# Patient Record
Sex: Male | Born: 1955 | Race: White | Hispanic: No | Smoking: Former smoker
Health system: Southern US, Community
[De-identification: ages and names within clinical notes are randomized; demographics above are authoritative.]

## PROBLEM LIST (undated history)

## (undated) DIAGNOSIS — Z7982 Long term (current) use of aspirin: Secondary | ICD-10-CM

## (undated) DIAGNOSIS — T7840XA Allergy, unspecified, initial encounter: Secondary | ICD-10-CM

## (undated) DIAGNOSIS — I471 Supraventricular tachycardia, unspecified: Secondary | ICD-10-CM

## (undated) DIAGNOSIS — I10 Essential (primary) hypertension: Secondary | ICD-10-CM

## (undated) DIAGNOSIS — K219 Gastro-esophageal reflux disease without esophagitis: Secondary | ICD-10-CM

## (undated) DIAGNOSIS — M7671 Peroneal tendinitis, right leg: Secondary | ICD-10-CM

## (undated) DIAGNOSIS — I2510 Atherosclerotic heart disease of native coronary artery without angina pectoris: Secondary | ICD-10-CM

## (undated) DIAGNOSIS — I700 Atherosclerosis of aorta: Secondary | ICD-10-CM

## (undated) DIAGNOSIS — M5136 Other intervertebral disc degeneration, lumbar region: Secondary | ICD-10-CM

## (undated) DIAGNOSIS — G4733 Obstructive sleep apnea (adult) (pediatric): Secondary | ICD-10-CM

## (undated) DIAGNOSIS — I5030 Unspecified diastolic (congestive) heart failure: Secondary | ICD-10-CM

## (undated) DIAGNOSIS — M1990 Unspecified osteoarthritis, unspecified site: Secondary | ICD-10-CM

## (undated) DIAGNOSIS — G4730 Sleep apnea, unspecified: Secondary | ICD-10-CM

## (undated) DIAGNOSIS — N400 Enlarged prostate without lower urinary tract symptoms: Secondary | ICD-10-CM

## (undated) DIAGNOSIS — M961 Postlaminectomy syndrome, not elsewhere classified: Secondary | ICD-10-CM

## (undated) DIAGNOSIS — E119 Type 2 diabetes mellitus without complications: Secondary | ICD-10-CM

## (undated) DIAGNOSIS — M21372 Foot drop, left foot: Secondary | ICD-10-CM

## (undated) DIAGNOSIS — J449 Chronic obstructive pulmonary disease, unspecified: Secondary | ICD-10-CM

## (undated) DIAGNOSIS — R0600 Dyspnea, unspecified: Secondary | ICD-10-CM

## (undated) DIAGNOSIS — M51369 Other intervertebral disc degeneration, lumbar region without mention of lumbar back pain or lower extremity pain: Secondary | ICD-10-CM

## (undated) DIAGNOSIS — I209 Angina pectoris, unspecified: Secondary | ICD-10-CM

## (undated) DIAGNOSIS — F419 Anxiety disorder, unspecified: Secondary | ICD-10-CM

## (undated) DIAGNOSIS — E7800 Pure hypercholesterolemia, unspecified: Secondary | ICD-10-CM

## (undated) DIAGNOSIS — I509 Heart failure, unspecified: Secondary | ICD-10-CM

## (undated) DIAGNOSIS — G709 Myoneural disorder, unspecified: Secondary | ICD-10-CM

## (undated) DIAGNOSIS — Z951 Presence of aortocoronary bypass graft: Secondary | ICD-10-CM

## (undated) HISTORY — DX: Atherosclerotic heart disease of native coronary artery without angina pectoris: I25.10

## (undated) HISTORY — PX: CARDIAC CATHETERIZATION: SHX172

## (undated) HISTORY — PX: FRACTURE SURGERY: SHX138

## (undated) HISTORY — PX: CHOLECYSTECTOMY: SHX55

## (undated) HISTORY — PX: APPENDECTOMY: SHX54

## (undated) HISTORY — DX: Chronic obstructive pulmonary disease, unspecified: J44.9

## (undated) HISTORY — DX: Unspecified osteoarthritis, unspecified site: M19.90

## (undated) HISTORY — DX: Heart failure, unspecified: I50.9

## (undated) HISTORY — PX: LAMINECTOMY: SHX219

## (undated) HISTORY — PX: KNEE SURGERY: SHX244

## (undated) HISTORY — PX: CARPAL TUNNEL RELEASE: SHX101

## (undated) HISTORY — PX: VASECTOMY: SHX75

## (undated) HISTORY — PX: OTHER SURGICAL HISTORY: SHX169

## (undated) HISTORY — PX: SPINE SURGERY: SHX786

## (undated) HISTORY — DX: Myoneural disorder, unspecified: G70.9

## (undated) HISTORY — DX: Allergy, unspecified, initial encounter: T78.40XA

## (undated) HISTORY — DX: Gastro-esophageal reflux disease without esophagitis: K21.9

## (undated) HISTORY — DX: Sleep apnea, unspecified: G47.30

## (undated) HISTORY — DX: Type 2 diabetes mellitus without complications: E11.9

---

## 2008-06-15 DIAGNOSIS — I2510 Atherosclerotic heart disease of native coronary artery without angina pectoris: Secondary | ICD-10-CM

## 2008-06-15 HISTORY — DX: Atherosclerotic heart disease of native coronary artery without angina pectoris: I25.10

## 2008-06-15 HISTORY — PX: CORONARY STENT PLACEMENT: SHX1402

## 2014-06-15 DIAGNOSIS — I214 Non-ST elevation (NSTEMI) myocardial infarction: Secondary | ICD-10-CM

## 2014-06-15 HISTORY — DX: Non-ST elevation (NSTEMI) myocardial infarction: I21.4

## 2015-04-21 DIAGNOSIS — Z951 Presence of aortocoronary bypass graft: Secondary | ICD-10-CM

## 2015-04-21 HISTORY — PX: CORONARY ARTERY BYPASS GRAFT: SHX141

## 2015-04-21 HISTORY — DX: Presence of aortocoronary bypass graft: Z95.1

## 2015-05-30 ENCOUNTER — Encounter: Payer: Self-pay | Admitting: Emergency Medicine

## 2015-05-30 ENCOUNTER — Emergency Department
Admission: EM | Admit: 2015-05-30 | Discharge: 2015-05-31 | Disposition: A | Discharge status: Home / Self Care | Attending: Emergency Medicine | Admitting: Emergency Medicine

## 2015-05-30 DIAGNOSIS — R202 Paresthesia of skin: Secondary | ICD-10-CM | POA: Insufficient documentation

## 2015-05-30 DIAGNOSIS — Z87891 Personal history of nicotine dependence: Secondary | ICD-10-CM | POA: Insufficient documentation

## 2015-05-30 DIAGNOSIS — I10 Essential (primary) hypertension: Secondary | ICD-10-CM | POA: Insufficient documentation

## 2015-05-30 HISTORY — DX: Pure hypercholesterolemia, unspecified: E78.00

## 2015-05-30 HISTORY — DX: Essential (primary) hypertension: I10

## 2015-05-30 HISTORY — DX: Presence of aortocoronary bypass graft: Z95.1

## 2015-05-30 NOTE — ED Notes (Signed)
Pt had bypass 6 weeks ago and had vein grafted from right leg.  For 3 weeks has had numbness to right leg from right groin to right knee.

## 2015-05-30 NOTE — ED Notes (Signed)
MD Brown at bedside.

## 2015-05-30 NOTE — ED Provider Notes (Signed)
[facility] Emergency Department Provider Note  ____________________________________________  Time seen: 11:20 PM  I have reviewed the triage vital signs and the nursing notes.   HISTORY  Chief Complaint Numbness      HPI Jonathon Newman is a 59 y.o. male presents with history of coronary artery bypass graft with right saphenous vein harvesting proximally 6 weeks ago. Patient states for the past 3 weeks she's had numbness and tingling sensation to his right lateral/distal thigh extending to groin. Patient denies any swelling.     Past Medical History  Diagnosis Date  . Hypertension   . Hypercholesteremia   . S/P coronary artery bypass graft x 2     There are no active problems to display for this patient.   Past Surgical History  Procedure Laterality Date  . Cardiac catheterization    . Appendectomy    . Cholecystectomy    . Coronary stent placement    . Knee surgery      No current outpatient prescriptions on file.  Allergies No known drug allergies No family history on file.  Social History Social History  Substance Use Topics  . Smoking status: Former Games developermoker  . Smokeless tobacco: None  . Alcohol Use: No    Review of Systems  Constitutional: Negative for fever. Eyes: Negative for visual changes. ENT: Negative for sore throat. Cardiovascular: Negative for chest pain. Respiratory: Negative for shortness of breath. Gastrointestinal: Negative for abdominal pain, vomiting and diarrhea. Genitourinary: Negative for dysuria. Musculoskeletal: Negative for back pain. Skin: Negative for rash. Neurological: Negative for headaches, focal weakness. Positive for right-sided thigh numbness and tingling   10-point ROS otherwise negative.  ____________________________________________   PHYSICAL EXAM:  VITAL SIGNS: ED Triage Vitals  Enc Vitals Group     BP 05/30/15 2218 162/93 mmHg     Pulse Rate 05/30/15 2218 70     Resp  05/30/15 2218 18     Temp 05/30/15 2218 98.3 F (36.8 C)     Temp Source 05/30/15 2218 Oral     SpO2 05/30/15 2218 97 %     Weight 05/30/15 2218 185 lb (83.915 kg)     Height --      Head Cir --      Peak Flow --      Pain Score 05/30/15 2218 7     Pain Loc --      Pain Edu? --      Excl. in GC? --      Constitutional: Alert and oriented. Well appearing and in no distress. Eyes: Conjunctivae are normal. PERRL. Normal extraocular movements. ENT   Head: Normocephalic and atraumatic.   Nose: No congestion/rhinnorhea.   Mouth/Throat: Mucous membranes are moist.   Neck: No stridor. Hematological/Lymphatic/Immunilogical: No cervical lymphadenopathy. Cardiovascular: Normal rate, regular rhythm. Normal and symmetric distal pulses are present in all extremities. No murmurs, rubs, or gallops. Respiratory: Normal respiratory effort without tachypnea nor retractions. Breath sounds are clear and equal bilaterally. No wheezes/rales/rhonchi. Gastrointestinal: Soft and nontender. No distention. There is no CVA tenderness. Genitourinary: deferred Musculoskeletal: Nontender with normal range of motion in all extremities. No joint effusions.  No lower extremity tenderness nor edema. Neurologic:  Normal speech and language. Numbness anterior distal and lateral right thigh  Skin:  Skin is warm, dry and intact. No rash noted.       RADIOLOGY  US Venous Img Lower Unilateral Right (Final result) Result time: 05/31/15 01:01:18   Final result by Rad Results In Interface (05/31/15  01:01:18)   Narrative:   CLINICAL DATA: Persistent right leg pain and paresthesias, status post saphenous vein harvesting 6 weeks ago. Initial encounter.  EXAM: RIGHT LOWER EXTREMITY VENOUS DOPPLER ULTRASOUND  TECHNIQUE: Gray-scale sonography with graded compression, as well as color Doppler and duplex ultrasound were performed to evaluate the lower extremity deep venous systems from the level of the  common femoral vein and including the common femoral, femoral, profunda femoral, popliteal and calf veins including the posterior tibial, peroneal and gastrocnemius veins when visible. The superficial great saphenous vein was also interrogated. Spectral Doppler was utilized to evaluate flow at rest and with distal augmentation maneuvers in the common femoral, femoral and popliteal veins.  COMPARISON: None.  FINDINGS: Contralateral Common Femoral Vein: Respiratory phasicity is normal and symmetric with the symptomatic side. No evidence of thrombus. Normal compressibility.  Common Femoral Vein: No evidence of thrombus. Normal compressibility, respiratory phasicity and response to augmentation.  Saphenofemoral Junction: No evidence of thrombus. Normal compressibility and flow on color Doppler imaging.  Profunda Femoral Vein: No evidence of thrombus. Normal compressibility and flow on color Doppler imaging.  Femoral Vein: No evidence of thrombus. Normal compressibility, respiratory phasicity and response to augmentation.  Popliteal Vein: No evidence of thrombus. Normal compressibility, respiratory phasicity and response to augmentation.  Calf Veins: No evidence of thrombus. Normal compressibility and flow on color Doppler imaging.  Venous Reflux: None.  Other Findings: None.  IMPRESSION: No evidence of deep venous thrombosis.   Electronically Signed By: Roanna Raider M.D. On: 05/31/2015 01:01         INITIAL IMPRESSION / ASSESSMENT AND PLAN / ED COURSE  Pertinent labs & imaging results that were available during my care of the patient were reviewed by me and considered in my medical decision making (see chart for details).  History of physical exam concerning for possible neurological etiology versus DVT. Ultrasound venous study of the lower extremity revealed no DVT hence paresthesias most likely secondary to neurological  injury  ____________________________________________   FINAL CLINICAL IMPRESSION(S) / ED DIAGNOSES  Final diagnoses:  Right leg paresthesias      Darci Current, MD 05/31/15 0140

## 2015-05-31 ENCOUNTER — Emergency Department

## 2015-05-31 MED ORDER — ZOLPIDEM TARTRATE 5 MG PO TABS
10.0000 mg | ORAL_TABLET | Freq: Every evening | ORAL | Status: DC | PRN
  Administered 2015-05-31: 10 mg via ORAL
  Filled 2015-05-31: qty 2

## 2015-05-31 NOTE — ED Notes (Signed)
Patient back from Ultrasound.

## 2015-05-31 NOTE — ED Notes (Signed)
Patient transported to Ultrasound 

## 2015-05-31 NOTE — ED Notes (Signed)
Patient with no complaints at this time. Respirations even and unlabored. Skin warm/dry. Discharge instructions reviewed with patient at this time. Patient given opportunity to voice concerns/ask questions. Patient discharged at this time and left Emergency Department with steady gait.   

## 2015-05-31 NOTE — Discharge Instructions (Signed)
Paresthesia Paresthesia is an abnormal burning or prickling sensation. This sensation is generally felt in the hands, arms, legs, or feet. However, it may occur in any part of the body. Usually, it is not painful. The feeling may be described as:  Tingling or numbness.  Pins and needles.  Skin crawling.  Buzzing.  Limbs falling asleep.  Itching. Most people experience temporary (transient) paresthesia at some time in their lives. Paresthesia may occur when you breathe too quickly (hyperventilation). It can also occur without any apparent cause. Commonly, paresthesia occurs when pressure is placed on a nerve. The sensation quickly goes away after the pressure is removed. For some people, however, paresthesia is a long-lasting (chronic) condition that is caused by an underlying disorder. If you continue to have paresthesia, you may need further medical evaluation. HOME CARE INSTRUCTIONS Watch your condition for any changes. Taking the following actions may help to lessen any discomfort that you are feeling:  Avoid drinking alcohol.  Try acupuncture or massage to help relieve your symptoms.  Keep all follow-up visits as directed by your health care provider. This is important. SEEK MEDICAL CARE IF:  You continue to have episodes of paresthesia.  Your burning or prickling feeling gets worse when you walk.  You have pain, cramps, or dizziness.  You develop a rash. SEEK IMMEDIATE MEDICAL CARE IF:  You feel weak.  You have trouble walking or moving.  You have problems with speech, understanding, or vision.  You feel confused.  You cannot control your bladder or bowel movements.  You have numbness after an injury.  You faint.   This information is not intended to replace advice given to you by your health care provider. Make sure you discuss any questions you have with your health care provider.   Document Released: 05/22/2002 Document Revised: 10/16/2014 Document Reviewed:  05/28/2014 Elsevier Interactive Patient Education 2016 [employer]  

## 2015-07-31 ENCOUNTER — Encounter: Payer: No Typology Code available for payment source | Attending: Internal Medicine | Admitting: *Deleted

## 2015-07-31 VITALS — Ht 67.0 in | Wt 205.0 lb

## 2015-07-31 DIAGNOSIS — E1159 Type 2 diabetes mellitus with other circulatory complications: Secondary | ICD-10-CM | POA: Insufficient documentation

## 2015-07-31 DIAGNOSIS — E785 Hyperlipidemia, unspecified: Secondary | ICD-10-CM | POA: Diagnosis not present

## 2015-07-31 DIAGNOSIS — I25119 Atherosclerotic heart disease of native coronary artery with unspecified angina pectoris: Secondary | ICD-10-CM | POA: Insufficient documentation

## 2015-07-31 DIAGNOSIS — Z951 Presence of aortocoronary bypass graft: Secondary | ICD-10-CM | POA: Insufficient documentation

## 2015-07-31 DIAGNOSIS — Z87891 Personal history of nicotine dependence: Secondary | ICD-10-CM | POA: Diagnosis not present

## 2015-07-31 DIAGNOSIS — G4733 Obstructive sleep apnea (adult) (pediatric): Secondary | ICD-10-CM | POA: Insufficient documentation

## 2015-07-31 DIAGNOSIS — I10 Essential (primary) hypertension: Secondary | ICD-10-CM | POA: Diagnosis not present

## 2015-07-31 NOTE — Patient Instructions (Signed)
Patient Instructions  Patient Details  Name: Jonathon Newman MRN: [id number] Date of Birth: August 01, 1955 Referring Provider:  [facility]  Below are the personal goals you chose as well as exercise and nutrition goals. Our goal is to help you keep on track towards obtaining and maintaining your goals. We will be discussing your progress on these goals with you throughout the program.  Initial Exercise Prescription:     Initial Exercise Prescription - 07/31/15 1500    Date of Initial Exercise Prescription   Date 07/31/15   Treadmill   MPH 2.8   Grade 0   Minutes 10   Bike   Level 0.4   Watts 15   Minutes 15   Recumbant Bike   Level 4   RPM 40   Watts 35   Minutes 15   NuStep   Level 3   Watts 40   Minutes 15   Arm Ergometer   Level 1   Watts 8   Minutes 10   Arm/Foot Ergometer   Level 4   Watts 12   Minutes 10   Cybex   Level 3   RPM 50   Minutes 15   Recumbant Elliptical   Level 2   RPM 40   Watts 15   Minutes 15   Elliptical   Level 1   Speed 3   Minutes 1   REL-XR   Level 3   Watts 45   Minutes 15   T5 Nustep   Level 2   Watts 20   Minutes 15   Biostep-RELP   Level 3   Watts 20   Minutes 15   Prescription Details   Frequency (times per week) 3   Duration Progress to 30 minutes of continuous aerobic without signs/symptoms of physical distress   Intensity   THRR REST +  30   Ratings of Perceived Exertion 11-15   Progression Continue progressive overload as per policy without signs/symptoms or physical distress.   Resistance Training   Training Prescription Yes   Weight 3   Reps 10-15      Exercise Goals: Frequency: Be able to perform aerobic exercise three times per week working toward 3-5 days per week.  Intensity: Work with a perceived exertion of 11 (fairly light) - 15 (hard) as tolerated. Follow your new exercise prescription and watch for changes in prescription as you progress with the program. Changes will be reviewed  with you when they are made.  Duration: You should be able to do 30 minutes of continuous aerobic exercise in addition to a 5 minute warm-up and a 5 minute cool-down routine.  Nutrition Goals: Your personal nutrition goals will be established when you do your nutrition analysis with the dietician.  The following are nutrition guidelines to follow: Cholesterol < /day Sodium < /day Fiber: Men over 50 yrs - 30 grams per day  Personal Goals:     Personal Goals and Risk Factors at Admission - 07/31/15 1815    Personal Goals and Risk Factors on Admission    Weight Management Yes   Intervention Learn and follow the exercise and diet guidelines while in the program. Utilize the nutrition and education classes to help gain knowledge of the diet and exercise expectations in the program   Admit Weight 205 lb (92.987 kg)   Goal Weight 165 lb (74.844 kg)   Increase Aerobic Exercise and Physical Activity Yes   Intervention While in program, learn and follow the exercise  prescription taught. Start at a low level workload and increase workload after able to maintain previous level for 30 minutes. Increase time before increasing intensity.   Take Less Medication Yes   Intervention Learn your risk factors and begin the lifestyle modifications for risk factor control during your time in the program.   Diabetes No   Hypertension Yes   Goal Participant will see blood pressure controlled within the values of 140/32mm/Hg or within value directed by their physician.   Intervention Provide nutrition & aerobic exercise along with prescribed medications to achieve BP 140/90 or less.   Lipids Yes   Goal Cholesterol controlled with medications as prescribed, with individualized exercise RX and with personalized nutrition plan. Value goals: LDL < , HDL > . Participant states understanding of desired cholesterol values and following prescriptions.   Intervention Provide nutrition & aerobic  exercise along with prescribed medications to achieve LDL 70mg , HDL >40mg .   Stress Yes   Goal To meet with psychosocial counselor for stress and relaxation information and guidance. To state understanding of performing relaxation techniques and or identifying personal stressors.   Intervention Provide education on types of stress, identifiying stressors, and ways to cope with stress. Provide demonstration and active practice of relaxation techniques.      Tobacco Use Initial Evaluation: History  Smoking status  . Former Smoker  Smokeless tobacco  . Not on file    Copy of goals given to participant.

## 2015-07-31 NOTE — Progress Notes (Signed)
Cardiac Individual Treatment Plan  Patient Details  Name: Jonathon Newman MRN: [id number] Date of Birth: 14-May-1956 Referring Provider:  [facility]  Initial Encounter Date: Date: 07/31/15  Visit Diagnosis: S/P CABG x 2  Benign essential HTN  Hyperlipidemia  OSA (obstructive sleep apnea)  Patient's Home Medications on Admission:  Current outpatient prescriptions:  .  aspirin 81 MG tablet, Take 81 mg by mouth daily., Disp: , Rfl:  .  atorvastatin (LIPITOR) 80 MG tablet, Take 80 mg by mouth daily., Disp: , Rfl:  .  carvedilol (COREG) 6.25 MG tablet, Take 6.25 mg by mouth 2 (two) times daily with a meal., Disp: , Rfl:  .  cyclobenzaprine (FLEXERIL) 10 MG tablet, Take 10 mg by mouth 3 (three) times daily as needed for muscle spasms., Disp: , Rfl:  .  docusate sodium (COLACE) 100 MG capsule, Take 100 mg by mouth 2 (two) times daily., Disp: , Rfl:  .  furosemide (LASIX) 20 MG tablet, Take 20 mg by mouth 3 (three) times daily., Disp: , Rfl:  .  gabapentin (NEURONTIN) 300 MG capsule, Take 300 mg by mouth 3 (three) times daily., Disp: , Rfl:  .  HYDROcodone-acetaminophen (NORCO) 10-325 MG tablet, Take 1 tablet by mouth every 6 (six) hours as needed., Disp: , Rfl:  .  losartan (COZAAR) 50 MG tablet, Take 50 mg by mouth daily., Disp: , Rfl:  .  pantoprazole (PROTONIX) 20 MG tablet, Take 20 mg by mouth daily., Disp: , Rfl:   Past Medical History: Past Medical History  Diagnosis Date  . Hypertension   . Hypercholesteremia   . S/P coronary artery bypass graft x 2     Tobacco Use: History  Smoking status  . Former Smoker  Smokeless tobacco  . Not on file    Labs: Recent Review Flowsheet Data    There is no flowsheet data to display.       Exercise Target Goals: Date: 07/31/15  Exercise Program Goal: Individual exercise prescription set with THRR, safety & activity barriers. Participant demonstrates ability to understand and report RPE using BORG scale, to  self-measure pulse accurately, and to acknowledge the importance of the exercise prescription.  Exercise Prescription Goal: Starting with aerobic activity 30 plus minutes a day, 3 days per week for initial exercise prescription. Provide home exercise prescription and guidelines that participant acknowledges understanding prior to discharge.  Activity Barriers & Risk Stratification:     Activity Barriers & Cardiac Risk Stratification - 07/31/15 1756    Activity Barriers & Cardiac Risk Stratification   Activity Barriers Arthritis;Back Problems;Joint Problems;Neck/Spine Problems  Jonathon Newman has herniated discs at L3-5; s/p neck surgery for herniated disc in neck; right anterior lateral thigh numbness since CABG; Scheduled for MRI of back in next couple weeks to evaluate if right thigh pain/numbness coming from disc problems    Cardiac Risk Stratification High      6 Minute Walk:     6 Minute Walk      07/31/15 1440       6 Minute Walk   Phase Initial     Distance 1650 feet     Walk Time 6 minutes     # of Rest Breaks 0     MPH 3     RPE 12     Symptoms No     Resting HR 84 bpm     Resting BP 136/86 mmHg     Max Ex. HR 120 bpm     Max Ex.  BP 164/90 mmHg        Initial Exercise Prescription:     Initial Exercise Prescription - 07/31/15 1500    Date of Initial Exercise Prescription   Date 07/31/15   Treadmill   MPH 2.8   Grade 0   Minutes 10   Bike   Level 0.4   Watts 15   Minutes 15   Recumbant Bike   Level 4   RPM 40   Watts 35   Minutes 15   NuStep   Level 3   Watts 40   Minutes 15   Arm Ergometer   Level 1   Watts 8   Minutes 10   Arm/Foot Ergometer   Level 4   Watts 12   Minutes 10   Cybex   Level 3   RPM 50   Minutes 15   Recumbant Elliptical   Level 2   RPM 40   Watts 15   Minutes 15   Elliptical   Level 1   Speed 3   Minutes 1   REL-XR   Level 3   Watts 45   Minutes 15   T5 Nustep   Level 2   Watts 20   Minutes 15   Biostep-RELP    Level 3   Watts 20   Minutes 15   Prescription Details   Frequency (times per week) 3   Duration Progress to 30 minutes of continuous aerobic without signs/symptoms of physical distress   Intensity   THRR REST +  30   Ratings of Perceived Exertion 11-15   Progression Continue progressive overload as per policy without signs/symptoms or physical distress.   Resistance Training   Training Prescription Yes   Weight 3   Reps 10-15      Exercise Prescription Changes:   Discharge Exercise Prescription (Final Exercise Prescription Changes):   Nutrition:  Target Goals: Understanding of nutrition guidelines, daily intake of sodium 1500mg , cholesterol 200mg , calories 30% from fat and 7% or less from saturated fats, daily to have 5 or more servings of fruits and vegetables.  Biometrics:     Pre Biometrics - 07/31/15 1439    Pre Biometrics   Height  (1.702 m)   Weight 205 lb (92.987 kg)   Waist Circumference 40.5 inches   Hip Circumference 40.5 inches   Waist to Hip Ratio 1 %   BMI (Calculated) 32.2       Nutrition Therapy Plan and Nutrition Goals:   Nutrition Discharge: Rate Your Plate Scores:   Nutrition Goals Re-Evaluation:   Psychosocial: Target Goals: Acknowledge presence or absence of depression, maximize coping skills, provide positive support system. Participant is able to verbalize types and ability to use techniques and skills needed for reducing stress and depression.  Initial Review & Psychosocial Screening:     Initial Psych Review & Screening - 07/31/15 1817    Initial Review   Current issues with Current Depression;Current Sleep Concerns;Current Stress Concerns   Comments Jonathon Newman informed nurse that he has been having anxiety issues (had bypass surgery in November 2016). Jonathon Newman described a couple of episodes where he became really anxious and on edge and he needed to be alone during those times; Jonathon Newman's mother passed away 3 weeks ago;    Family  Dynamics   Good Support System? No   Comments reports having a close friend.  Jonathon Newman's mother passed away 3 weeks ago; has 2 grown children and 8 grandkids and will soon have a great  grandchild.  Jonathon Newman states he has never been on any medication for anxiety or depression.  In addition, prior to his heart bypass, Jonathon Newman was exercising 3 days a week for 2 hours prior to having heart  bypass.  Jonathon Newman states he has gained weight since his bypass surgery.     Barriers   Psychosocial barriers to participate in program The patient should benefit from training in stress management and relaxation.   Screening Interventions   Interventions Program counselor consult;Encouraged to exercise      Quality of Life Scores:     Quality of Life - 07/31/15 1826    Quality of Life Scores   Health/Function Pre 21.57 %   Socioeconomic Pre 18.57 %   Psych/Spiritual Pre 23.36 %   Family Pre 19.2 %   GLOBAL Pre 20.97 %      PHQ-9:     Recent Review Flowsheet Data    Depression screen [facility] 2/9 07/31/2015   Decreased Interest 2   Down, Depressed, Hopeless 2   PHQ - 2 Score 4   Altered sleeping 2   Tired, decreased energy 1   Change in appetite 0   Feeling bad or failure about yourself  1   Trouble concentrating 0   Moving slowly or fidgety/restless 0   Suicidal thoughts 0   PHQ-9 Score 8   Difficult doing work/chores Not difficult at all      Psychosocial Evaluation and Intervention:   Psychosocial Re-Evaluation:   Vocational Rehabilitation: Provide vocational rehab assistance to qualifying candidates.   Vocational Rehab Evaluation & Intervention:     Vocational Rehab - 07/31/15 1803    Initial Vocational Rehab Evaluation & Intervention   Assessment shows need for Vocational Rehabilitation No      Education: Education Goals: Education classes will be provided on a weekly basis, covering required topics. Participant will state understanding/return demonstration of topics  presented.  Learning Barriers/Preferences:     Learning Barriers/Preferences - 07/31/15 1800    Learning Barriers/Preferences   Learning Barriers None   Learning Preferences Written Material;Video;Group Instruction      Education Topics: General Nutrition Guidelines/Fats and Fiber: -Group instruction provided by verbal, written material, models and posters to present the general guidelines for heart healthy nutrition. Gives an explanation and review of dietary fats and fiber.   Controlling Sodium/Reading Food Labels: -Group verbal and written material supporting the discussion of sodium use in heart healthy nutrition. Review and explanation with models, verbal and written materials for utilization of the food label.   Exercise Physiology & Risk Factors: - Group verbal and written instruction with models to review the exercise physiology of the cardiovascular system and associated critical values. Details cardiovascular disease risk factors and the goals associated with each risk factor.   Aerobic Exercise & Resistance Training: - Gives group verbal and written discussion on the health impact of inactivity. On the components of aerobic and resistive training programs and the benefits of this training and how to safely progress through these programs.   Flexibility, Balance, General Exercise Guidelines: - Provides group verbal and written instruction on the benefits of flexibility and balance training programs. Provides general exercise guidelines with specific guidelines to those with heart or lung disease. Demonstration and skill practice provided.   Stress Management: - Provides group verbal and written instruction about the health risks of elevated stress, cause of high stress, and healthy ways to reduce stress.   Depression: - Provides group verbal and written instruction on the correlation  between heart/lung disease and depressed mood, treatment options, and the stigmas  associated with seeking treatment.   Anatomy & Physiology of the Heart: - Group verbal and written instruction and models provide basic cardiac anatomy and physiology, with the coronary electrical and arterial systems. Review of: AMI, Angina, Valve disease, Heart Failure, Cardiac Arrhythmia, Pacemakers, and the ICD.   Cardiac Procedures: - Group verbal and written instruction and models to describe the testing methods done to diagnose heart disease. Reviews the outcomes of the test results. Describes the treatment choices: Medical Management, Angioplasty, or Coronary Bypass Surgery.   Cardiac Medications: - Group verbal and written instruction to review commonly prescribed medications for heart disease. Reviews the medication, class of the drug, and side effects. Includes the steps to properly store meds and maintain the prescription regimen.   Go Sex-Intimacy & Heart Disease, Get SMART - Goal Setting: - Group verbal and written instruction through game format to discuss heart disease and the return to sexual intimacy. Provides group verbal and written material to discuss and apply goal setting through the application of the S.M.A.R.T. Method.   Other Matters of the Heart: - Provides group verbal, written materials and models to describe Heart Failure, Angina, Valve Disease, and Diabetes in the realm of heart disease. Includes description of the disease process and treatment options available to the cardiac patient.   Exercise & Equipment Safety: - Individual verbal instruction and demonstration of equipment use and safety with use of the equipment.          Cardiac Rehab from 07/31/2015 in [facility] Cardiac [facility] Pul[facility] Rehab   Date  07/31/15   Educator  D. Delford Field, RN   Instruction Review Code  1- partially meets, needs review/practice      Infection Prevention: - Provides verbal and written material to individual with discussion of infection control including proper hand washing and  proper equipment cleaning during exercise session.      Cardiac Rehab from 07/31/2015 in [facility] Cardiac and Pulmonary Rehab   Date  07/31/15   Educator  D. Delford Field, RN   Instruction Review Code  2- meets goals/outcomes      Falls Prevention: - Provides verbal and written material to individual with discussion of falls prevention and safety.      Cardiac Rehab from 07/31/2015 in [facility] Cardiac and Pulmonary Rehab   Date  07/31/15   Educator  D. Delford Field, RN   Instruction Review Code  2- meets goals/outcomes      Diabetes: - Individual verbal and written instruction to review signs/symptoms of diabetes, desired ranges of glucose level fasting, after meals and with exercise. Advice that pre and post exercise glucose checks will be done for 3 sessions at entry of program.    Knowledge Questionnaire Score:     Knowledge Questionnaire Score - 07/31/15 1800    Knowledge Questionnaire Score   Pre Score 24/28      Personal Goals and Risk Factors at Admission:     Personal Goals and Risk Factors at Admission - 07/31/15 1815    Personal Goals and Risk Factors on Admission    Weight Management Yes   Intervention Learn and follow the exercise and diet guidelines while in the program. Utilize the nutrition and education classes to help gain knowledge of the diet and exercise expectations in the program   Admit Weight 205 lb (92.987 kg)   Goal Weight 165 lb (74.844 kg)   Increase Aerobic Exercise and Physical Activity Yes   Intervention  While in program, learn and follow the exercise prescription taught. Start at a low level workload and increase workload after able to maintain previous level for 30 minutes. Increase time before increasing intensity.   Take Less Medication Yes   Intervention Learn your risk factors and begin the lifestyle modifications for risk factor control during your time in the program.   Diabetes No   Hypertension Yes   Goal Participant will see blood pressure controlled  within the values of 140/63mm/Hg or within value directed by their physician.   Intervention Provide nutrition & aerobic exercise along with prescribed medications to achieve BP 140/90 or less.   Lipids Yes   Goal Cholesterol controlled with medications as prescribed, with individualized exercise RX and with personalized nutrition plan. Value goals: LDL < , HDL > . Participant states understanding of desired cholesterol values and following prescriptions.   Intervention Provide nutrition & aerobic exercise along with prescribed medications to achieve LDL 70mg , HDL >40mg .   Stress Yes   Goal To meet with psychosocial counselor for stress and relaxation information and guidance. To state understanding of performing relaxation techniques and or identifying personal stressors.   Intervention Provide education on types of stress, identifiying stressors, and ways to cope with stress. Provide demonstration and active practice of relaxation techniques.      Personal Goals and Risk Factors Review:    Personal Goals Discharge (Final Personal Goals and Risk Factors Review):    ITP Comments:   Comments:  [facility] will be starting Cardiac Rehab on Monday, August 05, 2015 at 3:45 p.m.

## 2015-08-05 DIAGNOSIS — Z951 Presence of aortocoronary bypass graft: Secondary | ICD-10-CM

## 2015-08-05 NOTE — Progress Notes (Signed)
Daily Session Note  Patient Details  Name: Hieu Herms MRN: [id number] Date of Birth: 03-22-1956 Referring Provider:  No ref. provider found  Encounter Date: 08/05/2015  Check In:     Session Check In - 08/05/15 1612    Check-In   Location [facility]-Cardiac & Pulmonary Rehab   Staff Present Heath Lark, RN, BSN, CCRP;Paton Crum, BS, ACSM EP-C, Exercise Physiologist;Rebecca Sickles, PT   Supervising physician immediately available to respond to emergencies See telemetry face sheet for immediately available ER MD   Medication changes reported     No   Fall or balance concerns reported    No   Warm-up and Cool-down Performed on first and last piece of equipment   Resistance Training Performed No   Pain Assessment   Currently in Pain? No/denies         Goals Met:  Proper associated with RPD/PD & O2 Sat Exercise tolerated well No report of cardiac concerns or symptoms Strength training completed today  Goals Unmet:  Not Applicable  Goals Comments:    Dr. Emily Filbert is Medical Director for [place] and [facility].

## 2015-08-06 NOTE — Progress Notes (Signed)
Cardiac Individual Treatment Plan  Patient Details  Name: Jonathon Newman MRN: [id number] Date of Birth: [birth year] Referring Provider:  No ref. provider found  Initial Encounter Date:    Visit Diagnosis: S/P CABG x 2  Patient's Home Medications on Admission:  Current outpatient prescriptions:  .  aspirin 81 MG tablet, Take 81 mg by mouth daily., Disp: , Rfl:  .  atorvastatin (LIPITOR) 80 MG tablet, Take 80 mg by mouth daily., Disp: , Rfl:  .  carvedilol (COREG) 6.25 MG tablet, Take 6.25 mg by mouth 2 (two) times daily with a meal., Disp: , Rfl:  .  cyclobenzaprine (FLEXERIL) 10 MG tablet, Take 10 mg by mouth 3 (three) times daily as needed for muscle spasms., Disp: , Rfl:  .  docusate sodium (COLACE) 100 MG capsule, Take 100 mg by mouth 2 (two) times daily., Disp: , Rfl:  .  furosemide (LASIX) 20 MG tablet, Take 20 mg by mouth 3 (three) times daily., Disp: , Rfl:  .  gabapentin (NEURONTIN) 300 MG capsule, Take 300 mg by mouth 3 (three) times daily., Disp: , Rfl:  .  HYDROcodone-acetaminophen (NORCO) 10-325 MG tablet, Take 1 tablet by mouth every 6 (six) hours as needed., Disp: , Rfl:  .  losartan (COZAAR) 50 MG tablet, Take 50 mg by mouth daily., Disp: , Rfl:  .  pantoprazole (PROTONIX) 20 MG tablet, Take 20 mg by mouth daily., Disp: , Rfl:   Past Medical History: Past Medical History  Diagnosis Date  . Hypertension   . Hypercholesteremia   . S/P coronary artery bypass graft x 2     Tobacco Use: History  Smoking status  . Former Smoker  Smokeless tobacco  . Not on file    Labs: Recent Review Flowsheet Data    There is no flowsheet data to display.       Exercise Target Goals:    Exercise Program Goal: Individual exercise prescription set with THRR, safety & activity barriers. Participant demonstrates ability to understand and report RPE using BORG scale, to self-measure pulse accurately, and to acknowledge the importance of the exercise prescription.  Exercise  Prescription Goal: Starting with aerobic activity 30 plus minutes a day, 3 days per week for initial exercise prescription. Provide home exercise prescription and guidelines that participant acknowledges understanding prior to discharge.  Activity Barriers & Risk Stratification:     Activity Barriers & Cardiac Risk Stratification - 07/31/15 1756    Activity Barriers & Cardiac Risk Stratification   Activity Barriers Arthritis;Back Problems;Joint Problems;Neck/Spine Problems  Ansh has herniated discs at L3-5; s/p neck surgery for herniated disc in neck; right anterior lateral thigh numbness since CABG; Scheduled for MRI of back in next couple weeks to evaluate if right thigh pain/numbness coming from disc problems    Cardiac Risk Stratification High      6 Minute Walk:     6 Minute Walk      07/31/15 1440       6 Minute Walk   Phase Initial     Distance 1650 feet     Walk Time 6 minutes     # of Rest Breaks 0     MPH 3     RPE 12     Symptoms No     Resting HR 84 bpm     Resting BP 136/86 mmHg     Max Ex. HR 120 bpm     Max Ex. BP 164/90 mmHg        Initial  Exercise Prescription:     Initial Exercise Prescription - 07/31/15 1500    Date of Initial Exercise Prescription   Date 07/31/15   Treadmill   MPH 2.8   Grade 0   Minutes 10   Bike   Level 0.4   Watts 15   Minutes 15   Recumbant Bike   Level 4   RPM 40   Watts 35   Minutes 15   NuStep   Level 3   Watts 40   Minutes 15   Arm Ergometer   Level 1   Watts 8   Minutes 10   Arm/Foot Ergometer   Level 4   Watts 12   Minutes 10   Cybex   Level 3   RPM 50   Minutes 15   Recumbant Elliptical   Level 2   RPM 40   Watts 15   Minutes 15   Elliptical   Level 1   Speed 3   Minutes 1   REL-XR   Level 3   Watts 45   Minutes 15   T5 Nustep   Level 2   Watts 20   Minutes 15   Biostep-RELP   Level 3   Watts 20   Minutes 15   Prescription Details   Frequency (times per week) 3   Duration  Progress to 30 minutes of continuous aerobic without signs/symptoms of physical distress   Intensity   THRR REST +  30   Ratings of Perceived Exertion 11-15   Progression Continue progressive overload as per policy without signs/symptoms or physical distress.   Resistance Training   Training Prescription Yes   Weight 3   Reps 10-15      Exercise Prescription Changes:     Exercise Prescription Changes      08/05/15 1700           Response to Exercise   Comments Patient attended first day of cardiac rehabilitation. Following exercise on treadmill, patient stated felt dizzy.  BP taken in sitting with reading of 128/78.  Given water with dizziness abating.  Resumed exercise on another piece of equipment.  No other adverse effects noted.          Discharge Exercise Prescription (Final Exercise Prescription Changes):     Exercise Prescription Changes - 08/05/15 1700    Response to Exercise   Comments Patient attended first day of cardiac rehabilitation. Following exercise on treadmill, patient stated felt dizzy.  BP taken in sitting with reading of 128/78.  Given water with dizziness abating.  Resumed exercise on another piece of equipment.  No other adverse effects noted.      Nutrition:  Target Goals: Understanding of nutrition guidelines, daily intake of sodium <159m, cholesterol <2016m calories 30% from fat and 7% or less from saturated fats, daily to have 5 or more servings of fruits and vegetables.  Biometrics:     Pre Biometrics - 07/31/15 1439    Pre Biometrics   Height 5' 7" (1.702 m)   Weight 205 lb (92.987 kg)   Waist Circumference 40.5 inches   Hip Circumference 40.5 inches   Waist to Hip Ratio 1 %   BMI (Calculated) 32.2       Nutrition Therapy Plan and Nutrition Goals:   Nutrition Discharge: Rate Your Plate Scores:   Nutrition Goals Re-Evaluation:   Psychosocial: Target Goals: Acknowledge presence or absence of depression, maximize coping  skills, provide positive support system. Participant is able to verbalize types  and ability to use techniques and skills needed for reducing stress and depression.  Initial Review & Psychosocial Screening:     Initial Psych Review & Screening - 07/31/15 1817    Initial Review   Current issues with Current Depression;Current Sleep Concerns;Current Stress Concerns   Comments Shanon Brow informed nurse that he has been having anxiety issues (had bypass surgery in November 2016). Marico described a couple of episodes where he became really anxious and on edge and he needed to be alone during those times; Lovell's mother passed away 3 weeks ago;    Family Dynamics   Good Support System? No   Comments reports having a close friend.  Treven's mother passed away 3 weeks ago; has 2 grown children and 8 grandkids and will soon have a great grandchild.  Avory states he has never been on any medication for anxiety or depression.  In addition, prior to his heart bypass, Cheron was exercising 3 days a week for 2 hours prior to having heart  bypass.  Maisen states he has gained weight since his bypass surgery.     Barriers   Psychosocial barriers to participate in program The patient should benefit from training in stress management and relaxation.   Screening Interventions   Interventions Program counselor consult;Encouraged to exercise      Quality of Life Scores:     Quality of Life - 07/31/15 1826    Quality of Life Scores   Health/Function Pre 21.57 %   Socioeconomic Pre 18.57 %   Psych/Spiritual Pre 23.36 %   Family Pre 19.2 %   GLOBAL Pre 20.97 %      PHQ-9:     Recent Review Flowsheet Data    Depression screen [facility] 2/9 07/31/2015   Decreased Interest 2   Down, Depressed, Hopeless 2   PHQ - 2 Score 4   Altered sleeping 2   Tired, decreased energy 1   Change in appetite 0   Feeling bad or failure about yourself  1   Trouble concentrating 0   Moving slowly or fidgety/restless 0   Suicidal  thoughts 0   PHQ-9 Score 8   Difficult doing work/chores Not difficult at all      Psychosocial Evaluation and Intervention:     Psychosocial Evaluation - 08/05/15 1613    Psychosocial Evaluation & Interventions   Interventions Encouraged to exercise with the program and follow exercise prescription;Stress management education;Relaxation education   Comments Counselor met with Mr. Enck today for initial psychological evaluation.  He is a 60 year old who has a family history of heart disease and had open heart surgery on 04/18/15. 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  Mr. Nickolson has sleep apnea as well as cardiac issues and reports he has intermittent sleep often - although he uses his CPAP nightly.  He denies a history of depression, but has some current symptoms.  Counselor assessed for depression, with Mr. Landgrebe reporting his mother just passed away 3 weeks ago and accompanied by his current health and job stress, it is impacting him at this time.   He reported his current mood as an "8" on a scale of 1-10 with "10" being the best mood at this time.   Mr. Laverdiere has goals to lose weight and get back in shape so he can return to the gym where he was working out typically 3x/week last  summer and early fall.  He is also encouraged to attend the stress management and depression psychoeducational components of this program and meet with the dietician to address his weight loss goals.     Continued Psychosocial Services Needed Yes  Mr. Donoso will benefit from the psychoeducational components of this program.  He also needs to meet with the dietician to address his weight loss goals.       Psychosocial Re-Evaluation:   Vocational Rehabilitation: Provide vocational rehab assistance to qualifying candidates.   Vocational Rehab Evaluation & Intervention:     Vocational Rehab - 07/31/15  1803    Initial Vocational Rehab Evaluation & Intervention   Assessment shows need for Vocational Rehabilitation No      Education: Education Goals: Education classes will be provided on a weekly basis, covering required topics. Participant will state understanding/return demonstration of topics presented.  Learning Barriers/Preferences:     Learning Barriers/Preferences - 07/31/15 1800    Learning Barriers/Preferences   Learning Barriers None   Learning Preferences Written Material;Video;Group Instruction      Education Topics: General Nutrition Guidelines/Fats and Fiber: -Group instruction provided by verbal, written material, models and posters to present the general guidelines for heart healthy nutrition. Gives an explanation and review of dietary fats and fiber.   Controlling Sodium/Reading Food Labels: -Group verbal and written material supporting the discussion of sodium use in heart healthy nutrition. Review and explanation with models, verbal and written materials for utilization of the food label.   Exercise Physiology & Risk Factors: - Group verbal and written instruction with models to review the exercise physiology of the cardiovascular system and associated critical values. Details cardiovascular disease risk factors and the goals associated with each risk factor.   Aerobic Exercise & Resistance Training: - Gives group verbal and written discussion on the health impact of inactivity. On the components of aerobic and resistive training programs and the benefits of this training and how to safely progress through these programs.   Flexibility, Balance, General Exercise Guidelines: - Provides group verbal and written instruction on the benefits of flexibility and balance training programs. Provides general exercise guidelines with specific guidelines to those with heart or lung disease. Demonstration and skill practice provided.   Stress Management: - Provides  group verbal and written instruction about the health risks of elevated stress, cause of high stress, and healthy ways to reduce stress.   Depression: - Provides group verbal and written instruction on the correlation between heart/lung disease and depressed mood, treatment options, and the stigmas associated with seeking treatment.   Anatomy & Physiology of the Heart: - Group verbal and written instruction and models provide basic cardiac anatomy and physiology, with the coronary electrical and arterial systems. Review of: AMI, Angina, Valve disease, Heart Failure, Cardiac Arrhythmia, Pacemakers, and the ICD.   Cardiac Procedures: - Group verbal and written instruction and models to describe the testing methods done to diagnose heart disease. Reviews the outcomes of the test results. Describes the treatment choices: Medical Management, Angioplasty, or Coronary Bypass Surgery.          Cardiac Rehab from 08/05/2015 in [facility] Cardiac and Pulmonary Rehab   Date  08/05/15   Educator  SB   Instruction Review Code  2- meets goals/outcomes      Cardiac Medications: - Group verbal and written instruction to review commonly prescribed medications for heart disease. Reviews the medication, class of the drug, and side effects. Includes the steps to properly store meds and maintain  the prescription regimen.   Go Sex-Intimacy & Heart Disease, Get SMART - Goal Setting: - Group verbal and written instruction through game format to discuss heart disease and the return to sexual intimacy. Provides group verbal and written material to discuss and apply goal setting through the application of the S.M.A.R.T. Method.      Cardiac Rehab from 08/05/2015 in [facility] Cardiac and Pulmonary Rehab   Date  08/05/15   Educator  SB   Instruction Review Code  2- meets goals/outcomes      Other Matters of the Heart: - Provides group verbal, written materials and models to describe Heart Failure, Angina, Valve Disease,  and Diabetes in the realm of heart disease. Includes description of the disease process and treatment options available to the cardiac patient.   Exercise & Equipment Safety: - Individual verbal instruction and demonstration of equipment use and safety with use of the equipment.      Cardiac Rehab from 08/05/2015 in [facility] Cardiac and Pulmonary Rehab   Date  07/31/15   Educator  D. Joya Gaskins, RN   Instruction Review Code  1- partially meets, needs review/practice      Infection Prevention: - Provides verbal and written material to individual with discussion of infection control including proper hand washing and proper equipment cleaning during exercise session.      Cardiac Rehab from 08/05/2015 in [facility] Cardiac and Pulmonary Rehab   Date  07/31/15   Educator  D. Joya Gaskins, RN   Instruction Review Code  2- meets goals/outcomes      Falls Prevention: - Provides verbal and written material to individual with discussion of falls prevention and safety.      Cardiac Rehab from 08/05/2015 in [facility] Cardiac and Pulmonary Rehab   Date  07/31/15   Educator  D. Joya Gaskins, RN   Instruction Review Code  2- meets goals/outcomes      Diabetes: - Individual verbal and written instruction to review signs/symptoms of diabetes, desired ranges of glucose level fasting, after meals and with exercise. Advice that pre and post exercise glucose checks will be done for 3 sessions at entry of program.    Knowledge Questionnaire Score:     Knowledge Questionnaire Score - 07/31/15 1800    Knowledge Questionnaire Score   Pre Score 24/28      Personal Goals and Risk Factors at Admission:     Personal Goals and Risk Factors at Admission - 07/31/15 1815    Personal Goals and Risk Factors on Admission    Weight Management Yes   Intervention Learn and follow the exercise and diet guidelines while in the program. Utilize the nutrition and education classes to help gain knowledge of the diet and exercise expectations  in the program   Admit Weight 205 lb (92.987 kg)   Goal Weight 165 lb (74.844 kg)   Increase Aerobic Exercise and Physical Activity Yes   Intervention While in program, learn and follow the exercise prescription taught. Start at a low level workload and increase workload after able to maintain previous level for 30 minutes. Increase time before increasing intensity.   Take Less Medication Yes   Intervention Learn your risk factors and begin the lifestyle modifications for risk factor control during your time in the program.   Diabetes No   Hypertension Yes   Goal Participant will see blood pressure controlled within the values of 140/8m/Hg or within value directed by their physician.   Intervention Provide nutrition & aerobic exercise along with prescribed medications  to achieve BP 140/90 or less.   Lipids Yes   Goal Cholesterol controlled with medications as prescribed, with individualized exercise RX and with personalized nutrition plan. Value goals: LDL < 36m, HDL > 441m Participant states understanding of desired cholesterol values and following prescriptions.   Intervention Provide nutrition & aerobic exercise along with prescribed medications to achieve LDL <7053mHDL >56m36m Stress Yes   Goal To meet with psychosocial counselor for stress and relaxation information and guidance. To state understanding of performing relaxation techniques and or identifying personal stressors.   Intervention Provide education on types of stress, identifiying stressors, and ways to cope with stress. Provide demonstration and active practice of relaxation techniques.      Personal Goals and Risk Factors Review:    Personal Goals Discharge (Final Personal Goals and Risk Factors Review):    ITP Comments:     ITP Comments      08/06/15 0731           ITP Comments 30 day review.  Continue with ITP  new to program, to this date he has completed orientation and attended one session.  DaviKelvon experiencing mild to severe pain related to the surgery. He will approach the exercises with caution and will not use weignts until he sees some relief with the muscle and chest wall pain.           Comments:

## 2015-08-07 ENCOUNTER — Encounter: Payer: No Typology Code available for payment source | Admitting: *Deleted

## 2015-08-07 ENCOUNTER — Encounter: Payer: Self-pay | Admitting: Dietician

## 2015-08-07 DIAGNOSIS — Z951 Presence of aortocoronary bypass graft: Secondary | ICD-10-CM | POA: Diagnosis not present

## 2015-08-07 NOTE — Progress Notes (Signed)
Daily Session Note  Patient Details  Name: Lang Zingg MRN: [id number] Date of Birth: 16-Feb-1956 Referring Provider:  [facility]  Encounter Date: 08/07/2015  Check In:     Session Check In - 08/07/15 1614    Check-In   Location [facility]-Cardiac & Pulmonary Rehab   Staff Present Gerlene Burdock, RN, Drusilla Kanner, MS, ACSM CEP, Exercise Physiologist;Jaivian Battaglini Joya Gaskins, RN, BSN   Supervising physician immediately available to respond to emergencies See telemetry face sheet for immediately available ER MD   Medication changes reported     No   Fall or balance concerns reported    No   Warm-up and Cool-down Performed on first and last piece of equipment   Resistance Training Performed Yes   VAD Patient? No         Goals Met:  Exercise tolerated well No report of cardiac concerns or symptoms Strength training completed today  Goals Unmet:  Not Applicable  Goals Comments:  Patient completed exercise prescription and all exercise goals during rehab session. The exercise was tolerated well and the patient is progressing in the program.    Dr. Emily Filbert is Medical Director for [place] and [facility].

## 2015-08-07 NOTE — Progress Notes (Signed)
Scored patient's Rate your Plate questionnaire; unable to enter score in documentation encounter at this time; will addend class notes for today.

## 2015-08-12 DIAGNOSIS — Z951 Presence of aortocoronary bypass graft: Secondary | ICD-10-CM | POA: Diagnosis not present

## 2015-08-12 NOTE — Progress Notes (Signed)
Daily Session Note  Patient Details  Name: Thaniel Coluccio MRN: [id number] Date of Birth: 20-Aug-1955 Referring Provider:  No ref. provider found  Encounter Date: 08/12/2015  Check In:     Session Check In - 08/12/15 1644    Check-In   Location [facility]-Cardiac & Pulmonary Rehab   Staff Present Heath Lark, RN, BSN, CCRP;Steven Way, BS, ACSM EP-C, Exercise Physiologist;Neesha Langton, PT   Supervising physician immediately available to respond to emergencies See telemetry face sheet for immediately available ER MD   Medication changes reported     No   Fall or balance concerns reported    No   Warm-up and Cool-down Performed on first and last piece of equipment   Resistance Training Performed Yes   VAD Patient? No         Goals Met:  Independence with exercise equipment Exercise tolerated well No report of cardiac concerns or symptoms  Goals Unmet:  Not Applicable  Comments: Patient completed exercise prescription and all exercise goals during rehab session. The exercise was tolerated well and the patient is progressing in the program.    Dr. Emily Filbert is Medical Director for [place] and [facility].

## 2015-08-14 ENCOUNTER — Encounter: Payer: No Typology Code available for payment source | Attending: Internal Medicine | Admitting: *Deleted

## 2015-08-14 ENCOUNTER — Encounter: Payer: Self-pay | Admitting: Dietician

## 2015-08-14 DIAGNOSIS — E785 Hyperlipidemia, unspecified: Secondary | ICD-10-CM | POA: Diagnosis not present

## 2015-08-14 DIAGNOSIS — I10 Essential (primary) hypertension: Secondary | ICD-10-CM | POA: Insufficient documentation

## 2015-08-14 DIAGNOSIS — G4733 Obstructive sleep apnea (adult) (pediatric): Secondary | ICD-10-CM | POA: Insufficient documentation

## 2015-08-14 DIAGNOSIS — Z951 Presence of aortocoronary bypass graft: Secondary | ICD-10-CM | POA: Insufficient documentation

## 2015-08-14 DIAGNOSIS — Z87891 Personal history of nicotine dependence: Secondary | ICD-10-CM | POA: Insufficient documentation

## 2015-08-14 NOTE — Addendum Note (Signed)
Addended by: Rudy Jew on: 08/14/2015 11:10 AM   Modules accepted: Orders

## 2015-08-14 NOTE — Progress Notes (Signed)
Daily Session Note  Patient Details  Name: Jonathon Newman MRN: [id number] Date of Birth: Jun 17, 1955 Referring Provider:  [facility]  Encounter Date: 08/14/2015  Check In:     Session Check In - 08/14/15 1632    Check-In   Location [facility]-Cardiac & Pulmonary Rehab   Staff Present Nyoka Cowden, RN;Rebecca Sickles, PT;Diane Joya Gaskins, RN, BSN   Supervising physician immediately available to respond to emergencies See telemetry face sheet for immediately available ER MD   Medication changes reported     No   Fall or balance concerns reported    No   Warm-up and Cool-down Performed on first and last piece of equipment   Resistance Training Performed Yes   VAD Patient? No   Pain Assessment   Currently in Pain? No/denies   Pain Score 0-No pain           Exercise Prescription Changes - 08/14/15 1600    Resistance Training   Training Prescription Yes   Weight 5   Reps 10-15   Treadmill   MPH 2.8   Grade 0   Minutes 15   REL-XR   Level 5   Watts 45   Minutes 20      Goals Met:  Independence with exercise equipment Exercise tolerated well Personal goals reviewed No report of cardiac concerns or symptoms Strength training completed today  Goals Unmet:  Not Applicable  Comments:  Patient completed exercise prescription and all exercise goals during rehab session. The exercise was tolerated well and the patient is progressing in the program.    Dr. Emily Filbert is Medical Director for [place] and [facility].

## 2015-08-19 ENCOUNTER — Telehealth: Payer: Self-pay | Admitting: *Deleted

## 2015-08-19 ENCOUNTER — Encounter: Payer: Self-pay | Admitting: *Deleted

## 2015-08-19 DIAGNOSIS — Z951 Presence of aortocoronary bypass graft: Secondary | ICD-10-CM

## 2015-08-19 NOTE — Telephone Encounter (Signed)
Jonathon HuaDavid left a vm today that he will not be able to attend Cardiac Rehab this evening but hopes to return on Wednesday.

## 2015-08-21 ENCOUNTER — Encounter: Payer: No Typology Code available for payment source | Admitting: *Deleted

## 2015-08-21 DIAGNOSIS — Z951 Presence of aortocoronary bypass graft: Secondary | ICD-10-CM

## 2015-08-21 NOTE — Progress Notes (Signed)
Daily Session Note  Patient Details  Name: Jonathon Newman MRN: [id number] Date of Birth: 1956-04-19 Referring Provider:  [facility] [place] [facility]  Encounter Date: 08/21/2015  Check In:     Session Check In - 08/21/15 1624    Check-In   Location [facility]-Cardiac & Pulmonary Rehab   Staff Present Gerlene Burdock, RN, Drusilla Kanner, MS, ACSM CEP, Exercise Physiologist;Savaughn Karwowski Joya Gaskins, RN, BSN   Supervising physician immediately available to respond to emergencies See telemetry face sheet for immediately available ER MD   Medication changes reported     No   Fall or balance concerns reported    No   Warm-up and Cool-down Performed on first and last piece of equipment   Resistance Training Performed Yes   Pain Assessment   Currently in Pain? No/denies         Goals Met:  Independence with exercise equipment Exercise tolerated well No report of cardiac concerns or symptoms Strength training completed today  Goals Unmet:  Not Applicable  Comments:  Patient completed exercise prescription and all exercise goals during rehab session. The exercise was tolerated well and the patient is progressing in the program.      Dr. Emily Filbert is Medical Director for [place] and [facility].

## 2015-08-26 ENCOUNTER — Emergency Department: Payer: Non-veteran care

## 2015-08-26 ENCOUNTER — Emergency Department
Admission: EM | Admit: 2015-08-26 | Discharge: 2015-08-26 | Disposition: A | Discharge status: Home / Self Care | Payer: Non-veteran care | Attending: Emergency Medicine | Admitting: Emergency Medicine

## 2015-08-26 ENCOUNTER — Other Ambulatory Visit: Payer: Self-pay

## 2015-08-26 ENCOUNTER — Encounter: Payer: Self-pay | Admitting: Medical Oncology

## 2015-08-26 VITALS — BP 168/98 | HR 84 | Wt 210.0 lb

## 2015-08-26 DIAGNOSIS — Z7982 Long term (current) use of aspirin: Secondary | ICD-10-CM | POA: Diagnosis not present

## 2015-08-26 DIAGNOSIS — I10 Essential (primary) hypertension: Secondary | ICD-10-CM | POA: Diagnosis not present

## 2015-08-26 DIAGNOSIS — G478 Other sleep disorders: Secondary | ICD-10-CM | POA: Insufficient documentation

## 2015-08-26 DIAGNOSIS — Z79899 Other long term (current) drug therapy: Secondary | ICD-10-CM | POA: Diagnosis not present

## 2015-08-26 DIAGNOSIS — R21 Rash and other nonspecific skin eruption: Secondary | ICD-10-CM | POA: Insufficient documentation

## 2015-08-26 DIAGNOSIS — R0602 Shortness of breath: Secondary | ICD-10-CM | POA: Diagnosis present

## 2015-08-26 DIAGNOSIS — Z87891 Personal history of nicotine dependence: Secondary | ICD-10-CM | POA: Insufficient documentation

## 2015-08-26 DIAGNOSIS — Z951 Presence of aortocoronary bypass graft: Secondary | ICD-10-CM

## 2015-08-26 DIAGNOSIS — R079 Chest pain, unspecified: Secondary | ICD-10-CM | POA: Diagnosis not present

## 2015-08-26 DIAGNOSIS — R0600 Dyspnea, unspecified: Secondary | ICD-10-CM | POA: Insufficient documentation

## 2015-08-26 LAB — CBC
HEMATOCRIT: 46.3 % (ref 40.0–52.0)
HEMOGLOBIN: 16.3 g/dL (ref 13.0–18.0)
MCH: 30.6 pg (ref 26.0–34.0)
MCHC: 35.2 g/dL (ref 32.0–36.0)
MCV: 86.9 fL (ref 80.0–100.0)
Platelets: 188 10*3/uL (ref 150–440)
RBC: 5.33 MIL/uL (ref 4.40–5.90)
RDW: 14.3 % (ref 11.5–14.5)
WBC: 7.4 10*3/uL (ref 3.8–10.6)

## 2015-08-26 LAB — HEPATIC FUNCTION PANEL
ALBUMIN: 4.2 g/dL (ref 3.5–5.0)
ALT: 36 U/L (ref 17–63)
AST: 27 U/L (ref 15–41)
Alkaline Phosphatase: 80 U/L (ref 38–126)
BILIRUBIN DIRECT: 0.1 mg/dL (ref 0.1–0.5)
Indirect Bilirubin: 0.5 mg/dL (ref 0.3–0.9)
Total Bilirubin: 0.6 mg/dL (ref 0.3–1.2)
Total Protein: 7.7 g/dL (ref 6.5–8.1)

## 2015-08-26 LAB — BASIC METABOLIC PANEL
ANION GAP: 10 (ref 5–15)
BUN: 20 mg/dL (ref 6–20)
CALCIUM: 9.2 mg/dL (ref 8.9–10.3)
CO2: 23 mmol/L (ref 22–32)
Chloride: 104 mmol/L (ref 101–111)
Creatinine, Ser: 1.02 mg/dL (ref 0.61–1.24)
GLUCOSE: 153 mg/dL — AB (ref 65–99)
POTASSIUM: 3.7 mmol/L (ref 3.5–5.1)
Sodium: 137 mmol/L (ref 135–145)

## 2015-08-26 LAB — TROPONIN I: Troponin I: <0.03 ng/mL (ref <0.031)

## 2015-08-26 LAB — LIPASE, BLOOD: LIPASE: 35 U/L (ref 11–51)

## 2015-08-26 MED ORDER — PREDNISONE 20 MG PO TABS
20.0000 mg | ORAL_TABLET | Freq: Once | ORAL | Status: AC
  Administered 2015-08-26: 20 mg via ORAL
  Filled 2015-08-26: qty 1

## 2015-08-26 MED ORDER — IOHEXOL 350 MG/ML SOLN
75.0000 mL | Freq: Once | INTRAVENOUS | Status: AC | PRN
  Administered 2015-08-26: 75 mL via INTRAVENOUS

## 2015-08-26 MED ORDER — PREDNISONE 20 MG PO TABS: 40.0000 mg | ORAL_TABLET | Freq: Every day | ORAL | Status: DC

## 2015-08-26 MED ORDER — DIPHENHYDRAMINE HCL 50 MG/ML IJ SOLN
25.0000 mg | Freq: Once | INTRAMUSCULAR | Status: AC
  Administered 2015-08-26: 25 mg via INTRAVENOUS
  Filled 2015-08-26: qty 1

## 2015-08-26 NOTE — Progress Notes (Signed)
Cardiac Individual Treatment Plan  Patient Details  Name: Andrian Sabala MRN: [id number] Date of Birth: September 18, 1955 Referring Provider:  [facility]  Initial Encounter Date:    Visit Diagnosis: S/P CABG x 2  Patient's Home Medications on Admission:  Current outpatient prescriptions:  .  aspirin 81 MG tablet, Take 81 mg by mouth daily., Disp: , Rfl:  .  atorvastatin (LIPITOR) 80 MG tablet, Take 80 mg by mouth daily., Disp: , Rfl:  .  carvedilol (COREG) 6.25 MG tablet, Take 6.25 mg by mouth 2 (two) times daily with a meal., Disp: , Rfl:  .  cyclobenzaprine (FLEXERIL) 10 MG tablet, Take 10 mg by mouth 3 (three) times daily as needed for muscle spasms., Disp: , Rfl:  .  docusate sodium (COLACE) 100 MG capsule, Take 100 mg by mouth 2 (two) times daily., Disp: , Rfl:  .  furosemide (LASIX) 20 MG tablet, Take 20 mg by mouth 3 (three) times daily., Disp: , Rfl:  .  gabapentin (NEURONTIN) 300 MG capsule, Take 300 mg by mouth 3 (three) times daily., Disp: , Rfl:  .  HYDROcodone-acetaminophen (NORCO) 10-325 MG tablet, Take 1 tablet by mouth every 6 (six) hours as needed., Disp: , Rfl:  .  losartan (COZAAR) 50 MG tablet, Take 50 mg by mouth daily., Disp: , Rfl:  .  pantoprazole (PROTONIX) 20 MG tablet, Take 20 mg by mouth daily., Disp: , Rfl:   Past Medical History: Past Medical History  Diagnosis Date  . Hypertension   . Hypercholesteremia   . S/P coronary artery bypass graft x 2     Tobacco Use: History  Smoking status  . Former Smoker  Smokeless tobacco  . Not on file    Labs: Recent Review Flowsheet Data    There is no flowsheet data to display.       Exercise Target Goals:    Exercise Program Goal: Individual exercise prescription set with THRR, safety & activity barriers. Participant demonstrates ability to understand and report RPE using BORG scale, to self-measure pulse accurately, and to acknowledge the importance of the exercise prescription.  Exercise  Prescription Goal: Starting with aerobic activity 30 plus minutes a day, 3 days per week for initial exercise prescription. Provide home exercise prescription and guidelines that participant acknowledges understanding prior to discharge.  Activity Barriers & Risk Stratification:     Activity Barriers & Cardiac Risk Stratification - 07/31/15 1756    Activity Barriers & Cardiac Risk Stratification   Activity Barriers Arthritis;Back Problems;Joint Problems;Neck/Spine Problems  Demario has herniated discs at L3-5; s/p neck surgery for herniated disc in neck; right anterior lateral thigh numbness since CABG; Scheduled for MRI of back in next couple weeks to evaluate if right thigh pain/numbness coming from disc problems    Cardiac Risk Stratification High      6 Minute Walk:     6 Minute Walk      07/31/15 1440       6 Minute Walk   Phase Initial     Distance 1650 feet     Walk Time 6 minutes     # of Rest Breaks 0     MPH 3     RPE 12     Symptoms No     Resting HR 84 bpm     Resting BP 136/86 mmHg     Max Ex. HR 120 bpm     Max Ex. BP 164/90 mmHg        Initial  Exercise Prescription:     Initial Exercise Prescription - 07/31/15 1500    Date of Initial Exercise Prescription   Date 07/31/15   Treadmill   MPH 2.8   Grade 0   Minutes 10   Bike   Level 0.4   Watts 15   Minutes 15   Recumbant Bike   Level 4   RPM 40   Watts 35   Minutes 15   NuStep   Level 3   Watts 40   Minutes 15   Arm Ergometer   Level 1   Watts 8   Minutes 10   Arm/Foot Ergometer   Level 4   Watts 12   Minutes 10   Cybex   Level 3   RPM 50   Minutes 15   Recumbant Elliptical   Level 2   RPM 40   Watts 15   Minutes 15   Elliptical   Level 1   Speed 3   Minutes 1   REL-XR   Level 3   Watts 45   Minutes 15   T5 Nustep   Level 2   Watts 20   Minutes 15   Biostep-RELP   Level 3   Watts 20   Minutes 15   Prescription Details   Frequency (times per week) 3   Duration  Progress to 30 minutes of continuous aerobic without signs/symptoms of physical distress   Intensity   THRR REST +  30   Ratings of Perceived Exertion 11-15   Progression   Progression Continue progressive overload as per policy without signs/symptoms or physical distress.   Resistance Training   Training Prescription Yes   Weight 3   Reps 10-15      Exercise Prescription Changes:     Exercise Prescription Changes      08/05/15 1700 08/14/15 1600         Response to Exercise   Comments Patient attended first day of cardiac rehabilitation. Following exercise on treadmill, patient stated felt dizzy.  BP taken in sitting with reading of 128/78.  Given water with dizziness abating.  Resumed exercise on another piece of equipment.  No other adverse effects noted.       Resistance Training   Training Prescription  Yes      Weight  5      Reps  10-15      Treadmill   MPH  2.8      Grade  0      Minutes  15      REL-XR   Level  5      Watts  45      Minutes  20         Discharge Exercise Prescription (Final Exercise Prescription Changes):     Exercise Prescription Changes - 08/14/15 1600    Resistance Training   Training Prescription Yes   Weight 5   Reps 10-15   Treadmill   MPH 2.8   Grade 0   Minutes 15   REL-XR   Level 5   Watts 45   Minutes 20      Nutrition:  Target Goals: Understanding of nutrition guidelines, daily intake of sodium <1585m, cholesterol <2047m calories 30% from fat and 7% or less from saturated fats, daily to have 5 or more servings of fruits and vegetables.  Biometrics:     Pre Biometrics - 07/31/15 1439    Pre Biometrics   Height '5\' 7"'  (1.702 m)  Weight 205 lb (92.987 kg)   Waist Circumference 40.5 inches   Hip Circumference 40.5 inches   Waist to Hip Ratio 1 %   BMI (Calculated) 32.2       Nutrition Therapy Plan and Nutrition Goals:     Nutrition Therapy & Goals - 08/14/15 1611    Nutrition Therapy   Diet 1700kcal  DASH diet    Drug/Food Interactions Statins/Certain Fruits   Protein (specify units) 7-8oz   Fiber 30 grams   Whole Grain Foods 3 servings   Saturated Fats 13 max. grams   Fruits and Vegetables 5 servings/day   Sodium 1500 grams   Personal Nutrition Goals   Personal Goal #1 Drink more water each day -- at least 64 oz fluids daily   Personal Goal #2 Work to include fruits and vegetables on a more consistent basis   Personal Goal #3 Choose lean meats -- [place] versions of sausage, bacon, etc.   Comments Patient states he eats foods according to his taste any any particular time; for example, he might eat bananas 2-3 at a time for several days or weeks and then not eat any for 1-2 months.    Intervention Plan   Intervention Nutrition handout(s) given to patient.;Prescribe, educate and counsel regarding individualized specific dietary modifications aiming towards targeted core components such as weight, hypertension, lipid management, diabetes, heart failure and other comorbidities.   Expected Outcomes Short Term Goal: A plan has been developed with personal nutrition goals set during dietitian appointment.      Nutrition Discharge: Rate Your Plate Scores:     Nutrition Assessments - 08/08/15 0849    Rate Your Plate Scores   Pre Score 58   Pre Score % 64 %      Nutrition Goals Re-Evaluation:   Psychosocial: Target Goals: Acknowledge presence or absence of depression, maximize coping skills, provide positive support system. Participant is able to verbalize types and ability to use techniques and skills needed for reducing stress and depression.  Initial Review & Psychosocial Screening:     Initial Psych Review & Screening - 07/31/15 1817    Initial Review   Current issues with Current Depression;Current Sleep Concerns;Current Stress Concerns   Comments Shanon Brow informed nurse that he has been having anxiety issues (had bypass surgery in November 2016). Jaydan described a couple of  episodes where he became really anxious and on edge and he needed to be alone during those times; Stella's mother passed away 3 weeks ago;    Family Dynamics   Good Support System? No   Comments reports having a close friend.  Yarden's mother passed away 3 weeks ago; has 2 grown children and 8 grandkids and will soon have a great grandchild.  Donny states he has never been on any medication for anxiety or depression.  In addition, prior to his heart bypass, Jerrian was exercising 3 days a week for 2 hours prior to having heart  bypass.  Buel states he has gained weight since his bypass surgery.     Barriers   Psychosocial barriers to participate in program The patient should benefit from training in stress management and relaxation.   Screening Interventions   Interventions Program counselor consult;Encouraged to exercise      Quality of Life Scores:     Quality of Life - 07/31/15 1826    Quality of Life Scores   Health/Function Pre 21.57 %   Socioeconomic Pre 18.57 %   Psych/Spiritual Pre 23.36 %   Family  Pre 19.2 %   GLOBAL Pre 20.97 %      PHQ-9:     Recent Review Flowsheet Data    Depression screen [facility] 2/9 07/31/2015   Decreased Interest 2   Down, Depressed, Hopeless 2   PHQ - 2 Score 4   Altered sleeping 2   Tired, decreased energy 1   Change in appetite 0   Feeling bad or failure about yourself  1   Trouble concentrating 0   Moving slowly or fidgety/restless 0   Suicidal thoughts 0   PHQ-9 Score 8   Difficult doing work/chores Not difficult at all      Psychosocial Evaluation and Intervention:     Psychosocial Evaluation - 08/05/15 1613    Psychosocial Evaluation & Interventions   Interventions Encouraged to exercise with the program and follow exercise prescription;Stress management education;Relaxation education   Comments Counselor met with Mr. Diltz today for initial psychological evaluation.  He is a 60 year old who has a family history of heart disease  and had open heart surgery on 04/18/15. 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  Mr. Shrout has sleep apnea as well as cardiac issues and reports he has intermittent sleep often - although he uses his CPAP nightly.  He denies a history of depression, but has some current symptoms.  Counselor assessed for depression, with Mr. Kissner reporting his mother just passed away 3 weeks ago and accompanied by his current health and job stress, it is impacting him at this time.   He reported his current mood as an "8" on a scale of 1-10 with "10" being the best mood at this time.   Mr. Mcclaine has goals to lose weight and get back in shape so he can return to the gym where he was working out typically 3x/week last summer and early fall.  He is also encouraged to attend the stress management and depression psychoeducational components of this program and meet with the dietician to address his weight loss goals.     Continued Psychosocial Services Needed Yes  Mr. Ashby will benefit from the psychoeducational components of this program.  He also needs to meet with the dietician to address his weight loss goals.       Psychosocial Re-Evaluation:     Psychosocial Re-Evaluation      08/26/15 1700           Psychosocial Re-Evaluation   Comments Dennie taken to [place] for c/o shortness of breath since last night and couldn't lie flat. Pulse ox room air in Cardiac 97% Bl 168/98. No chest pain.           Vocational Rehabilitation: Provide vocational rehab assistance to qualifying candidates.   Vocational Rehab Evaluation & Intervention:     Vocational Rehab - 07/31/15 1803    Initial Vocational Rehab Evaluation & Intervention   Assessment shows need for Vocational Rehabilitation No      Education: Education Goals: Education classes will be provided on a weekly basis, covering required topics.  Participant will state understanding/return demonstration of topics presented.  Learning Barriers/Preferences:     Learning Barriers/Preferences - 07/31/15 1800    Learning Barriers/Preferences   Learning Barriers None   Learning Preferences Written Material;Video;Group Instruction      Education Topics: General Nutrition Guidelines/Fats and Fiber: -Group instruction provided by verbal, written material, models and posters to present  the general guidelines for heart healthy nutrition. Gives an explanation and review of dietary fats and fiber.   Controlling Sodium/Reading Food Labels: -Group verbal and written material supporting the discussion of sodium use in heart healthy nutrition. Review and explanation with models, verbal and written materials for utilization of the food label.   Exercise Physiology & Risk Factors: - Group verbal and written instruction with models to review the exercise physiology of the cardiovascular system and associated critical values. Details cardiovascular disease risk factors and the goals associated with each risk factor.          Cardiac Rehab from 08/21/2015 in [facility] Cardiac and Pulmonary Rehab   Date  08/12/15   Educator  sw   Instruction Review Code  2- meets goals/outcomes      Aerobic Exercise & Resistance Training: - Gives group verbal and written discussion on the health impact of inactivity. On the components of aerobic and resistive training programs and the benefits of this training and how to safely progress through these programs.      Cardiac Rehab from 08/21/2015 in [facility] Cardiac and Pulmonary Rehab   Date  08/14/15   Educator  B. Sickles   Instruction Review Code  2- meets goals/outcomes      Flexibility, Balance, General Exercise Guidelines: - Provides group verbal and written instruction on the benefits of flexibility and balance training programs. Provides general exercise guidelines with specific guidelines to those with heart or  lung disease. Demonstration and skill practice provided.   Stress Management: - Provides group verbal and written instruction about the health risks of elevated stress, cause of high stress, and healthy ways to reduce stress.      Cardiac Rehab from 08/21/2015 in [facility] Cardiac and Pulmonary Rehab   Date  08/21/15   Educator  Kathreen Cornfield, [facility]   Instruction Review Code  2- meets goals/outcomes      Depression: - Provides group verbal and written instruction on the correlation between heart/lung disease and depressed mood, treatment options, and the stigmas associated with seeking treatment.      Cardiac Rehab from 08/21/2015 in [facility] Cardiac and Pulmonary Rehab   Date  08/07/15   Educator  Kathreen Cornfield, [facility]   Instruction Review Code  2- meets goals/outcomes      Anatomy & Physiology of the Heart: - Group verbal and written instruction and models provide basic cardiac anatomy and physiology, with the coronary electrical and arterial systems. Review of: AMI, Angina, Valve disease, Heart Failure, Cardiac Arrhythmia, Pacemakers, and the ICD.   Cardiac Procedures: - Group verbal and written instruction and models to describe the testing methods done to diagnose heart disease. Reviews the outcomes of the test results. Describes the treatment choices: Medical Management, Angioplasty, or Coronary Bypass Surgery.      Cardiac Rehab from 08/21/2015 in [facility] Cardiac and Pulmonary Rehab   Date  08/05/15   Educator  SB   Instruction Review Code  2- meets goals/outcomes      Cardiac Medications: - Group verbal and written instruction to review commonly prescribed medications for heart disease. Reviews the medication, class of the drug, and side effects. Includes the steps to properly store meds and maintain the prescription regimen.   Go Sex-Intimacy & Heart Disease, Get SMART - Goal Setting: - Group verbal and written instruction through game format to discuss heart disease and the return to sexual  intimacy. Provides group verbal and written material to discuss and apply goal setting through the application  of the S.M.A.R.T. Method.      Cardiac Rehab from 08/21/2015 in [facility] Cardiac and Pulmonary Rehab   Date  08/05/15   Educator  SB   Instruction Review Code  2- meets goals/outcomes      Other Matters of the Heart: - Provides group verbal, written materials and models to describe Heart Failure, Angina, Valve Disease, and Diabetes in the realm of heart disease. Includes description of the disease process and treatment options available to the cardiac patient.   Exercise & Equipment Safety: - Individual verbal instruction and demonstration of equipment use and safety with use of the equipment.      Cardiac Rehab from 08/21/2015 in [facility] Cardiac and Pulmonary Rehab   Date  07/31/15   Educator  D. Joya Gaskins, RN   Instruction Review Code  1- partially meets, needs review/practice      Infection Prevention: - Provides verbal and written material to individual with discussion of infection control including proper hand washing and proper equipment cleaning during exercise session.      Cardiac Rehab from 08/21/2015 in [facility] Cardiac and Pulmonary Rehab   Date  07/31/15   Educator  D. Joya Gaskins, RN   Instruction Review Code  2- meets goals/outcomes      Falls Prevention: - Provides verbal and written material to individual with discussion of falls prevention and safety.      Cardiac Rehab from 08/21/2015 in [facility] Cardiac and Pulmonary Rehab   Date  07/31/15   Educator  D. Joya Gaskins, RN   Instruction Review Code  2- meets goals/outcomes      Diabetes: - Individual verbal and written instruction to review signs/symptoms of diabetes, desired ranges of glucose level fasting, after meals and with exercise. Advice that pre and post exercise glucose checks will be done for 3 sessions at entry of program.    Knowledge Questionnaire Score:     Knowledge Questionnaire Score - 07/31/15 1800     Knowledge Questionnaire Score   Pre Score 24/28      Personal Goals and Risk Factors at Admission:     Personal Goals and Risk Factors at Admission - 07/31/15 1815    Core Components/Risk Factors/Patient Goals on Admission    Weight Management Yes   Intervention (read-only) Learn and follow the exercise and diet guidelines while in the program. Utilize the nutrition and education classes to help gain knowledge of the diet and exercise expectations in the program   Admit Weight 205 lb (92.987 kg)   Goal Weight: Short Term 165 lb (74.844 kg)   Sedentary Yes   Intervention (read-only) While in program, learn and follow the exercise prescription taught. Start at a low level workload and increase workload after able to maintain previous level for 30 minutes. Increase time before increasing intensity.   Diabetes No   Hypertension Yes   Goal Participant will see blood pressure controlled within the values of 140/64m/Hg or within value directed by their physician.   Intervention (read-only) Provide nutrition & aerobic exercise along with prescribed medications to achieve BP 140/90 or less.   Lipids Yes   Goal Cholesterol controlled with medications as prescribed, with individualized exercise RX and with personalized nutrition plan. Value goals: LDL < 779m HDL > 4065mParticipant states understanding of desired cholesterol values and following prescriptions.   Intervention (read-only) Provide nutrition & aerobic exercise along with prescribed medications to achieve LDL <49m71mDL >40mg1mStress Yes   Goal To meet with psychosocial counselor for stress  and relaxation information and guidance. To state understanding of performing relaxation techniques and or identifying personal stressors.   Intervention (read-only) Provide education on types of stress, identifiying stressors, and ways to cope with stress. Provide demonstration and active practice of relaxation techniques.   Take Less Medication  Yes   Intervention Learn your risk factors and begin the lifestyle modifications for risk factor control during your time in the program.      Personal Goals and Risk Factors Review:      Goals and Risk Factor Review      08/26/15 1659           Core Components/Risk Factors/Patient Goals Review   Personal Goals Review Sedentary;Hypertension       Review Laird taken to [place] for c/o shortness of breath since last night and couldn't lie flat. Pulse ox room air in Cardiac 97% Bl 168/98. No chest pain.        Expected Outcomes Controlled blood pressure and no SOB          Personal Goals Discharge (Final Personal Goals and Risk Factors Review):      Goals and Risk Factor Review - 08/26/15 1659    Core Components/Risk Factors/Patient Goals Review   Personal Goals Review Sedentary;Hypertension   Review Imraan taken to [place] for c/o shortness of breath since last night and couldn't lie flat. Pulse ox room air in Cardiac 97% Bl 168/98. No chest pain.    Expected Outcomes Controlled blood pressure and no SOB      ITP Comments:     ITP Comments      08/06/15 0731 08/19/15 1356 08/26/15 1658       ITP Comments 30 day review.  Continue with ITP  new to program, to this date he has completed orientation and attended one session.  Caleel is experiencing mild to severe pain related to the surgery. He will approach the exercises with caution and will not use weignts until he sees some relief with the muscle and chest wall pain.  Elver left a vm today that he will not be able to attend Cardiac Rehab this evening but hopes to return on Wednesday.  Lucian taken to [place] for c/o shortness of breath since last night and couldn't lie flat. Pulse ox room air in Cardiac 97% Bl 168/98. No chest pain.         Comments: Isaia taken to [place] for c/o shortness of breath since last night and couldn't lie flat. Pulse ox room air in Cardiac 97% Bl 168/98. No chest pain.

## 2015-08-26 NOTE — ED Notes (Signed)

## 2015-08-26 NOTE — ED Provider Notes (Signed)
[facility] Emergency Department Provider Note  ____________________________________________  Time seen: 4:50 PM  I have reviewed the triage vital signs and the nursing notes.   HISTORY  Chief Complaint Shortness of Breath    HPI Jonathon Newman is a 60 y.o. male who is status post CABG 4 months ago. He complains of stabbing pain in the center of his chest and shortness of breath that is been somewhat chronic in nature ever since his surgery and is been evaluated multiple times by his cardiology team without any identifiable pathology or complications. More recently over the last few days he feels like his shortness of breath is worse and that he has discomfort when lying down flat. No exertional chest pain. He does have some decreased exercise tolerance. He's been compliant with his medications. He recently tried to restart Flexeril and gabapentin due to his symptoms. He also takes Norco every now and then for pain but hasn't taken in 4 days.  The pain is sharp stabbing, intermittent lasting few seconds at a time, nonradiating. Specifically associated with shortness of breath, no vomiting diaphoresis. The chest pain overall is unchanged from the last few months according to the patient. Moderate intensity when present, worse with movement. No alleviating factors.     Past Medical History  Diagnosis Date  . Hypertension   . Hypercholesteremia   . S/P coronary artery bypass graft x 2      Patient Active Problem List   Diagnosis Date Noted  . CAD (coronary artery disease) 07/31/2015  . Benign essential HTN 07/31/2015  . Hyperlipidemia 07/31/2015  . OSA (obstructive sleep apnea) 07/31/2015     Past Surgical History  Procedure Laterality Date  . Cardiac catheterization    . Appendectomy    . Cholecystectomy    . Coronary stent placement    . Knee surgery       Current Outpatient Rx  Name  Route  Sig  Dispense  Refill  . aspirin 81 MG tablet  Oral   Take 81 mg by mouth daily.         Marland Kitchen atorvastatin (LIPITOR) 80 MG tablet   Oral   Take 80 mg by mouth daily.         . carvedilol (COREG) 6.25 MG tablet   Oral   Take 6.25 mg by mouth 2 (two) times daily with a meal.         . cyclobenzaprine (FLEXERIL) 10 MG tablet   Oral   Take 10 mg by mouth 3 (three) times daily as needed for muscle spasms.         Marland Kitchen docusate sodium (COLACE) 100 MG capsule   Oral   Take 100 mg by mouth 2 (two) times daily.         . furosemide (LASIX) 20 MG tablet   Oral   Take 20 mg by mouth 3 (three) times daily.         Marland Kitchen gabapentin (NEURONTIN) 300 MG capsule   Oral   Take 300 mg by mouth 3 (three) times daily.         Marland Kitchen HYDROcodone-acetaminophen (NORCO) 10-325 MG tablet   Oral   Take 1 tablet by mouth every 6 (six) hours as needed.         Marland Kitchen losartan (COZAAR) 50 MG tablet   Oral   Take 50 mg by mouth daily.         . pantoprazole (PROTONIX) 20 MG tablet   Oral  Take 20 mg by mouth daily.         . predniSONE (DELTASONE) 20 MG tablet   Oral   Take 2 tablets (40 mg total) by mouth daily.   8 tablet   0      Allergies Review of patient's allergies indicates no known allergies.   No family history on file.  Social History Social History  Substance Use Topics  . Smoking status: Former Games developer  . Smokeless tobacco: None  . Alcohol Use: No    Review of Systems  Constitutional:   No fever or chills. No weight changes Eyes:   No blurry vision or double vision.  ENT:   No sore throat.  Cardiovascular:   AS above chest pain. Respiratory:   Worsening dyspnea, no cough. Gastrointestinal:   Negative for abdominal pain, vomiting and diarrhea.  No BRBPR or melena. Genitourinary:   Negative for dysuria or difficulty urinating. Musculoskeletal:   Negative for back pain. No joint swelling or pain. Skin:   Negative for rash. Neurological:   Negative for headaches, focal weakness or numbness. Psychiatric:  No  anxiety or depression.   Endocrine:  Difficulty sleeping over the last few weeks. No energy changes  10-point ROS otherwise negative.  ____________________________________________   PHYSICAL EXAM:  VITAL SIGNS: ED Triage Vitals  Enc Vitals Group     BP 08/26/15 1618 174/100 mmHg     Pulse Rate 08/26/15 1618 74     Resp 08/26/15 1618 16     Temp 08/26/15 1618 98.3 F (36.8 C)     Temp Source 08/26/15 1618 Oral     SpO2 08/26/15 1618 94 %     Weight 08/26/15 1618 211 lb (95.709 kg)     Height 08/26/15 1618  (1.702 m)     Head Cir --      Peak Flow --      Pain Score 08/26/15 1626 7     Pain Loc --      Pain Edu? --      Excl. in GC? --     Vital signs reviewed, nursing assessments reviewed.   Constitutional:   Alert and oriented. Well appearing and in no distress. Eyes:   No scleral icterus. No conjunctival pallor. PERRL. EOMI ENT   Head:   Normocephalic and atraumatic.   Nose:   No congestion/rhinnorhea. No septal hematoma   Mouth/Throat:   MMM, no pharyngeal erythema. No peritonsillar mass.    Neck:   No stridor. No SubQ emphysema. No meningismus.No JVD Hematological/Lymphatic/Immunilogical:   No cervical lymphadenopathy. Cardiovascular:   RRR. Symmetric bilateral radial and DP pulses.  No murmurs.  Respiratory:   Normal respiratory effort without tachypnea nor retractions. Breath sounds are clear and equal bilaterally. No wheezes/rales/rhonchi. Gastrointestinal:   Soft with epigastric tenderness. Non distended. There is no CVA tenderness.  No rebound, rigidity, or guarding. Genitourinary:   deferred Musculoskeletal:   Nontender with normal range of motion in all extremities. No joint effusions.  No lower extremity tenderness.  No edema. Chest wall overall nontender. Sternotomy appears to be healing well. No focal bony tenderness, no instability. Neurologic:   Normal speech and language.  CN 2-10 normal. Motor grossly intact. No gross focal  neurologic deficits are appreciated.  Skin:    There is a diffuse erythematous rash over the head and neck. Patient thinks this is a reaction to gabapentin. There is a well-healed sternotomy scar anteriorly, nontender. No drainage or inflammatory changes Psychiatric:   Mood  and affect are normal. ____________________________________________    LABS (pertinent positives/negatives) (all labs ordered are listed, but only abnormal results are displayed) Labs Reviewed  BASIC METABOLIC PANEL - Abnormal; Notable for the following:    Glucose, Bld 153 (*)    All other components within normal limits  CBC  TROPONIN I  LIPASE, BLOOD  HEPATIC FUNCTION PANEL   ____________________________________________   EKG  Interpreted by me Normal sinus rhythm rate of 74, normal axis and intervals. Normal QRS ST segments and T waves  ____________________________________________    RADIOLOGY  Chest x-ray unremarkable, sternotomy wires in place. CT angiogram negative. No pericardial effusion. No pneumothorax or edema.  ____________________________________________   PROCEDURES   ____________________________________________   INITIAL IMPRESSION / ASSESSMENT AND PLAN / ED COURSE  Pertinent labs & imaging results that were available during my care of the patient were reviewed by me and considered in my medical decision making (see chart for details).  Patient presents with worsening shortness of breath and decreased exercise tolerance and chronic chest pain that is likely related to his postsurgical changes. Extensive workup today including troponin and imaging and labs all unremarkable. Vital signs are unremarkable except for some elevated blood pressure. Patient counseled extensively on following up with his cardiology team. Suspect that the pain is related to some scar tissue formation or other healing process with his sternotomy but this is nonspecific and I suggested the patient follow up  closely with his cardiology team for consideration of a repeat echocardiogram. Does not appear to be in overt heart failure at this time, no evidence of pulmonary edema or respiratory distress. Stable for discharge home.     ____________________________________________   FINAL CLINICAL IMPRESSION(S) / ED DIAGNOSES  Final diagnoses:  Nonspecific chest pain      Sharman CheekPhillip Missie Gehrig, MD 08/26/15 1944

## 2015-08-26 NOTE — ED Notes (Signed)
Patient transported to CT via stretcher.

## 2015-08-26 NOTE — Discharge Instructions (Signed)

## 2015-08-26 NOTE — ED Notes (Signed)
Pt reports he suddenly last night had a episode of pain to the center of his chest that was a stabbing pain then suddenly began to feel sob. Pt reports he had a hard time last night laying flat d/t sob and also reports sob with any exertion. Pt reports presently he feels like his chest is tight and he cant get "enough air".

## 2015-08-28 ENCOUNTER — Encounter: Payer: Self-pay | Admitting: *Deleted

## 2015-08-28 DIAGNOSIS — Z951 Presence of aortocoronary bypass graft: Secondary | ICD-10-CM

## 2015-08-28 NOTE — Progress Notes (Signed)
Cardiac Individual Treatment Plan  Patient Details  Name: Jonathon Newman MRN: [id number] Date of Birth: Jun 30, 1955 Referring Provider:  No ref. provider found  Initial Encounter Date:    Visit Diagnosis: S/P CABG x 2  Patient's Home Medications on Admission:  Current outpatient prescriptions:  .  aspirin 81 MG tablet, Take 81 mg by mouth daily., Disp: , Rfl:  .  atorvastatin (LIPITOR) 80 MG tablet, Take 80 mg by mouth daily., Disp: , Rfl:  .  carvedilol (COREG) 6.25 MG tablet, Take 6.25 mg by mouth 2 (two) times daily with a meal., Disp: , Rfl:  .  cyclobenzaprine (FLEXERIL) 10 MG tablet, Take 10 mg by mouth 3 (three) times daily as needed for muscle spasms., Disp: , Rfl:  .  docusate sodium (COLACE) 100 MG capsule, Take 100 mg by mouth 2 (two) times daily., Disp: , Rfl:  .  furosemide (LASIX) 20 MG tablet, Take 20 mg by mouth 3 (three) times daily., Disp: , Rfl:  .  gabapentin (NEURONTIN) 300 MG capsule, Take 300 mg by mouth 3 (three) times daily., Disp: , Rfl:  .  HYDROcodone-acetaminophen (NORCO) 10-325 MG tablet, Take 1 tablet by mouth every 6 (six) hours as needed., Disp: , Rfl:  .  losartan (COZAAR) 50 MG tablet, Take 50 mg by mouth daily., Disp: , Rfl:  .  pantoprazole (PROTONIX) 20 MG tablet, Take 20 mg by mouth daily., Disp: , Rfl:  .  predniSONE (DELTASONE) 20 MG tablet, Take 2 tablets (40 mg total) by mouth daily., Disp: 8 tablet, Rfl: 0  Past Medical History: Past Medical History  Diagnosis Date  . Hypertension   . Hypercholesteremia   . S/P coronary artery bypass graft x 2     Tobacco Use: History  Smoking status  . Former Smoker  Smokeless tobacco  . Not on file    Labs: Recent Review Flowsheet Data    There is no flowsheet data to display.       Exercise Target Goals:    Exercise Program Goal: Individual exercise prescription set with THRR, safety & activity barriers. Participant demonstrates ability to understand and report RPE using BORG scale, to  self-measure pulse accurately, and to acknowledge the importance of the exercise prescription.  Exercise Prescription Goal: Starting with aerobic activity 30 plus minutes a day, 3 days per week for initial exercise prescription. Provide home exercise prescription and guidelines that participant acknowledges understanding prior to discharge.  Activity Barriers & Risk Stratification:     Activity Barriers & Cardiac Risk Stratification - 07/31/15 1756    Activity Barriers & Cardiac Risk Stratification   Activity Barriers Arthritis;Back Problems;Joint Problems;Neck/Spine Problems  Elba has herniated discs at L3-5; s/p neck surgery for herniated disc in neck; right anterior lateral thigh numbness since CABG; Scheduled for MRI of back in next couple weeks to evaluate if right thigh pain/numbness coming from disc problems    Cardiac Risk Stratification High      6 Minute Walk:     6 Minute Walk      07/31/15 1440       6 Minute Walk   Phase Initial     Distance 1650 feet     Walk Time 6 minutes     # of Rest Breaks 0     MPH 3     RPE 12     Symptoms No     Resting HR 84 bpm     Resting BP 136/86 mmHg     Max  Ex. HR 120 bpm     Max Ex. BP 164/90 mmHg        Initial Exercise Prescription:     Initial Exercise Prescription - 07/31/15 1500    Date of Initial Exercise Prescription   Date 07/31/15   Treadmill   MPH 2.8   Grade 0   Minutes 10   Bike   Level 0.4   Watts 15   Minutes 15   Recumbant Bike   Level 4   RPM 40   Watts 35   Minutes 15   NuStep   Level 3   Watts 40   Minutes 15   Arm Ergometer   Level 1   Watts 8   Minutes 10   Arm/Foot Ergometer   Level 4   Watts 12   Minutes 10   Cybex   Level 3   RPM 50   Minutes 15   Recumbant Elliptical   Level 2   RPM 40   Watts 15   Minutes 15   Elliptical   Level 1   Speed 3   Minutes 1   REL-XR   Level 3   Watts 45   Minutes 15   T5 Nustep   Level 2   Watts 20   Minutes 15   Biostep-RELP    Level 3   Watts 20   Minutes 15   Prescription Details   Frequency (times per week) 3   Duration Progress to 30 minutes of continuous aerobic without signs/symptoms of physical distress   Intensity   THRR REST +  30   Ratings of Perceived Exertion 11-15   Progression   Progression Continue progressive overload as per policy without signs/symptoms or physical distress.   Resistance Training   Training Prescription Yes   Weight 3   Reps 10-15      Exercise Prescription Changes:     Exercise Prescription Changes      08/05/15 1700 08/14/15 1600 08/21/15 1108       Exercise Review   Progression   Yes     Response to Exercise   Blood Pressure (Admit)   138/82 mmHg     Blood Pressure (Exercise)   168/80 mmHg     Blood Pressure (Exit)   150/88 mmHg     Heart Rate (Admit)   77 bpm     Heart Rate (Exercise)   113 bpm     Heart Rate (Exit)   84 bpm     Rating of Perceived Exertion (Exercise)   12     Symptoms   None     Comments Patient attended first day of cardiac rehabilitation. Following exercise on treadmill, patient stated felt dizzy.  BP taken in sitting with reading of 128/78.  Given water with dizziness abating.  Resumed exercise on another piece of equipment.  No other adverse effects noted.  Reviewed individualized exercise prescription and made increases per departmental policy. Exercise increases were discussed with the patient and they were able to perform the new work loads without issue (no signs or symptoms).      Duration   Progress to 45 minutes of aerobic exercise without signs/symptoms of physical distress     Intensity   Rest + 30     Progression   Progression   Continue progressive overload as per policy without signs/symptoms or physical distress.     Resistance Training   Training Prescription  Yes Yes     Weight  5 5     Reps  10-15 10-15     Treadmill   MPH  2.8 2.8     Grade  0 0     Minutes  15 15     REL-XR   Level  5 6     Watts  45 90      Minutes  20 20        Discharge Exercise Prescription (Final Exercise Prescription Changes):     Exercise Prescription Changes - 08/21/15 1108    Exercise Review   Progression Yes   Response to Exercise   Blood Pressure (Admit) 138/82 mmHg   Blood Pressure (Exercise) 168/80 mmHg   Blood Pressure (Exit) 150/88 mmHg   Heart Rate (Admit) 77 bpm   Heart Rate (Exercise) 113 bpm   Heart Rate (Exit) 84 bpm   Rating of Perceived Exertion (Exercise) 12   Symptoms None   Comments Reviewed individualized exercise prescription and made increases per departmental policy. Exercise increases were discussed with the patient and they were able to perform the new work loads without issue (no signs or symptoms).    Duration Progress to 45 minutes of aerobic exercise without signs/symptoms of physical distress   Intensity Rest + 30   Progression   Progression Continue progressive overload as per policy without signs/symptoms or physical distress.   Resistance Training   Training Prescription Yes   Weight 5   Reps 10-15   Treadmill   MPH 2.8   Grade 0   Minutes 15   REL-XR   Level 6   Watts 90   Minutes 20      Nutrition:  Target Goals: Understanding of nutrition guidelines, daily intake of sodium <1522m, cholesterol <2016m calories 30% from fat and 7% or less from saturated fats, daily to have 5 or more servings of fruits and vegetables.  Biometrics:     Pre Biometrics - 07/31/15 1439    Pre Biometrics   Height _0  (1.702 m)   Weight 205 lb (92.987 kg)   Waist Circumference 40.5 inches   Hip Circumference 40.5 inches   Waist to Hip Ratio 1 %   BMI (Calculated) 32.2       Nutrition Therapy Plan and Nutrition Goals:     Nutrition Therapy & Goals - 08/14/15 1611    Nutrition Therapy   Diet 1700kcal DASH diet    Drug/Food Interactions Statins/Certain Fruits   Protein (specify units) 7-8oz   Fiber 30 grams   Whole Grain Foods 3 servings   Saturated Fats 13 max. grams    Fruits and Vegetables 5 servings/day   Sodium 1500 grams   Personal Nutrition Goals   Personal Goal #1 Drink more water each day -- at least 64 oz fluids daily   Personal Goal #2 Work to include fruits and vegetables on a more consistent basis   Personal Goal #3 Choose lean meats -- tuKuwaitersions of sausage, bacon, etc.   Comments Patient states he eats foods according to his taste any any particular time; for example, he might eat bananas 2-3 at a time for several days or weeks and then not eat any for 1-2 months.    Intervention Plan   Intervention Nutrition handout(s) given to patient.;Prescribe, educate and counsel regarding individualized specific dietary modifications aiming towards targeted core components such as weight, hypertension, lipid management, diabetes, heart failure and other comorbidities.   Expected Outcomes Short Term Goal: A plan has been developed with personal nutrition  goals set during dietitian appointment.      Nutrition Discharge: Rate Your Plate Scores:     Nutrition Assessments - 08/08/15 0849    Rate Your Plate Scores   Pre Score 58   Pre Score % 64 %      Nutrition Goals Re-Evaluation:   Psychosocial: Target Goals: Acknowledge presence or absence of depression, maximize coping skills, provide positive support system. Participant is able to verbalize types and ability to use techniques and skills needed for reducing stress and depression.  Initial Review & Psychosocial Screening:     Initial Psych Review & Screening - 07/31/15 1817    Initial Review   Current issues with Current Depression;Current Sleep Concerns;Current Stress Concerns   Comments Shanon Brow informed nurse that he has been having anxiety issues (had bypass surgery in November 2016). Nakota described a couple of episodes where he became really anxious and on edge and he needed to be alone during those times; Federick's mother passed away 3 weeks ago;    Family Dynamics   Good Support System?  No   Comments reports having a close friend.  Connor's mother passed away 3 weeks ago; has 2 grown children and 8 grandkids and will soon have a great grandchild.  Jobe states he has never been on any medication for anxiety or depression.  In addition, prior to his heart bypass, Baine was exercising 3 days a week for 2 hours prior to having heart  bypass.  Derryl states he has gained weight since his bypass surgery.     Barriers   Psychosocial barriers to participate in program The patient should benefit from training in stress management and relaxation.   Screening Interventions   Interventions Program counselor consult;Encouraged to exercise      Quality of Life Scores:     Quality of Life - 07/31/15 1826    Quality of Life Scores   Health/Function Pre 21.57 %   Socioeconomic Pre 18.57 %   Psych/Spiritual Pre 23.36 %   Family Pre 19.2 %   GLOBAL Pre 20.97 %      PHQ-9:     Recent Review Flowsheet Data    Depression screen [facility] 2/9 07/31/2015   Decreased Interest 2   Down, Depressed, Hopeless 2   PHQ - 2 Score 4   Altered sleeping 2   Tired, decreased energy 1   Change in appetite 0   Feeling bad or failure about yourself  1   Trouble concentrating 0   Moving slowly or fidgety/restless 0   Suicidal thoughts 0   PHQ-9 Score 8   Difficult doing work/chores Not difficult at all      Psychosocial Evaluation and Intervention:     Psychosocial Evaluation - 08/05/15 1613    Psychosocial Evaluation & Interventions   Interventions Encouraged to exercise with the program and follow exercise prescription;Stress management education;Relaxation education   Comments Counselor met with Mr. Duman today for initial psychological evaluation.  He is a 60 year old who has a family history of heart disease and had open heart surgery on 04/18/15. 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  Mr. Enslin  has sleep apnea as well as cardiac issues and reports he has intermittent sleep often - although he uses his CPAP nightly.  He denies a history of depression, but has some current symptoms.  Counselor assessed for depression, with Mr. Rickel reporting  his mother just passed away 3 weeks ago and accompanied by his current health and job stress, it is impacting him at this time.   He reported his current mood as an "8" on a scale of 1-10 with "10" being the best mood at this time.   Mr. Hinderman has goals to lose weight and get back in shape so he can return to the gym where he was working out typically 3x/week last summer and early fall.  He is also encouraged to attend the stress management and depression psychoeducational components of this program and meet with the dietician to address his weight loss goals.     Continued Psychosocial Services Needed Yes  Mr. Rost will benefit from the psychoeducational components of this program.  He also needs to meet with the dietician to address his weight loss goals.       Psychosocial Re-Evaluation:     Psychosocial Re-Evaluation      08/26/15 1700 08/28/15 1738         Psychosocial Re-Evaluation   Comments Cosme taken to [facility] for c/o shortness of breath since last night and couldn't lie flat. Pulse ox room air in Cardiac 97% Bl 168/98. No chest pain.  I left vm to follow up with patient today. Was discharged to home from [facility] yesterday.          Vocational Rehabilitation: Provide vocational rehab assistance to qualifying candidates.   Vocational Rehab Evaluation & Intervention:     Vocational Rehab - 07/31/15 1803    Initial Vocational Rehab Evaluation & Intervention   Assessment shows need for Vocational Rehabilitation No      Education: Education Goals: Education classes will be provided on a weekly basis, covering required topics. Participant will state understanding/return demonstration of topics  presented.  Learning Barriers/Preferences:     Learning Barriers/Preferences - 07/31/15 1800    Learning Barriers/Preferences   Learning Barriers None   Learning Preferences Written Material;Video;Group Instruction      Education Topics: General Nutrition Guidelines/Fats and Fiber: -Group instruction provided by verbal, written material, models and posters to present the general guidelines for heart healthy nutrition. Gives an explanation and review of dietary fats and fiber.   Controlling Sodium/Reading Food Labels: -Group verbal and written material supporting the discussion of sodium use in heart healthy nutrition. Review and explanation with models, verbal and written materials for utilization of the food label.   Exercise Physiology & Risk Factors: - Group verbal and written instruction with models to review the exercise physiology of the cardiovascular system and associated critical values. Details cardiovascular disease risk factors and the goals associated with each risk factor.          Cardiac Rehab from 08/21/2015 in [facility] Cardiac and Pulmonary Rehab   Date  08/12/15   Educator  sw   Instruction Review Code  2- meets goals/outcomes      Aerobic Exercise & Resistance Training: - Gives group verbal and written discussion on the health impact of inactivity. On the components of aerobic and resistive training programs and the benefits of this training and how to safely progress through these programs.      Cardiac Rehab from 08/21/2015 in [facility] Cardiac and Pulmonary Rehab   Date  08/14/15   Educator  B. Sickles   Instruction Review Code  2- meets goals/outcomes      Flexibility, Balance, General Exercise Guidelines: - Provides group verbal and written instruction on the benefits of flexibility and  balance training programs. Provides general exercise guidelines with specific guidelines to those with heart or lung disease. Demonstration and skill practice  provided.   Stress Management: - Provides group verbal and written instruction about the health risks of elevated stress, cause of high stress, and healthy ways to reduce stress.      Cardiac Rehab from 08/21/2015 in [facility] Cardiac and Pulmonary Rehab   Date  08/21/15   Educator  Kathreen Cornfield, [facility]   Instruction Review Code  2- meets goals/outcomes      Depression: - Provides group verbal and written instruction on the correlation between heart/lung disease and depressed mood, treatment options, and the stigmas associated with seeking treatment.      Cardiac Rehab from 08/21/2015 in [facility] Cardiac and Pulmonary Rehab   Date  08/07/15   Educator  Kathreen Cornfield, [facility]   Instruction Review Code  2- meets goals/outcomes      Anatomy & Physiology of the Heart: - Group verbal and written instruction and models provide basic cardiac anatomy and physiology, with the coronary electrical and arterial systems. Review of: AMI, Angina, Valve disease, Heart Failure, Cardiac Arrhythmia, Pacemakers, and the ICD.   Cardiac Procedures: - Group verbal and written instruction and models to describe the testing methods done to diagnose heart disease. Reviews the outcomes of the test results. Describes the treatment choices: Medical Management, Angioplasty, or Coronary Bypass Surgery.      Cardiac Rehab from 08/21/2015 in [facility] Cardiac and Pulmonary Rehab   Date  08/05/15   Educator  SB   Instruction Review Code  2- meets goals/outcomes      Cardiac Medications: - Group verbal and written instruction to review commonly prescribed medications for heart disease. Reviews the medication, class of the drug, and side effects. Includes the steps to properly store meds and maintain the prescription regimen.   Go Sex-Intimacy & Heart Disease, Get SMART - Goal Setting: - Group verbal and written instruction through game format to discuss heart disease and the return to sexual intimacy. Provides group verbal and written material  to discuss and apply goal setting through the application of the S.M.A.R.T. Method.      Cardiac Rehab from 08/21/2015 in [facility] Cardiac and Pulmonary Rehab   Date  08/05/15   Educator  SB   Instruction Review Code  2- meets goals/outcomes      Other Matters of the Heart: - Provides group verbal, written materials and models to describe Heart Failure, Angina, Valve Disease, and Diabetes in the realm of heart disease. Includes description of the disease process and treatment options available to the cardiac patient.   Exercise & Equipment Safety: - Individual verbal instruction and demonstration of equipment use and safety with use of the equipment.      Cardiac Rehab from 08/21/2015 in [facility] Cardiac and Pulmonary Rehab   Date  07/31/15   Educator  D. Joya Gaskins, RN   Instruction Review Code  1- partially meets, needs review/practice      Infection Prevention: - Provides verbal and written material to individual with discussion of infection control including proper hand washing and proper equipment cleaning during exercise session.      Cardiac Rehab from 08/21/2015 in [facility] Cardiac and Pulmonary Rehab   Date  07/31/15   Educator  D. Joya Gaskins, RN   Instruction Review Code  2- meets goals/outcomes      Falls Prevention: - Provides verbal and written material to individual with discussion of falls prevention and safety.  Cardiac Rehab from 08/21/2015 in [facility] Cardiac and Pulmonary Rehab   Date  07/31/15   Educator  D. Joya Gaskins, RN   Instruction Review Code  2- meets goals/outcomes      Diabetes: - Individual verbal and written instruction to review signs/symptoms of diabetes, desired ranges of glucose level fasting, after meals and with exercise. Advice that pre and post exercise glucose checks will be done for 3 sessions at entry of program.    Knowledge Questionnaire Score:     Knowledge Questionnaire Score - 07/31/15 1800    Knowledge Questionnaire Score   Pre Score 24/28       Personal Goals and Risk Factors at Admission:     Personal Goals and Risk Factors at Admission - 07/31/15 1815    Core Components/Risk Factors/Patient Goals on Admission    Weight Management Yes   Intervention (read-only) Learn and follow the exercise and diet guidelines while in the program. Utilize the nutrition and education classes to help gain knowledge of the diet and exercise expectations in the program   Admit Weight 205 lb (92.987 kg)   Goal Weight: Short Term 165 lb (74.844 kg)   Sedentary Yes   Intervention (read-only) While in program, learn and follow the exercise prescription taught. Start at a low level workload and increase workload after able to maintain previous level for 30 minutes. Increase time before increasing intensity.   Diabetes No   Hypertension Yes   Goal Participant will see blood pressure controlled within the values of 140/48m/Hg or within value directed by their physician.   Intervention (read-only) Provide nutrition & aerobic exercise along with prescribed medications to achieve BP 140/90 or less.   Lipids Yes   Goal Cholesterol controlled with medications as prescribed, with individualized exercise RX and with personalized nutrition plan. Value goals: LDL < 788m HDL > 4022mParticipant states understanding of desired cholesterol values and following prescriptions.   Intervention (read-only) Provide nutrition & aerobic exercise along with prescribed medications to achieve LDL <49m64mDL >40mg33mStress Yes   Goal To meet with psychosocial counselor for stress and relaxation information and guidance. To state understanding of performing relaxation techniques and or identifying personal stressors.   Intervention (read-only) Provide education on types of stress, identifiying stressors, and ways to cope with stress. Provide demonstration and active practice of relaxation techniques.   Take Less Medication Yes   Intervention Learn your risk factors and begin  the lifestyle modifications for risk factor control during your time in the program.      Personal Goals and Risk Factors Review:      Goals and Risk Factor Review      08/26/15 1659 08/28/15 1736         Core Components/Risk Factors/Patient Goals Review   Personal Goals Review Sedentary;Hypertension Sedentary;Hypertension      [facility] since last night and couldn't lie flat. Pulse ox room air in Cardiac 97% Bl 168/98. No chest pain.  Per [facility] note 08/27/2015:Pertinent labs & imaging results that were available during my care of the patient were reviewed by me and considered in my medical decision making (see chart for details).      Expected Outcomes Controlled blood pressure and no SOB Decreased SOB and no chest pain.         Personal Goals Discharge (Final Personal Goals and Risk Factors Review):      Goals and Risk Factor  Review - 08/28/15 1736    Core Components/Risk Factors/Patient Goals Review   Personal Goals Review Sedentary;Hypertension   Review Per [place] Dept note 08/27/2015:Pertinent labs & imaging results that were available during my care of the patient were reviewed by me and considered in my medical decision making (see chart for details).   Expected Outcomes Decreased SOB and no chest pain.      ITP Comments:     ITP Comments      08/06/15 0731 08/19/15 1356 08/26/15 1658 08/28/15 1737     ITP Comments 30 day review.  Continue with ITP  new to program, to this date he has completed orientation and attended one session.  Emrik is experiencing mild to severe pain related to the surgery. He will approach the exercises with caution and will not use weignts until he sees some relief with the muscle and chest wall pain.  Abraham left a vm today that he will not be able to attend Cardiac Rehab this evening but hopes to return on Wednesday.  Axiel taken to [facility] for c/o shortness of breath  since last night and couldn't lie flat. Pulse ox room air in Cardiac 97% Bl 168/98. No chest pain.  Gay was seen in [facility] yesterday for SOB all day not able to lie flat. Not acute SOB in Cardiac Rehab but did not have him exercise on 08/27/2015 but taken to [facility] to be evaluated. Discharged to home-see EPIC note.        Comments: [facility] note from 08/26/2015: Pertinent labs & imaging results that were available during my care of the patient were reviewed by me and considered in my medical decision making (see chart for details).  Patient presents with worsening shortness of breath and decreased exercise tolerance and chronic chest pain that is likely related to his postsurgical changes. Extensive workup today including troponin and imaging and labs all unremarkable. Vital signs are unremarkable except for some elevated blood pressure. Patient counseled extensively on following up with his cardiology team. Suspect that the pain is related to some scar tissue formation or other healing process with his sternotomy but this is nonspecific and I suggested the patient follow up closely with his cardiology team for consideration of a repeat echocardiogram. Does not appear to be in overt heart failure at this time, no evidence of pulmonary edema or respiratory distress. Stable for discharge home.

## 2015-08-29 DIAGNOSIS — Z951 Presence of aortocoronary bypass graft: Secondary | ICD-10-CM | POA: Diagnosis not present

## 2015-08-29 NOTE — Progress Notes (Signed)
Daily Session Note  Patient Details  Name: Jonathon Newman MRN: [id number] Date of Birth: 03-09-1956 Referring Provider:  [facility]  Encounter Date: 08/29/2015  Check In:     Session Check In - 08/29/15 1610    Check-In   Location [facility]   Staff Present Gerlene Burdock, RN, BSN;Hadley Soileau, BS, ACSM EP-C, Exercise Physiologist;Rebecca Brayton El, DPT, CEEA   Supervising physician immediately available to respond to emergencies See telemetry face sheet for immediately available ER MD   Medication changes reported     No   Fall or balance concerns reported    No   Warm-up and Cool-down Performed on first and last piece of equipment   Resistance Training Performed No   VAD Patient? No   Pain Assessment   Currently in Pain? Yes   Pain Score 3    Pain Location Chest   Pain Descriptors / Indicators Constant         Goals Met:  Proper associated with RPD/PD & O2 Sat Exercise tolerated well No report of cardiac concerns or symptoms Strength training completed today  Goals Unmet:  Not Applicable  Comments:   Dr. Emily Filbert is Medical Director for [place] [facility].

## 2015-09-02 DIAGNOSIS — Z951 Presence of aortocoronary bypass graft: Secondary | ICD-10-CM

## 2015-09-02 NOTE — Progress Notes (Signed)
Daily Session Note  Patient Details  Name: Jonathon Newman MRN: [id number] Date of Birth: 02-17-1956 Referring Provider:  [facility]  Encounter Date: 09/02/2015  Check In:     Session Check In - 09/02/15 1622    Check-In   Location [facility]   Staff Present Heath Lark, RN, BSN, CCRP;Carroll Enterkin, RN, [employer], BS, ACSM EP-C, Exercise Physiologist   Supervising physician immediately available to respond to emergencies See telemetry face sheet for immediately available ER MD   Medication changes reported     No   Fall or balance concerns reported    No   Warm-up and Cool-down Performed on first and last piece of equipment   Resistance Training Performed No   VAD Patient? No   Pain Assessment   Currently in Pain? Yes   Pain Score 3    Pain Location Chest   Pain Type Chronic pain         Goals Met:  Proper associated with RPD/PD & O2 Sat Exercise tolerated well No report of cardiac concerns or symptoms Strength training completed today  Goals Unmet:  Not Applicable  Comments:  Dr. Emily Filbert is Medical Director for [place] [facility].

## 2015-09-05 ENCOUNTER — Encounter: Payer: Self-pay | Admitting: *Deleted

## 2015-09-05 DIAGNOSIS — Z951 Presence of aortocoronary bypass graft: Secondary | ICD-10-CM

## 2015-09-05 NOTE — Progress Notes (Signed)
Cardiac Individual Treatment Plan  Patient Details  Name: Jonathon Newman MRN: [id number] Date of Birth: 1955-10-22 Referring Provider:  [facility]  Initial Encounter Date:       Cardiac Rehab from 07/31/2015 in [facility] Cardiac and Pulmonary Rehab   Date  07/31/15      Visit Diagnosis: S/P CABG x 2  Patient's Home Medications on Admission:  Current outpatient prescriptions:  .  aspirin 81 MG tablet, Take 81 mg by mouth daily., Disp: , Rfl:  .  atorvastatin (LIPITOR) 80 MG tablet, Take 80 mg by mouth daily., Disp: , Rfl:  .  carvedilol (COREG) 6.25 MG tablet, Take 6.25 mg by mouth 2 (two) times daily with a meal., Disp: , Rfl:  .  cyclobenzaprine (FLEXERIL) 10 MG tablet, Take 10 mg by mouth 3 (three) times daily as needed for muscle spasms., Disp: , Rfl:  .  docusate sodium (COLACE) 100 MG capsule, Take 100 mg by mouth 2 (two) times daily., Disp: , Rfl:  .  furosemide (LASIX) 20 MG tablet, Take 20 mg by mouth 3 (three) times daily., Disp: , Rfl:  .  gabapentin (NEURONTIN) 300 MG capsule, Take 300 mg by mouth 3 (three) times daily., Disp: , Rfl:  .  HYDROcodone-acetaminophen (NORCO) 10-325 MG tablet, Take 1 tablet by mouth every 6 (six) hours as needed., Disp: , Rfl:  .  losartan (COZAAR) 50 MG tablet, Take 50 mg by mouth daily., Disp: , Rfl:  .  pantoprazole (PROTONIX) 20 MG tablet, Take 20 mg by mouth daily., Disp: , Rfl:  .  predniSONE (DELTASONE) 20 MG tablet, Take 2 tablets (40 mg total) by mouth daily., Disp: 8 tablet, Rfl: 0  Past Medical History: Past Medical History  Diagnosis Date  . Hypertension   . Hypercholesteremia   . S/P coronary artery bypass graft x 2     Tobacco Use: History  Smoking status  . Former Smoker  Smokeless tobacco  . Not on file    Labs: Recent Review Flowsheet Data    There is no flowsheet data to display.       Exercise Target Goals:    Exercise Program Goal: Individual exercise prescription set with THRR, safety &  activity barriers. Participant demonstrates ability to understand and report RPE using BORG scale, to self-measure pulse accurately, and to acknowledge the importance of the exercise prescription.  Exercise Prescription Goal: Starting with aerobic activity 30 plus minutes a day, 3 days per week for initial exercise prescription. Provide home exercise prescription and guidelines that participant acknowledges understanding prior to discharge.  Activity Barriers & Risk Stratification:     Activity Barriers & Cardiac Risk Stratification - 07/31/15 1756    Activity Barriers & Cardiac Risk Stratification   Activity Barriers Arthritis;Back Problems;Joint Problems;Neck/Spine Problems  Masaki has herniated discs at L3-5; s/p neck surgery for herniated disc in neck; right anterior lateral thigh numbness since CABG; Scheduled for MRI of back in next couple weeks to evaluate if right thigh pain/numbness coming from disc problems    Cardiac Risk Stratification High      6 Minute Walk:     6 Minute Walk      07/31/15 1440       6 Minute Walk   Phase Initial     Distance 1650 feet     Walk Time 6 minutes     # of Rest Breaks 0     MPH 3     RPE 12  Symptoms No     Resting HR 84 bpm     Resting BP 136/86 mmHg     Max Ex. HR 120 bpm     Max Ex. BP 164/90 mmHg        Initial Exercise Prescription:     Initial Exercise Prescription - 07/31/15 1500    Date of Initial Exercise Prescription   Date 07/31/15   Treadmill   MPH 2.8   Grade 0   Minutes 10   Bike   Level 0.4   Watts 15   Minutes 15   Recumbant Bike   Level 4   RPM 40   Watts 35   Minutes 15   NuStep   Level 3   Watts 40   Minutes 15   Arm Ergometer   Level 1   Watts 8   Minutes 10   Arm/Foot Ergometer   Level 4   Watts 12   Minutes 10   Cybex   Level 3   RPM 50   Minutes 15   Recumbant Elliptical   Level 2   RPM 40   Watts 15   Minutes 15   Elliptical   Level 1   Speed 3   Minutes 1   REL-XR    Level 3   Watts 45   Minutes 15   T5 Nustep   Level 2   Watts 20   Minutes 15   Biostep-RELP   Level 3   Watts 20   Minutes 15   Prescription Details   Frequency (times per week) 3   Duration Progress to 30 minutes of continuous aerobic without signs/symptoms of physical distress   Intensity   THRR REST +  30   Ratings of Perceived Exertion 11-15   Progression   Progression Continue progressive overload as per policy without signs/symptoms or physical distress.   Resistance Training   Training Prescription Yes   Weight 3   Reps 10-15      Perform Capillary Blood Glucose checks as needed.  Exercise Prescription Changes:     Exercise Prescription Changes      08/05/15 1700 08/14/15 1600 08/21/15 1109       Exercise Review   Progression   Yes     Response to Exercise   Blood Pressure (Admit)   138/82 mmHg     Blood Pressure (Exercise)   168/80 mmHg     Blood Pressure (Exit)   150/88 mmHg     Heart Rate (Admit)   77 bpm     Heart Rate (Exercise)   113 bpm     Heart Rate (Exit)   84 bpm     Rating of Perceived Exertion (Exercise)   12     Symptoms   None     Comments Patient attended first day of cardiac rehabilitation. Following exercise on treadmill, patient stated felt dizzy.  BP taken in sitting with reading of 128/78.  Given water with dizziness abating.  Resumed exercise on another piece of equipment.  No other adverse effects noted.  Reviewed individualized exercise prescription and made increases per departmental policy. Exercise increases were discussed with the patient and they were able to perform the new work loads without issue (no signs or symptoms).      Duration   Progress to 45 minutes of aerobic exercise without signs/symptoms of physical distress     Intensity   Rest + 30     Progression   Progression  Continue progressive overload as per policy without signs/symptoms or physical distress.     Resistance Training   Training Prescription  Yes Yes      Weight  5 5     Reps  10-15 10-15     Treadmill   MPH  2.8 2.8     Grade  0 0     Minutes  15 15     REL-XR   Level  5 6     Watts  45 90     Minutes  20 20        Exercise Comments:   Discharge Exercise Prescription (Final Exercise Prescription Changes):     Exercise Prescription Changes - 08/21/15 1109    Exercise Review   Progression Yes   Response to Exercise   Blood Pressure (Admit) 138/82 mmHg   Blood Pressure (Exercise) 168/80 mmHg   Blood Pressure (Exit) 150/88 mmHg   Heart Rate (Admit) 77 bpm   Heart Rate (Exercise) 113 bpm   Heart Rate (Exit) 84 bpm   Rating of Perceived Exertion (Exercise) 12   Symptoms None   Comments Reviewed individualized exercise prescription and made increases per departmental policy. Exercise increases were discussed with the patient and they were able to perform the new work loads without issue (no signs or symptoms).    Duration Progress to 45 minutes of aerobic exercise without signs/symptoms of physical distress   Intensity Rest + 30   Progression   Progression Continue progressive overload as per policy without signs/symptoms or physical distress.   Resistance Training   Training Prescription Yes   Weight 5   Reps 10-15   Treadmill   MPH 2.8   Grade 0   Minutes 15   REL-XR   Level 6   Watts 90   Minutes 20      Nutrition:  Target Goals: Understanding of nutrition guidelines, daily intake of sodium <1584m, cholesterol <2026m calories 30% from fat and 7% or less from saturated fats, daily to have 5 or more servings of fruits and vegetables.  Biometrics:     Pre Biometrics - 07/31/15 1439    Pre Biometrics   Height '5\' 7"'  (1.702 m)   Weight 205 lb (92.987 kg)   Waist Circumference 40.5 inches   Hip Circumference 40.5 inches   Waist to Hip Ratio 1 %   BMI (Calculated) 32.2       Nutrition Therapy Plan and Nutrition Goals:     Nutrition Therapy & Goals - 08/14/15 1611    Nutrition Therapy   Diet 1700kcal  DASH diet    Drug/Food Interactions Statins/Certain Fruits   Protein (specify units) 7-8oz   Fiber 30 grams   Whole Grain Foods 3 servings   Saturated Fats 13 max. grams   Fruits and Vegetables 5 servings/day   Sodium 1500 grams   Personal Nutrition Goals   Personal Goal #1 Drink more water each day -- at least 64 oz fluids daily   Personal Goal #2 Work to include fruits and vegetables on a more consistent basis   Personal Goal #3 Choose lean meats -- tuKuwaitersions of sausage, bacon, etc.   Comments Patient states he eats foods according to his taste any any particular time; for example, he might eat bananas 2-3 at a time for several days or weeks and then not eat any for 1-2 months.    Intervention Plan   Intervention Nutrition handout(s) given to patient.;Prescribe, educate and counsel regarding individualized specific  dietary modifications aiming towards targeted core components such as weight, hypertension, lipid management, diabetes, heart failure and other comorbidities.   Expected Outcomes Short Term Goal: A plan has been developed with personal nutrition goals set during dietitian appointment.      Nutrition Discharge: Rate Your Plate Scores:     Nutrition Assessments - 08/08/15 0849    Rate Your Plate Scores   Pre Score 58   Pre Score % 64 %      Nutrition Goals Re-Evaluation:   Psychosocial: Target Goals: Acknowledge presence or absence of depression, maximize coping skills, provide positive support system. Participant is able to verbalize types and ability to use techniques and skills needed for reducing stress and depression.  Initial Review & Psychosocial Screening:     Initial Psych Review & Screening - 07/31/15 1817    Initial Review   Current issues with Current Depression;Current Sleep Concerns;Current Stress Concerns   Comments Shanon Brow informed nurse that he has been having anxiety issues (had bypass surgery in November 2016). Solace described a couple of  episodes where he became really anxious and on edge and he needed to be alone during those times; Sharrod's mother passed away 3 weeks ago;    Family Dynamics   Good Support System? No   Comments reports having a close friend.  Harol's mother passed away 3 weeks ago; has 2 grown children and 8 grandkids and will soon have a great grandchild.  Donavan states he has never been on any medication for anxiety or depression.  In addition, prior to his heart bypass, Treveon was exercising 3 days a week for 2 hours prior to having heart  bypass.  Alcides states he has gained weight since his bypass surgery.     Barriers   Psychosocial barriers to participate in program The patient should benefit from training in stress management and relaxation.   Screening Interventions   Interventions Program counselor consult;Encouraged to exercise      Quality of Life Scores:     Quality of Life - 07/31/15 1826    Quality of Life Scores   Health/Function Pre 21.57 %   Socioeconomic Pre 18.57 %   Psych/Spiritual Pre 23.36 %   Family Pre 19.2 %   GLOBAL Pre 20.97 %      PHQ-9:     Recent Review Flowsheet Data    Depression screen [facility] 2/9 07/31/2015   Decreased Interest 2   Down, Depressed, Hopeless 2   PHQ - 2 Score 4   Altered sleeping 2   Tired, decreased energy 1   Change in appetite 0   Feeling bad or failure about yourself  1   Trouble concentrating 0   Moving slowly or fidgety/restless 0   Suicidal thoughts 0   PHQ-9 Score 8   Difficult doing work/chores Not difficult at all      Psychosocial Evaluation and Intervention:     Psychosocial Evaluation - 08/05/15 1613    Psychosocial Evaluation & Interventions   Interventions Encouraged to exercise with the program and follow exercise prescription;Stress management education;Relaxation education   Comments Counselor met with Mr. Gardella today for initial psychological evaluation.  He is a 60 year old who has a family history of heart disease  and had open heart surgery on 04/18/15. 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  Mr. Hy has sleep apnea as well as cardiac issues and  reports he has intermittent sleep often - although he uses his CPAP nightly.  He denies a history of depression, but has some current symptoms.  Counselor assessed for depression, with Mr. Trow reporting his mother just passed away 3 weeks ago and accompanied by his current health and job stress, it is impacting him at this time.   He reported his current mood as an "8" on a scale of 1-10 with "10" being the best mood at this time.   Mr. Netto has goals to lose weight and get back in shape so he can return to the gym where he was working out typically 3x/week last summer and early fall.  He is also encouraged to attend the stress management and depression psychoeducational components of this program and meet with the dietician to address his weight loss goals.     Continued Psychosocial Services Needed Yes  Mr. Krouse will benefit from the psychoeducational components of this program.  He also needs to meet with the dietician to address his weight loss goals.       Psychosocial Re-Evaluation:     Psychosocial Re-Evaluation      08/26/15 1700 08/28/15 1738         Psychosocial Re-Evaluation   Comments Germaine taken to [place] for c/o shortness of breath since last night and couldn't lie flat. Pulse ox room air in Cardiac 97% Bl 168/98. No chest pain.  I left vm to follow up with patient today. Was discharged to home from [facility] yesterday.          Vocational Rehabilitation: Provide vocational rehab assistance to qualifying candidates.   Vocational Rehab Evaluation & Intervention:     Vocational Rehab - 07/31/15 1803    Initial Vocational Rehab Evaluation & Intervention   Assessment shows need for Vocational Rehabilitation No       Education: Education Goals: Education classes will be provided on a weekly basis, covering required topics. Participant will state understanding/return demonstration of topics presented.  Learning Barriers/Preferences:     Learning Barriers/Preferences - 07/31/15 1800    Learning Barriers/Preferences   Learning Barriers None   Learning Preferences Written Material;Video;Group Instruction      Education Topics: General Nutrition Guidelines/Fats and Fiber: -Group instruction provided by verbal, written material, models and posters to present the general guidelines for heart healthy nutrition. Gives an explanation and review of dietary fats and fiber.   Controlling Sodium/Reading Food Labels: -Group verbal and written material supporting the discussion of sodium use in heart healthy nutrition. Review and explanation with models, verbal and written materials for utilization of the food label.   Exercise Physiology & Risk Factors: - Group verbal and written instruction with models to review the exercise physiology of the cardiovascular system and associated critical values. Details cardiovascular disease risk factors and the goals associated with each risk factor.          Cardiac Rehab from 09/02/2015 in [facility] Cardiac and Pulmonary Rehab   Date  08/12/15   Educator  sw   Instruction Review Code  2- meets goals/outcomes      Aerobic Exercise & Resistance Training: - Gives group verbal and written discussion on the health impact of inactivity. On the components of aerobic and resistive training programs and the benefits of this training and how to safely progress through these programs.      Cardiac Rehab from 09/02/2015 in [facility] Cardiac and Pulmonary Rehab   Date  08/14/15   Educator  Olegario Shearer   Instruction Review Code  2- meets goals/outcomes      Flexibility, Balance, General Exercise Guidelines: - Provides group verbal and written instruction on the benefits of  flexibility and balance training programs. Provides general exercise guidelines with specific guidelines to those with heart or lung disease. Demonstration and skill practice provided.   Stress Management: - Provides group verbal and written instruction about the health risks of elevated stress, cause of high stress, and healthy ways to reduce stress.      Cardiac Rehab from 09/02/2015 in [facility] Cardiac and Pulmonary Rehab   Date  08/21/15   Educator  Kathreen Cornfield, [facility]   Instruction Review Code  2- meets goals/outcomes      Depression: - Provides group verbal and written instruction on the correlation between heart/lung disease and depressed mood, treatment options, and the stigmas associated with seeking treatment.      Cardiac Rehab from 09/02/2015 in [facility] Cardiac and Pulmonary Rehab   Date  08/07/15   Educator  Kathreen Cornfield, [facility]   Instruction Review Code  2- meets goals/outcomes      Anatomy & Physiology of the Heart: - Group verbal and written instruction and models provide basic cardiac anatomy and physiology, with the coronary electrical and arterial systems. Review of: AMI, Angina, Valve disease, Heart Failure, Cardiac Arrhythmia, Pacemakers, and the ICD.   Cardiac Procedures: - Group verbal and written instruction and models to describe the testing methods done to diagnose heart disease. Reviews the outcomes of the test results. Describes the treatment choices: Medical Management, Angioplasty, or Coronary Bypass Surgery.      Cardiac Rehab from 09/02/2015 in [facility] Cardiac and Pulmonary Rehab   Date  09/02/15   Educator  CE   Instruction Review Code  2- meets goals/outcomes      Cardiac Medications: - Group verbal and written instruction to review commonly prescribed medications for heart disease. Reviews the medication, class of the drug, and side effects. Includes the steps to properly store meds and maintain the prescription regimen.   Go Sex-Intimacy & Heart Disease, Get SMART  - Goal Setting: - Group verbal and written instruction through game format to discuss heart disease and the return to sexual intimacy. Provides group verbal and written material to discuss and apply goal setting through the application of the S.M.A.R.T. Method.      Cardiac Rehab from 09/02/2015 in [facility] Cardiac and Pulmonary Rehab   Date  09/02/15   Educator  CE   Instruction Review Code  2- meets goals/outcomes      Other Matters of the Heart: - Provides group verbal, written materials and models to describe Heart Failure, Angina, Valve Disease, and Diabetes in the realm of heart disease. Includes description of the disease process and treatment options available to the cardiac patient.   Exercise & Equipment Safety: - Individual verbal instruction and demonstration of equipment use and safety with use of the equipment.      Cardiac Rehab from 09/02/2015 in [facility] Cardiac and Pulmonary Rehab   Date  07/31/15   Educator  D. Joya Gaskins, RN   Instruction Review Code  1- partially meets, needs review/practice      Infection Prevention: - Provides verbal and written material to individual with discussion of infection control including proper hand washing and proper equipment cleaning during exercise session.      Cardiac Rehab from 09/02/2015 in [facility] Cardiac and Pulmonary Rehab   Date  07/31/15   Educator  D. Joya Gaskins,  RN   Instruction Review Code  2- meets goals/outcomes      Falls Prevention: - Provides verbal and written material to individual with discussion of falls prevention and safety.      Cardiac Rehab from 09/02/2015 in [facility] Cardiac and Pulmonary Rehab   Date  07/31/15   Educator  D. Joya Gaskins, RN   Instruction Review Code  2- meets goals/outcomes      Diabetes: - Individual verbal and written instruction to review signs/symptoms of diabetes, desired ranges of glucose level fasting, after meals and with exercise. Advice that pre and post exercise glucose checks will be done for 3  sessions at entry of program.    Knowledge Questionnaire Score:     Knowledge Questionnaire Score - 07/31/15 1800    Knowledge Questionnaire Score   Pre Score 24/28      Core Components/Risk Factors/Patient Goals at Admission:     Personal Goals and Risk Factors at Admission - 07/31/15 1815    Core Components/Risk Factors/Patient Goals on Admission    Weight Management Yes   Intervention (read-only) Learn and follow the exercise and diet guidelines while in the program. Utilize the nutrition and education classes to help gain knowledge of the diet and exercise expectations in the program   Admit Weight 205 lb (92.987 kg)   Goal Weight: Short Term 165 lb (74.844 kg)   Sedentary Yes   Intervention (read-only) While in program, learn and follow the exercise prescription taught. Start at a low level workload and increase workload after able to maintain previous level for 30 minutes. Increase time before increasing intensity.   Diabetes No   Hypertension Yes   Goal Participant will see blood pressure controlled within the values of 140/66m/Hg or within value directed by their physician.   Intervention (read-only) Provide nutrition & aerobic exercise along with prescribed medications to achieve BP 140/90 or less.   Lipids Yes   Goal Cholesterol controlled with medications as prescribed, with individualized exercise RX and with personalized nutrition plan. Value goals: LDL < 774m HDL > 4054mParticipant states understanding of desired cholesterol values and following prescriptions.   Intervention (read-only) Provide nutrition & aerobic exercise along with prescribed medications to achieve LDL <100m41mDL >40mg49mStress Yes   Goal To meet with psychosocial counselor for stress and relaxation information and guidance. To state understanding of performing relaxation techniques and or identifying personal stressors.   Intervention (read-only) Provide education on types of stress, identifiying  stressors, and ways to cope with stress. Provide demonstration and active practice of relaxation techniques.   Take Less Medication Yes   Intervention Learn your risk factors and begin the lifestyle modifications for risk factor control during your time in the program.      Core Components/Risk Factors/Patient Goals Review:      Goals and Risk Factor Review      08/26/15 1659 08/28/15 1736         Core Components/Risk Factors/Patient Goals Review   Personal Goals Review Sedentary;Hypertension Sedentary;Hypertension      [facility] and couldn't lie flat. Pulse ox room air in Cardiac 97% Bl 168/98. No chest pain.  Per [facility] note 08/27/2015:Pertinent labs & imaging results that were available during my care of the patient were reviewed by me and considered in my medical decision making (see chart for details).      Expected Outcomes Controlled blood pressure and  no SOB Decreased SOB and no chest pain.         Core Components/Risk Factors/Patient Goals at Discharge (Final Review):      Goals and Risk Factor Review - 08/28/15 1736    Core Components/Risk Factors/Patient Goals Review   Personal Goals Review Sedentary;Hypertension   Review Per [place] Dept note 08/27/2015:Pertinent labs & imaging results that were available during my care of the patient were reviewed by me and considered in my medical decision making (see chart for details).   Expected Outcomes Decreased SOB and no chest pain.      ITP Comments:     ITP Comments      08/06/15 0731 08/19/15 1356 08/26/15 1658 08/28/15 1737 09/05/15 1535   ITP Comments 30 day review.  Continue with ITP  new to program, to this date he has completed orientation and attended one session.  Rush is experiencing mild to severe pain related to the surgery. He will approach the exercises with caution and will not use weignts until he sees some relief with the  muscle and chest wall pain.  Warrick left a vm today that he will not be able to attend Cardiac Rehab this evening but hopes to return on Wednesday.  Suren taken to [place] for c/o shortness of breath since last night and couldn't lie flat. Pulse ox room air in Cardiac 97% Bl 168/98. No chest pain.  Champ was seen in [place] yesterday for SOB all day not able to lie flat. Not acute SOB in Cardiac Rehab but did not have him exercise on 08/27/2015 but taken to [facility] to be evaluated. Discharged to home-see EPIC note.  30 Day Review. Continue with the ITP.      Comments:

## 2015-09-09 DIAGNOSIS — I10 Essential (primary) hypertension: Secondary | ICD-10-CM

## 2015-09-09 DIAGNOSIS — Z951 Presence of aortocoronary bypass graft: Secondary | ICD-10-CM

## 2015-09-09 NOTE — Progress Notes (Signed)
Daily Session Note  Patient Details  Name: Jonathon Newman MRN: [id number] Date of Birth: 03/02/1956 Referring Provider:  [facility]  Encounter Date: 09/09/2015  Check In:     Session Check In - 09/09/15 1613    Check-In   Location [facility]   Staff Present Susanne Bice, RN, BSN, CCRP;Carroll Enterkin, RN, BSN;Steven Way, BS, ACSM EP-C, Exercise Physiologist   Supervising physician immediately available to respond to emergencies See telemetry face sheet for immediately available ER MD   Medication changes reported     No   Fall or balance concerns reported    No   Warm-up and Cool-down Performed on first and last piece of equipment   Resistance Training Performed No   VAD Patient? No   Pain Assessment   Currently in Pain? No/denies         Goals Met:  Proper associated with RPD/PD & O2 Sat Exercise tolerated well No report of cardiac concerns or symptoms Strength training completed today  Goals Unmet:  Not Applicable  Comments:    Dr. Mark Miller is Medical Director for [facility] and [facility]. 

## 2015-09-12 DIAGNOSIS — Z951 Presence of aortocoronary bypass graft: Secondary | ICD-10-CM | POA: Diagnosis not present

## 2015-09-12 NOTE — Progress Notes (Signed)
Daily Session Note  Patient Details  Name: Jonathon Newman MRN: [id number] Date of Birth: 1956/02/19 Referring Provider:  [facility]  Encounter Date: 09/12/2015  Check In:     Session Check In - 09/12/15 1623    Check-In   Location [facility]   Staff Present Gerlene Burdock, RN, BSN;Dianna Deshler, BS, ACSM EP-C, Exercise Physiologist;Diane Joya Gaskins, RN, BSN   Supervising physician immediately available to respond to emergencies See telemetry face sheet for immediately available ER MD   Medication changes reported     No   Fall or balance concerns reported    No   Warm-up and Cool-down Performed on first and last piece of equipment   Resistance Training Performed No   VAD Patient? No   Pain Assessment   Currently in Pain? No/denies         Goals Met:  Proper associated with RPD/PD & O2 Sat Exercise tolerated well No report of cardiac concerns or symptoms Strength training completed today  Goals Unmet:  Not Applicable  Comments:    Dr. Emily Filbert is Medical Director for [place] and [facility].

## 2015-09-16 ENCOUNTER — Encounter: Payer: No Typology Code available for payment source | Attending: Internal Medicine

## 2015-09-16 DIAGNOSIS — G4733 Obstructive sleep apnea (adult) (pediatric): Secondary | ICD-10-CM | POA: Insufficient documentation

## 2015-09-16 DIAGNOSIS — Z951 Presence of aortocoronary bypass graft: Secondary | ICD-10-CM | POA: Diagnosis not present

## 2015-09-16 DIAGNOSIS — I10 Essential (primary) hypertension: Secondary | ICD-10-CM | POA: Insufficient documentation

## 2015-09-16 DIAGNOSIS — Z87891 Personal history of nicotine dependence: Secondary | ICD-10-CM | POA: Insufficient documentation

## 2015-09-16 DIAGNOSIS — E785 Hyperlipidemia, unspecified: Secondary | ICD-10-CM | POA: Diagnosis not present

## 2015-09-16 NOTE — Progress Notes (Signed)
Daily Session Note  Patient Details  Name: Derril Franek MRN: [id number] Date of Birth: 04/29/1956 Referring Provider:  [facility]  Encounter Date: 09/16/2015  Check In:     Session Check In - 09/16/15 1612    Check-In   Location [facility]   Staff Present Nyoka Cowden, RN;Carroll Enterkin, RN, BSN;Drelyn Pistilli, BS, ACSM EP-C, Exercise Physiologist   Supervising physician immediately available to respond to emergencies See telemetry face sheet for immediately available ER MD   Medication changes reported     No   Fall or balance concerns reported    No   Warm-up and Cool-down Performed on first and last piece of equipment   Resistance Training Performed No   VAD Patient? No   Pain Assessment   Currently in Pain? No/denies         Goals Met:  Proper associated with RPD/PD & O2 Sat Exercise tolerated well No report of cardiac concerns or symptoms Strength training completed today  Goals Unmet:  Not Applicable  Comments:    Dr. Emily Filbert is Medical Director for [place] and [facility].

## 2015-09-18 ENCOUNTER — Encounter: Payer: No Typology Code available for payment source | Admitting: *Deleted

## 2015-09-18 DIAGNOSIS — Z951 Presence of aortocoronary bypass graft: Secondary | ICD-10-CM | POA: Diagnosis not present

## 2015-09-18 NOTE — Progress Notes (Signed)
Daily Session Note  Patient Details  Name: Jonathon Newman MRN: [id number] Date of Birth: [birth year] Referring Provider:  [facility]  Encounter Date: 09/18/2015  Check In:     Session Check In - 09/18/15 1613    Check-In   Location [facility]   Staff Present Jeanell Sparrow, DPT, Tomi Bamberger, RN, BSN;Carroll Enterkin, RN, BSN   Supervising physician immediately available to respond to emergencies See telemetry face sheet for immediately available ER MD   Medication changes reported     No   Fall or balance concerns reported    No   Warm-up and Cool-down Performed on first and last piece of equipment   Resistance Training Performed No   VAD Patient? No   Pain Assessment   Currently in Pain? No/denies   Pain Score 0-No pain           Exercise Prescription Changes - 09/18/15 1600    Exercise Review   Progression Yes   Response to Exercise   Comments Reviewed individualized exercise prescription and made increases per departmental policy. Exercise increases were discussed with the patient and they were able to perform the new work loads without issue (no signs or symptoms).    Duration Progress to 45 minutes of aerobic exercise without signs/symptoms of physical distress   Intensity Rest + 30   Progression   Progression Continue progressive overload as per policy without signs/symptoms or physical distress.   Resistance Training   Training Prescription Yes   Weight 5   Reps 10-15   Treadmill   MPH 2.3   Grade 0   Minutes 15   REL-XR   Level 6   Watts 90   Minutes 20      Goals Met:  Independence with exercise equipment Exercise tolerated well Personal goals reviewed No report of cardiac concerns or symptoms Strength training completed today  Goals Unmet:  Not Applicable  Comments:  Patient completed exercise prescription and all exercise goals during rehab session. The exercise was tolerated well and the patient is  progressing in the program.    Dr. Emily Filbert is Medical Director for [place] and [facility].

## 2015-09-19 DIAGNOSIS — Z951 Presence of aortocoronary bypass graft: Secondary | ICD-10-CM | POA: Diagnosis not present

## 2015-09-19 NOTE — Progress Notes (Signed)
Daily Session Note  Patient Details  Name: Jonathon Newman MRN: [id number] Date of Birth: 02-Jan-1956 Referring Provider:  [facility]  Encounter Date: 09/19/2015  Check In:     Session Check In - 09/19/15 1611    Check-In   Location [facility]   Staff Present Gerlene Burdock, RN, BSN;Diane Joya Gaskins, RN, BSN;Yanissa Michalsky, BS, ACSM EP-C, Exercise Physiologist   Supervising physician immediately available to respond to emergencies See telemetry face sheet for immediately available ER MD   Medication changes reported     No   Fall or balance concerns reported    No   Warm-up and Cool-down Performed on first and last piece of equipment   Resistance Training Performed No   VAD Patient? No   Pain Assessment   Currently in Pain? No/denies         Goals Met:  Proper associated with RPD/PD & O2 Sat Exercise tolerated well No report of cardiac concerns or symptoms Strength training completed today  Goals Unmet:  Not Applicable  Comments:   Dr. Emily Filbert is Medical Director for [place] and [facility].

## 2015-09-23 DIAGNOSIS — Z951 Presence of aortocoronary bypass graft: Secondary | ICD-10-CM

## 2015-09-23 NOTE — Progress Notes (Signed)
Daily Session Note  Patient Details  Name: Landrum Carbonell MRN: [id number] Date of Birth: July 04, 1955 Referring Provider:  No ref. provider found  Encounter Date: 09/23/2015  Check In:     Session Check In - 09/23/15 1632    Check-In   Location [facility]   Staff Present Nyoka Cowden, Mauricia Area, BS, ACSM CEP, Exercise Physiologist;Lillymae Duet Brayton El, DPT, CEEA   Supervising physician immediately available to respond to emergencies See telemetry face sheet for immediately available ER MD   Medication changes reported     No   Fall or balance concerns reported    No   Warm-up and Cool-down Performed on first and last piece of equipment   Resistance Training Performed Yes   VAD Patient? No         Goals Met:  Independence with exercise equipment Exercise tolerated well No report of cardiac concerns or symptoms  Goals Unmet:  Not Applicable  Comments: Patient completed exercise prescription and all exercise goals during rehab session. The exercise was tolerated well and the patient is progressing in the program.    Dr. Emily Filbert is Medical Director for [place] and [facility].

## 2015-09-26 ENCOUNTER — Encounter: Payer: No Typology Code available for payment source | Admitting: *Deleted

## 2015-09-26 DIAGNOSIS — Z951 Presence of aortocoronary bypass graft: Secondary | ICD-10-CM | POA: Diagnosis not present

## 2015-09-26 NOTE — Progress Notes (Signed)
Daily Session Note  Patient Details  Name: Jonathon Newman MRN: [id number] Date of Birth: Oct 18, 1955 Referring Provider:    Encounter Date: 09/26/2015  Check In:     Session Check In - 09/26/15 1623    Check-In   Location [facility]   Staff Present Nyoka Cowden, RN;Carroll Enterkin, RN, Jana Half, RN, BSN   Supervising physician immediately available to respond to emergencies See telemetry face sheet for immediately available ER MD   Medication changes reported     No   Fall or balance concerns reported    No   Warm-up and Cool-down Performed on first and last piece of equipment   Resistance Training Performed Yes   VAD Patient? No   Pain Assessment   Currently in Pain? No/denies         Goals Met:  Independence with exercise equipment Exercise tolerated well No report of cardiac concerns or symptoms Strength training completed today  Goals Unmet:  Not Applicable  Comments:  Patient completed exercise prescription and all exercise goals during rehab session. The exercise was tolerated well and the patient is progressing in the program   Dr. Emily Filbert is Medical Director for [place] and [facility].

## 2015-09-30 DIAGNOSIS — Z951 Presence of aortocoronary bypass graft: Secondary | ICD-10-CM

## 2015-09-30 NOTE — Progress Notes (Signed)
Daily Session Note  Patient Details  Name: Jonathon Newman MRN: [id number] Date of Birth: 10/14/1955 Referring Provider:    Encounter Date: 09/30/2015  Check In:     Session Check In - 09/30/15 1633    Check-In   Location [facility]   Staff Present Nyoka Cowden, RN;Susanne Bice, RN, BSN, CCRP;Laryah Neuser Brayton El, DPT, CEEA   Supervising physician immediately available to respond to emergencies See telemetry face sheet for immediately available ER MD   Medication changes reported     No   Fall or balance concerns reported    No   Warm-up and Cool-down Performed on first and last piece of equipment   Resistance Training Performed Yes   VAD Patient? No         Goals Met:  Independence with exercise equipment Exercise tolerated well No report of cardiac concerns or symptoms Strength training completed today  Goals Unmet:  Not Applicable  Comments: Patient completed exercise prescription and all exercise goals during rehab session. The exercise was tolerated well and the patient is progressing in the program.    Dr. Emily Filbert is Medical Director for [place] and [facility].

## 2015-10-06 ENCOUNTER — Encounter: Payer: Self-pay | Admitting: *Deleted

## 2015-10-06 DIAGNOSIS — Z951 Presence of aortocoronary bypass graft: Secondary | ICD-10-CM

## 2015-10-06 NOTE — Progress Notes (Signed)
Cardiac Individual Treatment Plan  Patient Details  Name: Jonathon Newman MRN: [id number] Date of Birth: 1956/02/15 Referring Provider:    Initial Encounter Date:       Cardiac Rehab from 07/31/2015 in [facility] Cardiac and Pulmonary Rehab   Date  07/31/15      Visit Diagnosis: S/P CABG x 2  Patient's Home Medications on Admission:  Current outpatient prescriptions:  .  aspirin 81 MG tablet, Take 81 mg by mouth daily., Disp: , Rfl:  .  atorvastatin (LIPITOR) 80 MG tablet, Take 80 mg by mouth daily., Disp: , Rfl:  .  carvedilol (COREG) 6.25 MG tablet, Take 6.25 mg by mouth 2 (two) times daily with a meal., Disp: , Rfl:  .  cyclobenzaprine (FLEXERIL) 10 MG tablet, Take 10 mg by mouth 3 (three) times daily as needed for muscle spasms., Disp: , Rfl:  .  docusate sodium (COLACE) 100 MG capsule, Take 100 mg by mouth 2 (two) times daily., Disp: , Rfl:  .  furosemide (LASIX) 20 MG tablet, Take 20 mg by mouth 3 (three) times daily., Disp: , Rfl:  .  gabapentin (NEURONTIN) 300 MG capsule, Take 300 mg by mouth 3 (three) times daily., Disp: , Rfl:  .  HYDROcodone-acetaminophen (NORCO) 10-325 MG tablet, Take 1 tablet by mouth every 6 (six) hours as needed., Disp: , Rfl:  .  losartan (COZAAR) 50 MG tablet, Take 50 mg by mouth daily., Disp: , Rfl:  .  pantoprazole (PROTONIX) 20 MG tablet, Take 20 mg by mouth daily., Disp: , Rfl:  .  predniSONE (DELTASONE) 20 MG tablet, Take 2 tablets (40 mg total) by mouth daily., Disp: 8 tablet, Rfl: 0  Past Medical History: Past Medical History  Diagnosis Date  . Hypertension   . Hypercholesteremia   . S/P coronary artery bypass graft x 2     Tobacco Use: History  Smoking status  . Former Smoker  Smokeless tobacco  . Not on file    Labs: Recent Review Flowsheet Data    There is no flowsheet data to display.       Exercise Target Goals:    Exercise Program Goal: Individual exercise prescription set with THRR, safety & activity barriers. Participant  demonstrates ability to understand and report RPE using BORG scale, to self-measure pulse accurately, and to acknowledge the importance of the exercise prescription.  Exercise Prescription Goal: Starting with aerobic activity 30 plus minutes a day, 3 days per week for initial exercise prescription. Provide home exercise prescription and guidelines that participant acknowledges understanding prior to discharge.  Activity Barriers & Risk Stratification:     Activity Barriers & Cardiac Risk Stratification - 07/31/15 1756    Activity Barriers & Cardiac Risk Stratification   Activity Barriers Arthritis;Back Problems;Joint Problems;Neck/Spine Problems  Jonathon Newman has herniated discs at L3-5; s/p neck surgery for herniated disc in neck; right anterior lateral thigh numbness since CABG; Scheduled for MRI of back in next couple weeks to evaluate if right thigh pain/numbness coming from disc problems    Cardiac Risk Stratification High      6 Minute Walk:     6 Minute Walk      07/31/15 1440       6 Minute Walk   Phase Initial     Distance 1650 feet     Walk Time 6 minutes     # of Rest Breaks 0     MPH 3     RPE 12     Symptoms No  Resting HR 84 bpm     Resting BP 136/86 mmHg     Max Ex. HR 120 bpm     Max Ex. BP 164/90 mmHg        Initial Exercise Prescription:     Initial Exercise Prescription - 07/31/15 1500    Date of Initial Exercise RX and Referring Provider   Date 07/31/15   Treadmill   MPH 2.8   Grade 0   Minutes 10   Bike   Level 0.4   Watts 15   Minutes 15   Recumbant Bike   Level 4   RPM 40   Watts 35   Minutes 15   NuStep   Level 3   Watts 40   Minutes 15   Arm Ergometer   Level 1   Watts 8   Minutes 10   Arm/Foot Ergometer   Level 4   Watts 12   Minutes 10   Cybex   Level 3   RPM 50   Minutes 15   Recumbant Elliptical   Level 2   RPM 40   Watts 15   Minutes 15   Elliptical   Level 1   Speed 3   Minutes 1   REL-XR   Level 3   Watts  45   Minutes 15   T5 Nustep   Level 2   Watts 20   Minutes 15   Biostep-RELP   Level 3   Watts 20   Minutes 15   Prescription Details   Frequency (times per week) 3   Duration Progress to 30 minutes of continuous aerobic without signs/symptoms of physical distress   Intensity   THRR REST +  30   Ratings of Perceived Exertion 11-15   Progression   Progression Continue progressive overload as per policy without signs/symptoms or physical distress.   Resistance Training   Training Prescription Yes   Weight 3   Reps 10-15      Perform Capillary Blood Glucose checks as needed.  Exercise Prescription Changes:     Exercise Prescription Changes      08/05/15 1700 08/14/15 1600 08/21/15 1108 09/18/15 1600 09/30/15 1348   Exercise Review   Progression   Yes Yes Yes   Response to Exercise   Blood Pressure (Admit)   138/82 mmHg  142/78 mmHg   Blood Pressure (Exercise)   168/80 mmHg  174/80 mmHg   Blood Pressure (Exit)   150/88 mmHg  126/84 mmHg   Heart Rate (Admit)   77 bpm  92 bpm   Heart Rate (Exercise)   113 bpm  101 bpm   Heart Rate (Exit)   84 bpm  81 bpm   Rating of Perceived Exertion (Exercise)   12  11   Symptoms   None  None   Comments Patient attended first day of cardiac rehabilitation. Following exercise on treadmill, patient stated felt dizzy.  BP taken in sitting with reading of 128/78.  Given water with dizziness abating.  Resumed exercise on another piece of equipment.  No other adverse effects noted.  Reviewed individualized exercise prescription and made increases per departmental policy. Exercise increases were discussed with the patient and they were able to perform the new work loads without issue (no signs or symptoms).  Reviewed individualized exercise prescription and made increases per departmental policy. Exercise increases were discussed with the patient and they were able to perform the new work loads without issue (no signs or symptoms).  Duration    Progress to 45 minutes of aerobic exercise without signs/symptoms of physical distress Progress to 45 minutes of aerobic exercise without signs/symptoms of physical distress Progress to 50 minutes of aerobic without signs/symptoms of physical distress   Intensity   Rest + 30 Rest + 30 Rest + 30   Progression   Progression   Continue progressive overload as per policy without signs/symptoms or physical distress. Continue progressive overload as per policy without signs/symptoms or physical distress. Continue to progress workloads to maintain intensity without signs/symptoms of physical distress.   Resistance Training   Training Prescription  Yes Yes Yes Yes   Weight  _0 Reps  10-15 10-15 10-15 10-15   Interval Training   Interval Training     Yes   Equipment     REL-XR   Comments     L8-L10   Treadmill   MPH  2.8 2.8 2.3 3   Grade  0 0 0 3.5   Minutes  _1 REL-XR   Level  _2 Watts  45 90 90 120   Minutes  _3 Home Exercise Plan   Plans to continue exercise at     [facility] (comment)      Exercise Comments:     Exercise Comments      10/04/15 1353           Exercise Comments [place] attends Heart Track regularly, progressing intensity and duration.  He continues to voice discomfort which is localized to the  left breast area, stating his cardiologist relates it to residual healing from surgery.  Plan is to continue to progress with program and assist with transition to community exercise upon completion of Heart Track.          Discharge Exercise Prescription (Final Exercise Prescription Changes):     Exercise Prescription Changes - 09/30/15 1348    Exercise Review   Progression Yes   Response to Exercise   Blood Pressure (Admit) 142/78 mmHg   Blood Pressure (Exercise) 174/80 mmHg   Blood Pressure (Exit) 126/84 mmHg   Heart Rate (Admit) 92 bpm   Heart Rate (Exercise) 101 bpm   Heart Rate (Exit) 81 bpm   Rating of Perceived  Exertion (Exercise) 11   Symptoms None   Duration Progress to 50 minutes of aerobic without signs/symptoms of physical distress   Intensity Rest + 30   Progression   Progression Continue to progress workloads to maintain intensity without signs/symptoms of physical distress.   Resistance Training   Training Prescription Yes   Weight 5   Reps 10-15   Interval Training   Interval Training Yes   Equipment REL-XR   Comments L8-L10   Treadmill   MPH 3   Grade 3.5   Minutes 15   REL-XR   Level 10   Watts 120   Home Exercise Plan   Plans to continue exercise at [employer] (comment)      Nutrition:  Target Goals: Understanding of nutrition guidelines, daily intake of sodium <1561m, cholesterol <2012m calories 30% from fat and 7% or less from saturated fats, daily to have 5 or more servings of fruits and vegetables.  Biometrics:     Pre Biometrics - 07/31/15 1439    Pre Biometrics   Height 5' 7" (1.702 m)   Weight 205 lb (92.987 kg)   Waist Circumference 40.5 inches  Hip Circumference 40.5 inches   Waist to Hip Ratio 1 %   BMI (Calculated) 32.2       Nutrition Therapy Plan and Nutrition Goals:     Nutrition Therapy & Goals - 08/14/15 1611    Nutrition Therapy   Diet 1700kcal DASH diet    Drug/Food Interactions Statins/Newman Fruits   Protein (specify units) 7-8oz   Fiber 30 grams   Whole Grain Foods 3 servings   Saturated Fats 13 max. grams   Fruits and Vegetables 5 servings/day   Sodium 1500 grams   Personal Nutrition Goals   Personal Goal #1 Drink more water each day -- at least 64 oz fluids daily   Personal Goal #2 Work to include fruits and vegetables on a more consistent basis   Personal Goal #3 Choose lean meats -- [place] versions of sausage, bacon, etc.   Comments Patient states he eats foods according to his taste any any particular time; for example, he might eat bananas 2-3 at a time for several days or weeks and then not eat any for 1-2  months.    Intervention Plan   Intervention Nutrition handout(s) given to patient.;Prescribe, educate and counsel regarding individualized specific dietary modifications aiming towards targeted core components such as weight, hypertension, lipid management, diabetes, heart failure and other comorbidities.   Expected Outcomes Short Term Goal: A plan has been developed with personal nutrition goals set during dietitian appointment.      Nutrition Discharge: Rate Your Plate Scores:     Nutrition Assessments - 08/08/15 0849    Rate Your Plate Scores   Pre Score 58   Pre Score % 64 %      Nutrition Goals Re-Evaluation:   Psychosocial: Target Goals: Acknowledge presence or absence of depression, maximize coping skills, provide positive support system. Participant is able to verbalize types and ability to use techniques and skills needed for reducing stress and depression.  Initial Review & Psychosocial Screening:     Initial Psych Review & Screening - 07/31/15 1817    Initial Review   Current issues with Current Depression;Current Sleep Concerns;Current Stress Concerns   Comments Shanon Brow informed nurse that he has been having anxiety issues (had bypass surgery in November 2016). Jonathon Newman described a couple of episodes where he became really anxious and on edge and he needed to be alone during those times; Jonathon Newman's mother passed away 3 weeks ago;    Family Dynamics   Good Support System? No   Comments reports having a close friend.  Meet's mother passed away 3 weeks ago; has 2 grown children and 8 grandkids and will soon have a great grandchild.  Jonathon Newman states he has never been on any medication for anxiety or depression.  In addition, prior to his heart bypass, Jonathon Newman was exercising 3 days a week for 2 hours prior to having heart  bypass.  Jonathon Newman states he has gained weight since his bypass surgery.     Barriers   Psychosocial barriers to participate in program The patient should benefit from  training in stress management and relaxation.   Screening Interventions   Interventions Program counselor consult;Encouraged to exercise      Quality of Life Scores:     Quality of Life - 07/31/15 1826    Quality of Life Scores   Health/Function Pre 21.57 %   Socioeconomic Pre 18.57 %   Psych/Spiritual Pre 23.36 %   Family Pre 19.2 %   GLOBAL Pre 20.97 %  PHQ-9:     Recent Review Flowsheet Data    Depression screen [facility] 2/9 07/31/2015   Decreased Interest 2   Down, Depressed, Hopeless 2   PHQ - 2 Score 4   Altered sleeping 2   Tired, decreased energy 1   Change in appetite 0   Feeling bad or failure about yourself  1   Trouble concentrating 0   Moving slowly or fidgety/restless 0   Suicidal thoughts 0   PHQ-9 Score 8   Difficult doing work/chores Not difficult at all      Psychosocial Evaluation and Intervention:     Psychosocial Evaluation - 08/05/15 1613    Psychosocial Evaluation & Interventions   Interventions Encouraged to exercise with the program and follow exercise prescription;Stress management education;Relaxation education   Comments Counselor met with Jonathon Newman today for initial psychological evaluation.  He is a 60 year old who has a family history of heart disease and had open heart surgery on 04/18/15. 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  Jonathon Newman has sleep apnea as well as cardiac issues and reports he has intermittent sleep often - although he uses his CPAP nightly.  He denies a history of depression, but has some current symptoms.  Counselor assessed for depression, with Jonathon Newman reporting his mother just passed away 3 weeks ago and accompanied by his current health and job stress, it is impacting him at this time.   He reported his current mood as an "8" on a scale of 1-10 with "10" being the best mood at this time.   Jonathon Newman has goals to  lose weight and get back in shape so he can return to the gym where he was working out typically 3x/week last summer and early fall.  He is also encouraged to attend the stress management and depression psychoeducational components of this program and meet with the dietician to address his weight loss goals.     Continued Psychosocial Services Needed Yes  Jonathon Newman will benefit from the psychoeducational components of this program.  He also needs to meet with the dietician to address his weight loss goals.       Psychosocial Re-Evaluation:     Psychosocial Re-Evaluation      08/26/15 1700 08/28/15 1738         Psychosocial Re-Evaluation   Comments Boss taken to [place] for c/o shortness of breath since last night and couldn't lie flat. Pulse ox room air in Cardiac 97% Bl 168/98. No chest pain.  I left vm to follow up with patient today. Was discharged to home from [facility] yesterday.          Vocational Rehabilitation: Provide vocational rehab assistance to qualifying candidates.   Vocational Rehab Evaluation & Intervention:     Vocational Rehab - 07/31/15 1803    Initial Vocational Rehab Evaluation & Intervention   Assessment shows need for Vocational Rehabilitation No      Education: Education Goals: Education classes will be provided on a weekly basis, covering required topics. Participant will state understanding/return demonstration of topics presented.  Learning Barriers/Preferences:     Learning Barriers/Preferences - 07/31/15 1800    Learning Barriers/Preferences   Learning Barriers None   Learning Preferences Written Material;Video;Group Instruction      Education Topics: General Nutrition Guidelines/Fats and Fiber: -Group instruction provided by verbal, written material, models and  posters to present the general guidelines for heart healthy nutrition. Gives an explanation and review of dietary fats and fiber.          Cardiac Rehab from  09/18/2015 in [facility] Cardiac and Pulmonary Rehab   Date  09/16/15   Educator  Jonathon Newman   Instruction Review Code  2- meets goals/outcomes      Controlling Sodium/Reading Food Labels: -Group verbal and written material supporting the discussion of sodium use in heart healthy nutrition. Review and explanation with models, verbal and written materials for utilization of the food label.   Exercise Physiology & Risk Factors: - Group verbal and written instruction with models to review the exercise physiology of the cardiovascular system and associated critical values. Details cardiovascular disease risk factors and the goals associated with each risk factor.      Cardiac Rehab from 09/18/2015 in [facility] Cardiac and Pulmonary Rehab   Date  08/12/15   Educator  Jonathon Newman   Instruction Review Code  2- meets goals/outcomes      Aerobic Exercise & Resistance Training: - Gives group verbal and written discussion on the health impact of inactivity. On the components of aerobic and resistive training programs and the benefits of this training and how to safely progress through these programs.      Cardiac Rehab from 09/18/2015 in [facility] Cardiac and Pulmonary Rehab   Date  08/14/15   Educator  Jonathon Newman   Instruction Review Code  2- meets goals/outcomes      Flexibility, Balance, General Exercise Guidelines: - Provides group verbal and written instruction on the benefits of flexibility and balance training programs. Provides general exercise guidelines with specific guidelines to those with heart or lung disease. Demonstration and skill practice provided.   Stress Management: - Provides group verbal and written instruction about the health risks of elevated stress, cause of high stress, and healthy ways to reduce stress.      Cardiac Rehab from 09/18/2015 in [facility] Cardiac and Pulmonary Rehab   Date  08/21/15   Educator  Jonathon Newman, [facility]   Instruction Review Code  2- meets goals/outcomes      Depression: - Provides  group verbal and written instruction on the correlation between heart/lung disease and depressed mood, treatment options, and the stigmas associated with seeking treatment.      Cardiac Rehab from 09/18/2015 in [facility] Cardiac and Pulmonary Rehab   Date  09/18/15   Educator  Jonathon Newman, [facility]   Instruction Review Code  2- meets goals/outcomes      Anatomy & Physiology of the Heart: - Group verbal and written instruction and models provide basic cardiac anatomy and physiology, with the coronary electrical and arterial systems. Review of: AMI, Angina, Valve disease, Heart Failure, Cardiac Arrhythmia, Pacemakers, and the ICD.   Cardiac Procedures: - Group verbal and written instruction and models to describe the testing methods done to diagnose heart disease. Reviews the outcomes of the test results. Describes the treatment choices: Medical Management, Angioplasty, or Coronary Bypass Surgery.      Cardiac Rehab from 09/18/2015 in [facility] Cardiac and Pulmonary Rehab   Date  09/02/15   Educator  CE   Instruction Review Code  2- meets goals/outcomes      Cardiac Medications: - Group verbal and written instruction to review commonly prescribed medications for heart disease. Reviews the medication, class of the drug, and side effects. Includes the steps to properly store meds and maintain the prescription regimen.   Go Sex-Intimacy & Heart  Disease, Get SMART - Goal Setting: - Group verbal and written instruction through game format to discuss heart disease and the return to sexual intimacy. Provides group verbal and written material to discuss and apply goal setting through the application of the S.M.A.R.T. Method.      Cardiac Rehab from 09/18/2015 in [facility] Cardiac and Pulmonary Rehab   Date  09/02/15   Educator  CE   Instruction Review Code  2- meets goals/outcomes      Other Matters of the Heart: - Provides group verbal, written materials and models to describe Heart Failure, Angina, Valve Disease,  and Diabetes in the realm of heart disease. Includes description of the disease process and treatment options available to the cardiac patient.   Exercise & Equipment Safety: - Individual verbal instruction and demonstration of equipment use and safety with use of the equipment.      Cardiac Rehab from 09/18/2015 in [facility] Cardiac and Pulmonary Rehab   Date  07/31/15   Educator  D. Joya Gaskins, RN   Instruction Review Code  1- partially meets, needs review/practice      Infection Prevention: - Provides verbal and written material to individual with discussion of infection control including proper hand washing and proper equipment cleaning during exercise session.      Cardiac Rehab from 09/18/2015 in [facility] Cardiac and Pulmonary Rehab   Date  07/31/15   Educator  D. Joya Gaskins, RN   Instruction Review Code  2- meets goals/outcomes      Falls Prevention: - Provides verbal and written material to individual with discussion of falls prevention and safety.      Cardiac Rehab from 09/18/2015 in [facility] Cardiac and Pulmonary Rehab   Date  07/31/15   Educator  D. Joya Gaskins, RN   Instruction Review Code  2- meets goals/outcomes      Diabetes: - Individual verbal and written instruction to review signs/symptoms of diabetes, desired ranges of glucose level fasting, after meals and with exercise. Advice that pre and post exercise glucose checks will be done for 3 sessions at entry of program.    Knowledge Questionnaire Score:     Knowledge Questionnaire Score - 07/31/15 1800    Knowledge Questionnaire Score   Pre Score 24/28      Core Components/Risk Factors/Patient Goals at Admission:     Personal Goals and Risk Factors at Admission - 07/31/15 1815    Core Components/Risk Factors/Patient Goals on Admission    Weight Management Yes   Intervention (read-only) Learn and follow the exercise and diet guidelines while in the program. Utilize the nutrition and education classes to help gain knowledge of the  diet and exercise expectations in the program   Admit Weight 205 lb (92.987 kg)   Goal Weight: Short Term 165 lb (74.844 kg)   Sedentary Yes   Intervention (read-only) While in program, learn and follow the exercise prescription taught. Start at a low level workload and increase workload after able to maintain previous level for 30 minutes. Increase time before increasing intensity.   Diabetes No   Hypertension Yes   Goal Participant will see blood pressure controlled within the values of 140/90m/Hg or within value directed by their physician.   Intervention (read-only) Provide nutrition & aerobic exercise along with prescribed medications to achieve BP 140/90 or less.   Lipids Yes   Goal Cholesterol controlled with medications as prescribed, with individualized exercise RX and with personalized nutrition plan. Value goals: LDL < 732m HDL > 4025mParticipant states understanding  of desired cholesterol values and following prescriptions.   Intervention (read-only) Provide nutrition & aerobic exercise along with prescribed medications to achieve LDL <82m, HDL >428m   Stress Yes   Goal To meet with psychosocial counselor for stress and relaxation information and guidance. To state understanding of performing relaxation techniques and or identifying personal stressors.   Intervention (read-only) Provide education on types of stress, identifiying stressors, and ways to cope with stress. Provide demonstration and active practice of relaxation techniques.   Take Less Medication Yes   Intervention Learn your risk factors and begin the lifestyle modifications for risk factor control during your time in the program.      Core Components/Risk Factors/Patient Goals Review:      Goals and Risk Factor Review      08/26/15 1659 08/28/15 1736 09/18/15 1737       Core Components/Risk Factors/Patient Goals Review   Personal Goals Review Sedentary;Hypertension Sedentary;Hypertension Hypertension      [place] of breath since last night and couldn't lie flat. Pulse ox room air in Cardiac 97% Bl 168/98. No chest pain.  Per ARMaunieept note 08/27/2015:Pertinent labs & imaging results that were available during my care of the patient were reviewed by me and considered in my medical decision making (see chart for details). [facility] 160/110.  Coreg dose doubled to 12.50 orally twice a day.  Today's BP upon check in was 132/74.  DaAntronetates he could tell his BP has been elevated at times as he feels like his head was going to explode.  He would have it checked somewhere and  the diastolic would be 20076 He stated he has not had these episodes since starting Coreg 12.50 mg twice a day.  Exit BP today was 136/74.       Expected Outcomes Controlled blood pressure and no SOB Decreased SOB and no chest pain. DaAhmodill see BP controlled within the values of 140/90 mmg Hg or within detected value directed by the physician.          Core Components/Risk Factors/Patient Goals at Discharge (Final Review):      Goals and Risk Factor Review - 09/18/15 1737    Core Components/Risk Factors/Patient Goals Review   Personal Goals Review Hypertension   Review [facility]/110.  Coreg dose doubled to 12.50 orally twice a day.  Today's BP upon check in was 132/74.  DaSherrodtates he could tell his BP has been elevated at times as he feels like his head was going to explode.  He would have it checked somewhere and  the diastolic would be 20226 He stated he has not had these episodes since starting Coreg 12.50 mg twice a day.  Exit BP today was 136/74.     Expected Outcomes DaTerrianill see BP controlled within the values of 140/90 mmg Hg or within detected value directed by the physician.        ITP Comments:     ITP Comments      08/06/15  0731 08/19/15 1356 08/26/15 1658 08/28/15 1737 09/05/15 1535   ITP Comments 30 day review.  Continue with ITP  new to program, to this date he has completed orientation and attended one session.  DaMikkos experiencing mild to severe pain related to the surgery.  He will approach the exercises with caution and will not use weignts until he sees some relief with the muscle and chest wall pain.  Elhadj left a vm today that he will not be able to attend Cardiac Rehab this evening but hopes to return on Wednesday.  Kahleb taken to [place] for c/o shortness of breath since last night and couldn't lie flat. Pulse ox room air in Cardiac 97% Bl 168/98. No chest pain.  Chastin was seen in [place] yesterday for SOB all day not able to lie flat. Not acute SOB in Cardiac Rehab but did not have him exercise on 08/27/2015 but taken to Emerg Dept to be evaluated. Discharged to home-see EPIC note.  30 Day Review. Continue with the ITP.     10/06/15 1035           ITP Comments 30 Day review. Continue with ITP          Comments:

## 2015-10-07 ENCOUNTER — Encounter: Payer: No Typology Code available for payment source | Admitting: *Deleted

## 2015-10-07 DIAGNOSIS — Z951 Presence of aortocoronary bypass graft: Secondary | ICD-10-CM

## 2015-10-07 NOTE — Progress Notes (Signed)
Daily Session Note  Patient Details  Name: Jonathon Newman MRN: [id number] Date of Birth: Oct 22, 1955 Referring Provider:    Encounter Date: 10/07/2015  Check In:     Session Check In - 10/07/15 1629    Check-In   Staff Present Heath Lark, RN, BSN, CCRP;Rebecca Sickles, DPT, [place] physician immediately available to respond to emergencies See telemetry face sheet for immediately available ER MD   Medication changes reported     No   Fall or balance concerns reported    No   Warm-up and Cool-down Performed on first and last piece of equipment   VAD Patient? No   Pain Assessment   Currently in Pain? No/denies         Goals Met:  Exercise tolerated well No report of cardiac concerns or symptoms  Goals Unmet:  Not Applicable  Comments: Doing well with exercise prescription progression.    Dr. Emily Filbert is Medical Director for [place] and [facility].

## 2015-10-10 ENCOUNTER — Encounter: Payer: No Typology Code available for payment source | Admitting: *Deleted

## 2015-10-10 DIAGNOSIS — Z951 Presence of aortocoronary bypass graft: Secondary | ICD-10-CM

## 2015-10-10 NOTE — Progress Notes (Signed)
Daily Session Note  Patient Details  Name: Jonathon Newman MRN: [id number] Date of Birth: 01/15/1956 Referring Provider:    Encounter Date: 10/10/2015  Check In:     Session Check In - 10/10/15 1700    Check-In   Location [facility]   Staff Present Nyoka Cowden, RN;Susanne Bice, RN, BSN, CCRP;Jackline Castilla Joya Gaskins, RN, BSN   Supervising physician immediately available to respond to emergencies See telemetry face sheet for immediately available ER MD   Medication changes reported     No   Fall or balance concerns reported    No   Warm-up and Cool-down Performed on first and last piece of equipment   Resistance Training Performed Yes   VAD Patient? No   Pain Assessment   Currently in Pain? No/denies           Exercise Prescription Changes - 10/10/15 1700    Exercise Review   Progression Yes   Response to Exercise   Duration Progress to 50 minutes of aerobic without signs/symptoms of physical distress   Intensity Rest + 30   Progression   Progression Continue to progress workloads to maintain intensity without signs/symptoms of physical distress.   Resistance Training   Training Prescription Yes   Weight 5   Reps 10-15   Interval Training   Interval Training Yes   Equipment REL-XR   Comments L8-L10   Treadmill   MPH 3.5   Grade 2   Minutes 15   REL-XR   Level 10   Watts 120   Home Exercise Plan   Plans to continue exercise at [employer] (comment)      Goals Met:  Independence with exercise equipment Exercise tolerated well Personal goals reviewed No report of cardiac concerns or symptoms Strength training completed today  Goals Unmet:  Not Applicable  Comments:  Patient completed exercise prescription and all exercise goals during rehab session. The exercise was tolerated well and the patient is progressing in the program.    Dr. Emily Filbert is Medical Director for [place] and [facility].

## 2015-10-14 ENCOUNTER — Encounter: Payer: No Typology Code available for payment source | Attending: Internal Medicine

## 2015-10-14 DIAGNOSIS — I10 Essential (primary) hypertension: Secondary | ICD-10-CM | POA: Diagnosis not present

## 2015-10-14 DIAGNOSIS — G4733 Obstructive sleep apnea (adult) (pediatric): Secondary | ICD-10-CM | POA: Diagnosis not present

## 2015-10-14 DIAGNOSIS — E785 Hyperlipidemia, unspecified: Secondary | ICD-10-CM | POA: Insufficient documentation

## 2015-10-14 DIAGNOSIS — Z87891 Personal history of nicotine dependence: Secondary | ICD-10-CM | POA: Diagnosis not present

## 2015-10-14 DIAGNOSIS — Z951 Presence of aortocoronary bypass graft: Secondary | ICD-10-CM | POA: Diagnosis present

## 2015-10-14 NOTE — Progress Notes (Signed)
Daily Session Note  Patient Details  Name: Jonathon Newman MRN: [id number] Date of Birth: 11/13/1955 Referring Provider:    Encounter Date: 10/14/2015  Check In:     Session Check In - 10/14/15 1710    Check-In   Location [facility]   Staff Present Susanne Bice, RN, BSN, CCRP;Kelly Hayes, BS, ACSM CEP, Exercise Physiologist;Rebecca Sickles, DPT, CEEA   Supervising physician immediately available to respond to emergencies See telemetry face sheet for immediately available ER MD   Medication changes reported     No   Fall or balance concerns reported    No   Warm-up and Cool-down Performed on first and last piece of equipment   Resistance Training Performed Yes   VAD Patient? No         Goals Met:  Independence with exercise equipment Exercise tolerated well No report of cardiac concerns or symptoms  Goals Unmet:  Not Applicable  Comments: Patient completed exercise prescription and all exercise goals during rehab session. The exercise was tolerated well and the patient is progressing in the program.    Dr. Mark Miller is Medical Director for [facility] and [facility]. 

## 2015-10-17 ENCOUNTER — Encounter: Payer: No Typology Code available for payment source | Admitting: *Deleted

## 2015-10-17 DIAGNOSIS — Z951 Presence of aortocoronary bypass graft: Secondary | ICD-10-CM | POA: Diagnosis not present

## 2015-10-17 NOTE — Progress Notes (Signed)
Daily Session Note  Patient Details  Name: Jonathon Newman MRN: [id number] Date of Birth: Oct 11, 1955 Referring Provider:    Encounter Date: 10/17/2015  Check In:     Session Check In - 10/17/15 1701    Check-In   Location [facility]   Staff Present Heath Lark, RN, BSN, CCRP;Karthika Glasper, RN, Jana Half, RN, BSN   Supervising physician immediately available to respond to emergencies See telemetry face sheet for immediately available ER MD   Medication changes reported     No   Fall or balance concerns reported    No   Warm-up and Cool-down Performed on first and last piece of equipment   Resistance Training Performed Yes   VAD Patient? No   Pain Assessment   Currently in Pain? No/denies         Goals Met:  Proper associated with RPD/PD & O2 Sat Exercise tolerated well  Goals Unmet:  Not Applicable  Comments:     Dr. Emily Filbert is Medical Director for [place] and [facility].

## 2015-10-24 ENCOUNTER — Encounter: Payer: No Typology Code available for payment source | Admitting: *Deleted

## 2015-10-24 DIAGNOSIS — Z951 Presence of aortocoronary bypass graft: Secondary | ICD-10-CM | POA: Diagnosis not present

## 2015-10-24 NOTE — Progress Notes (Signed)
Daily Session Note  Patient Details  Name: Daxten Kovalenko MRN: [id number] Date of Birth: 25-Nov-1955 Referring Provider:    Encounter Date: 10/24/2015  Check In:     Session Check In - 10/24/15 1658    Check-In   Location [facility]   Staff Present Gerlene Burdock, RN, Alex Gardener, DPT, Tomi Bamberger, RN, BSN   Supervising physician immediately available to respond to emergencies See telemetry face sheet for immediately available ER MD   Medication changes reported     No   Fall or balance concerns reported    No   Warm-up and Cool-down Performed on first and last piece of equipment   Resistance Training Performed No   VAD Patient? No   Pain Assessment   Currently in Pain? No/denies         Goals Met:  Proper associated with RPD/PD & O2 Sat Exercise tolerated well  Goals Unmet:  Not Applicable  Comments:     Dr. Emily Filbert is Medical Director for [place] and [facility].

## 2015-10-29 ENCOUNTER — Encounter: Payer: Self-pay | Admitting: *Deleted

## 2015-10-29 ENCOUNTER — Telehealth: Payer: Self-pay | Admitting: *Deleted

## 2015-10-29 NOTE — Telephone Encounter (Signed)
Jonathon HuaDavid called and said he could attend yesterday due to being busy at work but wishes he could attend Cardiac Rehab.

## 2015-10-31 ENCOUNTER — Encounter: Payer: No Typology Code available for payment source | Admitting: *Deleted

## 2015-10-31 DIAGNOSIS — Z951 Presence of aortocoronary bypass graft: Secondary | ICD-10-CM | POA: Diagnosis not present

## 2015-10-31 NOTE — Progress Notes (Signed)
Daily Session Note  Patient Details  Name: [name] MRN: [id number] Date of Birth: September 23, 1955 Referring Provider:    Encounter Date: 10/31/2015  Check In:     Session Check In - 10/31/15 1655    Check-In   Location [facility]   Staff Present Gerlene Burdock, RN, Jana Half, RN, Alex Gardener, DPT, CEEA   Supervising physician immediately available to respond to emergencies See telemetry face sheet for immediately available ER MD   Medication changes reported     No   Fall or balance concerns reported    No   Warm-up and Cool-down Performed on first and last piece of equipment   Resistance Training Performed Yes   VAD Patient? No   Pain Assessment   Currently in Pain? No/denies         Goals Met:  Proper associated with RPD/PD & O2 Sat Exercise tolerated well  Goals Unmet:  Not Applicable  Comments:     Dr. Emily Filbert is Medical Director for [place] and [facility].

## 2015-10-31 NOTE — Addendum Note (Signed)
Addended by: Virgina OrganENTERKIN, Amylia Collazos on: 10/31/2015 04:59 PM   Modules accepted: Medications

## 2015-11-03 ENCOUNTER — Encounter: Payer: Self-pay | Admitting: *Deleted

## 2015-11-03 DIAGNOSIS — Z951 Presence of aortocoronary bypass graft: Secondary | ICD-10-CM

## 2015-11-03 NOTE — Progress Notes (Signed)
Cardiac Individual Treatment Plan  Patient Details  Name: Jonathon Newman MRN: [id number] Date of Birth: 05-12-1956 Referring Provider:    Initial Encounter Date:       Cardiac Rehab from 07/31/2015 in [facility] Cardiac and Pulmonary Rehab   Date  07/31/15      Visit Diagnosis: S/P CABG x 2  Patient's Home Medications on Admission:  Current outpatient prescriptions:  .  aspirin 81 MG tablet, Take 81 mg by mouth daily., Disp: , Rfl:  .  atorvastatin (LIPITOR) 80 MG tablet, Take 80 mg by mouth daily., Disp: , Rfl:  .  carvedilol (COREG) 6.25 MG tablet, Take 6.25 mg by mouth 2 (two) times daily with a meal., Disp: , Rfl:  .  cyclobenzaprine (FLEXERIL) 10 MG tablet, Take 10 mg by mouth 3 (three) times daily as needed for muscle spasms., Disp: , Rfl:  .  docusate sodium (COLACE) 100 MG capsule, Take 100 mg by mouth 2 (two) times daily., Disp: , Rfl:  .  furosemide (LASIX) 20 MG tablet, Take 20 mg by mouth 3 (three) times daily., Disp: , Rfl:  .  gabapentin (NEURONTIN) 300 MG capsule, Take 300 mg by mouth 3 (three) times daily., Disp: , Rfl:  .  HYDROcodone-acetaminophen (NORCO) 10-325 MG tablet, Take 1 tablet by mouth every 6 (six) hours as needed., Disp: , Rfl:  .  hydrOXYzine (ATARAX/VISTARIL) 25 MG tablet, Take 25 mg by mouth 3 (three) times daily as needed., Disp: , Rfl:  .  losartan (COZAAR) 50 MG tablet, Take 50 mg by mouth daily., Disp: , Rfl:  .  pantoprazole (PROTONIX) 20 MG tablet, Take 20 mg by mouth daily., Disp: , Rfl:  .  predniSONE (DELTASONE) 20 MG tablet, Take 2 tablets (40 mg total) by mouth daily., Disp: 8 tablet, Rfl: 0  Past Medical History: Past Medical History  Diagnosis Date  . Hypertension   . Hypercholesteremia   . S/P coronary artery bypass graft x 2     Tobacco Use: History  Smoking status  . Former Smoker  Smokeless tobacco  . Not on file    Labs: Recent Review Flowsheet Data    There is no flowsheet data to display.       Exercise Target Goals:     Exercise Program Goal: Individual exercise prescription set with THRR, safety & activity barriers. Participant demonstrates ability to understand and report RPE using BORG scale, to self-measure pulse accurately, and to acknowledge the importance of the exercise prescription.  Exercise Prescription Goal: Starting with aerobic activity 30 plus minutes a day, 3 days per week for initial exercise prescription. Provide home exercise prescription and guidelines that participant acknowledges understanding prior to discharge.  Activity Barriers & Risk Stratification:     Activity Barriers & Cardiac Risk Stratification - 07/31/15 1756    Activity Barriers & Cardiac Risk Stratification   Activity Barriers Arthritis;Back Problems;Joint Problems;Neck/Spine Problems  Jonathon Newman has herniated discs at L3-5; s/p neck surgery for herniated disc in neck; right anterior lateral thigh numbness since CABG; Scheduled for MRI of back in next couple weeks to evaluate if right thigh pain/numbness coming from disc problems    Cardiac Risk Stratification High      6 Minute Walk:     6 Minute Walk      07/31/15 1440       6 Minute Walk   Phase Initial     Distance 1650 feet     Walk Time 6 minutes     # of  Rest Breaks 0     MPH 3     RPE 12     Symptoms No     Resting HR 84 bpm     Resting BP 136/86 mmHg     Max Ex. HR 120 bpm     Max Ex. BP 164/90 mmHg        Initial Exercise Prescription:     Initial Exercise Prescription - 07/31/15 1500    Date of Initial Exercise RX and Referring Provider   Date 07/31/15   Treadmill   MPH 2.8   Grade 0   Minutes 10   Bike   Level 0.4   Watts 15   Minutes 15   Recumbant Bike   Level 4   RPM 40   Watts 35   Minutes 15   NuStep   Level 3   Watts 40   Minutes 15   Arm Ergometer   Level 1   Watts 8   Minutes 10   Arm/Foot Ergometer   Level 4   Watts 12   Minutes 10   Cybex   Level 3   RPM 50   Minutes 15   Recumbant Elliptical   Level 2    RPM 40   Watts 15   Minutes 15   Elliptical   Level 1   Speed 3   Minutes 1   REL-XR   Level 3   Watts 45   Minutes 15   T5 Nustep   Level 2   Watts 20   Minutes 15   Biostep-RELP   Level 3   Watts 20   Minutes 15   Prescription Details   Frequency (times per week) 3   Duration Progress to 30 minutes of continuous aerobic without signs/symptoms of physical distress   Intensity   THRR REST +  30   Ratings of Perceived Exertion 11-15   Progression   Progression Continue progressive overload as per policy without signs/symptoms or physical distress.   Resistance Training   Training Prescription Yes   Weight 3   Reps 10-15      Perform Capillary Blood Glucose checks as needed.  Exercise Prescription Changes:     Exercise Prescription Changes      08/05/15 1700 08/14/15 1600 08/21/15 1108 09/18/15 1600 09/30/15 1348   Exercise Review   Progression   Yes Yes Yes   Response to Exercise   Blood Pressure (Admit)   138/82 mmHg  142/78 mmHg   Blood Pressure (Exercise)   168/80 mmHg  174/80 mmHg   Blood Pressure (Exit)   150/88 mmHg  126/84 mmHg   Heart Rate (Admit)   77 bpm  92 bpm   Heart Rate (Exercise)   113 bpm  101 bpm   Heart Rate (Exit)   84 bpm  81 bpm   Rating of Perceived Exertion (Exercise)   12  11   Symptoms   None  None   Comments Patient attended first day of cardiac rehabilitation. Following exercise on treadmill, patient stated felt dizzy.  BP taken in sitting with reading of 128/78.  Given water with dizziness abating.  Resumed exercise on another piece of equipment.  No other adverse effects noted.  Reviewed individualized exercise prescription and made increases per departmental policy. Exercise increases were discussed with the patient and they were able to perform the new work loads without issue (no signs or symptoms).  Reviewed individualized exercise prescription and made increases per departmental policy.  Exercise increases were discussed with the  patient and they were able to perform the new work loads without issue (no signs or symptoms).     Duration   Progress to 45 minutes of aerobic exercise without signs/symptoms of physical distress Progress to 45 minutes of aerobic exercise without signs/symptoms of physical distress Progress to 50 minutes of aerobic without signs/symptoms of physical distress   Intensity   Rest + 30 Rest + 30 Rest + 30   Progression   Progression   Continue progressive overload as per policy without signs/symptoms or physical distress. Continue progressive overload as per policy without signs/symptoms or physical distress. Continue to progress workloads to maintain intensity without signs/symptoms of physical distress.   Resistance Training   Training Prescription  Yes Yes Yes Yes   Weight  '5 5 5 5   ' Reps  10-15 10-15 10-15 10-15   Interval Training   Interval Training     Yes   Equipment     REL-XR   Comments     L8-L10   Treadmill   MPH  2.8 2.8 2.3 3   Grade  0 0 0 3.5   Minutes  '15 15 15 15   ' REL-XR   Level  '5 6 6 10   ' Watts  45 90 90 120   Minutes  '20 20 20    ' Home Exercise Plan   Plans to continue exercise at     [facility] (comment)     10/10/15 1700 10/17/15 1700 10/29/15 1500       Exercise Review   Progression Yes Yes Yes     Response to Exercise   Blood Pressure (Admit)   128/84 mmHg     Blood Pressure (Exercise)   168/60 mmHg     Blood Pressure (Exit)   116/74 mmHg     Heart Rate (Admit)   79 bpm     Heart Rate (Exercise)   108 bpm     Heart Rate (Exit)   80 bpm     Rating of Perceived Exertion (Exercise)   13     Symptoms   None     Duration Progress to 50 minutes of aerobic without signs/symptoms of physical distress Progress to 50 minutes of aerobic without signs/symptoms of physical distress Progress to 50 minutes of aerobic without signs/symptoms of physical distress     Intensity Rest + 30 Rest + 30 THRR New  40-80% THRR 65-130 bpm     Progression   Progression  Continue to progress workloads to maintain intensity without signs/symptoms of physical distress. Continue to progress workloads to maintain intensity without signs/symptoms of physical distress. Continue to progress workloads to maintain intensity without signs/symptoms of physical distress.     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  He continues to voice discomfort which is localized to the  left breast area, stating his cardiologist relates it to residual healing from surgery.  Plan is to continue to progress with program and assist with transition to community exercise upon completion of Heart Track. Jonathon Newman continues to attend [facility] and is progressing without difficulty.         Discharge Exercise Prescription (Final Exercise Prescription Changes):     Exercise Prescription Changes - 10/29/15 1500    Exercise Review   Progression Yes   Response to Exercise   Blood Pressure (Admit) 128/84 mmHg   Blood Pressure (Exercise) 168/60 mmHg   Blood Pressure (Exit) 116/74 mmHg   Heart Rate (Admit) 79 bpm   Heart Rate (Exercise) 108 bpm   Heart Rate (Exit) 80 bpm   Rating of Perceived Exertion (Exercise) 13   Symptoms None   Duration Progress to 50 minutes of aerobic without signs/symptoms of physical distress   Intensity THRR New  40-80%  THRR 65-130 bpm   Progression   Progression Continue to progress workloads to maintain intensity without signs/symptoms of physical distress.   Resistance Training   Training Prescription Yes   Weight 5   Reps 10-15   Interval Training   Interval Training Yes   Equipment REL-XR   Comments L8-L10   Treadmill   MPH 3.1   Grade 3   Minutes 15   REL-XR   Level 10   Watts 120   Minutes 15   Home Exercise Plan   Plans to continue exercise at [employer] (comment)      Nutrition:  Target Goals: Understanding of nutrition guidelines, daily intake of sodium <1518m, cholesterol <2057m calories 30% from fat and 7% or less from saturated fats, daily to have 5 or more servings of fruits and vegetables.  Biometrics:     Pre Biometrics - 07/31/15 1439    Pre Biometrics   Height '5\' 7"'  (1.702 m)   Weight 205 lb (92.987 kg)   Waist Circumference 40.5 inches   Hip Circumference 40.5 inches   Waist to Hip Ratio 1 %   BMI (Calculated) 32.2       Nutrition Therapy Plan and Nutrition Goals:     Nutrition Therapy & Goals - 08/14/15 1611    Nutrition Therapy   Diet 1700kcal DASH diet    Drug/Food Interactions Statins/Certain Fruits   Protein (specify units) 7-8oz   Fiber 30 grams   Whole Grain Foods 3 servings   Saturated Fats 13 max. grams   Fruits and Vegetables 5 servings/day   Sodium 1500 grams   Personal Nutrition Goals   Personal Goal #1 Drink more water each day -- at least 64 oz fluids daily   Personal Goal #2 Work to include fruits and vegetables on a more consistent basis   Personal Goal #3 Choose lean meats -- tuKuwaitersions of sausage, bacon, etc.   Comments Patient states he eats foods according to his taste any any particular time; for example, he might eat bananas 2-3 at a time for several days or weeks and then not eat any for 1-2 months.    Intervention Plan   Intervention Nutrition handout(s) given to patient.;Prescribe, educate  and counsel regarding  individualized specific dietary modifications aiming towards targeted core components such as weight, hypertension, lipid management, diabetes, heart failure and other comorbidities.   Expected Outcomes Short Term Goal: A plan has been developed with personal nutrition goals set during dietitian appointment.      Nutrition Discharge: Rate Your Plate Scores:     Nutrition Assessments - 08/08/15 0849    Rate Your Plate Scores   Pre Score 58   Pre Score % 64 %      Nutrition Goals Re-Evaluation:   Psychosocial: Target Goals: Acknowledge presence or absence of depression, maximize coping skills, provide positive support system. Participant is able to verbalize types and ability to use techniques and skills needed for reducing stress and depression.  Initial Review & Psychosocial Screening:     Initial Psych Review & Screening - 07/31/15 1817    Initial Review   Current issues with Current Depression;Current Sleep Concerns;Current Stress Concerns   Comments Jonathon Newman informed nurse that he has been having anxiety issues (had bypass surgery in November 2016). Jonathon Newman described a couple of episodes where he became really anxious and on edge and he needed to be alone during those times; Jonathon Newman mother passed away 3 weeks ago;    Family Dynamics   Good Support System? No   Comments reports having a close friend.  Jonathon Newman mother passed away 3 weeks ago; has 2 grown children and 8 grandkids and will soon have a great grandchild.  Jonathon Newman states he has never been on any medication for anxiety or depression.  In addition, prior to his heart bypass, Jonathon Newman was exercising 3 days a week for 2 hours prior to having heart  bypass.  Jonathon Newman states he has gained weight since his bypass surgery.     Barriers   Psychosocial barriers to participate in program The patient should benefit from training in stress management and relaxation.   Screening Interventions   Interventions Program counselor consult;Encouraged to  exercise      Quality of Life Scores:     Quality of Life - 07/31/15 1826    Quality of Life Scores   Health/Function Pre 21.57 %   Socioeconomic Pre 18.57 %   Psych/Spiritual Pre 23.36 %   Family Pre 19.2 %   GLOBAL Pre 20.97 %      PHQ-9:     Recent Review Flowsheet Data    Depression screen [facility] 2/9 07/31/2015   Decreased Interest 2   Down, Depressed, Hopeless 2   PHQ - 2 Score 4   Altered sleeping 2   Tired, decreased energy 1   Change in appetite 0   Feeling bad or failure about yourself  1   Trouble concentrating 0   Moving slowly or fidgety/restless 0   Suicidal thoughts 0   PHQ-9 Score 8   Difficult doing work/chores Not difficult at all      Psychosocial Evaluation and Intervention:     Psychosocial Evaluation - 08/05/15 1613    Psychosocial Evaluation & Interventions   Interventions Encouraged to exercise with the program and follow exercise prescription;Stress management education;Relaxation education   Comments Counselor met with Jonathon Newman today for initial psychological evaluation.  He is a 60 year old who has a family history of heart disease and had open heart surgery on 04/18/15. 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  Jonathon Newman has sleep apnea as  well as cardiac issues and reports he has intermittent sleep often - although he uses his CPAP nightly.  He denies a history of depression, but has some current symptoms.  Counselor assessed for depression, with Jonathon Newman reporting his mother just passed away 3 weeks ago and accompanied by his current health and job stress, it is impacting him at this time.   He reported his current mood as an "8" on a scale of 1-10 with "10" being the best mood at this time.   Mr. Blahnik has goals to lose weight and get back in shape so he can return to the gym where he was working out typically 3x/week last summer and early  fall.  He is also encouraged to attend the stress management and depression psychoeducational components of this program and meet with the dietician to address his weight loss goals.     Continued Psychosocial Services Needed Yes  Jonathon Newman will benefit from the psychoeducational components of this program.  He also needs to meet with the dietician to address his weight loss goals.       Psychosocial Re-Evaluation:     Psychosocial Re-Evaluation      08/26/15 1700 08/28/15 1738 10/07/15 1712       Psychosocial Re-Evaluation   Comments Jonathon Newman taken to [place] for c/o shortness of breath since last night and couldn't lie flat. Pulse ox room air in Cardiac 97% Jonathon Newman 168/98. No chest pain.  I left vm to follow up with patient today. Was discharged to home from [facility] yesterday.  Follow up with [name] today to assess how he is doing since the passing of his mother in January.  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 Counselor encouraged [name] to see a counselor to talk about his grief and loss and this conflict, and he stated [facility] may [facility].  Jonathon Newman is also encouraged to engage in stress management activities to help with coping during this time.       Continued Psychosocial Services Needed   No        Vocational Rehabilitation: Provide vocational rehab assistance to qualifying candidates.   Vocational Rehab Evaluation & Intervention:     Vocational Rehab - 07/31/15 1803    Initial Vocational Rehab Evaluation & Intervention   Assessment shows need for Vocational Rehabilitation No      Education: Education Goals: Education classes will be provided on a weekly basis, covering required topics. Participant will state understanding/return demonstration of topics presented.  Learning Barriers/Preferences:     Learning  Barriers/Preferences - 07/31/15 1800    Learning Barriers/Preferences   Learning Barriers None   Learning Preferences Written Material;Video;Group Instruction      Education Topics: General Nutrition Guidelines/Fats and Fiber: -Group instruction provided by verbal, written material, models and posters to present the general guidelines for heart healthy nutrition. Gives an explanation and review of dietary fats and fiber.          Cardiac Rehab from 09/18/2015 in [facility] Cardiac and Pulmonary Rehab   Date  09/16/15   Educator  PI   Instruction Review Code  2- meets goals/outcomes      Controlling Sodium/Reading Food Labels: -Group verbal and written material supporting the discussion of sodium use in heart healthy nutrition. Review and explanation with models, verbal and written materials for utilization  of the food label.   Exercise Physiology & Risk Factors: - Group verbal and written instruction with models to review the exercise physiology of the cardiovascular system and associated critical values. Details cardiovascular disease risk factors and the goals associated with each risk factor.      Cardiac Rehab from 09/18/2015 in [facility] Cardiac and Pulmonary Rehab   Date  08/12/15   Educator  sw   Instruction Review Code  2- meets goals/outcomes      Aerobic Exercise & Resistance Training: - Gives group verbal and written discussion on the health impact of inactivity. On the components of aerobic and resistive training programs and the benefits of this training and how to safely progress through these programs.      Cardiac Rehab from 09/18/2015 in [facility] Cardiac and Pulmonary Rehab   Date  08/14/15   Educator  B. Sickles   Instruction Review Code  2- meets goals/outcomes      Flexibility, Balance, General Exercise Guidelines: - Provides group verbal and written instruction on the benefits of flexibility and balance training programs. Provides general exercise guidelines with specific  guidelines to those with heart or lung disease. Demonstration and skill practice provided.   Stress Management: - Provides group verbal and written instruction about the health risks of elevated stress, cause of high stress, and healthy ways to reduce stress.      Cardiac Rehab from 09/18/2015 in [facility] Cardiac and Pulmonary Rehab   Date  08/21/15   Educator  Kathreen Cornfield, [facility]   Instruction Review Code  2- meets goals/outcomes      Depression: - Provides group verbal and written instruction on the correlation between heart/lung disease and depressed mood, treatment options, and the stigmas associated with seeking treatment.      Cardiac Rehab from 09/18/2015 in [facility] Cardiac and Pulmonary Rehab   Date  09/18/15   Educator  Kathreen Cornfield, [facility]   Instruction Review Code  2- meets goals/outcomes      Anatomy & Physiology of the Heart: - Group verbal and written instruction and models provide basic cardiac anatomy and physiology, with the coronary electrical and arterial systems. Review of: AMI, Angina, Valve disease, Heart Failure, Cardiac Arrhythmia, Pacemakers, and the ICD.   Cardiac Procedures: - Group verbal and written instruction and models to describe the testing methods done to diagnose heart disease. Reviews the outcomes of the test results. Describes the treatment choices: Medical Management, Angioplasty, or Coronary Bypass Surgery.      Cardiac Rehab from 09/18/2015 in [facility] Cardiac and Pulmonary Rehab   Date  09/02/15   Educator  CE   Instruction Review Code  2- meets goals/outcomes      Cardiac Medications: - Group verbal and written instruction to review commonly prescribed medications for heart disease. Reviews the medication, class of the drug, and side effects. Includes the steps to properly store meds and maintain the prescription regimen.   Go Sex-Intimacy & Heart Disease, Get SMART - Goal Setting: - Group verbal and written instruction through game format to discuss heart  disease and the return to sexual intimacy. Provides group verbal and written material to discuss and apply goal setting through the application of the S.M.A.R.T. Method.      Cardiac Rehab from 09/18/2015 in [facility] Cardiac and Pulmonary Rehab   Date  09/02/15   Educator  CE   Instruction Review Code  2- meets goals/outcomes      Other Matters of the Heart: - Provides group verbal, written  materials and models to describe Heart Failure, Angina, Valve Disease, and Diabetes in the realm of heart disease. Includes description of the disease process and treatment options available to the cardiac patient.   Exercise & Equipment Safety: - Individual verbal instruction and demonstration of equipment use and safety with use of the equipment.      Cardiac Rehab from 09/18/2015 in [facility] Cardiac and Pulmonary Rehab   Date  07/31/15   Educator  D. Joya Gaskins, RN   Instruction Review Code  1- partially meets, needs review/practice      Infection Prevention: - Provides verbal and written material to individual with discussion of infection control including proper hand washing and proper equipment cleaning during exercise session.      Cardiac Rehab from 09/18/2015 in [facility] Cardiac and Pulmonary Rehab   Date  07/31/15   Educator  D. Joya Gaskins, RN   Instruction Review Code  2- meets goals/outcomes      Falls Prevention: - Provides verbal and written material to individual with discussion of falls prevention and safety.      Cardiac Rehab from 09/18/2015 in [facility] Cardiac and Pulmonary Rehab   Date  07/31/15   Educator  D. Joya Gaskins, RN   Instruction Review Code  2- meets goals/outcomes      Diabetes: - Individual verbal and written instruction to review signs/symptoms of diabetes, desired ranges of glucose level fasting, after meals and with exercise. Advice that pre and post exercise glucose checks will be done for 3 sessions at entry of program.    Knowledge Questionnaire Score:     Knowledge Questionnaire  Score - 07/31/15 1800    Knowledge Questionnaire Score   Pre Score 24/28      Core Components/Risk Factors/Patient Goals at Admission:     Personal Goals and Risk Factors at Admission - 07/31/15 1815    Core Components/Risk Factors/Patient Goals on Admission    Weight Management Yes   Intervention (read-only) Learn and follow the exercise and diet guidelines while in the program. Utilize the nutrition and education classes to help gain knowledge of the diet and exercise expectations in the program   Admit Weight 205 lb (92.987 kg)   Goal Weight: Short Term 165 lb (74.844 kg)   Sedentary Yes   Intervention (read-only) While in program, learn and follow the exercise prescription taught. Start at a low level workload and increase workload after able to maintain previous level for 30 minutes. Increase time before increasing intensity.   Diabetes No   Hypertension Yes   Goal Participant will see blood pressure controlled within the values of 140/3m/Hg or within value directed by their physician.   Intervention (read-only) Provide nutrition & aerobic exercise along with prescribed medications to achieve BP 140/90 or less.   Lipids Yes   Goal Cholesterol controlled with medications as prescribed, with individualized exercise RX and with personalized nutrition plan. Value goals: LDL < 740m HDL > 40106mParticipant states understanding of desired cholesterol values and following prescriptions.   Intervention (read-only) Provide nutrition & aerobic exercise along with prescribed medications to achieve LDL <46m42mDL >40mg18mStress Yes   Goal To meet with psychosocial counselor for stress and relaxation information and guidance. To state understanding of performing relaxation techniques and or identifying personal stressors.   Intervention (read-only) Provide education on types of stress, identifiying stressors, and ways to cope with stress. Provide demonstration and active practice of relaxation  techniques.   Take Less Medication Yes   Intervention Learn  your risk factors and begin the lifestyle modifications for risk factor control during your time in the program.      Core Components/Risk Factors/Patient Goals Review:      Goals and Risk Factor Review      08/26/15 1659 08/28/15 1736 09/18/15 1737 11/03/15 1116     Core Components/Risk Factors/Patient Goals Review   Personal Goals Review Sedentary;Hypertension Sedentary;Hypertension Hypertension Hypertension    Review Jonathon Newman taken to [place] for c/o shortness of breath since last night and couldn't lie flat. Pulse ox room air in Cardiac 97% Jonathon Newman 168/98. No chest pain.  Per Pulcifer Dept note 08/27/2015:Pertinent labs & imaging results that were available during my care of the patient were reviewed by me and considered in my medical decision making (see chart for details). Jonathon Newman's BP upon seeing the cardiologist at the [facility] on Friday, March 31st was 160/110.  Coreg dose doubled to 12.50 orally twice a day.  Today's BP upon check in was 132/74.  Jonathon Newman states he could tell his BP has been elevated at times as he feels like his head was going to explode.  He would have it checked somewhere and  the diastolic would be 517.  He stated he has not had these episodes since starting Coreg 12.50 mg twice a day.  Exit BP today was 136/74.   Jonathon Newman BP numbers are improving. Entry [phone]  Post exercise 116/74-132/82.    Expected Outcomes Controlled blood pressure and no SOB Decreased SOB and no chest pain. Jonathon Newman will see BP controlled within the values of 140/90 mmg Hg or within detected value directed by the physician.   Jonathon Newman will see BP controlled within the values of 140/90 mmg Hg or within detected value directed by the physician.         Core Components/Risk Factors/Patient Goals at Discharge (Final Review):      Goals and Risk Factor Review - 11/03/15 1116    Core Components/Risk Factors/Patient Goals Review   Personal Goals  Review Hypertension   Review Jonathon Newman's BP numbers are improving. Entry [phone]  Post exercise 116/74-132/82.   Expected Outcomes Jonathon Newman will see BP controlled within the values of 140/90 mmg Hg or within detected value directed by the physician.        ITP Comments:     ITP Comments      08/06/15 0731 08/19/15 1356 08/26/15 1658 08/28/15 1737 09/05/15 1535   ITP Comments 30 day review.  Continue with ITP  new to program, to this date he has completed orientation and attended one session.  Rad is experiencing mild to severe pain related to the surgery. He will approach the exercises with caution and will not use weignts until he sees some relief with the muscle and chest wall pain.  Ege left a vm today that he will not be able to attend Cardiac Rehab this evening but hopes to return on Wednesday.  Emani taken to [place] for c/o shortness of breath since last night and couldn't lie flat. Pulse ox room air in Cardiac 97% Jonathon Newman 168/98. No chest pain.  Chidubem was seen in [place] yesterday for SOB all day not able to lie flat. Not acute SOB in Cardiac Rehab but did not have him exercise on 08/27/2015 but taken to Emerg Dept to be evaluated. Discharged to home-see EPIC note.  30 Day Review. Continue with the ITP.     10/06/15 1035 10/29/15 1041 11/03/15 1115  ITP Comments 30 Day review. Continue with ITP Athan called and said he could attend yesterday due to being busy at work but wishes he could attend Cardiac Rehab.  30 day review. Continue with ITP        Comments:

## 2015-11-04 ENCOUNTER — Encounter: Payer: No Typology Code available for payment source | Admitting: *Deleted

## 2015-11-04 DIAGNOSIS — Z951 Presence of aortocoronary bypass graft: Secondary | ICD-10-CM

## 2015-11-04 NOTE — Progress Notes (Signed)
Daily Session Note  Patient Details  Name: Roddy Bellamy MRN: [id number] Date of Birth: 18-Jun-1955 Referring Provider:    Encounter Date: 11/04/2015  Check In:     Session Check In - 11/04/15 1722    Check-In   Location [facility]Card[facility] Pul[facility]   Staff Present Jeanell Sparrow, DPT, Burlene Arnt, BA, ACSM CEP, Exercise Physiologist;Diane Joya Gaskins, RN, Moises Blood, BS, ACSM CEP, Exercise Physiologist   Supervising physician immediately available to respond to emergencies See telemetry face sheet for immediately available ER MD   Medication changes reported     No   Fall or balance concerns reported    No   Warm-up and Cool-down Performed on first and last piece of equipment   Resistance Training Performed Yes   VAD Patient? No         Goals Met:  Independence with exercise equipment Exercise tolerated well No report of cardiac concerns or symptoms  Goals Unmet:  Not Applicable  Comments: Patient completed exercise prescription and all exercise goals during rehab session. The exercise was tolerated well and the patient is progressing in the program.    Dr. Emily Filbert is Medical Director for [place] and [facility].

## 2015-11-07 VITALS — Ht 67.0 in | Wt 214.5 lb

## 2015-11-07 DIAGNOSIS — Z951 Presence of aortocoronary bypass graft: Secondary | ICD-10-CM | POA: Diagnosis not present

## 2015-11-07 NOTE — Progress Notes (Signed)
Daily Session Note  Patient Details  Name: Jonathon Newman MRN: [id number] Date of Birth: [birth year] Referring Provider:    Encounter Date: 11/07/2015  Check In:     Session Check In - 11/07/15 1640    Check-In   Location [facility]   Staff Present Gerlene Burdock, RN, BSN;Atlee Kluth, DPT, Burlene Arnt, BA, ACSM CEP, Exercise Physiologist;Diane Joya Gaskins, RN, BSN   Supervising physician immediately available to respond to emergencies See telemetry face sheet for immediately available ER MD   Medication changes reported     No   Fall or balance concerns reported    No   Warm-up and Cool-down Performed on first and last piece of equipment   Resistance Training Performed Yes   VAD Patient? No   Pain Assessment   Currently in Pain? No/denies           Exercise Prescription Changes - 11/07/15 1600    Home Exercise Plan   Plans to continue exercise at [facility] (comment)      Goals Met:  Independence with exercise equipment Exercise tolerated well  Goals Unmet:  Not Applicable  Comments: Six minute walk performed today.   Dr. Emily Filbert is Medical Director for [place] and [facility].

## 2015-11-07 NOTE — Patient Instructions (Signed)
Discharge Instructions  Patient Details  Name: Jonathon Newman MRN: [id number] Date of Birth: 07-16-1955 Referring Provider:  [facility]   Number of Visits:   Reason for Discharge:  Patient reached a stable level of exercise. Patient independent in their exercise.  Smoking History:  History  Smoking status  . Former Smoker  Smokeless tobacco  . Not on file    Diagnosis:  S/P CABG x 2  Initial Exercise Prescription:     Initial Exercise Prescription - 07/31/15 1500    Date of Initial Exercise RX and Referring Provider   Date 07/31/15   Treadmill   MPH 2.8   Grade 0   Minutes 10   Bike   Level 0.4   Watts 15   Minutes 15   Recumbant Bike   Level 4   RPM 40   Watts 35   Minutes 15   NuStep   Level 3   Watts 40   Minutes 15   Arm Ergometer   Level 1   Watts 8   Minutes 10   Arm/Foot Ergometer   Level 4   Watts 12   Minutes 10   Cybex   Level 3   RPM 50   Minutes 15   Recumbant Elliptical   Level 2   RPM 40   Watts 15   Minutes 15   Elliptical   Level 1   Speed 3   Minutes 1   REL-XR   Level 3   Watts 45   Minutes 15   T5 Nustep   Level 2   Watts 20   Minutes 15   Biostep-RELP   Level 3   Watts 20   Minutes 15   Prescription Details   Frequency (times per week) 3   Duration Progress to 30 minutes of continuous aerobic without signs/symptoms of physical distress   Intensity   THRR REST +  30   Ratings of Perceived Exertion 11-15   Progression   Progression Continue progressive overload as per policy without signs/symptoms or physical distress.   Resistance Training   Training Prescription Yes   Weight 3   Reps 10-15      Discharge Exercise Prescription (Final Exercise Prescription Changes):     Exercise Prescription Changes - 11/07/15 1600    Home Exercise Plan   Plans to continue exercise at [facility] (comment)      Functional Capacity:     6 Minute Walk      07/31/15 1440 11/07/15 1641  11/07/15 1654   6 Minute Walk   Phase Initial Discharge Discharge   Distance 1650 feet 2000 feet    Distance % Change   21 %   Walk Time 6 minutes 6 minutes    # of Rest Breaks 0     MPH 3     RPE 12 14    Symptoms No     Resting HR 84 bpm 78 bpm    Resting BP 136/86 mmHg 124/62 mmHg    Max Ex. HR 120 bpm 116 bpm    Max Ex. BP 164/90 mmHg 170/80 mmHg    Interval Oxygen   Interval Oxygen?  Yes    Baseline Oxygen Saturation %  98 %    6 Minute Oxygen Saturation %  97 %       Quality of Life:     Quality of Life - 11/07/15 1735    Quality of Life Scores   Health/Function Post  20.93 %   Socioeconomic Post 23.06 %   Psych/Spiritual Post 16.86 %   Family Post 21.2 %   GLOBAL Post 20.64 %      Personal Goals: Goals established at orientation with interventions provided to work toward goal.     Personal Goals and Risk Factors at Admission - 07/31/15 1815    Core Components/Risk Factors/Patient Goals on Admission    Weight Management Yes   Intervention (read-only) Learn and follow the exercise and diet guidelines while in the program. Utilize the nutrition and education classes to help gain knowledge of the diet and exercise expectations in the program   Admit Weight 205 lb (92.987 kg)   Goal Weight: Short Term 165 lb (74.844 kg)   Sedentary Yes   Intervention (read-only) While in program, learn and follow the exercise prescription taught. Start at a low level workload and increase workload after able to maintain previous level for 30 minutes. Increase time before increasing intensity.   Diabetes No   Hypertension Yes   Goal Participant will see blood pressure controlled within the values of 140/77mm/Hg or within value directed by their physician.   Intervention (read-only) Provide nutrition & aerobic exercise along with prescribed medications to achieve BP 140/90 or less.   Lipids Yes   Goal Cholesterol controlled with medications as prescribed, with individualized exercise  RX and with personalized nutrition plan. Value goals: LDL < 70mg , HDL > 40mg . Participant states understanding of desired cholesterol values and following prescriptions.   Intervention (read-only) Provide nutrition & aerobic exercise along with prescribed medications to achieve LDL 70mg , HDL >40mg .   Stress Yes   Goal To meet with psychosocial counselor for stress and relaxation information and guidance. To state understanding of performing relaxation techniques and or identifying personal stressors.   Intervention (read-only) Provide education on types of stress, identifiying stressors, and ways to cope with stress. Provide demonstration and active practice of relaxation techniques.   Take Less Medication Yes   Intervention Learn your risk factors and begin the lifestyle modifications for risk factor control during your time in the program.       Personal Goals Discharge:     Goals and Risk Factor Review - 11/03/15 1116    Core Components/Risk Factors/Patient Goals Review   Personal Goals Review Hypertension   Review Jonathon Newman's BP numbers are improving. Entry [phone]  Post exercise 116/74-132/82.   Expected Outcomes Jonathon Newman will see BP controlled within the values of 140/90 mmg Hg or within detected value directed by the physician.        Nutrition & Weight - Outcomes:     Pre Biometrics - 07/31/15 1439    Pre Biometrics   Height 5\' 7"  (1.702 m)   Weight 205 lb (92.987 kg)   Waist Circumference 40.5 inches   Hip Circumference 40.5 inches   Waist to Hip Ratio 1 %   BMI (Calculated) 32.2         Post Biometrics - 11/07/15 1643     Post  Biometrics   Height 5\' 7"  (1.702 m)   Weight 214 lb 8 oz (97.297 kg)   Waist Circumference 43 inches   Hip Circumference 43 inches   Waist to Hip Ratio 1 %   BMI (Calculated) 33.7      Nutrition:     Nutrition Therapy & Goals - 08/14/15 1611    Nutrition Therapy   Diet 1700kcal DASH diet    Drug/Food Interactions Statins/Certain  Fruits   Protein (specify units)  7-8oz   Fiber 30 grams   Whole Grain Foods 3 servings   Saturated Fats 13 max. grams   Fruits and Vegetables 5 servings/day   Sodium 1500 grams   Personal Nutrition Goals   Personal Goal #1 Drink more water each day -- at least 64 oz fluids daily   Personal Goal #2 Work to include fruits and vegetables on a more consistent basis   Personal Goal #3 Choose lean meats -- [place] versions of sausage, bacon, etc.   Comments Patient states he eats foods according to his taste any any particular time; for example, he might eat bananas 2-3 at a time for several days or weeks and then not eat any for 1-2 months.    Intervention Plan   Intervention Nutrition handout(s) given to patient.;Prescribe, educate and counsel regarding individualized specific dietary modifications aiming towards targeted core components such as weight, hypertension, lipid management, diabetes, heart failure and other comorbidities.   Expected Outcomes Short Term Goal: A plan has been developed with personal nutrition goals set during dietitian appointment.      Nutrition Discharge:     Nutrition Assessments - 11/07/15 1744    Rate Your Plate Scores   Post Score 65   Post Score % 72 %      Education Questionnaire Score:     Knowledge Questionnaire Score - 11/07/15 1745    Knowledge Questionnaire Score   Post Score 24      Goals reviewed with patient; copy given to patient.

## 2015-11-07 NOTE — Progress Notes (Signed)
Cardiac Individual Treatment Plan  Patient Details  Name: Edrick Whitehorn MRN: [id number] Date of Birth: [birth year] Referring Provider:    Initial Encounter Date:       Cardiac Rehab from 07/31/2015 in [facility] Cardiac and Pulmonary Rehab   Date  07/31/15      Visit Diagnosis: S/P CABG x 2  Patient's Home Medications on Admission:  Current outpatient prescriptions:  .  aspirin 81 MG tablet, Take 81 mg by mouth daily., Disp: , Rfl:  .  atorvastatin (LIPITOR) 80 MG tablet, Take 80 mg by mouth daily., Disp: , Rfl:  .  carvedilol (COREG) 6.25 MG tablet, Take 6.25 mg by mouth 2 (two) times daily with a meal., Disp: , Rfl:  .  cyclobenzaprine (FLEXERIL) 10 MG tablet, Take 10 mg by mouth 3 (three) times daily as needed for muscle spasms., Disp: , Rfl:  .  docusate sodium (COLACE) 100 MG capsule, Take 100 mg by mouth 2 (two) times daily., Disp: , Rfl:  .  furosemide (LASIX) 20 MG tablet, Take 20 mg by mouth 3 (three) times daily., Disp: , Rfl:  .  gabapentin (NEURONTIN) 300 MG capsule, Take 300 mg by mouth 3 (three) times daily., Disp: , Rfl:  .  HYDROcodone-acetaminophen (NORCO) 10-325 MG tablet, Take 1 tablet by mouth every 6 (six) hours as needed., Disp: , Rfl:  .  hydrOXYzine (ATARAX/VISTARIL) 25 MG tablet, Take 25 mg by mouth 3 (three) times daily as needed., Disp: , Rfl:  .  losartan (COZAAR) 50 MG tablet, Take 50 mg by mouth daily., Disp: , Rfl:  .  pantoprazole (PROTONIX) 20 MG tablet, Take 20 mg by mouth daily., Disp: , Rfl:  .  predniSONE (DELTASONE) 20 MG tablet, Take 2 tablets (40 mg total) by mouth daily., Disp: 8 tablet, Rfl: 0  Past Medical History: Past Medical History  Diagnosis Date  . Hypertension   . Hypercholesteremia   . S/P coronary artery bypass graft x 2     Tobacco Use: History  Smoking status  . Former Smoker  Smokeless tobacco  . Not on file    Labs: Recent Review Flowsheet Data    There is no flowsheet data to display.       Exercise Target Goals:     Exercise Program Goal: Individual exercise prescription set with THRR, safety & activity barriers. Participant demonstrates ability to understand and report RPE using BORG scale, to self-measure pulse accurately, and to acknowledge the importance of the exercise prescription.  Exercise Prescription Goal: Starting with aerobic activity 30 plus minutes a day, 3 days per week for initial exercise prescription. Provide home exercise prescription and guidelines that participant acknowledges understanding prior to discharge.  Activity Barriers & Risk Stratification:     Activity Barriers & Cardiac Risk Stratification - 07/31/15 1756    Activity Barriers & Cardiac Risk Stratification   Activity Barriers Arthritis;Back Problems;Joint Problems;Neck/Spine Problems  [name] has herniated discs at L3-5; s/p neck surgery for herniated disc in neck; right anterior lateral thigh numbness since CABG; Scheduled for MRI of back in next couple weeks to evaluate if right thigh pain/numbness coming from disc problems    Cardiac Risk Stratification High      6 Minute Walk:     6 Minute Walk      07/31/15 1440 11/07/15 1641 11/07/15 1654   6 Minute Walk   Phase Initial Discharge Discharge   Distance 1650 feet 2000 feet    Distance % Change   21 %  Walk Time 6 minutes 6 minutes    # of Rest Breaks 0     MPH 3     RPE 12 14    Symptoms No     Resting HR 84 bpm 78 bpm    Resting BP 136/86 mmHg 124/62 mmHg    Max Ex. HR 120 bpm 116 bpm    Max Ex. BP 164/90 mmHg 170/80 mmHg    Interval Oxygen   Interval Oxygen?  Yes    Baseline Oxygen Saturation %  98 %    6 Minute Oxygen Saturation %  97 %       Initial Exercise Prescription:     Initial Exercise Prescription - 07/31/15 1500    Date of Initial Exercise RX and Referring Provider   Date 07/31/15   Treadmill   MPH 2.8   Grade 0   Minutes 10   Bike   Level 0.4   Watts 15   Minutes 15   Recumbant Bike   Level 4   RPM 40   Watts 35    Minutes 15   NuStep   Level 3   Watts 40   Minutes 15   Arm Ergometer   Level 1   Watts 8   Minutes 10   Arm/Foot Ergometer   Level 4   Watts 12   Minutes 10   Cybex   Level 3   RPM 50   Minutes 15   Recumbant Elliptical   Level 2   RPM 40   Watts 15   Minutes 15   Elliptical   Level 1   Speed 3   Minutes 1   REL-XR   Level 3   Watts 45   Minutes 15   T5 Nustep   Level 2   Watts 20   Minutes 15   Biostep-RELP   Level 3   Watts 20   Minutes 15   Prescription Details   Frequency (times per week) 3   Duration Progress to 30 minutes of continuous aerobic without signs/symptoms of physical distress   Intensity   THRR REST +  30   Ratings of Perceived Exertion 11-15   Progression   Progression Continue progressive overload as per policy without signs/symptoms or physical distress.   Resistance Training   Training Prescription Yes   Weight 3   Reps 10-15      Perform Capillary Blood Glucose checks as needed.  Exercise Prescription Changes:     Exercise Prescription Changes      08/05/15 1700 08/14/15 1600 08/21/15 1108 09/18/15 1600 09/30/15 1348   Exercise Review   Progression   Yes Yes Yes   Response to Exercise   Blood Pressure (Admit)   138/82 mmHg  142/78 mmHg   Blood Pressure (Exercise)   168/80 mmHg  174/80 mmHg   Blood Pressure (Exit)   150/88 mmHg  126/84 mmHg   Heart Rate (Admit)   77 bpm  92 bpm   Heart Rate (Exercise)   113 bpm  101 bpm   Heart Rate (Exit)   84 bpm  81 bpm   Rating of Perceived Exertion (Exercise)   12  11   Symptoms   None  None   Comments Patient attended first day of cardiac rehabilitation. Following exercise on treadmill, patient stated felt dizzy.  BP taken in sitting with reading of 128/78.  Given water with dizziness abating.  Resumed exercise on another piece of equipment.  No other adverse  effects noted.  Reviewed individualized exercise prescription and made increases per departmental policy. Exercise increases  were discussed with the patient and they were able to perform the new work loads without issue (no signs or symptoms).  Reviewed individualized exercise prescription and made increases per departmental policy. Exercise increases were discussed with the patient and they were able to perform the new work loads without issue (no signs or symptoms).     Duration   Progress to 45 minutes of aerobic exercise without signs/symptoms of physical distress Progress to 45 minutes of aerobic exercise without signs/symptoms of physical distress Progress to 50 minutes of aerobic without signs/symptoms of physical distress   Intensity   Rest + 30 Rest + 30 Rest + 30   Progression   Progression   Continue progressive overload as per policy without signs/symptoms or physical distress. Continue progressive overload as per policy without signs/symptoms or physical distress. Continue to progress workloads to maintain intensity without signs/symptoms of physical distress.   Resistance Training   Training Prescription  Yes Yes Yes Yes   Weight  '5 5 5 5   ' Reps  10-15 10-15 10-15 10-15   Interval Training   Interval Training     Yes   Equipment     REL-XR   Comments     L8-L10   Treadmill   MPH  2.8 2.8 2.3 3   Grade  0 0 0 3.5   Minutes  '15 15 15 15   ' REL-XR   Level  '5 6 6 10   ' Watts  45 90 90 120   Minutes  '20 20 20    ' Home Exercise Plan   Plans to continue exercise at     [facility] (comment)     10/10/15 1700 10/17/15 1700 10/29/15 1500 11/07/15 1600     Exercise Review   Progression Yes Yes Yes     Response to Exercise   Blood Pressure (Admit)   128/84 mmHg     Blood Pressure (Exercise)   168/60 mmHg     Blood Pressure (Exit)   116/74 mmHg     Heart Rate (Admit)   79 bpm     Heart Rate (Exercise)   108 bpm     Heart Rate (Exit)   80 bpm     Rating of Perceived Exertion (Exercise)   13     Symptoms   None     Duration Progress to 50 minutes of aerobic without signs/symptoms of physical  distress Progress to 50 minutes of aerobic without signs/symptoms of physical distress Progress to 50 minutes of aerobic without signs/symptoms of physical distress     Intensity Rest + 30 Rest + 30 THRR New  40-80% THRR 65-130 bpm     Progression   Progression Continue to progress workloads to maintain intensity without signs/symptoms of physical distress. Continue to progress workloads to maintain intensity without signs/symptoms of physical distress. Continue to progress workloads to maintain intensity without signs/symptoms of physical distress.     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  He continues to voice discomfort which is localized to the  left breast area, stating his cardiologist relates it to residual healing from surgery.  Plan is to continue to progress with program and assist with transition to community exercise upon completion of Heart Track. Mclane continues to attend Heart Track and is progressing without difficulty.         Discharge Exercise Prescription (Final Exercise Prescription Changes):     Exercise Prescription Changes - 11/07/15 1600    Home Exercise Plan   Plans to continue exercise at [facility] (comment)      Nutrition:  Target Goals: Understanding of nutrition guidelines,  daily intake of sodium <1550m, cholesterol <2038m calories 30% from fat and 7% or less from saturated fats, daily to have 5 or more servings of fruits and vegetables.  Biometrics:     Pre Biometrics - 07/31/15 1439    Pre Biometrics   Height '5\' 7"'  (1.702 m)   Weight 205 lb (92.987 kg)   Waist Circumference 40.5 inches   Hip Circumference 40.5 inches   Waist to Hip Ratio 1 %   BMI (Calculated) 32.2         Post Biometrics - 11/07/15 1643     Post  Biometrics   Height '5\' 7"'  (1.702 m)   Weight 214 lb 8 oz (97.297 kg)   Waist Circumference 43 inches   Hip Circumference 43 inches   Waist to Hip Ratio 1 %   BMI (Calculated) 33.7      Nutrition Therapy Plan and Nutrition Goals:     Nutrition Therapy & Goals - 08/14/15 1611    Nutrition Therapy   Diet 1700kcal DASH diet    Drug/Food Interactions Statins/Certain Fruits   Protein (specify units) 7-8oz   Fiber 30 grams   Whole Grain Foods 3 servings   Saturated Fats 13 max. grams   Fruits and Vegetables 5 servings/day   Sodium 1500 grams   Personal Nutrition Goals   Personal Goal #1 Drink more water each day -- at least 64 oz fluids daily   Personal Goal #2 Work to include fruits and vegetables on a more consistent basis   Personal Goal #3 Choose lean meats -- tuKuwaitersions of sausage, bacon, etc.   Comments Patient states he eats foods according to his taste any any particular time; for example, he might eat bananas 2-3 at a time for several days or weeks and then not eat any for 1-2 months.    Intervention Plan   Intervention Nutrition handout(s) given to patient.;Prescribe, educate and counsel regarding individualized specific dietary modifications aiming towards targeted core components such as weight, hypertension, lipid management, diabetes, heart failure and other comorbidities.   Expected Outcomes Short Term Goal: A plan has been developed with personal nutrition goals set during dietitian appointment.       Nutrition Discharge: Rate Your Plate Scores:     Nutrition Assessments - 11/07/15  1744    Rate Your Plate Scores   Post Score 65   Post Score % 72 %      Nutrition Goals Re-Evaluation:   Psychosocial: Target Goals: Acknowledge presence or absence of depression, maximize coping skills, provide positive support system. Participant is able to verbalize types and ability to use techniques and skills needed for reducing stress and depression.  Initial Review & Psychosocial Screening:     Initial Psych Review & Screening - 07/31/15 1817    Initial Review   Current issues with Current Depression;Current Sleep Concerns;Current Stress Concerns   Comments Shanon Brow informed nurse that he has been having anxiety issues (had bypass surgery in November 2016). Bardia described a couple of episodes where he became really anxious and on edge and he needed to be alone during those times; Darshan's mother passed away 3 weeks ago;    Family Dynamics   Good Support System? No   Comments reports having a close friend.  Baley's mother passed away 3 weeks ago; has 2 grown children and 8 grandkids and will soon have a great grandchild.  Dragon states he has never been on any medication for anxiety or depression.  In addition, prior to his heart bypass, Raeford was exercising 3 days a week for 2 hours prior to having heart  bypass.  Alma states he has gained weight since his bypass surgery.     Barriers   Psychosocial barriers to participate in program The patient should benefit from training in stress management and relaxation.   Screening Interventions   Interventions Program counselor consult;Encouraged to exercise      Quality of Life Scores:     Quality of Life - 11/07/15 1735    Quality of Life Scores   Health/Function Post 20.93 %   Socioeconomic Post 23.06 %   Psych/Spiritual Post 16.86 %   Family Post 21.2 %   GLOBAL Post 20.64 %      PHQ-9:     Recent Review Flowsheet Data     Depression screen [facility] 2/9 11/07/2015 07/31/2015   Decreased Interest 0 2   Down, Depressed, Hopeless 1 2   PHQ - 2 Score 1 4   Altered sleeping 2 2   Tired, decreased energy 3 1   Change in appetite 0 0   Feeling bad or failure about yourself  0 1   Trouble concentrating 0 0   Moving slowly or fidgety/restless 0 0   Suicidal thoughts 0 0   PHQ-9 Score 6 8   Difficult doing work/chores Not difficult at all Not difficult at all      Psychosocial Evaluation and Intervention:     Psychosocial Evaluation - 08/05/15 1613    Psychosocial Evaluation & Interventions   Interventions Encouraged to exercise with the program and follow exercise prescription;Stress management education;Relaxation education   Comments Counselor met with Mr. Sia today for initial psychological evaluation.  He is a 60 year old who has a family history of heart disease and had open heart surgery on 04/18/15. 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  Mr. Anstead has sleep apnea as well as cardiac issues and reports he has intermittent sleep often - although he uses his CPAP nightly.  He denies a history of depression, but has some current symptoms.  Counselor assessed for depression, with Mr. Simien reporting his mother just passed away 3 weeks ago and  accompanied by his current health and job stress, it is impacting him at this time.   He reported his current mood as an "8" on a scale of 1-10 with "10" being the best mood at this time.   Mr. Fils has goals to lose weight and get back in shape so he can return to the gym where he was working out typically 3x/week last summer and early fall.  He is also encouraged to attend the stress management and depression psychoeducational components of this program and meet with the dietician to address his weight loss goals.     Continued Psychosocial Services Needed Yes  Mr. Silliman  will benefit from the psychoeducational components of this program.  He also needs to meet with the dietician to address his weight loss goals.       Psychosocial Re-Evaluation:     Psychosocial Re-Evaluation      08/26/15 1700 08/28/15 1738 10/07/15 1712       Psychosocial Re-Evaluation   Comments Yazid taken to [facility] for c/o shortness of breath since last night and couldn't lie flat. Pulse ox room air in Cardiac 97% Bl 168/98. No chest pain.  I left vm to follow up with patient today. Was discharged to home from [facility] yesterday.  Follow up with Mr. Fleig today to assess how he is doing since the passing of his mother in January.  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 Counselor encouraged Mr. Badolato to see a counselor to talk about his grief and loss and this conflict, and he stated [facility] may [facility] with the [facility] re[facility].  Mr. Marsalis is also encouraged to engage in stress management activities to help with coping during this time.       Continued Psychosocial Services Needed   No        Vocational Rehabilitation: Provide vocational rehab assistance to qualifying candidates.   Vocational Rehab Evaluation & Intervention:     Vocational Rehab - 07/31/15 1803    Initial Vocational Rehab Evaluation & Intervention   Assessment shows need for Vocational Rehabilitation No      Education: Education Goals: Education classes will be provided on a weekly basis, covering required topics. Participant will state understanding/return demonstration of topics presented.  Learning Barriers/Preferences:     Learning Barriers/Preferences - 07/31/15 1800    Learning Barriers/Preferences   Learning Barriers None   Learning Preferences Written Material;Video;Group Instruction      Education Topics: General Nutrition Guidelines/Fats and Fiber: -Group instruction provided by  verbal, written material, models and posters to present the general guidelines for heart healthy nutrition. Gives an explanation and review of dietary fats and fiber.          Cardiac Rehab from 09/18/2015 in [facility] Cardiac and Pulmonary Rehab   Date  09/16/15   Educator  PI   Instruction Review Code  2- meets goals/outcomes      Controlling Sodium/Reading Food Labels: -Group verbal and written material supporting the discussion of sodium use in heart healthy nutrition. Review and explanation with models, verbal and written materials for utilization of the food label.   Exercise Physiology & Risk Factors: - Group verbal and written instruction with models to review the exercise physiology of the cardiovascular system and associated critical values. Details cardiovascular disease risk factors and the goals associated with each risk factor.  Cardiac Rehab from 09/18/2015 in [facility] Cardiac and Pulmonary Rehab   Date  08/12/15   Educator  sw   Instruction Review Code  2- meets goals/outcomes      Aerobic Exercise & Resistance Training: - Gives group verbal and written discussion on the health impact of inactivity. On the components of aerobic and resistive training programs and the benefits of this training and how to safely progress through these programs.      Cardiac Rehab from 09/18/2015 in [facility] Cardiac and Pulmonary Rehab   Date  08/14/15   Educator  B. Sickles   Instruction Review Code  2- meets goals/outcomes      Flexibility, Balance, General Exercise Guidelines: - Provides group verbal and written instruction on the benefits of flexibility and balance training programs. Provides general exercise guidelines with specific guidelines to those with heart or lung disease. Demonstration and skill practice provided.   Stress Management: - Provides group verbal and written instruction about the health risks of elevated stress, cause of high stress, and healthy ways to reduce stress.       Cardiac Rehab from 09/18/2015 in [facility] Cardiac and Pulmonary Rehab   Date  08/21/15   Educator  Kathreen Cornfield, [facility]   Instruction Review Code  2- meets goals/outcomes      Depression: - Provides group verbal and written instruction on the correlation between heart/lung disease and depressed mood, treatment options, and the stigmas associated with seeking treatment.      Cardiac Rehab from 09/18/2015 in [facility] Cardiac and Pulmonary Rehab   Date  09/18/15   Educator  Kathreen Cornfield, [facility]   Instruction Review Code  2- meets goals/outcomes      Anatomy & Physiology of the Heart: - Group verbal and written instruction and models provide basic cardiac anatomy and physiology, with the coronary electrical and arterial systems. Review of: AMI, Angina, Valve disease, Heart Failure, Cardiac Arrhythmia, Pacemakers, and the ICD.   Cardiac Procedures: - Group verbal and written instruction and models to describe the testing methods done to diagnose heart disease. Reviews the outcomes of the test results. Describes the treatment choices: Medical Management, Angioplasty, or Coronary Bypass Surgery.      Cardiac Rehab from 09/18/2015 in [facility] Cardiac and Pulmonary Rehab   Date  09/02/15   Educator  CE   Instruction Review Code  2- meets goals/outcomes      Cardiac Medications: - Group verbal and written instruction to review commonly prescribed medications for heart disease. Reviews the medication, class of the drug, and side effects. Includes the steps to properly store meds and maintain the prescription regimen.   Go Sex-Intimacy & Heart Disease, Get SMART - Goal Setting: - Group verbal and written instruction through game format to discuss heart disease and the return to sexual intimacy. Provides group verbal and written material to discuss and apply goal setting through the application of the S.M.A.R.T. Method.      Cardiac Rehab from 09/18/2015 in [facility] Cardiac and Pulmonary Rehab   Date  09/02/15   Educator  CE    Instruction Review Code  2- meets goals/outcomes      Other Matters of the Heart: - Provides group verbal, written materials and models to describe Heart Failure, Angina, Valve Disease, and Diabetes in the realm of heart disease. Includes description of the disease process and treatment options available to the cardiac patient.   Exercise & Equipment Safety: - Individual verbal instruction and demonstration of equipment use and safety with  use of the equipment.      Cardiac Rehab from 09/18/2015 in [facility] Cardiac and Pulmonary Rehab   Date  07/31/15   Educator  D. Joya Gaskins, RN   Instruction Review Code  1- partially meets, needs review/practice      Infection Prevention: - Provides verbal and written material to individual with discussion of infection control including proper hand washing and proper equipment cleaning during exercise session.      Cardiac Rehab from 09/18/2015 in [facility] Cardiac and Pulmonary Rehab   Date  07/31/15   Educator  D. Joya Gaskins, RN   Instruction Review Code  2- meets goals/outcomes      Falls Prevention: - Provides verbal and written material to individual with discussion of falls prevention and safety.      Cardiac Rehab from 09/18/2015 in [facility] Cardiac and Pulmonary Rehab   Date  07/31/15   Educator  D. Joya Gaskins, RN   Instruction Review Code  2- meets goals/outcomes      Diabetes: - Individual verbal and written instruction to review signs/symptoms of diabetes, desired ranges of glucose level fasting, after meals and with exercise. Advice that pre and post exercise glucose checks will be done for 3 sessions at entry of program.    Knowledge Questionnaire Score:     Knowledge Questionnaire Score - 11/07/15 1745    Knowledge Questionnaire Score   Post Score 24      Core Components/Risk Factors/Patient Goals at Admission:     Personal Goals and Risk Factors at Admission - 07/31/15 1815    Core Components/Risk Factors/Patient Goals on Admission     Weight Management Yes   Intervention (read-only) Learn and follow the exercise and diet guidelines while in the program. Utilize the nutrition and education classes to help gain knowledge of the diet and exercise expectations in the program   Admit Weight 205 lb (92.987 kg)   Goal Weight: Short Term 165 lb (74.844 kg)   Sedentary Yes   Intervention (read-only) While in program, learn and follow the exercise prescription taught. Start at a low level workload and increase workload after able to maintain previous level for 30 minutes. Increase time before increasing intensity.   Diabetes No   Hypertension Yes   Goal Participant will see blood pressure controlled within the values of 140/35m/Hg or within value directed by their physician.   Intervention (read-only) Provide nutrition & aerobic exercise along with prescribed medications to achieve BP 140/90 or less.   Lipids Yes   Goal Cholesterol controlled with medications as prescribed, with individualized exercise RX and with personalized nutrition plan. Value goals: LDL < 798m HDL > 4063mParticipant states understanding of desired cholesterol values and following prescriptions.   Intervention (read-only) Provide nutrition & aerobic exercise along with prescribed medications to achieve LDL <43m18mDL >40mg61mStress Yes   Goal To meet with psychosocial counselor for stress and relaxation information and guidance. To state understanding of performing relaxation techniques and or identifying personal stressors.   Intervention (read-only) Provide education on types of stress, identifiying stressors, and ways to cope with stress. Provide demonstration and active practice of relaxation techniques.   Take Less Medication Yes   Intervention Learn your risk factors and begin the lifestyle modifications for risk factor control during your time in the program.      Core Components/Risk Factors/Patient Goals Review:      Goals and Risk Factor Review       08/26/15 1659 08/28/15 1736 09/18/15 1737 11/03/15  1116     Core Components/Risk Factors/Patient Goals Review   Personal Goals Review Sedentary;Hypertension Sedentary;Hypertension Hypertension Hypertension    Review Raad taken to [facility] for c/o shortness of breath since last night and couldn't lie flat. Pulse ox room air in Cardiac 97% Bl 168/98. No chest pain.  Per [facility] note 08/27/2015:Pertinent labs & imaging results that were available during my care of the patient were reviewed by me and considered in my medical decision making (see chart for details). Anastacio's BP upon seeing the cardiologist at the [facility] on Friday, March 31st was 160/110.  Coreg dose doubled to 12.50 orally twice a day.  Today's BP upon check in was 132/74.  Rawleigh states he could tell his BP has been elevated at times as he feels like his head was going to explode.  He would have it checked somewhere and  the diastolic would be 697.  He stated he has not had these episodes since starting Coreg 12.50 mg twice a day.  Exit BP today was 136/74.   Eryc's BP numbers are improving. Entry [phone]  Post exercise 116/74-132/82.    Expected Outcomes Controlled blood pressure and no SOB Decreased SOB and no chest pain. Carley will see BP controlled within the values of 140/90 mmg Hg or within detected value directed by the physician.   Wolfgang will see BP controlled within the values of 140/90 mmg Hg or within detected value directed by the physician.         Core Components/Risk Factors/Patient Goals at Discharge (Final Review):      Goals and Risk Factor Review - 11/03/15 1116    Core Components/Risk Factors/Patient Goals Review   Personal Goals Review Hypertension   Review Cyruss's BP numbers are improving. Entry [phone]  Post exercise 116/74-132/82.   Expected Outcomes Rondo will see BP controlled within the values of 140/90 mmg Hg or within detected value directed by the physician.        ITP  Comments:     ITP Comments      08/06/15 0731 08/19/15 1356 08/26/15 1658 08/28/15 1737 09/05/15 1535   ITP Comments 30 day review.  Continue with ITP  new to program, to this date he has completed orientation and attended one session.  Shey is experiencing mild to severe pain related to the surgery. He will approach the exercises with caution and will not use weignts until he sees some relief with the muscle and chest wall pain.  Darryl left a vm today that he will not be able to attend Cardiac Rehab this evening but hopes to return on Wednesday.  Rayshawn taken to [facility] for c/o shortness of breath since last night and couldn't lie flat. Pulse ox room air in Cardiac 97% Bl 168/98. No chest pain.  Mahmoud was seen in [place] yesterday for SOB all day not able to lie flat. Not acute SOB in Cardiac Rehab but did not have him exercise on 08/27/2015 but taken to [facility] to be evaluated. Discharged to home-see EPIC note.  30 Day Review. Continue with the ITP.     10/06/15 1035 10/29/15 1041 11/03/15 1115       ITP Comments 30 Day review. Continue with ITP Aarin called and said he could attend yesterday due to being busy at work but wishes he could attend Cardiac Rehab.  30 day review. Continue with ITP        Comments:

## 2015-11-14 ENCOUNTER — Encounter: Payer: No Typology Code available for payment source | Attending: Internal Medicine

## 2015-11-14 DIAGNOSIS — G4733 Obstructive sleep apnea (adult) (pediatric): Secondary | ICD-10-CM | POA: Diagnosis not present

## 2015-11-14 DIAGNOSIS — I10 Essential (primary) hypertension: Secondary | ICD-10-CM | POA: Insufficient documentation

## 2015-11-14 DIAGNOSIS — Z87891 Personal history of nicotine dependence: Secondary | ICD-10-CM | POA: Insufficient documentation

## 2015-11-14 DIAGNOSIS — E785 Hyperlipidemia, unspecified: Secondary | ICD-10-CM | POA: Insufficient documentation

## 2015-11-14 DIAGNOSIS — Z951 Presence of aortocoronary bypass graft: Secondary | ICD-10-CM | POA: Diagnosis not present

## 2015-11-14 NOTE — Progress Notes (Signed)
Daily Session Note  Patient Details  Name: Jonathon Newman MRN: [id number] Date of Birth: [birth year] Referring Provider:    Encounter Date: 11/14/2015  Check In:     Session Check In - 11/14/15 1649    Check-In   Location [facility]   Staff Present Gerlene Burdock, RN, Vickki Hearing, BA, ACSM CEP, Exercise Physiologist;Diane Joya Gaskins, RN, BSN   Supervising physician immediately available to respond to emergencies See telemetry face sheet for immediately available ER MD   Medication changes reported     No   Fall or balance concerns reported    No   Warm-up and Cool-down Performed on first and last piece of equipment   Resistance Training Performed Yes   VAD Patient? No   Pain Assessment   Currently in Pain? No/denies         Goals Met:  Independence with exercise equipment Exercise tolerated well No report of cardiac concerns or symptoms Strength training completed today  Goals Unmet:  Not Applicable  Comments: Pt able to follow exercise prescription today without complaint.  Will continue to monitor for progression.   Dr. Emily Filbert is Medical Director for [facility] and [facility].

## 2015-11-18 ENCOUNTER — Encounter: Payer: No Typology Code available for payment source | Admitting: *Deleted

## 2015-11-18 DIAGNOSIS — Z951 Presence of aortocoronary bypass graft: Secondary | ICD-10-CM | POA: Diagnosis not present

## 2015-11-18 NOTE — Progress Notes (Signed)
Daily Session Note  Patient Details  Name: Jonathon Newman MRN: [id number] Date of Birth: Jan 01, 1956 Referring Provider:    Encounter Date: 11/18/2015  Check In:     Session Check In - 11/18/15 1633    Check-In   Location [facility]   Staff Present Nada Maclachlan, BA, ACSM CEP, Exercise Physiologist;Janifer Gieselman Amedeo Plenty, BS, ACSM CEP, Exercise Physiologist;Diane Joya Gaskins, RN, BSN   Supervising physician immediately available to respond to emergencies See telemetry face sheet for immediately available ER MD   Medication changes reported     No   Fall or balance concerns reported    No   Warm-up and Cool-down Performed on first and last piece of equipment   Resistance Training Performed Yes   VAD Patient? No   Pain Assessment   Currently in Pain? No/denies         Goals Met:  Independence with exercise equipment Exercise tolerated well No report of cardiac concerns or symptoms Strength training completed today  Goals Unmet:  Not Applicable  Comments: Patient completed exercise prescription and all exercise goals during rehab session. The exercise was tolerated well and the patient is progressing in the program.     Dr. Emily Filbert is Medical Director for [facility] and [facility].

## 2015-11-21 ENCOUNTER — Encounter: Payer: No Typology Code available for payment source | Admitting: *Deleted

## 2015-11-21 DIAGNOSIS — Z951 Presence of aortocoronary bypass graft: Secondary | ICD-10-CM

## 2015-11-21 NOTE — Progress Notes (Signed)
Cardiac Individual Treatment Plan  Patient Details  Name: Jonathon Newman MRN: [id number] Date of Birth: [birth year] Referring Provider:    Initial Encounter Date:       Cardiac Rehab from 07/31/2015 in [facility] Cardiac and Pulmonary Rehab   Date  07/31/15      Visit Diagnosis: S/P CABG x 2  Patient's Home Medications on Admission:  Current outpatient prescriptions:  .  aspirin 81 MG tablet, Take 81 mg by mouth daily., Disp: , Rfl:  .  atorvastatin (LIPITOR) 80 MG tablet, Take 80 mg by mouth daily., Disp: , Rfl:  .  carvedilol (COREG) 6.25 MG tablet, Take 6.25 mg by mouth 2 (two) times daily with a meal., Disp: , Rfl:  .  cyclobenzaprine (FLEXERIL) 10 MG tablet, Take 10 mg by mouth 3 (three) times daily as needed for muscle spasms., Disp: , Rfl:  .  docusate sodium (COLACE) 100 MG capsule, Take 100 mg by mouth 2 (two) times daily., Disp: , Rfl:  .  furosemide (LASIX) 20 MG tablet, Take 20 mg by mouth 3 (three) times daily., Disp: , Rfl:  .  gabapentin (NEURONTIN) 300 MG capsule, Take 300 mg by mouth 3 (three) times daily., Disp: , Rfl:  .  HYDROcodone-acetaminophen (NORCO) 10-325 MG tablet, Take 1 tablet by mouth every 6 (six) hours as needed., Disp: , Rfl:  .  hydrOXYzine (ATARAX/VISTARIL) 25 MG tablet, Take 25 mg by mouth 3 (three) times daily as needed., Disp: , Rfl:  .  losartan (COZAAR) 50 MG tablet, Take 50 mg by mouth daily., Disp: , Rfl:  .  pantoprazole (PROTONIX) 20 MG tablet, Take 20 mg by mouth daily., Disp: , Rfl:  .  predniSONE (DELTASONE) 20 MG tablet, Take 2 tablets (40 mg total) by mouth daily., Disp: 8 tablet, Rfl: 0  Past Medical History: Past Medical History  Diagnosis Date  . Hypertension   . Hypercholesteremia   . S/P coronary artery bypass graft x 2     Tobacco Use: History  Smoking status  . Former Smoker  Smokeless tobacco  . Not on file    Labs: Recent Review Flowsheet Data    There is no flowsheet data to display.       Exercise Target Goals:     Exercise Program Goal: Individual exercise prescription set with THRR, safety & activity barriers. Participant demonstrates ability to understand and report RPE using BORG scale, to self-measure pulse accurately, and to acknowledge the importance of the exercise prescription.  Exercise Prescription Goal: Starting with aerobic activity 30 plus minutes a day, 3 days per week for initial exercise prescription. Provide home exercise prescription and guidelines that participant acknowledges understanding prior to discharge.  Activity Barriers & Risk Stratification:     Activity Barriers & Cardiac Risk Stratification - 07/31/15 1756    Activity Barriers & Cardiac Risk Stratification   Activity Barriers Arthritis;Back Problems;Joint Problems;Neck/Spine Problems  [name] has herniated discs at L3-5; s/p neck surgery for herniated disc in neck; right anterior lateral thigh numbness since CABG; Scheduled for MRI of back in next couple weeks to evaluate if right thigh pain/numbness coming from disc problems    Cardiac Risk Stratification High      6 Minute Walk:     6 Minute Walk      07/31/15 1440 11/07/15 1641 11/07/15 1654   6 Minute Walk   Phase Initial Discharge Discharge   Distance 1650 feet 2000 feet    Distance % Change   21 %  Walk Time 6 minutes 6 minutes    # of Rest Breaks 0     MPH 3     RPE 12 14    Symptoms No     Resting HR 84 bpm 78 bpm    Resting BP 136/86 mmHg 124/62 mmHg    Max Ex. HR 120 bpm 116 bpm    Max Ex. BP 164/90 mmHg 170/80 mmHg    Interval Oxygen   Interval Oxygen?  Yes    Baseline Oxygen Saturation %  98 %    6 Minute Oxygen Saturation %  97 %       Initial Exercise Prescription:     Initial Exercise Prescription - 07/31/15 1500    Date of Initial Exercise RX and Referring Provider   Date 07/31/15   Treadmill   MPH 2.8   Grade 0   Minutes 10   Bike   Level 0.4   Watts 15   Minutes 15   Recumbant Bike   Level 4   RPM 40   Watts 35    Minutes 15   NuStep   Level 3   Watts 40   Minutes 15   Arm Ergometer   Level 1   Watts 8   Minutes 10   Arm/Foot Ergometer   Level 4   Watts 12   Minutes 10   Cybex   Level 3   RPM 50   Minutes 15   Recumbant Elliptical   Level 2   RPM 40   Watts 15   Minutes 15   Elliptical   Level 1   Speed 3   Minutes 1   REL-XR   Level 3   Watts 45   Minutes 15   T5 Nustep   Level 2   Watts 20   Minutes 15   Biostep-RELP   Level 3   Watts 20   Minutes 15   Prescription Details   Frequency (times per week) 3   Duration Progress to 30 minutes of continuous aerobic without signs/symptoms of physical distress   Intensity   THRR REST +  30   Ratings of Perceived Exertion 11-15   Progression   Progression Continue progressive overload as per policy without signs/symptoms or physical distress.   Resistance Training   Training Prescription Yes   Weight 3   Reps 10-15      Perform Capillary Blood Glucose checks as needed.  Exercise Prescription Changes:     Exercise Prescription Changes      08/05/15 1700 08/14/15 1600 08/21/15 1108 09/18/15 1600 09/30/15 1348   Exercise Review   Progression   Yes Yes Yes   Response to Exercise   Blood Pressure (Admit)   138/82 mmHg  142/78 mmHg   Blood Pressure (Exercise)   168/80 mmHg  174/80 mmHg   Blood Pressure (Exit)   150/88 mmHg  126/84 mmHg   Heart Rate (Admit)   77 bpm  92 bpm   Heart Rate (Exercise)   113 bpm  101 bpm   Heart Rate (Exit)   84 bpm  81 bpm   Rating of Perceived Exertion (Exercise)   12  11   Symptoms   None  None   Comments Patient attended first day of cardiac rehabilitation. Following exercise on treadmill, patient stated felt dizzy.  BP taken in sitting with reading of 128/78.  Given water with dizziness abating.  Resumed exercise on another piece of equipment.  No other adverse  effects noted.  Reviewed individualized exercise prescription and made increases per departmental policy. Exercise increases  were discussed with the patient and they were able to perform the new work loads without issue (no signs or symptoms).  Reviewed individualized exercise prescription and made increases per departmental policy. Exercise increases were discussed with the patient and they were able to perform the new work loads without issue (no signs or symptoms).     Duration   Progress to 45 minutes of aerobic exercise without signs/symptoms of physical distress Progress to 45 minutes of aerobic exercise without signs/symptoms of physical distress Progress to 50 minutes of aerobic without signs/symptoms of physical distress   Intensity   Rest + 30 Rest + 30 Rest + 30   Progression   Progression   Continue progressive overload as per policy without signs/symptoms or physical distress. Continue progressive overload as per policy without signs/symptoms or physical distress. Continue to progress workloads to maintain intensity without signs/symptoms of physical distress.   Resistance Training   Training Prescription  Yes Yes Yes Yes   Weight  '5 5 5 5   ' Reps  10-15 10-15 10-15 10-15   Interval Training   Interval Training     Yes   Equipment     REL-XR   Comments     L8-L10   Treadmill   MPH  2.8 2.8 2.3 3   Grade  0 0 0 3.5   Minutes  '15 15 15 15   ' REL-XR   Level  '5 6 6 10   ' Watts  45 90 90 120   Minutes  '20 20 20    ' Home Exercise Plan   Plans to continue exercise at     [facility] (comment)     10/10/15 1700 10/17/15 1700 10/29/15 1500 11/07/15 1600     Exercise Review   Progression Yes Yes Yes     Response to Exercise   Blood Pressure (Admit)   128/84 mmHg     Blood Pressure (Exercise)   168/60 mmHg     Blood Pressure (Exit)   116/74 mmHg     Heart Rate (Admit)   79 bpm     Heart Rate (Exercise)   108 bpm     Heart Rate (Exit)   80 bpm     Rating of Perceived Exertion (Exercise)   13     Symptoms   None     Duration Progress to 50 minutes of aerobic without signs/symptoms of physical  distress Progress to 50 minutes of aerobic without signs/symptoms of physical distress Progress to 50 minutes of aerobic without signs/symptoms of physical distress     Intensity Rest + 30 Rest + 30 THRR New  40-80% THRR 65-130 bpm     Progression   Progression Continue to progress workloads to maintain intensity without signs/symptoms of physical distress. Continue to progress workloads to maintain intensity without signs/symptoms of physical distress. Continue to progress workloads to maintain intensity without signs/symptoms of physical distress.     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  He continues to voice discomfort which is localized to the  left breast area, stating his cardiologist relates it to residual healing from surgery.  Plan is to continue to progress with program and assist with transition to community exercise upon completion of Heart Track. Mclane continues to attend Heart Track and is progressing without difficulty.         Discharge Exercise Prescription (Final Exercise Prescription Changes):     Exercise Prescription Changes - 11/07/15 1600    Home Exercise Plan   Plans to continue exercise at [facility] (comment)      Nutrition:  Target Goals: Understanding of nutrition guidelines,  daily intake of sodium <1550m, cholesterol <2038m calories 30% from fat and 7% or less from saturated fats, daily to have 5 or more servings of fruits and vegetables.  Biometrics:     Pre Biometrics - 07/31/15 1439    Pre Biometrics   Height '5\' 7"'  (1.702 m)   Weight 205 lb (92.987 kg)   Waist Circumference 40.5 inches   Hip Circumference 40.5 inches   Waist to Hip Ratio 1 %   BMI (Calculated) 32.2         Post Biometrics - 11/07/15 1643     Post  Biometrics   Height '5\' 7"'  (1.702 m)   Weight 214 lb 8 oz (97.297 kg)   Waist Circumference 43 inches   Hip Circumference 43 inches   Waist to Hip Ratio 1 %   BMI (Calculated) 33.7      Nutrition Therapy Plan and Nutrition Goals:     Nutrition Therapy & Goals - 08/14/15 1611    Nutrition Therapy   Diet 1700kcal DASH diet    Drug/Food Interactions Statins/Certain Fruits   Protein (specify units) 7-8oz   Fiber 30 grams   Whole Grain Foods 3 servings   Saturated Fats 13 max. grams   Fruits and Vegetables 5 servings/day   Sodium 1500 grams   Personal Nutrition Goals   Personal Goal #1 Drink more water each day -- at least 64 oz fluids daily   Personal Goal #2 Work to include fruits and vegetables on a more consistent basis   Personal Goal #3 Choose lean meats -- tuKuwaitersions of sausage, bacon, etc.   Comments Patient states he eats foods according to his taste any any particular time; for example, he might eat bananas 2-3 at a time for several days or weeks and then not eat any for 1-2 months.    Intervention Plan   Intervention Nutrition handout(s) given to patient.;Prescribe, educate and counsel regarding individualized specific dietary modifications aiming towards targeted core components such as weight, hypertension, lipid management, diabetes, heart failure and other comorbidities.   Expected Outcomes Short Term Goal: A plan has been developed with personal nutrition goals set during dietitian appointment.       Nutrition Discharge: Rate Your Plate Scores:     Nutrition Assessments - 11/07/15  1744    Rate Your Plate Scores   Post Score 65   Post Score % 72 %      Nutrition Goals Re-Evaluation:   Psychosocial: Target Goals: Acknowledge presence or absence of depression, maximize coping skills, provide positive support system. Participant is able to verbalize types and ability to use techniques and skills needed for reducing stress and depression.  Initial Review & Psychosocial Screening:     Initial Psych Review & Screening - 07/31/15 1817    Initial Review   Current issues with Current Depression;Current Sleep Concerns;Current Stress Concerns   Comments Onalee Hua informed nurse that he has been having anxiety issues (had bypass surgery in November 2016). Yasin described a couple of episodes where he became really anxious and on edge and he needed to be alone during those times; Louden's mother passed away 3 weeks ago;    Family Dynamics   Good Support System? No   Comments reports having a close friend.  Caisen's mother passed away 3 weeks ago; has 2 grown children and 8 grandkids and will soon have a great grandchild.  Eldo states he has never been on any medication for anxiety or depression.  In addition, prior to his heart bypass, Kashaun was exercising 3 days a week for 2 hours prior to having heart  bypass.  Deeric states he has gained weight since his bypass surgery.     Barriers   Psychosocial barriers to participate in program The patient should benefit from training in stress management and relaxation.   Screening Interventions   Interventions Program counselor consult;Encouraged to exercise      Quality of Life Scores:     Quality of Life - 11/07/15 1735    Quality of Life Scores   Health/Function Post 20.93 %   Socioeconomic Post 23.06 %   Psych/Spiritual Post 16.86 %   Family Post 21.2 %   GLOBAL Post 20.64 %      PHQ-9:     Recent Review Flowsheet Data     Depression screen [facility] 2/9 11/07/2015 07/31/2015   Decreased Interest 0 2   Down, Depressed, Hopeless 1 2   PHQ - 2 Score 1 4   Altered sleeping 2 2   Tired, decreased energy 3 1   Change in appetite 0 0   Feeling bad or failure about yourself  0 1   Trouble concentrating 0 0   Moving slowly or fidgety/restless 0 0   Suicidal thoughts 0 0   PHQ-9 Score 6 8   Difficult doing work/chores Not difficult at all Not difficult at all      Psychosocial Evaluation and Intervention:     Psychosocial Evaluation - 11/18/15 1737    Discharge Psychosocial Assessment & Intervention   Comments Final meeting with Mr. Nghiem today as his last day is later this week.  He reports having benefitted from this program and feels more stamina since coming consistently.  He has gone back to work and is managing that well.  He reports continuing to sleep well with some help occasionally from Benadryl or a mild OTC sleep aid.  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  He has not yet connected with a counselor to help with this and was going to speak with his doctor about this soon.  Mr. Bozard plans to return to working out consistently at the gym early in the  morning and hopes to lose some of the weight he has gained since the surgery.  Counselor commended Mr. Cain on all his hard work and encouraged him to continue positive self care and healthy decision making.        Psychosocial Re-Evaluation:     Psychosocial Re-Evaluation      08/26/15 1700 08/28/15 1738 10/07/15 1712       Psychosocial Re-Evaluation   Comments [facility]/o shortness of breath since last night and couldn't lie flat. Pulse ox room air in Cardiac 97% Bl 168/98. No chest pain.  I left vm to follow up with patient today. [facility]  Follow up with Mr. Gerri LinsVanwormer today to  assess how he is doing since the passing of his mother in January.  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 Counselor encouraged Mr. Launer to see a counselor to talk about his grief and loss and this conflict, and he stated [facility].  Mr. Gerri LinsVanwormer is also encouraged to engage in stress management activities to help with coping during this time.       Continued Psychosocial Services Needed   No        Vocational Rehabilitation: Provide vocational rehab assistance to qualifying candidates.   Vocational Rehab Evaluation & Intervention:     Vocational Rehab - 07/31/15 1803    Initial Vocational Rehab Evaluation & Intervention   Assessment shows need for Vocational Rehabilitation No      Education: Education Goals: Education classes will be provided on a weekly basis, covering required topics. Participant will state understanding/return demonstration of topics presented.  Learning Barriers/Preferences:     Learning Barriers/Preferences - 07/31/15 1800    Learning Barriers/Preferences   Learning Barriers None   Learning Preferences Written Material;Video;Group Instruction      Education Topics: General Nutrition Guidelines/Fats and Fiber: -Group instruction provided by verbal, written material, models and posters to present the general guidelines for heart healthy nutrition. Gives an explanation and review of dietary fats and fiber.          [facility]   Date  09/16/15   Educator  PI   Instruction Review Code  2- meets goals/outcomes      Controlling Sodium/Reading Food Labels: -Group verbal and written material supporting the discussion of sodium use in heart healthy nutrition. Review and explanation with models, verbal and written materials for utilization of the food label.   Exercise Physiology  & Risk Factors: - Group verbal and written instruction with models to review the exercise physiology of the cardiovascular system and associated critical values. Details cardiovascular disease risk factors and the goals associated with each risk factor.      [facility]   Date  08/12/15   Educator  sw   Instruction Review Code  2- meets goals/outcomes      Aerobic Exercise & Resistance Training: - Gives group verbal and written discussion on the health impact of inactivity. On the components of aerobic and resistive training programs and the benefits of this training and how to safely progress through these programs.      [facility]   Date  08/14/15  Educator  B. Sickles   Instruction Review Code  2- meets goals/outcomes      Flexibility, Balance, General Exercise Guidelines: - Provides group verbal and written instruction on the benefits of flexibility and balance training programs. Provides general exercise guidelines with specific guidelines to those with heart or lung disease. Demonstration and skill practice provided.   Stress Management: - Provides group verbal and written instruction about the health risks of elevated stress, cause of high stress, and healthy ways to reduce stress.      [facility]   Date  08/21/15   Educator  Belva CromeK. Clayton, [facility]   Instruction Review Code  2- meets goals/outcomes      Depression: - Provides group verbal and written instruction on the correlation between heart/lung disease and depressed mood, treatment options, and the stigmas associated with seeking treatment.      [facility]   Date  09/18/15   Educator  Belva CromeK. Clayton, [facility]   Instruction Review Code  2- meets goals/outcomes      Anatomy & Physiology of the Heart: - Group verbal and  written instruction and models provide basic cardiac anatomy and physiology, with the coronary electrical and arterial systems. Review of: AMI, Angina, Valve disease, Heart Failure, Cardiac Arrhythmia, Pacemakers, and the ICD.   Cardiac Procedures: - Group verbal and written instruction and models to describe the testing methods done to diagnose heart disease. Reviews the outcomes of the test results. Describes the treatment choices: Medical Management, Angioplasty, or Coronary Bypass Surgery.      [facility]   Date  09/02/15   Educator  CE   Instruction Review Code  2- meets goals/outcomes      Cardiac Medications: - Group verbal and written instruction to review commonly prescribed medications for heart disease. Reviews the medication, class of the drug, and side effects. Includes the steps to properly store meds and maintain the prescription regimen.   Go Sex-Intimacy & Heart Disease, Get SMART - Goal Setting: - Group verbal and written instruction through game format to discuss heart disease and the return to sexual intimacy. Provides group verbal and written material to discuss and apply goal setting through the application of the S.M.A.R.T. Method.      [facility]   Date  09/02/15   Educator  CE   Instruction Review Code  2- meets goals/outcomes      Other Matters of the Heart: - Provides group verbal, written materials and models to describe Heart Failure, Angina, Valve Disease, and Diabetes in the realm of heart disease. Includes description of the disease process and treatment options available to the cardiac patient.   Exercise & Equipment Safety: - Individual verbal instruction and demonstration of equipment use and safety with use of the equipment.      [facility]   Date  07/31/15   Educator  D. Delford FieldWright, RN    Instruction Review Code  1- partially meets, needs review/practice      Infection Prevention: - Provides verbal and written material to individual with discussion of infection control including proper hand washing and proper equipment cleaning during exercise session.      [facility]   Date  07/31/15   Educator  Loanne Drilling, RN   Instruction Review Code  2- meets goals/outcomes      Falls Prevention: - Provides verbal and written material to individual with discussion of falls prevention and safety.      Cardiac Rehab from 09/18/2015 in [facility] Cardiac and Pulmonary Rehab   Date  07/31/15   Educator  D. Delford Field, RN   Instruction Review Code  2- meets goals/outcomes      Diabetes: - Individual verbal and written instruction to review signs/symptoms of diabetes, desired ranges of glucose level fasting, after meals and with exercise. Advice that pre and post exercise glucose checks will be done for 3 sessions at entry of program.    Knowledge Questionnaire Score:     Knowledge Questionnaire Score - 11/07/15 1745    Knowledge Questionnaire Score   Post Score 24      Core Components/Risk Factors/Patient Goals at Admission:     Personal Goals and Risk Factors at Admission - 07/31/15 1815    Core Components/Risk Factors/Patient Goals on Admission    Weight Management Yes   Intervention (read-only) Learn and follow the exercise and diet guidelines while in the program. Utilize the nutrition and education classes to help gain knowledge of the diet and exercise expectations in the program   Admit Weight 205 lb (92.987 kg)   Goal Weight: Short Term 165 lb (74.844 kg)   Sedentary Yes   Intervention (read-only) While in program, learn and follow the exercise prescription taught. Start at a low level workload and increase workload after able to maintain previous level for 30 minutes. Increase time before increasing intensity.   Diabetes No    Hypertension Yes   Goal Participant will see blood pressure controlled within the values of 140/13mm/Hg or within value directed by their physician.   Intervention (read-only) Provide nutrition & aerobic exercise along with prescribed medications to achieve BP 140/90 or less.   Lipids Yes   Goal Cholesterol controlled with medications as prescribed, with individualized exercise RX and with personalized nutrition plan. Value goals: LDL < , HDL > . Participant states understanding of desired cholesterol values and following prescriptions.   Intervention (read-only) Provide nutrition & aerobic exercise along with prescribed medications to achieve LDL 70mg , HDL >40mg .   Stress Yes   Goal To meet with psychosocial counselor for stress and relaxation information and guidance. To state understanding of performing relaxation techniques and or identifying personal stressors.   Intervention (read-only) Provide education on types of stress, identifiying stressors, and ways to cope with stress. Provide demonstration and active practice of relaxation techniques.   Take Less Medication Yes   Intervention Learn your risk factors and begin the lifestyle modifications for risk factor control during your time in the program.      Core Components/Risk Factors/Patient Goals Review:      Goals and Risk Factor Review      08/26/15 1659 08/28/15 1736 09/18/15 1737 11/03/15 1116     Core Components/Risk Factors/Patient Goals Review   Personal Goals Review Sedentary;Hypertension Sedentary;Hypertension Hypertension Hypertension    Review Rishon taken to [facility] Dept for c/o shortness of breath since last night and couldn't lie flat. Pulse ox room air in Cardiac 97% Bl 168/98. No chest pain.  Per [facility] Emerg [facility] note 08/27/2015:Pertinent labs & imaging results that were available during my care of the patient were reviewed by me and considered in my medical decision making (see chart for details). Mahad's BP  upon seeing the cardiologist at the [facility]  on Friday, March 31st was 160/110.  Coreg dose doubled to 12.50 orally twice a day.  Today's BP upon check in was 132/74.  Jerime states he could tell his BP has been elevated at times as he feels like his head was going to explode.  He would have it checked somewhere and  the diastolic would be 200.  He stated he has not had these episodes since starting Coreg 12.50 mg twice a day.  Exit BP today was 136/74.   Cooper's BP numbers are improving. Entry [phone]  Post exercise 116/74-132/82.    Expected Outcomes Controlled blood pressure and no SOB Decreased SOB and no chest pain. Dorsel will see BP controlled within the values of 140/90 mmg Hg or within detected value directed by the physician.   Jordany will see BP controlled within the values of 140/90 mmg Hg or within detected value directed by the physician.         Core Components/Risk Factors/Patient Goals at Discharge (Final Review):      Goals and Risk Factor Review - 11/03/15 1116    Core Components/Risk Factors/Patient Goals Review   Personal Goals Review Hypertension   Review Equan's BP numbers are improving. Entry [phone]  Post exercise 116/74-132/82.   Expected Outcomes Thao will see BP controlled within the values of 140/90 mmg Hg or within detected value directed by the physician.        ITP Comments:     ITP Comments      08/06/15 0731 08/19/15 1356 08/26/15 1658 08/28/15 1737 09/05/15 1535   ITP Comments 30 day review.  Continue with ITP  new to program, to this date he has completed orientation and attended one session.  Lycan is experiencing mild to severe pain related to the surgery. He will approach the exercises with caution and will not use weignts until he sees some relief with the muscle and chest wall pain.  Eulises left a vm today that he will not be able to attend Cardiac Rehab this evening but hopes to return on Wednesday.  Stefano taken to [facility] Emerg Dept for c/o shortness of  breath since last night and couldn't lie flat. Pulse ox room air in Cardiac 97% Bl 168/98. No chest pain.  Jamaine was seen in [facility] Emerg Dept yesterday for SOB all day not able to lie flat. Not acute SOB in Cardiac Rehab but did not have him exercise on 08/27/2015 but taken to Emerg Dept to be evaluated. Discharged to home-see EPIC note.  30 Day Review. Continue with the ITP.     10/06/15 1035 10/29/15 1041 11/03/15 1115       ITP Comments 30 Day review. Continue with ITP Laban called and said he could attend yesterday due to being busy at work but wishes he could attend Cardiac Rehab.  30 day review. Continue with ITP        Comments:

## 2015-11-21 NOTE — Progress Notes (Signed)
Cardiac Individual Treatment Plan  Patient Details  Name: Jonathon Newman MRN: [id number] Date of Birth: [birth year] Referring Provider:    Initial Encounter Date:       Cardiac Rehab from 07/31/2015 in [facility] Cardiac and Pulmonary Rehab   Date  07/31/15      Visit Diagnosis: S/P CABG x 2  Patient's Home Medications on Admission:  Current outpatient prescriptions:  .  aspirin 81 MG tablet, Take 81 mg by mouth daily., Disp: , Rfl:  .  atorvastatin (LIPITOR) 80 MG tablet, Take 80 mg by mouth daily., Disp: , Rfl:  .  carvedilol (COREG) 6.25 MG tablet, Take 6.25 mg by mouth 2 (two) times daily with a meal., Disp: , Rfl:  .  cyclobenzaprine (FLEXERIL) 10 MG tablet, Take 10 mg by mouth 3 (three) times daily as needed for muscle spasms., Disp: , Rfl:  .  docusate sodium (COLACE) 100 MG capsule, Take 100 mg by mouth 2 (two) times daily., Disp: , Rfl:  .  furosemide (LASIX) 20 MG tablet, Take 20 mg by mouth 3 (three) times daily., Disp: , Rfl:  .  gabapentin (NEURONTIN) 300 MG capsule, Take 300 mg by mouth 3 (three) times daily., Disp: , Rfl:  .  HYDROcodone-acetaminophen (NORCO) 10-325 MG tablet, Take 1 tablet by mouth every 6 (six) hours as needed., Disp: , Rfl:  .  hydrOXYzine (ATARAX/VISTARIL) 25 MG tablet, Take 25 mg by mouth 3 (three) times daily as needed., Disp: , Rfl:  .  losartan (COZAAR) 50 MG tablet, Take 50 mg by mouth daily., Disp: , Rfl:  .  pantoprazole (PROTONIX) 20 MG tablet, Take 20 mg by mouth daily., Disp: , Rfl:  .  predniSONE (DELTASONE) 20 MG tablet, Take 2 tablets (40 mg total) by mouth daily., Disp: 8 tablet, Rfl: 0  Past Medical History: Past Medical History  Diagnosis Date  . Hypertension   . Hypercholesteremia   . S/P coronary artery bypass graft x 2     Tobacco Use: History  Smoking status  . Former Smoker  Smokeless tobacco  . Not on file    Labs: Recent Review Flowsheet Data    There is no flowsheet data to display.       Exercise Target Goals:     Exercise Program Goal: Individual exercise prescription set with THRR, safety & activity barriers. Participant demonstrates ability to understand and report RPE using BORG scale, to self-measure pulse accurately, and to acknowledge the importance of the exercise prescription.  Exercise Prescription Goal: Starting with aerobic activity 30 plus minutes a day, 3 days per week for initial exercise prescription. Provide home exercise prescription and guidelines that participant acknowledges understanding prior to discharge.  Activity Barriers & Risk Stratification:     Activity Barriers & Cardiac Risk Stratification - 07/31/15 1756    Activity Barriers & Cardiac Risk Stratification   Activity Barriers Arthritis;Back Problems;Joint Problems;Neck/Spine Problems  Jonathon Newman has herniated discs at L3-5; s/p neck surgery for herniated disc in neck; right anterior lateral thigh numbness since CABG; Scheduled for MRI of back in next couple weeks to evaluate if right thigh pain/numbness coming from disc problems    Cardiac Risk Stratification High      6 Minute Walk:     6 Minute Walk      07/31/15 1440 11/07/15 1641 11/07/15 1654   6 Minute Walk   Phase Initial Discharge Discharge   Distance 1650 feet 2000 feet    Distance % Change   21 %  Walk Time 6 minutes 6 minutes    # of Rest Breaks 0     MPH 3     RPE 12 14    Symptoms No     Resting HR 84 bpm 78 bpm    Resting BP 136/86 mmHg 124/62 mmHg    Max Ex. HR 120 bpm 116 bpm    Max Ex. BP 164/90 mmHg 170/80 mmHg    Interval Oxygen   Interval Oxygen?  Yes    Baseline Oxygen Saturation %  98 %    6 Minute Oxygen Saturation %  97 %       Initial Exercise Prescription:     Initial Exercise Prescription - 07/31/15 1500    Date of Initial Exercise RX and Referring Provider   Date 07/31/15   Treadmill   MPH 2.8   Grade 0   Minutes 10   Bike   Level 0.4   Watts 15   Minutes 15   Recumbant Bike   Level 4   RPM 40   Watts 35    Minutes 15   NuStep   Level 3   Watts 40   Minutes 15   Arm Ergometer   Level 1   Watts 8   Minutes 10   Arm/Foot Ergometer   Level 4   Watts 12   Minutes 10   Cybex   Level 3   RPM 50   Minutes 15   Recumbant Elliptical   Level 2   RPM 40   Watts 15   Minutes 15   Elliptical   Level 1   Speed 3   Minutes 1   REL-XR   Level 3   Watts 45   Minutes 15   T5 Nustep   Level 2   Watts 20   Minutes 15   Biostep-RELP   Level 3   Watts 20   Minutes 15   Prescription Details   Frequency (times per week) 3   Duration Progress to 30 minutes of continuous aerobic without signs/symptoms of physical distress   Intensity   THRR REST +  30   Ratings of Perceived Exertion 11-15   Progression   Progression Continue progressive overload as per policy without signs/symptoms or physical distress.   Resistance Training   Training Prescription Yes   Weight 3   Reps 10-15      Perform Capillary Blood Glucose checks as needed.  Exercise Prescription Changes:     Exercise Prescription Changes      08/05/15 1700 08/14/15 1600 08/21/15 1108 09/18/15 1600 09/30/15 1348   Exercise Review   Progression   Yes Yes Yes   Response to Exercise   Blood Pressure (Admit)   138/82 mmHg  142/78 mmHg   Blood Pressure (Exercise)   168/80 mmHg  174/80 mmHg   Blood Pressure (Exit)   150/88 mmHg  126/84 mmHg   Heart Rate (Admit)   77 bpm  92 bpm   Heart Rate (Exercise)   113 bpm  101 bpm   Heart Rate (Exit)   84 bpm  81 bpm   Rating of Perceived Exertion (Exercise)   12  11   Symptoms   None  None   Comments Patient attended first day of cardiac rehabilitation. Following exercise on treadmill, patient stated felt dizzy.  BP taken in sitting with reading of 128/78.  Given water with dizziness abating.  Resumed exercise on another piece of equipment.  No other adverse  effects noted.  Reviewed individualized exercise prescription and made increases per departmental policy. Exercise increases  were discussed with the patient and they were able to perform the new work loads without issue (no signs or symptoms).  Reviewed individualized exercise prescription and made increases per departmental policy. Exercise increases were discussed with the patient and they were able to perform the new work loads without issue (no signs or symptoms).     Duration   Progress to 45 minutes of aerobic exercise without signs/symptoms of physical distress Progress to 45 minutes of aerobic exercise without signs/symptoms of physical distress Progress to 50 minutes of aerobic without signs/symptoms of physical distress   Intensity   Rest + 30 Rest + 30 Rest + 30   Progression   Progression   Continue progressive overload as per policy without signs/symptoms or physical distress. Continue progressive overload as per policy without signs/symptoms or physical distress. Continue to progress workloads to maintain intensity without signs/symptoms of physical distress.   Resistance Training   Training Prescription  Yes Yes Yes Yes   Weight  '5 5 5 5   ' Reps  10-15 10-15 10-15 10-15   Interval Training   Interval Training     Yes   Equipment     REL-XR   Comments     L8-L10   Treadmill   MPH  2.8 2.8 2.3 3   Grade  0 0 0 3.5   Minutes  '15 15 15 15   ' REL-XR   Level  '5 6 6 10   ' Watts  45 90 90 120   Minutes  '20 20 20    ' Home Exercise Plan   Plans to continue exercise at     [facility] (comment)     10/10/15 1700 10/17/15 1700 10/29/15 1500 11/07/15 1600     Exercise Review   Progression Yes Yes Yes     Response to Exercise   Blood Pressure (Admit)   128/84 mmHg     Blood Pressure (Exercise)   168/60 mmHg     Blood Pressure (Exit)   116/74 mmHg     Heart Rate (Admit)   79 bpm     Heart Rate (Exercise)   108 bpm     Heart Rate (Exit)   80 bpm     Rating of Perceived Exertion (Exercise)   13     Symptoms   None     Duration Progress to 50 minutes of aerobic without signs/symptoms of physical  distress Progress to 50 minutes of aerobic without signs/symptoms of physical distress Progress to 50 minutes of aerobic without signs/symptoms of physical distress     Intensity Rest + 30 Rest + 30 THRR New  40-80% THRR 65-130 bpm     Progression   Progression Continue to progress workloads to maintain intensity without signs/symptoms of physical distress. Continue to progress workloads to maintain intensity without signs/symptoms of physical distress. Continue to progress workloads to maintain intensity without signs/symptoms of physical distress.     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  He continues to voice discomfort which is localized to the  left breast area, stating his cardiologist relates it to residual healing from surgery.  Plan is to continue to progress with program and assist with transition to community exercise upon completion of Heart Track. Jonathon Newman continues to attend Heart Track and is progressing without difficulty.         Discharge Exercise Prescription (Final Exercise Prescription Changes):     Exercise Prescription Changes - 11/07/15 1600    Home Exercise Plan   Plans to continue exercise at [facility] (comment)      Nutrition:  Target Goals: Understanding of nutrition guidelines,  daily intake of sodium <1550m, cholesterol <2038m calories 30% from fat and 7% or less from saturated fats, daily to have 5 or more servings of fruits and vegetables.  Biometrics:     Pre Biometrics - 07/31/15 1439    Pre Biometrics   Height '5\' 7"'  (1.702 m)   Weight 205 lb (92.987 kg)   Waist Circumference 40.5 inches   Hip Circumference 40.5 inches   Waist to Hip Ratio 1 %   BMI (Calculated) 32.2         Post Biometrics - 11/07/15 1643     Post  Biometrics   Height '5\' 7"'  (1.702 m)   Weight 214 lb 8 oz (97.297 kg)   Waist Circumference 43 inches   Hip Circumference 43 inches   Waist to Hip Ratio 1 %   BMI (Calculated) 33.7      Nutrition Therapy Plan and Nutrition Goals:     Nutrition Therapy & Goals - 08/14/15 1611    Nutrition Therapy   Diet 1700kcal DASH diet    Drug/Food Interactions Statins/Certain Fruits   Protein (specify units) 7-8oz   Fiber 30 grams   Whole Grain Foods 3 servings   Saturated Fats 13 max. grams   Fruits and Vegetables 5 servings/day   Sodium 1500 grams   Personal Nutrition Goals   Personal Goal #1 Drink more water each day -- at least 64 oz fluids daily   Personal Goal #2 Work to include fruits and vegetables on a more consistent basis   Personal Goal #3 Choose lean meats -- tuKuwaitersions of sausage, bacon, etc.   Comments Patient states he eats foods according to his taste any any particular time; for example, he might eat bananas 2-3 at a time for several days or weeks and then not eat any for 1-2 months.    Intervention Plan   Intervention Nutrition handout(s) given to patient.;Prescribe, educate and counsel regarding individualized specific dietary modifications aiming towards targeted core components such as weight, hypertension, lipid management, diabetes, heart failure and other comorbidities.   Expected Outcomes Short Term Goal: A plan has been developed with personal nutrition goals set during dietitian appointment.       Nutrition Discharge: Rate Your Plate Scores:     Nutrition Assessments - 11/07/15  1744    Rate Your Plate Scores   Post Score 65   Post Score % 72 %      Nutrition Goals Re-Evaluation:   Psychosocial: Target Goals: Acknowledge presence or absence of depression, maximize coping skills, provide positive support system. Participant is able to verbalize types and ability to use techniques and skills needed for reducing stress and depression.  Initial Review & Psychosocial Screening:     Initial Psych Review & Screening - 07/31/15 1817    Initial Review   Current issues with Current Depression;Current Sleep Concerns;Current Stress Concerns   Comments Jonathon Hua informed nurse that he has been having anxiety issues (had bypass surgery in November 2016). Jonathon Newman described a couple of episodes where he became really anxious and on edge and he needed to be alone during those times; Jonathon Newman's mother passed away 3 weeks ago;    Family Dynamics   Good Support System? No   Comments reports having a close friend.  Jonathon Newman's mother passed away 3 weeks ago; has 2 grown children and 8 grandkids and will soon have a great grandchild.  Jonathon Newman states he has never been on any medication for anxiety or depression.  In addition, prior to his heart bypass, Jonathon Newman was exercising 3 days a week for 2 hours prior to having heart  bypass.  Jonathon Newman states he has gained weight since his bypass surgery.     Barriers   Psychosocial barriers to participate in program The patient should benefit from training in stress management and relaxation.   Screening Interventions   Interventions Program counselor consult;Encouraged to exercise      Quality of Life Scores:     Quality of Life - 11/07/15 1735    Quality of Life Scores   Health/Function Post 20.93 %   Socioeconomic Post 23.06 %   Psych/Spiritual Post 16.86 %   Family Post 21.2 %   GLOBAL Post 20.64 %      PHQ-9:     Recent Review Flowsheet Data     Depression screen [facility] 2/9 11/07/2015 07/31/2015   Decreased Interest 0 2   Down, Depressed, Hopeless 1 2   PHQ - 2 Score 1 4   Altered sleeping 2 2   Tired, decreased energy 3 1   Change in appetite 0 0   Feeling bad or failure about yourself  0 1   Trouble concentrating 0 0   Moving slowly or fidgety/restless 0 0   Suicidal thoughts 0 0   PHQ-9 Score 6 8   Difficult doing work/chores Not difficult at all Not difficult at all      Psychosocial Evaluation and Intervention:     Psychosocial Evaluation - 11/18/15 1737    Discharge Psychosocial Assessment & Intervention   Comments Final meeting with Jonathon Newman today as his last day is later this week.  He reports having benefitted from this program and feels more stamina since coming consistently.  He has gone back to work and is managing that well.  He reports continuing to sleep well with some help occasionally from Benadryl or a mild OTC sleep aid.  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  He has not yet connected with a counselor to help with this and was going to speak with his doctor about this soon.  Jonathon Newman plans to return to working out consistently at the gym early in the  morning and hopes to lose some of the weight he has gained since the surgery.  Counselor commended Jonathon Newman on all his hard work and encouraged him to continue positive self care and healthy decision making.        Psychosocial Re-Evaluation:     Psychosocial Re-Evaluation      08/26/15 1700 08/28/15 1738 10/07/15 1712       Psychosocial Re-Evaluation   Comments [facility] of breath since last night and couldn't lie flat. Pulse ox room air in Cardiac 97% Bl 168/98. No chest pain.  I left vm to follow up with patient today. [facility]  Follow up with Jonathon Newman today to  assess how he is doing since the passing of his mother in January.  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 Counselor encouraged Jonathon Newman to see a counselor to talk about his grief and loss and this conflict, and he stated [facility].  Jonathon Newman is also encouraged to engage in stress management activities to help with coping during this time.       Continued Psychosocial Services Needed   No        Vocational Rehabilitation: Provide vocational rehab assistance to qualifying candidates.   Vocational Rehab Evaluation & Intervention:     Vocational Rehab - 07/31/15 1803    Initial Vocational Rehab Evaluation & Intervention   Assessment shows need for Vocational Rehabilitation No      Education: Education Goals: Education classes will be provided on a weekly basis, covering required topics. Participant will state understanding/return demonstration of topics presented.  Learning Barriers/Preferences:     Learning Barriers/Preferences - 07/31/15 1800    Learning Barriers/Preferences   Learning Barriers None   Learning Preferences Written Material;Video;Group Instruction      Education Topics: General Nutrition Guidelines/Fats and Fiber: -Group instruction provided by verbal, written material, models and posters to present the general guidelines for heart healthy nutrition. Gives an explanation and review of dietary fats and fiber.          [facility]   Date  09/16/15   Educator  PI   Instruction Review Code  2- meets goals/outcomes      Controlling Sodium/Reading Food Labels: -Group verbal and written material supporting the discussion of sodium use in heart healthy nutrition. Review and explanation with models, verbal and written materials for utilization of the food label.   Exercise Physiology  & Risk Factors: - Group verbal and written instruction with models to review the exercise physiology of the cardiovascular system and associated critical values. Details cardiovascular disease risk factors and the goals associated with each risk factor.      [facility]   Date  08/12/15   Educator  sw   Instruction Review Code  2- meets goals/outcomes      Aerobic Exercise & Resistance Training: - Gives group verbal and written discussion on the health impact of inactivity. On the components of aerobic and resistive training programs and the benefits of this training and how to safely progress through these programs.      [facility]   Date  08/14/15  Educator  B. Sickles   Instruction Review Code  2- meets goals/outcomes      Flexibility, Balance, General Exercise Guidelines: - Provides group verbal and written instruction on the benefits of flexibility and balance training programs. Provides general exercise guidelines with specific guidelines to those with heart or lung disease. Demonstration and skill practice provided.   Stress Management: - Provides group verbal and written instruction about the health risks of elevated stress, cause of high stress, and healthy ways to reduce stress.      Cardiac Rehab from 09/18/2015 in [facility] Cardiac and Pulmonary Rehab   Date  08/21/15   Educator  Belva Crome, [facility]   Instruction Review Code  2- meets goals/outcomes      Depression: - Provides group verbal and written instruction on the correlation between heart/lung disease and depressed mood, treatment options, and the stigmas associated with seeking treatment.      Cardiac Rehab from 09/18/2015 in [facility] Cardiac and Pulmonary Rehab   Date  09/18/15   Educator  Belva Crome, [facility]   Instruction Review Code  2- meets goals/outcomes      Anatomy & Physiology of the Heart: - Group verbal and  written instruction and models provide basic cardiac anatomy and physiology, with the coronary electrical and arterial systems. Review of: AMI, Angina, Valve disease, Heart Failure, Cardiac Arrhythmia, Pacemakers, and the ICD.   Cardiac Procedures: - Group verbal and written instruction and models to describe the testing methods done to diagnose heart disease. Reviews the outcomes of the test results. Describes the treatment choices: Medical Management, Angioplasty, or Coronary Bypass Surgery.      Cardiac Rehab from 09/18/2015 in [facility] Cardiac and Pulmonary Rehab   Date  09/02/15   Educator  CE   Instruction Review Code  2- meets goals/outcomes      Cardiac Medications: - Group verbal and written instruction to review commonly prescribed medications for heart disease. Reviews the medication, class of the drug, and side effects. Includes the steps to properly store meds and maintain the prescription regimen.   Go Sex-Intimacy & Heart Disease, Get SMART - Goal Setting: - Group verbal and written instruction through game format to discuss heart disease and the return to sexual intimacy. Provides group verbal and written material to discuss and apply goal setting through the application of the S.M.A.R.T. Method.      Cardiac Rehab from 09/18/2015 in [facility] Cardiac and Pulmonary Rehab   Date  09/02/15   Educator  CE   Instruction Review Code  2- meets goals/outcomes      Other Matters of the Heart: - Provides group verbal, written materials and models to describe Heart Failure, Angina, Valve Disease, and Diabetes in the realm of heart disease. Includes description of the disease process and treatment options available to the cardiac patient.   Exercise & Equipment Safety: - Individual verbal instruction and demonstration of equipment use and safety with use of the equipment.      Cardiac Rehab from 09/18/2015 in [facility] Cardiac and Pulmonary Rehab   Date  07/31/15   Educator  D. Delford Field, RN    Instruction Review Code  1- partially meets, needs review/practice      Infection Prevention: - Provides verbal and written material to individual with discussion of infection control including proper hand washing and proper equipment cleaning during exercise session.      Cardiac Rehab from 09/18/2015 in [facility] Cardiac and Pulmonary Rehab   Date  07/31/15   Educator  Loanne Drilling, RN   Instruction Review Code  2- meets goals/outcomes      Falls Prevention: - Provides verbal and written material to individual with discussion of falls prevention and safety.      Cardiac Rehab from 09/18/2015 in [facility] Cardiac and Pulmonary Rehab   Date  07/31/15   Educator  D. Delford Field, RN   Instruction Review Code  2- meets goals/outcomes      Diabetes: - Individual verbal and written instruction to review signs/symptoms of diabetes, desired ranges of glucose level fasting, after meals and with exercise. Advice that pre and post exercise glucose checks will be done for 3 sessions at entry of program.    Knowledge Questionnaire Score:     Knowledge Questionnaire Score - 11/07/15 1745    Knowledge Questionnaire Score   Post Score 24      Core Components/Risk Factors/Patient Goals at Admission:     Personal Goals and Risk Factors at Admission - 07/31/15 1815    Core Components/Risk Factors/Patient Goals on Admission    Weight Management Yes   Intervention (read-only) Learn and follow the exercise and diet guidelines while in the program. Utilize the nutrition and education classes to help gain knowledge of the diet and exercise expectations in the program   Admit Weight 205 lb (92.987 kg)   Goal Weight: Short Term 165 lb (74.844 kg)   Sedentary Yes   Intervention (read-only) While in program, learn and follow the exercise prescription taught. Start at a low level workload and increase workload after able to maintain previous level for 30 minutes. Increase time before increasing intensity.   Diabetes No    Hypertension Yes   Goal Participant will see blood pressure controlled within the values of 140/13mm/Hg or within value directed by their physician.   Intervention (read-only) Provide nutrition & aerobic exercise along with prescribed medications to achieve BP 140/90 or less.   Lipids Yes   Goal Cholesterol controlled with medications as prescribed, with individualized exercise RX and with personalized nutrition plan. Value goals: LDL < , HDL > . Participant states understanding of desired cholesterol values and following prescriptions.   Intervention (read-only) Provide nutrition & aerobic exercise along with prescribed medications to achieve LDL 70mg , HDL >40mg .   Stress Yes   Goal To meet with psychosocial counselor for stress and relaxation information and guidance. To state understanding of performing relaxation techniques and or identifying personal stressors.   Intervention (read-only) Provide education on types of stress, identifiying stressors, and ways to cope with stress. Provide demonstration and active practice of relaxation techniques.   Take Less Medication Yes   Intervention Learn your risk factors and begin the lifestyle modifications for risk factor control during your time in the program.      Core Components/Risk Factors/Patient Goals Review:      Goals and Risk Factor Review      08/26/15 1659 08/28/15 1736 09/18/15 1737 11/03/15 1116     Core Components/Risk Factors/Patient Goals Review   Personal Goals Review Sedentary;Hypertension Sedentary;Hypertension Hypertension Hypertension    Review Jonathon Newman taken to [facility] Dept for c/o shortness of breath since last night and couldn't lie flat. Pulse ox room air in Cardiac 97% Bl 168/98. No chest pain.  Per Jonathon Newman Emerg Dept note 08/27/2015:Pertinent labs & imaging results that were available during my care of the patient were reviewed by me and considered in my medical decision making (see chart for details). Jonathon Newman's BP  upon seeing the cardiologist at the [facility]  on Friday, March 31st was 160/110.  Coreg dose doubled to 12.50 orally twice a day.  Today's BP upon check in was 132/74.  Jonathon Newman states he could tell his BP has been elevated at times as he feels like his head was going to explode.  He would have it checked somewhere and  the diastolic would be 200.  He stated he has not had these episodes since starting Coreg 12.50 mg twice a day.  Exit BP today was 136/74.   Kaiyden's BP numbers are improving. Entry [phone]  Post exercise 116/74-132/82.    Expected Outcomes Controlled blood pressure and no SOB Decreased SOB and no chest pain. Sajid will see BP controlled within the values of 140/90 mmg Hg or within detected value directed by the physician.   Dantrell will see BP controlled within the values of 140/90 mmg Hg or within detected value directed by the physician.         Core Components/Risk Factors/Patient Goals at Discharge (Final Review):      Goals and Risk Factor Review - 11/03/15 1116    Core Components/Risk Factors/Patient Goals Review   Personal Goals Review Hypertension   Review Rudransh's BP numbers are improving. Entry [phone]  Post exercise 116/74-132/82.   Expected Outcomes Emileo will see BP controlled within the values of 140/90 mmg Hg or within detected value directed by the physician.        ITP Comments:     ITP Comments      08/06/15 0731 08/19/15 1356 08/26/15 1658 08/28/15 1737 09/05/15 1535   ITP Comments 30 day review.  Continue with ITP  new to program, to this date he has completed orientation and attended one session.  Tanveer is experiencing mild to severe pain related to the surgery. He will approach the exercises with caution and will not use weignts until he sees some relief with the muscle and chest wall pain.  Raeden left a vm today that he will not be able to attend Cardiac Rehab this evening but hopes to return on Wednesday.  Sanchez taken to [facility] Emerg Dept for c/o shortness of  breath since last night and couldn't lie flat. Pulse ox room air in Cardiac 97% Bl 168/98. No chest pain.  Wallice was seen in [facility] Emerg Dept yesterday for SOB all day not able to lie flat. Not acute SOB in Cardiac Rehab but did not have him exercise on 08/27/2015 but taken to Emerg Dept to be evaluated. Discharged to home-see EPIC note.  30 Day Review. Continue with the ITP.     10/06/15 1035 10/29/15 1041 11/03/15 1115       ITP Comments 30 Day review. Continue with ITP Romelo called and said he could attend yesterday due to being busy at work but wishes he could attend Cardiac Rehab.  30 day review. Continue with ITP        Comments:  Kyl completed Cardiac Rehab program today.  He plans to continue exercising at a [employer].

## 2015-11-21 NOTE — Progress Notes (Signed)
Daily Session Note  Patient Details  Name: Jonathon Newman MRN: [id number] Date of Birth: 03-17-1956 Referring Provider:    Encounter Date: 11/21/2015  Check In:     Session Check In - 11/21/15 1703    Check-In   Location [facility]-Cardiac & Pulmonary Rehab   Staff Present Gerlene Burdock, RN, BSN;Diane Joya Gaskins, RN, Vickki Hearing, BA, ACSM CEP, Exercise Physiologist   Supervising physician immediately available to respond to emergencies See telemetry face sheet for immediately available ER MD   Medication changes reported     No   Fall or balance concerns reported    No   Warm-up and Cool-down Performed on first and last piece of equipment   Resistance Training Performed Yes   VAD Patient? No   Pain Assessment   Currently in Pain? No/denies         Goals Met:  Proper associated with RPD/PD & O2 Sat Exercise tolerated well  Goals Unmet:  Not Applicable  Comments:     Dr. Emily Filbert is Medical Director for [facility].

## 2015-11-21 NOTE — Progress Notes (Signed)
Discharge Summary  Patient Details  Name: Jonathon Newman MRN: [id number] Date of Birth: 12/07/1955 Referring Provider:     Number of Visits: 36  Reason for Discharge:  Patient reached a stable level of exercise. Patient independent in their exercise.  Smoking History:  History  Smoking status  . Former Smoker  Smokeless tobacco  . Not on file    Diagnosis:  S/P CABG x 2  ADL UCSD:   Initial Exercise Prescription:     Initial Exercise Prescription - 07/31/15 1500    Date of Initial Exercise RX and Referring Provider   Date 07/31/15   Treadmill   MPH 2.8   Grade 0   Minutes 10   Bike   Level 0.4   Watts 15   Minutes 15   Recumbant Bike   Level 4   RPM 40   Watts 35   Minutes 15   NuStep   Level 3   Watts 40   Minutes 15   Arm Ergometer   Level 1   Watts 8   Minutes 10   Arm/Foot Ergometer   Level 4   Watts 12   Minutes 10   Cybex   Level 3   RPM 50   Minutes 15   Recumbant Elliptical   Level 2   RPM 40   Watts 15   Minutes 15   Elliptical   Level 1   Speed 3   Minutes 1   REL-XR   Level 3   Watts 45   Minutes 15   T5 Nustep   Level 2   Watts 20   Minutes 15   Biostep-RELP   Level 3   Watts 20   Minutes 15   Prescription Details   Frequency (times per week) 3   Duration Progress to 30 minutes of continuous aerobic without signs/symptoms of physical distress   Intensity   THRR REST +  30   Ratings of Perceived Exertion 11-15   Progression   Progression Continue progressive overload as per policy without signs/symptoms or physical distress.   Resistance Training   Training Prescription Yes   Weight 3   Reps 10-15      Discharge Exercise Prescription (Final Exercise Prescription Changes):     Exercise Prescription Changes - 11/07/15 1600    Home Exercise Plan   [facility]      Functional Capacity:     6 Minute Walk      07/31/15 1440 11/07/15 1641 11/07/15 1654   6  Minute Walk   Phase Initial Discharge Discharge   Distance 1650 feet 2000 feet    Distance % Change   21 %   Walk Time 6 minutes 6 minutes    # of Rest Breaks 0     MPH 3     RPE 12 14    Symptoms No     Resting HR 84 bpm 78 bpm    Resting BP 136/86 mmHg 124/62 mmHg    Max Ex. HR 120 bpm 116 bpm    Max Ex. BP 164/90 mmHg 170/80 mmHg    Interval Oxygen   Interval Oxygen?  Yes    Baseline Oxygen Saturation %  98 %    6 Minute Oxygen Saturation %  97 %       Psychological, QOL, Others - Outcomes: PHQ 2/9: [facility] 11/07/2015 07/31/2015  Decreased Interest 0 2  Down, Depressed, Hopeless 1  2  PHQ - 2 Score 1 4  Altered sleeping 2 2  Tired, decreased energy 3 1  Change in appetite 0 0  Feeling bad or failure about yourself  0 1  Trouble concentrating 0 0  Moving slowly or fidgety/restless 0 0  Suicidal thoughts 0 0  PHQ-9 Score 6 8  Difficult doing work/chores Not difficult at all Not difficult at all    Quality of Life:     Quality of Life - 11/07/15 1735    Quality of Life Scores   Health/Function Post 20.93 %   Socioeconomic Post 23.06 %   Psych/Spiritual Post 16.86 %   Family Post 21.2 %   GLOBAL Post 20.64 %      Personal Goals: Goals established at orientation with interventions provided to work toward goal.     Personal Goals and Risk Factors at Admission - 07/31/15 1815    Core Components/Risk Factors/Patient Goals on Admission    Weight Management Yes   Intervention (read-only) Learn and follow the exercise and diet guidelines while in the program. Utilize the nutrition and education classes to help gain knowledge of the diet and exercise expectations in the program   Admit Weight 205 lb (92.987 kg)   Goal Weight: Short Term 165 lb (74.844 kg)   Sedentary Yes   Intervention (read-only) While in program, learn and follow the exercise prescription taught. Start at a low level workload and increase workload after able to maintain previous  level for 30 minutes. Increase time before increasing intensity.   Diabetes No   Hypertension Yes   Goal Participant will see blood pressure controlled within the values of 140/67mm/Hg or within value directed by their physician.   Intervention (read-only) Provide nutrition & aerobic exercise along with prescribed medications to achieve BP 140/90 or less.   Lipids Yes   Goal Cholesterol controlled with medications as prescribed, with individualized exercise RX and with personalized nutrition plan. Value goals: LDL < 70mg , HDL > 40mg . Participant states understanding of desired cholesterol values and following prescriptions.   Intervention (read-only) Provide nutrition & aerobic exercise along with prescribed medications to achieve LDL 70mg , HDL >40mg .   Stress Yes   Goal To meet with psychosocial counselor for stress and relaxation information and guidance. To state understanding of performing relaxation techniques and or identifying personal stressors.   Intervention (read-only) Provide education on types of stress, identifiying stressors, and ways to cope with stress. Provide demonstration and active practice of relaxation techniques.   Take Less Medication Yes   Intervention Learn your risk factors and begin the lifestyle modifications for risk factor control during your time in the program.       Personal Goals Discharge:     Goals and Risk Factor Review      08/26/15 1659 08/28/15 1736 09/18/15 1737 11/03/15 1116     Core Components/Risk Factors/Patient Goals Review   Personal Goals Review Sedentary;Hypertension Sedentary;Hypertension Hypertension Hypertension    Review Jonathon Newman taken to [facility] Dept for c/o shortness of breath since last night and couldn't lie flat. Pulse ox room air in Cardiac 97% Bl 168/98. No chest pain.  Per [facility] Emerg Dept note 08/27/2015:Pertinent labs & imaging results that were available during my care of the patient were reviewed by me and considered in my  medical decision making (see chart for details). Jonathon Newman's BP upon seeing the cardiologist at the [facility] on Friday, March 31st was 160/110.  Coreg dose doubled to 12.50 orally twice a day.  Today's BP upon check in was 132/74.  Jonathon Newman states he could tell his BP has been elevated at times as he feels like his head was going to explode.  He would have it checked somewhere and  the diastolic would be 200.  He stated he has not had these episodes since starting Coreg 12.50 mg twice a day.  Exit BP today was 136/74.   Jonathon Newman's BP numbers are improving. Entry [phone]  Post exercise 116/74-132/82.    Expected Outcomes Controlled blood pressure and no SOB Decreased SOB and no chest pain. Jonathon Newman will see BP controlled within the values of 140/90 mmg Hg or within detected value directed by the physician.   Jonathon Newman will see BP controlled within the values of 140/90 mmg Hg or within detected value directed by the physician.         Nutrition & Weight - Outcomes:     Pre Biometrics - 07/31/15 1439    Pre Biometrics   Height  (1.702 m)   Weight 205 lb (92.987 kg)   Waist Circumference 40.5 inches   Hip Circumference 40.5 inches   Waist to Hip Ratio 1 %   BMI (Calculated) 32.2         Post Biometrics - 11/07/15 1643     Post  Biometrics   Height  (1.702 m)   Weight 214 lb 8 oz (97.297 kg)   Waist Circumference 43 inches   Hip Circumference 43 inches   Waist to Hip Ratio 1 %   BMI (Calculated) 33.7      Nutrition:     Nutrition Therapy & Goals - 08/14/15 1611    Nutrition Therapy   Diet 1700kcal DASH diet    Drug/Food Interactions Statins/Certain Fruits   Protein (specify units) 7-8oz   Fiber 30 grams   Whole Grain Foods 3 servings   Saturated Fats 13 max. grams   Fruits and Vegetables 5 servings/day   Sodium 1500 grams   Personal Nutrition Goals   Personal Goal #1 Drink more water each day -- at least 64 oz fluids daily   Personal Goal #2 Work to include fruits and vegetables on  a more consistent basis   Personal Goal #3 Choose lean meats -- [place] versions of sausage, bacon, etc.   Comments Patient states he eats foods according to his taste any any particular time; for example, he might eat bananas 2-3 at a time for several days or weeks and then not eat any for 1-2 months.    Intervention Plan   Intervention Nutrition handout(s) given to patient.;Prescribe, educate and counsel regarding individualized specific dietary modifications aiming towards targeted core components such as weight, hypertension, lipid management, diabetes, heart failure and other comorbidities.   Expected Outcomes Short Term Goal: A plan has been developed with personal nutrition goals set during dietitian appointment.      Nutrition Discharge:     Nutrition Assessments - 11/07/15 1744    Rate Your Plate Scores   Post Score 65   Post Score % 72 %      Education Questionnaire Score:     Knowledge Questionnaire Score - 11/07/15 1745    Knowledge Questionnaire Score   Post Score 24     Kolston completed Cardiac Rehab program today.  He plans to continue exercising at a [employer].   Goals reviewed with patient; copy given to patient.

## 2015-12-04 ENCOUNTER — Encounter: Payer: Self-pay | Admitting: *Deleted

## 2015-12-04 DIAGNOSIS — Z951 Presence of aortocoronary bypass graft: Secondary | ICD-10-CM

## 2015-12-04 NOTE — Progress Notes (Signed)
Cardiac Individual Treatment Plan  Patient Details  Name: Jonathon Newman MRN: [id number] Date of Birth: [birth year] Referring Provider:    Initial Encounter Date:       Cardiac Rehab from 07/31/2015 in [facility] Cardiac and Pulmonary Rehab   Date  07/31/15      Visit Diagnosis: S/P CABG x 2  Patient's Home Medications on Admission:  Current outpatient prescriptions:  .  aspirin 81 MG tablet, Take 81 mg by mouth daily., Disp: , Rfl:  .  atorvastatin (LIPITOR) 80 MG tablet, Take 80 mg by mouth daily., Disp: , Rfl:  .  carvedilol (COREG) 6.25 MG tablet, Take 6.25 mg by mouth 2 (two) times daily with a meal., Disp: , Rfl:  .  cyclobenzaprine (FLEXERIL) 10 MG tablet, Take 10 mg by mouth 3 (three) times daily as needed for muscle spasms., Disp: , Rfl:  .  docusate sodium (COLACE) 100 MG capsule, Take 100 mg by mouth 2 (two) times daily., Disp: , Rfl:  .  furosemide (LASIX) 20 MG tablet, Take 20 mg by mouth 3 (three) times daily., Disp: , Rfl:  .  gabapentin (NEURONTIN) 300 MG capsule, Take 300 mg by mouth 3 (three) times daily., Disp: , Rfl:  .  HYDROcodone-acetaminophen (NORCO) 10-325 MG tablet, Take 1 tablet by mouth every 6 (six) hours as needed., Disp: , Rfl:  .  hydrOXYzine (ATARAX/VISTARIL) 25 MG tablet, Take 25 mg by mouth 3 (three) times daily as needed., Disp: , Rfl:  .  losartan (COZAAR) 50 MG tablet, Take 50 mg by mouth daily., Disp: , Rfl:  .  pantoprazole (PROTONIX) 20 MG tablet, Take 20 mg by mouth daily., Disp: , Rfl:  .  predniSONE (DELTASONE) 20 MG tablet, Take 2 tablets (40 mg total) by mouth daily., Disp: 8 tablet, Rfl: 0  Past Medical History: Past Medical History  Diagnosis Date  . Hypertension   . Hypercholesteremia   . S/P coronary artery bypass graft x 2     Tobacco Use: History  Smoking status  . Former Smoker  Smokeless tobacco  . Not on file    Labs: Recent Review Flowsheet Data    There is no flowsheet data to display.       Exercise Target Goals:     Exercise Program Goal: Individual exercise prescription set with THRR, safety & activity barriers. Participant demonstrates ability to understand and report RPE using BORG scale, to self-measure pulse accurately, and to acknowledge the importance of the exercise prescription.  Exercise Prescription Goal: Starting with aerobic activity 30 plus minutes a day, 3 days per week for initial exercise prescription. Provide home exercise prescription and guidelines that participant acknowledges understanding prior to discharge.  Activity Barriers & Risk Stratification:     Activity Barriers & Cardiac Risk Stratification - 07/31/15 1756    Activity Barriers & Cardiac Risk Stratification   Activity Barriers Arthritis;Back Problems;Joint Problems;Neck/Spine Problems  Jonathon Newman has herniated discs at L3-5; s/p neck surgery for herniated disc in neck; right anterior lateral thigh numbness since CABG; Scheduled for MRI of back in next couple weeks to evaluate if right thigh pain/numbness coming from disc problems    Cardiac Risk Stratification High      6 Minute Walk:     6 Minute Walk      07/31/15 1440 11/07/15 1641 11/07/15 1654   6 Minute Walk   Phase Initial Discharge Discharge   Distance 1650 feet 2000 feet    Distance % Change   21 %  Walk Time 6 minutes 6 minutes    # of Rest Breaks 0     MPH 3     RPE 12 14    Symptoms No     Resting HR 84 bpm 78 bpm    Resting BP 136/86 mmHg 124/62 mmHg    Max Ex. HR 120 bpm 116 bpm    Max Ex. BP 164/90 mmHg 170/80 mmHg    Interval Oxygen   Interval Oxygen?  Yes    Baseline Oxygen Saturation %  98 %    6 Minute Oxygen Saturation %  97 %       Initial Exercise Prescription:     Initial Exercise Prescription - 07/31/15 1500    Date of Initial Exercise RX and Referring Provider   Date 07/31/15   Treadmill   MPH 2.8   Grade 0   Minutes 10   Bike   Level 0.4   Watts 15   Minutes 15   Recumbant Bike   Level 4   RPM 40   Watts 35    Minutes 15   NuStep   Level 3   Watts 40   Minutes 15   Arm Ergometer   Level 1   Watts 8   Minutes 10   Arm/Foot Ergometer   Level 4   Watts 12   Minutes 10   Cybex   Level 3   RPM 50   Minutes 15   Recumbant Elliptical   Level 2   RPM 40   Watts 15   Minutes 15   Elliptical   Level 1   Speed 3   Minutes 1   REL-XR   Level 3   Watts 45   Minutes 15   T5 Nustep   Level 2   Watts 20   Minutes 15   Biostep-RELP   Level 3   Watts 20   Minutes 15   Prescription Details   Frequency (times per week) 3   Duration Progress to 30 minutes of continuous aerobic without signs/symptoms of physical distress   Intensity   THRR REST +  30   Ratings of Perceived Exertion 11-15   Progression   Progression Continue progressive overload as per policy without signs/symptoms or physical distress.   Resistance Training   Training Prescription Yes   Weight 3   Reps 10-15      Perform Capillary Blood Glucose checks as needed.  Exercise Prescription Changes:     Exercise Prescription Changes      08/05/15 1700 08/14/15 1600 08/21/15 1108 09/18/15 1600 09/30/15 1348   Exercise Review   Progression   Yes Yes Yes   Response to Exercise   Blood Pressure (Admit)   138/82 mmHg  142/78 mmHg   Blood Pressure (Exercise)   168/80 mmHg  174/80 mmHg   Blood Pressure (Exit)   150/88 mmHg  126/84 mmHg   Heart Rate (Admit)   77 bpm  92 bpm   Heart Rate (Exercise)   113 bpm  101 bpm   Heart Rate (Exit)   84 bpm  81 bpm   Rating of Perceived Exertion (Exercise)   12  11   Symptoms   None  None   Comments Patient attended first day of cardiac rehabilitation. Following exercise on treadmill, patient stated felt dizzy.  BP taken in sitting with reading of 128/78.  Given water with dizziness abating.  Resumed exercise on another piece of equipment.  No other adverse  effects noted.  Reviewed individualized exercise prescription and made increases per departmental policy. Exercise increases  were discussed with the patient and they were able to perform the new work loads without issue (no signs or symptoms).  Reviewed individualized exercise prescription and made increases per departmental policy. Exercise increases were discussed with the patient and they were able to perform the new work loads without issue (no signs or symptoms).     Duration   Progress to 45 minutes of aerobic exercise without signs/symptoms of physical distress Progress to 45 minutes of aerobic exercise without signs/symptoms of physical distress Progress to 50 minutes of aerobic without signs/symptoms of physical distress   Intensity   Rest + 30 Rest + 30 Rest + 30   Progression   Progression   Continue progressive overload as per policy without signs/symptoms or physical distress. Continue progressive overload as per policy without signs/symptoms or physical distress. Continue to progress workloads to maintain intensity without signs/symptoms of physical distress.   Resistance Training   Training Prescription  Yes Yes Yes Yes   Weight  '5 5 5 5   ' Reps  10-15 10-15 10-15 10-15   Interval Training   Interval Training     Yes   Equipment     REL-XR   Comments     L8-L10   Treadmill   MPH  2.8 2.8 2.3 3   Grade  0 0 0 3.5   Minutes  '15 15 15 15   ' REL-XR   Level  '5 6 6 10   ' Watts  45 90 90 120   Minutes  '20 20 20    ' Home Exercise Plan   Plans to continue exercise at     [facility] (comment)     10/10/15 1700 10/17/15 1700 10/29/15 1500 11/07/15 1600     Exercise Review   Progression Yes Yes Yes     Response to Exercise   Blood Pressure (Admit)   128/84 mmHg     Blood Pressure (Exercise)   168/60 mmHg     Blood Pressure (Exit)   116/74 mmHg     Heart Rate (Admit)   79 bpm     Heart Rate (Exercise)   108 bpm     Heart Rate (Exit)   80 bpm     Rating of Perceived Exertion (Exercise)   13     Symptoms   None     Duration Progress to 50 minutes of aerobic without signs/symptoms of physical  distress Progress to 50 minutes of aerobic without signs/symptoms of physical distress Progress to 50 minutes of aerobic without signs/symptoms of physical distress     Intensity Rest + 30 Rest + 30 THRR New  40-80% THRR 65-130 bpm     Progression   Progression Continue to progress workloads to maintain intensity without signs/symptoms of physical distress. Continue to progress workloads to maintain intensity without signs/symptoms of physical distress. Continue to progress workloads to maintain intensity without signs/symptoms of physical distress.     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  He continues to voice discomfort which is localized to the  left breast area, stating his cardiologist relates it to residual healing from surgery.  Plan is to continue to progress with program and assist with transition to community exercise upon completion of Heart Track. Mclane continues to attend Heart Track and is progressing without difficulty.         Discharge Exercise Prescription (Final Exercise Prescription Changes):     Exercise Prescription Changes - 11/07/15 1600    Home Exercise Plan   Plans to continue exercise at [facility] (comment)      Nutrition:  Target Goals: Understanding of nutrition guidelines,  daily intake of sodium <1550m, cholesterol <2038m calories 30% from fat and 7% or less from saturated fats, daily to have 5 or more servings of fruits and vegetables.  Biometrics:     Pre Biometrics - 07/31/15 1439    Pre Biometrics   Height '5\' 7"'  (1.702 m)   Weight 205 lb (92.987 kg)   Waist Circumference 40.5 inches   Hip Circumference 40.5 inches   Waist to Hip Ratio 1 %   BMI (Calculated) 32.2         Post Biometrics - 11/07/15 1643     Post  Biometrics   Height '5\' 7"'  (1.702 m)   Weight 214 lb 8 oz (97.297 kg)   Waist Circumference 43 inches   Hip Circumference 43 inches   Waist to Hip Ratio 1 %   BMI (Calculated) 33.7      Nutrition Therapy Plan and Nutrition Goals:     Nutrition Therapy & Goals - 08/14/15 1611    Nutrition Therapy   Diet 1700kcal DASH diet    Drug/Food Interactions Statins/Certain Fruits   Protein (specify units) 7-8oz   Fiber 30 grams   Whole Grain Foods 3 servings   Saturated Fats 13 max. grams   Fruits and Vegetables 5 servings/day   Sodium 1500 grams   Personal Nutrition Goals   Personal Goal #1 Drink more water each day -- at least 64 oz fluids daily   Personal Goal #2 Work to include fruits and vegetables on a more consistent basis   Personal Goal #3 Choose lean meats -- tuKuwaitersions of sausage, bacon, etc.   Comments Patient states he eats foods according to his taste any any particular time; for example, he might eat bananas 2-3 at a time for several days or weeks and then not eat any for 1-2 months.    Intervention Plan   Intervention Nutrition handout(s) given to patient.;Prescribe, educate and counsel regarding individualized specific dietary modifications aiming towards targeted core components such as weight, hypertension, lipid management, diabetes, heart failure and other comorbidities.   Expected Outcomes Short Term Goal: A plan has been developed with personal nutrition goals set during dietitian appointment.       Nutrition Discharge: Rate Your Plate Scores:     Nutrition Assessments - 11/07/15  1744    Rate Your Plate Scores   Post Score 65   Post Score % 72 %      Nutrition Goals Re-Evaluation:   Psychosocial: Target Goals: Acknowledge presence or absence of depression, maximize coping skills, provide positive support system. Participant is able to verbalize types and ability to use techniques and skills needed for reducing stress and depression.  Initial Review & Psychosocial Screening:     Initial Psych Review & Screening - 07/31/15 1817    Initial Review   Current issues with Current Depression;Current Sleep Concerns;Current Stress Concerns   Comments Onalee Hua informed nurse that he has been having anxiety issues (had bypass surgery in November 2016). Yasin described a couple of episodes where he became really anxious and on edge and he needed to be alone during those times; Laquincy's mother passed away 3 weeks ago;    Family Dynamics   Good Support System? No   Comments reports having a close friend.  Wylder's mother passed away 3 weeks ago; has 2 grown children and 8 grandkids and will soon have a great grandchild.  Eldo states he has never been on any medication for anxiety or depression.  In addition, prior to his heart bypass, Kashaun was exercising 3 days a week for 2 hours prior to having heart  bypass.  Deeric states he has gained weight since his bypass surgery.     Barriers   Psychosocial barriers to participate in program The patient should benefit from training in stress management and relaxation.   Screening Interventions   Interventions Program counselor consult;Encouraged to exercise      Quality of Life Scores:     Quality of Life - 11/07/15 1735    Quality of Life Scores   Health/Function Post 20.93 %   Socioeconomic Post 23.06 %   Psych/Spiritual Post 16.86 %   Family Post 21.2 %   GLOBAL Post 20.64 %      PHQ-9:     Recent Review Flowsheet Data     Depression screen [facility] 2/9 11/07/2015 07/31/2015   Decreased Interest 0 2   Down, Depressed, Hopeless 1 2   PHQ - 2 Score 1 4   Altered sleeping 2 2   Tired, decreased energy 3 1   Change in appetite 0 0   Feeling bad or failure about yourself  0 1   Trouble concentrating 0 0   Moving slowly or fidgety/restless 0 0   Suicidal thoughts 0 0   PHQ-9 Score 6 8   Difficult doing work/chores Not difficult at all Not difficult at all      Psychosocial Evaluation and Intervention:     Psychosocial Evaluation - 11/18/15 1737    Discharge Psychosocial Assessment & Intervention   Comments Final meeting with Mr. Nghiem today as his last day is later this week.  He reports having benefitted from this program and feels more stamina since coming consistently.  He has gone back to work and is managing that well.  He reports continuing to sleep well with some help occasionally from Benadryl or a mild OTC sleep aid.  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  He has not yet connected with a counselor to help with this and was going to speak with his doctor about this soon.  Mr. Bozard plans to return to working out consistently at the gym early in the  morning and hopes to lose some of the weight he has gained since the surgery.  Counselor commended Mr. Cornforth on all his hard work and encouraged him to continue positive self care and healthy decision making.        Psychosocial Re-Evaluation:     Psychosocial Re-Evaluation      08/26/15 1700 08/28/15 1738 10/07/15 1712       Psychosocial Re-Evaluation   Comments [facility] since last night and couldn't lie flat. Pulse ox room air in Cardiac 97% Bl 168/98. No chest pain.  I left vm to follow up with patient today. [facility]  Follow up with Mr. Gerri LinsVanwormer today to  assess how he is doing since the passing of his mother in January.  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 Counselor encouraged Mr. Mcneary to see a counselor to talk about his grief and loss and this conflict, and he stated [facility].  Mr. Gerri LinsVanwormer is also encouraged to engage in stress management activities to help with coping during this time.       Continued Psychosocial Services Needed   No        Vocational Rehabilitation: Provide vocational rehab assistance to qualifying candidates.   Vocational Rehab Evaluation & Intervention:     Vocational Rehab - 07/31/15 1803    Initial Vocational Rehab Evaluation & Intervention   Assessment shows need for Vocational Rehabilitation No      Education: Education Goals: Education classes will be provided on a weekly basis, covering required topics. Participant will state understanding/return demonstration of topics presented.  Learning Barriers/Preferences:     Learning Barriers/Preferences - 07/31/15 1800    Learning Barriers/Preferences   Learning Barriers None   Learning Preferences Written Material;Video;Group Instruction      Education Topics: General Nutrition Guidelines/Fats and Fiber: -Group instruction provided by verbal, written material, models and posters to present the general guidelines for heart healthy nutrition. Gives an explanation and review of dietary fats and fiber.          [facility]   Date  09/16/15   Educator  PI   Instruction Review Code  2- meets goals/outcomes      Controlling Sodium/Reading Food Labels: -Group verbal and written material supporting the discussion of sodium use in heart healthy nutrition. Review and explanation with models, verbal and written materials for utilization of the food label.   Exercise Physiology  & Risk Factors: - Group verbal and written instruction with models to review the exercise physiology of the cardiovascular system and associated critical values. Details cardiovascular disease risk factors and the goals associated with each risk factor.      [facility]   Date  08/12/15   Educator  sw   Instruction Review Code  2- meets goals/outcomes      Aerobic Exercise & Resistance Training: - Gives group verbal and written discussion on the health impact of inactivity. On the components of aerobic and resistive training programs and the benefits of this training and how to safely progress through these programs.      [facility]   Date  08/14/15  Educator  B. Sickles   Instruction Review Code  2- meets goals/outcomes      Flexibility, Balance, General Exercise Guidelines: - Provides group verbal and written instruction on the benefits of flexibility and balance training programs. Provides general exercise guidelines with specific guidelines to those with heart or lung disease. Demonstration and skill practice provided.   Stress Management: - Provides group verbal and written instruction about the health risks of elevated stress, cause of high stress, and healthy ways to reduce stress.      [facility]   Date  08/21/15   Educator  Belva CromeK. Clayton, [facility]   Instruction Review Code  2- meets goals/outcomes      Depression: - Provides group verbal and written instruction on the correlation between heart/lung disease and depressed mood, treatment options, and the stigmas associated with seeking treatment.      [facility]   Date  09/18/15   Educator  Belva CromeK. Clayton, [facility]   Instruction Review Code  2- meets goals/outcomes      Anatomy & Physiology of the Heart: - Group verbal and  written instruction and models provide basic cardiac anatomy and physiology, with the coronary electrical and arterial systems. Review of: AMI, Angina, Valve disease, Heart Failure, Cardiac Arrhythmia, Pacemakers, and the ICD.   Cardiac Procedures: - Group verbal and written instruction and models to describe the testing methods done to diagnose heart disease. Reviews the outcomes of the test results. Describes the treatment choices: Medical Management, Angioplasty, or Coronary Bypass Surgery.      [facility]   Date  09/02/15   Educator  CE   Instruction Review Code  2- meets goals/outcomes      Cardiac Medications: - Group verbal and written instruction to review commonly prescribed medications for heart disease. Reviews the medication, class of the drug, and side effects. Includes the steps to properly store meds and maintain the prescription regimen.   Go Sex-Intimacy & Heart Disease, Get SMART - Goal Setting: - Group verbal and written instruction through game format to discuss heart disease and the return to sexual intimacy. Provides group verbal and written material to discuss and apply goal setting through the application of the S.M.A.R.T. Method.      [facility]   Date  09/02/15   Educator  CE   Instruction Review Code  2- meets goals/outcomes      Other Matters of the Heart: - Provides group verbal, written materials and models to describe Heart Failure, Angina, Valve Disease, and Diabetes in the realm of heart disease. Includes description of the disease process and treatment options available to the cardiac patient.   Exercise & Equipment Safety: - Individual verbal instruction and demonstration of equipment use and safety with use of the equipment.      [facility]   Date  07/31/15   Educator  D. Delford FieldWright, RN    Instruction Review Code  1- partially meets, needs review/practice      Infection Prevention: - Provides verbal and written material to individual with discussion of infection control including proper hand washing and proper equipment cleaning during exercise session.      [facility]   Date  07/31/15   Educator  Loanne Drilling, RN   Instruction Review Code  2- meets goals/outcomes      Falls Prevention: - Provides verbal and written material to individual with discussion of falls prevention and safety.      Cardiac Rehab from 09/18/2015 in [facility] Cardiac and Pulmonary Rehab   Date  07/31/15   Educator  D. Delford Field, RN   Instruction Review Code  2- meets goals/outcomes      Diabetes: - Individual verbal and written instruction to review signs/symptoms of diabetes, desired ranges of glucose level fasting, after meals and with exercise. Advice that pre and post exercise glucose checks will be done for 3 sessions at entry of program.    Knowledge Questionnaire Score:     Knowledge Questionnaire Score - 11/07/15 1745    Knowledge Questionnaire Score   Post Score 24      Core Components/Risk Factors/Patient Goals at Admission:     Personal Goals and Risk Factors at Admission - 07/31/15 1815    Core Components/Risk Factors/Patient Goals on Admission    Weight Management Yes   Intervention (read-only) Learn and follow the exercise and diet guidelines while in the program. Utilize the nutrition and education classes to help gain knowledge of the diet and exercise expectations in the program   Admit Weight 205 lb (92.987 kg)   Goal Weight: Short Term 165 lb (74.844 kg)   Sedentary Yes   Intervention (read-only) While in program, learn and follow the exercise prescription taught. Start at a low level workload and increase workload after able to maintain previous level for 30 minutes. Increase time before increasing intensity.   Diabetes No    Hypertension Yes   Goal Participant will see blood pressure controlled within the values of 140/13mm/Hg or within value directed by their physician.   Intervention (read-only) Provide nutrition & aerobic exercise along with prescribed medications to achieve BP 140/90 or less.   Lipids Yes   Goal Cholesterol controlled with medications as prescribed, with individualized exercise RX and with personalized nutrition plan. Value goals: LDL < , HDL > . Participant states understanding of desired cholesterol values and following prescriptions.   Intervention (read-only) Provide nutrition & aerobic exercise along with prescribed medications to achieve LDL 70mg , HDL >40mg .   Stress Yes   Goal To meet with psychosocial counselor for stress and relaxation information and guidance. To state understanding of performing relaxation techniques and or identifying personal stressors.   Intervention (read-only) Provide education on types of stress, identifiying stressors, and ways to cope with stress. Provide demonstration and active practice of relaxation techniques.   Take Less Medication Yes   Intervention Learn your risk factors and begin the lifestyle modifications for risk factor control during your time in the program.      Core Components/Risk Factors/Patient Goals Review:      Goals and Risk Factor Review      08/26/15 1659 08/28/15 1736 09/18/15 1737 11/03/15 1116     Core Components/Risk Factors/Patient Goals Review   Personal Goals Review Sedentary;Hypertension Sedentary;Hypertension Hypertension Hypertension    Review Rishon taken to [facility] Dept for c/o shortness of breath since last night and couldn't lie flat. Pulse ox room air in Cardiac 97% Bl 168/98. No chest pain.  Per [facility] Emerg Dept note 08/27/2015:Pertinent labs & imaging results that were available during my care of the patient were reviewed by me and considered in my medical decision making (see chart for details). Helio's BP  upon seeing the cardiologist at the [facility]  on Friday, March 31st was 160/110.  Coreg dose doubled to 12.50 orally twice a day.  Today's BP upon check in was 132/74.  Deloris states he could tell his BP has been elevated at times as he feels like his head was going to explode.  He would have it checked somewhere and  the diastolic would be 200.  He stated he has not had these episodes since starting Coreg 12.50 mg twice a day.  Exit BP today was 136/74.   Ascher's BP numbers are improving. Entry [phone]  Post exercise 116/74-132/82.    Expected Outcomes Controlled blood pressure and no SOB Decreased SOB and no chest pain. Vitaliy will see BP controlled within the values of 140/90 mmg Hg or within detected value directed by the physician.   Sherman will see BP controlled within the values of 140/90 mmg Hg or within detected value directed by the physician.         Core Components/Risk Factors/Patient Goals at Discharge (Final Review):      Goals and Risk Factor Review - 11/03/15 1116    Core Components/Risk Factors/Patient Goals Review   Personal Goals Review Hypertension   Review Jaja's BP numbers are improving. Entry [phone]  Post exercise 116/74-132/82.   Expected Outcomes Nissan will see BP controlled within the values of 140/90 mmg Hg or within detected value directed by the physician.        ITP Comments:     ITP Comments      08/06/15 0731 08/19/15 1356 08/26/15 1658 08/28/15 1737 09/05/15 1535   ITP Comments 30 day review.  Continue with ITP  new to program, to this date he has completed orientation and attended one session.  Johnnie is experiencing mild to severe pain related to the surgery. He will approach the exercises with caution and will not use weignts until he sees some relief with the muscle and chest wall pain.  Josehua left a vm today that he will not be able to attend Cardiac Rehab this evening but hopes to return on Wednesday.  Peace taken to [facility] Emerg Dept for c/o shortness of  breath since last night and couldn't lie flat. Pulse ox room air in Cardiac 97% Bl 168/98. No chest pain.  Zakaria was seen in [facility] Emerg Dept yesterday for SOB all day not able to lie flat. Not acute SOB in Cardiac Rehab but did not have him exercise on 08/27/2015 but taken to Emerg Dept to be evaluated. Discharged to home-see EPIC note.  30 Day Review. Continue with the ITP.     10/06/15 1035 10/29/15 1041 11/03/15 1115 12/04/15 0910     ITP Comments 30 Day review. Continue with ITP Myles called and said he could attend yesterday due to being busy at work but wishes he could attend Cardiac Rehab.  30 day review. Continue with ITP 30 day review.  Discharged       Comments:

## 2016-05-05 ENCOUNTER — Other Ambulatory Visit: Payer: Self-pay | Admitting: Physician Assistant

## 2016-05-05 DIAGNOSIS — R42 Dizziness and giddiness: Secondary | ICD-10-CM

## 2016-05-19 ENCOUNTER — Ambulatory Visit
Admission: RE | Admit: 2016-05-19 | Discharge: 2016-05-19 | Disposition: A | Discharge status: Home / Self Care | Source: Ambulatory Visit | Attending: Physician Assistant | Admitting: Physician Assistant

## 2016-05-19 DIAGNOSIS — R42 Dizziness and giddiness: Secondary | ICD-10-CM | POA: Diagnosis not present

## 2016-05-19 LAB — POCT I-STAT CREATININE: CREATININE: 1 mg/dL (ref 0.61–1.24)

## 2016-05-19 MED ORDER — GADOBENATE DIMEGLUMINE 529 MG/ML IV SOLN
20.0000 mL | Freq: Once | INTRAVENOUS | Status: AC | PRN
  Administered 2016-05-19: 20 mL via INTRAVENOUS

## 2017-05-26 LAB — HM COLONOSCOPY

## 2017-06-29 ENCOUNTER — Encounter: Payer: Self-pay | Admitting: Cardiovascular Disease

## 2017-06-29 ENCOUNTER — Ambulatory Visit (INDEPENDENT_AMBULATORY_CARE_PROVIDER_SITE_OTHER): Admitting: Cardiovascular Disease

## 2017-06-29 VITALS — BP 130/80 | HR 61 | Ht 67.0 in | Wt 228.0 lb

## 2017-06-29 DIAGNOSIS — I10 Essential (primary) hypertension: Secondary | ICD-10-CM | POA: Diagnosis not present

## 2017-06-29 DIAGNOSIS — E785 Hyperlipidemia, unspecified: Secondary | ICD-10-CM | POA: Diagnosis not present

## 2017-06-29 DIAGNOSIS — R079 Chest pain, unspecified: Secondary | ICD-10-CM

## 2017-06-29 DIAGNOSIS — I25119 Atherosclerotic heart disease of native coronary artery with unspecified angina pectoris: Secondary | ICD-10-CM | POA: Diagnosis not present

## 2017-06-29 DIAGNOSIS — I509 Heart failure, unspecified: Secondary | ICD-10-CM | POA: Diagnosis not present

## 2017-06-29 NOTE — Progress Notes (Signed)
Cardiology Office Note   Date:  06/29/2017   ID:  Jonathon Newman, DOB 05/04/1956, MRN [id number]  PCP:  System, Pcp Not In  Cardiologist:   Lorine Bears, MD   Chief Complaint  Patient presents with  . other    Establish care for CAD; s/p CABG x 2 in 2016. Meds reviewed by the pt. verbally. Pt. c/o chest pain, jaw soreness, dizziness and numbness in hands.       History of Present Illness: Jonathon Newman is a 62 y.o. male who is self-referred to establish cardiovascular care.  He is retired from [employer] and used to get his care at the [place] but no longer eligible.  He has known history of coronary artery disease with previous Cypher drug-eluting stent placement to the RCA in 2010.  Patient subsequently had two-vessel coronary artery bypass graft in 2016 at [facility].  Other chronic medical conditions include hyperlipidemia, congestive heart failure with unknown ejection fraction and hypertension.  He quit smoking 15 years ago and does not drink alcohol. He reports resolution of chest tightness and discomfort after CABG but he noted recurrent symptoms about 2 months ago.  He describes substernal chest tightness that can happen at rest or with exertion.  He reports worsening exertional dyspnea with no orthopnea or PND.  He does complain of worsening leg edema with gradual weight gain.     Past Medical History:  Diagnosis Date  . Coronary artery disease   . Hypercholesteremia   . Hypertension   . S/P coronary artery bypass graft x 2     Past Surgical History:  Procedure Laterality Date  . APPENDECTOMY    . CARDIAC CATHETERIZATION    . CARPAL TUNNEL RELEASE     left hand  . CHOLECYSTECTOMY    . CHOLECYSTECTOMY    . CORONARY ARTERY BYPASS GRAFT  04/21/2015   CABG x 2  V.A.   . CORONARY STENT PLACEMENT  2010   Cordis Cypher Sirolimus-eluting stent 2.50 mm x 26 mm placed to the RCA at [facility]   . KNEE SURGERY    . KNEE SURGERY     right knee    . VASECTOMY       Current Outpatient Medications  Medication Sig Dispense Refill  . amLODipine (NORVASC) 5 MG tablet Take 5 mg by mouth daily.    Marland Kitchen aspirin 81 MG tablet Take 81 mg by mouth daily.    Marland Kitchen atorvastatin (LIPITOR) 80 MG tablet Take 80 mg by mouth daily.    . carvedilol (COREG) 25 MG tablet Take 25 mg by mouth 2 (two) times daily with a meal.    . losartan (COZAAR) 100 MG tablet Take 100 mg by mouth daily.    . pantoprazole (PROTONIX) 20 MG tablet Take 20 mg by mouth 2 (two) times daily.    . Sennosides (SENNA-EX PO) Take by mouth as needed.    . sildenafil (VIAGRA) 50 MG tablet Take 50 mg by mouth as needed for erectile dysfunction.    Marland Kitchen spironolactone (ALDACTONE) 25 MG tablet Take 25 mg by mouth daily.    . tamsulosin (FLOMAX) 0.4 MG CAPS capsule Take 0.8 mg by mouth daily after breakfast.    . torsemide (DEMADEX) 20 MG tablet Take 20 mg by mouth 3 (three) times daily.     No current facility-administered medications for this visit.     Allergies:   Patient has no known allergies.    Social History:  The patient  reports that he quit smoking about 12 years ago. His smoking use included cigarettes. He has a 0.50 pack-year smoking history. He has quit using smokeless tobacco. His smokeless tobacco use included chew. He reports that he does not drink alcohol or use drugs.   Family History:  [name] family history is negative for coronary artery disease.   ROS:  Please see the history of present illness.   Otherwise, review of systems are positive for none.   All other systems are reviewed and negative.    PHYSICAL EXAM: VS:  BP 130/80 (BP Location: Right Arm, Patient Position: Sitting, Cuff Size: Normal)   Pulse 61   Ht 5\' 7"  (1.702 m)   Wt 228 lb (103.4 kg)   BMI 35.71 kg/m  , BMI Body mass index is 35.71 kg/m. GEN: Well nourished, well developed, in no acute distress  HEENT: normal  Neck: no JVD, carotid bruits, or masses Cardiac: RRR; no murmurs, rubs, or  gallops,no edema  Respiratory:  clear to auscultation bilaterally, normal work of breathing GI: soft, nontender, nondistended, + BS MS: no deformity or atrophy  Skin: warm and dry, no rash Neuro:  Strength and sensation are intact Psych: euthymic mood, full affect   EKG:  EKG is ordered today. The ekg ordered today demonstrates normal sinus rhythm with nonspecific ST changes.   Recent Labs: No results found for requested labs within last 8760 hours.    Lipid Panel No results found for: CHOL, TRIG, HDL, CHOLHDL, VLDL, LDLCALC, LDLDIRECT    Wt Readings from Last 3 Encounters:  06/29/17 228 lb (103.4 kg)  11/07/15 214 lb 8 oz (97.3 kg)  08/26/15 211 lb (95.7 kg)       PAD Screen 06/29/2017  Previous PAD dx? No  Previous surgical procedure? No  Pain with walking? Yes  Subsides with rest? No  Feet/toe relief with dangling? No  Painful, non-healing ulcers? No  Extremities discolored? No      ASSESSMENT AND PLAN:  1.  Coronary artery disease involving native coronary arteries with atypical angina: The patient reports recurrent chest pain over the last 2-3 months.  Some of his symptoms are atypical and happening at rest.  Given his known history of coronary artery disease and previous CABG, I requested a pharmacologic nuclear stress test for evaluation.  He reports inability to achieve maximal predicted heart rate with treadmill stress testing in the past. We requested his previous records.  2.  Chronic heart failure: Ejection fraction is unknown.  Will be determined by nuclear stress test and will also request his previous records.  The patient had gradual increase in weight since 2017.  He does have mild leg edema but does not appear to be grossly volume overloaded.  Continue same medications for now.  3.  Essential hypertension: Blood pressure is controlled.  4.  Hyperlipidemia: Continue treatment with atorvastatin with a target LDL of less than 70.    Disposition:    FU with me in 2 months  Signed,  Lorine BearsMuhammad Felipe Cabell, MD  06/29/2017 5:43 PM    [facility]

## 2017-06-29 NOTE — Patient Instructions (Addendum)
Medication Instructions:  Your physician recommends that you continue on your current medications as directed. Please refer to the Current Medication list given to you today.   Labwork: none  Testing/Procedures: Your physician has requested that you have a lexiscan myoview. For further information please visit [email or web address] Please follow instruction sheet, as given.  [facility] MYOVIEW  Your caregiver has ordered a Stress Test with nuclear imaging. The purpose of this test is to evaluate the blood supply to your heart muscle. This procedure is referred to as a "Non-Invasive Stress Test." This is because other than having an IV started in your vein, nothing is inserted or "invades" your body. Cardiac stress tests are done to find areas of poor blood flow to the heart by determining the extent of coronary artery disease (CAD). Some patients exercise on a treadmill, which naturally increases the blood flow to your heart, while others who are  unable to walk on a treadmill due to physical limitations have a pharmacologic/chemical stress agent called Lexiscan . This medicine will mimic walking on a treadmill by temporarily increasing your coronary blood flow.   Please note: these test may take anywhere between 2-4 hours to complete  PLEASE REPORT TO [facility] MEDICAL MALL ENTRANCE  THE VOLUNTEERS AT THE FIRST DESK WILL DIRECT YOU WHERE TO GO  Date of Procedure:___January 24______  Arrival Time for Procedure:___7:15am_____  Instructions regarding medication:     _xx___:  Hold other medications as follows: torsemide and spironolactone the morning of your test.   PLEASE NOTIFY THE OFFICE AT LEAST 24 HOURS IN ADVANCE IF YOU ARE UNABLE TO KEEP YOUR APPOINTMENT.  [phone] AND  PLEASE NOTIFY NUCLEAR MEDICINE AT [facility] AT LEAST 24 HOURS IN ADVANCE IF YOU ARE UNABLE TO KEEP YOUR APPOINTMENT. [phone]  How to prepare for your Myoview test:  1. Do not eat or drink after midnight 2. No caffeine  for 24 hours prior to test 3. No smoking 24 hours prior to test. 4. Your medication may be taken with water.  If your doctor stopped a medication because of this test, do not take that medication. 5. Ladies, please do not wear dresses.  Skirts or pants are appropriate. Please wear a short sleeve shirt. 6. No perfume, cologne or lotion. 7. Wear comfortable walking shoes. No heels!            Follow-Up: Your physician recommends that you schedule a follow-up appointment in: 2 months with Dr. Kirke Corin.    Any Other Special Instructions Will Be Listed Below (If Applicable).     If you need a refill on your cardiac medications before your next appointment, please call your pharmacy.  Cardiac Nuclear Scan A cardiac nuclear scan is a test that measures blood flow to the heart when a person is resting and when he or she is exercising. The test looks for problems such as:  Not enough blood reaching a portion of the heart.  The heart muscle not working normally.  You may need this test if:  You have heart disease.  You have had abnormal lab results.  You have had heart surgery or angioplasty.  You have chest pain.  You have shortness of breath.  In this test, a radioactive dye (tracer) is injected into your bloodstream. After the tracer has traveled to your heart, an imaging device is used to measure how much of the tracer is absorbed by or distributed to various areas of your heart. This procedure is usually done at a hospital and takes  2-4 hours. Tell a health care provider about:  Any allergies you have.  All medicines you are taking, including vitamins, herbs, eye drops, creams, and over-the-counter medicines.  Any problems you or family members have had with the use of anesthetic medicines.  Any blood disorders you have.  Any surgeries you have had.  Any medical conditions you have.  Whether you are pregnant or may be pregnant. What are the risks? Generally, this  is a safe procedure. However, problems may occur, including:  Serious chest pain and heart attack. This is only a risk if the stress portion of the test is done.  Rapid heartbeat.  Sensation of warmth in your chest. This usually passes quickly.  What happens before the procedure?  Ask your health care provider about changing or stopping your regular medicines. This is especially important if you are taking diabetes medicines or blood thinners.  Remove your jewelry on the day of the procedure. What happens during the procedure?  An IV tube will be inserted into one of your veins.  Your health care provider will inject a small amount of radioactive tracer through the tube.  You will wait for 20-40 minutes while the tracer travels through your bloodstream.  Your heart activity will be monitored with an electrocardiogram (ECG).  You will lie down on an exam table.  Images of your heart will be taken for about 15-20 minutes.  You may be asked to exercise on a treadmill or stationary bike. While you exercise, your heart's activity will be monitored with an ECG, and your blood pressure will be checked. If you are unable to exercise, you may be given a medicine to increase blood flow to parts of your heart.  When blood flow to your heart has peaked, a tracer will again be injected through the IV tube.  After 20-40 minutes, you will get back on the exam table and have more images taken of your heart.  When the procedure is over, your IV tube will be removed. The procedure may vary among health care providers and hospitals. Depending on the type of tracer used, scans may need to be repeated 3-4 hours later. What happens after the procedure?  Unless your health care provider tells you otherwise, you may return to your normal schedule, including diet, activities, and medicines.  Unless your health care provider tells you otherwise, you may increase your fluid intake. This will help flush  the contrast dye from your body. Drink enough fluid to keep your urine clear or pale yellow.  It is up to you to get your test results. Ask your health care provider, or the department that is doing the test, when your results will be ready. Summary  A cardiac nuclear scan measures the blood flow to the heart when a person is resting and when he or she is exercising.  You may need this test if you are at risk for heart disease.  Tell your health care provider if you are pregnant.  Unless your health care provider tells you otherwise, increase your fluid intake. This will help flush the contrast dye from your body. Drink enough fluid to keep your urine clear or pale yellow. This information is not intended to replace advice given to you by your health care provider. Make sure you discuss any questions you have with your health care provider. Document Released: 06/26/2004 Document Revised: 06/03/2016 Document Reviewed: 05/10/2013 Elsevier Interactive Patient Education  2017 [employer]

## 2017-06-30 ENCOUNTER — Ambulatory Visit
Admission: RE | Admit: 2017-06-30 | Discharge: 2017-06-30 | Disposition: A | Discharge status: Home / Self Care | Source: Ambulatory Visit | Attending: Physician Assistant | Admitting: Physician Assistant

## 2017-06-30 ENCOUNTER — Ambulatory Visit (INDEPENDENT_AMBULATORY_CARE_PROVIDER_SITE_OTHER): Admitting: Physician Assistant

## 2017-06-30 ENCOUNTER — Encounter: Payer: Self-pay | Admitting: Physician Assistant

## 2017-06-30 VITALS — BP 120/64 | HR 65 | Temp 98.9°F | Resp 16 | Ht 67.0 in | Wt 225.0 lb

## 2017-06-30 DIAGNOSIS — M79605 Pain in left leg: Secondary | ICD-10-CM | POA: Insufficient documentation

## 2017-06-30 DIAGNOSIS — N401 Enlarged prostate with lower urinary tract symptoms: Secondary | ICD-10-CM | POA: Diagnosis not present

## 2017-06-30 DIAGNOSIS — I10 Essential (primary) hypertension: Secondary | ICD-10-CM

## 2017-06-30 DIAGNOSIS — R1310 Dysphagia, unspecified: Secondary | ICD-10-CM | POA: Diagnosis not present

## 2017-06-30 DIAGNOSIS — G4733 Obstructive sleep apnea (adult) (pediatric): Secondary | ICD-10-CM

## 2017-06-30 DIAGNOSIS — R3912 Poor urinary stream: Secondary | ICD-10-CM | POA: Diagnosis not present

## 2017-06-30 DIAGNOSIS — K2100 Gastro-esophageal reflux disease with esophagitis, without bleeding: Secondary | ICD-10-CM

## 2017-06-30 DIAGNOSIS — E785 Hyperlipidemia, unspecified: Secondary | ICD-10-CM

## 2017-06-30 DIAGNOSIS — I509 Heart failure, unspecified: Secondary | ICD-10-CM

## 2017-06-30 DIAGNOSIS — M5136 Other intervertebral disc degeneration, lumbar region: Secondary | ICD-10-CM | POA: Diagnosis not present

## 2017-06-30 DIAGNOSIS — Z7689 Persons encountering health services in other specified circumstances: Secondary | ICD-10-CM

## 2017-06-30 NOTE — Progress Notes (Signed)
Patient: Jonathon Newman Male    DOB: 1956/05/16   62 y.o.   MRN: [id number] Visit Date: 06/30/2017  Today's Provider: Margaretann Loveless, PA-C   Chief Complaint  Patient presents with  . Establish Care   Subjective:    HPI Jonathon Newman is a 62 year old male who presents today to Establish Care as a new patient. He was previously receiving care from the [place] in [place] but was told he would need to seek care elsewhere by 06/14/17. He is retired Company secretary.   He has been having SOB, chest pain worsening over the last 2-3 months per patient. He does have known CAD with a stent placed in RCA in 2010, then subsequent two vessel CABG in 2016. He also has known CHF, HLD and HTN. He is currently on amlodipine 5mg  daily, carvedilol 25mg  BID, losartan 100mg  daily, atorvastatin 80mg  daily, torsemide 20mg  TID, and spironolactone 25mg  daily for these issues. He is now being followed by Cardiology, Dr. Kirke Corin.  He also has known gastritis. He reports he had most recent EGD and colonoscopy done on 05/27/17 before leaving the [place]. He reports having tubular adenoma polyps found and was told to repeat his colonoscopy in 3 years. He does not know if he has to f/u for EGD. He is currently taking Protonix 20mg  BID. He reports good compliance.   He also has been having worsening voiding symptoms. He reports hesitancy, weakened stream, frequency, post void dribble. He does have known BPH. I do not have any recent labs and he cannot recall if his PSA has been elevated. He does currently use Tamsulosin 0.4mg  (2 tabs daily).   He is also having worsening left leg pain. He feels the pain radiate around the left hip and down to his ankle. He does have back pain as well but this is chronic and he has not noticed many changes. He denies any weakness to the left leg. He denies any bowel or bladder incontinence.     No Known Allergies   Current Outpatient Medications:  .  amLODipine (NORVASC) 5 MG tablet, Take 5  mg by mouth daily., Disp: , Rfl:  .  aspirin 81 MG tablet, Take 81 mg by mouth daily., Disp: , Rfl:  .  atorvastatin (LIPITOR) 80 MG tablet, Take 80 mg by mouth daily., Disp: , Rfl:  .  carvedilol (COREG) 25 MG tablet, Take 25 mg by mouth 2 (two) times daily with a meal., Disp: , Rfl:  .  losartan (COZAAR) 100 MG tablet, Take 100 mg by mouth daily., Disp: , Rfl:  .  pantoprazole (PROTONIX) 20 MG tablet, Take 20 mg by mouth 2 (two) times daily., Disp: , Rfl:  .  Sennosides (SENNA-EX PO), Take by mouth as needed., Disp: , Rfl:  .  sildenafil (VIAGRA) 50 MG tablet, Take 50 mg by mouth as needed for erectile dysfunction., Disp: , Rfl:  .  spironolactone (ALDACTONE) 25 MG tablet, Take 25 mg by mouth daily., Disp: , Rfl:  .  tamsulosin (FLOMAX) 0.4 MG CAPS capsule, Take 0.8 mg by mouth daily after breakfast., Disp: , Rfl:  .  torsemide (DEMADEX) 20 MG tablet, Take 20 mg by mouth 3 (three) times daily., Disp: , Rfl:   Review of Systems  Constitutional: Positive for fatigue.  HENT: Positive for sneezing and trouble swallowing.   Eyes: Negative.   Respiratory: Positive for apnea, cough, chest tightness and shortness of breath.   Cardiovascular: Positive for chest  pain and leg swelling.  Gastrointestinal: Negative.   Endocrine: Positive for heat intolerance.  Genitourinary: Positive for difficulty urinating and frequency.  Musculoskeletal: Positive for arthralgias and back pain.  Skin: Negative.   Allergic/Immunologic: Negative.   Neurological: Positive for dizziness, light-headedness and numbness.  Hematological: Negative.   Psychiatric/Behavioral: Negative.     Social History   Tobacco Use  . Smoking status: Former Smoker    Packs/day: 0.25    Years: 2.00    Pack years: 0.50    Types: Cigarettes    Last attempt to quit: 05/26/2005    Years since quitting: 12.1  . Smokeless tobacco: Former NeurosurgeonUser    Types: Chew  Substance Use Topics  . Alcohol use: No   Objective:   BP 120/64 (BP  Location: Left Arm, Patient Position: Sitting, Cuff Size: Large)   Pulse 65   Temp 98.9 F (37.2 C) (Oral)   Resp 16   Ht 5\' 7"  (1.702 m)   Wt 225 lb (102.1 kg)   SpO2 96%   BMI 35.24 kg/m     Physical Exam  Constitutional: He is oriented to person, place, and time. He appears well-developed and well-nourished.  HENT:  Head: Normocephalic and atraumatic.  Right Ear: Hearing, tympanic membrane, external ear and ear canal normal.  Left Ear: Hearing, tympanic membrane, external ear and ear canal normal.  Nose: Nose normal.  Mouth/Throat: Uvula is midline, oropharynx is clear and moist and mucous membranes are normal.  Eyes: Conjunctivae and EOM are normal. Pupils are equal, round, and reactive to light. Right eye exhibits no discharge.  Neck: Normal range of motion. Neck supple. No tracheal deviation present. No thyromegaly present.  Cardiovascular: Normal rate, regular rhythm, normal heart sounds and intact distal pulses.  No murmur heard. Pulmonary/Chest: Effort normal and breath sounds normal. No respiratory distress. He has no wheezes. He has no rales. He exhibits no tenderness.  Abdominal: Soft. He exhibits no distension and no mass. There is no tenderness. There is no rebound and no guarding.  Musculoskeletal: Normal range of motion. He exhibits no edema or tenderness.  Lymphadenopathy:    He has no cervical adenopathy.  Neurological: He is alert and oriented to person, place, and time. He has normal reflexes. No cranial nerve deficit. He exhibits normal muscle tone. Coordination normal.  Skin: Skin is warm and dry. No rash noted. No erythema.  Psychiatric: He has a normal mood and affect. His behavior is normal. Judgment and thought content normal.        Assessment & Plan:     1. Establishing care with new doctor, encounter for Records [facility] recent [facility] and [facility].   2. Benign essential HTN Stable on Losartan, carvedilol, and amlodipine.  Also followed by cardiology, Dr. Kirke CorinArida.  3. Left leg pain Worsening. Will obtain imaging to see if from hip or back. High suspicion for back involvement with radiculopathy. If either show OA or DDD may refer to orthopedics for further evaluation.  - DG HIP UNILAT WITH PELVIS 2-3 VIEWS LEFT; Future - DG Lumbar Spine Complete; Future  4. Benign prostatic hyperplasia with weak urinary stream Worsening despite being on tamsulosin 0.4mg  (0.8mg  total). Will refer to Urology for further evaluation and testing.  - Ambulatory referral to Urology  5. Dysphagia, unspecified type Worsening dysphagia. [facility] is [facility] to [facility] shown [facility]. He was started on Protonix BID. If no improvements he was told to have further testing for  cause of the difficulty swallowing. Referral placed for GI for further evaluation.  - Ambulatory referral to Gastroenterology  6. OSA (obstructive sleep apnea) Stable on CPAP. Written Rx for a new mask given to patient.   7. Hyperlipidemia, unspecified hyperlipidemia type Stable on atorvastatin 80mg .   8. Gastroesophageal reflux disease with esophagitis Stable on Protonix BID. Still with dysphagia.   9. Congestive heart failure, unspecified HF chronicity, unspecified heart failure type (HCC) Getting new echo for current EF with Dr. Kirke Corin. Stable on BP meds and torsemide 60mg  total daily.        Margaretann Loveless, PA-C  [facility]

## 2017-07-01 ENCOUNTER — Telehealth: Payer: Self-pay

## 2017-07-01 ENCOUNTER — Telehealth: Payer: Self-pay | Admitting: Physician Assistant

## 2017-07-01 DIAGNOSIS — M79605 Pain in left leg: Secondary | ICD-10-CM

## 2017-07-01 DIAGNOSIS — M5136 Other intervertebral disc degeneration, lumbar region: Secondary | ICD-10-CM

## 2017-07-01 NOTE — Telephone Encounter (Signed)
-----   Message from Margaretann LovelessJennifer M Burnette, PA-C sent at 07/01/2017  9:42 AM EST ----- Mild DDD noted in L4-L5 disc space. This could contribute to the leg pain you are having if there is some nerve compression going on from this. Hip xray was normal. If this is problematic enough I would want to refer you to a back orthopedic.

## 2017-07-01 NOTE — Telephone Encounter (Signed)
Left message to call back  

## 2017-07-01 NOTE — Telephone Encounter (Signed)
Referral placed.

## 2017-07-01 NOTE — Telephone Encounter (Signed)
Pt stated he was returning Sarah's call. Thanks TNP

## 2017-07-01 NOTE — Telephone Encounter (Signed)
Patient advised as directed below. Per patient he does wants the referral back to Ortho.  Thanks,  -Joseline

## 2017-07-05 ENCOUNTER — Encounter: Payer: Self-pay | Admitting: Physician Assistant

## 2017-07-05 DIAGNOSIS — R3912 Poor urinary stream: Secondary | ICD-10-CM

## 2017-07-05 DIAGNOSIS — N401 Enlarged prostate with lower urinary tract symptoms: Secondary | ICD-10-CM | POA: Insufficient documentation

## 2017-07-05 DIAGNOSIS — M5136 Other intervertebral disc degeneration, lumbar region: Secondary | ICD-10-CM | POA: Insufficient documentation

## 2017-07-05 DIAGNOSIS — H8113 Benign paroxysmal vertigo, bilateral: Secondary | ICD-10-CM

## 2017-07-05 DIAGNOSIS — I509 Heart failure, unspecified: Secondary | ICD-10-CM | POA: Insufficient documentation

## 2017-07-05 DIAGNOSIS — M47816 Spondylosis without myelopathy or radiculopathy, lumbar region: Secondary | ICD-10-CM | POA: Insufficient documentation

## 2017-07-05 DIAGNOSIS — K2100 Gastro-esophageal reflux disease with esophagitis, without bleeding: Secondary | ICD-10-CM | POA: Insufficient documentation

## 2017-07-05 DIAGNOSIS — R1310 Dysphagia, unspecified: Secondary | ICD-10-CM | POA: Insufficient documentation

## 2017-07-05 HISTORY — DX: Benign paroxysmal vertigo, bilateral: H81.13

## 2017-07-05 NOTE — Telephone Encounter (Signed)
Pt called back stating he was returning Sarah's call. Thanks TNP

## 2017-07-08 ENCOUNTER — Encounter
Admission: RE | Admit: 2017-07-08 | Discharge: 2017-07-08 | Disposition: A | Discharge status: Home / Self Care | Source: Ambulatory Visit | Attending: Cardiovascular Disease | Admitting: Cardiovascular Disease

## 2017-07-08 DIAGNOSIS — R079 Chest pain, unspecified: Secondary | ICD-10-CM

## 2017-07-08 LAB — NM MYOCAR MULTI W/SPECT W/WALL MOTION / EF
CHL CUP MPHR: 160 {beats}/min
CHL CUP NUCLEAR SDS: 0
CHL CUP NUCLEAR SSS: 1
CSEPEW: 1 METS
CSEPPHR: 86 {beats}/min
Exercise duration (min): 0 min
Exercise duration (sec): 0 s
LV dias vol: 98 mL (ref 62–150)
LV sys vol: 29 mL
NUC STRESS TID: 0.98
Percent HR: 53 %
Rest HR: 55 {beats}/min
SRS: 1

## 2017-07-08 MED ORDER — TECHNETIUM TC 99M TETROFOSMIN IV KIT
32.9260 | PACK | Freq: Once | INTRAVENOUS | Status: AC | PRN
  Administered 2017-07-08: 32.926 via INTRAVENOUS

## 2017-07-08 MED ORDER — REGADENOSON 0.4 MG/5ML IV SOLN
0.4000 mg | Freq: Once | INTRAVENOUS | Status: AC
  Administered 2017-07-08: 0.4 mg via INTRAVENOUS

## 2017-07-08 MED ORDER — TECHNETIUM TC 99M TETROFOSMIN IV KIT
13.4280 | PACK | Freq: Once | INTRAVENOUS | Status: AC | PRN
  Administered 2017-07-08: 13.428 via INTRAVENOUS

## 2017-07-09 DIAGNOSIS — M5416 Radiculopathy, lumbar region: Secondary | ICD-10-CM | POA: Insufficient documentation

## 2017-07-09 DIAGNOSIS — G8929 Other chronic pain: Secondary | ICD-10-CM

## 2017-07-09 DIAGNOSIS — M545 Low back pain, unspecified: Secondary | ICD-10-CM | POA: Insufficient documentation

## 2017-07-09 HISTORY — DX: Other chronic pain: G89.29

## 2017-07-21 ENCOUNTER — Encounter: Payer: Self-pay | Admitting: Urology

## 2017-07-21 ENCOUNTER — Ambulatory Visit (INDEPENDENT_AMBULATORY_CARE_PROVIDER_SITE_OTHER): Admitting: Urology

## 2017-07-21 VITALS — BP 120/74 | HR 67 | Ht 67.0 in | Wt 226.4 lb

## 2017-07-21 DIAGNOSIS — R339 Retention of urine, unspecified: Secondary | ICD-10-CM | POA: Diagnosis not present

## 2017-07-21 DIAGNOSIS — N401 Enlarged prostate with lower urinary tract symptoms: Secondary | ICD-10-CM | POA: Diagnosis not present

## 2017-07-21 DIAGNOSIS — R3912 Poor urinary stream: Secondary | ICD-10-CM

## 2017-07-21 LAB — URINALYSIS, COMPLETE
BILIRUBIN UA: NEGATIVE
Glucose, UA: NEGATIVE
Ketones, UA: NEGATIVE
Leukocytes, UA: NEGATIVE
Nitrite, UA: NEGATIVE
PH UA: 7 (ref 5.0–7.5)
Protein, UA: NEGATIVE
RBC, UA: NEGATIVE
Specific Gravity, UA: 1.015 (ref 1.005–1.030)
UUROB: 0.2 mg/dL (ref 0.2–1.0)

## 2017-07-21 LAB — BLADDER SCAN AMB NON-IMAGING: SCAN RESULT: 368

## 2017-07-21 NOTE — Progress Notes (Signed)
07/21/2017 9:13 AM   Jonathon Newman 02/26/1956 [id number]  Referring provider: Margaretann LovelessBurnette, [name], PA-C [address] [place], [place] [id number]  Chief Complaint  Patient presents with  . Benign Prostatic Hypertrophy    HPI: Jonathon HerDavid Newman is a 62 year old male seen in consultation at the request of [name] for evaluation of BPH with lower urinary tract symptoms.  He presents with a 2-year history of voiding difficulties including decreased force and caliber of his urinary stream, hesitancy, postvoid dribbling, straining to urinate, urgency and nocturia x4-5.  He states his symptoms started after bypass surgery approximately 2 years ago.  He does not think he had an indwelling Foley catheter long during that hospitalization.  He has been on tamsulosin 0.4 mg approximately 2 years and was increased to 0.8 mg 1 year ago.  His symptoms have slowly worsened but have been stable for the past 12 months.  He denies dysuria or gross hematuria.  Denies flank, abdominal, pelvic or scrotal pain.  He denies prior urologic evaluation.  He states a PSA approximately 1 year ago was normal.   PMH: Past Medical History:  Diagnosis Date  . Arthritis   . CHF (congestive heart failure) (HCC)   . Coronary artery disease   . GERD (gastroesophageal reflux disease)   . Hypercholesteremia   . Hypertension   . S/P coronary artery bypass graft x 2   . Sleep apnea     Surgical History: Past Surgical History:  Procedure Laterality Date  . APPENDECTOMY    . CARDIAC CATHETERIZATION    . CARPAL TUNNEL RELEASE     left hand  . CHOLECYSTECTOMY    . CHOLECYSTECTOMY    . CORONARY ARTERY BYPASS GRAFT  04/21/2015   CABG x 2 [place] [facility]   . CORONARY STENT PLACEMENT  2010   Cordis Cypher Sirolimus-eluting stent 2.50 mm x 26 [facility]   . FRACTURE SURGERY    . KNEE SURGERY    . KNEE SURGERY     right knee   . VASECTOMY      Home  Medications:  Allergies as of 07/21/2017   No Known Allergies     Medication List        Accurate as of 07/21/17  9:13 AM. Always use your most recent med list.          amLODipine 5 MG tablet Commonly known as:  NORVASC Take 5 mg by mouth daily.   aspirin 81 MG tablet Take 81 mg by mouth daily.   atorvastatin 80 MG tablet Commonly known as:  LIPITOR Take 80 mg by mouth daily.   carvedilol 25 MG tablet Commonly known as:  COREG Take 25 mg by mouth 2 (two) times daily with a meal.   losartan 100 MG tablet Commonly known as:  COZAAR Take 100 mg by mouth daily.   pantoprazole 20 MG tablet Commonly known as:  PROTONIX Take 20 mg by mouth 2 (two) times daily.   SENNA-EX PO Take by mouth as needed.   sildenafil 50 MG tablet Commonly known as:  VIAGRA Take 50 mg by mouth as needed for erectile dysfunction.   spironolactone 25 MG tablet Commonly known as:  ALDACTONE Take 25 mg by mouth daily.   tamsulosin 0.4 MG Caps capsule Commonly known as:  FLOMAX Take 0.8 mg by mouth daily after breakfast.   torsemide 20 MG tablet Commonly known as:  DEMADEX Take 20 mg by  mouth 3 (three) times daily.       Allergies: No Known Allergies  Family History: Family History  Problem Relation Age of Onset  . Arthritis Mother   . Hyperlipidemia Father   . Hypertension Father   . Heart attack Father 47  . Heart murmur Brother   . Valvular heart disease Brother   . Cataracts Maternal Grandmother   . Glaucoma Maternal Grandmother   . Cancer Maternal Grandfather   . Heart attack Paternal Grandfather   . Hypertension Paternal Grandfather   . Prostate cancer Neg Hx   . Bladder Cancer Neg Hx   . Kidney cancer Neg Hx     Social History:  reports that he quit smoking about 12 years ago. His smoking use included cigarettes. He has a 0.50 pack-year smoking history. He has quit using smokeless tobacco. His smokeless tobacco use included chew. He reports that he does not drink  alcohol or use drugs.  ROS: UROLOGY Frequent Urination?: No Hard to postpone urination?: Yes Burning/pain with urination?: No Get up at night to urinate?: Yes Leakage of urine?: No Urine stream starts and stops?: No Trouble starting stream?: Yes Do you have to strain to urinate?: Yes Blood in urine?: No Urinary tract infection?: No Sexually transmitted disease?: No Injury to kidneys or bladder?: No Painful intercourse?: No Weak stream?: Yes Erection problems?: Yes Penile pain?: No  Gastrointestinal Nausea?: No Vomiting?: No Indigestion/heartburn?: No Diarrhea?: No Constipation?: No  Constitutional Fever: No Night sweats?: No Weight loss?: No Fatigue?: Yes  Skin Skin rash/lesions?: No Itching?: No  Eyes Blurred vision?: No Double vision?: No  Ears/Nose/Throat Sore throat?: No Sinus problems?: No  Hematologic/Lymphatic Swollen glands?: No Easy bruising?: Yes  Cardiovascular Leg swelling?: Yes Chest pain?: Yes  Respiratory Cough?: Yes Shortness of breath?: Yes  Endocrine Excessive thirst?: No  Musculoskeletal Back pain?: Yes Joint pain?: Yes  Neurological Headaches?: Yes Dizziness?: Yes  Psychologic Depression?: No Anxiety?: No  Physical Exam: BP 120/74 (BP Location: Right Arm, Patient Position: Sitting, Cuff Size: Normal)   Pulse 67   Ht 5\' 7"  (1.702 m)   Wt 226 lb 6.4 oz (102.7 kg)   BMI 35.46 kg/m   Constitutional:  Alert and oriented, No acute distress. HEENT: Mark AT, moist mucus membranes.  Trachea midline, no masses. Cardiovascular: No clubbing, cyanosis, or edema. Respiratory: Normal respiratory effort, no increased work of breathing. GI: Abdomen is soft, nontender, nondistended, no abdominal masses GU: No CVA tenderness.  Prostate 50 g, smooth without nodules Skin: No rashes, bruises or suspicious lesions. Lymph: No cervical or inguinal adenopathy. Neurologic: Grossly intact, no focal deficits, moving all 4  extremities. Psychiatric: Normal mood and affect.  Laboratory Data: Lab Results  Component Value Date   WBC 7.4 08/26/2015   HGB 16.3 08/26/2015   HCT 46.3 08/26/2015   MCV 86.9 08/26/2015   PLT 188 08/26/2015    Lab Results  Component Value Date   CREATININE 1.00 05/19/2016    Urinalysis Dipstick/ microscopy negative   Assessment & Plan:  62 year old male with predominant obstructive voiding symptoms and incomplete bladder emptying.  PVR by bladder scan today was 368 mL.  He is on max alpha-blocker therapy.  He was informed a 5-ARI medication would take at least 4-6 months to determine efficacy.  I recommended scheduling cystoscopy for further evaluation.  Check PSA prior to cystoscopy.   1. Benign prostatic hyperplasia with weak urinary stream  - Urinalysis, Complete - Bladder Scan (Post Void Residual) in office  2.  Incomplete bladder emptying    Riki Altes, MD  [facility] [address] [place], [place] [phone]

## 2017-07-26 ENCOUNTER — Encounter: Payer: Self-pay | Admitting: Gastroenterology

## 2017-07-26 ENCOUNTER — Ambulatory Visit (INDEPENDENT_AMBULATORY_CARE_PROVIDER_SITE_OTHER): Admitting: Gastroenterology

## 2017-07-26 VITALS — BP 124/84 | HR 68 | Temp 98.4°F | Ht 67.0 in | Wt 223.6 lb

## 2017-07-26 DIAGNOSIS — R1310 Dysphagia, unspecified: Secondary | ICD-10-CM | POA: Diagnosis not present

## 2017-07-26 NOTE — Progress Notes (Signed)
Arlyss Repressohini R Janyth Riera, MD [address]  [address]  [place], [place] [address]  Main: [phone]  Fax: [phone]    Gastroenterology Consultation  Referring Provider:     Suella GroveBurnette, [name], P* Primary Care Physician:  Margaretann LovelessBurnette, [name], PA-C Primary Gastroenterologist:  Dr. Arlyss Repressohini R Erian Lariviere Reason for Consultation:     Dysphagia        HPI:   Leland HerDavid Alexa is a 62 y.o. male referred by Dr. Rosezetta SchlatterBurnette, Alessandra BevelsJennifer M, PA-C  for consultation & management of dysphagia. He reports having difficulty swallowing both to liquids and solids past 2 years. These are not progressive but occurred on a regular basis. He denies losing weight, in fact is gaining weight. He reports occasional heartburn. He denies regurgitation or food impaction  He reports he had most recent EGD and colonoscopy done on 05/27/17 before [facility]. He reports having tubular adenoma polyps x5 were found and was told to repeat his colonoscopy in 3 years. He does not know if he has to f/u for EGD. He has reports at home. He is currently taking Protonix 20mg  BID. He reports good compliance.   NSAIDs: None  Antiplts/Anticoagulants/Anti thrombotics: [id number]  GI Procedures:  Colonoscopy in 05/2017 5 polyps were detected, repeat in 3 years   Past Medical History:  Diagnosis Date  . Arthritis   . CHF (congestive heart failure) (HCC)   . Coronary artery disease   . GERD (gastroesophageal reflux disease)   . Hypercholesteremia   . Hypertension   . S/P coronary artery bypass graft x 2   . Sleep apnea     Past Surgical History:  Procedure Laterality Date  . APPENDECTOMY    . CARDIAC CATHETERIZATION    . CARPAL TUNNEL RELEASE     left hand  . CHOLECYSTECTOMY    . CHOLECYSTECTOMY    . CORONARY ARTERY BYPASS GRAFT  04/21/2015   CABG x 2 [place] [facility]   . CORONARY STENT PLACEMENT  2010   Cordis Cypher Sirolimus-eluting stent 2.50 mm x 26 [facility]   . FRACTURE  SURGERY    . KNEE SURGERY    . KNEE SURGERY     right knee   . VASECTOMY      Current Outpatient Medications:  .  amLODipine (NORVASC) 5 MG tablet, Take 5 mg by mouth daily., Disp: , Rfl:  .  aspirin 81 MG tablet, Take 81 mg by mouth daily., Disp: , Rfl:  .  atorvastatin (LIPITOR) 80 MG tablet, Take 80 mg by mouth daily., Disp: , Rfl:  .  carvedilol (COREG) 25 MG tablet, Take 25 mg by mouth 2 (two) times daily with a meal., Disp: , Rfl:  .  losartan (COZAAR) 100 MG tablet, Take 100 mg by mouth daily., Disp: , Rfl:  .  pantoprazole (PROTONIX) 20 MG tablet, Take 20 mg by mouth 2 (two) times daily., Disp: , Rfl:  .  Sennosides (SENNA-EX PO), Take by mouth as needed., Disp: , Rfl:  .  sildenafil (VIAGRA) 50 MG tablet, Take 50 mg by mouth as needed for erectile dysfunction., Disp: , Rfl:  .  spironolactone (ALDACTONE) 25 MG tablet, Take 25 mg by mouth daily., Disp: , Rfl:  .  tamsulosin (FLOMAX) 0.4 MG CAPS capsule, Take 0.8 mg by mouth daily after breakfast., Disp: , Rfl:  .  torsemide (DEMADEX) 20 MG tablet, Take 20 mg by mouth 3 (three) times daily., Disp: , Rfl:  Family History  Problem Relation Age of Onset  . Arthritis Mother   . Hyperlipidemia Father   . Hypertension Father   . Heart attack Father 10  . Heart murmur Brother   . Valvular heart disease Brother   . Cataracts Maternal Grandmother   . Glaucoma Maternal Grandmother   . Cancer Maternal Grandfather   . Heart attack Paternal Grandfather   . Hypertension Paternal Grandfather   . Prostate cancer Neg Hx   . Bladder Cancer Neg Hx   . Kidney cancer Neg Hx      Social History   Tobacco Use  . Smoking status: Former Smoker    Packs/day: 0.25    Years: 2.00    Pack years: 0.50    Types: Cigarettes    Last attempt to quit: 05/26/2005    Years since quitting: 12.1  . Smokeless tobacco: Former Neurosurgeon    Types: Chew  Substance Use Topics  . Alcohol use: No  . Drug use: No    Allergies as of 07/26/2017  . (No  Known Allergies)    Review of Systems:    All systems reviewed and negative except where noted in HPI.   Physical Exam:  BP 124/84   Pulse 68   Temp 98.4 F (36.9 C) (Oral)   Ht 5\' 7"  (1.702 m)   Wt 223 lb 9.6 oz (101.4 kg)   BMI 35.02 kg/m  No LMP for male patient.  General:   Alert,  Well-developed, well-nourished, pleasant and cooperative in NAD Head:  Normocephalic and atraumatic. Eyes:  Sclera clear, no icterus.   Conjunctiva pink. Ears:  Normal auditory acuity. Nose:  No deformity, discharge, or lesions. Mouth:  No deformity or lesions,oropharynx pink & moist. Neck:  Supple; no masses or thyromegaly. Lungs:  Respirations even and unlabored.  Clear throughout to auscultation.   No wheezes, crackles, or rhonchi. No acute distress. Heart:  Regular rate and rhythm; no murmurs, clicks, rubs, or gallops. Abdomen:  Normal bowel sounds. Soft, non-tender and non-distended without masses, hepatosplenomegaly or hernias noted.  No guarding or rebound tenderness.   Rectal: Not performed Msk:  Symmetrical without gross deformities. Good, equal movement & strength bilaterally. Pulses:  Normal pulses noted. Extremities:  No clubbing or edema.  No cyanosis. Neurologic:  Alert and oriented x3;  grossly normal neurologically. Skin:  Intact without significant lesions or rashes. No jaundice. Lymph Nodes:  No significant cervical adenopathy. Psych:  Alert and cooperative. Normal mood and affect.  Imaging Studies: None  Assessment and Plan:   Rushi Chasen is a 62 y.o. Caucasian male with hypertension, hyperlipidemia, obesity, history of adenomatous polyps of colon presents with chronic dysphagia to both solids and liquids. He does not have alarm signs or symptoms. He reports having had an upper endoscopy in 05/2017 at the [place], report not available. Unclear if esophageal biopsies were performed to rule out eosinophilic esophagitis.  - Increase Protonix to 40 mg twice a day - We'll  arrange for esophageal manometry to evaluate for dysmotility - If he did not have esophageal biopsies performed and if manometry is normal, we'll discuss with patient about repeat EGD with esophageal biopsies   Follow up in one to 2 months   Arlyss Repress, MD

## 2017-08-04 ENCOUNTER — Encounter: Payer: Self-pay | Admitting: *Deleted

## 2017-08-04 ENCOUNTER — Encounter
Admission: RE | Disposition: A | Discharge status: Home / Self Care | Payer: Self-pay | Source: Ambulatory Visit | Attending: Gastroenterology

## 2017-08-04 ENCOUNTER — Ambulatory Visit
Admission: RE | Admit: 2017-08-04 | Discharge: 2017-08-04 | Disposition: A | Discharge status: Home / Self Care | Source: Ambulatory Visit | Attending: Gastroenterology | Admitting: Gastroenterology

## 2017-08-04 DIAGNOSIS — Z5309 Procedure and treatment not carried out because of other contraindication: Secondary | ICD-10-CM | POA: Insufficient documentation

## 2017-08-04 DIAGNOSIS — R1310 Dysphagia, unspecified: Secondary | ICD-10-CM | POA: Diagnosis not present

## 2017-08-04 HISTORY — PX: ESOPHAGEAL MANOMETRY: SHX5429

## 2017-08-04 SURGERY — MANOMETRY, ESOPHAGUS
Anesthesia: General

## 2017-08-04 MED ORDER — LIDOCAINE HCL 3.5 % OP GEL
OPHTHALMIC | Status: AC
  Filled 2017-08-04: qty 3

## 2017-08-04 MED ORDER — BUTAMBEN-TETRACAINE-BENZOCAINE 2-2-14 % EX AERO
INHALATION_SPRAY | CUTANEOUS | Status: AC
  Filled 2017-08-04: qty 5

## 2017-08-04 MED ORDER — LIDOCAINE HCL 3.5 % OP GEL
OPHTHALMIC | Status: AC
  Filled 2017-08-04: qty 1

## 2017-08-04 SURGICAL SUPPLY — 2 items
FACESHIELD LNG OPTICON STERILE (SAFETY) IMPLANT
GLOVE BIO SURGEON STRL SZ8 (GLOVE) ×6 IMPLANT

## 2017-08-05 ENCOUNTER — Encounter: Payer: Self-pay | Admitting: Gastroenterology

## 2017-08-16 ENCOUNTER — Other Ambulatory Visit: Payer: Self-pay | Admitting: Urology

## 2017-08-16 MED ORDER — DIAZEPAM 10 MG PO TABS: ORAL_TABLET | ORAL | 0 refills | Status: DC

## 2017-08-17 ENCOUNTER — Ambulatory Visit (INDEPENDENT_AMBULATORY_CARE_PROVIDER_SITE_OTHER): Admitting: Urology

## 2017-08-17 ENCOUNTER — Encounter: Payer: Self-pay | Admitting: Urology

## 2017-08-17 VITALS — BP 138/89 | HR 74 | Ht 67.0 in | Wt 225.1 lb

## 2017-08-17 DIAGNOSIS — R339 Retention of urine, unspecified: Secondary | ICD-10-CM

## 2017-08-17 LAB — URINALYSIS, COMPLETE
Bilirubin, UA: NEGATIVE
Glucose, UA: NEGATIVE
KETONES UA: NEGATIVE
Leukocytes, UA: NEGATIVE
NITRITE UA: NEGATIVE
PH UA: 7 (ref 5.0–7.5)
Protein, UA: NEGATIVE
Specific Gravity, UA: 1.015 (ref 1.005–1.030)
UUROB: 0.2 mg/dL (ref 0.2–1.0)

## 2017-08-17 MED ORDER — CIPROFLOXACIN HCL 500 MG PO TABS
500.0000 mg | ORAL_TABLET | Freq: Once | ORAL | Status: AC
  Administered 2017-08-17: 500 mg via ORAL

## 2017-08-17 MED ORDER — LIDOCAINE HCL 2 % EX GEL
1.0000 "application " | Freq: Once | CUTANEOUS | Status: AC
  Administered 2017-08-17: 1 via URETHRAL

## 2017-08-17 NOTE — Progress Notes (Signed)
   08/17/17  CC:  Chief Complaint  Patient presents with  . Cysto    HPI: Seen 07/21/2017 for BPH with lower urinary tract symptoms and incomplete bladder emptying.  On tamsulosin 0.8 mg daily  Blood pressure 138/89, pulse 74, height 5\' 7"  (1.702 m), weight 225 lb 1.6 oz (102.1 kg). NED. A&Ox3.     Cystoscopy Procedure Note  Patient identification was confirmed, informed consent was obtained, and patient was prepped using Betadine solution.  Lidocaine jelly was administered per urethral meatus.    Preoperative abx where received prior to procedure.   Patient took Valium 10 mg 30 minutes prior to procedure  Pre-Procedure: - Inspection reveals a normal caliber ureteral meatus.  Procedure: The flexible cystoscope was introduced without difficulty - No urethral strictures/lesions are present. - Enlarged prostate with lateral lobe enlargement and a prostatic urethral length estimated at 3 cm.  No median lobe. - Mild bladder neck elevation - Bilateral ureteral orifices identified - Bladder mucosa  reveals no ulcers, tumors, or lesions - No bladder stones - No trabeculation  Retroflexion shows no intravesical median lobe   Post-Procedure: - Patient tolerated the procedure well  Assessment/ Plan: BPH with lower urinary tract symptoms and incomplete bladder emptying.  Management options were discussed including TURP, PVP.  Minimally invasive options of UroLift and Rezum were discussed.  The pros and cons of each treatment were reviewed.  He is interested in UroLift.  He was informed we were not currently performing in [place] and will set him up with 1 of my partners at [facility] in [place].  Is PVR is approximately 350 mL.  He would like to start at least daily intermittent catheterization.  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  He was provided with supplies and instructions.

## 2017-08-24 ENCOUNTER — Encounter (INDEPENDENT_AMBULATORY_CARE_PROVIDER_SITE_OTHER): Payer: Self-pay

## 2017-08-24 ENCOUNTER — Encounter: Payer: Self-pay | Admitting: Gastroenterology

## 2017-08-24 ENCOUNTER — Ambulatory Visit (INDEPENDENT_AMBULATORY_CARE_PROVIDER_SITE_OTHER): Admitting: Gastroenterology

## 2017-08-24 VITALS — BP 142/83 | HR 64 | Temp 98.3°F | Ht 67.0 in | Wt 231.2 lb

## 2017-08-24 DIAGNOSIS — R1310 Dysphagia, unspecified: Secondary | ICD-10-CM | POA: Diagnosis not present

## 2017-08-24 NOTE — Progress Notes (Signed)
Arlyss Repress, MD [address]  [address]  [place], [place] [address]  Main: [phone]  Fax: [phone]    Gastroenterology Consultation  Referring Provider:     Suella Grove* Primary Care Physician:  Margaretann Loveless, PA-C Primary Gastroenterologist:  Dr. Arlyss Repress Reason for Consultation:     Dysphagia        HPI:   Jonathon Newman is a 62 y.o. male referred by Dr. Rosezetta Schlatter, Alessandra Bevels, PA-C  for consultation & management of dysphagia. He reports having difficulty swallowing both to liquids and solids past 2 years. These are not progressive but occur on a regular basis. He denies losing weight, in fact is gaining weight. He reports occasional heartburn. He denies regurgitation or food impaction  He reports he had most recent EGD and colonoscopy done on 05/27/17 before leaving the [place]. He reports having tubular adenoma polyps x5 were found and was told to repeat his colonoscopy in 3 years. He does not know if he has to f/u for EGD. He has reports at home. He is currently taking Protonix 20mg  BID. He reports good compliance.   Follow up visit 08/24/2017 He did not undergo esophageal manometry. They GI lab has attempted 4 times by 3 different people and from both nostrils. The catheter could not be advanced beyond the nasal cavity. Patient continues to have dysphagia to both solids and liquids, at least once daily. He reports that the symptoms have unchanged. He is taking only Protonix 20 mg 2 times daily. He denies heartburn, regurgitation. He just has a sensation of getting food stuck which goes down eventually after taking liquids. We do not have the EGD report from the [place]. He otherwise denies any complaints today.  NSAIDs: None  Antiplts/Anticoagulants/Anti thrombotics: [id number]  GI Procedures:  Colonoscopy in 05/2017 5 polyps were detected, repeat in 3 years   Past Medical History:  Diagnosis Date  . Arthritis   . CHF (congestive heart failure)  (HCC)   . Coronary artery disease   . GERD (gastroesophageal reflux disease)   . Hypercholesteremia   . Hypertension   . S/P coronary artery bypass graft x 2   . Sleep apnea     Past Surgical History:  Procedure Laterality Date  . APPENDECTOMY    . CARDIAC CATHETERIZATION    . CARPAL TUNNEL RELEASE     left hand  . CHOLECYSTECTOMY    . CHOLECYSTECTOMY    . CORONARY ARTERY BYPASS GRAFT  04/21/2015   CABG x 2 [place] [facility]   . CORONARY STENT PLACEMENT  2010   Cordis Cypher Sirolimus-eluting stent 2.50 mm x 26 mm placed to the RCA at [facility]   . ESOPHAGEAL MANOMETRY N/A 08/04/2017   Procedure: ESOPHAGEAL MANOMETRY (EM);  Surgeon: Toney Reil, MD;  Location: [facility] ENDOSCOPY;  Service: Endoscopy;  Laterality: N/A;  . FRACTURE SURGERY    . KNEE SURGERY    . KNEE SURGERY     right knee   . VASECTOMY      Current Outpatient Medications:  .  amLODipine (NORVASC) 5 MG tablet, Take 5 mg by mouth daily., Disp: , Rfl:  .  aspirin 81 MG tablet, Take 81 mg by mouth daily., Disp: , Rfl:  .  atorvastatin (LIPITOR) 80 MG tablet, Take 80 mg by mouth daily., Disp: , Rfl:  .  carvedilol (COREG) 25 MG tablet, Take 25 mg by mouth 2 (two) times daily with a meal., Disp: ,  Rfl:  .  diazepam (VALIUM) 10 MG tablet, 1 tab po 30 min prior to procedure, Disp: 1 tablet, Rfl: 0 .  losartan (COZAAR) 100 MG tablet, Take 100 mg by mouth daily., Disp: , Rfl:  .  pantoprazole (PROTONIX) 20 MG tablet, Take 20 mg by mouth 2 (two) times daily., Disp: , Rfl:  .  Sennosides (SENNA-EX PO), Take by mouth as needed., Disp: , Rfl:  .  sildenafil (VIAGRA) 50 MG tablet, Take 50 mg by mouth as needed for erectile dysfunction., Disp: , Rfl:  .  spironolactone (ALDACTONE) 25 MG tablet, Take 25 mg by mouth daily., Disp: , Rfl:  .  tamsulosin (FLOMAX) 0.4 MG CAPS capsule, Take 0.8 mg by mouth daily after breakfast., Disp: , Rfl:  .  tiZANidine (ZANAFLEX) 2 MG tablet, tizanidine 2 mg tablet  Take 1  tablet every 6 hours by oral route., Disp: , Rfl:  .  torsemide (DEMADEX) 20 MG tablet, Take 20 mg by mouth 3 (three) times daily., Disp: , Rfl:    Family History  Problem Relation Age of Onset  . Arthritis Mother   . Hyperlipidemia Father   . Hypertension Father   . Heart attack Father 41  . Heart murmur Brother   . Valvular heart disease Brother   . Cataracts Maternal Grandmother   . Glaucoma Maternal Grandmother   . Cancer Maternal Grandfather   . Heart attack Paternal Grandfather   . Hypertension Paternal Grandfather   . Prostate cancer Neg Hx   . Bladder Cancer Neg Hx   . Kidney cancer Neg Hx      Social History   Tobacco Use  . Smoking status: Former Smoker    Packs/day: 0.25    Years: 2.00    Pack years: 0.50    Types: Cigarettes    Last attempt to quit: 05/26/2005    Years since quitting: 12.2  . Smokeless tobacco: Former Neurosurgeon    Types: Chew  Substance Use Topics  . Alcohol use: No  . Drug use: No    Allergies as of 08/24/2017  . (No Known Allergies)    Review of Systems:    All systems reviewed and negative except where noted in HPI.   Physical Exam:  BP (!) 142/83   Pulse 64   Temp 98.3 F (36.8 C) (Oral)   Ht 5\' 7"  (1.702 m)   Wt 231 lb 3.2 oz (104.9 kg)   BMI 36.21 kg/m  No LMP for male patient.  General:   Alert,  Well-developed, well-nourished, pleasant and cooperative in NAD Head:  Normocephalic and atraumatic. Eyes:  Sclera clear, no icterus.   Conjunctiva pink. Ears:  Normal auditory acuity. Nose:  No deformity, discharge, or lesions. Mouth:  No deformity or lesions,oropharynx pink & moist. Neck:  Supple; no masses or thyromegaly. Lungs:  Respirations even and unlabored.  Clear throughout to auscultation.   No wheezes, crackles, or rhonchi. No acute distress. Heart:  Regular rate and rhythm; no murmurs, clicks, rubs, or gallops. Abdomen:  Normal bowel sounds. Soft, non-tender and non-distended without masses, hepatosplenomegaly or  hernias noted.  No guarding or rebound tenderness.   Rectal: Not performed Msk:  Symmetrical without gross deformities. Good, equal movement & strength bilaterally. Pulses:  Normal pulses noted. Extremities:  No clubbing or edema.  No cyanosis. Neurologic:  Alert and oriented x3;  grossly normal neurologically. Skin:  Intact without significant lesions or rashes. No jaundice. Lymph Nodes:  No significant cervical adenopathy. Psych:  Alert  and cooperative. Normal mood and affect.  Imaging Studies: None  Assessment and Plan:   Leland HerDavid Etheridge is a 62 y.o. Caucasian male with hypertension, hyperlipidemia, obesity, history of adenomatous polyps of colon presents with chronic dysphagia to both solids and liquids. He does not have alarm signs or symptoms. He reports having had an upper endoscopy in 05/2017 at the [facility], report not available. Unclear if esophageal biopsies were performed to rule out eosinophilic esophagitis. Esophageal manometry was attempted but unsuccessful as catheter could not be passed beyond the nasal cavity  - Increase Protonix to 40 mg twice a day - perform barium esophagogram - obtain EGD report from the [facility] - Low threshold to repeat EGD if we are not able to get the EGD report within next 2-3 weeks  Colon polyps: Repeat colonoscopy in 03/2020  Follow up in 2 months   Arlyss Repressohini R Vanga, MD

## 2017-08-27 ENCOUNTER — Ambulatory Visit: Admitting: Cardiovascular Disease

## 2017-08-27 IMAGING — CR DG CHEST 2V
2 series · 2 of 2 positions shown · non-contrast
Comparison: None.

CLINICAL DATA: Chest pain, shortness of breath with exertion.

EXAM:
CHEST  2 VIEW

[chest pa]
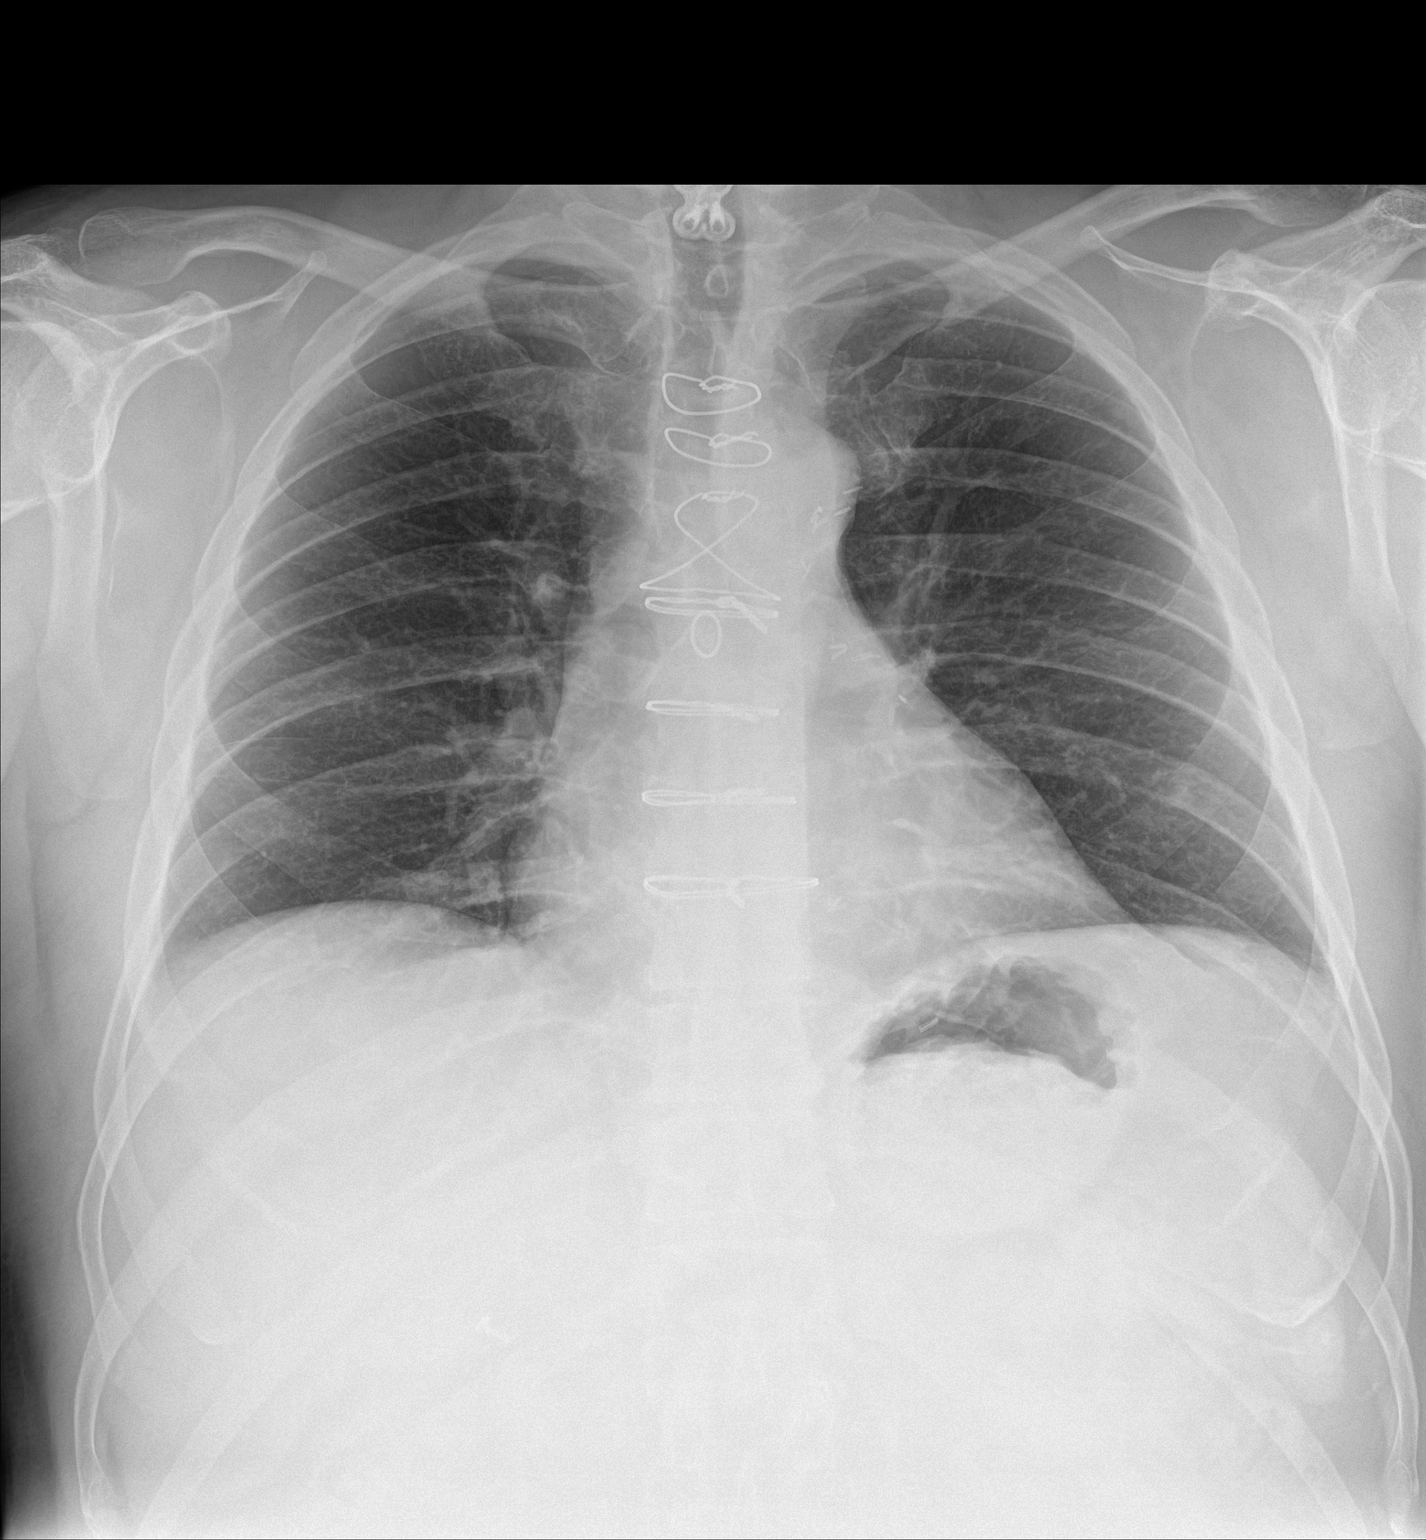

[chest lat]
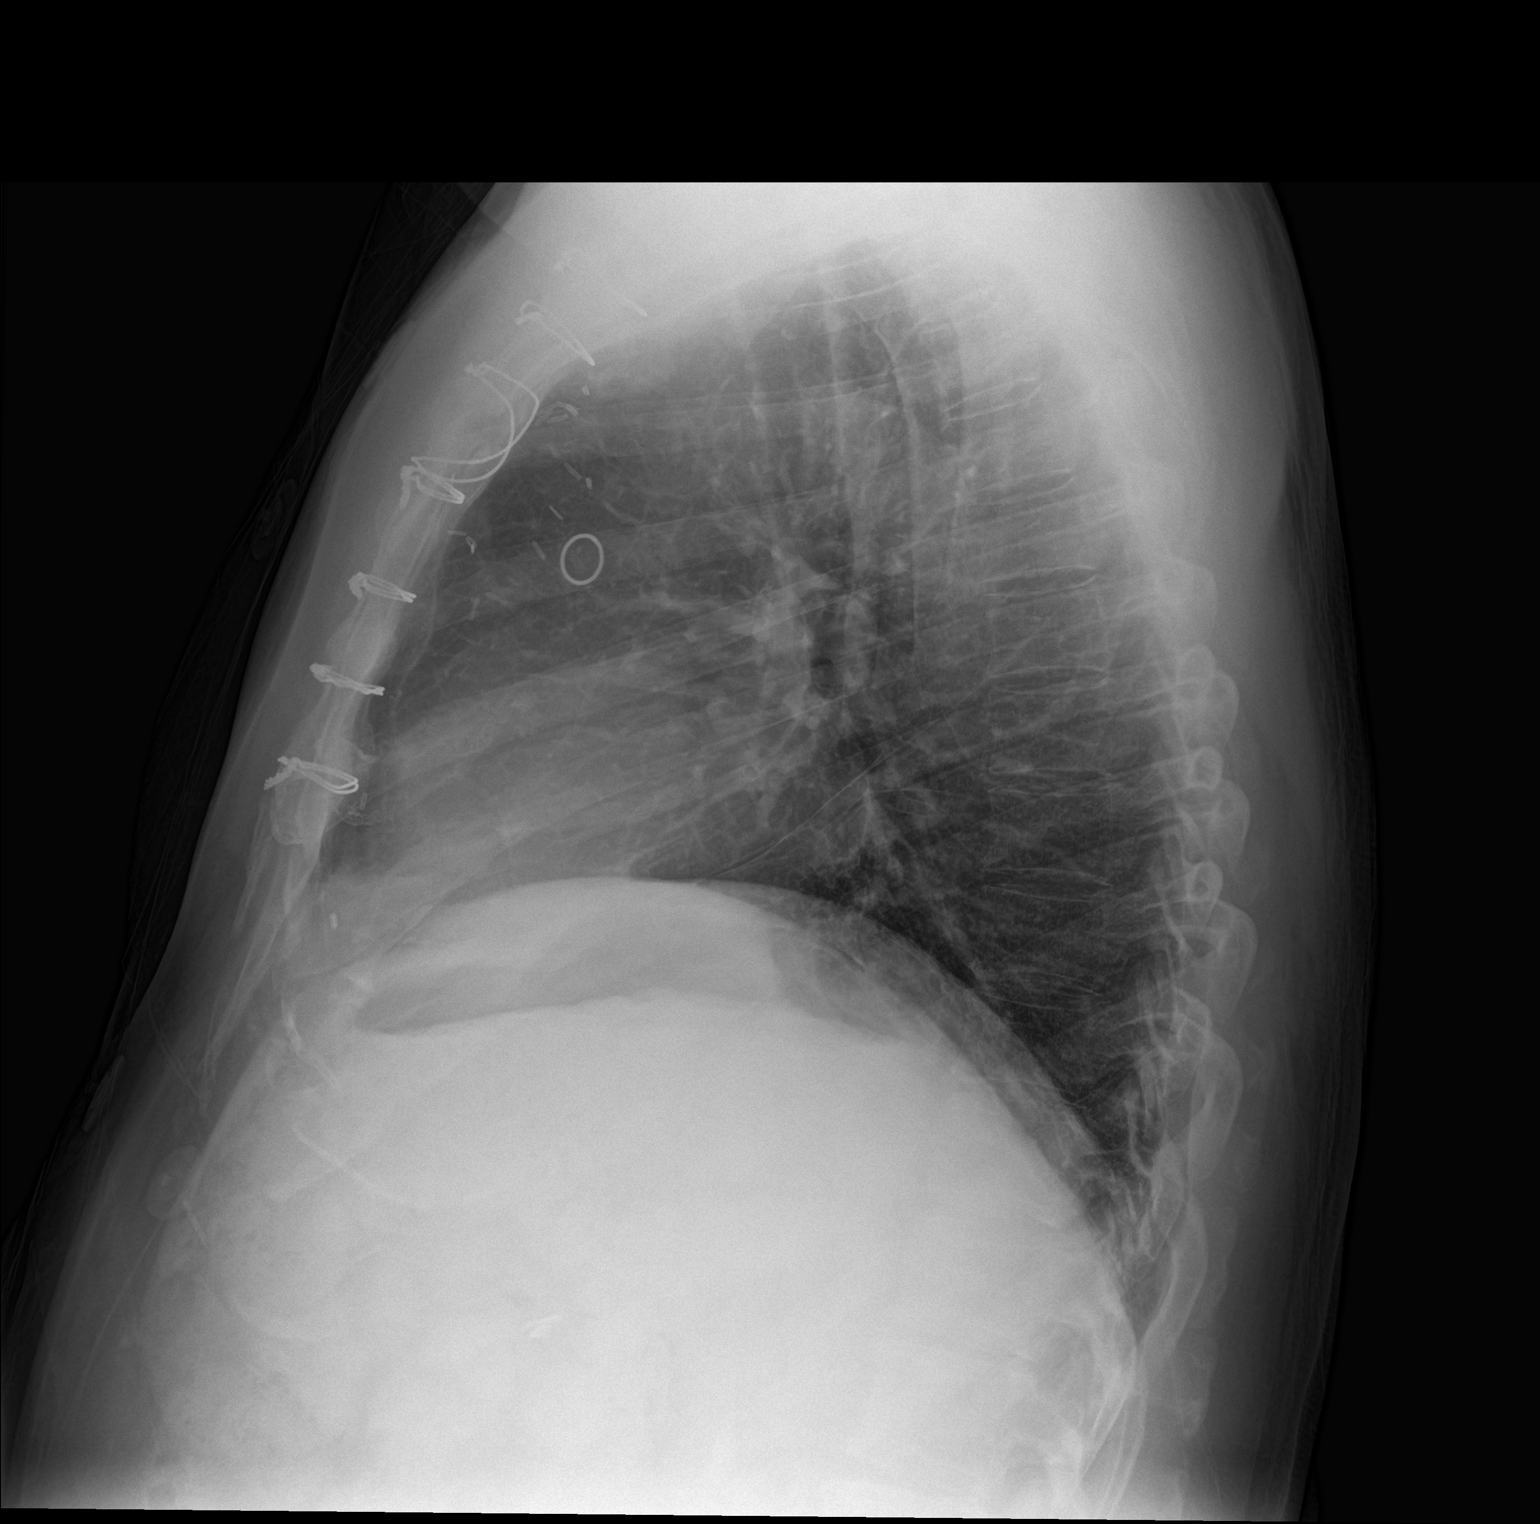

[2 of 2 positions shown; findings below may reference images not displayed]

FINDINGS: Heart size is upper normal. Median sternotomy wires in place,
appearing intact and appropriately positioned. Lungs are clear. No
evidence of pneumonia. No pleural effusion. No pneumothorax seen. No
acute-appearing osseous abnormality.
IMPRESSION: Lungs are clear and there is no evidence of acute cardiopulmonary
abnormality.

## 2017-08-27 IMAGING — CT CT ANGIO CHEST
2 of 7 series · 16 of 46 positions shown · IV contrast (omnipaque)
Comparison: None.

CLINICAL DATA: Chest pain since last evening.

EXAM:
CT ANGIOGRAPHY CHEST WITH CONTRAST
TECHNIQUE: Multidetector CT imaging of the chest was performed using the
standard protocol during bolus administration of intravenous
contrast. Multiplanar CT image reconstructions and MIPs were
obtained to evaluate the vascular anatomy.
CONTRAST:  75mL OMNIPAQUE IOHEXOL 350 MG/ML SOLN

[Series 5: pe thins 1.5 · axial · 0.68mm/px · z∈[-355,-84]mm · 13 of 264 slices shown]
[im 19/264  lung]
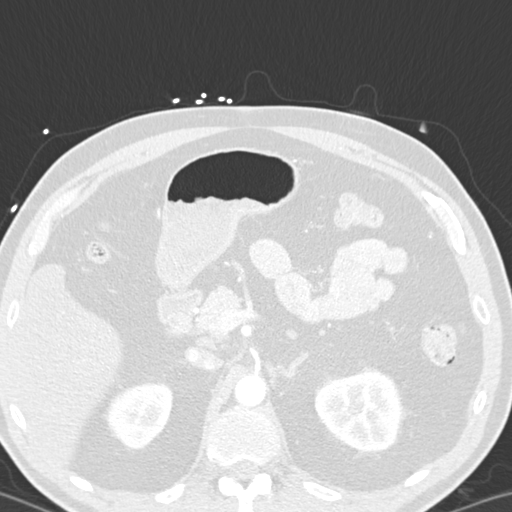
[im 38/264  soft-tissue]
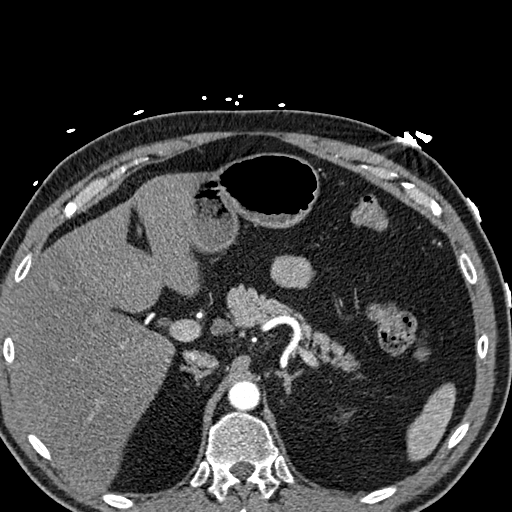
[im 57/264  lung]
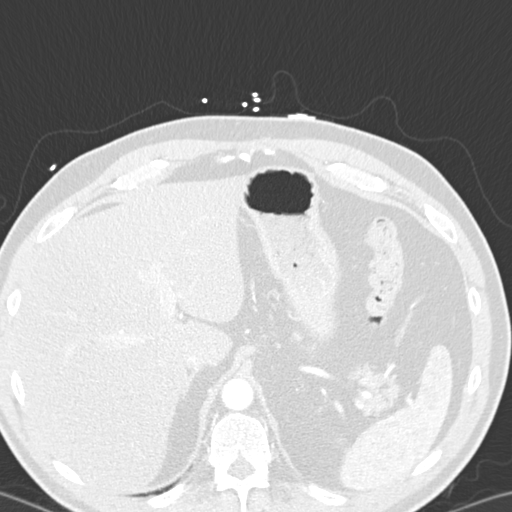
[im 76/264  soft-tissue]
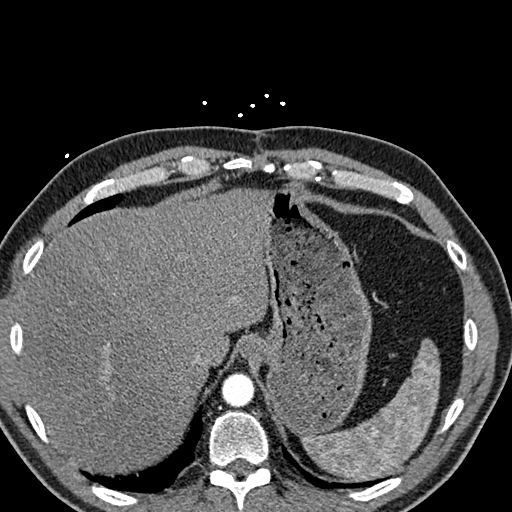
[im 94/264  lung]
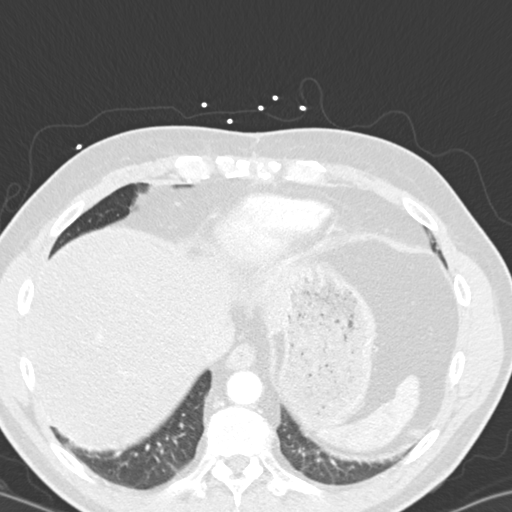
[im 113/264  soft-tissue]
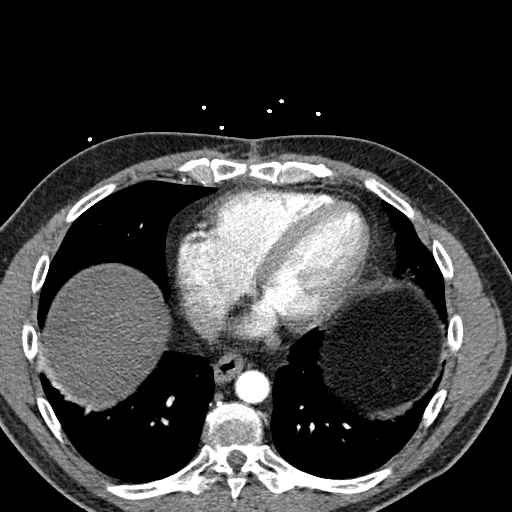
[im 132/264  lung]
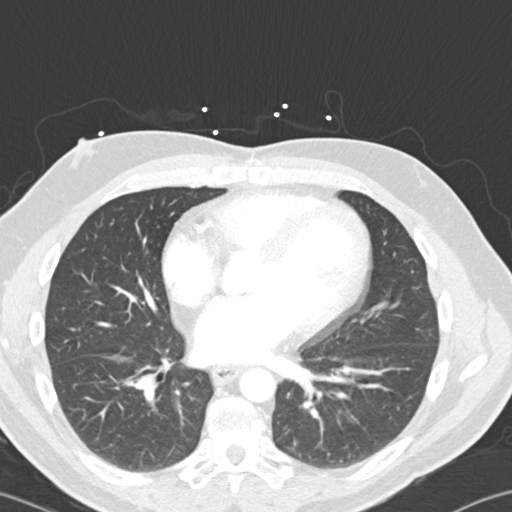
[im 151/264  soft-tissue]
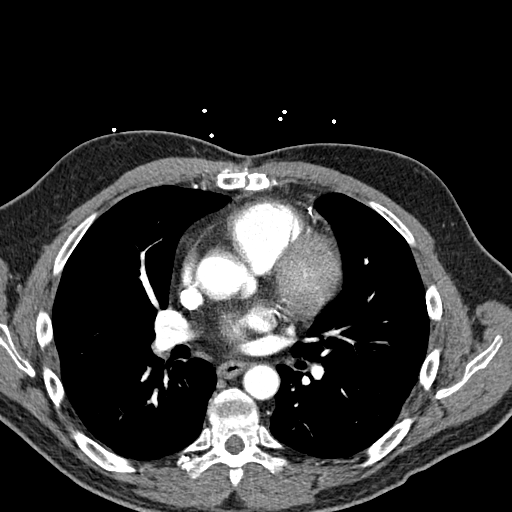
[im 170/264  lung]
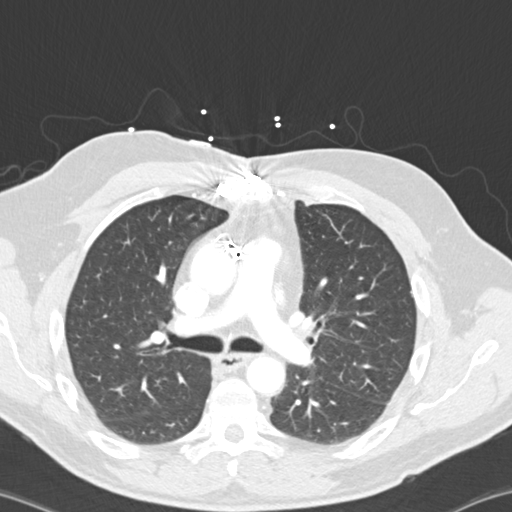
[im 188/264  soft-tissue]
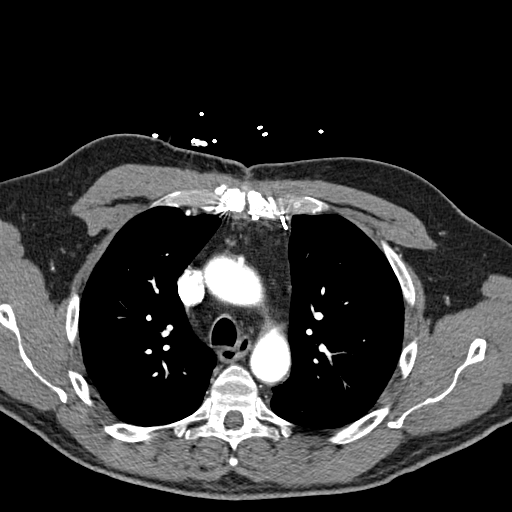
[im 207/264  lung]
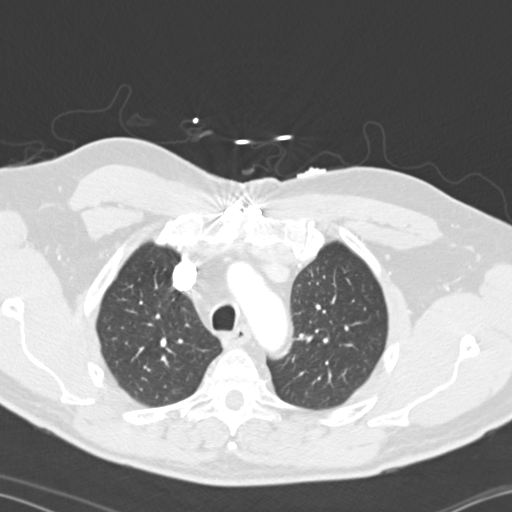
[im 226/264  soft-tissue]
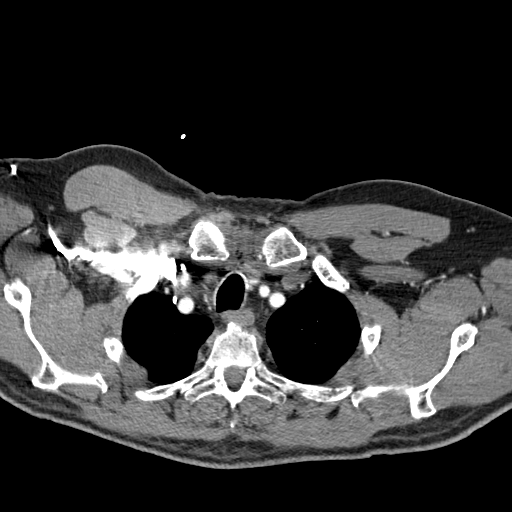
[im 245/264  lung]
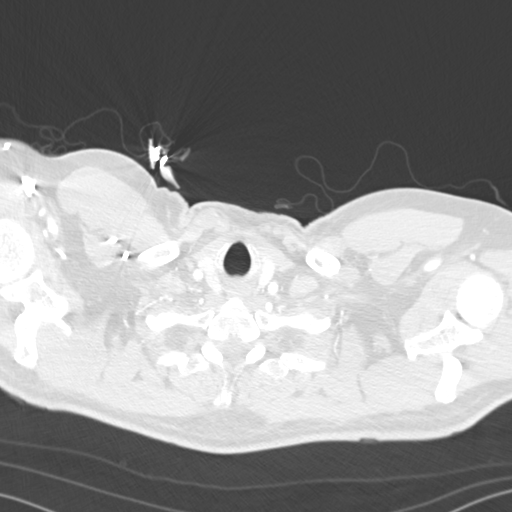

[Series 8: cor mpr 2.0 · coronal · 0.64mm/px · 3 of 161 slices shown]
[im 41/161  soft-tissue]
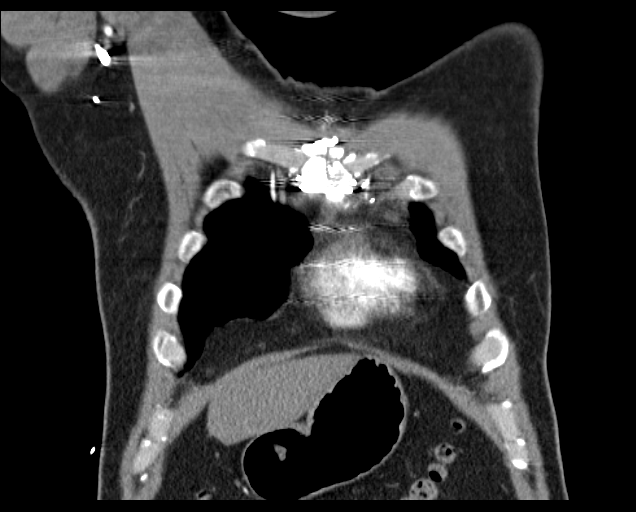
[im 81/161  soft-tissue]
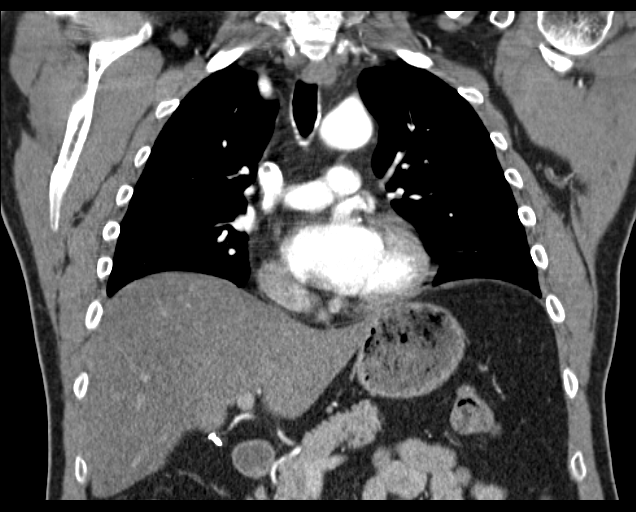
[im 121/161  soft-tissue]
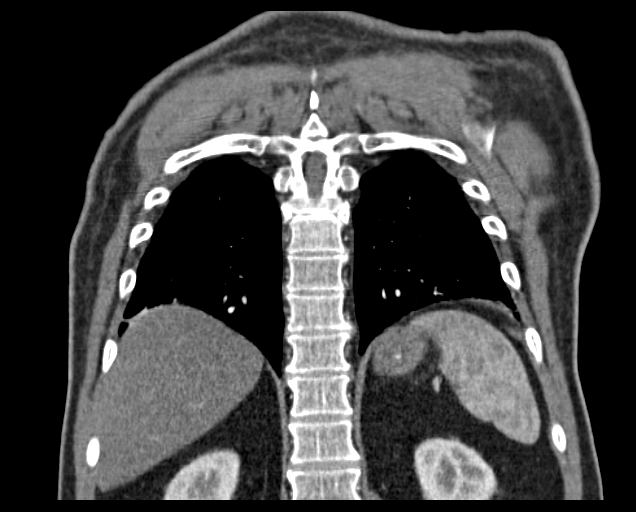

[16 of 46 positions shown; findings below may reference images not displayed]

FINDINGS: Mediastinum/Nodes: No chest wall mass, supraclavicular or axillary
lymphadenopathy. Small scattered lymph nodes are noted. The thyroid
gland appears normal.

The heart is normal in size. No pericardial effusion. The aorta is
normal in caliber. No dissection. The branch vessels are patent.
Surgical changes from bypass surgery. Coronary artery calcifications
are noted.

The pulmonary arterial tree is fairly well opacified. No definite
filling defects to suggest pulmonary embolism.

Small scattered mediastinal and hilar lymph nodes but no mass or
adenopathy. The esophagus is grossly normal.

Lungs/Pleura: No acute pulmonary findings. Minimal streaky dependent
subpleural atelectasis. No infiltrates, edema or effusions. No
worrisome pulmonary nodules. No bronchiectasis or interstitial lung
disease.

Upper abdomen: Severe and diffuse fatty infiltration of the liver.
Status postcholecystectomy.

Musculoskeletal: No significant bony findings. A few scattered
benign hemangiomas. Cervical spine fusion hardware noted.

Review of the MIP images confirms the above findings.
IMPRESSION: 1. No CT findings for pulmonary embolism.
2. No mediastinal or hilar mass or adenopathy.
3. Surgical changes from coronary artery bypass surgery.
4. No acute pulmonary findings.
5. Stable diffuse fatty infiltration of the liver.

## 2017-08-31 ENCOUNTER — Encounter: Payer: Self-pay | Admitting: Cardiovascular Disease

## 2017-08-31 ENCOUNTER — Ambulatory Visit (INDEPENDENT_AMBULATORY_CARE_PROVIDER_SITE_OTHER): Admitting: Cardiovascular Disease

## 2017-08-31 VITALS — BP 128/78 | HR 69 | Ht 67.0 in | Wt 231.5 lb

## 2017-08-31 DIAGNOSIS — E7800 Pure hypercholesterolemia, unspecified: Secondary | ICD-10-CM

## 2017-08-31 DIAGNOSIS — I10 Essential (primary) hypertension: Secondary | ICD-10-CM

## 2017-08-31 DIAGNOSIS — I5032 Chronic diastolic (congestive) heart failure: Secondary | ICD-10-CM | POA: Diagnosis not present

## 2017-08-31 DIAGNOSIS — R0602 Shortness of breath: Secondary | ICD-10-CM

## 2017-08-31 DIAGNOSIS — I2510 Atherosclerotic heart disease of native coronary artery without angina pectoris: Secondary | ICD-10-CM | POA: Diagnosis not present

## 2017-08-31 NOTE — Progress Notes (Signed)
Cardiology Office Note   Date:  08/31/2017   ID:  Jonathon Newman, DOB 02/28/1956, MRN [id number]  PCP:  Margaretann LovelessBurnette, [name], PA-C  Cardiologist:   [name], MD   Chief Complaint  Patient presents with  . other    2 month f/u no complaints today. Meds reviewed verbally with pt.      History of Present Illness: Jonathon Newman is a 62 y.o. male who is here today for follow-up visit regarding coronary artery disease and chronic diastolic heart failure.  He is retired from [employer] and used to get his care at the [facility] but no longer eligible.  He has known history of coronary artery disease with previous Cypher drug-eluting stent placement to the RCA in 2010.  Patient subsequently had two-vessel coronary artery [facility].  Other chronic medical conditions include hyperlipidemia, chronic diastolic heart failure, previous tobacco use and hypertension.   He was seen recently for atypical chest pain and exertional dyspnea.  He underwent a pharmacologic nuclear stress test which showed no evidence of ischemia with normal ejection fraction.  He reports improvement in symptoms overall but continues to have mild dyspnea and leg edema that is worse at the end of the day.  He takes torsemide 40 mg in the morning and 20 mg in the afternoon.  Past Medical History:  Diagnosis Date  . Arthritis   . CHF (congestive heart failure) (HCC)   . Coronary artery disease   . GERD (gastroesophageal reflux disease)   . Hypercholesteremia   . Hypertension   . S/P coronary artery bypass graft x 2   . Sleep apnea     Past Surgical History:  Procedure Laterality Date  . APPENDECTOMY    . CARDIAC CATHETERIZATION    . CARPAL TUNNEL RELEASE     left hand  . CHOLECYSTECTOMY    . CHOLECYSTECTOMY    . CORONARY ARTERY BYPASS GRAFT  04/21/2015   CABG x 2 [place] [facility]   . CORONARY STENT PLACEMENT  2010   Cordis Cypher Sirolimus-eluting stent 2.50 mm x 26 mm [facility]   . ESOPHAGEAL MANOMETRY N/A 08/04/2017   Procedure: ESOPHAGEAL MANOMETRY (EM);  Surgeon: Toney ReilVanga, Rohini Reddy, MD;  Location: [facility] ENDOSCOPY;  Service: Endoscopy;  Laterality: N/A;  . FRACTURE SURGERY    . KNEE SURGERY    . KNEE SURGERY     right knee   . VASECTOMY       Current Outpatient Medications  Medication Sig Dispense Refill  . amLODipine (NORVASC) 5 MG tablet Take 5 mg by mouth daily.    Marland Kitchen. aspirin 81 MG tablet Take 81 mg by mouth daily.    Marland Kitchen. atorvastatin (LIPITOR) 80 MG tablet Take 80 mg by mouth daily.    . carvedilol (COREG) 25 MG tablet Take 25 mg by mouth 2 (two) times daily with a meal.    . losartan (COZAAR) 100 MG tablet Take 100 mg by mouth daily.    . pantoprazole (PROTONIX) 20 MG tablet Take 40 mg by mouth 2 (two) times daily.     . Sennosides (SENNA-EX PO) Take by mouth as needed.    . sildenafil (VIAGRA) 50 MG tablet Take 50 mg by mouth as needed for erectile dysfunction.    Marland Kitchen. spironolactone (ALDACTONE) 25 MG tablet Take 25 mg by mouth daily.    . tamsulosin (FLOMAX) 0.4 MG CAPS capsule Take 0.8 mg by  mouth daily after breakfast.    . tiZANidine (ZANAFLEX) 2 MG tablet tizanidine 2 mg tablet  Take 1 tablet every 6 hours by oral route.    . torsemide (DEMADEX) 20 MG tablet Take 20 mg by mouth 3 (three) times daily.     No current facility-administered medications for this visit.     Allergies:   Patient has no known allergies.    Social History:  The patient  reports that he quit smoking about 12 years ago. His smoking use included cigarettes. He has a 0.50 pack-year smoking history. He has quit using smokeless tobacco. His smokeless tobacco use included chew. He reports that he does not drink alcohol or use drugs.   Family History:  [name] family history is negative for coronary artery disease.   ROS:  Please see the history of present illness.   Otherwise, review of systems are positive for none.   All other systems are  reviewed and negative.    PHYSICAL EXAM: VS:  BP 128/78 (BP Location: Left Arm, Patient Position: Sitting, Cuff Size: Large)   Pulse 69   Ht 5\' 7"  (1.702 m)   Wt 231 lb 8 oz (105 kg)   BMI 36.26 kg/m  , BMI Body mass index is 36.26 kg/m. GEN: Well nourished, well developed, in no acute distress  HEENT: normal  Neck: no JVD, carotid bruits, or masses Cardiac: RRR; no murmurs, rubs, or gallops, mild bilateral leg edema  Respiratory:  clear to auscultation bilaterally, normal work of breathing GI: soft, nontender, nondistended, + BS MS: no deformity or atrophy  Skin: warm and dry, no rash Neuro:  Strength and sensation are intact Psych: euthymic mood, full affect   EKG:  EKG is ordered today. The ekg ordered today demonstrates normal sinus rhythm with PACs and nonspecific ST and T wave changes.  Recent Labs: No results found for requested labs within last 8760 hours.    Lipid Panel No results found for: CHOL, TRIG, HDL, CHOLHDL, VLDL, LDLCALC, LDLDIRECT    Wt Readings from Last 3 Encounters:  08/31/17 231 lb 8 oz (105 kg)  08/24/17 231 lb 3.2 oz (104.9 kg)  08/17/17 225 lb 1.6 oz (102.1 kg)       PAD Screen 06/29/2017  Previous PAD dx? No  Previous surgical procedure? No  Pain with walking? Yes  Subsides with rest? No  Feet/toe relief with dangling? No  Painful, non-healing ulcers? No  Extremities discolored? No      ASSESSMENT AND PLAN:  1.  Coronary artery disease involving native coronary arteries with atypical angina: Recent nuclear stress test showed no evidence of ischemia with normal ejection fraction.  Symptoms improved.  Continue medical therapy.  2.  Chronic diastolic heart failure: He continues to report dyspnea.  I requested BNP.  He is also due for routine labs which were requested today.  An echocardiogram can be considered if symptoms persist.  3.  Essential hypertension: Blood pressure is controlled.  4.  Hyperlipidemia: Continue treatment  with atorvastatin with a target LDL of less than 70.  I requested lipid and liver profile.    Disposition:   FU with me in 6 months  Signed,  Lorine Bears, MD  08/31/2017 4:13 PM    [facility]

## 2017-08-31 NOTE — Patient Instructions (Signed)
Medication Instructions:  Your physician recommends that you continue on your current medications as directed. Please refer to the Current Medication list given to you today.   Labwork: Fasting lipid and liver profile, BNP[facility]. Nothing to eat or drink after midnight before your labs. No appointment needed.   Testing/Procedures: none  Follow-Up: Your physician wants you to follow-up in: 6 months with Dr. Kirke CorinArida.  You will receive a reminder letter in the mail two months in advance. If you don't receive a letter, please call our office to schedule the follow-up appointment.   Any Other Special Instructions Will Be Listed Below (If Applicable).     If you need a refill on your cardiac medications before your next appointment, please call your pharmacy.

## 2017-09-08 ENCOUNTER — Other Ambulatory Visit
Admission: RE | Admit: 2017-09-08 | Discharge: 2017-09-08 | Disposition: A | Discharge status: Home / Self Care | Source: Ambulatory Visit | Attending: Cardiovascular Disease | Admitting: Cardiovascular Disease

## 2017-09-08 DIAGNOSIS — I5032 Chronic diastolic (congestive) heart failure: Secondary | ICD-10-CM | POA: Insufficient documentation

## 2017-09-08 DIAGNOSIS — R0602 Shortness of breath: Secondary | ICD-10-CM | POA: Diagnosis present

## 2017-09-08 DIAGNOSIS — E7800 Pure hypercholesterolemia, unspecified: Secondary | ICD-10-CM | POA: Insufficient documentation

## 2017-09-08 LAB — BASIC METABOLIC PANEL
ANION GAP: 13 (ref 5–15)
BUN: 18 mg/dL (ref 6–20)
CO2: 24 mmol/L (ref 22–32)
Calcium: 8.9 mg/dL (ref 8.9–10.3)
Chloride: 102 mmol/L (ref 101–111)
Creatinine, Ser: 1.04 mg/dL (ref 0.61–1.24)
GFR calc Af Amer: >60 mL/min (ref >60)
Glucose, Bld: 135 mg/dL — ABNORMAL HIGH (ref 65–99)
POTASSIUM: 3.6 mmol/L (ref 3.5–5.1)
SODIUM: 139 mmol/L (ref 135–145)

## 2017-09-08 LAB — HEPATIC FUNCTION PANEL
ALT: 77 U/L — AB (ref 17–63)
AST: 50 U/L — AB (ref 15–41)
Albumin: 4.3 g/dL (ref 3.5–5.0)
Alkaline Phosphatase: 75 U/L (ref 38–126)
Bilirubin, Direct: <0.1 mg/dL — ABNORMAL LOW (ref 0.1–0.5)
Total Bilirubin: 0.9 mg/dL (ref 0.3–1.2)
Total Protein: 7.5 g/dL (ref 6.5–8.1)

## 2017-09-08 LAB — LIPID PANEL
Cholesterol: 160 mg/dL (ref 0–200)
HDL: 29 mg/dL — AB (ref >40)
LDL CALC: 54 mg/dL (ref 0–99)
Total CHOL/HDL Ratio: 5.5 RATIO
Triglycerides: 386 mg/dL — ABNORMAL HIGH (ref <150)
VLDL: 77 mg/dL — ABNORMAL HIGH (ref 0–40)

## 2017-09-08 LAB — BRAIN NATRIURETIC PEPTIDE: B NATRIURETIC PEPTIDE 5: 8 pg/mL (ref 0.0–100.0)

## 2017-09-09 ENCOUNTER — Other Ambulatory Visit: Payer: Self-pay

## 2017-09-09 DIAGNOSIS — Z79899 Other long term (current) drug therapy: Secondary | ICD-10-CM

## 2017-09-09 DIAGNOSIS — E7800 Pure hypercholesterolemia, unspecified: Secondary | ICD-10-CM

## 2017-09-09 MED ORDER — ATORVASTATIN CALCIUM 40 MG PO TABS: 40.0000 mg | ORAL_TABLET | Freq: Every day | ORAL | 3 refills | Status: DC

## 2017-09-09 MED ORDER — ICOSAPENT ETHYL 1 G PO CAPS: 2.0000 g | ORAL_CAPSULE | Freq: Two times a day (BID) | ORAL | 3 refills | Status: DC

## 2017-09-13 ENCOUNTER — Telehealth: Payer: Self-pay | Admitting: Cardiovascular Disease

## 2017-09-13 NOTE — Telephone Encounter (Signed)
The patient walked in to the office today to discuss Vascepa. He was started on this last week (3/28) by Dr. Kirke CorinArida.  He did pick up his samples, but states he went to get the RX at the pharmacy and this was $375 with a discount. He looked at what [facility] had to offer and this was $245.  He states he is just not going to pay that for a prescription. He reports he has two incomes as he was in [employer], but this is still a lot of money for him. He also states that the medication does have a "fishy" after taste.   I inquired if he has every taken an OTC fish oil and he states he did this in the past, but did have the "fishy" after taste with this as well. I advised him that some brands are worse than others for this and that putting it in the freezer sometimes makes a difference. He is aware there is no generic for Vascepa, but I will forward a message to Dr. Kirke CorinArida to review.  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   He is agreeable with taking the rest of the Vascepa samples and aware that we will call him back with further recommendations from Dr. Kirke CorinArida.

## 2017-09-13 NOTE — Telephone Encounter (Signed)
There is no alternative to Vascepa. Check with the drug rep to see what his options are. Otherwise, will have to hold off if it costs him that much.

## 2017-09-13 NOTE — Telephone Encounter (Signed)
Patient in lobby Has questions on new medication lcosapent Ethyl (VASCEPA), would like to know if there is a generic version or another suggestion Please discuss

## 2017-09-14 ENCOUNTER — Other Ambulatory Visit: Payer: Self-pay

## 2017-09-14 ENCOUNTER — Telehealth: Payer: Self-pay | Admitting: Cardiovascular Disease

## 2017-09-14 MED ORDER — ICOSAPENT ETHYL 1 G PO CAPS: 2.0000 g | ORAL_CAPSULE | Freq: Two times a day (BID) | ORAL | 3 refills | Status: DC

## 2017-09-14 NOTE — Telephone Encounter (Signed)
[facility] went to [facility] for [facility]. Told by pharmacy he would need PA.  Will call [facility] for info

## 2017-09-14 NOTE — Telephone Encounter (Signed)
[facility]'s vas[facility]. PA submitted through CoverMyMeds- PA obtained. I called [facility] at [phone] and left a message that PA has been obtained and to please call back with any questions.

## 2017-09-14 NOTE — Telephone Encounter (Signed)
I spoke with patient regarding Vascepa.  [facility] that medication cost $345/month. He is concerned as he doesn't think triglycerides have been that high in the past.   He reports being careful with sugar, sugary foods, and carb intake. Reviewed ways to improve triglyceride numbers.  We also discussed [employer] and I have provided information to him.  Samples at front desk awaiting pick up.  Samples of Vascepa were given to the patient, quantity 6, Lot Number [id number] Exp: 12/21  [facility] then [facility] with [facility].  They do not accept his [employer] insurance ([employer]) and the price quoted was using a discount card.   [facility] accepts [employer]. Notified patient, prescription sent. He will call with new price.

## 2017-09-14 NOTE — Telephone Encounter (Signed)
Patient came by  Would like to speak to Laurita QuintSharon Y. about medication States the best place to get in contact with him today will be at work [phone] ext. [phone]

## 2017-09-14 NOTE — Telephone Encounter (Signed)
Called patient regarding affordability of vascepa. Left message for patient to return call.

## 2017-09-14 NOTE — Telephone Encounter (Signed)
[facility] Cost $28/month. Notified patient who was appreciative and will pick it up tonight.

## 2017-09-22 ENCOUNTER — Telehealth: Payer: Self-pay | Admitting: Physician Assistant

## 2017-09-22 NOTE — Telephone Encounter (Signed)
Faxed [facility]to [facility] [phone] on 06/11/2017

## 2017-10-18 ENCOUNTER — Encounter: Payer: Self-pay | Admitting: Cardiovascular Disease

## 2017-10-18 DIAGNOSIS — Z951 Presence of aortocoronary bypass graft: Secondary | ICD-10-CM | POA: Insufficient documentation

## 2017-10-26 ENCOUNTER — Telehealth: Payer: Self-pay | Admitting: Cardiovascular Disease

## 2017-10-26 NOTE — Telephone Encounter (Signed)
Patient last seen 08/31/17 by Dr. Kirke Corin.  To MD to review for clearance.

## 2017-10-26 NOTE — Telephone Encounter (Signed)
   [facility] Pre-operative Risk Assessment    Request for surgical clearance:  1. What type of surgery is being performed? Hemilaminectomy  2. When is this surgery scheduled? 11/04/2017  3. What type of clearance is required (medical clearance vs. Pharmacy clearance to hold med vs. Both)? Not listed  4. Are there any medications that need to be held prior to surgery and how long?not listed  5. Practice name and name of physician performing surgery? [facility]  Dr. Marcello Moores Dimmig  6. What is your office phone number [phone]   7.   What is your office fax number [phone]  8.   Anesthesia type (None, local, MAC, general) ? general   Jonathon Newman 10/26/2017, 10:03 AM  _________________________________________________________________   (provider comments below)

## 2017-10-27 NOTE — Telephone Encounter (Signed)
Low risk.  Do not hold Aspirin 81 mg.

## 2017-10-29 NOTE — Telephone Encounter (Signed)
Clearance routed to number listed. 

## 2017-11-01 HISTORY — PX: BACK SURGERY: SHX140

## 2017-12-03 ENCOUNTER — Encounter: Payer: Self-pay | Admitting: Physician Assistant

## 2018-02-02 ENCOUNTER — Encounter: Payer: Self-pay | Admitting: Physician Assistant

## 2018-02-02 ENCOUNTER — Other Ambulatory Visit: Payer: Self-pay

## 2018-02-02 ENCOUNTER — Ambulatory Visit (INDEPENDENT_AMBULATORY_CARE_PROVIDER_SITE_OTHER): Admitting: Physician Assistant

## 2018-02-02 VITALS — BP 150/90 | HR 67 | Temp 98.7°F | Resp 16 | Ht 67.0 in | Wt 224.0 lb

## 2018-02-02 DIAGNOSIS — M5416 Radiculopathy, lumbar region: Secondary | ICD-10-CM

## 2018-02-02 DIAGNOSIS — E785 Hyperlipidemia, unspecified: Secondary | ICD-10-CM

## 2018-02-02 DIAGNOSIS — N401 Enlarged prostate with lower urinary tract symptoms: Secondary | ICD-10-CM | POA: Diagnosis not present

## 2018-02-02 DIAGNOSIS — I509 Heart failure, unspecified: Secondary | ICD-10-CM

## 2018-02-02 DIAGNOSIS — Z114 Encounter for screening for human immunodeficiency virus [HIV]: Secondary | ICD-10-CM

## 2018-02-02 DIAGNOSIS — R3912 Poor urinary stream: Secondary | ICD-10-CM

## 2018-02-02 DIAGNOSIS — Z0000 Encounter for general adult medical examination without abnormal findings: Secondary | ICD-10-CM

## 2018-02-02 DIAGNOSIS — I2510 Atherosclerotic heart disease of native coronary artery without angina pectoris: Secondary | ICD-10-CM

## 2018-02-02 DIAGNOSIS — Z1159 Encounter for screening for other viral diseases: Secondary | ICD-10-CM

## 2018-02-02 DIAGNOSIS — Z6835 Body mass index (BMI) 35.0-35.9, adult: Secondary | ICD-10-CM

## 2018-02-02 DIAGNOSIS — G4733 Obstructive sleep apnea (adult) (pediatric): Secondary | ICD-10-CM | POA: Diagnosis not present

## 2018-02-02 NOTE — Patient Instructions (Signed)

## 2018-02-02 NOTE — Progress Notes (Signed)
Patient: Jonathon Newman, Male    DOB: 01/24/1956, 62 y.o.   MRN: [id number] Visit Date: 02/02/2018  Today's Provider: Margaretann LovelessJennifer M Burnette, PA-C   Chief Complaint  Patient presents with  . Annual Exam   Subjective:    Annual physical exam Jonathon Newman is a 62 y.o. male who presents today for health maintenance and complete physical. He feels well. He reports exercising none. He reports he is sleeping well.  He reports back pain has been doing better since Surgery with Dr. Iline Ovenimmig.   Also followed by Dr. Kirke CorinArida for cardiovascular disease. Reports doing well. No symptoms.   As for abdominal bloating, they attempted to do esophageal manometry but was unable to gain access after three different people attempted to place the NG tube. He reports symptoms are stable and he has not followed up since.   Urinary symptoms are also stable. Was seen by Dr. Lonna CobbStoioff in 08/2017. Was referred to another provider. Had elevated PSA and underwent prostate biopsy two weeks ago. Results were negative. Will undergo Urolift procedure and is hoping this will help with urinary retention.  -----------------------------------------------------------------   Review of Systems  Constitutional: Negative.   HENT: Negative.   Eyes: Negative.   Respiratory: Negative.   Cardiovascular: Negative.   Gastrointestinal: Positive for abdominal distention.  Endocrine: Positive for heat intolerance.  Genitourinary: Positive for difficulty urinating.  Musculoskeletal: Positive for back pain.  Skin: Negative.   Allergic/Immunologic: Negative.   Neurological: Negative.   Hematological: Negative.   Psychiatric/Behavioral: Negative.     Social History      He  reports that he quit smoking about 12 years ago. His smoking use included cigarettes. He has a 0.50 pack-year smoking history. He has quit using smokeless tobacco.  His smokeless tobacco use included chew. He reports that he does not drink alcohol or use  drugs.       Social History   Socioeconomic History  . Marital status: Married    Spouse name: Not on file  . Number of children: Not on file  . Years of education: Not on file  . Highest education level: Not on file  Occupational History  . Not on file  Social Needs  . Financial resource strain: Not on file  . Food insecurity:    Worry: Not on file    Inability: Not on file  . Transportation needs:    Medical: Not on file    Non-medical: Not on file  Tobacco Use  . Smoking status: Former Smoker    Packs/day: 0.25    Years: 2.00    Pack years: 0.50    Types: Cigarettes    Last attempt to quit: 05/26/2005    Years since quitting: 12.6  . Smokeless tobacco: Former NeurosurgeonUser    Types: Chew  Substance and Sexual Activity  . Alcohol use: No  . Drug use: No  . Sexual activity: Not on file  Lifestyle  . Physical activity:    Days per week: Not on file    Minutes per session: Not on file  . Stress: Not on file  Relationships  . Social connections:    Talks on phone: Not on file    Gets together: Not on file    Attends religious service: Not on file    Active member of club or organization: Not on file    Attends meetings of clubs or organizations: Not on file    Relationship status: Not on file  Other  Topics Concern  . Not on file  Social History Narrative  . Not on file    Past Medical History:  Diagnosis Date  . Arthritis   . CHF (congestive heart failure) (HCC)   . Coronary artery disease    PCI and Cypher drug-eluting stent placement to the right coronary artery in 2010.  NSTEMI in 2016.  Cardiac catheterization showed patent RCA stent, significant distal left main disease with an FFR ratio of 0.76 and ostial left circumflex stenosis.  Underwent CABG with LIMA to LAD and SVG to OM 2.  . GERD (gastroesophageal reflux disease)   . Hypercholesteremia   . Hypertension   . S/P coronary artery bypass graft x 2   . Sleep apnea      Patient Active Problem List    Diagnosis Date Noted  . S/P coronary artery bypass graft x 2   . Incomplete bladder emptying 07/21/2017  . Low back pain 07/09/2017  . Lumbar radiculopathy 07/09/2017  . Gastroesophageal reflux disease with esophagitis 07/05/2017  . Congestive heart failure (HCC) 07/05/2017  . Benign prostatic hyperplasia with weak urinary stream 07/05/2017  . Dysphagia 07/05/2017  . DDD (degenerative disc disease), lumbar 07/05/2017  . CAD (coronary artery disease) 07/31/2015  . Benign essential HTN 07/31/2015  . Hyperlipidemia 07/31/2015  . OSA (obstructive sleep apnea) 07/31/2015    Past Surgical History:  Procedure Laterality Date  . APPENDECTOMY    . BACK SURGERY  11/01/2017   SL 5 and S1  . CARDIAC CATHETERIZATION    . CARPAL TUNNEL RELEASE     left hand  . CHOLECYSTECTOMY    . CHOLECYSTECTOMY    . CORONARY ARTERY BYPASS GRAFT  04/21/2015   CABG x 2 [place] [facility] LIMA to LAD amd SVG to OM2  . CORONARY STENT PLACEMENT  2010   Cordis Cypher Sirolimus-eluting stent 2.50 mm x 26 mm placed to the RCA at [facility]   . ESOPHAGEAL MANOMETRY N/A 08/04/2017   Procedure: ESOPHAGEAL MANOMETRY (EM);  Surgeon: Toney Reil, MD;  Location: [facility] ENDOSCOPY;  Service: Endoscopy;  Laterality: N/A;  . FRACTURE SURGERY    . KNEE SURGERY    . KNEE SURGERY     right knee   . VASECTOMY      Family History        Family Status  Relation Name Status  . Mother  Deceased  . Father  Deceased  . Brother  Deceased  . Brother  Alive  . MGM  (Not Specified)  . MGF  (Not Specified)  . PGF  (Not Specified)  . Neg Hx  (Not Specified)        His family history includes Arthritis in his mother; Cancer in his maternal grandfather; Cataracts in his maternal grandmother; Glaucoma in his maternal grandmother; Heart attack in his paternal grandfather; Heart attack (age of onset: 26) in his father; Heart murmur in his brother; Hyperlipidemia in his father; Hypertension in his father and  paternal grandfather; Valvular heart disease in his brother. There is no history of Prostate cancer, Bladder Cancer, or Kidney cancer.      Allergies  Allergen Reactions  . Lisinopril Cough     Current Outpatient Medications:  .  amLODipine (NORVASC) 5 MG tablet, Take 5 mg by mouth daily., Disp: , Rfl:  .  aspirin 81 MG tablet, Take 81 mg by mouth daily., Disp: , Rfl:  .  atorvastatin (LIPITOR) 40 MG tablet, Take 1 tablet (40 mg total) by  mouth daily., Disp: 90 tablet, Rfl: 3 .  carvedilol (COREG) 25 MG tablet, Take 25 mg by mouth 2 (two) times daily with a meal., Disp: , Rfl:  .  Icosapent Ethyl (VASCEPA) 1 g CAPS, Take 2 capsules (2 g total) by mouth 2 (two) times daily., Disp: 120 capsule, Rfl: 3 .  losartan (COZAAR) 100 MG tablet, Take 100 mg by mouth daily., Disp: , Rfl:  .  pantoprazole (PROTONIX) 20 MG tablet, Take 40 mg by mouth 2 (two) times daily. , Disp: , Rfl:  .  Sennosides (SENNA-EX PO), Take by mouth as needed., Disp: , Rfl:  .  sildenafil (VIAGRA) 50 MG tablet, Take 50 mg by mouth as needed for erectile dysfunction., Disp: , Rfl:  .  spironolactone (ALDACTONE) 25 MG tablet, Take 25 mg by mouth daily., Disp: , Rfl:  .  tamsulosin (FLOMAX) 0.4 MG CAPS capsule, Take 0.8 mg by mouth daily after breakfast., Disp: , Rfl:  .  torsemide (DEMADEX) 20 MG tablet, Take 20 mg by mouth 3 (three) times daily., Disp: , Rfl:  .  tiZANidine (ZANAFLEX) 2 MG tablet, tizanidine 2 mg tablet  Take 1 tablet every 6 hours by oral route., Disp: , Rfl:  .  traMADol (ULTRAM) 50 MG tablet, tramadol 50 mg tablet  Take 2 tablets every 6 hours by oral route., Disp: , Rfl:    Patient Care Team: Margaretann LovelessBurnette, [name], PA-C as PCP - General (Family Medicine)      Objective:   Vitals: BP (!) 150/90 (BP Location: Left Arm, Patient Position: Sitting, Cuff Size: Large)   Pulse 67   Temp 98.7 F (37.1 C) (Oral)   Resp 16   Ht 5\' 7"  (1.702 m)   Wt 224 lb (101.6 kg)   SpO2 99%   BMI 35.08 kg/m     Vitals:   02/02/18 1521  BP: (!) 150/90  Pulse: 67  Resp: 16  Temp: 98.7 F (37.1 C)  TempSrc: Oral  SpO2: 99%  Weight: 224 lb (101.6 kg)  Height: 5\' 7"  (1.702 m)     Physical Exam  Constitutional: He is oriented to person, place, and time. He appears well-developed and well-nourished.  HENT:  Head: Normocephalic and atraumatic.  Right Ear: Hearing, tympanic membrane, external ear and ear canal normal.  Left Ear: Hearing, tympanic membrane, external ear and ear canal normal.  Nose: Nose normal.  Mouth/Throat: Uvula is midline, oropharynx is clear and moist and mucous membranes are normal.  Eyes: Pupils are equal, round, and reactive to light. Conjunctivae and EOM are normal. Right eye exhibits no discharge.  Neck: Normal range of motion. Neck supple. Carotid bruit is not present. No tracheal deviation present. No thyromegaly present.  Cardiovascular: Normal rate, regular rhythm, normal heart sounds and intact distal pulses.  No murmur heard. Pulmonary/Chest: Effort normal and breath sounds normal. No respiratory distress. He has no wheezes. He has no rales. He exhibits no tenderness.    Abdominal: Soft. He exhibits no distension and no mass. There is no tenderness. There is no rebound and no guarding.  Genitourinary:  Genitourinary Comments: Deferred to Urology  Musculoskeletal: Normal range of motion. He exhibits edema (1-2+ pitting edema bilat). He exhibits no tenderness.  Lymphadenopathy:    He has no cervical adenopathy.  Neurological: He is alert and oriented to person, place, and time. He has normal reflexes. He displays normal reflexes. No cranial nerve deficit. He exhibits normal muscle tone. Coordination normal.  Skin: Skin is warm and dry.  No rash noted. No erythema.  Psychiatric: He has a normal mood and affect. His behavior is normal. Judgment and thought content normal.     Depression Screen PHQ 2/9 Scores 02/02/2018 06/30/2017 11/07/2015 07/31/2015  PHQ - 2  Score 0 0 1 4  PHQ- 9 Score 0 3 6 8       Assessment & Plan:     Routine Health Maintenance and Physical Exam  Exercise Activities and Dietary recommendations Goals   None     Immunization History  Administered Date(s) Administered  . Pneumococcal Polysaccharide-23 08/03/2012  . Tdap 08/03/2012    Health Maintenance  Topic Date Due  . Hepatitis C Screening  Jun 16, 1955  . HIV Screening  09/21/1971  . INFLUENZA VACCINE  01/13/2018  . COLONOSCOPY  05/26/2020  . TETANUS/TDAP  08/03/2022     Discussed health benefits of physical activity, and encouraged him to engage in regular exercise appropriate for his age and condition.    1. Annual physical exam Normal physical exam today. Will check labs as below and f/u pending lab results. If labs are stable and WNL he will not need to have these rechecked for one year at his next annual physical exam. He is to call the office in the meantime if he has any acute issue, questions or concerns. - TSH  2. Congestive heart failure, unspecified HF chronicity, unspecified heart failure type (HCC) Stable. Continue Losartan 100mg  daily, torsemide 20mg  TID, carvedilol 25mg  BID, spironolactone 25mg  daily, and atorvastatin 40mg . Also followed by Dr. Kirke Corin for medication management. Will check labs as below and f/u pending results. - CBC w/Diff/Platelet - Comprehensive Metabolic Panel (CMET) - TSH - Lipid Profile  3. OSA (obstructive sleep apnea) Stable. Continue CPAP.  - CBC w/Diff/Platelet - Comprehensive Metabolic Panel (CMET)  4. Benign prostatic hyperplasia with weak urinary stream Most recently had negative prostate biopsy. Will have Urolift procedure in near future per patient.  - CBC w/Diff/Platelet - Comprehensive Metabolic Panel (CMET) - TSH  5. Lumbar radiculopathy Stable. Followed by Dr. Iline Oven. Uses Tramadol at bedtime only prn for pain.  - CBC w/Diff/Platelet - Comprehensive Metabolic Panel (CMET)  6. Hyperlipidemia,  unspecified hyperlipidemia type Stable. Followed by Dr. Kirke Corin. Continue atorvastatin 40mg  and Vascepa 1g. Will check labs as below and f/u pending results. - CBC w/Diff/Platelet - Comprehensive Metabolic Panel (CMET) - TSH - Lipid Profile  7. Coronary artery disease involving native coronary artery of native heart without angina pectoris See above medical treatment plan for #2. - CBC w/Diff/Platelet - Comprehensive Metabolic Panel (CMET) - TSH - Lipid Profile  8. Screening for HIV without presence of risk factors - HIV antibody (with reflex)  9. Need for hepatitis C screening test - Hepatitis C Antibody  10. BMI 35.0-35.9,adult Counseled patient on healthy lifestyle modifications including dieting and exercise.   --------------------------------------------------------------------    Margaretann Loveless, PA-C  [facility]

## 2018-02-03 ENCOUNTER — Telehealth: Payer: Self-pay

## 2018-02-03 LAB — COMPREHENSIVE METABOLIC PANEL
ALK PHOS: 95 IU/L (ref 39–117)
ALT: 51 IU/L — AB (ref 0–44)
AST: 24 IU/L (ref 0–40)
Albumin/Globulin Ratio: 1.5 (ref 1.2–2.2)
Albumin: 4.5 g/dL (ref 3.6–4.8)
BUN/Creatinine Ratio: 14 (ref 10–24)
BUN: 15 mg/dL (ref 8–27)
Bilirubin Total: 0.3 mg/dL (ref 0.0–1.2)
CALCIUM: 9.5 mg/dL (ref 8.6–10.2)
CO2: 23 mmol/L (ref 20–29)
CREATININE: 1.04 mg/dL (ref 0.76–1.27)
Chloride: 100 mmol/L (ref 96–106)
GFR calc Af Amer: 89 mL/min/{1.73_m2} (ref >59)
GFR calc non Af Amer: 77 mL/min/{1.73_m2} (ref >59)
GLUCOSE: 97 mg/dL (ref 65–99)
Globulin, Total: 3 g/dL (ref 1.5–4.5)
Potassium: 3.4 mmol/L — ABNORMAL LOW (ref 3.5–5.2)
SODIUM: 143 mmol/L (ref 134–144)
Total Protein: 7.5 g/dL (ref 6.0–8.5)

## 2018-02-03 LAB — CBC WITH DIFFERENTIAL/PLATELET
BASOS: 0 %
Basophils Absolute: 0 10*3/uL (ref 0.0–0.2)
EOS (ABSOLUTE): 0.2 10*3/uL (ref 0.0–0.4)
Eos: 3 %
Hematocrit: 39.3 % (ref 37.5–51.0)
Hemoglobin: 13.6 g/dL (ref 13.0–17.7)
IMMATURE GRANULOCYTES: 0 %
Immature Grans (Abs): 0 10*3/uL (ref 0.0–0.1)
LYMPHS: 28 %
Lymphocytes Absolute: 1.9 10*3/uL (ref 0.7–3.1)
MCH: 30.8 pg (ref 26.6–33.0)
MCHC: 34.6 g/dL (ref 31.5–35.7)
MCV: 89 fL (ref 79–97)
MONOCYTES: 9 %
Monocytes Absolute: 0.6 10*3/uL (ref 0.1–0.9)
NEUTROS ABS: 4.1 10*3/uL (ref 1.4–7.0)
Neutrophils: 60 %
PLATELETS: 233 10*3/uL (ref 150–450)
RBC: 4.42 x10E6/uL (ref 4.14–5.80)
RDW: 14.8 % (ref 12.3–15.4)
WBC: 6.9 10*3/uL (ref 3.4–10.8)

## 2018-02-03 LAB — LIPID PANEL
CHOLESTEROL TOTAL: 185 mg/dL (ref 100–199)
Chol/HDL Ratio: 5.3 ratio — ABNORMAL HIGH (ref 0.0–5.0)
HDL: 35 mg/dL — ABNORMAL LOW (ref >39)
TRIGLYCERIDES: 692 mg/dL — AB (ref 0–149)

## 2018-02-03 LAB — HEPATITIS C ANTIBODY: Hep C Virus Ab: <0.1 s/co ratio (ref 0.0–0.9)

## 2018-02-03 LAB — TSH: TSH: 1.6 u[IU]/mL (ref 0.450–4.500)

## 2018-02-03 LAB — HIV ANTIBODY (ROUTINE TESTING W REFLEX): HIV Screen 4th Generation wRfx: NONREACTIVE

## 2018-02-03 NOTE — Telephone Encounter (Signed)
-----   Message from Margaretann LovelessJennifer M Burnette, PA-C sent at 02/03/2018  9:46 AM EDT ----- Blood count is normal. Kidney function normal. Liver enzymes improving. Sugar normal. Potassium borderline low. Recommend to increase potassium rich foods such as sweet potatoes, leafy greens (kale, spinach), bananas,etc. Would like to recheck in a few months to see if back to normal or still decreasing. If so may require potassium supplement as well with your diuretics. Thyroid is normal. Cholesterol ok, but triglycerides elevated at 692. This is most likely due to this not being a fasting lab. Make sure to continue atorvastatin and Vascepa for cholesterol control. Hep c negative. HIV screen negative.

## 2018-02-03 NOTE — Telephone Encounter (Signed)
Patient advised as below.  

## 2018-02-21 ENCOUNTER — Other Ambulatory Visit: Payer: Self-pay | Admitting: Physician Assistant

## 2018-02-21 DIAGNOSIS — I10 Essential (primary) hypertension: Secondary | ICD-10-CM

## 2018-02-21 DIAGNOSIS — I25118 Atherosclerotic heart disease of native coronary artery with other forms of angina pectoris: Secondary | ICD-10-CM

## 2018-02-21 MED ORDER — CARVEDILOL 25 MG PO TABS: 25.0000 mg | ORAL_TABLET | Freq: Two times a day (BID) | ORAL | 1 refills | Status: DC

## 2018-02-21 MED ORDER — LOSARTAN POTASSIUM 100 MG PO TABS: 100.0000 mg | ORAL_TABLET | Freq: Every day | ORAL | 1 refills | Status: DC

## 2018-02-21 NOTE — Telephone Encounter (Signed)
Patient states he will be out of the following medications by Sunday Sept. 15 th would like to be able to pick up medications at [facility] then.    losartan (COZAAR) 100 MG tablet  carvedilol (COREG) 25 MG tablet

## 2018-02-21 NOTE — Telephone Encounter (Signed)
refilled 

## 2018-03-15 ENCOUNTER — Other Ambulatory Visit: Payer: Self-pay | Admitting: Physician Assistant

## 2018-03-15 DIAGNOSIS — I10 Essential (primary) hypertension: Secondary | ICD-10-CM

## 2018-03-15 MED ORDER — AMLODIPINE BESYLATE 5 MG PO TABS: 5.0000 mg | ORAL_TABLET | Freq: Every day | ORAL | 1 refills | Status: DC

## 2018-03-15 NOTE — Telephone Encounter (Signed)
Pt is requesting a refill on the following medication. Pt use's Karin Golden.  amLODipine (NORVASC) 5 MG tablet

## 2018-04-13 ENCOUNTER — Ambulatory Visit (INDEPENDENT_AMBULATORY_CARE_PROVIDER_SITE_OTHER): Admitting: Cardiovascular Disease

## 2018-04-13 ENCOUNTER — Encounter: Payer: Self-pay | Admitting: Cardiovascular Disease

## 2018-04-13 ENCOUNTER — Telehealth: Payer: Self-pay | Admitting: Cardiovascular Disease

## 2018-04-13 VITALS — BP 158/84 | HR 60 | Ht 67.0 in | Wt 230.5 lb

## 2018-04-13 DIAGNOSIS — Z951 Presence of aortocoronary bypass graft: Secondary | ICD-10-CM | POA: Diagnosis not present

## 2018-04-13 DIAGNOSIS — R0602 Shortness of breath: Secondary | ICD-10-CM

## 2018-04-13 DIAGNOSIS — E785 Hyperlipidemia, unspecified: Secondary | ICD-10-CM

## 2018-04-13 DIAGNOSIS — R42 Dizziness and giddiness: Secondary | ICD-10-CM

## 2018-04-13 DIAGNOSIS — I5033 Acute on chronic diastolic (congestive) heart failure: Secondary | ICD-10-CM

## 2018-04-13 DIAGNOSIS — I25119 Atherosclerotic heart disease of native coronary artery with unspecified angina pectoris: Secondary | ICD-10-CM | POA: Diagnosis not present

## 2018-04-13 MED ORDER — TORSEMIDE 20 MG PO TABS: 40.0000 mg | ORAL_TABLET | Freq: Two times a day (BID) | ORAL | 6 refills | Status: DC

## 2018-04-13 MED ORDER — ISOSORBIDE MONONITRATE ER 30 MG PO TB24: 30.0000 mg | ORAL_TABLET | Freq: Every day | ORAL | 6 refills | Status: DC

## 2018-04-13 MED ORDER — METOLAZONE 5 MG PO TABS: 5.0000 mg | ORAL_TABLET | Freq: Every day | ORAL | 3 refills | Status: DC | PRN

## 2018-04-13 MED ORDER — POTASSIUM CHLORIDE ER 10 MEQ PO TBCR: 10.0000 meq | EXTENDED_RELEASE_TABLET | Freq: Two times a day (BID) | ORAL | 3 refills | Status: DC | PRN

## 2018-04-13 NOTE — Telephone Encounter (Signed)
Message left directly on triage nurse voice mail that the patient was trying to call back.  Cell- [phone] Work- [phone]

## 2018-04-13 NOTE — Patient Instructions (Addendum)
Medication Instructions:   Please start imdur/isosorbide one a day  Take torsemide 40 twice a day, 6 am and 2 pm  Take metolazone 5 mg for weight >230 Take metolazone 2.5 mg for weight over 227  Take potassium one a day (10 meq) On days when you take metolazone: take 3 potassium pill  If you need a refill on your cardiac medications before your next appointment, please call your pharmacy.    Lab work: No new labs needed   If you have labs (blood work) drawn today and your tests are completely normal, you will receive your results only by: Marland Kitchen MyChart Message (if you have MyChart) OR . A paper copy in the mail If you have any lab test that is abnormal or we need to change your treatment, we will call you to review the results.   Testing/Procedures: No new testing needed   Follow-Up: At [facility], you and your health needs are our priority.  As part of our continuing mission to provide you with exceptional heart care, we have created designated Provider Care Teams.  These Care Teams include your primary Cardiologist (physician) and Advanced Practice Providers (APPs -  Physician Assistants and Nurse Practitioners) who all work together to provide you with the care you need, when you need it.  . You will need a follow up appointment with Dr. Kirke Corin his APP in- 2 weeks  .   Please call our office 2 months in advance to schedule this appointment.    . Providers on your designated Care Team:   . Nicolasa Ducking, NP . Eula Listen, PA-C . Marisue Ivan, PA-C  Any Other Special Instructions Will Be Listed Below (If Applicable).  For educational health videos Log in to : [email or web address] Or : [email or web address], password : [email or web address]

## 2018-04-13 NOTE — Telephone Encounter (Signed)
I called and spoke with the patient. He reports an ~ 9 lb weight gain over the last 4 days. He feels like his face is "puffy", slightly R>L lower extremity edema, and some abdominal fullness.  He is only able to walk ~ 50-75 feet then he has to stop to rest. He is having some trouble sleeping at night. He is wearing his C-PAP.  He denies any change in medication/ diet/ fluid intake recently. He did notice a decrease in his urine output that started ~ 3 days ago.   He is currently taking torsemide 20 mg- 2 tablets (40 mg) in the AM & 1 tablet (20 mg) in the PM ~ 6 pm.   His BP's having been running higher the last few days with SBP ~ 170. HR has been in the 50's at times.   I have advised the patient that he should be evaluated in the office. He confirms he is not in acute distress. I have offered him an appointment at 3:40 pm today with Dr. Mariah Milling (DOD) for evaluation and he is agreeable. I have advised if he gets acutely worse in the interim, to please seek urgent care in the ER. The patient voices understanding.

## 2018-04-13 NOTE — Telephone Encounter (Signed)
Left message to call back on home number. Tried to speak with patient at work number that he provided but it was a business/company VM.  Did not leave message.

## 2018-04-13 NOTE — Telephone Encounter (Signed)
Pt c/o swelling: STAT is pt has developed SOB within 24 hours  1) How much weight have you gained and in what time span? Last 4 days almost 9 pounds  2) If swelling, where is the swelling located? All over, but mostly bilateral ankles, majority in right ankle, and some in stomach  3) Are you currently taking a fluid pill? yes  4) Are you currently SOB? Like a labor type breathing  5) Do you have a log of your daily weights (if so, list)?  Yes, but not with him now.  This morning he was 232  6) Have you gained 3 pounds in a day or 5 pounds in a week? Yes   7) Have you traveled recently? no  Pt states his BP this morning was 180/100 HR 65.  Pt states it is ok to leave a detailed message.  [phone] number if needed.

## 2018-04-13 NOTE — Progress Notes (Signed)
Cardiology Office Note  Date:  04/13/2018   ID:  Jonathon Newman, DOB 1955-07-12, MRN [id number]  PCP:  Margaretann Loveless, PA-C   Chief Complaint  Patient presents with  . other    9lb weight gain SOB and chest discomfort stabbing at times.Medications reviewed verbally.    HPI:  [name] is a 62 year old gentleman with past medical history of coronary artery disease  chronic diastolic heart failure.   Cypher drug-eluting stent placement to the RCA in 2010.    two-vessel coronary artery bypass graft in 2016 at [facility].   Hyperlipidemia Who presents for same-day add-on for acute on chronic diastolic CHF, 12 pound weight gain  Prior history reviewed with him in detail He has chronic dizziness, seems to present after turning his head moving position up and down. Was seen by ENT, told he did not have vertigo Has not tried meclizine  At baseline watches his weight daily, Baseline weight typically 220 pounds or so Weight in August 2 113 pounds Recent numbers show April 05, 2018 weight was 219 pounds Today weight is 231 pounds Denies missing doses of his torsemide Denies excessive fluid intake or salt intake, does not eat out much   He takes torsemide 40 mg in the morning and 20 mg in the afternoon. Occasionally takes extra torsemide before bed  Feels his face is puffy, hands are swollen, significant leg edema bilaterally pitting He is only able to walk ~ 50-75 feet then he has to stop to rest. He is having some trouble sleeping at night. He is wearing his C-PAP.  Blood pressure running high most of the time Typically 160 systolic often higher 180s in the afternoon This is been a chronic issue  EKG personally reviewed by myself on todays visit Shows normal sinus rhythm rate 60 bpm no significant ST or T wave changes  PMH:   has a past medical history of Arthritis, CHF (congestive heart failure) (HCC), Coronary artery disease, GERD (gastroesophageal reflux  disease), Hypercholesteremia, Hypertension, S/P coronary artery bypass graft x 2, and Sleep apnea.  PSH:    Past Surgical History:  Procedure Laterality Date  . APPENDECTOMY    . BACK SURGERY  11/01/2017   SL 5 and S1  . CARDIAC CATHETERIZATION    . CARPAL TUNNEL RELEASE     left hand  . CHOLECYSTECTOMY    . CHOLECYSTECTOMY    . CORONARY ARTERY BYPASS GRAFT  04/21/2015   CABG x 2 [place] V.A. LIMA to LAD amd SVG to OM2  . CORONARY STENT PLACEMENT  2010   Cordis Cypher Sirolimus-eluting stent 2.50 mm x 26 mm placed to the RCA at [facility]   . ESOPHAGEAL MANOMETRY N/A 08/04/2017   Procedure: ESOPHAGEAL MANOMETRY (EM);  Surgeon: Toney Reil, MD;  Location: [facility] ENDOSCOPY;  Service: Endoscopy;  Laterality: N/A;  . FRACTURE SURGERY    . KNEE SURGERY    . KNEE SURGERY     right knee   . VASECTOMY      Current Outpatient Medications  Medication Sig Dispense Refill  . amLODipine (NORVASC) 5 MG tablet Take 1 tablet (5 mg total) by mouth daily. 90 tablet 1  . aspirin 81 MG tablet Take 81 mg by mouth daily.    . carvedilol (COREG) 25 MG tablet Take 1 tablet (25 mg total) by mouth 2 (two) times daily with a meal. 90 tablet 1  . Icosapent Ethyl (VASCEPA) 1 g CAPS Take 2 capsules (2 g  total) by mouth 2 (two) times daily. 120 capsule 3  . losartan (COZAAR) 100 MG tablet Take 1 tablet (100 mg total) by mouth daily. 90 tablet 1  . pantoprazole (PROTONIX) 20 MG tablet Take 40 mg by mouth 2 (two) times daily.     . Sennosides (SENNA-EX PO) Take by mouth as needed.    . sildenafil (VIAGRA) 50 MG tablet Take 50 mg by mouth as needed for erectile dysfunction.    Marland Kitchen spironolactone (ALDACTONE) 25 MG tablet Take 25 mg by mouth daily.    Marland Kitchen torsemide (DEMADEX) 20 MG tablet Take 2 tablets (40 mg total) by mouth 2 (two) times daily. 120 tablet 6  . traMADol (ULTRAM) 50 MG tablet tramadol 50 mg tablet  Take 2 tablets every 6 hours by oral route.    Marland Kitchen atorvastatin (LIPITOR) 40 MG  tablet Take 1 tablet (40 mg total) by mouth daily. 90 tablet 3  . isosorbide mononitrate (IMDUR) 30 MG 24 hr tablet Take 1 tablet (30 mg total) by mouth daily. 30 tablet 6  . metolazone (ZAROXOLYN) 5 MG tablet Take 1 tablet (5 mg total) by mouth daily as needed. 30 tablet 3  . potassium chloride (K-DUR) 10 MEQ tablet Take 1 tablet (10 mEq total) by mouth 2 (two) times daily as needed. 180 tablet 3   No current facility-administered medications for this visit.      Allergies:   Lisinopril   Social History:  The patient  reports that he quit smoking about 12 years ago. His smoking use included cigarettes. He has a 0.50 pack-year smoking history. He has quit using smokeless tobacco.  His smokeless tobacco use included chew. He reports that he does not drink alcohol or use drugs.   Family History:   family history includes Arthritis in his mother; Cancer in his maternal grandfather; Cataracts in his maternal grandmother; Glaucoma in his maternal grandmother; Heart attack in his paternal grandfather; Heart attack (age of onset: 8) in his father; Heart murmur in his brother; Hyperlipidemia in his father; Hypertension in his father and paternal grandfather; Valvular heart disease in his brother.    Review of Systems: Review of Systems  Constitutional: Negative.        Weight gain  Respiratory: Positive for shortness of breath.   Cardiovascular: Positive for leg swelling.  Gastrointestinal: Negative.   Musculoskeletal: Negative.   Neurological: Negative.   Psychiatric/Behavioral: Negative.   All other systems reviewed and are negative.    PHYSICAL EXAM: VS:  BP (!) 158/84 (BP Location: Left Arm, Patient Position: Sitting, Cuff Size: Normal)   Pulse 60   Ht 5\' 7"  (1.702 m)   Wt 230 lb 8 oz (104.6 kg)   BMI 36.10 kg/m  , BMI Body mass index is 36.1 kg/m. GEN: Well nourished, well developed, in no acute distress  HEENT: normal  Neck: no JVD, carotid bruits, or masses Cardiac: RRR; no  murmurs, rubs, or gallops, 2+ pitting lower extremity edema to below the knees Respiratory:  clear to auscultation bilaterally, normal work of breathing GI: soft, nontender, nondistended, + BS MS: no deformity or atrophy  Skin: warm and dry, no rash Neuro:  Strength and sensation are intact Psych: euthymic mood, full affect   Recent Labs: 09/08/2017: B Natriuretic Peptide 8.0 02/02/2018: ALT 51; BUN 15; Creatinine, Ser 1.04; Hemoglobin 13.6; Platelets 233; Potassium 3.4; Sodium 143; TSH 1.600    Lipid Panel Lab Results  Component Value Date   CHOL 185 02/02/2018   HDL 35 (  L) 02/02/2018   LDLCALC Comment 02/02/2018   TRIG 692 (HH) 02/02/2018      Wt Readings from Last 3 Encounters:  04/13/18 230 lb 8 oz (104.6 kg)  02/02/18 224 lb (101.6 kg)  08/31/17 231 lb 8 oz (105 kg)      ASSESSMENT AND PLAN:  Acute on chronic diastolic congestive heart failure (HCC) -  Dramatic weight gain 12 pounds over the past 2 weeks Etiology unclear, seem to be responding well to torsemide Recommend he add metolazone 5 mg tomorrow 30 minutes before he takes his torsemide 40 in the morning Continue torsemide 40 twice daily For weight greater than 230 would take metolazone 5 For weight greater than 227 would take metolazone 2.5 Recommended he proceed cautiously to avoid dehydration Last potassium was low we will start potassium 10 mEq daily On days with metolazone he will take 30 mEq potassium  Coronary artery disease involving native coronary artery of native heart with angina pectoris (HCC) He is worried about underlying ischemia as a cause of his dizziness Dizziness is more concerning for vertigo though he does report similar symptoms prior to bypass and prior to previous stent placement Reports having work-up with ENT and was told he did not have vertigo Symptoms not consistent with orthostasis or arrhythmia  S/P coronary artery bypass graft x 2 - Plan: EKG 12-Lead Plan as above, no plan  for ischemic work-up at this time Follow-up with Dr. Kirke Corin for further discussion of ischemic work-up  Hyperlipidemia, unspecified hyperlipidemia type Cholesterol above goal, would benefit from Zetia Not added today another medication changes  SOB (shortness of breath) Secondary to acute on chronic diastolic CHF Exacerbated by poorly controlled hypertension Plan as above  Essential hypertension Recommend that he add Imdur 30 mg daily This can be titrated upwards as tolerated  Chronic dizziness Concerning for vertigo but was told by ENT this was not the case Reports having extensive ENT work-up in the past and by his report was negative He has not tried meclizine He is concerned vertigo-like symptoms are sign of underlying ischemia  Disposition:   F/U with Dr. Kirke Corin in the next several weeks   Total encounter time more than 60 minutes  Greater than 50% was spent in counseling and coordination of care with the patient    Orders Placed This Encounter  Procedures  . Basic metabolic panel  . EKG 12-Lead     Signed, Dossie Arbour, M.D., Ph.D. 04/13/2018  [facility] [place], [place] [phone]

## 2018-04-20 ENCOUNTER — Other Ambulatory Visit (INDEPENDENT_AMBULATORY_CARE_PROVIDER_SITE_OTHER)

## 2018-04-20 DIAGNOSIS — I5033 Acute on chronic diastolic (congestive) heart failure: Secondary | ICD-10-CM

## 2018-04-21 ENCOUNTER — Other Ambulatory Visit: Payer: Self-pay | Admitting: Physician Assistant

## 2018-04-21 DIAGNOSIS — K219 Gastro-esophageal reflux disease without esophagitis: Secondary | ICD-10-CM

## 2018-04-21 LAB — BASIC METABOLIC PANEL
BUN / CREAT RATIO: 34 — AB (ref 10–24)
BUN: 44 mg/dL — ABNORMAL HIGH (ref 8–27)
CHLORIDE: 87 mmol/L — AB (ref 96–106)
CO2: 26 mmol/L (ref 20–29)
Calcium: 9.3 mg/dL (ref 8.6–10.2)
Creatinine, Ser: 1.31 mg/dL — ABNORMAL HIGH (ref 0.76–1.27)
GFR calc Af Amer: 67 mL/min/{1.73_m2} (ref >59)
GFR calc non Af Amer: 58 mL/min/{1.73_m2} — ABNORMAL LOW (ref >59)
GLUCOSE: 196 mg/dL — AB (ref 65–99)
POTASSIUM: 3.4 mmol/L — AB (ref 3.5–5.2)
Sodium: 139 mmol/L (ref 134–144)

## 2018-04-21 NOTE — Telephone Encounter (Signed)
Patient would like a refill on the following medication. Patient uses [facility]. Thanks CC   pantoprazole (PROTONIX) 20 MG tablet

## 2018-04-22 MED ORDER — PANTOPRAZOLE SODIUM 20 MG PO TBEC: 40.0000 mg | DELAYED_RELEASE_TABLET | Freq: Two times a day (BID) | ORAL | 1 refills | Status: DC

## 2018-04-22 NOTE — Addendum Note (Signed)
Addended by: Jeanene Erb on: 04/22/2018 08:47 AM   Modules accepted: Orders

## 2018-04-22 NOTE — Telephone Encounter (Signed)
Patient was advised and would like for the medication to be send to the [facility] pharmacy

## 2018-04-23 NOTE — Progress Notes (Addendum)
Cardiology Office Note Date:  04/25/2018  Patient ID:  Jonathon Newman, DOB 1955-11-05, MRN [id number] PCP:  Margaretann Loveless, PA-C  Cardiologist:  Dr. Kirke Corin, MD    Chief Complaint: Follow up  History of Present Illness: Jonathon Newman is a 62 y.o. male with history of CAD s/p 2-vessel CABG in 2016 at the [facility], HFpEF, HTN, HLD, sleep apnea on CPAP, and prior tobacco abuse who presents for follow-up of acute on chronic diastolic CHF.  Patient is retired from [employer] though is no longer eligible for his care through the [employer]. He previously underwent Cypher drug-eluting stent to the RCA in 2010. This was followed by a NSTEMI in 2016 with cardiac cath showing a patent RCA stent with significant distal left main disease with an FFR of 0.76 and ostial LCx stenosis. He underwent 2-vessel CABG with a LIMA to LAD and SVG to OM2 in 2016. He was seen in 06/2017 for atypical chest pain and exertional dyspnea. He underwent Lexiscan Myoview on 07/08/2017 that showed no evidence of ischemia with a normal EF. When he was evaluated in 08/2017, he noted some improvement in symptoms but continued to have mild dyspnea and leg edema that was worse at the end of the day. He was taking torsemide 40 mg in the morning and 20 mg in the evening. Weight at that visit was 231 pounds which was up 3 pounds from 06/2017 visit. Labs checked in 08/2017 showed a BNP of 8, SCr 1.04, K+ 3.6, AST 50, ALT 77, TG 386, LDL 54. His Lipitor was decreased to 40 mg and Vascepa was added.   Patient was seen as an add-on on 10/30 by Dr. Mariah Milling noting a 12-pound weight gain. Weight of 230 pounds at that visit. Patient reported a dry weight of ~ 220 pounds. Recent weight in 01/2018 at PCP office of 224 pounds. He reported compliance with medications and denied excess fluids or salt intake. He continued to take torsemide 40 mg in the morning and 20 mg in the afternoon with an occasional extra dose before bed. He  reported compliance with his CPAP. He noted dyspnea with ambulation of 50-70 feet. BP was running high in the 160-180 systolic range. He also noted dizziness in which he was concerned may be ischemia. Prior ENT workup was negative for vertigo. He was started on metolazone 5 mg for weight > 230 pounds and 2.5 mg for weight > 227 pounds, with recommendation to take torsemide 40 mg bid. He was started on KCl 10 mEq daily with recommendation to take KCl 30 mEq on days he takes metolazone. He was also started on Imdur.  Follow-up BMP on 04/20/2018 showed slight bump in serum creatinine to 1.31 with a baseline approximately 1.0, and mild hypokalemia with a potassium of 3.4.  On 11/10 patient was advised to hold off on metolazone and to take an extra 2 doses of KCl.  He comes in today accompanied by his wife.  He has noted significant improvement in the lower extremity swelling, though no significant improvement in his longstanding abdominal distension.  He continues to note significant dizziness as well as exertional chest pressure.  Both symptoms are exactly similar to his presentation leading up to his PCI in 2010 and CABG in 2016. Currently, chest pain free. He has continued to take metolazone 5 mg on an every other day basis, rather than as directed based on weight trends.  He has not been cutting the tab  in half as directed. He now states he does not feel like his dry weight is ~ 220 pounds, though does not provide a specific reason or weight.  He denies any orthopnea, sleeping without any pillows. He notes if he sleeps on his back, he becomes dyspneic.  He notes some early satiety.  He reports it has been at least 12 years since he has had his CPAP. He denies any dietary indiscretions.  He would like to have a cath. He is scheduled for a lithotripsy on 11/25. Lastly, he asks to change from Dr. Kirke Corin to Dr. Mariah Milling.     Past Medical History:  Diagnosis Date  . Arthritis   . CHF (congestive heart failure) (HCC)     . Coronary artery disease    PCI and Cypher drug-eluting stent placement to the right coronary artery in 2010.  NSTEMI in 2016.  Cardiac catheterization showed patent RCA stent, significant distal left main disease with an FFR ratio of 0.76 and ostial left circumflex stenosis.  Underwent CABG with LIMA to LAD and SVG to OM 2.  . GERD (gastroesophageal reflux disease)   . Hypercholesteremia   . Hypertension   . S/P coronary artery bypass graft x 2   . Sleep apnea     Past Surgical History:  Procedure Laterality Date  . APPENDECTOMY    . BACK SURGERY  11/01/2017   SL 5 and S1  . CARDIAC CATHETERIZATION    . CARPAL TUNNEL RELEASE     left hand  . CHOLECYSTECTOMY    . CHOLECYSTECTOMY    . CORONARY ARTERY BYPASS GRAFT  04/21/2015   CABG x 2 [place] V[place]. LIMA to LAD amd SVG to OM2  . CORONARY STENT PLACEMENT  2010   Cordis Cypher Sirolimus-eluting stent 2.50 mm x 26 mm placed to the RCA at [facility]   . ESOPHAGEAL MANOMETRY N/A 08/04/2017   Procedure: ESOPHAGEAL MANOMETRY (EM);  Surgeon: Toney Reil, MD;  Location: [facility] ENDOSCOPY;  Service: Endoscopy;  Laterality: N/A;  . FRACTURE SURGERY    . KNEE SURGERY    . KNEE SURGERY     right knee   . VASECTOMY      Current Meds  Medication Sig  . amLODipine (NORVASC) 5 MG tablet Take 1 tablet (5 mg total) by mouth daily.  Marland Kitchen aspirin 81 MG tablet Take 81 mg by mouth daily.  Marland Kitchen atorvastatin (LIPITOR) 40 MG tablet Take 1 tablet (40 mg total) by mouth daily.  . carvedilol (COREG) 25 MG tablet Take 1 tablet (25 mg total) by mouth 2 (two) times daily with a meal.  . Icosapent Ethyl (VASCEPA) 1 g CAPS Take 2 capsules (2 g total) by mouth 2 (two) times daily.  . isosorbide mononitrate (IMDUR) 30 MG 24 hr tablet Take 1 tablet (30 mg total) by mouth daily.  Marland Kitchen losartan (COZAAR) 100 MG tablet Take 1 tablet (100 mg total) by mouth daily.  . metolazone (ZAROXOLYN) 5 MG tablet Take 1 tablet (5 mg total) by mouth daily as  needed.  . pantoprazole (PROTONIX) 20 MG tablet Take 2 tablets (40 mg total) by mouth 2 (two) times daily.  . potassium chloride (K-DUR) 10 MEQ tablet Take 1 tablet (10 mEq total) by mouth 2 (two) times daily as needed.  . Sennosides (SENNA-EX PO) Take by mouth as needed.  . sildenafil (VIAGRA) 50 MG tablet Take 50 mg by mouth as needed for erectile dysfunction.  Marland Kitchen spironolactone (ALDACTONE) 25 MG tablet Take  25 mg by mouth daily.  Marland Kitchen torsemide (DEMADEX) 20 MG tablet Take 2 tablets (40 mg total) by mouth 2 (two) times daily.  . traMADol (ULTRAM) 50 MG tablet tramadol 50 mg tablet  Take 2 tablets every 6 hours by oral route.    Allergies:   Lisinopril   Social History:  The patient  reports that he quit smoking about 12 years ago. His smoking use included cigarettes. He has a 0.50 pack-year smoking history. He has quit using smokeless tobacco.  His smokeless tobacco use included chew. He reports that he does not drink alcohol or use drugs.   Family History:  [name] family history includes Arthritis in his mother; Cancer in his maternal grandfather; Cataracts in his maternal grandmother; Glaucoma in his maternal grandmother; Heart attack in his paternal grandfather; Heart attack (age of onset: 67) in his father; Heart murmur in his brother; Hyperlipidemia in his father; Hypertension in his father and paternal grandfather; Valvular heart disease in his brother.  ROS:   Review of Systems  Constitutional: Positive for malaise/fatigue. Negative for chills, diaphoresis, fever and weight loss.  HENT: Negative for congestion.   Eyes: Negative for discharge and redness.  Respiratory: Positive for shortness of breath. Negative for cough, hemoptysis, sputum production and wheezing.   Cardiovascular: Positive for chest pain and leg swelling. Negative for palpitations, orthopnea, claudication and PND.  Gastrointestinal: Negative for abdominal pain, blood in stool, heartburn, melena, nausea and  vomiting.  Genitourinary: Negative for hematuria.  Musculoskeletal: Negative for falls and myalgias.  Skin: Negative for rash.  Neurological: Positive for dizziness and weakness. Negative for tingling, tremors, sensory change, speech change, focal weakness and loss of consciousness.  Endo/Heme/Allergies: Does not bruise/bleed easily.  Psychiatric/Behavioral: Negative for substance abuse. The patient is not nervous/anxious.   All other systems reviewed and are negative.    PHYSICAL EXAM:  VS:  BP 120/64 (BP Location: Left Arm, Patient Position: Sitting, Cuff Size: Normal)   Ht 5\' 7"  (1.702 m)   Wt 221 lb (100.2 kg)   BMI 34.61 kg/m  BMI: Body mass index is 34.61 kg/m.  Physical Exam  Constitutional: He is oriented to person, place, and time. He appears well-developed and well-nourished.  HENT:  Head: Normocephalic and atraumatic.  Eyes: Right eye exhibits no discharge. Left eye exhibits no discharge.  Neck: Normal range of motion. No JVD present.  Cardiovascular: Normal rate, regular rhythm, S1 normal, S2 normal and normal heart sounds. Exam reveals no distant heart sounds, no friction rub, no midsystolic click and no opening snap.  No murmur heard. Pulses:      Posterior tibial pulses are 2+ on the right side, and 2+ on the left side.  Pulmonary/Chest: Effort normal and breath sounds normal. No respiratory distress. He has no decreased breath sounds. He has no wheezes. He has no rales. He exhibits no tenderness.  Abdominal: Soft. He exhibits no distension. There is no tenderness.  Musculoskeletal: He exhibits no edema.  Neurological: He is alert and oriented to person, place, and time.  Skin: Skin is warm and dry. No cyanosis. Nails show no clubbing.  Psychiatric: He has a normal mood and affect. His speech is normal and behavior is normal. Judgment and thought content normal.     EKG:  Was ordered and interpreted by me today. Shows NSR, 71 bpm, inferior Q waves, no acute st/t  changes (unchanged from prior)  Recent Labs: 09/08/2017: B Natriuretic Peptide 8.0 02/02/2018: ALT 51; Hemoglobin 13.6; Platelets 233; TSH  1.600 04/20/2018: BUN 44; Creatinine, Ser 1.31; Potassium 3.4; Sodium 139  09/08/2017: VLDL 77 02/02/2018: Chol/HDL Ratio 5.3; Cholesterol, Total 185; HDL 35; LDL Calculated Comment; Triglycerides 692   Estimated Creatinine Clearance: 66.8 mL/min (A) (by C-G formula based on SCr of 1.31 mg/dL (H)).   Wt Readings from Last 3 Encounters:  04/25/18 221 lb (100.2 kg)  04/13/18 230 lb 8 oz (104.6 kg)  02/02/18 224 lb (101.6 kg)     Other studies reviewed: Additional studies/records reviewed today include: summarized above  ASSESSMENT AND PLAN:  1. CAD s/p CABG with unstable angina: Currently chest pain free. Patient has noted intermittent exertional chest pain with associated dizziness. These are symptoms that he has experienced leading up to his PCI and CABG as above.  Recent low risk Myoview in 06/2017.  Symptoms did not improve with diuresis.  Patient requests cardiac cath, which has been scheduled for him, pending renal function trend.  Schedule echo following LHC.  Risks and benefits of cardiac catheterization have been discussed with the patient including risks of bleeding, bruising, infection, kidney damage, stroke, heart attack, and death. The patient understands these risks and is willing to proceed with the procedure. All questions have been answered and concerns listened to. Continue ASA, Liptior, Imdur, and Coreg.   2. Chronic diastolic CHF: Volume status is much improved. Hold metolazone moving forward. Continue torsemide 40 mg bid with KCl repletion. Check BMP. Continue spironolactone. CHF education.   3. Dizziness: Patient indicates dizziness has been associated with his prior angina leading to PCI and CABG as above.  Patient asks for LHC at this time given his symptoms. Scheduled for LHC as above, pending renal function.   4. AKI: Advised patient  to hold any further metolazone (last dose was 5 mg this morning, though home weight was 217 pounds). Advised patient we may need to hold spironolactone, torsemide, KCl, and losartan as well. Check bmet today.   5. HTN: Blood pressure is well controlled today. Remains on amlodipine, Coreg, Imdur, losartan, spironolactone, and torsemide.   6. HLD/hypertriglyceridemia: Goal LDL < 70. LDL 54 from 08/2017 with a TG of 692 from 01/2018. Remains on Lipitor 40 mg daily and Vascepa.   7. Preoperative cardiac evaluation: Risk stratification deferred until ischemic cardiac evaluation is complete as above.  8. OSA: It has been at least 12 years since he has undergone a sleep study. With the above symptoms, including dyspnea, we have referred him to Pulmonology for repeat sleep study, if indicated.   Disposition: F/u with Dr. Mariah Milling or an APP in 1 month. Patient wants to change from Dr. Kirke Corin to Dr. Mariah Milling. Message has been sent to both MDs for approval.   Current medicines are reviewed at length with the patient today.  The patient did not have any concerns regarding medicines.  Signed, Eula Listen, PA-C 04/25/2018 11:28 AM     [facility] [address] [place], [place] [address] [phone]

## 2018-04-23 NOTE — H&P (View-Only) (Signed)
Cardiology Office Note Date:  04/25/2018  Patient ID:  Jonathon Newman, DOB Dec 02, 1955, MRN [id number] PCP:  Margaretann Loveless, PA-C  Cardiologist:  Dr. Kirke Corin, MD    Chief Complaint: Follow up  History of Present Illness: Jonathon Newman is a 62 y.o. male with history of CAD s/p 2-vessel CABG in 2016 at the [facility], HFpEF, HTN, HLD, sleep apnea on CPAP, and prior tobacco abuse who presents for follow-up of acute on chronic diastolic CHF.  Patient is retired from [employer] though is no longer eligible for his care through the [employer]. He previously underwent Cypher drug-eluting stent to the RCA in 2010. This was followed by a NSTEMI in 2016 with cardiac cath showing a patent RCA stent with significant distal left main disease with an FFR of 0.76 and ostial LCx stenosis. He underwent 2-vessel CABG with a LIMA to LAD and SVG to OM2 in 2016. He was seen in 06/2017 for atypical chest pain and exertional dyspnea. He underwent Lexiscan Myoview on 07/08/2017 that showed no evidence of ischemia with a normal EF. When he was evaluated in 08/2017, he noted some improvement in symptoms but continued to have mild dyspnea and leg edema that was worse at the end of the day. He was taking torsemide 40 mg in the morning and 20 mg in the evening. Weight at that visit was 231 pounds which was up 3 pounds from 06/2017 visit. Labs checked in 08/2017 showed a BNP of 8, SCr 1.04, K+ 3.6, AST 50, ALT 77, TG 386, LDL 54. His Lipitor was decreased to 40 mg and Vascepa was added.   Patient was seen as an add-on on 10/30 by Dr. Mariah Milling noting a 12-pound weight gain. Weight of 230 pounds at that visit. Patient reported a dry weight of ~ 220 pounds. Recent weight in 01/2018 at PCP office of 224 pounds. He reported compliance with medications and denied excess fluids or salt intake. He continued to take torsemide 40 mg in the morning and 20 mg in the afternoon with an occasional extra dose before bed. He  reported compliance with his CPAP. He noted dyspnea with ambulation of 50-70 feet. BP was running high in the 160-180 systolic range. He also noted dizziness in which he was concerned may be ischemia. Prior ENT workup was negative for vertigo. He was started on metolazone 5 mg for weight > 230 pounds and 2.5 mg for weight > 227 pounds, with recommendation to take torsemide 40 mg bid. He was started on KCl 10 mEq daily with recommendation to take KCl 30 mEq on days he takes metolazone. He was also started on Imdur.  Follow-up BMP on 04/20/2018 showed slight bump in serum creatinine to 1.31 with a baseline approximately 1.0, and mild hypokalemia with a potassium of 3.4.  On 11/10 patient was advised to hold off on metolazone and to take an extra 2 doses of KCl.  He comes in today accompanied by his wife.  He has noted significant improvement in the lower extremity swelling, though no significant improvement in his longstanding abdominal distension.  He continues to note significant dizziness as well as exertional chest pressure.  Both symptoms are exactly similar to his presentation leading up to his PCI in 2010 and CABG in 2016. Currently, chest pain free. He has continued to take metolazone 5 mg on an every other day basis, rather than as directed based on weight trends.  He has not been cutting the tab  in half as directed. He now states he does not feel like his dry weight is ~ 220 pounds, though does not provide a specific reason or weight.  He denies any orthopnea, sleeping without any pillows. He notes if he sleeps on his back, he becomes dyspneic.  He notes some early satiety.  He reports it has been at least 12 years since he has had his CPAP. He denies any dietary indiscretions.  He would like to have a cath. He is scheduled for a lithotripsy on 11/25. Lastly, he asks to change from Dr. Kirke Corin to Dr. Mariah Milling.     Past Medical History:  Diagnosis Date  . Arthritis   . CHF (congestive heart failure) (HCC)     . Coronary artery disease    PCI and Cypher drug-eluting stent placement to the right coronary artery in 2010.  NSTEMI in 2016.  Cardiac catheterization showed patent RCA stent, significant distal left main disease with an FFR ratio of 0.76 and ostial left circumflex stenosis.  Underwent CABG with LIMA to LAD and SVG to OM 2.  . GERD (gastroesophageal reflux disease)   . Hypercholesteremia   . Hypertension   . S/P coronary artery bypass graft x 2   . Sleep apnea     Past Surgical History:  Procedure Laterality Date  . APPENDECTOMY    . BACK SURGERY  11/01/2017   SL 5 and S1  . CARDIAC CATHETERIZATION    . CARPAL TUNNEL RELEASE     left hand  . CHOLECYSTECTOMY    . CHOLECYSTECTOMY    . CORONARY ARTERY BYPASS GRAFT  04/21/2015   CABG x 2 [place] LIMA to LAD amd SVG to OM2  . CORONARY STENT PLACEMENT  2010   Cordis Cypher Sirolimus-eluting stent 2.50 mm x 26 mm placed to the RCA at [facility]   . ESOPHAGEAL MANOMETRY N/A 08/04/2017   Procedure: ESOPHAGEAL MANOMETRY (EM);  Surgeon: Toney Reil, MD;  Location: [facility] ENDOSCOPY;  Service: Endoscopy;  Laterality: N/A;  . FRACTURE SURGERY    . KNEE SURGERY    . KNEE SURGERY     right knee   . VASECTOMY      Current Meds  Medication Sig  . amLODipine (NORVASC) 5 MG tablet Take 1 tablet (5 mg total) by mouth daily.  Marland Kitchen aspirin 81 MG tablet Take 81 mg by mouth daily.  Marland Kitchen atorvastatin (LIPITOR) 40 MG tablet Take 1 tablet (40 mg total) by mouth daily.  . carvedilol (COREG) 25 MG tablet Take 1 tablet (25 mg total) by mouth 2 (two) times daily with a meal.  . Icosapent Ethyl (VASCEPA) 1 g CAPS Take 2 capsules (2 g total) by mouth 2 (two) times daily.  . isosorbide mononitrate (IMDUR) 30 MG 24 hr tablet Take 1 tablet (30 mg total) by mouth daily.  Marland Kitchen losartan (COZAAR) 100 MG tablet Take 1 tablet (100 mg total) by mouth daily.  . metolazone (ZAROXOLYN) 5 MG tablet Take 1 tablet (5 mg total) by mouth daily as  needed.  . pantoprazole (PROTONIX) 20 MG tablet Take 2 tablets (40 mg total) by mouth 2 (two) times daily.  . potassium chloride (K-DUR) 10 MEQ tablet Take 1 tablet (10 mEq total) by mouth 2 (two) times daily as needed.  . Sennosides (SENNA-EX PO) Take by mouth as needed.  . sildenafil (VIAGRA) 50 MG tablet Take 50 mg by mouth as needed for erectile dysfunction.  Marland Kitchen spironolactone (ALDACTONE) 25 MG tablet Take  25 mg by mouth daily.  Marland Kitchen torsemide (DEMADEX) 20 MG tablet Take 2 tablets (40 mg total) by mouth 2 (two) times daily.  . traMADol (ULTRAM) 50 MG tablet tramadol 50 mg tablet  Take 2 tablets every 6 hours by oral route.    Allergies:   Lisinopril   Social History:  The patient  reports that he quit smoking about 12 years ago. His smoking use included cigarettes. He has a 0.50 pack-year smoking history. He has quit using smokeless tobacco.  His smokeless tobacco use included chew. He reports that he does not drink alcohol or use drugs.   Family History:  [name] family history includes Arthritis in his mother; Cancer in his maternal grandfather; Cataracts in his maternal grandmother; Glaucoma in his maternal grandmother; Heart attack in his paternal grandfather; Heart attack (age of onset: 13) in his father; Heart murmur in his brother; Hyperlipidemia in his father; Hypertension in his father and paternal grandfather; Valvular heart disease in his brother.  ROS:   Review of Systems  Constitutional: Positive for malaise/fatigue. Negative for chills, diaphoresis, fever and weight loss.  HENT: Negative for congestion.   Eyes: Negative for discharge and redness.  Respiratory: Positive for shortness of breath. Negative for cough, hemoptysis, sputum production and wheezing.   Cardiovascular: Positive for chest pain and leg swelling. Negative for palpitations, orthopnea, claudication and PND.  Gastrointestinal: Negative for abdominal pain, blood in stool, heartburn, melena, nausea and  vomiting.  Genitourinary: Negative for hematuria.  Musculoskeletal: Negative for falls and myalgias.  Skin: Negative for rash.  Neurological: Positive for dizziness and weakness. Negative for tingling, tremors, sensory change, speech change, focal weakness and loss of consciousness.  Endo/Heme/Allergies: Does not bruise/bleed easily.  Psychiatric/Behavioral: Negative for substance abuse. The patient is not nervous/anxious.   All other systems reviewed and are negative.    PHYSICAL EXAM:  VS:  BP 120/64 (BP Location: Left Arm, Patient Position: Sitting, Cuff Size: Normal)   Ht 5\' 7"  (1.702 m)   Wt 221 lb (100.2 kg)   BMI 34.61 kg/m  BMI: Body mass index is 34.61 kg/m.  Physical Exam  Constitutional: He is oriented to person, place, and time. He appears well-developed and well-nourished.  HENT:  Head: Normocephalic and atraumatic.  Eyes: Right eye exhibits no discharge. Left eye exhibits no discharge.  Neck: Normal range of motion. No JVD present.  Cardiovascular: Normal rate, regular rhythm, S1 normal, S2 normal and normal heart sounds. Exam reveals no distant heart sounds, no friction rub, no midsystolic click and no opening snap.  No murmur heard. Pulses:      Posterior tibial pulses are 2+ on the right side, and 2+ on the left side.  Pulmonary/Chest: Effort normal and breath sounds normal. No respiratory distress. He has no decreased breath sounds. He has no wheezes. He has no rales. He exhibits no tenderness.  Abdominal: Soft. He exhibits no distension. There is no tenderness.  Musculoskeletal: He exhibits no edema.  Neurological: He is alert and oriented to person, place, and time.  Skin: Skin is warm and dry. No cyanosis. Nails show no clubbing.  Psychiatric: He has a normal mood and affect. His speech is normal and behavior is normal. Judgment and thought content normal.     EKG:  Was ordered and interpreted by me today. Shows NSR, 71 bpm, inferior Q waves, no acute st/t  changes (unchanged from prior)  Recent Labs: 09/08/2017: B Natriuretic Peptide 8.0 02/02/2018: ALT 51; Hemoglobin 13.6; Platelets 233; TSH  1.600 04/20/2018: BUN 44; Creatinine, Ser 1.31; Potassium 3.4; Sodium 139  09/08/2017: VLDL 77 02/02/2018: Chol/HDL Ratio 5.3; Cholesterol, Total 185; HDL 35; LDL Calculated Comment; Triglycerides 692   Estimated Creatinine Clearance: 66.8 mL/min (A) (by C-G formula based on SCr of 1.31 mg/dL (H)).   Wt Readings from Last 3 Encounters:  04/25/18 221 lb (100.2 kg)  04/13/18 230 lb 8 oz (104.6 kg)  02/02/18 224 lb (101.6 kg)     Other studies reviewed: Additional studies/records reviewed today include: summarized above  ASSESSMENT AND PLAN:  1. CAD s/p CABG with unstable angina: Currently chest pain free. Patient has noted intermittent exertional chest pain with associated dizziness. These are symptoms that he has experienced leading up to his PCI and CABG as above.  Recent low risk Myoview in 06/2017.  Symptoms did not improve with diuresis.  Patient requests cardiac cath, which has been scheduled for him, pending renal function trend.  Schedule echo following LHC.  Risks and benefits of cardiac catheterization have been discussed with the patient including risks of bleeding, bruising, infection, kidney damage, stroke, heart attack, and death. The patient understands these risks and is willing to proceed with the procedure. All questions have been answered and concerns listened to. Continue ASA, Liptior, Imdur, and Coreg.   2. Chronic diastolic CHF: Volume status is much improved. Hold metolazone moving forward. Continue torsemide 40 mg bid with KCl repletion. Check BMP. Continue spironolactone. CHF education.   3. Dizziness: Patient indicates dizziness has been associated with his prior angina leading to PCI and CABG as above.  Patient asks for LHC at this time given his symptoms. Scheduled for LHC as above, pending renal function.   4. AKI: Advised patient  to hold any further metolazone (last dose was 5 mg this morning, though home weight was 217 pounds). Advised patient we may need to hold spironolactone, torsemide, KCl, and losartan as well. Check bmet today.   5. HTN: Blood pressure is well controlled today. Remains on amlodipine, Coreg, Imdur, losartan, spironolactone, and torsemide.   6. HLD/hypertriglyceridemia: Goal LDL < 70. LDL 54 from 08/2017 with a TG of 692 from 01/2018. Remains on Lipitor 40 mg daily and Vascepa.   7. Preoperative cardiac evaluation: Risk stratification deferred until ischemic cardiac evaluation is complete as above.  8. OSA: It has been at least 12 years since he has undergone a sleep study. With the above symptoms, including dyspnea, we have referred him to Pulmonology for repeat sleep study, if indicated.   Disposition: F/u with Dr. Mariah Milling or an APP in 1 month. Patient wants to change from Dr. Kirke Corin to Dr. Mariah Milling. Message has been sent to both MDs for approval.   Current medicines are reviewed at length with the patient today.  The patient did not have any concerns regarding medicines.  Signed, Eula Listen, PA-C 04/25/2018 11:28 AM     [facility] [place] [address] [place], [place] [address] [phone]

## 2018-04-25 ENCOUNTER — Encounter: Payer: Self-pay | Admitting: Physician Assistant

## 2018-04-25 ENCOUNTER — Ambulatory Visit: Admitting: Physician Assistant

## 2018-04-25 ENCOUNTER — Ambulatory Visit (INDEPENDENT_AMBULATORY_CARE_PROVIDER_SITE_OTHER): Admitting: Physician Assistant

## 2018-04-25 VITALS — BP 120/64 | Ht 67.0 in | Wt 221.0 lb

## 2018-04-25 DIAGNOSIS — I200 Unstable angina: Secondary | ICD-10-CM

## 2018-04-25 DIAGNOSIS — Z01818 Encounter for other preprocedural examination: Secondary | ICD-10-CM

## 2018-04-25 DIAGNOSIS — E785 Hyperlipidemia, unspecified: Secondary | ICD-10-CM

## 2018-04-25 DIAGNOSIS — R42 Dizziness and giddiness: Secondary | ICD-10-CM | POA: Diagnosis not present

## 2018-04-25 DIAGNOSIS — I10 Essential (primary) hypertension: Secondary | ICD-10-CM

## 2018-04-25 DIAGNOSIS — I2510 Atherosclerotic heart disease of native coronary artery without angina pectoris: Secondary | ICD-10-CM

## 2018-04-25 DIAGNOSIS — I5032 Chronic diastolic (congestive) heart failure: Secondary | ICD-10-CM

## 2018-04-25 DIAGNOSIS — G4730 Sleep apnea, unspecified: Secondary | ICD-10-CM

## 2018-04-25 HISTORY — DX: Unstable angina: I20.0

## 2018-04-25 NOTE — Patient Instructions (Addendum)
Medication Instructions:  HOLD the Metolazone  If you need a refill on your cardiac medications before your next appointment, please call your pharmacy.   Lab work: Your provider would like for you to have the following labs today: BMET and CBC  If you have labs (blood work) drawn today and your tests are completely normal, you will receive your results only by: Marland Kitchen MyChart Message (if you have MyChart) OR . A paper copy in the mail If you have any lab test that is abnormal or we need to change your treatment, we will call you to review the results.  Testing/Procedures: Cardiac cath, please see instructions below  Follow-Up: At [facility], you and your health needs are our priority.  As part of our continuing mission to provide you with exceptional heart care, we have created designated Provider Care Teams.  These Care Teams include your primary Cardiologist (physician) and Advanced Practice Providers (APPs -  Physician Assistants and Nurse Practitioners) who all work together to provide you with the care you need, when you need it. You will need a follow up appointment after the cardiac cath.You may see Dr. Mariah Milling or one of the following Advanced Practice Providers on your designated Care Team:   Nicolasa Ducking, NP Eula Listen, PA-C . Marisue Ivan, PA-C  Any Other Special Instructions Will Be Listed Below (If Applicable). A referral has been placed for Pulmonary.      [facility] CARDIOVASCULAR DIVISION [facility] [address] [place] [address] Dept: [phone] Loc: [phone]   You are scheduled for a Cardiac Catheterization on Monday, November 18 with Dr. Lorine Bears.  1. Arrive at the Medical Mall entrance at 8:30 am, one hour prior to your procedure. Free valet service is available.  After entering the Medical Mall please check-in at the registration desk (1st desk on your right) to receive your  armband. After receiving your armband someone will escort you to the cardiac cath/special procedures waiting area.  If you have any questions, please call our office at [phone], or you may call the cardiac cath lab at [facility] directly at [phone]   Special note: Every effort is made to have your procedure done on time. Please understand that emergencies sometimes delay scheduled procedures.   2. Diet: Do not eat solid foods after midnight.  The patient may have clear liquids until 5am upon the day of the procedure.  3. Labs: You will need to have blood drawn today (BMET and CBC)  4. Medication instructions in preparation for your procedure: Hold the Spirolactone the morning of the procedure Hold the Torsemide the morning of the procedure. Hold the potassium the morning of the procedure.   On the morning of your procedure, take your Aspirin and any morning medicines NOT listed above.  You may use sips of water.  5. Plan for one night stay--bring personal belongings. 6. Bring a current list of your medications and current insurance cards. 7. You MUST have a responsible person to drive you home. 8. Someone MUST be with you the first 24 hours after you arrive home or your discharge will be delayed. 9. Please wear clothes that are easy to get on and off and wear slip-on shoes.  Thank you for allowing [place] to care for you!   -- [facility]

## 2018-04-26 LAB — CBC
HEMOGLOBIN: 14.9 g/dL (ref 13.0–17.7)
Hematocrit: 43.3 % (ref 37.5–51.0)
MCH: 29.6 pg (ref 26.6–33.0)
MCHC: 34.4 g/dL (ref 31.5–35.7)
MCV: 86 fL (ref 79–97)
Platelets: 288 10*3/uL (ref 150–450)
RBC: 5.04 x10E6/uL (ref 4.14–5.80)
RDW: 13.4 % (ref 12.3–15.4)
WBC: 8.7 10*3/uL (ref 3.4–10.8)

## 2018-04-26 LAB — BASIC METABOLIC PANEL
BUN/Creatinine Ratio: 25 — ABNORMAL HIGH (ref 10–24)
BUN: 39 mg/dL — AB (ref 8–27)
CHLORIDE: 91 mmol/L — AB (ref 96–106)
CO2: 24 mmol/L (ref 20–29)
CREATININE: 1.53 mg/dL — AB (ref 0.76–1.27)
Calcium: 10.1 mg/dL (ref 8.6–10.2)
GFR calc Af Amer: 56 mL/min/{1.73_m2} — ABNORMAL LOW (ref >59)
GFR calc non Af Amer: 48 mL/min/{1.73_m2} — ABNORMAL LOW (ref >59)
GLUCOSE: 143 mg/dL — AB (ref 65–99)
Potassium: 3.4 mmol/L — ABNORMAL LOW (ref 3.5–5.2)
Sodium: 138 mmol/L (ref 134–144)

## 2018-04-27 ENCOUNTER — Telehealth: Payer: Self-pay | Admitting: *Deleted

## 2018-04-27 DIAGNOSIS — I5032 Chronic diastolic (congestive) heart failure: Secondary | ICD-10-CM

## 2018-04-27 DIAGNOSIS — Z01818 Encounter for other preprocedural examination: Secondary | ICD-10-CM

## 2018-04-27 NOTE — Telephone Encounter (Signed)
Patient made aware of results and verbalized understanding.  He will go to the [facility] on Friday for a repeat BMET.   The patient stated that he is currently not taking spironolactone but will hold the torsemide, losartan and metolazone.  He also stated that he was concerned with holding off on the cardiac cath and felt like it needed to take place regardless of his kidney function. He has been educated on the importance of watching his kidney function prior to the cath.

## 2018-04-27 NOTE — Telephone Encounter (Signed)
-----   Message from Sondra Bargesyan M Dunn, PA-C sent at 04/26/2018  8:16 AM EST ----- CBC unremarkable. Renal function continues to worsen following metolazone usage.  Potassium remains low at 3.4.  -Recommend he continue to hold metolazone -Given AKI, would hold torsemide, spironolactone and losartan for the next 2 days -Please have patient take KCl 20 mEq x 1 today in an effort to replete potassium -Recommend rechecking BMP on 11/15 in the medical mall so we can have results back same day to provide instructions for medications hitting and to the weekend -We will need to see improvement and his renal function prior to proceeding with cardiac catheterization as was discussed at his office visit

## 2018-04-28 NOTE — Telephone Encounter (Signed)
Agree with patient needing cath; however, we need to trend renal function, especially given recent metolazone usage. Await renal function that is to be drawn on 04/29/18. If improved, hopefully we can have the cath on 05/02/18 as planned.

## 2018-04-29 ENCOUNTER — Telehealth: Payer: Self-pay | Admitting: Cardiovascular Disease

## 2018-04-29 ENCOUNTER — Other Ambulatory Visit
Admission: RE | Admit: 2018-04-29 | Discharge: 2018-04-29 | Disposition: A | Discharge status: Home / Self Care | Source: Ambulatory Visit | Attending: Physician Assistant | Admitting: Physician Assistant

## 2018-04-29 ENCOUNTER — Other Ambulatory Visit: Payer: Self-pay | Admitting: Physician Assistant

## 2018-04-29 DIAGNOSIS — Z01818 Encounter for other preprocedural examination: Secondary | ICD-10-CM | POA: Diagnosis present

## 2018-04-29 DIAGNOSIS — I200 Unstable angina: Secondary | ICD-10-CM

## 2018-04-29 DIAGNOSIS — I5032 Chronic diastolic (congestive) heart failure: Secondary | ICD-10-CM | POA: Insufficient documentation

## 2018-04-29 LAB — BASIC METABOLIC PANEL
Anion gap: 11 (ref 5–15)
BUN: 20 mg/dL (ref 8–23)
CALCIUM: 9.1 mg/dL (ref 8.9–10.3)
CHLORIDE: 104 mmol/L (ref 98–111)
CO2: 26 mmol/L (ref 22–32)
CREATININE: 1.05 mg/dL (ref 0.61–1.24)
GFR calc non Af Amer: >60 mL/min (ref >60)
GLUCOSE: 176 mg/dL — AB (ref 70–99)
Potassium: 3.5 mmol/L (ref 3.5–5.1)
Sodium: 141 mmol/L (ref 135–145)

## 2018-04-29 NOTE — Telephone Encounter (Signed)
Patient calling stating he forgot to ask Dr Mariah MillingGollan this morning  But he is needing to know if he is allowed to take an Inflammatories  He states he is going to an Orthopedic doctor for shots for his knee pains along with arm and wrist pains He states any kind of inflammatory would help   Please call back

## 2018-04-29 NOTE — Telephone Encounter (Signed)
Patient made aware of results and verbalized understanding.  Notes recorded by Sondra Bargesunn, [name], PA-C on 04/29/2018 at 9:28 AM EST Renal function has improved back to his baseline with a current value of 1.05. Potassium improving to 3.5. Random glucose is elevated at 176. Recommend he follow-up with PCP for hyperglycemia.  Renal function allows for cardiac catheterization to take place as scheduled on 05/02/2018.  The patient inquired about restarting the Torsemide. He stated that he has been having swelling in his feet and hands. Per Eula Listenyan Dunn, PA, he may restart the Torsemide 40 mg daily for now. The patient has verbalized his understanding.

## 2018-04-29 NOTE — Progress Notes (Signed)
Orders for cardiac cath placed. 

## 2018-04-29 NOTE — Telephone Encounter (Signed)
I called and spoke with the patient. He states he did not call in asking about this. I apologized for the call- advised this must have been taken in his chart inadvertently.  Will forward back to Hiraa to clarify.

## 2018-04-29 NOTE — Telephone Encounter (Signed)
Safety zone entered.   This was entered in wrong chart

## 2018-05-02 ENCOUNTER — Ambulatory Visit
Admission: RE | Admit: 2018-05-02 | Discharge: 2018-05-02 | Disposition: A | Discharge status: Home / Self Care | Source: Ambulatory Visit | Attending: Cardiovascular Disease | Admitting: Cardiovascular Disease

## 2018-05-02 ENCOUNTER — Encounter
Admission: RE | Disposition: A | Discharge status: Home / Self Care | Payer: Self-pay | Source: Ambulatory Visit | Attending: Cardiovascular Disease

## 2018-05-02 ENCOUNTER — Telehealth: Payer: Self-pay | Admitting: Cardiovascular Disease

## 2018-05-02 ENCOUNTER — Other Ambulatory Visit: Payer: Self-pay

## 2018-05-02 ENCOUNTER — Encounter: Payer: Self-pay | Admitting: *Deleted

## 2018-05-02 DIAGNOSIS — Z8249 Family history of ischemic heart disease and other diseases of the circulatory system: Secondary | ICD-10-CM | POA: Insufficient documentation

## 2018-05-02 DIAGNOSIS — E7800 Pure hypercholesterolemia, unspecified: Secondary | ICD-10-CM | POA: Insufficient documentation

## 2018-05-02 DIAGNOSIS — Z7982 Long term (current) use of aspirin: Secondary | ICD-10-CM | POA: Diagnosis not present

## 2018-05-02 DIAGNOSIS — G4733 Obstructive sleep apnea (adult) (pediatric): Secondary | ICD-10-CM | POA: Diagnosis not present

## 2018-05-02 DIAGNOSIS — Z888 Allergy status to other drugs, medicaments and biological substances status: Secondary | ICD-10-CM | POA: Insufficient documentation

## 2018-05-02 DIAGNOSIS — Z79899 Other long term (current) drug therapy: Secondary | ICD-10-CM | POA: Diagnosis not present

## 2018-05-02 DIAGNOSIS — M1990 Unspecified osteoarthritis, unspecified site: Secondary | ICD-10-CM | POA: Insufficient documentation

## 2018-05-02 DIAGNOSIS — Z955 Presence of coronary angioplasty implant and graft: Secondary | ICD-10-CM | POA: Diagnosis not present

## 2018-05-02 DIAGNOSIS — I200 Unstable angina: Secondary | ICD-10-CM | POA: Insufficient documentation

## 2018-05-02 DIAGNOSIS — I252 Old myocardial infarction: Secondary | ICD-10-CM | POA: Diagnosis not present

## 2018-05-02 DIAGNOSIS — R42 Dizziness and giddiness: Secondary | ICD-10-CM | POA: Insufficient documentation

## 2018-05-02 DIAGNOSIS — I25110 Atherosclerotic heart disease of native coronary artery with unstable angina pectoris: Secondary | ICD-10-CM | POA: Diagnosis not present

## 2018-05-02 DIAGNOSIS — K219 Gastro-esophageal reflux disease without esophagitis: Secondary | ICD-10-CM | POA: Insufficient documentation

## 2018-05-02 DIAGNOSIS — Z9049 Acquired absence of other specified parts of digestive tract: Secondary | ICD-10-CM | POA: Insufficient documentation

## 2018-05-02 DIAGNOSIS — N179 Acute kidney failure, unspecified: Secondary | ICD-10-CM | POA: Diagnosis not present

## 2018-05-02 DIAGNOSIS — Z87891 Personal history of nicotine dependence: Secondary | ICD-10-CM | POA: Diagnosis not present

## 2018-05-02 DIAGNOSIS — E876 Hypokalemia: Secondary | ICD-10-CM | POA: Diagnosis not present

## 2018-05-02 DIAGNOSIS — I5033 Acute on chronic diastolic (congestive) heart failure: Secondary | ICD-10-CM | POA: Diagnosis not present

## 2018-05-02 DIAGNOSIS — Z9889 Other specified postprocedural states: Secondary | ICD-10-CM | POA: Insufficient documentation

## 2018-05-02 DIAGNOSIS — Z9852 Vasectomy status: Secondary | ICD-10-CM | POA: Insufficient documentation

## 2018-05-02 DIAGNOSIS — Z951 Presence of aortocoronary bypass graft: Secondary | ICD-10-CM | POA: Diagnosis not present

## 2018-05-02 DIAGNOSIS — E781 Pure hyperglyceridemia: Secondary | ICD-10-CM | POA: Diagnosis not present

## 2018-05-02 DIAGNOSIS — Z8261 Family history of arthritis: Secondary | ICD-10-CM | POA: Diagnosis not present

## 2018-05-02 DIAGNOSIS — I110 Hypertensive heart disease with heart failure: Secondary | ICD-10-CM | POA: Diagnosis not present

## 2018-05-02 HISTORY — PX: LEFT HEART CATH AND CORS/GRAFTS ANGIOGRAPHY: CATH118250

## 2018-05-02 HISTORY — DX: Dyspnea, unspecified: R06.00

## 2018-05-02 HISTORY — PX: RIGHT/LEFT HEART CATH AND CORONARY ANGIOGRAPHY: CATH118266

## 2018-05-02 SURGERY — LEFT HEART CATH AND CORONARY ANGIOGRAPHY
Anesthesia: Moderate Sedation

## 2018-05-02 SURGERY — RIGHT/LEFT HEART CATH AND CORONARY ANGIOGRAPHY
Anesthesia: Moderate Sedation

## 2018-05-02 MED ORDER — SODIUM CHLORIDE 0.9 % WEIGHT BASED INFUSION: 1.0000 mL/kg/h | INTRAVENOUS | Status: DC

## 2018-05-02 MED ORDER — SODIUM CHLORIDE 0.9% FLUSH: 3.0000 mL | Freq: Two times a day (BID) | INTRAVENOUS | Status: DC

## 2018-05-02 MED ORDER — HEPARIN SODIUM (PORCINE) 1000 UNIT/ML IJ SOLN
INTRAMUSCULAR | Status: DC | PRN
  Administered 2018-05-02: 5000 [IU] via INTRAVENOUS

## 2018-05-02 MED ORDER — SODIUM CHLORIDE 0.9 % IV SOLN: INTRAVENOUS | Status: DC

## 2018-05-02 MED ORDER — VERAPAMIL HCL 2.5 MG/ML IV SOLN
INTRAVENOUS | Status: AC
  Filled 2018-05-02: qty 2

## 2018-05-02 MED ORDER — MIDAZOLAM HCL 2 MG/2ML IJ SOLN
INTRAMUSCULAR | Status: AC
  Filled 2018-05-02: qty 2

## 2018-05-02 MED ORDER — IOPAMIDOL (ISOVUE-300) INJECTION 61%
INTRAVENOUS | Status: DC | PRN
  Administered 2018-05-02: 85 mL via INTRA_ARTERIAL

## 2018-05-02 MED ORDER — MIDAZOLAM HCL 2 MG/2ML IJ SOLN
INTRAMUSCULAR | Status: DC | PRN
  Administered 2018-05-02 (×2): 1 mg via INTRAVENOUS

## 2018-05-02 MED ORDER — SODIUM CHLORIDE 0.9 % IV SOLN: 250.0000 mL | INTRAVENOUS | Status: DC | PRN

## 2018-05-02 MED ORDER — SODIUM CHLORIDE 0.9% FLUSH: 3.0000 mL | INTRAVENOUS | Status: DC | PRN

## 2018-05-02 MED ORDER — FENTANYL CITRATE (PF) 100 MCG/2ML IJ SOLN
INTRAMUSCULAR | Status: AC
  Filled 2018-05-02: qty 2

## 2018-05-02 MED ORDER — ACETAMINOPHEN 325 MG PO TABS
650.0000 mg | ORAL_TABLET | ORAL | Status: DC | PRN
  Administered 2018-05-02: 650 mg via ORAL

## 2018-05-02 MED ORDER — FENTANYL CITRATE (PF) 100 MCG/2ML IJ SOLN
INTRAMUSCULAR | Status: DC | PRN
  Administered 2018-05-02 (×2): 50 ug via INTRAVENOUS

## 2018-05-02 MED ORDER — VERAPAMIL HCL 2.5 MG/ML IV SOLN
INTRAVENOUS | Status: DC | PRN
  Administered 2018-05-02: 2.5 mg via INTRA_ARTERIAL

## 2018-05-02 MED ORDER — ONDANSETRON HCL 4 MG/2ML IJ SOLN: 4.0000 mg | Freq: Four times a day (QID) | INTRAMUSCULAR | Status: DC | PRN

## 2018-05-02 MED ORDER — SODIUM CHLORIDE 0.9 % WEIGHT BASED INFUSION
3.0000 mL/kg/h | INTRAVENOUS | Status: AC
  Administered 2018-05-02: 3 mL/kg/h via INTRAVENOUS

## 2018-05-02 MED ORDER — ACETAMINOPHEN 325 MG PO TABS
ORAL_TABLET | ORAL | Status: AC
  Filled 2018-05-02: qty 2

## 2018-05-02 MED ORDER — HEPARIN (PORCINE) IN NACL 1000-0.9 UT/500ML-% IV SOLN
INTRAVENOUS | Status: AC
  Filled 2018-05-02: qty 1000

## 2018-05-02 SURGICAL SUPPLY — 9 items
CATH INFINITI 5 FR IM (CATHETERS) ×4 IMPLANT
CATH INFINITI 5FR JL4 (CATHETERS) ×4 IMPLANT
CATH INFINITI 5FR TG (CATHETERS) ×4 IMPLANT
DEVICE RAD COMP TR BAND LRG (VASCULAR PRODUCTS) ×4 IMPLANT
GLIDESHEATH SLEND SS 6F .021 (SHEATH) ×4 IMPLANT
KIT MANI 3VAL PERCEP (MISCELLANEOUS) ×4 IMPLANT
KIT RIGHT HEART (MISCELLANEOUS) ×4 IMPLANT
PACK CARDIAC CATH (CUSTOM PROCEDURE TRAY) ×4 IMPLANT
WIRE ROSEN-J .035X260CM (WIRE) ×4 IMPLANT

## 2018-05-02 NOTE — Discharge Instructions (Signed)
Angiogram An angiogram, also called angiography, is a procedure used to look at the blood vessels. In this procedure, dye is injected through a long, thin tube (catheter) into an artery. X-rays are then taken. The X-rays will show if there is a blockage or problem in a blood vessel. Tell a health care provider about:  Any allergies you have, including allergies to shellfish or contrast dye.  All medicines you are taking, including vitamins, herbs, eye drops, creams, and over-the-counter medicines.  Any problems you or family members have had with anesthetic medicines.  Any blood disorders you have.  Any surgeries you have had.  Any previous kidney problems or failure you have had.  Any medical conditions you have.  Possibility of pregnancy, if this applies. What are the risks? Generally, an angiogram is a safe procedure. However, as with any procedure, problems can occur. Possible problems include:  Injury to the blood vessels, including rupture or bleeding.  Infection or bruising at the catheter site.  Allergic reaction to the dye or contrast used.  Kidney damage from the dye or contrast used.  Blood clots that can lead to a stroke or heart attack.  What happens before the procedure?  Do not eat or drink after midnight on the night before the procedure, or as directed by your health care provider.  Ask your health care provider if you may drink enough water to take any needed medicines the morning of the procedure. What happens during the procedure?  You may be given a medicine to help you relax (sedative) before and during the procedure. This medicine is given through an IV access tube that is inserted into one of your veins.  The area where the catheter will be inserted will be washed and shaved. This is usually done in the groin but may be done in the fold of your arm (near your elbow) or in the wrist.  A medicine will be given to numb the area where the catheter will  be inserted (local anesthetic).  The catheter will be inserted with a guide wire into an artery. The catheter is guided by using a type of X-ray (fluoroscopy) to the blood vessel being examined.  Dye is then injected into the catheter, and X-rays are taken. The dye helps to show where any narrowing or blockages are located. What happens after the procedure?  If the procedure is done through the leg, you will be kept in bed lying flat for several hours. You will be instructed to not bend or cross your legs.  The insertion site will be checked frequently.  The pulse in your feet or wrist will be checked frequently.  Additional blood tests, X-rays, and electrocardiography may be done.  You may need to stay in the hospital overnight for observation. This information is not intended to replace advice given to you by your health care provider. Make sure you discuss any questions you have with your health care provider. Document Released: 03/11/2005 Document Revised: 11/13/2015 Document Reviewed: 11/02/2012 Elsevier Interactive Patient Education  2017 [employer]  No driving for 48 hours after discharge.  After the procedure, check the insertion site occasionally.  If any oozing occurs or there is apparent swelling, firm pressure over the site will prevent a bruise from forming.  You can not hurt anything by pressing directly on the site.  The pressure stops the bleeding by allowing a small clot to form.  If the bleeding continues after the pressure has been applied for more  than 15 minutes, call 911 or go to the nearest emergency room.    The x-ray dye causes you to pass a considerate amount of urine.  For this reason, you will be asked to drink plenty of liquids after the procedure to prevent dehydration.  You may resume you regular diet.  Avoid caffeine products.    For pain at the site of your procedure, take non-aspirin medicines such as Tylenol.  Medications: A. Hold Metformin for 48  hours if applicable.  B. Continue taking all your present medications at home unless your doctor prescribes any changes.

## 2018-05-02 NOTE — Telephone Encounter (Signed)
S/w patient. States his urologist would like a letter stating it is ok for patient to have Urolift next week on 05/09/18. Heart cath was 05/02/18, today. Routing to Dr Mariah MillingGollan for clearance.

## 2018-05-02 NOTE — Interval H&P Note (Signed)
Cath Lab Visit (complete for each Cath Lab visit)  Clinical Evaluation Leading to the Procedure:   ACS: No.  Non-ACS:    Anginal Classification: CCS III  Anti-ischemic medical therapy: Maximal Therapy (2 or more classes of medications)  Non-Invasive Test Results: No non-invasive testing performed  Prior CABG: Previous CABG      History and Physical Interval Note:  05/02/2018 9:46 AM  Jonathon Newman  has presented today for surgery, with the diagnosis of RT and LT cath   Unstable angina  The various methods of treatment have been discussed with the patient and family. After consideration of risks, benefits and other options for treatment, the patient has consented to  Procedure(s): RIGHT/LEFT HEART CATH AND CORONARY ANGIOGRAPHY (Bilateral) LEFT HEART CATH AND CORS/GRAFTS ANGIOGRAPHY (N/A) as a surgical intervention .  [name] history has been reviewed, patient examined, no change in status, stable for surgery.  I have reviewed the patient's chart and labs.  Questions were answered to the patient's satisfaction.     Jonathon Newman

## 2018-05-02 NOTE — Telephone Encounter (Signed)
Patient friend came by office States that after cath Dr. Kirke CorinArida has cleared patient for upcoming procedure for urolift  Patient would like a letter stating he is [facility], states that [facility] to see if a letter will be sufficient  Please call to discuss

## 2018-05-06 ENCOUNTER — Encounter: Payer: Self-pay | Admitting: *Deleted

## 2018-05-06 ENCOUNTER — Encounter: Payer: Self-pay | Admitting: Emergency Medicine

## 2018-05-06 ENCOUNTER — Emergency Department
Admission: EM | Admit: 2018-05-06 | Discharge: 2018-05-07 | Disposition: A | Discharge status: Home / Self Care | Attending: Emergency Medicine | Admitting: Emergency Medicine

## 2018-05-06 ENCOUNTER — Other Ambulatory Visit: Payer: Self-pay

## 2018-05-06 DIAGNOSIS — Z79899 Other long term (current) drug therapy: Secondary | ICD-10-CM | POA: Insufficient documentation

## 2018-05-06 DIAGNOSIS — Z951 Presence of aortocoronary bypass graft: Secondary | ICD-10-CM | POA: Diagnosis not present

## 2018-05-06 DIAGNOSIS — M5416 Radiculopathy, lumbar region: Secondary | ICD-10-CM | POA: Diagnosis not present

## 2018-05-06 DIAGNOSIS — M5410 Radiculopathy, site unspecified: Secondary | ICD-10-CM

## 2018-05-06 DIAGNOSIS — Z87891 Personal history of nicotine dependence: Secondary | ICD-10-CM | POA: Insufficient documentation

## 2018-05-06 DIAGNOSIS — I110 Hypertensive heart disease with heart failure: Secondary | ICD-10-CM | POA: Diagnosis not present

## 2018-05-06 DIAGNOSIS — M545 Low back pain, unspecified: Secondary | ICD-10-CM

## 2018-05-06 DIAGNOSIS — I25119 Atherosclerotic heart disease of native coronary artery with unspecified angina pectoris: Secondary | ICD-10-CM | POA: Insufficient documentation

## 2018-05-06 DIAGNOSIS — G8929 Other chronic pain: Secondary | ICD-10-CM | POA: Diagnosis not present

## 2018-05-06 DIAGNOSIS — Z7982 Long term (current) use of aspirin: Secondary | ICD-10-CM | POA: Diagnosis not present

## 2018-05-06 DIAGNOSIS — I509 Heart failure, unspecified: Secondary | ICD-10-CM | POA: Insufficient documentation

## 2018-05-06 DIAGNOSIS — M25552 Pain in left hip: Secondary | ICD-10-CM | POA: Diagnosis present

## 2018-05-06 MED ORDER — BUPIVACAINE HCL (PF) 0.5 % IJ SOLN
30.0000 mL | Freq: Once | INTRAMUSCULAR | Status: AC
  Administered 2018-05-07: 30 mL
  Filled 2018-05-06: qty 30

## 2018-05-06 MED ORDER — OXYCODONE-ACETAMINOPHEN 5-325 MG PO TABS
2.0000 | ORAL_TABLET | Freq: Once | ORAL | Status: AC
  Administered 2018-05-07: 2 via ORAL
  Filled 2018-05-06: qty 2

## 2018-05-06 MED ORDER — KETOROLAC TROMETHAMINE 30 MG/ML IJ SOLN
30.0000 mg | Freq: Once | INTRAMUSCULAR | Status: AC
  Administered 2018-05-07: 30 mg via INTRAMUSCULAR
  Filled 2018-05-06: qty 1

## 2018-05-06 NOTE — Telephone Encounter (Signed)
Routing to Dr Mariah MillingGollan and Alycia Rossettiyan for clearance.

## 2018-05-06 NOTE — Telephone Encounter (Signed)
S[facility] at [facility] and let her know patient was cleared. She provided me with number of [phone] to fax encounter and clearance letter to. She was appreciative.

## 2018-05-06 NOTE — ED Triage Notes (Signed)
Pt arrives POV to triage with c/o left leg pain that radiates through the hip and down the left leg. Pt denies any falls or other injury to leg. Pt denies any lumps or swelling to leg. Pt is in NAD.

## 2018-05-06 NOTE — Telephone Encounter (Signed)
Letter signed by Alycia Rossettiyan and faxed to number provided [phone].

## 2018-05-06 NOTE — Telephone Encounter (Signed)
Patient surgery scheduled for 11/25 Monday and will be cancelled until January if no card clearance provided today   Please call to discuss [phone]

## 2018-05-06 NOTE — Telephone Encounter (Signed)
Please type up a letter indicating the patient had a cath on 05/02/2018 that showed significant underlying CAD involving the left main and RCA with patent bypass grafts including the LIMA to LAD and SVG to OM2 along with a patent RCA stent with minimal restenosis. There was no obvious cardiac culprit for his symptoms. He is low to moderate risk for non-cardiac surgery with a risk of 0.9% for cardiac events in the perioperative period.

## 2018-05-06 NOTE — Telephone Encounter (Signed)
Letter generated and faxed to number provided upon closing encounter in Epic.

## 2018-05-06 NOTE — ED Provider Notes (Signed)
[facility] Emergency Department Provider Note  ____________________________________________   First MD Initiated Contact with Patient 05/06/18 2352     (approximate)  I have reviewed the triage vital signs and the nursing notes.   HISTORY  Chief Complaint Leg Pain    HPI Jonathon Newman is a 62 y.o. male who self presents to the emergency department with left lateral hip pain radiating down the lateral aspect of his left leg towards his toes for the past several weeks.  Symptoms became acutely worse this evening which prompted the visit.  His wife is a massage therapist and she has been massaging his hip with minimal to no relief.  The patient denies trauma.  He has difficulty ambulating.  He has a long history of low back pain and has had a previous laminectomy and fusion.  He denies fevers or chills.  He denies bowel or bladder incontinence or hesitance.  He denies IV drug use.  He says that "opiates do not really work for me very well".  Pain is worse when walking and improved with rest.    Past Medical History:  Diagnosis Date  . Arthritis   . CHF (congestive heart failure) (HCC)   . Coronary artery disease    PCI and Cypher drug-eluting stent placement to the right coronary artery in 2010.  NSTEMI in 2016.  Cardiac catheterization showed patent RCA stent, significant distal left main disease with an FFR ratio of 0.76 and ostial left circumflex stenosis.  Underwent CABG with LIMA to LAD and SVG to OM 2.  . Dyspnea   . GERD (gastroesophageal reflux disease)   . Hypercholesteremia   . Hypertension   . S/P coronary artery bypass graft x 2   . Sleep apnea    uses CPAP    Patient Active Problem List   Diagnosis Date Noted  . Unstable angina (HCC) 04/25/2018  . SOB (shortness of breath) 04/13/2018  . Dizziness 04/13/2018  . S/P coronary artery bypass graft x 2   . Incomplete bladder emptying 07/21/2017  . Low back pain 07/09/2017  . Lumbar  radiculopathy 07/09/2017  . Gastroesophageal reflux disease with esophagitis 07/05/2017  . Congestive heart failure (HCC) 07/05/2017  . Benign prostatic hyperplasia with weak urinary stream 07/05/2017  . Dysphagia 07/05/2017  . DDD (degenerative disc disease), lumbar 07/05/2017  . Coronary artery disease involving native coronary artery of native heart with angina pectoris (HCC) 07/31/2015  . Benign essential HTN 07/31/2015  . Hyperlipidemia 07/31/2015  . OSA (obstructive sleep apnea) 07/31/2015    Past Surgical History:  Procedure Laterality Date  . APPENDECTOMY    . BACK SURGERY  11/01/2017   SL 5 and S1  . CARDIAC CATHETERIZATION    . CARPAL TUNNEL RELEASE     left hand  . CHOLECYSTECTOMY    . CHOLECYSTECTOMY    . CORONARY ARTERY BYPASS GRAFT  04/21/2015   CABG x 2 [place] V.A. LIMA to LAD amd SVG to OM2  . CORONARY STENT PLACEMENT  2010   Cordis Cypher Sirolimus-eluting stent 2.50 mm x 26 mm [facility]   . ESOPHAGEAL MANOMETRY N/A 08/04/2017   Procedure: ESOPHAGEAL MANOMETRY (EM);  Surgeon: Toney ReilVanga, Rohini Reddy, MD;  Location: [facility] ENDOSCOPY;  Service: Endoscopy;  Laterality: N/A;  . FRACTURE SURGERY    . KNEE SURGERY    . KNEE SURGERY     right knee   . LAMINECTOMY     "plate in neck  C3-C4"  . LEFT HEART CATH AND CORS/GRAFTS ANGIOGRAPHY N/A 05/02/2018   Procedure: CORS/GRAFTS ANGIOGRAPHY;  Surgeon: Iran Ouch, MD;  Location: [facility] INVASIVE CV LAB;  Service: Cardiovascular;  Laterality: N/A;  . RIGHT/LEFT HEART CATH AND CORONARY ANGIOGRAPHY Bilateral 05/02/2018   Procedure: LEFT HEART CATH;  Surgeon: Iran Ouch, MD;  Location: [facility] INVASIVE CV LAB;  Service: Cardiovascular;  Laterality: Bilateral;  . VASECTOMY      Prior to Admission medications   Medication Sig Start Date End Date Taking? Authorizing Provider  amLODipine (NORVASC) 5 MG tablet Take 1 tablet (5 mg total) by mouth daily. 03/15/18   Margaretann Loveless,  PA-C  aspirin 81 MG tablet Take 81 mg by mouth daily.    [provider]  atorvastatin (LIPITOR) 40 MG tablet Take 1 tablet (40 mg total) by mouth daily. 09/09/17 04/27/18  Iran Ouch, MD  carvedilol (COREG) 25 MG tablet Take 1 tablet (25 mg total) by mouth 2 (two) times daily with a meal. 02/21/18   Burnette, Alessandra Bevels, PA-C  Icosapent Ethyl (VASCEPA) 1 g CAPS Take 2 capsules (2 g total) by mouth 2 (two) times daily. Patient taking differently: Take 1 g by mouth 2 (two) times daily.  09/14/17   Iran Ouch, MD  isosorbide mononitrate (IMDUR) 30 MG 24 hr tablet Take 1 tablet (30 mg total) by mouth daily. 04/13/18   Antonieta Iba, MD  lidocaine (LIDODERM) 5 % Place 1 patch onto the skin every 12 (twelve) hours. Remove & Discard patch within 12 hours or as directed by MD 05/07/18 05/07/19  Merrily Brittle, MD  losartan (COZAAR) 100 MG tablet Take 1 tablet (100 mg total) by mouth daily. 02/21/18   Margaretann Loveless, PA-C  Multiple Vitamin (MULTIVITAMIN WITH MINERALS) TABS tablet Take 1 tablet by mouth daily.    [provider]  naproxen sodium (ALEVE) 220 MG tablet Take 440 mg by mouth 2 (two) times daily as needed (mild pain).    [provider]  oxyCODONE (ROXICODONE) 15 MG immediate release tablet Take 1 tablet (15 mg total) by mouth every 4 (four) hours as needed for pain. 05/07/18   Merrily Brittle, MD  pantoprazole (PROTONIX) 20 MG tablet Take 2 tablets (40 mg total) by mouth 2 (two) times daily. 04/22/18   Margaretann Loveless, PA-C  potassium chloride (K-DUR) 10 MEQ tablet Take 1 tablet (10 mEq total) by mouth 2 (two) times daily as needed. Patient taking differently: Take 10 mEq by mouth See admin instructions. Take one every AM. Take two with every dose of metolazone. 04/13/18   Antonieta Iba, MD  Sennosides (SENNA-EX PO) Take 1 tablet by mouth 2 (two) times daily.     [provider]  sildenafil (VIAGRA) 100 MG tablet Take 100 mg by mouth  daily as needed for erectile dysfunction.    [provider]  torsemide (DEMADEX) 20 MG tablet Take 2 tablets (40 mg total) by mouth 2 (two) times daily. Patient taking differently: Take 40 mg by mouth 2 (two) times daily with breakfast and lunch.  04/13/18   Antonieta Iba, MD  traMADol (ULTRAM) 50 MG tablet Take 100 mg by mouth every 12 (twelve) hours as needed for moderate pain.     [provider]    Allergies Lisinopril  Family History  Problem Relation Age of Onset  . Arthritis Mother   . Hyperlipidemia Father   . Hypertension Father   . Heart attack Father 24  .  Heart murmur Brother   . Valvular heart disease Brother   . Cataracts Maternal Grandmother   . Glaucoma Maternal Grandmother   . Cancer Maternal Grandfather   . Heart attack Paternal Grandfather   . Hypertension Paternal Grandfather   . Prostate cancer Neg Hx   . Bladder Cancer Neg Hx   . Kidney cancer Neg Hx     Social History Social History   Tobacco Use  . Smoking status: Former Smoker    Packs/day: 0.25    Years: 2.00    Pack years: 0.50    Types: Cigarettes    Last attempt to quit: 05/26/2005    Years since quitting: 12.9  . Smokeless tobacco: Former Neurosurgeon    Types: Chew  Substance Use Topics  . Alcohol use: No  . Drug use: No    Review of Systems Constitutional: No fever/chills Eyes: No visual changes. ENT: No sore throat. Cardiovascular: Denies chest pain. Respiratory: Denies shortness of breath. Gastrointestinal: No abdominal pain.  No nausea, no vomiting.  No diarrhea.  No constipation. Genitourinary: Negative for dysuria. Musculoskeletal: Positive for back pain. Skin: Negative for rash. Neurological: Negative for headaches, focal weakness or numbness.   ____________________________________________   PHYSICAL EXAM:  VITAL SIGNS: ED Triage Vitals [05/06/18 2308]  Enc Vitals Group     BP (!) 157/77     Pulse Rate 70     Resp 16     Temp 97.7 F (36.5 C)      Temp Source Oral     SpO2 94 %     Weight 225 lb (102.1 kg)     Height 5\' 7"  (1.702 m)     Head Circumference      Peak Flow      Pain Score 10     Pain Loc      Pain Edu?      Excl. in GC?     Constitutional: Alert and oriented x4 appears quite uncomfortable although nontoxic no diaphoresis speaks in full clear sentences Eyes: PERRL EOMI. Head: Atraumatic. Nose: No congestion/rhinnorhea. Mouth/Throat: No trismus Neck: No stridor.   Cardiovascular: Normal rate, regular rhythm. Grossly normal heart sounds.  Good peripheral circulation. Respiratory: Normal respiratory effort.  No retractions. Lungs CTAB and moving good air Gastrointestinal: Soft nontender Musculoskeletal: Midline back tenderness.  Somewhat tender left lumbar.  Quite tender left hip laterally over trochanteric bursa.  Negative logroll test.  Neurovascularly intact. Neurologic:  Normal speech and language. No gross focal neurologic deficits are appreciated. Skin:  Skin is warm, dry and intact. No rash noted. Psychiatric: Mood and affect are normal. Speech and behavior are normal.    ____________________________________________   DIFFERENTIAL includes but not limited to  Trochanteric bursitis, sacroiliitis, sciatica, radiculopathy, septic joint, epidural abscess ____________________________________________   LABS (all labs ordered are listed, but only abnormal results are displayed)  Labs Reviewed - No data to display   __________________________________________  EKG   ____________________________________________  RADIOLOGY  X-ray of the left hip reviewed by me with no acute disease MRI of the lumbar spine reviewed by me with moderate left-sided neuroforaminal stenosis ____________________________________________   PROCEDURES  Procedure(s) performed: Yes  .Nerve Block Date/Time: 05/06/2018 1:14 AM Performed by: Merrily Brittle, MD Authorized by: Merrily Brittle, MD   Consent:    Consent  obtained:  Verbal   Consent given by:  Patient   Risks discussed:  Nerve damage, pain and unsuccessful block   Alternatives discussed:  Alternative treatment Indications:    Indications:  Pain relief Location:    Body area:  Lower extremity   Lower extremity nerve blocked: Left trochanteric bursa. Pre-procedure details:    Skin preparation:  Alcohol Procedure details (see MAR for exact dosages):    Block needle gauge:  20 G   Anesthetic injected:  Bupivacaine 0.5% w/o epi   Steroid injected:  None   Additive injected:  None   Injection procedure:  Anatomic landmarks identified, anatomic landmarks palpated, incremental injection and negative aspiration for blood   Paresthesia:  None Post-procedure details:    Dressing:  None   Outcome:  Pain unchanged   Patient tolerance of procedure:  Tolerated well, no immediate complications    Critical Care performed: no  ____________________________________________   INITIAL IMPRESSION / ASSESSMENT AND PLAN / ED COURSE  Pertinent labs & imaging results that were available during my care of the patient were reviewed by me and considered in my medical decision making (see chart for details).   As part of my medical decision making, I reviewed the following data within the electronic MEDICAL RECORD NUMBER History obtained from family if available, nursing notes, old chart and ekg, as well as notes from prior ED visits.      ----------------------------------------- 12:50 AM on 05/07/2018 -----------------------------------------  After injecting the trochanteric bursa and 2 of Percocet and 30 of Toradol the patient's pain is unchanged.  I will give a trial of 2 mg of intramuscular Dilaudid now along with 5 mg of oral Valium.  Given the severity of his symptoms and his previous history of do think he requires an MRI of his lumbar spine today.  Fortunately he remains neuro intact.  ____________________________________________ [name] MRI  shows moderate neuroforaminal stenosis on the left.  I had a lengthy discussion with the patient and the wife regarding his symptoms and that I do not think he requires emergent surgery however he clearly has significant pain.  I offered him inpatient admission for orthopedic evaluation and pain control however they declined stating he would prefer to go home.  He is here to follow-up in pain clinic so we will help arrange it.  Its entirely possible that opiates have not worked for him because he has not had a high enough dose will give 15 mg of instant release oxycodone now and write a short course for home.  Strict return precautions have been given.  FINAL CLINICAL IMPRESSION(S) / ED DIAGNOSES  Final diagnoses:  Radicular low back pain  Chronic left-sided low back pain without sciatica      NEW MEDICATIONS STARTED DURING THIS VISIT:  Discharge Medication List as of 05/07/2018  2:21 AM    START taking these medications   Details  lidocaine (LIDODERM) 5 % Place 1 patch onto the skin every 12 (twelve) hours. Remove & Discard patch within 12 hours or as directed by MD, Starting Sat 05/07/2018, Until Sun 05/07/2019, Print    oxyCODONE (ROXICODONE) 15 MG immediate release tablet Take 1 tablet (15 mg total) by mouth every 4 (four) hours as needed for pain., Starting Sat 05/07/2018, Print         Note:  This document was prepared using Dragon voice recognition software and may include unintentional dictation errors.     Merrily Brittle, MD 05/09/18 [phone]

## 2018-05-07 ENCOUNTER — Emergency Department

## 2018-05-07 MED ORDER — LIDOCAINE 5 % EX PTCH: 1.0000 | MEDICATED_PATCH | Freq: Two times a day (BID) | CUTANEOUS | 0 refills | Status: DC

## 2018-05-07 MED ORDER — DIAZEPAM 5 MG PO TABS
ORAL_TABLET | ORAL | Status: AC
  Filled 2018-05-07: qty 1

## 2018-05-07 MED ORDER — OXYCODONE HCL 15 MG PO TABS: 15.0000 mg | ORAL_TABLET | ORAL | 0 refills | Status: DC | PRN

## 2018-05-07 MED ORDER — OXYCODONE HCL 5 MG PO TABS
15.0000 mg | ORAL_TABLET | Freq: Once | ORAL | Status: AC
  Administered 2018-05-07: 15 mg via ORAL
  Filled 2018-05-07: qty 3

## 2018-05-07 MED ORDER — HYDROMORPHONE HCL 1 MG/ML IJ SOLN
INTRAMUSCULAR | Status: AC
  Filled 2018-05-07: qty 2

## 2018-05-07 MED ORDER — HYDROMORPHONE HCL 1 MG/ML IJ SOLN
2.0000 mg | Freq: Once | INTRAMUSCULAR | Status: AC
  Administered 2018-05-07: 2 mg via INTRAMUSCULAR

## 2018-05-07 MED ORDER — DIAZEPAM 5 MG PO TABS
5.0000 mg | ORAL_TABLET | Freq: Once | ORAL | Status: AC
  Administered 2018-05-07: 5 mg via ORAL

## 2018-05-07 NOTE — ED Notes (Signed)
Pt states he is going to take oxy 1r 15mg  given in ed when he gets home at 0400.

## 2018-05-07 NOTE — ED Notes (Signed)
Explanation of MRI procedure explained to pt who verbalizes understanding.

## 2018-05-07 NOTE — Discharge Instructions (Addendum)
Please take your oxycodone as needed for severe symptoms and make an appointment for pain clinic this coming week for a reevaluation.  Please return to the emergency department sooner for any new or worsening symptoms such as worsening pain, if you cannot walk, numbness, weakness, or for any other issues whatsoever.  It was a pleasure to take care of you today, and thank you for coming to our emergency department.  If you have any questions or concerns before leaving please ask the nurse to grab me and I'm more than happy to go through your aftercare instructions again.  If you were prescribed any opioid pain medication today such as Norco, Vicodin, Percocet, morphine, hydrocodone, or oxycodone please make sure you do not drive when you are taking this medication as it can alter your ability to drive safely.  If you have any concerns once you are home that you are not improving or are in fact getting worse before you can make it to your follow-up appointment, please do not hesitate to call 911 and come back for further evaluation.  Merrily Brittle, MD  While here in the ER today you received very powerful medicine that makes it unsafe for you to drive for the rest of the day.  Do not drive until tomorrow.   Dg Lumbar Spine Complete  Result Date: 05/07/2018 CLINICAL DATA:  Radicular LEFT low back pain. No known injury. EXAM: LUMBAR SPINE - COMPLETE 4+ VIEW COMPARISON:  None. FINDINGS: Alignment is within normal limits. Bone mineralization is normal. No fracture line or displaced fracture fragment appreciated. No acute or suspicious osseous lesion. Disc spaces are well maintained in height. Degenerative facet hypertrophy noted within the mid and lower lumbar spine, mild to moderate in degree, most prominent at L5-S1. No evidence of pars interarticularis defect. Cholecystectomy clips in the RIGHT upper quadrant. Foreign body within the RIGHT lower quadrant, most likely within bowel. Atherosclerotic  changes are seen along the walls of the infrarenal abdominal aorta. IMPRESSION: 1. No acute appearing osseous abnormality. 2. 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 3. Linear foreign body within the RIGHT lower quadrant, most likely within bowel, most suggestive of an electronic device (capsule endoscopy?). Clinical/procedural correlation recommended. 4. Aortic atherosclerosis. Electronically Signed   By: Bary Richard M.D.   On: 05/07/2018 00:42   Mr Lumbar Spine Wo Contrast  Result Date: 05/07/2018 CLINICAL DATA:  Left leg pain and low back pain. Recent lumbar spine surgery. EXAM: MRI LUMBAR SPINE WITHOUT CONTRAST TECHNIQUE: Multiplanar, multisequence MR imaging of the lumbar spine was performed. No intravenous contrast was administered. COMPARISON:  Lumbar spine radiograph 05/07/2018 FINDINGS: Segmentation: Normal. The lowest disc space is considered to be L5-S1. Alignment:  Normal Vertebrae: No acute compression fracture, discitis-osteomyelitis of focal marrow lesion. Conus medullaris and cauda equina: The conus medullaris terminates at the L1 level. The cauda equina and conus medullaris are both normal. Paraspinal and other soft tissues: The visualized aorta, IVC and iliac vessels are normal. The visualized retroperitoneal organs and paraspinal soft tissues are normal. Disc levels: Sagittal plane imaging includes the T12-L1 disc level through the upper sacrum, with axial imaging of the L1-2 to L5-S1 disc levels. T12-L1: Normal. L1-2: Normal. L2-3: Normal. L3-4: Normal. L4-5: Disc desiccation with small bulge. Mild facet hypertrophy. No spinal canal stenosis. Moderate left foraminal stenosis. L5-S1: Status post left L5 hemilaminectomy. Disc desiccation with minimal bulge. No spinal canal stenosis. Moderate left foraminal stenosis. The visualized portion of the sacrum is  normal. IMPRESSION: 1. 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 2. Status post  left L5 hemilaminectomy. Electronically Signed   By: Deatra RobinsonKevin  Herman M.D.   On: 05/07/2018 02:00   Dg Hip Unilat With Pelvis 2-3 Views Left  Result Date: 05/07/2018 CLINICAL DATA:  LEFT hip pain and low back pain for 1 week. EXAM: DG HIP (WITH OR WITHOUT PELVIS) 2-3V LEFT COMPARISON:  Plain film of the pelvis and LEFT hip dated 06/30/2017. FINDINGS: Osseous alignment is normal. Bone mineralization is normal. No fracture line or displaced fracture fragment seen. No acute or suspicious osseous lesion. No significant degenerative change at either hip joint. Soft tissues about the LEFT hip are unremarkable. The metallic foreign body within the RIGHT lower quadrant is now appreciated as a stable/chronic finding, stable in position compared to the earlier plain film of 06/30/2017, compatible with a surgical clip, likely related to patient's history of appendectomy. IMPRESSION: 1. No acute findings. No significant degenerative change. 2. The metallic foreign body within the RIGHT lower quadrant is now appreciated as a stable/chronic finding, stable in position compared to the earlier plain film of 06/30/2017, compatible with a surgical clip, likely related to patient's history of appendectomy. Electronically Signed   By: Bary RichardStan  Maynard M.D.   On: 05/07/2018 01:09

## 2018-05-07 NOTE — ED Notes (Signed)
Patient transported to MRI 

## 2018-05-16 ENCOUNTER — Telehealth: Payer: Self-pay | Admitting: Physician Assistant

## 2018-05-16 DIAGNOSIS — M48061 Spinal stenosis, lumbar region without neurogenic claudication: Secondary | ICD-10-CM

## 2018-05-16 MED ORDER — OXYCODONE HCL 15 MG PO TABS: 15.0000 mg | ORAL_TABLET | Freq: Four times a day (QID) | ORAL | 0 refills | Status: DC | PRN

## 2018-05-16 MED ORDER — TRAMADOL HCL 50 MG PO TABS: 100.0000 mg | ORAL_TABLET | Freq: Two times a day (BID) | ORAL | 0 refills | Status: DC | PRN

## 2018-05-16 NOTE — Progress Notes (Signed)
Cardiology Office Note Date:  05/18/2018  Patient ID:  Jonathon Newman, DOB February 12, 1956, MRN [id number] PCP:  Margaretann Loveless, PA-C  Cardiologist:  Dr. Kirke Corin, MD    Chief Complaint: Follow up  History of Present Illness: Jonathon Newman is a 63 y.o. male with history of CAD s/p 2-vessel CABG in 2016 at the [facility], HFpEF, HTN, HLD, sleep apnea on CPAP, and prior tobacco abuse who presents for follow-up of recent diagnostic cath.   Patient is retired from [employer] though is no longer eligible for his care through the [employer]. He previously underwent Cypher drug-eluting stent to the RCA in 2010. This was followed by a NSTEMI in 2016 with cardiac cath showing a patent RCA stent with significant distal left main disease with an FFR of 0.76 and ostial LCx stenosis. He underwent 2-vessel CABG with a LIMA to LAD and SVG to OM2 in 2016. He was seen in 06/2017 for atypical chest pain and exertional dyspnea. He underwent Lexiscan Myoview on 07/08/2017 that showed no evidence of ischemia with a normal EF. When he was evaluated in 08/2017, he noted some improvement in symptoms but continued to have mild dyspnea and leg edema that was worse at the end of the day. He was taking torsemide 40 mg in the morning and 20 mg in the evening. Weight at that visit was 231 pounds which was up 3 pounds from 06/2017 visit. Labs checked in 08/2017 showed a BNP of 8, SCr 1.04, K+ 3.6, AST 50, ALT 77, TG 386, LDL 54. His Lipitor was decreased to 40 mg and Vascepa was added.   Patient was seen as an add-on on 04/13/18 by Dr. Mariah Milling noting a 12-pound weight gain. Weight of 230 pounds at that visit. Patient reported a dry weight of ~ 220 pounds. Recent weight in 01/2018 at PCP office of 224 pounds. He reported compliance with medications and denied excess fluids or salt intake. He continued to take torsemide 40 mg in the morning and 20 mg in the afternoon with an occasional extra dose before bed. He  reported compliance with his CPAP. He noted dyspnea with ambulation of 50-70 feet. BP was running high in the 160-180 systolic range. He also noted dizziness in which he was concerned may be ischemia. Prior ENT workup was negative for vertigo. He was started on metolazone 5 mg for weight > 230 pounds and 2.5 mg for weight > 227 pounds, with recommendation to take torsemide 40 mg bid. He was started on KCl 10 mEq daily with recommendation to take KCl 30 mEq on days he takes metolazone. He was also started on Imdur.  Follow-up BMP on 04/20/2018 showed slight bump in serum creatinine to 1.31 with a baseline approximately 1.0, and mild hypokalemia with a potassium of 3.4.  On 11/10 patient was advised to hold off on metolazone and to take an extra 2 doses of KCl. Patient was seen in the office on 04/25/2018, noting significant improvement in his lower extremity edema, though continued to note abdominal distension, dizziness, and exertional chest pressure. He was concerned these symptoms were his anginal equivalent. He continued to take metolazone 5 mg on a daily basis, regardless of his home weight. His weight at his 11/11/ visit was noted to be 221 pounds. He was advised to hold metolazone. He requested a cardiac cath, though this was deferred until we could assess the stability of his renal function. Recheck bmet on 04/25/18 showed a worsening SCR  from 1.31-->1.53 with a potassium of 3.4. At that time, he was advised to hold torsemide as well. Recheck bmet on 04/29/18 showed a SCr of 1.05 with a K+ of 3.5. He underwent diagnostic LHC on 05/02/2018 that showed significant underlying left main disease with patent grafts including LIMA to LAD and SVG to OM2. The RCA stent was patent with minimal restenosis. He had normal LVSF with a mildly elevated LVEDP at 14-15 mmHg. There was no culprit for his symptoms based on this cath. Medical therapy was advised.   He has subsequently been seen in the ED on 11/22 for radicular  back pain with MRI of the spine showing moderate left L4-5 and L5-S1 neuro-foraminal stenosis.  He comes in accompanied by his wife today.  He continues to note intermittent dizziness that is not associated with positional changes.  At times there are associated palpitations with this.  He indicates he has previously worn outpatient cardiac monitoring through the [place] [facility] that was unrevealing for significant arrhythmia in the setting of his dizziness.  He reports intermittent mild lower extremity swelling that is stable when compared to his last office visit.  He remains on torsemide 40 mg twice daily as well as amlodipine 5 mg daily, carvedilol 25 mg twice daily, losartan 100 mg daily, and Imdur 30 mg daily.  Weight remains stable at home at approximately 200 to 221 pounds.  He has not yet seen pulmonology for potential repeat sleep study.  He does not have any further exertional chest pressure.  He denies any abdominal distention, orthopnea, or early satiety.  He is bothered significantly with low back pain with radiculopathy.  Because of this, he is having to sleep in a recliner.  He has been referred to orthopedics by PCP for potential repeat back surgery.   Past Medical History:  Diagnosis Date  . Arthritis   . CHF (congestive heart failure) (HCC)   . Coronary artery disease    PCI and Cypher drug-eluting stent placement to the right coronary artery in 2010.  NSTEMI in 2016.  Cardiac catheterization showed patent RCA stent, significant distal left main disease with an FFR ratio of 0.76 and ostial left circumflex stenosis.  Underwent CABG with LIMA to LAD and SVG to OM 2.  . Dyspnea   . GERD (gastroesophageal reflux disease)   . Hypercholesteremia   . Hypertension   . S/P coronary artery bypass graft x 2   . Sleep apnea    uses CPAP    Past Surgical History:  Procedure Laterality Date  . APPENDECTOMY    . BACK SURGERY  11/01/2017   SL 5 and S1  . CARDIAC CATHETERIZATION    .  CARPAL TUNNEL RELEASE     left hand  . CHOLECYSTECTOMY    . CHOLECYSTECTOMY    . CORONARY ARTERY BYPASS GRAFT  04/21/2015   CABG x 2 [facility] LIMA to LAD amd SVG to OM2  . CORONARY STENT PLACEMENT  2010   Cordis Cypher Sirolimus-eluting stent 2.50 mm x 26 mm placed to the RCA at [facility]   . ESOPHAGEAL MANOMETRY N/A 08/04/2017   Procedure: ESOPHAGEAL MANOMETRY (EM);  Surgeon: Toney Reil, MD;  Location: [facility] ENDOSCOPY;  Service: Endoscopy;  Laterality: N/A;  . FRACTURE SURGERY    . KNEE SURGERY    . KNEE SURGERY     right knee   . LAMINECTOMY     "plate in neck Z6-X0"  . LEFT HEART CATH AND  CORS/GRAFTS ANGIOGRAPHY N/A 05/02/2018   Procedure: CORS/GRAFTS ANGIOGRAPHY;  Surgeon: Iran Ouch, MD;  Location: [facility] INVASIVE CV LAB;  Service: Cardiovascular;  Laterality: N/A;  . RIGHT/LEFT HEART CATH AND CORONARY ANGIOGRAPHY Bilateral 05/02/2018   Procedure: LEFT HEART CATH;  Surgeon: Iran Ouch, MD;  Location: [facility] INVASIVE CV LAB;  Service: Cardiovascular;  Laterality: Bilateral;  . VASECTOMY      Current Meds  Medication Sig  . amLODipine (NORVASC) 5 MG tablet Take 1 tablet (5 mg total) by mouth daily.  Marland Kitchen aspirin 81 MG tablet Take 81 mg by mouth daily.  Marland Kitchen atorvastatin (LIPITOR) 40 MG tablet Take 1 tablet (40 mg total) by mouth daily.  . carvedilol (COREG) 25 MG tablet Take 1 tablet (25 mg total) by mouth 2 (two) times daily with a meal.  . isosorbide mononitrate (IMDUR) 30 MG 24 hr tablet Take 1 tablet (30 mg total) by mouth daily.  Marland Kitchen lidocaine (LIDODERM) 5 % Place 1 patch onto the skin every 12 (twelve) hours. Remove & Discard patch within 12 hours or as directed by MD  . losartan (COZAAR) 100 MG tablet Take 1 tablet (100 mg total) by mouth daily.  . Multiple Vitamin (MULTIVITAMIN WITH MINERALS) TABS tablet Take 1 tablet by mouth daily.  . naproxen sodium (ALEVE) 220 MG tablet Take 440 mg by mouth 2 (two) times daily as needed (mild pain).    Marland Kitchen oxyCODONE (ROXICODONE) 15 MG immediate release tablet Take 1 tablet (15 mg total) by mouth every 6 (six) hours as needed for pain.  . pantoprazole (PROTONIX) 20 MG tablet Take 2 tablets (40 mg total) by mouth 2 (two) times daily.  . potassium chloride (K-DUR) 10 MEQ tablet Take 1 tablet (10 mEq total) by mouth 2 (two) times daily as needed. (Patient taking differently: Take 10 mEq by mouth See admin instructions. Take one every AM. Take two with every dose of metolazone.)  . predniSONE (DELTASONE) 10 MG tablet Take 6 tabs PO on day 1&2, 5 tabs PO on day 3&4, 4 tabs PO on day 5&6, 3 tabs PO on day 7&8, 2 tabs PO on day 9&10, 1 tab PO on day 11&12.  . Sennosides (SENNA-EX PO) Take 1 tablet by mouth 2 (two) times daily.   . sildenafil (VIAGRA) 100 MG tablet Take 100 mg by mouth daily as needed for erectile dysfunction.  . torsemide (DEMADEX) 20 MG tablet Take 2 tablets (40 mg total) by mouth 2 (two) times daily. (Patient taking differently: Take 40 mg by mouth 2 (two) times daily with breakfast and lunch. )  . traMADol (ULTRAM) 50 MG tablet Take 2 tablets (100 mg total) by mouth every 6 (six) hours as needed for moderate pain.    Allergies:   Lisinopril   Social History:  The patient  reports that he quit smoking about 12 years ago. His smoking use included cigarettes. He has a 0.50 pack-year smoking history. He has quit using smokeless tobacco.  His smokeless tobacco use included chew. He reports that he does not drink alcohol or use drugs.   Family History:  [name] family history includes Arthritis in his mother; Cancer in his maternal grandfather; Cataracts in his maternal grandmother; Glaucoma in his maternal grandmother; Heart attack in his paternal grandfather; Heart attack (age of onset: 60) in his father; Heart murmur in his brother; Hyperlipidemia in his father; Hypertension in his father and paternal grandfather; Valvular heart disease in his brother.  ROS:   Review of Systems  Constitutional: Positive for malaise/fatigue. Negative for chills, diaphoresis, fever and weight loss.  HENT: Negative for congestion.   Eyes: Negative for discharge and redness.  Respiratory: Negative for cough, hemoptysis, sputum production, shortness of breath and wheezing.   Cardiovascular: Positive for leg swelling. Negative for chest pain, palpitations, orthopnea, claudication and PND.  Gastrointestinal: Negative for abdominal pain, blood in stool, heartburn, melena, nausea and vomiting.  Genitourinary: Negative for hematuria.  Musculoskeletal: Negative for falls and myalgias.  Skin: Negative for rash.  Neurological: Positive for dizziness and weakness. Negative for tingling, tremors, sensory change, speech change, focal weakness and loss of consciousness.  Endo/Heme/Allergies: Does not bruise/bleed easily.  Psychiatric/Behavioral: Negative for substance abuse. The patient is not nervous/anxious.   All other systems reviewed and are negative.    PHYSICAL EXAM:  VS:  BP 120/62 (BP Location: Left Arm, Patient Position: Sitting, Cuff Size: Normal)   Pulse 60   Ht 5\' 7"  (1.702 m)   Wt 221 lb 4 oz (100.4 kg)   BMI 34.65 kg/m  BMI: Body mass index is 34.65 kg/m.  Physical Exam  Constitutional: He is oriented to person, place, and time. He appears well-developed and well-nourished.  HENT:  Head: Normocephalic and atraumatic.  Eyes: Right eye exhibits no discharge. Left eye exhibits no discharge.  Neck: Normal range of motion. No JVD present.  Cardiovascular: Normal rate, regular rhythm, S1 normal, S2 normal and normal heart sounds. Exam reveals no distant heart sounds, no friction rub, no midsystolic click and no opening snap.  No murmur heard. Pulses:      Posterior tibial pulses are 2+ on the right side, and 2+ on the left side.  Cardiac cath site is well-healing without any bleeding, bruising, swelling, erythema, warmth, or tenderness to palpation.  Radial pulse 2+. There is no  appreciable lower extremity swelling on exam bilaterally.  Pulmonary/Chest: Effort normal and breath sounds normal. No respiratory distress. He has no decreased breath sounds. He has no wheezes. He has no rales. He exhibits no tenderness.  Abdominal: Soft. He exhibits no distension. There is no tenderness.  Musculoskeletal: He exhibits no edema.  Neurological: He is alert and oriented to person, place, and time.  Skin: Skin is warm and dry. No cyanosis. Nails show no clubbing.  Psychiatric: He has a normal mood and affect. His speech is normal and behavior is normal. Judgment and thought content normal.     EKG:  Was ordered and interpreted by me today. Shows NSR, 60 bpm, normal axis, prior inferior infarct no acute ST-T changes  Recent Labs: 09/08/2017: B Natriuretic Peptide 8.0 02/02/2018: ALT 51; TSH 1.600 04/25/2018: Hemoglobin 14.9; Platelets 288 04/29/2018: BUN 20; Creatinine, Ser 1.05; Potassium 3.5; Sodium 141  09/08/2017: VLDL 77 02/02/2018: Chol/HDL Ratio 5.3; Cholesterol, Total 185; HDL 35; LDL Calculated Comment; Triglycerides 692   Estimated Creatinine Clearance: 83.4 mL/min (by C-G formula based on SCr of 1.05 mg/dL).   Wt Readings from Last 3 Encounters:  05/18/18 221 lb 4 oz (100.4 kg)  05/18/18 221 lb (100.2 kg)  05/06/18 225 lb (102.1 kg)     Other studies reviewed: Additional studies/records reviewed today include: summarized above  ASSESSMENT AND PLAN:  1. CAD status post CABG with stable angina: No symptoms concerning for angina.  Recent cardiac catheterization in 04/2018 demonstrated patent bypass grafts and patent RCA stent.  His symptoms do not appear to be ischemic in etiology.  Recommend he follow-up with PCP with regards to his dizziness.  We have continued him  on aspirin, carvedilol, Lipitor, and Imdur.  Aggressive secondary prevention.  No plans for further ischemic evaluation at this time.  2. Chronic diastolic CHF: He does not appear volume overloaded.   His weight is at his baseline.  For now, he will continue torsemide 40 mg twice daily, though we are checking a BMP today to ensure he is not on the dry side.  I have advised that he should remain off metolazone unless he has a greater than 3 pound weight gain overnight or 5 pound weight gain in a 1 week time span.  He will remain on KCl 20 mEq daily and has been advised should he take a PRN metolazone he will take an additional potassium with that.  3. Dizziness: This has been a long-standing issue for the patient with essentially unrevealing work-up from both cardiology and ENT.  He does report intermittent palpitations associated with his dizziness and indicates he has previously worn outpatient cardiac monitoring through the [facility] without significant arrhythmia being found per his report.  I offered him repeat outpatient cardiac monitoring which is declined at this time.  He should remain adequately hydrated and change positions slowly.  Perhaps   he would benefit from a neurology evaluation.  4. AKI: In the setting of patient directed overdiuresis.  Metolazone has since been discontinued with improvement in renal function.  We will check a BMP today.  5. Lower extremity swelling: I do not think this is necessarily related to heart failure at this time as his weight is back to baseline.  I suspect there is some degree of dependent edema from venous insufficiency and cannot exclude potential lower extremity swelling in the setting of his calcium channel blocker usage.  He prefers to remain on amlodipine at this time.  He does have compression stockings at home and I have instructed him how to place these on each day.  Could consider trial of stopping amlodipine with escalation of alternative antihypertensive therapy to see how his swelling trends.  This has been a chronic issue for him and is not consistent with DVT.  6. Hypertension: Blood pressure is well controlled today.  Patient reports  blood pressure readings at home typically in the upper 140s over 80s.  We did discuss discontinuing his amlodipine as this may be contributing to some of his lower extremity swelling however he prefers to remain on this medication at this time.  We will increase his Imdur to 60 mg daily.  He will otherwise remain on amlodipine 5 mg daily, carvedilol 25 mg twice daily, losartan 100 mg daily, and torsemide 40 mg twice daily.  7. Hyperlipidemia: LDL of 54 from 08/2017.  Remains on Lipitor 40 mg daily.  8. Sleep apnea: The patient has previously been referred to pulmonology for repeat sleep study if indicated.  9. Patient would like to transition from Dr. Kirke CorinArida to Dr. Mariah MillingGollan.  I will send a staff message to each physician indicating the patient's request for this change and will await their response.  10. Back pain: This is likely exacerbating his blood pressure.  He is followed by PCP and orthopedics.  Disposition: F/u with Dr. Mariah MillingGollan or an APP in 3 months.  Current medicines are reviewed at length with the patient today.  The patient did not have any concerns regarding medicines.  Signed, Eula Listenyan Novali Vollman, PA-C 05/18/2018 2:42 PM     [facility] [place] [address] [place], [place] [address] [phone]

## 2018-05-16 NOTE — Telephone Encounter (Signed)
Refilled for now.

## 2018-05-16 NOTE — Telephone Encounter (Signed)
Pt requesting refill of Tramadol and Oxycodone states he was given the medication in ED and is out of medication now.  Requesting it be sent into [facility].  He is scheduled to see Dimple NanasJenni B on Wednesday.

## 2018-05-17 ENCOUNTER — Ambulatory Visit: Admitting: Physician Assistant

## 2018-05-18 ENCOUNTER — Encounter: Payer: Self-pay | Admitting: Physician Assistant

## 2018-05-18 ENCOUNTER — Ambulatory Visit (INDEPENDENT_AMBULATORY_CARE_PROVIDER_SITE_OTHER): Admitting: Physician Assistant

## 2018-05-18 VITALS — BP 120/78 | HR 55 | Temp 98.2°F | Resp 16 | Wt 221.0 lb

## 2018-05-18 VITALS — BP 120/62 | HR 60 | Ht 67.0 in | Wt 221.2 lb

## 2018-05-18 DIAGNOSIS — I25119 Atherosclerotic heart disease of native coronary artery with unspecified angina pectoris: Secondary | ICD-10-CM

## 2018-05-18 DIAGNOSIS — N179 Acute kidney failure, unspecified: Secondary | ICD-10-CM | POA: Diagnosis not present

## 2018-05-18 DIAGNOSIS — E785 Hyperlipidemia, unspecified: Secondary | ICD-10-CM

## 2018-05-18 DIAGNOSIS — M4807 Spinal stenosis, lumbosacral region: Secondary | ICD-10-CM

## 2018-05-18 DIAGNOSIS — I5032 Chronic diastolic (congestive) heart failure: Secondary | ICD-10-CM | POA: Diagnosis not present

## 2018-05-18 DIAGNOSIS — K5903 Drug induced constipation: Secondary | ICD-10-CM | POA: Diagnosis not present

## 2018-05-18 DIAGNOSIS — M545 Low back pain, unspecified: Secondary | ICD-10-CM

## 2018-05-18 DIAGNOSIS — M48061 Spinal stenosis, lumbar region without neurogenic claudication: Secondary | ICD-10-CM | POA: Diagnosis not present

## 2018-05-18 DIAGNOSIS — M7989 Other specified soft tissue disorders: Secondary | ICD-10-CM

## 2018-05-18 DIAGNOSIS — M9983 Other biomechanical lesions of lumbar region: Secondary | ICD-10-CM | POA: Diagnosis not present

## 2018-05-18 DIAGNOSIS — I25118 Atherosclerotic heart disease of native coronary artery with other forms of angina pectoris: Secondary | ICD-10-CM | POA: Diagnosis not present

## 2018-05-18 DIAGNOSIS — I10 Essential (primary) hypertension: Secondary | ICD-10-CM

## 2018-05-18 DIAGNOSIS — R42 Dizziness and giddiness: Secondary | ICD-10-CM

## 2018-05-18 MED ORDER — ISOSORBIDE MONONITRATE ER 60 MG PO TB24: 60.0000 mg | ORAL_TABLET | Freq: Every day | ORAL | 3 refills | Status: DC

## 2018-05-18 MED ORDER — PREDNISONE 10 MG PO TABS: ORAL_TABLET | ORAL | 0 refills | Status: DC

## 2018-05-18 MED ORDER — OXYCODONE HCL 15 MG PO TABS: 15.0000 mg | ORAL_TABLET | Freq: Four times a day (QID) | ORAL | 0 refills | Status: DC | PRN

## 2018-05-18 MED ORDER — TRAMADOL HCL 50 MG PO TABS: 100.0000 mg | ORAL_TABLET | Freq: Four times a day (QID) | ORAL | 0 refills | Status: DC | PRN

## 2018-05-18 NOTE — Progress Notes (Signed)
Subjective:  Patient ID: Jonathon Newman, male    DOB: November 21, 1955  Age: 62 y.o. MRN: [id number]  CC:  Chief Complaint  Patient presents with  . Follow-up    HPI  Jonathon Newman presents for Follow up ER visit  Patient was seen in ER for leg pain on 05/06/18. He was treated for radicular low back pain and chronic left sided low back without sciatica. Treatment for this included nerve block., oxycodone and Lidocaine patch. 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 He reports excellent compliance with treatment. He reports this condition is Unchanged. Still having severe pain, numbness and weakness to the left leg. He is trying to ambulate with a cane.  Using tramadol and oxycodone IR 15mg  every 6 hrs prn.  ------------------------------------------------------------------------------------    Past Medical History:  Diagnosis Date  . Arthritis   . CHF (congestive heart failure) (HCC)   . Coronary artery disease    PCI and Cypher drug-eluting stent placement to the right coronary artery in 2010.  NSTEMI in 2016.  Cardiac catheterization showed patent RCA stent, significant distal left main disease with an FFR ratio of 0.76 and ostial left circumflex stenosis.  Underwent CABG with LIMA to LAD and SVG to OM 2.  . Dyspnea   . GERD (gastroesophageal reflux disease)   . Hypercholesteremia   . Hypertension   . S/P coronary artery bypass graft x 2   . Sleep apnea    uses CPAP    Past Surgical History:  Procedure Laterality Date  . APPENDECTOMY    . BACK SURGERY  11/01/2017   SL 5 and S1  . CARDIAC CATHETERIZATION    . CARPAL TUNNEL RELEASE     left hand  . CHOLECYSTECTOMY    . CHOLECYSTECTOMY    . CORONARY ARTERY BYPASS GRAFT  04/21/2015   CABG x 2 [place] [facility] LIMA to LAD amd SVG to OM2  . CORONARY STENT PLACEMENT  2010   Cordis Cypher Sirolimus-eluting stent 2.50 mm x 26 mm placed to the RCA at  [facility]   . ESOPHAGEAL MANOMETRY N/A 08/04/2017   Procedure: ESOPHAGEAL MANOMETRY (EM);  Surgeon: Toney Reil, MD;  Location: [facility] ENDOSCOPY;  Service: Endoscopy;  Laterality: N/A;  . FRACTURE SURGERY    . KNEE SURGERY    . KNEE SURGERY     right knee   . LAMINECTOMY     "plate in neck J1-B1"  . LEFT HEART CATH AND CORS/GRAFTS ANGIOGRAPHY N/A 05/02/2018   Procedure: CORS/GRAFTS ANGIOGRAPHY;  Surgeon: Iran Ouch, MD;  Location: [facility] INVASIVE CV LAB;  Service: Cardiovascular;  Laterality: N/A;  . RIGHT/LEFT HEART CATH AND CORONARY ANGIOGRAPHY Bilateral 05/02/2018   Procedure: LEFT HEART CATH;  Surgeon: Iran Ouch, MD;  Location: [facility] INVASIVE CV LAB;  Service: Cardiovascular;  Laterality: Bilateral;  . VASECTOMY      Family History  Problem Relation Age of Onset  . Arthritis Mother   . Hyperlipidemia Father   . Hypertension Father   . Heart attack Father 75  . Heart murmur Brother   . Valvular heart disease Brother   . Cataracts Maternal Grandmother   . Glaucoma Maternal Grandmother   . Cancer Maternal Grandfather   . Heart attack Paternal Grandfather   . Hypertension Paternal Grandfather   . Prostate cancer Neg Hx   . Bladder Cancer Neg Hx   . Kidney cancer Neg Hx  Social History   Socioeconomic History  . Marital status: Married    Spouse name: Not on file  . Number of children: Not on file  . Years of education: Not on file  . Highest education level: Not on file  Occupational History  . Not on file  Social Needs  . Financial resource strain: Not on file  . Food insecurity:    Worry: Not on file    Inability: Not on file  . Transportation needs:    Medical: Not on file    Non-medical: Not on file  Tobacco Use  . Smoking status: Former Smoker    Packs/day: 0.25    Years: 2.00    Pack years: 0.50    Types: Cigarettes    Last attempt to quit: 05/26/2005    Years since quitting: 12.9  . Smokeless tobacco: Former  Neurosurgeon    Types: Chew  Substance and Sexual Activity  . Alcohol use: No  . Drug use: No  . Sexual activity: Not on file  Lifestyle  . Physical activity:    Days per week: Not on file    Minutes per session: Not on file  . Stress: Not on file  Relationships  . Social connections:    Talks on phone: Not on file    Gets together: Not on file    Attends religious service: Not on file    Active member of club or organization: Not on file    Attends meetings of clubs or organizations: Not on file    Relationship status: Not on file  . Intimate partner violence:    Fear of current or ex partner: Not on file    Emotionally abused: Not on file    Physically abused: Not on file    Forced sexual activity: Not on file  Other Topics Concern  . Not on file  Social History Narrative  . Not on file    Outpatient Medications Prior to Visit  Medication Sig Dispense Refill  . amLODipine (NORVASC) 5 MG tablet Take 1 tablet (5 mg total) by mouth daily. 90 tablet 1  . aspirin 81 MG tablet Take 81 mg by mouth daily.    . carvedilol (COREG) 25 MG tablet Take 1 tablet (25 mg total) by mouth 2 (two) times daily with a meal. 90 tablet 1  . Icosapent Ethyl (VASCEPA) 1 g CAPS Take 2 capsules (2 g total) by mouth 2 (two) times daily. (Patient taking differently: Take 1 g by mouth 2 (two) times daily. ) 120 capsule 3  . isosorbide mononitrate (IMDUR) 30 MG 24 hr tablet Take 1 tablet (30 mg total) by mouth daily. 30 tablet 6  . lidocaine (LIDODERM) 5 % Place 1 patch onto the skin every 12 (twelve) hours. Remove & Discard patch within 12 hours or as directed by MD 30 patch 0  . losartan (COZAAR) 100 MG tablet Take 1 tablet (100 mg total) by mouth daily. 90 tablet 1  . Multiple Vitamin (MULTIVITAMIN WITH MINERALS) TABS tablet Take 1 tablet by mouth daily.    . naproxen sodium (ALEVE) 220 MG tablet Take 440 mg by mouth 2 (two) times daily as needed (mild pain).    Marland Kitchen oxyCODONE (ROXICODONE) 15 MG immediate release  tablet Take 1 tablet (15 mg total) by mouth every 6 (six) hours as needed for pain. 15 tablet 0  . pantoprazole (PROTONIX) 20 MG tablet Take 2 tablets (40 mg total) by mouth 2 (two) times daily. 180 tablet  1  . potassium chloride (K-DUR) 10 MEQ tablet Take 1 tablet (10 mEq total) by mouth 2 (two) times daily as needed. (Patient taking differently: Take 10 mEq by mouth See admin instructions. Take one every AM. Take two with every dose of metolazone.) 180 tablet 3  . Sennosides (SENNA-EX PO) Take 1 tablet by mouth 2 (two) times daily.     . sildenafil (VIAGRA) 100 MG tablet Take 100 mg by mouth daily as needed for erectile dysfunction.    . torsemide (DEMADEX) 20 MG tablet Take 2 tablets (40 mg total) by mouth 2 (two) times daily. (Patient taking differently: Take 40 mg by mouth 2 (two) times daily with breakfast and lunch. ) 120 tablet 6  . traMADol (ULTRAM) 50 MG tablet Take 2 tablets (100 mg total) by mouth every 12 (twelve) hours as needed for moderate pain. 30 tablet 0  . atorvastatin (LIPITOR) 40 MG tablet Take 1 tablet (40 mg total) by mouth daily. 90 tablet 3   No facility-administered medications prior to visit.     Allergies  Allergen Reactions  . Lisinopril Cough    ROS Review of Systems  Constitutional: Negative.   Respiratory: Negative.   Cardiovascular: Negative.   Gastrointestinal: Positive for constipation.  Musculoskeletal: Positive for arthralgias, back pain, gait problem and myalgias.      Objective:    Physical Exam  Constitutional: He appears well-developed and well-nourished. No distress.  HENT:  Head: Normocephalic and atraumatic.  Neck: Normal range of motion. Neck supple.  Cardiovascular: Normal rate, regular rhythm and normal heart sounds. Exam reveals no gallop and no friction rub.  No murmur heard. Pulmonary/Chest: Effort normal and breath sounds normal. No respiratory distress. He has no wheezes. He has no rales.  Musculoskeletal:       Lumbar back:  He exhibits decreased range of motion, tenderness and bony tenderness.  Neurological: Gait (antalgic) abnormal.  Skin: He is not diaphoretic.  Vitals reviewed.   BP 120/78 (BP Location: Left Arm, Patient Position: Sitting, Cuff Size: Normal)   Pulse (!) 55   Temp 98.2 F (36.8 C) (Oral)   Resp 16   Wt 221 lb (100.2 kg)   SpO2 96%   BMI 34.61 kg/m  Wt Readings from Last 3 Encounters:  05/18/18 221 lb (100.2 kg)  05/06/18 225 lb (102.1 kg)  05/07/18 225 lb (102.1 kg)     Health Maintenance Due  Topic Date Due  . INFLUENZA VACCINE  01/13/2018    There are no preventive care reminders to display for this patient.  Lab Results  Component Value Date   TSH 1.600 02/02/2018   Lab Results  Component Value Date   WBC 8.7 04/25/2018   HGB 14.9 04/25/2018   HCT 43.3 04/25/2018   MCV 86 04/25/2018   PLT 288 04/25/2018   Lab Results  Component Value Date   NA 141 04/29/2018   K 3.5 04/29/2018   CO2 26 04/29/2018   GLUCOSE 176 (H) 04/29/2018   BUN 20 04/29/2018   CREATININE 1.05 04/29/2018   BILITOT 0.3 02/02/2018   ALKPHOS 95 02/02/2018   AST 24 02/02/2018   ALT 51 (H) 02/02/2018   PROT 7.5 02/02/2018   ALBUMIN 4.5 02/02/2018   CALCIUM 9.1 04/29/2018   ANIONGAP 11 04/29/2018   Lab Results  Component Value Date   CHOL 185 02/02/2018   Lab Results  Component Value Date   HDL 35 (L) 02/02/2018   Lab Results  Component Value Date  LDLCALC Comment 02/02/2018   Lab Results  Component Value Date   TRIG 692 (HH) 02/02/2018   Lab Results  Component Value Date   CHOLHDL 5.3 (H) 02/02/2018   No results found for: HGBA1C    Assessment & Plan:   1. Neuroforaminal stenosis of lumbosacral spine Will try 12 day prednisone taper as noted below for inflammation. Continue oxycodone and tramadol prn. Referral placed back to Dr. Iline Oven, had surgery on lumbar spine in May with Dr. Iline Oven.  - predniSONE (DELTASONE) 10 MG tablet; Take 6 tabs PO on day 1&2, 5 tabs PO on  day 3&4, 4 tabs PO on day 5&6, 3 tabs PO on day 7&8, 2 tabs PO on day 9&10, 1 tab PO on day 11&12.  Dispense: 42 tablet; Refill: 0 - Ambulatory referral to Orthopedic Surgery  2. Foraminal stenosis of lumbar region See above medical treatment plan. - oxyCODONE (ROXICODONE) 15 MG immediate release tablet; Take 1 tablet (15 mg total) by mouth every 6 (six) hours as needed for pain.  Dispense: 120 tablet; Refill: 0 - traMADol (ULTRAM) 50 MG tablet; Take 2 tablets (100 mg total) by mouth every 6 (six) hours as needed for moderate pain.  Dispense: 120 tablet; Refill: 0  3. Drug-induced constipation Discussed taking stool softener every time he takes an oxycodone IR and adding miralax prn. May consider movantiq or relistor if symptoms continue or narcotic medications are needed longer term.

## 2018-05-18 NOTE — Patient Instructions (Signed)
Medication Instructions:  - Your physician has recommended you make the following change in your medication:   1) Increase imdur (isosorbide MN) to 60 mg- take 1 tablet by mouth once daily  If you need a refill on your cardiac medications before your next appointment, please call your pharmacy.   Lab work: - Your physician recommends that you have lab work today: BMP  If you have labs (blood work) drawn today and your tests are completely normal, you will receive your results only by: Marland Kitchen. MyChart Message (if you have MyChart) OR . A paper copy in the mail If you have any lab test that is abnormal or we need to change your treatment, we will call you to review the results.  Testing/Procedures: - none ordered  Follow-Up: At [facility], you and your health needs are our priority.  As part of our continuing mission to provide you with exceptional heart care, we have created designated Provider Care Teams.  These Care Teams include your primary Cardiologist (physician) and Advanced Practice Providers (APPs -  Physician Assistants and Nurse Practitioners) who all work together to provide you with the care you need, when you need it. . in 3 months with Dr. Mariah MillingGollan/ APP  Any Other Special Instructions Will Be Listed Below (If Applicable). - N/A

## 2018-05-18 NOTE — Patient Instructions (Signed)
Herniated Disk A herniated disk, also called a ruptured disk or slipped disk, occurs when a disk in the spine bulges out too far. Between the bones in the spine (vertebrae), there are oval disks that are made of a soft, spongy center that is surrounded by a tough outer ring. The disks connect your vertebrae, help your spine move, and absorb shocks from your movement. When you have a herniated disk, the spongy center of the disk bulges out or breaks through the outer ring. It can press on a nerve between the vertebrae and cause pain. This can occur anywhere in the back or neck area, but the lower back is most commonly affected. What are the causes? This condition may be caused by:  Age-related wear and tear. The spongy centers of spinal disks tend to shrink and dry out with age, which makes them more likely to herniate.  Sudden injury, such as a strain or sprain.  What increases the risk? Aging is the main risk factor for a herniated disk. Other risk factors include:  Being a man who is 30-50 years old.  Frequently doing activities that involve heavy lifting, bending, or twisting.  Frequently driving for long hours at a time.  Not getting enough exercise.  Being overweight.  Smoking.  Having a family history of back problems or herniated disks.  Being pregnant or giving birth.  Having poor nutrition.  Being tall.  What are the signs or symptoms? Symptoms may vary depending on where your herniated disk is located.  A herniated disk in the lower back may cause sharp pain in: ? Part of the arm, leg, hip, or buttocks. ? The back of the lower leg (calf). ? The lower back, spreading down through the leg into the foot (sciatica).  A herniated disk in the neck may cause dizziness and vertigo. It may also cause pain or weakness in: ? The neck. ? The shoulder blades. ? Upper arm, forearm, or fingers.  You may also have muscle weakness. It may be difficult to: ? Lift your leg or  arm. ? Stand on your toes. ? Squeeze tightly with one of your hands.  Other symptoms may include: ? Numbness or tingling in the affected areas of the hands, arms, feet, or legs. ? Inability to control when you urinate or when you have bowel movements. This is a rare but serious sign of a severe herniated disk in the lower back.  How is this diagnosed? This condition may be diagnosed based on:  Your symptoms.  Your medical history.  A physical exam. The exam may include: ? Straight-leg test. You will lie on your back while your health care provider lifts your leg, keeping your knee straight. If you feel pain, you likely have a herniated disk. ? Neurological tests. This includes checking for numbness, reflexes, muscle strength, and posture.  Imaging tests, such as: ? X-rays. ? MRI. ? CT scan. ? Electromyogram (EMG) to check the nerves that control muscles. This test may be used to determine which nerves are affected by your herniated disk.  How is this treated? Treatment for this condition may include:  A short period of rest. This is usually the first treatment. ? You may be on bed rest for up to 2 days, or you may be instructed to stay home and avoid physical activity. ? If you have a herniated disk in your lower back, avoid sitting as much as possible. Sitting increases pressure on the disk.  Medicines. These may   include: ? NSAIDs to help reduce pain and swelling. ? Muscle relaxants to prevent sudden tightening of the back muscles (back spasms). ? Prescription pain medicines, if you have severe pain.  Steroid injections in the area of the herniated disk. This can help reduce pain and swelling.  Physical therapy to strengthen your back muscles.  In many cases, symptoms go away with treatment over a period of days or weeks. You will most likely be free of symptoms after 3-4 months. If other treatments do not help to relieve your symptoms, you may need surgery. Follow these  instructions at home: Medicines  Take over-the-counter and prescription medicines only as told by your health care provider.  Do not drive or use heavy machinery while taking prescription pain medicine. Activity  Rest as directed.  After your rest period: ? Return to your normal activities and gradually begin exercising as told by your health care provider. Ask your health care provider what activities and exercises are safe for you. ? Use good posture. ? Avoid movements that cause pain. ? Do not lift anything that is heavier than 10 lb (4.5 kg) until your health care provider says this is safe. ? Do not sit or stand for long periods of time without changing positions. ? Do not sit for long periods of time without getting up and moving around.  If physical therapy was prescribed, do exercises as instructed.  Aim to strengthen muscles in your back and abdomen with exercises like crunches, swimming, or walking. General instructions  Do not use any products that contain nicotine or tobacco, such as cigarettes and e-cigarettes. These products can delay healing. If you need help quitting, ask your health care provider.  Do not wear high-heeled shoes.  Do not sleep on your belly.  If you are overweight, work with your health care provider to lose weight safely.  To prevent or treat constipation while you are taking prescription pain medicine, your health care provider may recommend that you: ? Drink enough fluid to keep your urine clear or pale yellow. ? Take over-the-counter or prescription medicines. ? Eat foods that are high in fiber, such as fresh fruits and vegetables, whole grains, and beans. ? Limit foods that are high in fat and processed sugars, such as fried and sweet foods.  Keep all follow-up visits as told by your health care provider. This is important. How is this prevented?  Maintain a healthy weight.  Try to avoid stressful situations.  Maintain physical  fitness. Do at least 150 minutes of moderate-intensity exercise each week, such as brisk walking or water aerobics.  When lifting objects: ? Keep your feet at least shoulder-width apart and tighten your abdominal muscles. ? Keep your spine neutral as you bend your knees and hips. It is important to lift using the strength of your legs, not your back. Do not lock your knees straight out. ? Always ask for help to lift heavy or awkward objects. Contact a health care provider if:  You have back pain or neck pain that does not get better after 6 weeks.  You have severe pain in your back, neck, legs, or arms.  You develop numbness, tingling, or weakness in any part of your body. Get help right away if:  You cannot move your arms or legs.  You cannot control when you urinate or have bowel movements.  You feel dizzy or you faint.  You have shortness of breath. This information is not intended to replace advice given   to you by your health care provider. Make sure you discuss any questions you have with your health care provider. Document Released: 05/29/2000 Document Revised: 01/27/2016 Document Reviewed: 01/27/2016 Elsevier Interactive Patient Education  2017 [employer]  

## 2018-05-19 ENCOUNTER — Telehealth: Payer: Self-pay | Admitting: Cardiovascular Disease

## 2018-05-19 LAB — BASIC METABOLIC PANEL
BUN/Creatinine Ratio: 17 (ref 10–24)
BUN: 19 mg/dL (ref 8–27)
CALCIUM: 9.5 mg/dL (ref 8.6–10.2)
CHLORIDE: 100 mmol/L (ref 96–106)
CO2: 20 mmol/L (ref 20–29)
Creatinine, Ser: 1.12 mg/dL (ref 0.76–1.27)
GFR calc Af Amer: 82 mL/min/{1.73_m2} (ref >59)
GFR calc non Af Amer: 71 mL/min/{1.73_m2} (ref >59)
GLUCOSE: 162 mg/dL — AB (ref 65–99)
POTASSIUM: 3.8 mmol/L (ref 3.5–5.2)
Sodium: 137 mmol/L (ref 134–144)

## 2018-05-19 NOTE — Telephone Encounter (Signed)
3 m fu per checkout 05/18/18

## 2018-05-24 ENCOUNTER — Telehealth: Payer: Self-pay | Admitting: Physician Assistant

## 2018-05-24 ENCOUNTER — Telehealth: Payer: Self-pay | Admitting: Cardiovascular Disease

## 2018-05-24 NOTE — Telephone Encounter (Signed)
Patient calling  States that he will be needing clearance, have not received official clearance form     [facility] Pre-operative Risk Assessment    Request for surgical clearance:  1. What type of surgery is being performed? Back surgery    2. When is this surgery scheduled?  12/16  3. What type of clearance is required (medical clearance vs. Pharmacy clearance to hold med vs. Both)? Medical   4. Are there any medications that need to be held prior to surgery and how long?   5. Practice name and name of physician performing surgery? [facility] [place]  6. What is your office phone number [phone] ext [phone]    7.   What is your office fax number  8.   Anesthesia type (None, local, MAC, general) ?    Caryl Pina Gerringer 05/24/2018, 1:33 PM  _________________________________________________________________   (provider comments below)

## 2018-05-24 NOTE — Telephone Encounter (Signed)
   [facility] Pre-operative Risk Assessment    Request for surgical clearance:  1. What type of surgery is being performed?  Lumbar Laminectomy  2. When is this surgery scheduled? 05/30/2018  What type of clearance is required (medical clearance vs. Pharmacy clearance to hold med vs. Both)? Not listed  3. Are there any medications that need to be held prior to surgery and how long? Not listed  Practice name and name of physician performing surgery? [facility] Dr. Joselyn Arrow 4. What is your office phone number [phone]   7.   What is your office fax number [phone]  8.   Anesthesia type (None, local, MAC, general) ? Not listed   Jonathon Newman 05/24/2018, 3:28 PM  _________________________________________________________________   (provider comments below)

## 2018-05-24 NOTE — Telephone Encounter (Signed)
Patient's recent cardiac catheterization in 04/2018 demonstrated patent bypass grafts and patent RCA stent.  His symptoms did not appear to be ischemic in etiology.  Per modified Nedra HaiLee criteria he is at least moderate risk for noncardiac surgery with a 6.6% estimated rate of myocardial infarction, pulmonary edema, ventricular fibrillation, cardiac arrest, or complete heart block.  No further cardiac testing will reduce this risk any further at this time.

## 2018-05-24 NOTE — Telephone Encounter (Signed)
Sondra Bargesunn, [name], PA-C 30 minutes ago (3:48 PM)      Patient's recent cardiac catheterization in 04/2018 demonstrated patent bypass grafts and patent RCA stent. His symptoms did not appear to be ischemic in etiology.  Per modified Nedra HaiLee criteria he is at least moderate risk for noncardiac surgery with a 6.6% estimated rate of myocardial infarction, pulmonary edema, ventricular fibrillation, cardiac arrest, or complete heart block.  No further cardiac testing will reduce this risk any further at this time.

## 2018-05-24 NOTE — Telephone Encounter (Signed)
Routed to number provided via EPIC fax.  

## 2018-05-24 NOTE — Telephone Encounter (Signed)
Routed to number provided via EPIC fax from previous encounter.

## 2018-05-26 ENCOUNTER — Other Ambulatory Visit: Payer: Self-pay | Admitting: Physician Assistant

## 2018-05-26 DIAGNOSIS — I10 Essential (primary) hypertension: Secondary | ICD-10-CM

## 2018-05-26 DIAGNOSIS — I25118 Atherosclerotic heart disease of native coronary artery with other forms of angina pectoris: Secondary | ICD-10-CM

## 2018-07-17 ENCOUNTER — Other Ambulatory Visit: Payer: Self-pay | Admitting: Physician Assistant

## 2018-07-17 DIAGNOSIS — K219 Gastro-esophageal reflux disease without esophagitis: Secondary | ICD-10-CM

## 2018-07-19 ENCOUNTER — Ambulatory Visit (INDEPENDENT_AMBULATORY_CARE_PROVIDER_SITE_OTHER): Admitting: Internal Medicine

## 2018-07-19 ENCOUNTER — Encounter: Payer: Self-pay | Admitting: Internal Medicine

## 2018-07-19 VITALS — BP 90/60 | HR 77 | Resp 16 | Ht 67.0 in | Wt 224.0 lb

## 2018-07-19 DIAGNOSIS — G4719 Other hypersomnia: Secondary | ICD-10-CM

## 2018-07-19 NOTE — Progress Notes (Signed)
[facility] Consultation      Assessment and Plan:  Excessive daytime sleepiness with with history of obstructive sleep apnea. - Suspect that the patient's CPAP is malfunctioning. -Discussed that in order to get a new CPAP for him we will need to perform a new sleep study.   Essential hypertension. - Obstructive sleep apnea can contribute to above condition, therefore treatment of sleep apnea is important part of his management.  Orders Placed This Encounter  Procedures  . Home sleep test   Return in about 3 months (around 10/17/2018).   Date: 07/19/2018  MRN# [id number] Jonathon Newman 12/15/1955   Jonathon Newman is a 63 y.o. old male seen in consultation for chief complaint of:    Chief Complaint  Patient presents with  . Consult    Referred by Eula Listenyan Dunn for eval of OSA:  . Sleep Apnea    pt has been on cpap 10-12 years. He feels like he is not getting enough pressure.   . Snoring  . excessive daytime sleepiness    HPI:   The patient is a 63 year old male with a history of obstructive sleep apnea.  He usually goes to bed between 9 and 10 PM, wakes up a few times, gets out of bed between 5:30 AM and 6 AM.  He previously had a sleep study about 10 years ago and was put on CPAP.  Today he has significant daytime sleepiness, Epworth scale is elevated at 11. He is currently using a CPAP every night, however he notes that he feels that he is suffocating and that he is not getting enough air. His machine is at least 62 years old, and in the past 6 months he has noted new issues. He is more sleepy during the day. His weight has increased about 30 lbs in past 2 years. He was previously taking tramadol and oxycodone for back pain but he has been off of them for about 2 weeks.   He denies jaw pain, denies sleep paralysis, denies cataplexy, no TMJ.     PMHX:   Past Medical History:  Diagnosis Date  . Arthritis   . CHF (congestive heart failure) (HCC)   .  Coronary artery disease    PCI and Cypher drug-eluting stent placement to the right coronary artery in 2010.  NSTEMI in 2016.  Cardiac catheterization showed patent RCA stent, significant distal left main disease with an FFR ratio of 0.76 and ostial left circumflex stenosis.  Underwent CABG with LIMA to LAD and SVG to OM 2.  . Dyspnea   . GERD (gastroesophageal reflux disease)   . Hypercholesteremia   . Hypertension   . S/P coronary artery bypass graft x 2   . Sleep apnea    uses CPAP   Surgical Hx:  Past Surgical History:  Procedure Laterality Date  . APPENDECTOMY    . BACK SURGERY  11/01/2017   SL 5 and S1  . CARDIAC CATHETERIZATION    . CARPAL TUNNEL RELEASE     left hand  . CHOLECYSTECTOMY    . CHOLECYSTECTOMY    . CORONARY ARTERY BYPASS GRAFT  04/21/2015   CABG x 2 [place] [facility] LIMA to LAD amd SVG to OM2  . CORONARY STENT PLACEMENT  2010   Cordis Cypher Sirolimus-eluting stent 2.50 mm x 26 mm [facility]   . ESOPHAGEAL MANOMETRY N/A 08/04/2017   Procedure: ESOPHAGEAL MANOMETRY (EM);  Surgeon: Toney ReilVanga, Rohini Reddy, MD;  Location:  [facility] ENDOSCOPY;  Service: Endoscopy;  Laterality: N/A;  . FRACTURE SURGERY    . KNEE SURGERY    . KNEE SURGERY     right knee   . LAMINECTOMY     "plate in neck U9-W1C3-C4"  . LEFT HEART CATH AND CORS/GRAFTS ANGIOGRAPHY N/A 05/02/2018   Procedure: CORS/GRAFTS ANGIOGRAPHY;  Surgeon: Iran OuchArida, Muhammad A, MD;  Location: [facility] INVASIVE CV LAB;  Service: Cardiovascular;  Laterality: N/A;  . RIGHT/LEFT HEART CATH AND CORONARY ANGIOGRAPHY Bilateral 05/02/2018   Procedure: LEFT HEART CATH;  Surgeon: Iran OuchArida, Muhammad A, MD;  Location: [facility] INVASIVE CV LAB;  Service: Cardiovascular;  Laterality: Bilateral;  . VASECTOMY     Family Hx:  Family History  Problem Relation Age of Onset  . Arthritis Mother   . Hyperlipidemia Father   . Hypertension Father   . Heart attack Father [id number]  . Heart murmur Brother   . Valvular heart  disease Brother   . Cataracts Maternal Grandmother   . Glaucoma Maternal Grandmother   . Cancer Maternal Grandfather   . Heart attack Paternal Grandfather   . Hypertension Paternal Grandfather   . Prostate cancer Neg Hx   . Bladder Cancer Neg Hx   . Kidney cancer Neg Hx    Social Hx:   Social History   Tobacco Use  . Smoking status: Former Smoker    Packs/day: 0.25    Years: 2.00    Pack years: 0.50    Types: Cigarettes    Last attempt to quit: 05/26/2005    Years since quitting: 13.1  . Smokeless tobacco: Former NeurosurgeonUser    Types: Chew  Substance Use Topics  . Alcohol use: No  . Drug use: No   Medication:    Current Outpatient Medications:  .  amLODipine (NORVASC) 5 MG tablet, Take 1 tablet (5 mg total) by mouth daily., Disp: 90 tablet, Rfl: 1 .  aspirin 81 MG tablet, Take 81 mg by mouth daily., Disp: , Rfl:  .  carvedilol (COREG) 25 MG tablet, TAKE ONE TABLET BY MOUTH TWICE A DAY WITH A MEAL, Disp: 180 tablet, Rfl: 1 .  isosorbide mononitrate (IMDUR) 60 MG 24 hr tablet, Take 1 tablet (60 mg total) by mouth daily., Disp: 90 tablet, Rfl: 3 .  losartan (COZAAR) 100 MG tablet, Take 1 tablet (100 mg total) by mouth daily., Disp: 90 tablet, Rfl: 1 .  Multiple Vitamin (MULTIVITAMIN WITH MINERALS) TABS tablet, Take 1 tablet by mouth daily., Disp: , Rfl:  .  naproxen sodium (ALEVE) 220 MG tablet, Take 440 mg by mouth 2 (two) times daily as needed (mild pain)., Disp: , Rfl:  .  oxyCODONE (ROXICODONE) 15 MG immediate release tablet, Take 1 tablet (15 mg total) by mouth every 6 (six) hours as needed for pain., Disp: 120 tablet, Rfl: 0 .  pantoprazole (PROTONIX) 20 MG tablet, TAKE TWO TABLETS BY MOUTH TWICE A DAY, Disp: 180 tablet, Rfl: 1 .  potassium chloride (K-DUR) 10 MEQ tablet, Take 1 tablet (10 mEq total) by mouth 2 (two) times daily as needed. (Patient taking differently: Take 10 mEq by mouth See admin instructions. Take one every AM. Take two with every dose of metolazone.), Disp: 180  tablet, Rfl: 3 .  Sennosides (SENNA-EX PO), Take 1 tablet by mouth 2 (two) times daily. , Disp: , Rfl:  .  sildenafil (VIAGRA) 100 MG tablet, Take 100 mg by mouth daily as needed for erectile dysfunction., Disp: , Rfl:  .  torsemide (DEMADEX) 20 MG tablet,  Take 2 tablets (40 mg total) by mouth 2 (two) times daily. (Patient taking differently: Take 40 mg by mouth 2 (two) times daily with breakfast and lunch. ), Disp: 120 tablet, Rfl: 6 .  traMADol (ULTRAM) 50 MG tablet, Take 2 tablets (100 mg total) by mouth every 6 (six) hours as needed for moderate pain., Disp: 120 tablet, Rfl: 0 .  atorvastatin (LIPITOR) 40 MG tablet, Take 1 tablet (40 mg total) by mouth daily., Disp: 90 tablet, Rfl: 3   Allergies:  Lisinopril  Review of Systems: Gen:  Denies  fever, sweats, chills HEENT: Denies blurred vision, double vision. bleeds, sore throat Cvc:  No dizziness, chest pain. Resp:   Denies cough or sputum production, shortness of breath Gi: Denies swallowing difficulty, stomach pain. Gu:  Denies bladder incontinence, burning urine Ext:   No Joint pain, stiffness. Skin: No skin rash,  hives  Endoc:  No polyuria, polydipsia. Psych: No depression, insomnia. Other:  All other systems were reviewed with the patient and were negative other that what is mentioned in the HPI.   Physical Examination:   VS: BP 90/60 (BP Location: Left Arm, Cuff Size: Large)   Pulse 77   Resp 16   Ht 5\' 7"  (1.702 m)   Wt 224 lb (101.6 kg)   SpO2 94%   BMI 35.08 kg/m   General Appearance: No distress  Neuro:without focal findings,  speech normal,  HEENT: PERRLA, EOM intact.   Pulmonary: normal breath sounds, No wheezing.  CardiovascularNormal S1,S2.  No m/r/g.   Abdomen: Benign, Soft, non-tender. Renal:  No costovertebral tenderness  GU:  No performed at this time. Endoc: No evident thyromegaly, no signs of acromegaly. Skin:   warm, no rashes, no ecchymosis  Extremities: normal, no cyanosis, clubbing.  Other  findings:    LABORATORY PANEL:   CBC No results for input(s): WBC, HGB, HCT, PLT in the last 168 hours. ------------------------------------------------------------------------------------------------------------------  Chemistries  No results for input(s): NA, K, CL, CO2, GLUCOSE, BUN, CREATININE, CALCIUM, MG, AST, ALT, ALKPHOS, BILITOT in the last 168 hours.  Invalid input(s): GFRCGP ------------------------------------------------------------------------------------------------------------------  Cardiac Enzymes No results for input(s): TROPONINI in the last 168 hours. ------------------------------------------------------------  RADIOLOGY:  No results found.     Thank  you for the consultation and for allowing [facility] to assist in the care of your patient. Our recommendations are noted above.  Please contact us if we can be of further service.   Wells Guiles, M.D., F.C.C.P.  Board Certified in Internal Medicine, Pulmonary Medicine, Critical Care Medicine, and Sleep Medicine.  [place] [facility] Office Number: [phone]   07/19/2018

## 2018-07-19 NOTE — Patient Instructions (Signed)

## 2018-08-07 ENCOUNTER — Other Ambulatory Visit: Payer: Self-pay | Admitting: Cardiovascular Disease

## 2018-08-08 ENCOUNTER — Telehealth: Payer: Self-pay | Admitting: Cardiovascular Disease

## 2018-08-08 NOTE — Telephone Encounter (Signed)
LMOV to schedule follow up 

## 2018-08-08 NOTE — Telephone Encounter (Signed)
-----   Message from Margrett Rud, [place] sent at 08/08/2018 10:30 AM EST ----- Regarding: Appointment for refills Can you try to contact patient for an appointment.   Thank you!

## 2018-08-24 ENCOUNTER — Other Ambulatory Visit: Payer: Self-pay | Admitting: Physician Assistant

## 2018-08-24 DIAGNOSIS — I10 Essential (primary) hypertension: Secondary | ICD-10-CM

## 2018-08-24 DIAGNOSIS — I25118 Atherosclerotic heart disease of native coronary artery with other forms of angina pectoris: Secondary | ICD-10-CM

## 2018-08-25 NOTE — Telephone Encounter (Signed)
No ans no vm   °

## 2018-08-28 ENCOUNTER — Other Ambulatory Visit: Payer: Self-pay | Admitting: Cardiovascular Disease

## 2018-08-30 DIAGNOSIS — G4733 Obstructive sleep apnea (adult) (pediatric): Secondary | ICD-10-CM | POA: Diagnosis not present

## 2018-08-30 NOTE — Progress Notes (Signed)
09/01/2018 10:07 AM   Jonathon Newman January 25, 1956 [id number]  Referring provider: Margaretann Loveless, PA-C [address] [place], [place] [address]  Chief Complaint  Patient presents with  . Follow-up    HPI: Jonathon Newman is a 63 y.o. male Caucasian with BPH with LUTS who presents today for a 1 month follow up.  Patient initially presented at clinic on 07/21/2017 for BPH with lower urinary tract symptoms, and was seen by Dr. Lonna Cobb.  On that visit he presented with a 2-year history of voiding difficulties, including decreased force and caliber of his urinary stream, hesitancy, postvoid dribbling, straining to urinate, urgency and nocturia x 4-5.  He stated that his symptoms had started after bypass surgery approximately two years prior.  He did not think he had an indwelling Foley catheter long during that hospitalization.  He was on tamsulosin 0.4 mg approximately 2 years, and was increased to 0.8 mg 1 year ago.  His symptoms have slowly worsened, but have been stable for the past 12 months.  He denied dysuria, gross hematuria, flank/abdominal/pelvic/scrotal pain, and prior urological evaluation.  On 08/17/2017, patient had a cystoscopy which found: Enlarged prostate with lateral lobe enlargement and a prostatic urethral length estimated at 3 cm.  No median lobe.  Mild bladder neck elevation.  Patient was interested in UroLift at that time.  Patient's PVR on that visit was approximately 350 mL, and he wished to start at least daily intermittent catheterization.  Today (09/01/2018), the patient's PVR is 15 mL.  Patient says he's had a little bit of pain, but it is likely residual from previous conditions.  Patient reports that he is status post UroLift in 05/2018.  Patient has not needed to catheterize anymore.  PMH: Past Medical History:  Diagnosis Date  . Arthritis   . CHF (congestive heart failure) (HCC)   . Coronary artery disease    PCI and Cypher drug-eluting stent  placement to the right coronary artery in 2010.  NSTEMI in 2016.  Cardiac catheterization showed patent RCA stent, significant distal left main disease with an FFR ratio of 0.76 and ostial left circumflex stenosis.  Underwent CABG with LIMA to LAD and SVG to OM 2.  . Dyspnea   . GERD (gastroesophageal reflux disease)   . Hypercholesteremia   . Hypertension   . S/P coronary artery bypass graft x 2   . Sleep apnea    uses CPAP    Surgical History: Past Surgical History:  Procedure Laterality Date  . APPENDECTOMY    . BACK SURGERY  11/01/2017   SL 5 and S1  . CARDIAC CATHETERIZATION    . CARPAL TUNNEL RELEASE     left hand  . CHOLECYSTECTOMY    . CHOLECYSTECTOMY    . CORONARY ARTERY BYPASS GRAFT  04/21/2015   CABG x 2 [place] V.A. LIMA to LAD amd SVG to OM2  . CORONARY STENT PLACEMENT  2010   Cordis Cypher Sirolimus-eluting stent 2.50 mm x 26 mm placed to the RCA at [facility]   . ESOPHAGEAL MANOMETRY N/A 08/04/2017   Procedure: ESOPHAGEAL MANOMETRY (EM);  Surgeon: Toney Reil, MD;  Location: [facility] ENDOSCOPY;  Service: Endoscopy;  Laterality: N/A;  . FRACTURE SURGERY    . KNEE SURGERY    . KNEE SURGERY     right knee   . LAMINECTOMY     "plate in neck Q6-S3"  . LEFT HEART CATH AND CORS/GRAFTS ANGIOGRAPHY N/A 05/02/2018   Procedure: CORS/GRAFTS ANGIOGRAPHY;  Surgeon: Iran OuchArida, Muhammad A, MD;  Location: [facility] INVASIVE CV LAB;  Service: Cardiovascular;  Laterality: N/A;  . RIGHT/LEFT HEART CATH AND CORONARY ANGIOGRAPHY Bilateral 05/02/2018   Procedure: LEFT HEART CATH;  Surgeon: Iran OuchArida, Muhammad A, MD;  Location: [facility] INVASIVE CV LAB;  Service: Cardiovascular;  Laterality: Bilateral;  . VASECTOMY      Home Medications:  Allergies as of 09/01/2018      Reactions   Lisinopril Cough      Medication List       Accurate as of September 01, 2018 10:07 AM. Always use your most recent med list.        amLODipine 5 MG tablet Commonly known as:  NORVASC Take 1  tablet (5 mg total) by mouth daily.   aspirin 81 MG tablet Take 81 mg by mouth daily.   atorvastatin 40 MG tablet Commonly known as:  LIPITOR TAKE 1 TABLET BY MOUTH EVERY DAY   carvedilol 25 MG tablet Commonly known as:  COREG TAKE ONE TABLET BY MOUTH TWICE A DAY WITH A MEAL   isosorbide mononitrate 60 MG 24 hr tablet Commonly known as:  IMDUR Take 1 tablet (60 mg total) by mouth daily.   losartan 100 MG tablet Commonly known as:  COZAAR TAKE ONE TABLET BY MOUTH DAILY   multivitamin with minerals Tabs tablet Take 1 tablet by mouth daily.   naproxen sodium 220 MG tablet Commonly known as:  ALEVE Take 440 mg by mouth 2 (two) times daily as needed (mild pain).   pantoprazole 20 MG tablet Commonly known as:  PROTONIX TAKE TWO TABLETS BY MOUTH TWICE A DAY   potassium chloride 10 MEQ tablet Commonly known as:  K-DUR Take 1 tablet (10 mEq total) by mouth 2 (two) times daily as needed.   SENNA-EX PO Take 1 tablet by mouth 2 (two) times daily.   sildenafil 100 MG tablet Commonly known as:  VIAGRA Take 100 mg by mouth daily as needed for erectile dysfunction.   torsemide 20 MG tablet Commonly known as:  DEMADEX Take 2 tablets (40 mg total) by mouth 2 (two) times daily.       Allergies:  Allergies  Allergen Reactions  . Lisinopril Cough    Family History: Family History  Problem Relation Age of Onset  . Arthritis Mother   . Hyperlipidemia Father   . Hypertension Father   . Heart attack Father [id number]  . Heart murmur Brother   . Valvular heart disease Brother   . Cataracts Maternal Grandmother   . Glaucoma Maternal Grandmother   . Cancer Maternal Grandfather   . Heart attack Paternal Grandfather   . Hypertension Paternal Grandfather   . Prostate cancer Neg Hx   . Bladder Cancer Neg Hx   . Kidney cancer Neg Hx     Social History:  reports that he quit smoking about 13 years ago. His smoking use included cigarettes. He has a 0.50 pack-year smoking history. He  has quit using smokeless tobacco.  His smokeless tobacco use included chew. He reports that he does not drink alcohol or use drugs.  ROS: UROLOGY Frequent Urination?: Yes Hard to postpone urination?: Yes Burning/pain with urination?: No Get up at night to urinate?: Yes Leakage of urine?: No Urine stream starts and stops?: No Trouble starting stream?: No Do you have to strain to urinate?: No Blood in urine?: No Urinary tract infection?: No Sexually transmitted disease?: No Injury to kidneys or bladder?: No Painful intercourse?: No Weak stream?: No Erection problems?:  Yes Penile pain?: No  Gastrointestinal Nausea?: No Vomiting?: No Indigestion/heartburn?: No Diarrhea?: No Constipation?: No  Constitutional Fever: No Night sweats?: No Weight loss?: No Fatigue?: No  Skin Skin rash/lesions?: No Itching?: No  Eyes Blurred vision?: No Double vision?: No  Ears/Nose/Throat Sore throat?: No Sinus problems?: No  Hematologic/Lymphatic Swollen glands?: No Easy bruising?: No  Cardiovascular Leg swelling?: No Chest pain?: No  Respiratory Cough?: No Shortness of breath?: No  Endocrine Excessive thirst?: No  Musculoskeletal Back pain?: No Joint pain?: No  Neurological Headaches?: No Dizziness?: No  Psychologic Depression?: No Anxiety?: No  Physical Exam: BP 122/71   Pulse 67   Ht 5\' 7"  (1.702 m)   Wt 231 lb (104.8 kg)   BMI 36.18 kg/m   Constitutional:  Well nourished. Alert and oriented, No acute distress. Cardiovascular: No clubbing, cyanosis, or edema. Respiratory: Normal respiratory effort, no increased work of breathing. Skin: No rashes, bruises or suspicious lesions. Neurologic: Grossly intact, no focal deficits, moving all 4 extremities. Psychiatric: Normal mood and affect.  Laboratory Data: Lab Results  Component Value Date   WBC 8.7 04/25/2018   HGB 14.9 04/25/2018   HCT 43.3 04/25/2018   MCV 86 04/25/2018   PLT 288 04/25/2018     Lab Results  Component Value Date   CREATININE 1.12 05/18/2018   I have reviewed the labs.  Assessment & Plan:    1. BPH with LUTS and incomplete bladder emptying - Patient is not complaining of symptoms at this time - Patient is actually a patient of [facility] in [place] and did not need to be seen today and this was likely an appointment that was to be a one month follow up vs a one year follow up - no charges for today's appointment and [facility] will remove the bladder scan charges   Return if symptoms worsen or fail to improve.  Michiel Cowboy, PA-C  [facility] [address], [address] [place], [place] [address] [phone]  I, Duanne Moron, am acting as a scribe for [facility], PA-C   I have reviewed the above documentation for accuracy and completeness, and I agree with the above.    Michiel Cowboy, PA-C

## 2018-08-31 ENCOUNTER — Telehealth: Payer: Self-pay | Admitting: Internal Medicine

## 2018-08-31 DIAGNOSIS — G4733 Obstructive sleep apnea (adult) (pediatric): Secondary | ICD-10-CM

## 2018-08-31 DIAGNOSIS — G4719 Other hypersomnia: Secondary | ICD-10-CM

## 2018-08-31 NOTE — Telephone Encounter (Signed)
Pt is aware of results and voiced his understanding.  Pt wished to proceed with cpap.  Order has been placed. Pt has been scheduled for ROV on 11/02/18. Nothing further is needed.

## 2018-08-31 NOTE — Telephone Encounter (Signed)
Home sleep study performed on 08/29/18 confirmed severe with AHI of 44.  Recommend auto cpap 5-20cm h2O.  Lm for pt to relay results.

## 2018-09-01 ENCOUNTER — Encounter: Payer: Self-pay | Admitting: Urology

## 2018-09-01 ENCOUNTER — Other Ambulatory Visit: Payer: Self-pay

## 2018-09-01 ENCOUNTER — Ambulatory Visit (INDEPENDENT_AMBULATORY_CARE_PROVIDER_SITE_OTHER): Admitting: Urology

## 2018-09-01 VITALS — BP 122/71 | HR 67 | Ht 67.0 in | Wt 231.0 lb

## 2018-09-01 DIAGNOSIS — R339 Retention of urine, unspecified: Secondary | ICD-10-CM

## 2018-09-01 LAB — BLADDER SCAN AMB NON-IMAGING

## 2018-09-08 ENCOUNTER — Other Ambulatory Visit: Payer: Self-pay | Admitting: Physician Assistant

## 2018-09-08 DIAGNOSIS — I10 Essential (primary) hypertension: Secondary | ICD-10-CM

## 2018-09-10 DIAGNOSIS — M25561 Pain in right knee: Secondary | ICD-10-CM | POA: Insufficient documentation

## 2018-09-15 NOTE — Telephone Encounter (Signed)
LMOV to schedule possible evisit  °

## 2018-10-05 ENCOUNTER — Telehealth: Payer: Self-pay | Admitting: Internal Medicine

## 2018-10-05 DIAGNOSIS — G4733 Obstructive sleep apnea (adult) (pediatric): Secondary | ICD-10-CM

## 2018-10-05 NOTE — Telephone Encounter (Addendum)
Received message from [employer] that patient feels pressure is too low. Attempted to call patient, LM on VM that we will get compliance to Dr. Nicholos Johns and contact him back about change in pressure.   Compliance printed out and placed in DR box.

## 2018-10-07 NOTE — Telephone Encounter (Signed)
Download shows OSA is well controlled. Will change pressure range to 8-20 for pt comfort.

## 2018-10-07 NOTE — Addendum Note (Signed)
Addended by: Erlinda Hong on: 10/07/2018 02:25 PM   Modules accepted: Orders

## 2018-10-07 NOTE — Telephone Encounter (Signed)
LM for patient to call for Dr. Laurence Aly recommendations.

## 2018-10-07 NOTE — Telephone Encounter (Signed)
Spoke to patient, he is aware and thankful for pressure change. Orders entered. Notified him if he doesn't notice a difference or still having problems in 2 weeks to let us know. Patient understands.

## 2018-10-07 NOTE — Addendum Note (Signed)
Addended by: Erlinda Hong on: 10/07/2018 02:52 PM   Modules accepted: Orders

## 2018-10-15 ENCOUNTER — Other Ambulatory Visit: Payer: Self-pay | Admitting: Physician Assistant

## 2018-10-15 DIAGNOSIS — K219 Gastro-esophageal reflux disease without esophagitis: Secondary | ICD-10-CM

## 2018-11-01 ENCOUNTER — Telehealth: Payer: Self-pay | Admitting: Internal Medicine

## 2018-11-01 NOTE — Telephone Encounter (Signed)
Called patient for COVID-19 pre-screening for in office visit.  Have you recently traveled any where out of the local area in the last 2 weeks? NO  Have you been in close contact with a person diagnosed with COVID-19 within the last 2 weeks? NO  Do you currently have any of the following symptoms? If so, when did they start? Cough     Diarrhea   Joint Pain Fever      Muscle Pain   Red eyes Shortness of breath   Abdominal pain  Vomiting Loss of smell    Rash    Sore Throat Headache    Weakness   Bruising or bleeding   Okay to proceed with visit 11/02/2018      

## 2018-11-02 ENCOUNTER — Encounter: Payer: Self-pay | Admitting: Internal Medicine

## 2018-11-02 ENCOUNTER — Other Ambulatory Visit: Payer: Self-pay

## 2018-11-02 ENCOUNTER — Ambulatory Visit (INDEPENDENT_AMBULATORY_CARE_PROVIDER_SITE_OTHER): Admitting: Internal Medicine

## 2018-11-02 VITALS — BP 138/78 | HR 69 | Temp 98.5°F | Ht 67.0 in | Wt 231.8 lb

## 2018-11-02 DIAGNOSIS — G4733 Obstructive sleep apnea (adult) (pediatric): Secondary | ICD-10-CM

## 2018-11-02 NOTE — Progress Notes (Signed)
[facility]      Assessment and Plan:  Severe obstructive sleep apnea. - HST 08/29/2018; AHI 44, recommended CPAP auto with pressure range 8-20.   Essential hypertension. - Obstructive sleep apnea can contribute to above condition, therefore treatment of sleep apnea is important part of his management.   Return in about 1 year (around 11/02/2019).   Date: 11/02/2018  MRN# [id number] Jonathon HerDavid Darnold 03/27/1956   Jonathon Newman is a 63 y.o. old male seen in consultation for chief complaint of:    Chief Complaint  Patient presents with  . Follow-up    wearing cpap avg 7hr nightly- feels pressure & mask are okay.     HPI:   The patient is a 63 year old male with a history of obstructive sleep apnea.  He has been doing pretty well, we increased pressure range to 8-20 and he feels that the pressure is tolerated better. He is cleaning his supplies once daily.   He denies jaw pain, denies sleep paralysis, denies cataplexy, no TMJ.   **CPAP download 10/02/2018-10/31/2018>> raw data personally reviewed.  Average usage on days used is 7 hours 39 minutes.  Usage greater than 4 hours is 28/30 days.  Pressure range 8-20.  Median pressure 9, 95th percentile pressure 11, maximum pressure 12.  Leaks are within normal limits.  Residual AHI 0.4.  Overall this shows very good compliance with excellent control of obstructive sleep apnea. **HST 08/29/2018>> AHI 44, recommended CPAP auto with pressure range 5-20.  Medication:    Current Outpatient Medications:  .  amLODipine (NORVASC) 5 MG tablet, TAKE ONE TABLET BY MOUTH DAILY, Disp: 90 tablet, Rfl: 0 .  aspirin 81 MG tablet, Take 81 mg by mouth daily., Disp: , Rfl:  .  atorvastatin (LIPITOR) 40 MG tablet, TAKE 1 TABLET BY MOUTH EVERY DAY, Disp: 90 tablet, Rfl: 0 .  carvedilol (COREG) 25 MG tablet, TAKE ONE TABLET BY MOUTH TWICE A DAY WITH A MEAL, Disp: 180 tablet, Rfl: 1 .  isosorbide mononitrate (IMDUR) 60 MG 24 hr  tablet, Take 1 tablet (60 mg total) by mouth daily., Disp: 90 tablet, Rfl: 3 .  losartan (COZAAR) 100 MG tablet, TAKE ONE TABLET BY MOUTH DAILY, Disp: 90 tablet, Rfl: 1 .  Multiple Vitamin (MULTIVITAMIN WITH MINERALS) TABS tablet, Take 1 tablet by mouth daily., Disp: , Rfl:  .  naproxen sodium (ALEVE) 220 MG tablet, Take 440 mg by mouth 2 (two) times daily as needed (mild pain)., Disp: , Rfl:  .  pantoprazole (PROTONIX) 20 MG tablet, TAKE TWO TABLETS BY MOUTH TWICE A DAY, Disp: 180 tablet, Rfl: 1 .  potassium chloride (K-DUR) 10 MEQ tablet, Take 1 tablet (10 mEq total) by mouth 2 (two) times daily as needed. (Patient taking differently: Take 10 mEq by mouth See admin instructions. Take one every AM. Take two with every dose of metolazone.), Disp: 180 tablet, Rfl: 3 .  Sennosides (SENNA-EX PO), Take 1 tablet by mouth 2 (two) times daily. , Disp: , Rfl:  .  sildenafil (VIAGRA) 100 MG tablet, Take 100 mg by mouth daily as needed for erectile dysfunction., Disp: , Rfl:  .  torsemide (DEMADEX) 20 MG tablet, Take 2 tablets (40 mg total) by mouth 2 (two) times daily. (Patient taking differently: Take 40 mg by mouth 2 (two) times daily with breakfast and lunch. ), Disp: 120 tablet, Rfl: 6   Allergies:  Lisinopril  Review of Systems:  Constitutional: Feels well. Cardiovascular: Denies chest pain, exertional chest pain.  Pulmonary: Denies hemoptysis, pleuritic chest pain.   The remainder of systems were reviewed and were found to be negative other than what is documented in the HPI.    Physical Examination:   VS: BP 138/78 (BP Location: Left Arm, Cuff Size: Normal)   Pulse 69   Temp 98.5 F (36.9 C) (Oral)   Ht 5\' 7"  (1.702 m)   Wt 231 lb 12.8 oz (105.1 kg)   SpO2 97%   BMI 36.31 kg/m   General Appearance: No distress  Neuro:without focal findings, mental status, speech normal, alert and oriented HEENT: PERRLA, EOM intact Pulmonary: No wheezing, No rales  CardiovascularNormal S1,S2.  No  m/r/g.  Abdomen: Benign, Soft, non-tender, No masses Renal:  No costovertebral tenderness  GU:  No performed at this time. Endoc: No evident thyromegaly, no signs of acromegaly or Cushing features Skin:   warm, no rashes, no ecchymosis  Extremities: normal, no cyanosis, clubbing.      LABORATORY PANEL:   CBC No results for input(s): WBC, HGB, HCT, PLT in the last 168 hours. ------------------------------------------------------------------------------------------------------------------  Chemistries  No results for input(s): NA, K, CL, CO2, GLUCOSE, BUN, CREATININE, CALCIUM, MG, AST, ALT, ALKPHOS, BILITOT in the last 168 hours.  Invalid input(s): GFRCGP ------------------------------------------------------------------------------------------------------------------  Cardiac Enzymes No results for input(s): TROPONINI in the last 168 hours. ------------------------------------------------------------  RADIOLOGY:  No results found.     Thank  you for the consultation and for allowing [facility] to assist in the care of your patient. Our recommendations are noted above.  Please contact us if we can be of further service.   Wells Guiles, M.D., F.C.C.P.  Board Certified in Internal Medicine, Pulmonary Medicine, Critical Care Medicine, and Sleep Medicine.   Pulmonary and Critical Care Office Number: [phone]   11/02/2018

## 2018-11-02 NOTE — Patient Instructions (Signed)
Continue to use cpap every night.  

## 2018-11-03 NOTE — Telephone Encounter (Signed)
3 attempts to schedule . Deleting Recall.

## 2018-11-05 ENCOUNTER — Other Ambulatory Visit: Payer: Self-pay | Admitting: Cardiovascular Disease

## 2018-11-08 ENCOUNTER — Other Ambulatory Visit: Payer: Self-pay

## 2018-11-08 NOTE — Telephone Encounter (Signed)
Pharmacy requesting refills of isosorbide and torsemide. Please review for refill. Patient last seen 05/2018. Multiple attempts made to schedule F/U which were unsuccessful and recall was deleted on 11/03/2018.

## 2018-11-09 NOTE — Telephone Encounter (Signed)
Both of these medications were refilled by Dulcy Fanny, CMA on 11/08/18.

## 2018-11-18 ENCOUNTER — Other Ambulatory Visit: Payer: Self-pay | Admitting: Physician Assistant

## 2018-11-18 DIAGNOSIS — I10 Essential (primary) hypertension: Secondary | ICD-10-CM

## 2018-11-18 DIAGNOSIS — I25118 Atherosclerotic heart disease of native coronary artery with other forms of angina pectoris: Secondary | ICD-10-CM

## 2018-11-18 NOTE — Telephone Encounter (Signed)
Patient need a CPE in the summer.  LOV:05/18/2018 Last CPE:02/03/19

## 2018-11-18 NOTE — Telephone Encounter (Signed)
Pt needs refill on  Caredilol  25 mg  Jonathon Newman  teri

## 2018-11-18 NOTE — Telephone Encounter (Signed)
Rx requested has already been responded to.

## 2018-12-04 ENCOUNTER — Other Ambulatory Visit: Payer: Self-pay | Admitting: Physician Assistant

## 2018-12-04 ENCOUNTER — Other Ambulatory Visit: Payer: Self-pay | Admitting: Cardiovascular Disease

## 2018-12-04 DIAGNOSIS — I10 Essential (primary) hypertension: Secondary | ICD-10-CM

## 2018-12-12 NOTE — Progress Notes (Signed)
Cardiology Office Note Date:  12/15/2018  Patient ID:  Jonathon Newman, DOB 1956/01/23, MRN [id number] PCP:  Margaretann Loveless, PA-C  Cardiologist:  Dr. Kirke Corin, MD    Chief Complaint: Follow up  History of Present Illness: Jonathon Newman is a 63 y.o. male with history of CAD s/p 2-vessel CABG in 2016 at the [facility], HFpEF, HTN, HLD, sleep apnea on CPAP, and prior tobacco abuse who presents forfollow-up of  CAD and diastolic heart failure.   Patient is retired from [employer] though is no longer eligible for his care through the [employer]. He previously underwent Cypher drug-eluting stent to the RCA in 2010. This was followed by a NSTEMI in 2016 with cardiac cath showing a patent RCA stent with significant distal left main disease with an FFR of 0.76 and ostial LCx stenosis. He underwent 2-vessel CABG with a LIMA to LAD and SVG to OM2 in 2016. He was seen in1/2076for atypical chest pain and exertional dyspnea. He underwent Lexiscan Myoview on 07/08/2017 that showed no evidence of ischemia with a normal EF. When he was evaluated in 08/2017, he noted some improvement in symptoms but continued to have mild dyspnea and leg edema that was worse at the end of the day. He was taking torsemide 40 mg in the morning and 20 mg in the evening. Weight at that visit was 231 pounds which was up 3 pounds from 06/2017 visit.    Patient was seen as an add-on on 04/13/18 by Dr. Mariah Milling noting a 12-pound weight gain. Weight of 230 pounds at that visit. Patient reported a dry weight of ~ 220 pounds. Recent weight in 01/2018 at PCP office of 224 pounds. He reported compliance with medications and denied excess fluids or salt intake. He continued to take torsemide 40 mg in the morning and 20 mg in the afternoon with an occasional extra dose before bed.He reported compliance with his CPAP. He noted dyspnea with ambulation of 50-70 feet. BP was running high in the 160-180 systolic range. He also  noted dizziness in which he was concerned may be ischemia. Prior ENT workup was negative for vertigo. He was started on metolazone 5 mg for weight > 230 pounds and 2.5 mg for weight > 227 pounds, with recommendation to take torsemide 40 mg bid. He was also started on Imdur.Follow-up BMP on 04/20/2018 showed slight bump in serum creatinine to 1.31 with a baseline approximately 1.0 with recommendation to hold metolazone. Patient was seen in the office on 04/25/2018, noting significant improvement in his lower extremity edema, though continued to note abdominal distension, dizziness, and exertional chest pressure. He was concerned these symptoms were his anginal equivalent. He continued to take metolazone 5 mg on a daily basis, regardless of his home weight. His weight at his 11/11/ visit was noted to be 221 pounds. He was advised to hold metolazone. He requested a cardiac cath, though this was deferred until we could assess the stability of his renal function. Recheck bmet on 04/25/18 showed a worsening SCr from 1.31-->1.53. At that time, he was advised to hold torsemide as well. Recheck bmet on 04/29/18 showed a SCr of 1.05. He underwent diagnostic LHC on 05/02/2018 that showed significant underlying left main disease with patent grafts including LIMA to LAD and SVG to OM2. The RCA stent was patent with minimal restenosis. He had normal LVSF with a mildly elevated LVEDP at 14-15 mmHg. There was no culprit for his symptoms based on this cath.  Medical therapy was advised. He has subsequently been seen in the ED on 11/22 for radicular back pain with MRI of the spine showing moderate left L4-5 and L5-S1 neuro-foraminal stenosis.  He was most recently seen in the office on 05/18/2018 and continued to note intermittent dizziness that was not associated with positional changes.  At times, there were palpitations associated with his dizziness.  He reported previously [facility] that was unrevealing for significant arrhythmia in the setting of his dizziness.  He declined repeat cardiac monitoring.  His mild lower extremity swelling was stable and he remained on torsemide 40 mg twice daily.  It was felt there was some degree of dependent edema.  He was having to sleep in a recliner secondary to significant low back pain with radiculopathy.  He has subsequently been evaluated by pulmonology with suspicion that his CPAP was malfunctioning with recommendation to perform repeat sleep study followed by subsequent prescription for new CPAP in 08/2018.  He comes in doing reasonably well from a cardiac perspective.  No chest pain, shortness of breath, presyncope, syncope, lower extremity swelling, orthopnea, PND, or early satiety.  He continues to note occasional episodes of dizziness with prior work-up with ENT demonstrating vertigo.  Occasionally he will note some palpitations with this dizziness.  Weight has been stable at approximately 200 to 224 pounds.  Dry weight approximately 200 pounds.  Provided blood pressure readings show morning readings initially in the 150s to 160s systolic though more recently in the 1 teens to 130s systolic with p.m. readings in the 140s to 160s systolic consistently.  He does have concerns for possible underlying COPD given his prior tobacco abuse and prefers not to wear a mask in the setting of COVID-19 pandemic.  Labs: 05/2018- serum creatinine 1.12, potassium 3.8 04/2018- Hgb 14.9 01/2018- triglyceride 692, LDL not calculated, TSH normal, AST normal, ALT mildly elevated at 51 08/2017 - LDL 54     Past Medical History:  Diagnosis Date   Arthritis    CHF (congestive heart failure) (HCC)    Coronary artery disease    PCI and Cypher drug-eluting stent placement to the right coronary artery in 2010.  NSTEMI in 2016.  Cardiac catheterization showed patent RCA stent, significant distal left main disease with an FFR ratio of 0.76 and  ostial left circumflex stenosis.  Underwent CABG with LIMA to LAD and SVG to OM 2.   Dyspnea    GERD (gastroesophageal reflux disease)    Hypercholesteremia    Hypertension    S/P coronary artery bypass graft x 2    Sleep apnea    uses CPAP    Past Surgical History:  Procedure Laterality Date   APPENDECTOMY     BACK SURGERY  11/01/2017   SL 5 and S1   CARDIAC CATHETERIZATION     CARPAL TUNNEL RELEASE     left hand   CHOLECYSTECTOMY     CHOLECYSTECTOMY     CORONARY ARTERY BYPASS GRAFT  04/21/2015   CABG x 2 [facility] LIMA to LAD amd SVG to OM2   CORONARY STENT PLACEMENT  2010   Cordis Cypher Sirolimus-eluting stent 2.50 mm x 26 [facility]    ESOPHAGEAL MANOMETRY N/A 08/04/2017   Procedure: ESOPHAGEAL MANOMETRY (EM);  Surgeon: Toney ReilVanga, Rohini Reddy, MD;  Location: [facility] ENDOSCOPY;  Service: Endoscopy;  Laterality: N/A;   FRACTURE SURGERY     KNEE SURGERY  KNEE SURGERY     right knee    LAMINECTOMY     "plate in neck [id number]"   LEFT HEART CATH AND CORS/GRAFTS ANGIOGRAPHY N/A 05/02/2018   Procedure: CORS/GRAFTS ANGIOGRAPHY;  Surgeon: Iran OuchArida, Muhammad A, MD;  Location: [facility] INVASIVE CV LAB;  Service: Cardiovascular;  Laterality: N/A;   RIGHT/LEFT HEART CATH AND CORONARY ANGIOGRAPHY Bilateral 05/02/2018   Procedure: LEFT HEART CATH;  Surgeon: Iran OuchArida, Muhammad A, MD;  Location: [facility] INVASIVE CV LAB;  Service: Cardiovascular;  Laterality: Bilateral;   VASECTOMY      Current Meds  Medication Sig   amLODipine (NORVASC) 5 MG tablet TAKE ONE TABLET BY MOUTH DAILY   aspirin 81 MG tablet Take 81 mg by mouth daily.   atorvastatin (LIPITOR) 40 MG tablet TAKE ONE TABLET BY MOUTH DAILY   carvedilol (COREG) 25 MG tablet TAKE ONE TABLET BY MOUTH TWICE A DAY WITH MEALS   isosorbide mononitrate (IMDUR) 60 MG 24 hr tablet Take 1 tablet (60 mg total) by mouth daily.   losartan (COZAAR) 100 MG tablet TAKE ONE TABLET BY MOUTH  DAILY   Multiple Vitamin (MULTIVITAMIN WITH MINERALS) TABS tablet Take 1 tablet by mouth daily.   naproxen sodium (ALEVE) 220 MG tablet Take 440 mg by mouth 2 (two) times daily as needed (mild pain).   pantoprazole (PROTONIX) 20 MG tablet TAKE TWO TABLETS BY MOUTH TWICE A DAY   potassium chloride (K-DUR) 10 MEQ tablet Take 1 tablet (10 mEq total) by mouth 2 (two) times daily as needed. (Patient taking differently: Take 10 mEq by mouth See admin instructions. Take one every AM. Take two with every dose of metolazone.)   Sennosides (SENNA-EX PO) Take 1 tablet by mouth 2 (two) times daily.    sildenafil (VIAGRA) 100 MG tablet Take 100 mg by mouth daily as needed for erectile dysfunction.   torsemide (DEMADEX) 20 MG tablet TAKE TWO TABLETS BY MOUTH TWICE A DAY PT NEEDS APPOINTMENT FOR FURTHER REFILLS    Allergies:   Lisinopril   Social History:  The patient  reports that he quit smoking about 13 years ago. His smoking use included cigarettes. He has a 0.50 pack-year smoking history. He has quit using smokeless tobacco.  His smokeless tobacco use included chew. He reports that he does not drink alcohol or use drugs.   Family History:  [name] family history includes Arthritis in his mother; Cancer in his maternal grandfather; Cataracts in his maternal grandmother; Glaucoma in his maternal grandmother; Heart attack in his paternal grandfather; Heart attack (age of onset: 7468) in his father; Heart murmur in his brother; Hyperlipidemia in his father; Hypertension in his father and paternal grandfather; Valvular heart disease in his brother.  ROS:   Review of Systems  Constitutional: Positive for malaise/fatigue. Negative for chills, diaphoresis, fever and weight loss.  HENT: Negative for congestion.   Eyes: Negative for discharge and redness.  Respiratory: Negative for cough, hemoptysis, sputum production, shortness of breath and wheezing.   Cardiovascular: Negative for chest pain,  palpitations, orthopnea, claudication, leg swelling and PND.  Gastrointestinal: Negative for abdominal pain, blood in stool, heartburn, melena, nausea and vomiting.  Genitourinary: Negative for hematuria.  Musculoskeletal: Negative for falls and myalgias.  Skin: Negative for rash.  Neurological: Positive for dizziness. Negative for tingling, tremors, sensory change, speech change, focal weakness, loss of consciousness and weakness.  Endo/Heme/Allergies: Does not bruise/bleed easily.  Psychiatric/Behavioral: Negative for substance abuse. The patient is not nervous/anxious.   All other systems reviewed and are  negative.    PHYSICAL EXAM:  VS:  BP 120/80 (BP Location: Left Arm, Patient Position: Sitting, Cuff Size: Normal)    Pulse 74    Temp (!) 97 F (36.1 C)    Ht 5\' 7"  (1.702 m)    Wt 224 lb 8 oz (101.8 kg)    SpO2 94%    BMI 35.16 kg/m  BMI: Body mass index is 35.16 kg/m.  Physical Exam  Constitutional: He is oriented to person, place, and time. He appears well-developed and well-nourished.  HENT:  Head: Normocephalic and atraumatic.  Eyes: Right eye exhibits no discharge. Left eye exhibits no discharge.  Neck: Normal range of motion. No JVD present.  Cardiovascular: Normal rate, regular rhythm, S1 normal, S2 normal and normal heart sounds. Exam reveals no distant heart sounds, no friction rub, no midsystolic click and no opening snap.  No murmur heard. Pulses:      Posterior tibial pulses are 2+ on the right side and 2+ on the left side.  Pulmonary/Chest: Effort normal and breath sounds normal. No respiratory distress. He has no decreased breath sounds. He has no wheezes. He has no rales. He exhibits no tenderness.  Abdominal: Soft. He exhibits no distension. There is no abdominal tenderness.  Musculoskeletal:        General: No edema.  Neurological: He is alert and oriented to person, place, and time.  Skin: Skin is warm and dry. No cyanosis. Nails show no clubbing.  Psychiatric:  He has a normal mood and affect. His speech is normal and behavior is normal. Judgment and thought content normal.     EKG:  Was ordered and interpreted by me today. Shows NSR, 74 bpm, prior inferior infarct, no acute st/t changes   Recent Labs: 02/02/2018: ALT 51; TSH 1.600 04/25/2018: Hemoglobin 14.9; Platelets 288 05/18/2018: BUN 19; Creatinine, Ser 1.12; Potassium 3.8; Sodium 137  02/02/2018: Chol/HDL Ratio 5.3; Cholesterol, Total 185; HDL 35; LDL Calculated Comment; Triglycerides 692   CrCl cannot be calculated (Patient's most recent lab result is older than the maximum 21 days allowed.).   Wt Readings from Last 3 Encounters:  12/15/18 224 lb 8 oz (101.8 kg)  11/02/18 231 lb 12.8 oz (105.1 kg)  09/01/18 231 lb (104.8 kg)     Other studies reviewed: Additional studies/records reviewed today include: summarized above  ASSESSMENT AND PLAN:  1. CAD status post CABG without angina: No symptoms concerning for angina.  Recent right and left cardiac cath in 04/2018 demonstrated significant underlying left main and RCA disease with patent grafts including LIMA to LAD and SVG to OM 2 with patent RCA stent with minimal restenosis.  Continue aspirin, Lipitor, Coreg, and Imdur.  Aggressive risk factor modification and secondary prevention.  No plans for ischemic evaluation at this time.  2. HFpEF: He appears euvolemic and well compensated.  Continue current dose of torsemide.  Recommend he minimize metolazone use as outlined below.  3. Dizziness: Prior work-up with ENT has demonstrated vertigo.  He reports previous MRIs of the brain being unrevealing.  He reports prior cardiac monitoring being unrevealing.  He states again that he has been found to require cardiac stenting in the setting of dizziness.  However, we have recently undergone cardiac catheterization in late 2019 for this and he was demonstrated to have patent bypass grafts as well as patent stent in the RCA with minimal restenosis.   Thus far, there has been no cardiac explanation for the patient's dizziness.  I did offer him a  repeat MRI of the brain which he deferred.  He can follow-up with his PCP for this.  We will obtain Zio monitor to evaluate for arrhythmia.  If this is unrevealing, no further cardiac work-up is needed at this time and he should follow-up with PCP/ENT.  Check CBC.  4. Hypertension: Blood pressure is well controlled in the office today.  However, his evening blood pressures typically run in the 140s 150s systolic.  In this setting, we will have him take Norvasc 5 mg twice daily.  Otherwise, he will remain on carvedilol, Imdur, losartan, and torsemide.  5. Possible COPD: Patient requests further evaluation.  In this setting, we will refer him to pulmonology for PFTs.  6. History of AKI: He continues to take metolazone sporadically, approximately once per week.  Check BMP.  Advised the patient I would prefer him not to take metolazone unless absolutely needed.  7. Sleep apnea: Referred to pulmonology as above.  8. Hyperlipidemia: LDL of 54 from 08/2017.  Remains on atorvastatin 40 mg daily.  Disposition: F/u with Dr. Kirke CorinArida or an APP in 6 months.  Current medicines are reviewed at length with the patient today.  The patient did not have any concerns regarding medicines.  Signed, Eula Listenyan Mouna Yager, PA-C 12/15/2018 8:08 AM     [facility] [address] [place], [place] [address] [phone]

## 2018-12-14 ENCOUNTER — Telehealth: Payer: Self-pay

## 2018-12-14 NOTE — Telephone Encounter (Signed)

## 2018-12-15 ENCOUNTER — Telehealth: Payer: Self-pay | Admitting: Cardiovascular Disease

## 2018-12-15 ENCOUNTER — Ambulatory Visit (INDEPENDENT_AMBULATORY_CARE_PROVIDER_SITE_OTHER): Admitting: Physician Assistant

## 2018-12-15 ENCOUNTER — Ambulatory Visit (INDEPENDENT_AMBULATORY_CARE_PROVIDER_SITE_OTHER)

## 2018-12-15 ENCOUNTER — Other Ambulatory Visit: Payer: Self-pay

## 2018-12-15 ENCOUNTER — Encounter: Payer: Self-pay | Admitting: Physician Assistant

## 2018-12-15 VITALS — BP 120/80 | HR 74 | Temp 97.0°F | Ht 67.0 in | Wt 224.5 lb

## 2018-12-15 DIAGNOSIS — R42 Dizziness and giddiness: Secondary | ICD-10-CM

## 2018-12-15 DIAGNOSIS — I10 Essential (primary) hypertension: Secondary | ICD-10-CM | POA: Diagnosis not present

## 2018-12-15 DIAGNOSIS — N179 Acute kidney failure, unspecified: Secondary | ICD-10-CM

## 2018-12-15 DIAGNOSIS — I2510 Atherosclerotic heart disease of native coronary artery without angina pectoris: Secondary | ICD-10-CM

## 2018-12-15 DIAGNOSIS — R0602 Shortness of breath: Secondary | ICD-10-CM

## 2018-12-15 DIAGNOSIS — I5032 Chronic diastolic (congestive) heart failure: Secondary | ICD-10-CM | POA: Diagnosis not present

## 2018-12-15 DIAGNOSIS — E785 Hyperlipidemia, unspecified: Secondary | ICD-10-CM

## 2018-12-15 MED ORDER — AMLODIPINE BESYLATE 5 MG PO TABS: 5.0000 mg | ORAL_TABLET | Freq: Two times a day (BID) | ORAL | 3 refills | Status: DC

## 2018-12-15 NOTE — Telephone Encounter (Signed)
Left voicemail message to call back  

## 2018-12-15 NOTE — Telephone Encounter (Signed)
Patient calling States the heart monitor that was placed is sitting right wear his seatbelt goes and is uncomfortable  Would like to know if there is anything he can do Please call to discuss - may call cell or work at [phone]

## 2018-12-15 NOTE — Telephone Encounter (Signed)
Spoke with patient and he wanted to know about moving monitor to avoid seatbelt area. Advised that was where it should be to get good monitor results. He verbalized understanding with no further questions at this time.

## 2018-12-15 NOTE — Patient Instructions (Addendum)
Medication Instructions:  Your physician has recommended you make the following change in your medication:  1. INCREASE Amlodipine 5 mg to twice a day   If you need a refill on your cardiac medications before your next appointment, please call your pharmacy.   Lab work: BMET & CBC done today  If you have labs (blood work) drawn today and your tests are completely normal, you will receive your results only by: Marland Kitchen MyChart Message (if you have MyChart) OR . A paper copy in the mail If you have any lab test that is abnormal or we need to change your treatment, we will call you to review the results.  Testing/Procedures: Your physician has recommended that you wear a Zio monitor. This monitor is a medical device that records the heart's electrical activity. Doctors most often use these monitors to diagnose arrhythmias. Arrhythmias are problems with the speed or rhythm of the heartbeat. The monitor is a small device applied to your chest. You can wear one while you do your normal daily activities. While wearing this monitor if you have any symptoms to push the button and record what you felt. Once you have worn this monitor for the period of time provider prescribed (Usually 14 days), you will return the monitor device in the postage paid box. Once it is returned they will download the data collected and provide [place] with a report which the provider will then review and we will call you with those results. Important tips:  1. Avoid showering during the first 24 hours of wearing the monitor. 2. Avoid excessive sweating to help maximize wear time. 3. Do not submerge the device, no hot tubs, and no swimming pools. 4. Keep any lotions or oils away from the patch. 5. After 24 hours you may shower with the patch on. Take brief showers with your back facing the shower head.  6. Do not remove patch once it has been placed because that will interrupt data and decrease adhesive wear time. 7. Push the button  when you have any symptoms and write down what you were feeling. 8. Once you have completed wearing your monitor, remove and place into box which has postage paid and place in your outgoing mailbox.  9. If for some reason you have misplaced your box then call our office and we can provide another box and/or mail it off for you.        Follow-Up: At [facility], you and your health needs are our priority.  As part of our continuing mission to provide you with exceptional heart care, we have created designated Provider Care Teams.  These Care Teams include your primary Cardiologist (physician) and Advanced Practice Providers (APPs -  Physician Assistants and Nurse Practitioners) who all work together to provide you with the care you need, when you need it. You will need a follow up appointment in 6 months.  Please call our office 2 months in advance to schedule this appointment.  You may see Dr. Kirke Corin or one of the following Advanced Practice Providers on your designated Care Team:   Nicolasa Ducking, NP Eula Listen, PA-C . Marisue Ivan, PA-C  Any Other Special Instructions Will Be Listed Below (If Applicable).  Referral placed for Pulmonary for further evaluation of possible COPD and potential pulmonary function testing.       Dizziness Dizziness is a common problem. It makes you feel unsteady or light-headed. You may feel like you are about to pass out (faint). Dizziness can  lead to getting hurt if you stumble or fall. Dizziness can be caused by many things, including:  Medicines.  Not having enough water in your body (dehydration).  Illness. Follow these instructions at home: Eating and drinking   Drink enough fluid to keep your pee (urine) clear or pale yellow. This helps to keep you from getting dehydrated. Try to drink more clear fluids, such as water.  Do not drink alcohol.  Limit how much caffeine you drink or eat, if your doctor tells you to do that.  Limit how  much salt (sodium) you drink or eat, if your doctor tells you to do that. Activity   Avoid making quick movements. ? When you stand up from sitting in a chair, steady yourself until you feel okay. ? In the morning, first sit up on the side of the bed. When you feel okay, stand slowly while you hold onto something. Do this until you know that your balance is fine.  If you need to stand in one place for a long time, move your legs often. Tighten and relax the muscles in your legs while you are standing.  Do not drive or use heavy machinery if you feel dizzy.  Avoid bending down if you feel dizzy. Place items in your home so you can reach them easily without leaning over. Lifestyle  Do not use any products that contain nicotine or tobacco, such as cigarettes and e-cigarettes. If you need help quitting, ask your doctor.  Try to lower your stress level. You can do this by using methods such as yoga or meditation. Talk with your doctor if you need help. General instructions  Watch your dizziness for any changes.  Take over-the-counter and prescription medicines only as told by your doctor. Talk with your doctor if you think that you are dizzy because of a medicine that you are taking.  Tell a friend or a family member that you are feeling dizzy. If he or she notices any changes in your behavior, have this person call your doctor.  Keep all follow-up visits as told by your doctor. This is important. Contact a doctor if:  Your dizziness does not go away.  Your dizziness or light-headedness gets worse.  You feel sick to your stomach (nauseous).  You have trouble hearing.  You have new symptoms.  You are unsteady on your feet.  You feel like the room is spinning. Get help right away if:  You throw up (vomit) or have watery poop (diarrhea), and you cannot eat or drink anything.  You have trouble: ? Talking. ? Walking. ? Swallowing. ? Using your arms, hands, or legs.  You feel  generally weak.  You are not thinking clearly, or you have trouble forming sentences. A friend or family member may notice this.  You have: ? Chest pain. ? Pain in your belly (abdomen). ? Shortness of breath. ? Sweating.  Your vision changes.  You are bleeding.  You have a very bad headache.  You have neck pain or a stiff neck.  You have a fever. These symptoms may be an emergency. Do not wait to see if the symptoms will go away. Get medical help right away. Call your local emergency services (911 in the [place] Do not drive yourself to the hospital. Summary  Dizziness makes you feel unsteady or light-headed. You may feel like you are about to pass out (faint).  Drink enough fluid to keep your pee (urine) clear or pale yellow. Do not  drink alcohol.  Avoid making quick movements if you feel dizzy.  Watch your dizziness for any changes. This information is not intended to replace advice given to you by your health care provider. Make sure you discuss any questions you have with your health care provider. Document Released: 05/21/2011 Document Revised: 06/04/2017 Document Reviewed: 06/18/2016 Elsevier Patient Education  2020 [place].   Chronic Obstructive Pulmonary Disease Chronic obstructive pulmonary disease (COPD) is a long-term (chronic) lung problem. When you have COPD, it is hard for air to get in and out of your lungs. Usually the condition gets worse over time, and your lungs will never return to normal. There are things you can do to keep yourself as healthy as possible.  Your doctor may treat your condition with: ? Medicines. ? Oxygen. ? Lung surgery.  Your doctor may also recommend: ? Rehabilitation. This includes steps to make your body work better. It may involve a team of specialists. ? Quitting smoking, if you smoke. ? Exercise and changes to your diet. ? Comfort measures (palliative care). Follow these instructions at home: Medicines  Take  over-the-counter and prescription medicines only as told by your doctor.  Talk to your doctor before taking any cough or allergy medicines. You may need to avoid medicines that cause your lungs to be dry. Lifestyle  If you smoke, stop. Smoking makes the problem worse. If you need help quitting, ask your doctor.  Avoid being around things that make your breathing worse. This may include smoke, chemicals, and fumes.  Stay active, but remember to rest as well.  Learn and use tips on how to relax.  Make sure you get enough sleep. Most adults need at least 7 hours of sleep every night.  Eat healthy foods. Eat smaller meals more often. Rest before meals. Controlled breathing Learn and use tips on how to control your breathing as told by your doctor. Try:  Breathing in (inhaling) through your nose for 1 second. Then, pucker your lips and breath out (exhale) through your lips for 2 seconds.  Putting one hand on your belly (abdomen). Breathe in slowly through your nose for 1 second. Your hand on your belly should move out. Pucker your lips and breathe out slowly through your lips. Your hand on your belly should move in as you breathe out.  Controlled coughing Learn and use controlled coughing to clear mucus from your lungs. Follow these steps: 1. Lean your head a little forward. 2. Breathe in deeply. 3. Try to hold your breath for 3 seconds. 4. Keep your mouth slightly open while coughing 2 times. 5. Spit any mucus out into a tissue. 6. Rest and do the steps again 1 or 2 times as needed. General instructions  Make sure you get all the shots (vaccines) that your doctor recommends. Ask your doctor about a flu shot and a pneumonia shot.  Use oxygen therapy and pulmonary rehabilitation if told by your doctor. If you need home oxygen therapy, ask your doctor if you should buy a tool to measure your oxygen level (oximeter).  Make a COPD action plan with your doctor. This helps you to know what  to do if you feel worse than usual.  Manage any other conditions you have as told by your doctor.  Avoid going outside when it is very hot, cold, or humid.  Avoid people who have a sickness you can catch (contagious).  Keep all follow-up visits as told by your doctor. This is important. Contact a  doctor if:  You cough up more mucus than usual.  There is a change in the color or thickness of the mucus.  It is harder to breathe than usual.  Your breathing is faster than usual.  You have trouble sleeping.  You need to use your medicines more often than usual.  You have trouble doing your normal activities such as getting dressed or walking around the house. Get help right away if:  You have shortness of breath while resting.  You have shortness of breath that stops you from: ? Being able to talk. ? Doing normal activities.  Your chest hurts for longer than 5 minutes.  Your skin color is more blue than usual.  Your pulse oximeter shows that you have low oxygen for longer than 5 minutes.  You have a fever.  You feel too tired to breathe normally. Summary  Chronic obstructive pulmonary disease (COPD) is a long-term lung problem.  The way your lungs work will never return to normal. Usually the condition gets worse over time. There are things you can do to keep yourself as healthy as possible.  Take over-the-counter and prescription medicines only as told by your doctor.  If you smoke, stop. Smoking makes the problem worse. This information is not intended to replace advice given to you by your health care provider. Make sure you discuss any questions you have with your health care provider. Document Released: 11/18/2007 Document Revised: 05/14/2017 Document Reviewed: 07/06/2016 Elsevier Patient Education  2020 [employer]vier Inc.  Pulmonary Function Tests Pulmonary function tests (PFTs) are used to measure how well your lungs work, find out what is causing your lung  problems, and figure out the best treatment for you. You may have PFTs:  When you have an illness involving the lungs.  To follow changes in your lung function over time if you have a chronic lung disease.  If you are an IT trainerindustrial plant worker. This checks the effects of being exposed to chemicals over a long period of time.  To check lung function before having surgery or other procedures.  To check your lungs if you smoke.  To check if prescribed medicines or treatments are helping your lungs. Your results will be compared to the expected lung function of someone with healthy lungs who is similar to you in:  Age.  Gender.  Height.  Weight.  Race or ethnicity. This is done to show how your lungs compare to normal lung function (percent predicted). This is how your health care provider knows if your lung function is normal or not. If you have had PFTs done before, your health care provider will compare your current results with past results. This shows if your lung function is better, worse, or the same as before. Tell a health care provider about:  Any allergies you have.  All medicines you are taking, including inhaler or nebulizer medicines, vitamins, herbs, eye drops, creams, and over-the-counter medicines.  Any blood disorders you have.  Any surgeries you have had, especially recent eye surgery, abdominal surgery, or chest surgery. These can make PFTs difficult or unsafe.  Any medical conditions you have, including chest pain or heart problems, tuberculosis, or respiratory infections such as pneumonia, a cold, or the flu.  Any fear of being in closed spaces (claustrophobia). Some of your tests may be in a closed space. What are the risks? Generally, this is a safe procedure. However, problems may occur, including:  Light-headedness due to over-breathing (hyperventilation).  An asthma attack  from deep breathing.  A collapsed lung. What happens before the procedure?   Take over-the-counter and prescription medicines only as told by your health care provider. If you take inhaler or nebulizer medicines, ask your health care provider which medicines you should take on the day of your testing. Some inhaler medicines may interfere with PFTs if they are taken shortly before the tests.  Follow your health care provider's instructions on eating and drinking restrictions. This may include avoiding eating large meals and drinking alcohol before the testing.  Do not use any products that contain nicotine or tobacco, such as cigarettes and e-cigarettes. If you need help quitting, ask your health care provider.  Wear comfortable clothing that will not interfere with breathing. What happens during the procedure?   You will be given a soft nose clip to wear. This is done so all of your breaths will go through your mouth instead of your nose.  You will be given a germ-free (sterile) mouthpiece. It will be attached to a machine that measures your breathing (spirometer).  You will be asked to do various breathing maneuvers. The maneuvers will be done by breathing in (inhaling) and breathing out (exhaling). You may be asked to repeat the maneuvers several times before the testing is done.  It is important to follow the instructions exactly to get accurate results. Make sure to blow as hard and as fast as you can when you are told to do so.  You may be given a medicine that makes the small air passages in your lungs larger (bronchodilator) after testing has been done. This medicine will make it easier for you to breathe.  The tests will be repeated after the bronchodilator has taken effect.  You will be monitored carefully during the procedure for faintness, dizziness, trouble breathing, or any other problems. The procedure may vary among health care providers and hospitals. What happens after the procedure?  It is up to you to get your test results. Ask your health care  provider, or the department that is doing the tests, when your results will be ready. After you have received your test results, talk with your health care provider about treatment options, if necessary. Summary  Pulmonary function tests (PFTs) are used to measure how well your lungs work, find out what is causing your lung problems, and figure out the best treatment for you.  Wear comfortable clothing that will not interfere with breathing.  It is up to you to get your test results. After you have received them, talk with your health care provider about treatment options, if necessary. This information is not intended to replace advice given to you by your health care provider. Make sure you discuss any questions you have with your health care provider. Document Released: 01/23/2004 Document Revised: 05/29/2016 Document Reviewed: 04/23/2016 Elsevier Patient Education  2020 [employer]

## 2018-12-16 LAB — CBC
Hematocrit: 43.1 % (ref 37.5–51.0)
Hemoglobin: 15.2 g/dL (ref 13.0–17.7)
MCH: 30.6 pg (ref 26.6–33.0)
MCHC: 35.3 g/dL (ref 31.5–35.7)
MCV: 87 fL (ref 79–97)
Platelets: 286 10*3/uL (ref 150–450)
RBC: 4.96 x10E6/uL (ref 4.14–5.80)
RDW: 14.5 % (ref 11.6–15.4)
WBC: 8 10*3/uL (ref 3.4–10.8)

## 2018-12-16 LAB — BASIC METABOLIC PANEL
BUN/Creatinine Ratio: 24 (ref 10–24)
BUN: 41 mg/dL — ABNORMAL HIGH (ref 8–27)
CO2: 25 mmol/L (ref 20–29)
Calcium: 10.1 mg/dL (ref 8.6–10.2)
Chloride: 90 mmol/L — ABNORMAL LOW (ref 96–106)
Creatinine, Ser: 1.68 mg/dL — ABNORMAL HIGH (ref 0.76–1.27)
GFR calc Af Amer: 50 mL/min/{1.73_m2} — ABNORMAL LOW (ref >59)
GFR calc non Af Amer: 43 mL/min/{1.73_m2} — ABNORMAL LOW (ref >59)
Glucose: 144 mg/dL — ABNORMAL HIGH (ref 65–99)
Potassium: 3.4 mmol/L — ABNORMAL LOW (ref 3.5–5.2)
Sodium: 136 mmol/L (ref 134–144)

## 2018-12-20 ENCOUNTER — Telehealth: Payer: Self-pay | Admitting: *Deleted

## 2018-12-20 DIAGNOSIS — I5032 Chronic diastolic (congestive) heart failure: Secondary | ICD-10-CM

## 2018-12-20 DIAGNOSIS — Z79899 Other long term (current) drug therapy: Secondary | ICD-10-CM

## 2018-12-20 MED ORDER — TORSEMIDE 20 MG PO TABS: 20.0000 mg | ORAL_TABLET | Freq: Every day | ORAL | 2 refills | Status: DC

## 2018-12-20 NOTE — Telephone Encounter (Signed)
Results called to pt. Pt verbalized understanding. He agreed to stop metalozone, call PCP about blood glucose and to decrease torsemide to 20 mg daily. He will go to the [facility] around 7/20 for repeat lab work. He will let us know if he experiences any increase swelling or weight gain.

## 2018-12-20 NOTE — Telephone Encounter (Signed)
Patient returning call.

## 2018-12-20 NOTE — Telephone Encounter (Signed)
No answer. Left message to call back on home number.  Mobile number listed is for a "debt lender." Did not leave a message.

## 2018-12-20 NOTE — Telephone Encounter (Signed)
-----   Message from Rise Mu, PA-C sent at 12/16/2018  8:16 AM EDT ----- Random glucose is elevated.  Renal function is slightly higher than his baseline. Potassium is slightly low.  Blood count is normal.   Recommendations: -Follow up with PCP for hyperglycemia, this may be playing a role in his dizziness.  -Do not continue to take metolazone.  -Reduce torsemide to 20 mg daily. -Recheck BMET in 2 weeks to reassess renal function.

## 2018-12-22 ENCOUNTER — Other Ambulatory Visit: Payer: Self-pay

## 2018-12-22 ENCOUNTER — Ambulatory Visit (INDEPENDENT_AMBULATORY_CARE_PROVIDER_SITE_OTHER): Admitting: Physician Assistant

## 2018-12-22 ENCOUNTER — Encounter: Payer: Self-pay | Admitting: Physician Assistant

## 2018-12-22 VITALS — BP 120/75 | HR 76 | Temp 98.5°F | Resp 16 | Ht 67.0 in | Wt 231.0 lb

## 2018-12-22 DIAGNOSIS — R7309 Other abnormal glucose: Secondary | ICD-10-CM

## 2018-12-22 DIAGNOSIS — I25119 Atherosclerotic heart disease of native coronary artery with unspecified angina pectoris: Secondary | ICD-10-CM

## 2018-12-22 DIAGNOSIS — Z951 Presence of aortocoronary bypass graft: Secondary | ICD-10-CM

## 2018-12-22 DIAGNOSIS — E8881 Metabolic syndrome: Secondary | ICD-10-CM

## 2018-12-22 DIAGNOSIS — R7303 Prediabetes: Secondary | ICD-10-CM

## 2018-12-22 DIAGNOSIS — I5033 Acute on chronic diastolic (congestive) heart failure: Secondary | ICD-10-CM

## 2018-12-22 DIAGNOSIS — I200 Unstable angina: Secondary | ICD-10-CM

## 2018-12-22 DIAGNOSIS — Z6836 Body mass index (BMI) 36.0-36.9, adult: Secondary | ICD-10-CM

## 2018-12-22 DIAGNOSIS — G4733 Obstructive sleep apnea (adult) (pediatric): Secondary | ICD-10-CM

## 2018-12-22 LAB — POCT GLYCOSYLATED HEMOGLOBIN (HGB A1C)
Est. average glucose Bld gHb Est-mCnc: 131
Hemoglobin A1C: 6.2 % — AB (ref 4.0–5.6)

## 2018-12-22 MED ORDER — SAXENDA 18 MG/3ML ~~LOC~~ SOPN: 0.6000 mg | PEN_INJECTOR | Freq: Every day | SUBCUTANEOUS | 5 refills | Status: DC

## 2018-12-22 NOTE — Progress Notes (Signed)
Patient: Jonathon Newman Male    DOB: 07/27/1955   63 y.o.   MRN: [id number] Visit Date: 01/02/2019  Today's Provider: Margaretann LovelessJennifer M Anessia [place], PA-C   Chief Complaint  Patient presents with  . Follow-up   Subjective:     HPI   Patient is here to follow up from his last visit with his cardiologist. He was seen by Eula Listenyan Dunn, PA-C on 12/15/18 and had labs drawn. Labs revealed hyperglycemia. Was felt could be contributing to his dizziness thus this appointment was made for further evaluation.   Allergies  Allergen Reactions  . Lisinopril Cough      Review of Systems  Constitutional: Negative for appetite change, chills and fever.  Respiratory: Negative for chest tightness, shortness of breath and wheezing.   Cardiovascular: Negative for chest pain and palpitations.  Gastrointestinal: Negative for abdominal pain, nausea and vomiting.  Endocrine: Negative for polydipsia, polyphagia and polyuria.  Neurological: Positive for dizziness (intermittent). Negative for syncope, weakness, light-headedness, numbness and headaches.    Social History   Tobacco Use  . Smoking status: Former Smoker    Packs/day: 0.25    Years: 2.00    Pack years: 0.50    Types: Cigarettes    Quit date: 05/26/2005    Years since quitting: 13.6  . Smokeless tobacco: Former NeurosurgeonUser    Types: Chew  Substance Use Topics  . Alcohol use: No      Objective:   BP 120/75 (BP Location: Right Arm, Patient Position: Sitting, Cuff Size: Large)   Pulse 76   Temp 98.5 F (36.9 C) (Oral)   Resp 16   Ht 5\' 7"  (1.702 m)   Wt 231 lb (104.8 kg)   SpO2 95%   BMI 36.18 kg/m  Vitals:   12/22/18 1555  BP: 120/75  Pulse: 76  Resp: 16  Temp: 98.5 F (36.9 C)  TempSrc: Oral  SpO2: 95%  Weight: 231 lb (104.8 kg)  Height: 5\' 7"  (1.702 m)     Physical Exam Vitals signs reviewed.  Constitutional:      General: He is not in acute distress.    Appearance: Normal appearance. He is well-developed. He is obese.  He is not ill-appearing or diaphoretic.  HENT:     Head: Normocephalic and atraumatic.     Right Ear: Tympanic membrane, ear canal and external ear normal.     Left Ear: Tympanic membrane, ear canal and external ear normal.     Nose: Nose normal.     Mouth/Throat:     Mouth: Mucous membranes are moist.  Eyes:     General: No scleral icterus.    Extraocular Movements: Extraocular movements intact.     Right eye: No nystagmus.     Left eye: No nystagmus.     Conjunctiva/sclera: Conjunctivae normal.     Pupils: Pupils are equal, round, and reactive to light.  Neck:     Musculoskeletal: Normal range of motion and neck supple.     Thyroid: No thyromegaly.     Vascular: No JVD.     Trachea: No tracheal deviation.  Cardiovascular:     Rate and Rhythm: Normal rate and regular rhythm.     Pulses: Normal pulses.     Heart sounds: Normal heart sounds. No murmur. No friction rub. No gallop.   Pulmonary:     Effort: Pulmonary effort is normal. No respiratory distress.     Breath sounds: Normal breath sounds. No wheezing or rales.  Lymphadenopathy:     Cervical: No cervical adenopathy.  Skin:    Capillary Refill: Capillary refill takes less than 2 seconds.  Neurological:     General: No focal deficit present.     Mental Status: He is alert and oriented to person, place, and time. Mental status is at baseline.     Cranial Nerves: No cranial nerve deficit.     Motor: No weakness.     Coordination: Coordination normal.     Gait: Gait normal.  Psychiatric:        Mood and Affect: Mood normal.        Behavior: Behavior normal.        Thought Content: Thought content normal.        Judgment: Judgment normal.      Results for orders placed or performed in visit on 12/22/18  POCT glycosylated hemoglobin (Hb A1C)  Result Value Ref Range   Hemoglobin A1C 6.2 (A) 4.0 - 5.6 %   Est. average glucose Bld gHb Est-mCnc 131        Assessment & Plan    1. Elevated glucose A1c today shows  prediabetes with A1c of 6.2. Due to patient's cardiovascular comorbidities, prediabetes and obesity he would be classified as metabolic syndrome and could benefit from medically assisted weight loss. I feel Bernie CoveySaxenda would be the best option secondary to all his comorbidities for its safety, efficacy, and ability to use more long term. Will order as below. Once patient has pen he is to call and schedule appt to be instructed on use. He is in agreement.  - Liraglutide -Weight Management (SAXENDA) 18 MG/3ML SOPN; Inject 0.6 mg into the skin daily. Increase by 0.6mg  weekly until max dose of 3mg  achieved  Dispense: 4 pen; Refill: 5  2. Class 2 severe obesity due to excess calories with serious comorbidity and body mass index (BMI) of 36.0 to 36.9 in adult [facility](HCC) See above medical treatment plan. - Liraglutide -Weight Management (SAXENDA) 18 MG/3ML SOPN; Inject 0.6 mg into the skin daily. Increase by 0.6mg  weekly until max dose of 3mg  achieved  Dispense: 4 pen; Refill: 5  3. Prediabetes See above medical treatment plan. - Liraglutide -Weight Management (SAXENDA) 18 MG/3ML SOPN; Inject 0.6 mg into the skin daily. Increase by 0.6mg  weekly until max dose of 3mg  achieved  Dispense: 4 pen; Refill: 5  4. Acute on chronic diastolic congestive heart failure (HCC) Just had metalozone changed back to torsemide. Having repeat BMP in 6 weeks.  - Liraglutide -Weight Management (SAXENDA) 18 MG/3ML SOPN; Inject 0.6 mg into the skin daily. Increase by 0.6mg  weekly until max dose of 3mg  achieved  Dispense: 4 pen; Refill: 5  5. Unstable angina (HCC) Followed by Cardiology.  - Liraglutide -Weight Management (SAXENDA) 18 MG/3ML SOPN; Inject 0.6 mg into the skin daily. Increase by 0.6mg  weekly until max dose of 3mg  achieved  Dispense: 4 pen; Refill: 5  6. Coronary artery disease involving native coronary artery of native heart with angina pectoris (HCC) Followed by cardiology.  - Liraglutide -Weight Management (SAXENDA) 18  MG/3ML SOPN; Inject 0.6 mg into the skin daily. Increase by 0.6mg  weekly until max dose of 3mg  achieved  Dispense: 4 pen; Refill: 5  7. OSA (obstructive sleep apnea) Comorbidity for weight loss.  - Liraglutide -Weight Management (SAXENDA) 18 MG/3ML SOPN; Inject 0.6 mg into the skin daily. Increase by 0.6mg  weekly until max dose of 3mg  achieved  Dispense: 4 pen; Refill: 5  8. S/P coronary artery bypass  graft x 2 Followed by Cardiology.  - Liraglutide -Weight Management (SAXENDA) 18 MG/3ML SOPN; Inject 0.6 mg into the skin daily. Increase by 0.6mg  weekly until max dose of 3mg  achieved  Dispense: 4 pen; Refill: [place], PA-C  [facility]

## 2018-12-22 NOTE — Patient Instructions (Signed)
Liraglutide injection (Weight Management) What is this medicine? LIRAGLUTIDE (LIR a GLOO tide) is used to help people lose weight and maintain weight loss. It is used with a reduced-calorie diet and exercise. This medicine may be used for other purposes; ask your health care provider or pharmacist if you have questions. COMMON BRAND NAME(S): Saxenda What should I tell my health care provider before I take this medicine? They need to know if you have any of these conditions:  endocrine tumors (MEN 2) or if someone in your family had these tumors  gallbladder disease  high cholesterol  history of alcohol abuse problem  history of pancreatitis  kidney disease or if you are on dialysis  liver disease  previous swelling of the tongue, face, or lips with difficulty breathing, difficulty swallowing, hoarseness, or tightening of the throat  stomach problems  suicidal thoughts, plans, or attempt; a previous suicide attempt by you or a family member  thyroid cancer or if someone in your family had thyroid cancer  an unusual or allergic reaction to liraglutide, other medicines, foods, dyes, or preservatives  pregnant or trying to get pregnant  breast-feeding How should I use this medicine? This medicine is for injection under the skin of your upper leg, stomach area, or upper arm. You will be taught how to prepare and give this medicine. Use exactly as directed. Take your medicine at regular intervals. Do not take it more often than directed. It is important that you put your used needles and syringes in a special sharps container. Do not put them in a trash can. If you do not have a sharps container, call your pharmacist or healthcare provider to get one. A special MedGuide will be given to you by the pharmacist with each prescription and refill. Be sure to read this information carefully each time. Talk to your pediatrician regarding the use of this medicine in children. Special care  may be needed. Overdosage: If you think you have taken too much of this medicine contact a poison control center or emergency room at once. NOTE: This medicine is only for you. Do not share this medicine with others. What if I miss a dose? If you miss a dose, take it as soon as you can. If it is almost time for your next dose, take only that dose. Do not take double or extra doses. If you miss your dose for 3 days or more, call your doctor or health care professional to talk about how to restart this medicine. What may interact with this medicine?  insulin and other medicines for diabetes This list may not describe all possible interactions. Give your health care provider a list of all the medicines, herbs, non-prescription drugs, or dietary supplements you use. Also tell them if you smoke, drink alcohol, or use illegal drugs. Some items may interact with your medicine. What should I watch for while using this medicine? Visit your doctor or health care professional for regular checks on your progress. This medicine is intended to be used in addition to a healthy diet and appropriate exercise. The best results are achieved this way. Do not increase or in any way change your dose without consulting your doctor or health care professional. Drink plenty of fluids while taking this medicine. Check with your doctor or health care professional if you get an attack of severe diarrhea, nausea, and vomiting. The loss of too much body fluid can make it dangerous for you to take this medicine. This medicine may   affect blood sugar levels. If you have diabetes, check with your doctor or health care professional before you change your diet or the dose of your diabetic medicine. Patients and their families should watch out for worsening depression or thoughts of suicide. Also watch out for sudden changes in feelings such as feeling anxious, agitated, panicky, irritable, hostile, aggressive, impulsive, severely  restless, overly excited and hyperactive, or not being able to sleep. If this happens, especially at the beginning of treatment or after a change in dose, call your health care professional. What side effects may I notice from receiving this medicine? Side effects that you should report to your doctor or health care professional as soon as possible:  allergic reactions like skin rash, itching or hives, swelling of the face, lips, or tongue  breathing problems  diarrhea that continues or is severe  lump or swelling on the neck  severe nausea  signs and symptoms of infection like fever or chills; cough; sore throat; pain or trouble passing urine  signs and symptoms of low blood sugar such as feeling anxious, confusion, dizziness, increased hunger, unusually weak or tired, sweating, shakiness, cold, irritable, headache, blurred vision, fast heartbeat, loss of consciousness  signs and symptoms of kidney injury like trouble passing urine or change in the amount of urine  trouble swallowing  unusual stomach upset or pain  vomiting Side effects that usually do not require medical attention (report to your doctor or health care professional if they continue or are bothersome):  constipation  decreased appetite  diarrhea  fatigue  headache  nausea  pain, redness, or irritation at site where injected  stomach upset  stuffy or runny nose This list may not describe all possible side effects. Call your doctor for medical advice about side effects. You may report side effects to FDA at 1-800-FDA-1088. Where should I keep my medicine? Keep out of the reach of children. Store unopened pen in a refrigerator between 2 and 8 degrees C (36 and 46 degrees F). Do not freeze or use if the medicine has been frozen. Protect from light and excessive heat. After you first use the pen, it can be stored at room temperature between 15 and 30 degrees C (59 and 86 degrees F) or in a refrigerator.  Throw away your used pen after 30 days or after the expiration date, whichever comes first. Do not store your pen with the needle attached. If the needle is left on, medicine may leak from the pen. NOTE: This sheet is a summary. It may not cover all possible information. If you have questions about this medicine, talk to your doctor, pharmacist, or health care provider.  2020 Elsevier/Gold Standard (2018-07-08 17:10:35)  

## 2018-12-31 ENCOUNTER — Other Ambulatory Visit: Payer: Self-pay | Admitting: Cardiovascular Disease

## 2019-01-02 ENCOUNTER — Other Ambulatory Visit
Admission: RE | Admit: 2019-01-02 | Discharge: 2019-01-02 | Disposition: A | Discharge status: Home / Self Care | Attending: Physician Assistant | Admitting: Physician Assistant

## 2019-01-02 ENCOUNTER — Other Ambulatory Visit: Payer: Self-pay

## 2019-01-02 DIAGNOSIS — I5032 Chronic diastolic (congestive) heart failure: Secondary | ICD-10-CM | POA: Diagnosis not present

## 2019-01-02 DIAGNOSIS — Z79899 Other long term (current) drug therapy: Secondary | ICD-10-CM | POA: Insufficient documentation

## 2019-01-02 LAB — BASIC METABOLIC PANEL
Anion gap: 11 (ref 5–15)
BUN: 20 mg/dL (ref 8–23)
CO2: 24 mmol/L (ref 22–32)
Calcium: 9.3 mg/dL (ref 8.9–10.3)
Chloride: 104 mmol/L (ref 98–111)
Creatinine, Ser: 1.07 mg/dL (ref 0.61–1.24)
GFR calc Af Amer: >60 mL/min (ref >60)
GFR calc non Af Amer: >60 mL/min (ref >60)
Glucose, Bld: 124 mg/dL — ABNORMAL HIGH (ref 70–99)
Potassium: 3.9 mmol/L (ref 3.5–5.1)
Sodium: 139 mmol/L (ref 135–145)

## 2019-01-03 ENCOUNTER — Telehealth: Payer: Self-pay

## 2019-01-03 ENCOUNTER — Telehealth: Payer: Self-pay | Admitting: Physician Assistant

## 2019-01-03 NOTE — Telephone Encounter (Signed)
-----   Message from Rise Mu, PA-C sent at 01/02/2019 10:54 AM EDT ----- Renal function is improved and back to his baseline. Potassium is improved as well. Continue lower dose torsemide.

## 2019-01-03 NOTE — Telephone Encounter (Signed)
[employer] is calling regarding a PA on pt's Saxenda 18 mg  Ref #  [id number]  [phone]  Thanks Con Memos

## 2019-01-03 NOTE — Telephone Encounter (Signed)
Pt made aware of lab results and Ryan's recommendation. Pt verbalized understanding and voiced appreciation for the call. 

## 2019-01-06 NOTE — Telephone Encounter (Signed)
Pt called and is requesting a call back to get a status update on PA for Liraglutide -Weight Management (SAXENDA) 18 MG/3ML SOPN. Please advise. Thanks TNP

## 2019-01-10 ENCOUNTER — Telehealth: Payer: Self-pay | Admitting: *Deleted

## 2019-01-10 DIAGNOSIS — I5033 Acute on chronic diastolic (congestive) heart failure: Secondary | ICD-10-CM

## 2019-01-10 NOTE — Telephone Encounter (Signed)
-----   Message from Rise Mu, PA-C sent at 01/10/2019  7:21 AM EDT ----- Increase torsemide to 40 mg daily x 5 days then alternate with 40 mg and 20 mg every other day. Recheck bmet in 2 weeks to assess renal function. Would not recommend using metolazone regularly as this has historically lead to AKI in him.  ----- Message ----- From: Valora Corporal, RN Sent: 01/09/2019   5:30 PM EDT To: Rise Mu, PA-C  Reviewed results with patient and he verbalized understanding. Patient states that since decrease in fluid medication he has not been going to the bathroom much and he also has significant swelling in lower legs. Blood pressures are also elevated 180/101. Advised that I would route this back to provider for review. He was appreciative for the call and had no further questions at this time.

## 2019-01-10 NOTE — Telephone Encounter (Signed)
PA done today. Now waiting on insurance to see if it got approved or denied

## 2019-01-10 NOTE — Telephone Encounter (Signed)
Left voicemail message to call back for recommendations.  

## 2019-01-11 ENCOUNTER — Other Ambulatory Visit: Payer: Self-pay | Admitting: Physician Assistant

## 2019-01-11 DIAGNOSIS — K219 Gastro-esophageal reflux disease without esophagitis: Secondary | ICD-10-CM

## 2019-01-11 NOTE — Telephone Encounter (Signed)
Patient returning phone call. Would like to be called back when able.

## 2019-01-11 NOTE — Telephone Encounter (Signed)
Pt made aware of Christell Faith, PA recommendation with verbal understanding. Lab order in Epic for 2 wk bmet. [facility]

## 2019-01-12 NOTE — Telephone Encounter (Signed)
PA for Saxenda was denied and we will try to appeal.Please if patient calls back please let him know this. Thanks.

## 2019-01-12 NOTE — Telephone Encounter (Signed)
Pt called back and I let him know we are asking for an appeal.  Con Memos

## 2019-01-25 ENCOUNTER — Telehealth: Payer: Self-pay

## 2019-01-25 ENCOUNTER — Other Ambulatory Visit
Admission: RE | Admit: 2019-01-25 | Discharge: 2019-01-25 | Disposition: A | Discharge status: Home / Self Care | Source: Ambulatory Visit | Attending: Physician Assistant | Admitting: Physician Assistant

## 2019-01-25 DIAGNOSIS — I5033 Acute on chronic diastolic (congestive) heart failure: Secondary | ICD-10-CM

## 2019-01-25 LAB — BASIC METABOLIC PANEL
Anion gap: 13 (ref 5–15)
BUN: 19 mg/dL (ref 8–23)
CO2: 23 mmol/L (ref 22–32)
Calcium: 9.8 mg/dL (ref 8.9–10.3)
Chloride: 103 mmol/L (ref 98–111)
Creatinine, Ser: 1.24 mg/dL (ref 0.61–1.24)
GFR calc Af Amer: >60 mL/min (ref >60)
GFR calc non Af Amer: >60 mL/min (ref >60)
Glucose, Bld: 148 mg/dL — ABNORMAL HIGH (ref 70–99)
Potassium: 3.9 mmol/L (ref 3.5–5.1)
Sodium: 139 mmol/L (ref 135–145)

## 2019-01-25 NOTE — Telephone Encounter (Signed)
-----   Message from Rise Mu, PA-C sent at 01/25/2019 10:03 AM EDT ----- Renal function is relatively stable.

## 2019-01-25 NOTE — Telephone Encounter (Signed)
Call to patient to review results as he does not have mychart set up.  No new orders at this time.   Advised pt to call for any further questions or concerns.

## 2019-01-25 NOTE — Addendum Note (Signed)
Addended by: Santiago Bur on: 01/25/2019 09:18 AM   Modules accepted: Orders

## 2019-01-28 ENCOUNTER — Other Ambulatory Visit: Payer: Self-pay | Admitting: Cardiovascular Disease

## 2019-01-30 NOTE — Telephone Encounter (Signed)
I spoke with pt this am and he mentioned that he is alternating his furosemide 40 mg qod & 20 mg qod x5 days. Please advise correct instructions and if ok to refill medication.

## 2019-01-30 NOTE — Telephone Encounter (Signed)
Jonathon Faith, PA 01/10/19 recommendation.  Message from Rise Mu, PA-C sent at 01/10/2019  7:21 AM EDT ----- Increase torsemide to 40 mg daily x 5 days then alternate with 40 mg and 20 mg every other day. Recheck bmet in 2 weeks to assess renal function. Would not recommend using metolazone regularly as this has historically lead to AKI in him.   Patients should be alternating 40mg  and 20mg  of torsemide daily.

## 2019-02-21 ENCOUNTER — Other Ambulatory Visit: Payer: Self-pay | Admitting: Physician Assistant

## 2019-02-21 DIAGNOSIS — I25118 Atherosclerotic heart disease of native coronary artery with other forms of angina pectoris: Secondary | ICD-10-CM

## 2019-02-21 DIAGNOSIS — I10 Essential (primary) hypertension: Secondary | ICD-10-CM

## 2019-03-01 ENCOUNTER — Other Ambulatory Visit: Payer: Self-pay | Admitting: Physician Assistant

## 2019-03-10 ENCOUNTER — Emergency Department

## 2019-03-10 ENCOUNTER — Observation Stay
Admission: EM | Admit: 2019-03-10 | Discharge: 2019-03-11 | Disposition: A | Discharge status: Home / Self Care | Attending: Internal Medicine | Admitting: Internal Medicine

## 2019-03-10 ENCOUNTER — Telehealth: Payer: Self-pay | Admitting: Physician Assistant

## 2019-03-10 ENCOUNTER — Other Ambulatory Visit: Payer: Self-pay

## 2019-03-10 DIAGNOSIS — E7800 Pure hypercholesterolemia, unspecified: Secondary | ICD-10-CM | POA: Insufficient documentation

## 2019-03-10 DIAGNOSIS — I25110 Atherosclerotic heart disease of native coronary artery with unstable angina pectoris: Secondary | ICD-10-CM | POA: Diagnosis not present

## 2019-03-10 DIAGNOSIS — Z955 Presence of coronary angioplasty implant and graft: Secondary | ICD-10-CM | POA: Diagnosis not present

## 2019-03-10 DIAGNOSIS — K219 Gastro-esophageal reflux disease without esophagitis: Secondary | ICD-10-CM | POA: Diagnosis not present

## 2019-03-10 DIAGNOSIS — R918 Other nonspecific abnormal finding of lung field: Secondary | ICD-10-CM | POA: Insufficient documentation

## 2019-03-10 DIAGNOSIS — Z79899 Other long term (current) drug therapy: Secondary | ICD-10-CM | POA: Insufficient documentation

## 2019-03-10 DIAGNOSIS — Z20828 Contact with and (suspected) exposure to other viral communicable diseases: Secondary | ICD-10-CM | POA: Insufficient documentation

## 2019-03-10 DIAGNOSIS — I252 Old myocardial infarction: Secondary | ICD-10-CM | POA: Insufficient documentation

## 2019-03-10 DIAGNOSIS — G4733 Obstructive sleep apnea (adult) (pediatric): Secondary | ICD-10-CM | POA: Insufficient documentation

## 2019-03-10 DIAGNOSIS — Z87891 Personal history of nicotine dependence: Secondary | ICD-10-CM | POA: Insufficient documentation

## 2019-03-10 DIAGNOSIS — Z888 Allergy status to other drugs, medicaments and biological substances status: Secondary | ICD-10-CM | POA: Insufficient documentation

## 2019-03-10 DIAGNOSIS — I110 Hypertensive heart disease with heart failure: Secondary | ICD-10-CM | POA: Diagnosis not present

## 2019-03-10 DIAGNOSIS — I5032 Chronic diastolic (congestive) heart failure: Secondary | ICD-10-CM | POA: Diagnosis not present

## 2019-03-10 DIAGNOSIS — Z7982 Long term (current) use of aspirin: Secondary | ICD-10-CM | POA: Diagnosis not present

## 2019-03-10 DIAGNOSIS — Z0184 Encounter for antibody response examination: Secondary | ICD-10-CM | POA: Diagnosis not present

## 2019-03-10 DIAGNOSIS — I200 Unstable angina: Secondary | ICD-10-CM

## 2019-03-10 DIAGNOSIS — Z8249 Family history of ischemic heart disease and other diseases of the circulatory system: Secondary | ICD-10-CM | POA: Insufficient documentation

## 2019-03-10 DIAGNOSIS — Z6836 Body mass index (BMI) 36.0-36.9, adult: Secondary | ICD-10-CM | POA: Diagnosis not present

## 2019-03-10 DIAGNOSIS — R079 Chest pain, unspecified: Secondary | ICD-10-CM | POA: Diagnosis present

## 2019-03-10 DIAGNOSIS — Z951 Presence of aortocoronary bypass graft: Secondary | ICD-10-CM | POA: Diagnosis not present

## 2019-03-10 LAB — CBC
HCT: 41.7 % (ref 39.0–52.0)
HCT: 40 % (ref 39.0–52.0)
Hemoglobin: 15 g/dL (ref 13.0–17.0)
Hemoglobin: 14.2 g/dL (ref 13.0–17.0)
MCH: 31.4 pg (ref 26.0–34.0)
MCH: 31.1 pg (ref 26.0–34.0)
MCHC: 36 g/dL (ref 30.0–36.0)
MCHC: 35.5 g/dL (ref 30.0–36.0)
MCV: 87.2 fL (ref 80.0–100.0)
MCV: 87.7 fL (ref 80.0–100.0)
Platelets: 211 10*3/uL (ref 150–400)
Platelets: 204 10*3/uL (ref 150–400)
RBC: 4.78 MIL/uL (ref 4.22–5.81)
RBC: 4.56 MIL/uL (ref 4.22–5.81)
RDW: 13.4 % (ref 11.5–15.5)
RDW: 13.4 % (ref 11.5–15.5)
WBC: 6.7 10*3/uL (ref 4.0–10.5)
WBC: 7.4 10*3/uL (ref 4.0–10.5)
nRBC: 0 % (ref 0.0–0.2)
nRBC: 0 % (ref 0.0–0.2)

## 2019-03-10 LAB — TROPONIN I (HIGH SENSITIVITY)
Troponin I (High Sensitivity): 12 ng/L (ref <18)
Troponin I (High Sensitivity): 12 ng/L (ref <18)
Troponin I (High Sensitivity): 10 ng/L (ref <18)
Troponin I (High Sensitivity): 10 ng/L (ref <18)

## 2019-03-10 LAB — COMPREHENSIVE METABOLIC PANEL
ALT: 68 U/L — ABNORMAL HIGH (ref 0–44)
AST: 47 U/L — ABNORMAL HIGH (ref 15–41)
Albumin: 4.2 g/dL (ref 3.5–5.0)
Alkaline Phosphatase: 59 U/L (ref 38–126)
Anion gap: 12 (ref 5–15)
BUN: 22 mg/dL (ref 8–23)
CO2: 24 mmol/L (ref 22–32)
Calcium: 9.3 mg/dL (ref 8.9–10.3)
Chloride: 102 mmol/L (ref 98–111)
Creatinine, Ser: 1.19 mg/dL (ref 0.61–1.24)
GFR calc Af Amer: >60 mL/min (ref >60)
GFR calc non Af Amer: >60 mL/min (ref >60)
Glucose, Bld: 187 mg/dL — ABNORMAL HIGH (ref 70–99)
Potassium: 3.5 mmol/L (ref 3.5–5.1)
Sodium: 138 mmol/L (ref 135–145)
Total Bilirubin: 0.8 mg/dL (ref 0.3–1.2)
Total Protein: 7.5 g/dL (ref 6.5–8.1)

## 2019-03-10 LAB — SARS CORONAVIRUS 2 BY RT PCR (HOSPITAL ORDER, PERFORMED IN ~~LOC~~ HOSPITAL LAB): SARS Coronavirus 2: NEGATIVE

## 2019-03-10 LAB — CREATININE, SERUM
Creatinine, Ser: 1.12 mg/dL (ref 0.61–1.24)
GFR calc Af Amer: >60 mL/min (ref >60)
GFR calc non Af Amer: >60 mL/min (ref >60)

## 2019-03-10 MED ORDER — ONDANSETRON HCL 4 MG PO TABS: 4.0000 mg | ORAL_TABLET | Freq: Four times a day (QID) | ORAL | Status: DC | PRN

## 2019-03-10 MED ORDER — ENOXAPARIN SODIUM 40 MG/0.4ML ~~LOC~~ SOLN
40.0000 mg | SUBCUTANEOUS | Status: DC
  Administered 2019-03-10: 40 mg via SUBCUTANEOUS
  Filled 2019-03-10: qty 0.4

## 2019-03-10 MED ORDER — ASPIRIN 81 MG PO CHEW
324.0000 mg | CHEWABLE_TABLET | Freq: Once | ORAL | Status: AC
  Administered 2019-03-10: 324 mg via ORAL
  Filled 2019-03-10: qty 4

## 2019-03-10 MED ORDER — AMLODIPINE BESYLATE 5 MG PO TABS
5.0000 mg | ORAL_TABLET | Freq: Every day | ORAL | Status: DC
  Administered 2019-03-11: 5 mg via ORAL
  Filled 2019-03-10: qty 1

## 2019-03-10 MED ORDER — LOSARTAN POTASSIUM 50 MG PO TABS
100.0000 mg | ORAL_TABLET | Freq: Every day | ORAL | Status: DC
  Administered 2019-03-11: 100 mg via ORAL
  Filled 2019-03-10: qty 2

## 2019-03-10 MED ORDER — ACETAMINOPHEN 325 MG PO TABS
650.0000 mg | ORAL_TABLET | Freq: Four times a day (QID) | ORAL | Status: DC | PRN
  Administered 2019-03-10 (×2): 650 mg via ORAL
  Filled 2019-03-10 (×2): qty 2

## 2019-03-10 MED ORDER — ATORVASTATIN CALCIUM 20 MG PO TABS
40.0000 mg | ORAL_TABLET | Freq: Every evening | ORAL | Status: DC
  Administered 2019-03-10: 40 mg via ORAL
  Filled 2019-03-10: qty 2

## 2019-03-10 MED ORDER — ONDANSETRON HCL 4 MG/2ML IJ SOLN: 4.0000 mg | Freq: Four times a day (QID) | INTRAMUSCULAR | Status: DC | PRN

## 2019-03-10 MED ORDER — ACETAMINOPHEN 650 MG RE SUPP: 650.0000 mg | Freq: Four times a day (QID) | RECTAL | Status: DC | PRN

## 2019-03-10 MED ORDER — ISOSORBIDE MONONITRATE ER 60 MG PO TB24
60.0000 mg | ORAL_TABLET | Freq: Every day | ORAL | Status: DC
  Administered 2019-03-10 – 2019-03-11 (×2): 60 mg via ORAL
  Filled 2019-03-10 (×2): qty 1

## 2019-03-10 MED ORDER — ASPIRIN EC 81 MG PO TBEC
81.0000 mg | DELAYED_RELEASE_TABLET | Freq: Every day | ORAL | Status: DC
  Administered 2019-03-11: 81 mg via ORAL
  Filled 2019-03-10: qty 1

## 2019-03-10 MED ORDER — PANTOPRAZOLE SODIUM 40 MG PO TBEC
40.0000 mg | DELAYED_RELEASE_TABLET | Freq: Two times a day (BID) | ORAL | Status: DC
  Administered 2019-03-10 – 2019-03-11 (×2): 40 mg via ORAL
  Filled 2019-03-10 (×2): qty 1

## 2019-03-10 MED ORDER — BISACODYL 5 MG PO TBEC: 5.0000 mg | DELAYED_RELEASE_TABLET | Freq: Every day | ORAL | Status: DC | PRN

## 2019-03-10 MED ORDER — CARVEDILOL 25 MG PO TABS
25.0000 mg | ORAL_TABLET | Freq: Two times a day (BID) | ORAL | Status: DC
  Administered 2019-03-10 – 2019-03-11 (×2): 25 mg via ORAL
  Filled 2019-03-10 (×2): qty 1

## 2019-03-10 MED ORDER — ADULT MULTIVITAMIN W/MINERALS CH
1.0000 | ORAL_TABLET | Freq: Every day | ORAL | Status: DC
  Administered 2019-03-11: 1 via ORAL
  Filled 2019-03-10: qty 1

## 2019-03-10 NOTE — ED Provider Notes (Signed)
[facility] Emergency Department Provider Note       Time seen: ----------------------------------------- 9:56 AM on 03/10/2019 -----------------------------------------   I have reviewed the triage vital signs and the nursing notes.  HISTORY   Chief Complaint Chest Pain    HPI Jonathon Newman is a 63 y.o. male with a history of arthritis, CHF, coronary artery disease, dyspnea, GERD, hypertension, hyperlipidemia, coronary artery bypass grafting x2 who presents to the ED for chest pressure that began yesterday.  He has had some intermittent pain for the past 4 days.  He has not had an increase in his shortness of breath.  Patient has had some pain in his left arm and left jaw.  Discomfort is 6 out of 10.  Past Medical History:  Diagnosis Date  . Arthritis   . CHF (congestive heart failure) (HCC)   . Coronary artery disease    PCI and Cypher drug-eluting stent placement to the right coronary artery in 2010.  NSTEMI in 2016.  Cardiac catheterization showed patent RCA stent, significant distal left main disease with an FFR ratio of 0.76 and ostial left circumflex stenosis.  Underwent CABG with LIMA to LAD and SVG to OM 2.  . Dyspnea   . GERD (gastroesophageal reflux disease)   . Hypercholesteremia   . Hypertension   . S/P coronary artery bypass graft x 2   . Sleep apnea    uses CPAP    Patient Active Problem List   Diagnosis Date Noted  . Unstable angina (HCC) 04/25/2018  . SOB (shortness of breath) 04/13/2018  . Dizziness 04/13/2018  . S/P coronary artery bypass graft x 2   . Incomplete bladder emptying 07/21/2017  . Low back pain 07/09/2017  . Lumbar radiculopathy 07/09/2017  . Gastroesophageal reflux disease with esophagitis 07/05/2017  . Congestive heart failure (HCC) 07/05/2017  . Benign prostatic hyperplasia with weak urinary stream 07/05/2017  . Dysphagia 07/05/2017  . DDD (degenerative disc disease), lumbar 07/05/2017  . Coronary artery  disease involving native coronary artery of native heart with angina pectoris (HCC) 07/31/2015  . Benign essential HTN 07/31/2015  . Hyperlipidemia 07/31/2015  . OSA (obstructive sleep apnea) 07/31/2015    Past Surgical History:  Procedure Laterality Date  . APPENDECTOMY    . BACK SURGERY  11/01/2017   SL 5 and S1  . CARDIAC CATHETERIZATION    . CARPAL TUNNEL RELEASE     left hand  . CHOLECYSTECTOMY    . CHOLECYSTECTOMY    . CORONARY ARTERY BYPASS GRAFT  04/21/2015   CABG x 2 [place] [facility] LIMA to LAD amd SVG to OM2  . CORONARY STENT PLACEMENT  2010   Cordis Cypher Sirolimus-eluting stent 2.50 mm x 26 [facility]   . ESOPHAGEAL MANOMETRY N/A 08/04/2017   Procedure: ESOPHAGEAL MANOMETRY (EM);  Surgeon: Toney ReilVanga, Rohini Reddy, MD;  Location: [facility] ENDOSCOPY;  Service: Endoscopy;  Laterality: N/A;  . FRACTURE SURGERY    . KNEE SURGERY    . KNEE SURGERY     right knee   . LAMINECTOMY     "plate in neck Z6-X0C3-C4"  . LEFT HEART CATH AND CORS/GRAFTS ANGIOGRAPHY N/A 05/02/2018   Procedure: CORS/GRAFTS ANGIOGRAPHY;  Surgeon: Iran OuchArida, Muhammad A, MD;  Location: [facility] INVASIVE CV LAB;  Service: Cardiovascular;  Laterality: N/A;  . RIGHT/LEFT HEART CATH AND CORONARY ANGIOGRAPHY Bilateral 05/02/2018   Procedure: LEFT HEART CATH;  Surgeon: Iran OuchArida, Muhammad A, MD;  Location: [facility] INVASIVE CV LAB;  Service: Cardiovascular;  Laterality: Bilateral;  . VASECTOMY      Allergies Lisinopril  Social History Social History   Tobacco Use  . Smoking status: Former Smoker    Packs/day: 0.25    Years: 2.00    Pack years: 0.50    Types: Cigarettes    Quit date: 05/26/2005    Years since quitting: 13.7  . Smokeless tobacco: Former Neurosurgeon    Types: Chew  Substance Use Topics  . Alcohol use: No  . Drug use: No   Review of Systems Constitutional: Negative for fever. Cardiovascular: Positive for chest pain Respiratory: Negative for shortness of  breath. Gastrointestinal: Negative for abdominal pain, vomiting and diarrhea. Musculoskeletal: Negative for back pain. Skin: Negative for rash. Neurological: Negative for headaches, focal weakness or numbness.  All systems negative/normal/unremarkable except as stated in the HPI  ____________________________________________   PHYSICAL EXAM:  VITAL SIGNS: ED Triage Vitals  Enc Vitals Group     BP 03/10/19 0936 131/70     Pulse Rate 03/10/19 0936 71     Resp 03/10/19 0936 18     Temp 03/10/19 0936 98.4 F (36.9 C)     Temp Source 03/10/19 0936 Oral     SpO2 03/10/19 0936 96 %     Weight 03/10/19 0937 235 lb (106.6 kg)     Height 03/10/19 0937 5\' 10"  (1.778 m)     Head Circumference --      Peak Flow --      Pain Score 03/10/19 0937 6     Pain Loc --      Pain Edu? --      Excl. in GC? --    Constitutional: Alert and oriented. Well appearing and in no distress. Eyes: Conjunctivae are normal. Normal extraocular movements. Cardiovascular: Normal rate, regular rhythm. No murmurs, rubs, or gallops. Respiratory: Normal respiratory effort without tachypnea nor retractions. Breath sounds are clear and equal bilaterally. No wheezes/rales/rhonchi. Gastrointestinal: Soft and nontender. Normal bowel sounds Musculoskeletal: Nontender with normal range of motion in extremities. No lower extremity tenderness nor edema. Neurologic:  Normal speech and language. No gross focal neurologic deficits are appreciated.  Skin:  Skin is warm, dry and intact. No rash noted. Psychiatric: Mood and affect are normal. Speech and behavior are normal.  ____________________________________________  EKG: Interpreted by me.  Sinus rhythm with a rate of 72 bpm, normal PR interval, normal QRS, normal QT  ____________________________________________  ED COURSE:  As part of my medical decision making, I reviewed the following data within the electronic MEDICAL RECORD NUMBER History obtained from family if  available, nursing notes, old chart and ekg, as well as notes from prior ED visits. Patient presented for chest pain, we will assess with labs and imaging as indicated at this time.   Procedures  Jonathon Newman was evaluated in Emergency Department on 03/10/2019 for the symptoms described in the history of present illness. He was evaluated in the context of the global COVID-19 pandemic, which necessitated consideration that the patient might be at risk for infection with the SARS-CoV-2 virus that causes COVID-19. Institutional protocols and algorithms that pertain to the evaluation of patients at risk for COVID-19 are in a state of rapid change based on information released by regulatory bodies including the CDC and federal and state organizations. These policies and algorithms were followed during the patient's care in the ED.  ____________________________________________   LABS (pertinent positives/negatives)  Labs Reviewed  COMPREHENSIVE METABOLIC PANEL - Abnormal; Notable for the following components:  Result Value   Glucose, Bld 187 (*)    AST 47 (*)    ALT 68 (*)    All other components within normal limits  CBC  TROPONIN I (HIGH SENSITIVITY)    RADIOLOGY Images were viewed by me  Chest x-ray IMPRESSION:  5 mm nodular opacity in the periphery of the left mid lower lung  region, not present on previous study. This finding warrants  noncontrast enhanced chest CT to further assess.   No edema or consolidation.   Stable cardiac silhouette. Areas of postoperative change noted. Note  fractures in several superior sternal wires.  ____________________________________________   DIFFERENTIAL DIAGNOSIS   Unstable angina, MI, PE, dissection, musculoskeletal pain, GERD  FINAL ASSESSMENT AND PLAN  Chest pain   Plan: The patient had presented for chest pain which resembles unstable angina. Patient's labs did not reveal any acute process. Patient's imaging did reveal a nodular  opacity in the left mid lower lung which will need to be CT at some point.  Patient symptoms resemble unstable angina.  He would benefit from hospital observation and cardiac rule out.   Laurence Aly, MD    Note: This note was generated in part or whole with voice recognition software. Voice recognition is usually quite accurate but there are transcription errors that can and very often do occur. I apologize for any typographical errors that were not detected and corrected.     Earleen Newport, MD 03/10/19 1040

## 2019-03-10 NOTE — H&P (Signed)
[facility] [place] [facility]   PATIENT NAME: Jonathon Newman    MR#:  [id number]  DATE OF BIRTH:  [birth year]  DATE OF ADMISSION:  03/10/2019  PRIMARY CARE PHYSICIAN: Margaretann Loveless, PA-C   REQUESTING/REFERRING PHYSICIAN: Dr. Daryel November  CHIEF COMPLAINT: Chest pain   Chief Complaint  Patient presents with  . Chest Pain    HISTORY OF PRESENT ILLNESS:  Jonathon Newman  is a 63 y.o. male with a known history of essential hypertension, CAD, CABG, GERD comes in because of left-sided chest pressure that is going on for last 3 days, patient told me that his blood pressure has been running more than 200 systolic for the past 2 days.  Today she he called [facility], advised him to go to ER.  Patient EKG, high since her troponins are so far normal.  Patient still complains of chest pressure and he says that every time present changes he feels like chest discomfort since he had CABG.  Denies any nausea vomiting.  No sweating.  No recent fever.  COVID-19 test is ordered as asymptomatic steering protocol.  Received aspirin in the emergency room, patient blood pressure is normal.  Patient complains of discomfort in the chest 6 out of 10.  Has some numbness in the left arm.  No radiation of chest pain to the left arm or jaw.  Shortness of breath.  PAST MEDICAL HISTORY:   Past Medical History:  Diagnosis Date  . Arthritis   . CHF (congestive heart failure) (HCC)   . Coronary artery disease    PCI and Cypher drug-eluting stent placement to the right coronary artery in 2010.  NSTEMI in 2016.  Cardiac catheterization showed patent RCA stent, significant distal left main disease with an FFR ratio of 0.76 and ostial left circumflex stenosis.  Underwent CABG with LIMA to LAD and SVG to OM 2.  . Dyspnea   . GERD (gastroesophageal reflux disease)   . Hypercholesteremia   . Hypertension   . S/P coronary artery bypass graft x 2   . Sleep apnea    uses CPAP     PAST SURGICAL HISTOIRY:   Past Surgical History:  Procedure Laterality Date  . APPENDECTOMY    . BACK SURGERY  11/01/2017   SL 5 and S1  . CARDIAC CATHETERIZATION    . CARPAL TUNNEL RELEASE     left hand  . CHOLECYSTECTOMY    . CHOLECYSTECTOMY    . CORONARY ARTERY BYPASS GRAFT  04/21/2015   CABG x 2 [facility] LIMA to LAD amd SVG to OM2  . CORONARY STENT PLACEMENT  2010   Cordis Cypher Sirolimus-eluting stent 2.50 mm x 26 mm placed to the RCA at [facility]   . ESOPHAGEAL MANOMETRY N/A 08/04/2017   Procedure: ESOPHAGEAL MANOMETRY (EM);  Surgeon: Toney Reil, MD;  Location: [facility] ENDOSCOPY;  Service: Endoscopy;  Laterality: N/A;  . FRACTURE SURGERY    . KNEE SURGERY    . KNEE SURGERY     right knee   . LAMINECTOMY     "plate in neck X3-K4"  . LEFT HEART CATH AND CORS/GRAFTS ANGIOGRAPHY N/A 05/02/2018   Procedure: CORS/GRAFTS ANGIOGRAPHY;  Surgeon: Iran Ouch, MD;  Location: [facility] INVASIVE CV LAB;  Service: Cardiovascular;  Laterality: N/A;  . RIGHT/LEFT HEART CATH AND CORONARY ANGIOGRAPHY Bilateral 05/02/2018   Procedure: LEFT HEART CATH;  Surgeon: Iran Ouch, MD;  Location: [facility] INVASIVE CV LAB;  Service:  Cardiovascular;  Laterality: Bilateral;  . VASECTOMY      SOCIAL HISTORY:   Social History   Tobacco Use  . Smoking status: Former Smoker    Packs/day: 0.25    Years: 2.00    Pack years: 0.50    Types: Cigarettes    Quit date: 05/26/2005    Years since quitting: 13.7  . Smokeless tobacco: Former Neurosurgeon    Types: Chew  Substance Use Topics  . Alcohol use: No    FAMILY HISTORY:   Family History  Problem Relation Age of Onset  . Arthritis Mother   . Hyperlipidemia Father   . Hypertension Father   . Heart attack Father 10  . Heart murmur Brother   . Valvular heart disease Brother   . Cataracts Maternal Grandmother   . Glaucoma Maternal Grandmother   . Cancer Maternal Grandfather   . Heart attack Paternal  Grandfather   . Hypertension Paternal Grandfather   . Prostate cancer Neg Hx   . Bladder Cancer Neg Hx   . Kidney cancer Neg Hx     DRUG ALLERGIES:   Allergies  Allergen Reactions  . Lisinopril Cough    REVIEW OF SYSTEMS:  CONSTITUTIONAL: No fever, fatigue or weakness.  Says that he always has normal EKG and normal troponins but when he had a CABG the test were normal but his cardiac cath was abnormal and he is concerned about same problem that is happening now. EYES: No blurred or double vision.  EARS, NOSE, AND THROAT: No tinnitus or ear pain.  RESPIRATORY: No cough, shortness of breath, wheezing or hemoptysis.  CARDIOVASCULAR: No chest pain, orthopnea, edema.  GASTROINTESTINAL: No nausea, vomiting, diarrhea or abdominal pain.  GENITOURINARY: No dysuria, hematuria.  ENDOCRINE: No polyuria, nocturia,  HEMATOLOGY: No anemia, easy bruising or bleeding SKIN: No rash or lesion. MUSCULOSKELETAL: No joint pain or arthritis.   NEUROLOGIC: No tingling, numbness, weakness.  PSYCHIATRY: No anxiety or depression.   MEDICATIONS AT HOME:   Prior to Admission medications   Medication Sig Start Date End Date Taking? Authorizing Provider  amLODipine (NORVASC) 5 MG tablet Take 1 tablet (5 mg total) by mouth 2 (two) times a day. 12/15/18  Yes Dunn, Raymon Mutton, PA-C  aspirin 81 MG tablet Take 81 mg by mouth daily.   Yes [provider]  atorvastatin (LIPITOR) 40 MG tablet TAKE ONE TABLET BY MOUTH DAILY 03/01/19  Yes Dunn, Raymon Mutton, PA-C  carvedilol (COREG) 25 MG tablet TAKE ONE TABLET BY MOUTH TWICE A DAY WITH MEALS 11/18/18  Yes Joycelyn Man M, PA-C  isosorbide mononitrate (IMDUR) 60 MG 24 hr tablet Take 1 tablet (60 mg total) by mouth daily. 11/08/18  Yes Antonieta Iba, MD  losartan (COZAAR) 100 MG tablet TAKE ONE TABLET BY MOUTH DAILY 02/21/19  Yes Margaretann Loveless, PA-C  Multiple Vitamin (MULTIVITAMIN WITH MINERALS) TABS tablet Take 1 tablet by mouth daily.   Yes [provider]  pantoprazole (PROTONIX) 20 MG tablet TAKE TWO TABLETS BY MOUTH TWICE A DAY 01/11/19  Yes Joycelyn Man M, PA-C  potassium chloride (K-DUR) 10 MEQ tablet Take 1 tablet (10 mEq total) by mouth 2 (two) times daily as needed. Patient taking differently: Take 10 mEq by mouth See admin instructions. Take one every AM. Take two with every dose of metolazone. 04/13/18  Yes Gollan, Tollie Pizza, MD  Sennosides (SENNA-EX PO) Take 1 tablet by mouth 2 (two) times daily.    Yes [provider]  torsemide [facility])  20 MG tablet Take 40 mg qd alternating with 20mg  every other day as directed. 01/30/19  Yes Dunn, Areta Haber, PA-C  isosorbide mononitrate (IMDUR) 60 MG 24 hr tablet Take 1 tablet (60 mg total) by mouth daily. 05/18/18 08/16/18  Rise Mu, PA-C  Liraglutide -Weight Management (SAXENDA) 18 MG/3ML SOPN Inject 0.6 mg into the skin daily. Increase by 0.6mg  weekly until max dose of 3mg  achieved Patient not taking: Reported on 03/10/2019 12/22/18   Mar Daring, PA-C  naproxen sodium (ALEVE) 220 MG tablet Take 440 mg by mouth 2 (two) times daily as needed (mild pain).    [provider]  sildenafil (VIAGRA) 100 MG tablet Take 100 mg by mouth daily as needed for erectile dysfunction.    [provider]      VITAL SIGNS:  Blood pressure 135/88, pulse 67, temperature 98.4 F (36.9 C), temperature source Oral, resp. rate (!) 22, height 5\' 10"  (1.778 m), weight 106.6 kg, SpO2 94 %.  PHYSICAL EXAMINATION:  GENERAL:  63 y.o.-year-old patient lying in the bed with no acute distress.  EYES: Pupils equal, round, reactive to light and accommodation. No scleral icterus. Extraocular muscles intact.  HEENT: Head atraumatic, normocephalic. Oropharynx and nasopharynx clear.  NECK:  Supple, no jugular venous distention. No thyroid enlargement, no tenderness.  LUNGS: Normal breath sounds bilaterally, no wheezing, rales,rhonchi or crepitation. No use of accessory muscles of  respiration.  CARDIOVASCULAR: S1, S2 normal. No murmurs, rubs, or gallops.  ABDOMEN: Soft, nontender, nondistended. Bowel sounds present. No organomegaly or mass.  EXTREMITIES: No pedal edema, cyanosis, or clubbing.  NEUROLOGIC: Cranial nerves II through XII are intact. Muscle strength 5/5 in all extremities. Sensation intact. Gait not checked.  PSYCHIATRIC: The patient is alert and oriented x 3.  SKIN: No obvious rash, lesion, or ulcer.   LABORATORY PANEL:   CBC Recent Labs  Lab 03/10/19 0953  WBC 6.7  HGB 15.0  HCT 41.7  PLT 211   ------------------------------------------------------------------------------------------------------------------  Chemistries  Recent Labs  Lab 03/10/19 0953  NA 138  K 3.5  CL 102  CO2 24  GLUCOSE 187*  BUN 22  CREATININE 1.19  CALCIUM 9.3  AST 47*  ALT 68*  ALKPHOS 59  BILITOT 0.8   ------------------------------------------------------------------------------------------------------------------  Cardiac Enzymes No results for input(s): TROPONINI in the last 168 hours. ------------------------------------------------------------------------------------------------------------------  RADIOLOGY:  Dg Chest Port 1 View  Result Date: 03/10/2019 CLINICAL DATA:  Chest EXAM: PORTABLE CHEST 1 VIEW COMPARISON:  August 26, 2015 FINDINGS: No edema or consolidation. There is a 5 mm nodular opacity in the periphery of the left mid to lower lung region which was not present on prior study. Heart size and pulmonary vascular normal. Patient is status post coronary artery bypass grafting. Several sternal wires superiorly show evidence of fracture, a new finding compared to the August 26, 2015. No adenopathy. There is postoperative change in the lower cervical region. IMPRESSION: 5 mm nodular opacity in the periphery of the left mid lower lung region, not present on previous study. This finding warrants noncontrast enhanced chest CT to further assess. No  edema or consolidation. Stable cardiac silhouette. Areas of postoperative change noted. Note fractures in several superior sternal wires. Electronically Signed   By: Lowella Grip III M.D.   On: 03/10/2019 10:30    EKG:   Orders placed or performed during the hospital encounter of 03/10/19  . EKG 12-Lead  . EKG 12-Lead  . ED EKG  . ED EKG  Normal sinus  rhythm beats per minute with no ST-T changes.  IMPRESSION AND PLAN:   63 year old male with history of CAD, CABG in 2017, GERD, essential hypertension, morbid obesity, comes in because of left-sided chest pain/chest pressure with normal EKG, normal high-sensitivity troponins. 1.  Left-sided chest pressure likely due to musculoskeletal in origin, due to history of CAD and CABG admitted to observation status, check serial high-sensitivity troponins, obtain cardiology consult with Dr. Mariah MillingGollan, epic texted message to him.  Continue aspirin, beta-blockers, statins, nitrates, losartan. 2.  History of CAD, CABG, chest x-ray showed with evidence of fracture, patient has no symptoms, appreciate cardiology following, [facility]. 3.  5 mm nodular opacity in the periphery of the left lung, patient needs c noncontrast CT chest for further evaluation. #4 GERD: Continue PPIs      All the records are reviewed and case discussed with ED provider. Management plans discussed with the patient, family and they are in agreement.  CODE STATUS: Full code  TOTAL TIME TAKING CARE OF THIS PATIENT: 50 minutes.    Katha HammingSnehalatha Ellyce Lafevers M.D on 03/10/2019 at 11:23 AM  Between 7am to 6pm - Pager - [phone]  After 6pm go to [email or web address] - password [email or web address] [facility]  [facility]  Office  [phone]  CC: Primary care physician; Margaretann LovelessBurnette, [name], PA-C  Note: This dictation was prepared with Dragon dictation along with smaller phrase technology. Any transcriptional errors that result from this process are  unintentional.

## 2019-03-10 NOTE — ED Notes (Signed)
X-ray at bedside

## 2019-03-10 NOTE — ED Notes (Signed)
Patient given remote control

## 2019-03-10 NOTE — ED Notes (Signed)
This tech gave pt a box meal lunch.

## 2019-03-10 NOTE — Telephone Encounter (Signed)
Pt reports HTN and constant L anterior chest "discomfort" with L arm and jaw numbness.   9/24 4:15 PM 212/105 9/24 7 PM 185/94 9/25 198/100, pulse 65 1 HR after med  Pt reports that he has chest discomfort every time the weather changes ever since CABG 2016.   Pt denies SOB, no sweating or recent illness.  Pt reports compliance with medications.   I referred the patient to the ED for further workup of active chest discomfort and HTN given significant cardiac hx. Pt agrees to seek urgent medical care at the ED at [facility].

## 2019-03-10 NOTE — ED Notes (Signed)
Pt given coke and ice after given the okay by the rounding doctor.

## 2019-03-10 NOTE — ED Triage Notes (Signed)
Reports chest pressure that began yesterday, no increase in SOB-states SOB at baseline. Pt does not appear SOB at this time. RR even and unlabored, color WNL. Reports this chest pressure is recurrent, "when weather changes". Reports "twinge to left arm and left jaw".

## 2019-03-10 NOTE — Telephone Encounter (Signed)
Pt c/o BP issue: STAT if pt c/o blurred vision, one-sided weakness or slurred speech  1. What are your last 5 BP readings?  Yesterday afternoon 212/102 Later in the evening 185/94 This morning 198/100  2. Are you having any other symptoms (ex. Dizziness, headache, blurred vision, passed out)? Nausea, chest tightness, numbness in left arm  3. What is your BP issue? Elevated  HR 65

## 2019-03-11 DIAGNOSIS — E669 Obesity, unspecified: Secondary | ICD-10-CM

## 2019-03-11 DIAGNOSIS — I10 Essential (primary) hypertension: Secondary | ICD-10-CM | POA: Diagnosis not present

## 2019-03-11 DIAGNOSIS — R079 Chest pain, unspecified: Secondary | ICD-10-CM | POA: Diagnosis not present

## 2019-03-11 LAB — BASIC METABOLIC PANEL
Anion gap: 14 (ref 5–15)
BUN: 21 mg/dL (ref 8–23)
CO2: 24 mmol/L (ref 22–32)
Calcium: 8.9 mg/dL (ref 8.9–10.3)
Chloride: 100 mmol/L (ref 98–111)
Creatinine, Ser: 1.11 mg/dL (ref 0.61–1.24)
GFR calc Af Amer: >60 mL/min (ref >60)
GFR calc non Af Amer: >60 mL/min (ref >60)
Glucose, Bld: 131 mg/dL — ABNORMAL HIGH (ref 70–99)
Potassium: 3.4 mmol/L — ABNORMAL LOW (ref 3.5–5.1)
Sodium: 138 mmol/L (ref 135–145)

## 2019-03-11 LAB — CBC
HCT: 37.6 % — ABNORMAL LOW (ref 39.0–52.0)
Hemoglobin: 13 g/dL (ref 13.0–17.0)
MCH: 30.8 pg (ref 26.0–34.0)
MCHC: 34.6 g/dL (ref 30.0–36.0)
MCV: 89.1 fL (ref 80.0–100.0)
Platelets: 164 10*3/uL (ref 150–400)
RBC: 4.22 MIL/uL (ref 4.22–5.81)
RDW: 13.3 % (ref 11.5–15.5)
WBC: 6.5 10*3/uL (ref 4.0–10.5)
nRBC: 0 % (ref 0.0–0.2)

## 2019-03-11 LAB — HIV ANTIBODY (ROUTINE TESTING W REFLEX): HIV Screen 4th Generation wRfx: NONREACTIVE

## 2019-03-11 LAB — GLUCOSE, CAPILLARY: Glucose-Capillary: 132 mg/dL — ABNORMAL HIGH (ref 70–99)

## 2019-03-11 MED ORDER — HYDRALAZINE HCL 25 MG PO TABS
25.0000 mg | ORAL_TABLET | Freq: Two times a day (BID) | ORAL | Status: DC
  Administered 2019-03-11: 25 mg via ORAL
  Filled 2019-03-11: qty 1

## 2019-03-11 MED ORDER — HYDRALAZINE HCL 25 MG PO TABS: 25.0000 mg | ORAL_TABLET | Freq: Two times a day (BID) | ORAL | 0 refills | Status: DC

## 2019-03-11 NOTE — H&P (Addendum)
Cardiology History and Physical:   Patient ID: Jonathon Newman MRN: [id number]; DOB: 01-31-1956   Admission date: 03/10/2019  Primary Care Provider: Margaretann Loveless, PA-C Primary Cardiologist: [facility]- Eula Listen, PA-C Primary Electrophysiologist:  None   Chief Complaint: Chest tightness  Patient Profile:   Jonathon Newman is a 63 y.o. male with history of hypertension, hyperlipidemia, sleep apnea on CPAP, CAD status post CABG x2 who presents to the hospital due to chest tightness and elevated blood pressures  History of Present Illness:   Jonathon Newman is 63 year old male with history of hypertension, HFpEF, OSA on CPAP, CAD/CABG with a LIMA to LAD and SVG to OM 2 in 2016 who presents due to chest tightness.  Patient is a Human resources officer.  He was at work when symptoms began 2 days ago.  He described a sensation of squeezing in his chest while at work, sitting down.  A coworker saw him and told him he did not look well.  Patient then left and went home, checked his blood pressure and noted it was elevated with systolic in the 200s.  He then tried to relax and recheck his blood pressure which he noted was in the 170s.  His symptoms resolved with the improvement in blood pressure.  The next day(yesterday), patient called the office and reported symptoms, he was advised to come to the ED.  He states feeling okay since being admitted to the hospital.  Cardiac enzymes were within normal limits on admission.  He takes his medication as prescribed.  He checks his blood pressure twice daily about an hour after taking his medications in the morning and also in the evening.  Heart Pathway Score:     Past Medical History:  Diagnosis Date  . Arthritis   . CHF (congestive heart failure) (HCC)   . Coronary artery disease    PCI and Cypher drug-eluting stent placement to the right coronary artery in 2010.  NSTEMI in 2016.  Cardiac catheterization showed patent RCA stent, significant distal left main  disease with an FFR ratio of 0.76 and ostial left circumflex stenosis.  Underwent CABG with LIMA to LAD and SVG to OM 2.  . Dyspnea   . GERD (gastroesophageal reflux disease)   . Hypercholesteremia   . Hypertension   . S/P coronary artery bypass graft x 2   . Sleep apnea    uses CPAP    Past Surgical History:  Procedure Laterality Date  . APPENDECTOMY    . BACK SURGERY  11/01/2017   SL 5 and S1  . CARDIAC CATHETERIZATION    . CARPAL TUNNEL RELEASE     left hand  . CHOLECYSTECTOMY    . CHOLECYSTECTOMY    . CORONARY ARTERY BYPASS GRAFT  04/21/2015   CABG x 2 [place] [facility] LIMA to LAD amd SVG to OM2  . CORONARY STENT PLACEMENT  2010   Cordis Cypher Sirolimus-eluting stent 2.50 mm x 26 mm placed to the RCA at [facility]   . ESOPHAGEAL MANOMETRY N/A 08/04/2017   Procedure: ESOPHAGEAL MANOMETRY (EM);  Surgeon: Toney Reil, MD;  Location: [facility] ENDOSCOPY;  Service: Endoscopy;  Laterality: N/A;  . FRACTURE SURGERY    . KNEE SURGERY    . KNEE SURGERY     right knee   . LAMINECTOMY     "plate in neck H6-D1"  . LEFT HEART CATH AND CORS/GRAFTS ANGIOGRAPHY N/A 05/02/2018   Procedure: CORS/GRAFTS ANGIOGRAPHY;  Surgeon: Iran Ouch, MD;  Location: [facility] INVASIVE  CV LAB;  Service: Cardiovascular;  Laterality: N/A;  . RIGHT/LEFT HEART CATH AND CORONARY ANGIOGRAPHY Bilateral 05/02/2018   Procedure: LEFT HEART CATH;  Surgeon: Iran OuchArida, Muhammad A, MD;  Location: [facility] INVASIVE CV LAB;  Service: Cardiovascular;  Laterality: Bilateral;  . VASECTOMY       Medications Prior to Admission: Prior to Admission medications   Medication Sig Start Date End Date Taking? Authorizing Provider  amLODipine (NORVASC) 5 MG tablet Take 1 tablet (5 mg total) by mouth 2 (two) times a day. 12/15/18  Yes Dunn, Raymon Muttonyan M, PA-C  aspirin 81 MG tablet Take 81 mg by mouth daily.   Yes [provider]  atorvastatin (LIPITOR) 40 MG tablet TAKE ONE TABLET BY MOUTH DAILY 03/01/19  Yes Dunn,  Raymon Muttonyan M, PA-C  carvedilol (COREG) 25 MG tablet TAKE ONE TABLET BY MOUTH TWICE A DAY WITH MEALS 11/18/18  Yes Joycelyn ManBurnette, [name], PA-C  isosorbide mononitrate (IMDUR) 60 MG 24 hr tablet Take 1 tablet (60 mg total) by mouth daily. 11/08/18  Yes Antonieta IbaGollan, Timothy J, MD  losartan (COZAAR) 100 MG tablet TAKE ONE TABLET BY MOUTH DAILY 02/21/19  Yes Margaretann LovelessBurnette, [name], PA-C  Multiple Vitamin (MULTIVITAMIN WITH MINERALS) TABS tablet Take 1 tablet by mouth daily.   Yes [provider]  pantoprazole (PROTONIX) 20 MG tablet TAKE TWO TABLETS BY MOUTH TWICE A DAY 01/11/19  Yes Joycelyn ManBurnette, [name], PA-C  potassium chloride (K-DUR) 10 MEQ tablet Take 1 tablet (10 mEq total) by mouth 2 (two) times daily as needed. Patient taking differently: Take 10 mEq by mouth See admin instructions. Take one every AM. Take two with every dose of metolazone. 04/13/18  Yes Gollan, Tollie Pizzaimothy J, MD  Sennosides (SENNA-EX PO) Take 1 tablet by mouth 2 (two) times daily.    Yes [provider]  torsemide (DEMADEX) 20 MG tablet Take 40 mg qd alternating with 20mg  every other day as directed. 01/30/19  Yes Dunn, Raymon Muttonyan M, PA-C  isosorbide mononitrate (IMDUR) 60 MG 24 hr tablet Take 1 tablet (60 mg total) by mouth daily. 05/18/18 08/16/18  Sondra Bargesunn, [name], PA-C  Liraglutide -Weight Management (SAXENDA) 18 MG/3ML SOPN Inject 0.6 mg into the skin daily. Increase by 0.6mg  weekly until max dose of 3mg  achieved Patient not taking: Reported on 03/10/2019 12/22/18   Margaretann LovelessBurnette, [name], PA-C  naproxen sodium (ALEVE) 220 MG tablet Take 440 mg by mouth 2 (two) times daily as needed (mild pain).    [provider]  sildenafil (VIAGRA) 100 MG tablet Take 100 mg by mouth daily as needed for erectile dysfunction.    [provider]     Allergies:    Allergies  Allergen Reactions  . Lisinopril Cough    Social History:   Social History   Socioeconomic History  . Marital status: Divorced    Spouse name: Not on file  . Number  of children: Not on file  . Years of education: Not on file  . Highest education level: Not on file  Occupational History  . Not on file  Social Needs  . Financial resource strain: Not on file  . Food insecurity    Worry: Not on file    Inability: Not on file  . Transportation needs    Medical: Not on file    Non-medical: Not on file  Tobacco Use  . Smoking status: Former Smoker    Packs/day: 0.25    Years: 2.00    Pack years: 0.50    Types: Cigarettes  Quit date: 05/26/2005    Years since quitting: 13.8  . Smokeless tobacco: Former Neurosurgeon    Types: Chew  Substance and Sexual Activity  . Alcohol use: No  . Drug use: No  . Sexual activity: Not on file  Lifestyle  . Physical activity    Days per week: Not on file    Minutes per session: Not on file  . Stress: Not on file  Relationships  . Social Musician on phone: Not on file    Gets together: Not on file    Attends religious service: Not on file    Active member of club or organization: Not on file    Attends meetings of clubs or organizations: Not on file    Relationship status: Not on file  . Intimate partner violence    Fear of current or ex partner: Not on file    Emotionally abused: Not on file    Physically abused: Not on file    Forced sexual activity: Not on file  Other Topics Concern  . Not on file  Social History Narrative  . Not on file    Family History:   [name] family history includes Arthritis in his mother; Cancer in his maternal grandfather; Cataracts in his maternal grandmother; Glaucoma in his maternal grandmother; Heart attack in his paternal grandfather; Heart attack (age of onset: 74) in his father; Heart murmur in his brother; Hyperlipidemia in his father; Hypertension in his father and paternal grandfather; Valvular heart disease in his brother. There is no history of Prostate cancer, Bladder Cancer, or Kidney cancer.    ROS:  Please see the history of present illness.   All other ROS reviewed and negative.     Physical Exam/Data:   Vitals:   03/10/19 1507 03/10/19 1944 03/11/19 0420 03/11/19 0742  BP: 136/86 139/70 129/71 (!) 150/82  Pulse: 66 65 60 (!) 57  Resp: Temp: 98.5 F (36.9 C) 98.3 F (36.8 C) 97.8 F (36.6 C) 98 F (36.7 C)  TempSrc: Oral Oral Oral   SpO2: 97% 97% 95% 97%  Weight: 106.8 kg  105.7 kg   Height:  (1.702 m)       Intake/Output Summary (Last 24 hours) at 03/11/2019 1041 Last data filed at 03/11/2019 1002 Gross per 24 hour  Intake 840 ml  Output 0 ml  Net 840 ml   Last 3 Weights 03/11/2019 03/10/2019 03/10/2019  Weight (lbs) 233 lb 235 lb 8 oz 235 lb  Weight (kg) 105.688 kg 106.822 kg 106.595 kg     Body mass index is 36.49 kg/m.  General:  Well nourished, well developed, in no acute distress, obese HEENT: normal Lymph: no adenopathy Neck: no JVD Endocrine:  No thryomegaly Vascular: No carotid bruits; FA pulses 2+ bilaterally without bruits  Cardiac:  normal S1, S2; RRR; no murmur  Lungs:  clear to auscultation bilaterally, no wheezing, rhonchi or rales  Abd: soft, nontender, no hepatomegaly  Ext: trace edema Musculoskeletal:  No deformities, BUE and BLE strength normal and equal Skin: warm and dry  Neuro:  CNs 2-12 intact, no focal abnormalities noted Psych:  Normal affect    EKG:  The ECG  was personally reviewed and demonstrates normal sinus rhythm, nonspecific T wave changes.  Relevant CV Studies: No new cardiac studies performed.  Laboratory Data:  High Sensitivity Troponin:   Recent Labs  Lab 03/10/19 0953 03/10/19 1143 03/10/19 1519 03/10/19 1714  TROPONINIHS  12 12 10 10       Chemistry Recent Labs  Lab 03/10/19 0953 03/10/19 1143 03/11/19 0425  NA 138  --  138  K 3.5  --  3.4*  CL 102  --  100  CO2 24  --  24  GLUCOSE 187*  --  131*  BUN 22  --  21  CREATININE 1.19 1.12 1.11  CALCIUM 9.3  --  8.9  GFRNONAA >60 >60 >60  GFRAA >60 >60 >60  ANIONGAP 12  --  14     Recent Labs  Lab 03/10/19 0953  PROT 7.5  ALBUMIN 4.2  AST 47*  ALT 68*  ALKPHOS 59  BILITOT 0.8   Hematology Recent Labs  Lab 03/10/19 1143 03/11/19 0425  WBC 7.4 6.5  RBC 4.56 4.22  HGB 14.2 13.0  HCT 40.0 37.6*  MCV 87.7 89.1  MCH 31.1 30.8  MCHC 35.5 34.6  RDW 13.4 13.3  PLT 204 164   BNPNo results for input(s): BNP, PROBNP in the last 168 hours.  DDimer No results for input(s): DDIMER in the last 168 hours.   Radiology/Studies:  No results found.  Assessment and Plan:   63 year old man with a history of CAD/CABG x2, OSA, hypertension who presents due to chest discomfort in the setting of elevated blood pressures.  Symptoms improved with improvement in blood pressure.  Patient showed me a blood pressure log from home which indicates poorly controlled blood pressure.  In the morning systolics range from 789F to 190s and in the evenings systolics range from 810F to 180s.  His cardiac enzymes were within normal limits.  It sounds as though his symptoms of chest discomfort is secondary to the elevated blood pressure.  He has history of heart failure preserved ejection fraction could also be contributing.  1.  Chest pain -Present when hypertensive, troponin normal. -Start hydralazine 25 mg twice daily.  Will uptitrate if needed as outpatient. -Patient counseled to continue checking blood pressures frequently. -Continue patient's other anti-anginal outpatient meds.  2.  CAD/CABG -Continue aspirin, Lipitor, Coreg 25 twice daily, Imdur, losartan.  3.  Hypertension -Start hydralazine 25 mg twice daily, continue order outpatient high blood pressure meds  4. HFpEF -Continue PTA Lasix 20 twice daily -No echo in chart.  We can obtain an echocardiogram as outpatient.  5.  OSA -Continue using CPAP nightly  6. Obesity -Heart healthy diet, and low calorie diet encouraged -Patient advised to try and see a weight loss specialist/nutritionist as this will help with his OSA,  hypertension and other comorbidities.  Patient can be discharged from a cardiac perspective.  With close follow-up as outpatient.  Total encounter time more than 70 minutes  Greater than 50% was spent in counseling and coordination of care with the patient including discussing blood pressure medication changes and side effects, weight loss control, joining a weight loss program.  For questions or updates, please contact [place] Please consult [email or web address] for contact info under        Signed, Kate Sable, MD  03/11/2019 10:41 AM

## 2019-03-11 NOTE — Plan of Care (Signed)
  Problem: Clinical Measurements: Goal: Ability to maintain clinical measurements within normal limits will improve Outcome: Progressing Goal: Diagnostic test results will improve Outcome: Progressing Goal: Cardiovascular complication will be avoided Outcome: Progressing   Problem: Activity: Goal: Risk for activity intolerance will decrease Outcome: Progressing   Problem: Pain Managment: Goal: General experience of comfort will improve Outcome: Progressing   Problem: Safety: Goal: Ability to remain free from injury will improve Outcome: Progressing

## 2019-03-14 NOTE — Progress Notes (Signed)
Patient: Jonathon Newman Male    DOB: 11/13/1955   63 y.o.   MRN: [id number] Visit Date: 03/15/2019  Today's Provider: Margaretann LovelessJennifer M Burnette, PA-C   Chief Complaint  Patient presents with  . Hospitalization Follow-up   Subjective:     HPI   Follow up Hospitalization  Patient was admitted to [facility] on 03/10/19 and discharged on 03/11/19. He was treated for Unstable angina. Treatment for this included Adding Hydralazone 25mg  BID for uncontrolled BP.  Also advised to follow up with Cardiology, Eula Listenyan Dunn, PA-C in 1 week. Telephone follow up was not done. He reports excellent compliance with treatment. He reports this condition is Improved.  Pt reports he is still feeling weak and short of breath with activity.   He feels most of his symptoms above are due to weight gain. He is not very active due to back pain and working a lot. He does not follow a healthy diet. We did attempt to get Saxenda covered for him since he is prediabetic, obese and has heart disease. His insurance denied coverage. He does not wish to try Contrave because he does not want to add any more pills and contrave is 2 tabs BID. I do not feel phentermine is a good choice due to his heart disease and currently having symptoms.   Also complains of dizziness still. He has had for 3 years. Most often occurs with change of body and head positions. Has seen ENT and done balance [facility] [place] [facility]   Of note a 5mm lung nodule was noted on CXR in the left mid to lower lung. Does have remote smoking history of only 1/4 ppd x 2 years. Also used chewing tobacco in the past. Could have environmental exposure from [employer].  ------------------------------------------------------------------------------------   Allergies  Allergen Reactions  . Lisinopril Cough     Current Outpatient Medications:  .  amLODipine (NORVASC) 5 MG tablet, Take 1 tablet (5 mg total) by mouth 2 (two) times a day., Disp: 180 tablet,  Rfl: 3 .  aspirin 81 MG tablet, Take 81 mg by mouth daily., Disp: , Rfl:  .  atorvastatin (LIPITOR) 40 MG tablet, TAKE ONE TABLET BY MOUTH DAILY, Disp: 90 tablet, Rfl: 0 .  carvedilol (COREG) 25 MG tablet, TAKE ONE TABLET BY MOUTH TWICE A DAY WITH MEALS, Disp: 180 tablet, Rfl: 1 .  hydrALAZINE (APRESOLINE) 25 MG tablet, Take 1 tablet (25 mg total) by mouth 2 (two) times daily., Disp: 60 tablet, Rfl: 0 .  isosorbide mononitrate (IMDUR) 60 MG 24 hr tablet, Take 1 tablet (60 mg total) by mouth daily., Disp: 90 tablet, Rfl: 0 .  Liraglutide -Weight Management (SAXENDA) 18 MG/3ML SOPN, Inject 0.6 mg into the skin daily. Increase by 0.6mg  weekly until max dose of 3mg  achieved, Disp: 4 pen, Rfl: 5 .  losartan (COZAAR) 100 MG tablet, TAKE ONE TABLET BY MOUTH DAILY, Disp: 90 tablet, Rfl: 1 .  Multiple Vitamin (MULTIVITAMIN WITH MINERALS) TABS tablet, Take 1 tablet by mouth daily., Disp: , Rfl:  .  naproxen sodium (ALEVE) 220 MG tablet, Take 440 mg by mouth 2 (two) times daily as needed (mild pain)., Disp: , Rfl:  .  pantoprazole (PROTONIX) 20 MG tablet, TAKE TWO TABLETS BY MOUTH TWICE A DAY, Disp: 360 tablet, Rfl: 1 .  potassium chloride (K-DUR) 10 MEQ tablet, Take 1 tablet (10 mEq total) by mouth 2 (two) times daily as needed. (Patient taking differently: Take 10  mEq by mouth 2 (two) times daily. ), Disp: 180 tablet, Rfl: 3 .  Sennosides (SENNA-EX PO), Take 1 tablet by mouth 2 (two) times daily. , Disp: , Rfl:  .  sildenafil (VIAGRA) 100 MG tablet, Take 100 mg by mouth daily as needed for erectile dysfunction., Disp: , Rfl:  .  torsemide (DEMADEX) 20 MG tablet, Take 40 mg qd alternating with 20mg  every other day as directed., Disp: 132 tablet, Rfl: 0 .  isosorbide mononitrate (IMDUR) 60 MG 24 hr tablet, Take 1 tablet (60 mg total) by mouth daily., Disp: 90 tablet, Rfl: 3  Review of Systems  Constitutional: Positive for fatigue and unexpected weight change. Negative for activity change, appetite change,  chills, diaphoresis and fever.  Respiratory: Positive for cough and shortness of breath. Negative for apnea, choking, chest tightness, wheezing and stridor.   Cardiovascular: Positive for leg swelling. Negative for chest pain and palpitations.  Gastrointestinal: Negative.   Neurological: Positive for dizziness and weakness. Negative for light-headedness and headaches.    Social History   Tobacco Use  . Smoking status: Former Smoker    Packs/day: 0.25    Years: 2.00    Pack years: 0.50    Types: Cigarettes    Quit date: 05/26/2005    Years since quitting: 13.8  . Smokeless tobacco: Former 14/05/2005    Types: Chew  Substance Use Topics  . Alcohol use: No      Objective:   BP 136/82 (BP Location: Left Arm, Patient Position: Sitting, Cuff Size: Large)   Pulse 62   Temp (!) 96.9 F (36.1 C) (Temporal)   Wt 233 lb (105.7 kg)   SpO2 96%   BMI 36.49 kg/m  Vitals:   03/15/19 1104  BP: 136/82  Pulse: 62  Temp: (!) 96.9 F (36.1 C)  TempSrc: Temporal  SpO2: 96%  Weight: 233 lb (105.7 kg)  Body mass index is 36.49 kg/m.   Physical Exam Vitals signs reviewed.  Constitutional:      General: He is not in acute distress.    Appearance: Normal appearance. He is well-developed. He is obese. He is not ill-appearing or diaphoretic.  HENT:     Head: Normocephalic and atraumatic.     Right Ear: Tympanic membrane, ear canal and external ear normal. There is no impacted cerumen.     Left Ear: Tympanic membrane, ear canal and external ear normal. There is no impacted cerumen.  Eyes:     General: No scleral icterus.    Extraocular Movements: Extraocular movements intact.     Right eye: No nystagmus.     Left eye: No nystagmus.  Neck:     Musculoskeletal: Normal range of motion and neck supple.     Thyroid: No thyromegaly.     Vascular: No JVD.     Trachea: No tracheal deviation.  Cardiovascular:     Rate and Rhythm: Normal rate and regular rhythm.     Heart sounds: Normal heart  sounds. No murmur. No friction rub. No gallop.   Pulmonary:     Effort: Pulmonary effort is normal. No respiratory distress.     Breath sounds: Normal breath sounds. No wheezing or rales.  Lymphadenopathy:     Cervical: No cervical adenopathy.  Neurological:     Mental Status: He is alert.  Psychiatric:        Mood and Affect: Mood normal.        Behavior: Behavior normal.        Thought Content: Thought  content normal.        Judgment: Judgment normal.      No results found for any visits on 03/15/19.     Assessment & Plan    1. Unstable angina (HCC) BP improved with adding Hydralazine 25mg  BID. He has not had the left sided chest pain and SOB like he had that made him go to the ER on 03/10/19. He still has occasional SOB with activity which he equates to his deconditioning and weight. Continue all medications and f/u with Cardiology on 03/20/19 as scheduled. I will await to see what testing cardiology recommends and what the CT chest reveals. Will schedule f/u pending these tests and f/u appts. Will most likely shoot for f/u in December for A1c recheck.   2. Abnormal findings on diagnostic imaging of lung 53mm nodule (new from 2017 comparison) noted on CXR in mid to lower left lung. Chest CT ordered as below. I will f/u pending results. Anoro ellipta inhaler given to patient as a sample today. He is to call if this inhaler helps the SOB and we will send Rx.  - CT Chest Wo Contrast; Future  3. Essential hypertension Improving with hydralazine 25mg  BID. Continue amlodipine 5mg  BID, carvedilol 25mg  BID, Imdur 60mg  daily, losartan 100mg  daily, and torsemide 20mg  BID. Seeing cardiology on 03/20/19.  4. Benign paroxysmal positional vertigo due to bilateral vestibular disorder Patient knows Dix-Halpike maneuver and also demonstrated brandt-daroff exercises for him to try at home.   5. Prediabetes Last A1c was 6.2 in July 2020. Discussed eating habits and trying to be low carb/low sugar.  Will recheck in December 2020.  6. Class 2 severe obesity due to excess calories with serious comorbidity and body mass index (BMI) of 36.0 to 36.9 in adult [facility]) Tried to get [place] approved, but insurance denied. Patient declines Contrave due to amount of pills he would have to take. Phentermine would be affordable out of pocket, but with his heart history I do not feel comfortable prescribing this at this time. Discussed bariatric clinics, locally the only bariatric clinic is surgical, which he does not want. Discussed the bariatric clinic in [place], but currently he does not want to commute to [place] for those visits.   7. SOB (shortness of breath) See above medical treatment plan for #2.      Mar Daring, PA-C  [facility]

## 2019-03-14 NOTE — Discharge Summary (Signed)
Jonathon Newman, is a 63 y.o. male  DOB 1955-12-11  MRN [id number].  Admission date:  03/10/2019  Admitting Physician  Epifanio Lesches, MD  Discharge Date:  03/11/2019   Primary MD  Mar Daring, PA-C  Recommendations for primary care physician for things to follow:   Follow-up with PCP in 1 week Follow-up with [facility] and heart care with Christell Faith in 1 week.   Admission Diagnosis  Unstable angina (HCC) [I20.0]   Discharge Diagnosis  Unstable angina ([place]) [I20.0]    Active Problems:   Chest pain      Past Medical History:  Diagnosis Date  . Arthritis   . CHF (congestive heart failure) ([place])   . Coronary artery disease    PCI and Cypher drug-eluting stent placement to the right coronary artery in 2010.  NSTEMI in 2016.  Cardiac catheterization showed patent RCA stent, significant distal left main disease with an FFR ratio of 0.76 and ostial left circumflex stenosis.  Underwent CABG with LIMA to LAD and SVG to OM 2.  . Dyspnea   . GERD (gastroesophageal reflux disease)   . Hypercholesteremia   . Hypertension   . S/P coronary artery bypass graft x 2   . Sleep apnea    uses CPAP    Past Surgical History:  Procedure Laterality Date  . APPENDECTOMY    . BACK SURGERY  11/01/2017   SL 5 and S1  . CARDIAC CATHETERIZATION    . CARPAL TUNNEL RELEASE     left hand  . CHOLECYSTECTOMY    . CHOLECYSTECTOMY    . CORONARY ARTERY BYPASS GRAFT  04/21/2015   CABG x 2 [place] [facility] LIMA to LAD amd SVG to OM2  . CORONARY STENT PLACEMENT  2010   Cordis Cypher Sirolimus-eluting stent 2.50 mm x 26 mm placed to the RCA at [place] N/A 08/04/2017   Procedure: ESOPHAGEAL MANOMETRY (EM);  Surgeon: Lin Landsman, MD;  Location: [facility] ENDOSCOPY;  Service:  Endoscopy;  Laterality: N/A;  . FRACTURE SURGERY    . KNEE SURGERY    . KNEE SURGERY     right knee   . LAMINECTOMY     "plate in neck L3-Y1"  . LEFT HEART CATH AND CORS/GRAFTS ANGIOGRAPHY N/A 05/02/2018   Procedure: CORS/GRAFTS ANGIOGRAPHY;  Surgeon: Wellington Hampshire, MD;  Location: [facility];  Service: Cardiovascular;  Laterality: N/A;  . RIGHT/LEFT HEART CATH AND CORONARY ANGIOGRAPHY Bilateral 05/02/2018   Procedure: LEFT HEART CATH;  Surgeon: Wellington Hampshire, MD;  Location: [facility];  Service: Cardiovascular;  Laterality: Bilateral;  . VASECTOMY         History of present illness and  Hospital Course:     Kindly see H&P for history of present illness and admission details, please review complete Labs, Consult reports and Test reports for all details in brief  HPI  from the history and physical done on the day of admission 63 year old male with history of essential hypertension, heart failure with preserved ejection fraction, obstructive sleep apnea on CPAP, CAD, CABG comes in because of squeezing chest pain while at work and also noted to have elevated blood pressure for the last 3 days so admitted for chest pain evaluation because of his risk factors of CAD, CABG.   Hospital Course  #1. left-sided chest pain secondary to elevated blood pressure at home, patient high-sensitivity troponins are negative for 3 times, patient  admitted to telemetry secondary to history of CAD, CABG, [facility], because his troponins were negative without EKG changes cardiology felt that patient can be discharged safely home.  Patient advised to continue his home medications including aspirin, statins, Coreg 25 mg p.o. twice daily, Imdur, losartan.  2.  Essential hypertension, uncontrolled, has been having BP issues at home for the past 3 days of admission, patient said that his SBP was as high as 200s, seen by cardiology and added hydralazine 25 mg p.o.  twice daily in addition to his blood pressure medication and this is his new medicine at discharge.  Told the patient about this.  Patient [facility], he always follows up with  [name].  Advised to keep the appointment.  With him. 3.  Obstructive sleep apnea, continue CPAP at night. History of heart failure with preserved ejection fraction, p patient takes torsemide at home advised to continue that.      Discharge Condition: Stable   Follow UP  Follow-up Information    Margaretann LovelessBurnette, [name], PA-C. Schedule an appointment as soon as possible for a visit in 5 day(s).   Specialty: Family Medicine Why: Patient can make the appointment on Monday Contact information: [address] [place] [address] [phone]        Sondra Bargesunn, [name], PA-C. Schedule an appointment as soon as possible for a visit in 3 day(s).   Specialties: Physician Assistant, Cardiology, Radiology Contact information: [address] [place] [address] [phone]             Discharge Instructions  and  Discharge Medications      Allergies as of 03/11/2019      Reactions   Lisinopril Cough      Medication List    TAKE these medications   amLODipine 5 MG tablet Commonly known as: NORVASC Take 1 tablet (5 mg total) by mouth 2 (two) times a day.   aspirin 81 MG tablet Take 81 mg by mouth daily.   atorvastatin 40 MG tablet Commonly known as: LIPITOR TAKE ONE TABLET BY MOUTH DAILY   carvedilol 25 MG tablet Commonly known as: COREG TAKE ONE TABLET BY MOUTH TWICE A DAY WITH MEALS   hydrALAZINE 25 MG tablet Commonly known as: APRESOLINE Take 1 tablet (25 mg total) by mouth 2 (two) times daily.   isosorbide mononitrate 60 MG 24 hr tablet Commonly known as: IMDUR Take 1 tablet (60 mg total) by mouth daily.   isosorbide mononitrate 60 MG 24 hr tablet Commonly known as: IMDUR Take 1 tablet (60 mg total) by mouth daily.    losartan 100 MG tablet Commonly known as: COZAAR TAKE ONE TABLET BY MOUTH DAILY   multivitamin with minerals Tabs tablet Take 1 tablet by mouth daily.   naproxen sodium 220 MG tablet Commonly known as: ALEVE Take 440 mg by mouth 2 (two) times daily as needed (mild pain).   pantoprazole 20 MG tablet Commonly known as: PROTONIX TAKE TWO TABLETS BY MOUTH TWICE A DAY   potassium chloride 10 MEQ tablet Commonly known as: KLOR-CON Take 1 tablet (10 mEq total) by mouth 2 (two) times daily as needed. What changed:   when to take this  additional instructions   Saxenda 18 MG/3ML Sopn Generic drug: Liraglutide -Weight Management Inject 0.6 mg into the skin daily. Increase by 0.6mg  weekly until max dose of 3mg  achieved   SENNA-EX PO Take 1 tablet by  mouth 2 (two) times daily.   sildenafil 100 MG tablet Commonly known as: VIAGRA Take 100 mg by mouth daily as needed for erectile dysfunction.   torsemide 20 MG tablet Commonly known as: DEMADEX Take 40 mg qd alternating with 20mg  every other day as directed.         Diet and Activity recommendation: See Discharge Instructions above   Consults obtained -cardiology   Major procedures and Radiology Reports - PLEASE review detailed and final reports for all details, in brief -      Dg Chest Port 1 View  Result Date: 03/10/2019 CLINICAL DATA:  Chest EXAM: PORTABLE CHEST 1 VIEW COMPARISON:  August 26, 2015 FINDINGS: No edema or consolidation. There is a 5 mm nodular opacity in the periphery of the left mid to lower lung region which was not present on prior study. Heart size and pulmonary vascular normal. Patient is status post coronary artery bypass grafting. Several sternal wires superiorly show evidence of fracture, a new finding compared to the August 26, 2015. No adenopathy. There is postoperative change in the lower cervical region. IMPRESSION: 5 mm nodular opacity in the periphery of the left mid lower lung region, not  present on previous study. This finding warrants noncontrast enhanced chest CT to further assess. No edema or consolidation. Stable cardiac silhouette. Areas of postoperative change noted. Note fractures in several superior sternal wires. Electronically Signed   By: August 28, 2015 III M.D.   On: 03/10/2019 10:30    Micro Results     Recent Results (from the past 240 hour(s))  SARS Coronavirus 2 [facility] order, Performed in [facility] hospital lab) Nasopharyngeal Nasopharyngeal Swab     Status: None   Collection Time: 03/10/19 11:08 AM   Specimen: Nasopharyngeal Swab  Result Value Ref Range Status   SARS Coronavirus 2 NEGATIVE NEGATIVE Final    Comment: (NOTE) If result is NEGATIVE SARS-CoV-2 target nucleic acids are NOT DETECTED. The SARS-CoV-2 RNA is generally detectable in upper and lower  respiratory specimens during the acute phase of infection. The lowest  concentration of SARS-CoV-2 viral copies this assay can detect is 250  copies / mL. A negative result does not preclude SARS-CoV-2 infection  and should not be used as the sole basis for treatment or other  patient management decisions.  A negative result may occur with  improper specimen collection / handling, submission of specimen other  than nasopharyngeal swab, presence of viral mutation(s) within the  areas targeted by this assay, and inadequate number of viral copies  (<250 copies / mL). A negative result must be combined with clinical  observations, patient history, and epidemiological information. If result is POSITIVE SARS-CoV-2 target nucleic acids are DETECTED. The SARS-CoV-2 RNA is generally detectable in upper and lower  respiratory specimens dur ing the acute phase of infection.  Positive  results are indicative of active infection with SARS-CoV-2.  Clinical  correlation with patient history and other diagnostic information is  necessary to determine patient infection status.  Positive results do  not  rule out bacterial infection or co-infection with other viruses. If result is PRESUMPTIVE POSTIVE SARS-CoV-2 nucleic acids MAY BE PRESENT.   A presumptive positive result was obtained on the submitted specimen  and confirmed on repeat testing.  While 2019 novel coronavirus  (SARS-CoV-2) nucleic acids may be present in the submitted sample  additional confirmatory testing may be necessary for epidemiological  and / or clinical management purposes  to differentiate between  SARS-CoV-2 and other  Sarbecovirus currently known to infect humans.  If clinically indicated additional testing with an alternate test  methodology [phone]) is advised. The SARS-CoV-2 RNA is generally  detectable in upper and lower respiratory sp ecimens during the acute  phase of infection. The expected result is Negative. Fact Sheet for Patients:  BoilerBrush.com.cy Fact Sheet for Healthcare Providers: https://pope.com/ This test is not yet approved or cleared by the [place] FDA and has been authorized for detection and/or diagnosis of SARS-CoV-2 by FDA under an Emergency Use Authorization (EUA).  This EUA will remain in effect (meaning this test can be used) for the duration of the COVID-19 declaration under Section 564(b)(1) of the Act, 21 U.S.C. section 360bbb-3(b)(1), unless the authorization is terminated or revoked sooner. Performed at [facility], [address] [place], [place] [address]        Today   Subjective:   Jonathon Newman today has no headache,no chest abdominal pain,no new weakness tingling or numbness, feels much better wants to go home today.   Objective:   Blood pressure (!) 150/82, pulse (!) 57, temperature 98 F (36.7 C), resp. rate 18, height  (1.702 m), weight 105.7 kg, SpO2 97 %.  No intake or output data in the 24 hours ending 03/14/19 1538  Exam Awake Alert, Oriented x 3, No new F.N deficits, Normal  affect [place].AT,PERRAL Supple Neck,No JVD, No cervical lymphadenopathy appriciated.  Symmetrical Chest wall movement, Good air movement bilaterally, CTAB RRR,No Gallops,Rubs or new Murmurs, No Parasternal Heave +ve B.Sounds, Abd Soft, Non tender, No organomegaly appriciated, No rebound -guarding or rigidity. No Cyanosis, Clubbing or edema, No new Rash or bruise  Data Review   CBC w Diff:  Lab Results  Component Value Date   WBC 6.5 03/11/2019   HGB 13.0 03/11/2019   HGB 15.2 12/15/2018   HCT 37.6 (L) 03/11/2019   HCT 43.1 12/15/2018   PLT 164 03/11/2019   PLT 286 12/15/2018    CMP:  Lab Results  Component Value Date   NA 138 03/11/2019   NA 136 12/15/2018   K 3.4 (L) 03/11/2019   CL 100 03/11/2019   CO2 24 03/11/2019   BUN 21 03/11/2019   BUN 41 (H) 12/15/2018   CREATININE 1.11 03/11/2019   PROT 7.5 03/10/2019   PROT 7.5 02/02/2018   ALBUMIN 4.2 03/10/2019   ALBUMIN 4.5 02/02/2018   BILITOT 0.8 03/10/2019   BILITOT 0.3 02/02/2018   ALKPHOS 59 03/10/2019   AST 47 (H) 03/10/2019   ALT 68 (H) 03/10/2019  .   Total Time in preparing paper work, data evaluation and todays exam - 35 minutes  Katha Hamming M.D on 03/11/2019 at 3:38 PM    Note: This dictation was prepared with Dragon dictation along with smaller phrase technology. Any transcriptional errors that result from this process are unintentional.

## 2019-03-15 ENCOUNTER — Encounter: Payer: Self-pay | Admitting: Physician Assistant

## 2019-03-15 ENCOUNTER — Ambulatory Visit (INDEPENDENT_AMBULATORY_CARE_PROVIDER_SITE_OTHER): Admitting: Physician Assistant

## 2019-03-15 ENCOUNTER — Other Ambulatory Visit: Payer: Self-pay

## 2019-03-15 VITALS — BP 136/82 | HR 62 | Temp 96.9°F | Wt 233.0 lb

## 2019-03-15 DIAGNOSIS — R918 Other nonspecific abnormal finding of lung field: Secondary | ICD-10-CM | POA: Insufficient documentation

## 2019-03-15 DIAGNOSIS — R7303 Prediabetes: Secondary | ICD-10-CM | POA: Insufficient documentation

## 2019-03-15 DIAGNOSIS — Z6836 Body mass index (BMI) 36.0-36.9, adult: Secondary | ICD-10-CM

## 2019-03-15 DIAGNOSIS — H8113 Benign paroxysmal vertigo, bilateral: Secondary | ICD-10-CM | POA: Diagnosis not present

## 2019-03-15 DIAGNOSIS — I10 Essential (primary) hypertension: Secondary | ICD-10-CM | POA: Diagnosis not present

## 2019-03-15 DIAGNOSIS — R0602 Shortness of breath: Secondary | ICD-10-CM

## 2019-03-15 DIAGNOSIS — I200 Unstable angina: Secondary | ICD-10-CM | POA: Diagnosis not present

## 2019-03-17 NOTE — Progress Notes (Signed)
Cardiology Office Note    Date:  03/20/2019   ID:  Jonathon HerDavid Borman, DOB 06/14/1956, MRN [id number]  PCP:  Margaretann LovelessBurnette, [name], PA-C  Cardiologist:  Julien Nordmannimothy Gollan, MD  Electrophysiologist:  None   Chief Complaint: Hospital follow up  History of Present Illness:   Jonathon Newman is a 63 y.o. male with history of CAD s/p 2-vessel [facility] in [facility], HFpEF, pulmonary nodule, chronic positional dizziness felt to BPPV, prediabetes, HTN, HLD, obesity, sleep apnea on CPAP, and prior tobacco abuse who presents forhospital follow-up as detailed below.  Patient is retired from [employer] though is no longer eligible for his care through the [facility]. He previously underwent Cypher drug-eluting stent to the RCA in 2010. This was followed by a NSTEMI in 2016 with cardiac cath showing a patent RCA stent with significant distal left main disease with an FFR of 0.76 and ostial LCx stenosis. He underwent 2-vessel CABG with a LIMA to LAD and SVG to OM2 in 2016. He was seen [id number] atypical chest pain and exertional dyspnea. He underwent Lexiscan Myoview on 07/08/2017 that showed no evidence of ischemia with a normal EF. When he was evaluated in 08/2017, he noted some improvement in symptoms but continued to have mild dyspnea and leg edema that was worse at the end of the day. He was taking torsemide 40 mg in the morning and 20 mg in the evening. Weight at that visit was 231 pounds which was up 3 pounds from 06/2017 visit.    Patient was seen as an add-on on 10/30/19by Dr. Mariah MillingGollan noting a 12-pound weight gain. Weight of 230 pounds at that visit. Patient reported a dry weight of ~ 220 pounds. Recent weight in 01/2018 at PCP office of 224 pounds. He reported compliance with medications and denied excess fluids or salt intake. He continued to take torsemide 40 mg in the morning and 20 mg in the afternoon with an occasional extra dose before bed.He reported compliance with  his CPAP. He noted dyspnea with ambulation of 50-70 feet. BP was running high in the 160-180 systolic range. He also noted dizziness in which he was concerned may be ischemia. He was started on metolazone 5 mg for weight >230 pounds and 2.5 mg for weight >227 pounds, with recommendation to take torsemide 40 mg bid. He was also started on Imdur.Follow-up BMP on 04/20/2018 showed slight bump in serum creatinine to 1.31 with a baseline approximately 1.0 with recommendation to hold metolazone.Patient was seen in the office on 04/25/2018, noting significant improvement in his lower extremity edema, though continued to note abdominal distension, dizziness, and exertional chest pressure. He was concerned these symptoms were his anginal equivalent. He continued to take metolazone 5 mg on a daily basis, regardless of his home weight. His weight at that visit was noted to be 221 pounds. He was advised to hold metolazone. He requested a cardiac cath, though this was deferred until we could assess the stability of his renal function. Recheck bmet on 04/25/18 showed a worsening SCr from 1.31-->1.53. At that time, he was advised to hold torsemide as well. Recheck bmet on 04/29/18 showed a SCrof 1.05. He underwent diagnostic R/LHC on 05/02/2018 that showed significant underlying left main disease with patent grafts including LIMA to LAD and SVG to OM2. The RCA stent was patent with minimal restenosis. He had normal LVSF with a mildly elevated LVEDP at 14-15 mmHg. There was no cardiac culprit for his symptoms  based on this cath. Medical therapy was advised. He was seen in the ED on 05/06/18 for radicular back pain with MRI of the spine showing moderate left L4-5 and L5-S1 neuro-foraminal stenosis.  He was seen in the office on 05/18/2018 and continued to note intermittent dizziness that was not associated with positional changes.  At times, there were palpitations associated with his dizziness.  He reported previously wearing  outpatient cardiac monitoring through the [facility] health system that was unrevealing for significant arrhythmia in the setting of his dizziness.  He declined repeat cardiac monitoring.  His mild lower extremity swelling was stable and he remained on torsemide 40 mg twice daily.  It was felt there was some degree of dependent edema.  He was having to sleep in a recliner secondary to significant low back pain with radiculopathy.  He has subsequently been evaluated by pulmonology with suspicion that his CPAP was malfunctioning with recommendation to perform repeat sleep study.  He was most recently seen in our office in 12/2018 and was doing reasonably well from a cardiac perspective.  He continues to note intermittent episodes of dizziness that had previously been felt to be related to vertigo.  He noted occasional palpitations with his dizziness.  His weight was stable between 220 to 224 pounds.  Repeat cardiac monitoring at that time showed NSR with an average heart rate of 72 bpm with a minimum heart rate of 50 bpm, 3 short runs of SVT, no significant arrhythmia to explain the patient's dizziness.  He contacted our office in late 02/2019 noting chest discomfort "every time the weather changes."  BP was elevated in the 180s to 200s systolic.  In this setting, he presented to the ED with normal high-sensitivity troponins x4.  Symptoms were felt to be in the setting of the patient's elevated blood pressure.  He was started on hydralazine and continued on his prior to admission antihypertensive medications.  Patient followed up with PCP on 03/15/2019 continue to note fatigue and shortness of breath with activity.  Documented weight noted to be 233 pounds at their office.  He was living a relatively sedentary lifestyle secondary to back pain and was not following a healthy diet.  He continued to note positional dizziness.  BP was improved at 136/82.  He had not had any further left-sided chest pain or shortness of breath  consistent with symptoms leading to his ED visit as outlined above.  Patient comes in doing reasonably well.  Overall, he does feel better following his recent hospital admission.  He has attributed his presentation to significantly elevated blood pressures and mild volume overload.  It appears his weight has been running between 10 and 14 pounds above baseline.  He denies any chest pain, neck pain, jaw pain, left arm pain, shortness of breath, palpitations, presyncope, or syncope.  No further dizziness.  He does note chronic lower extremity swelling which is typically worse later in the day and better first thing in the morning.  Is abdomen remains distended and unchanged from baseline.  He does note associated bendopnea when putting on his right sock, though not his left sock.  Overall, he continues to live a relatively sedentary lifestyle in the setting of his chronic back pain.   Labs: 02/2019 - Hgb 13.0, PLT 164, potassium 3.4, serum creatinine 1.11, albumin 4.2, AST 47, ALT 68 12/2018 - A1c 6.2  Past Medical History:  Diagnosis Date   Arthritis    CHF (congestive heart failure) (HCC)  Coronary artery disease    PCI and Cypher drug-eluting stent placement to the right coronary artery in 2010.  NSTEMI in 2016.  Cardiac catheterization showed patent RCA stent, significant distal left main disease with an FFR ratio of 0.76 and ostial left circumflex stenosis.  Underwent CABG with LIMA to LAD and SVG to OM 2.   Dyspnea    GERD (gastroesophageal reflux disease)    Hypercholesteremia    Hypertension    S/P coronary artery bypass graft x 2    Sleep apnea    uses CPAP    Past Surgical History:  Procedure Laterality Date   APPENDECTOMY     BACK SURGERY  11/01/2017   SL 5 and S1   CARDIAC CATHETERIZATION     CARPAL TUNNEL RELEASE     left hand   CHOLECYSTECTOMY     CHOLECYSTECTOMY     CORONARY ARTERY BYPASS GRAFT  04/21/2015   CABG x 2 [place] [facility] LIMA to LAD amd SVG to  OM2   CORONARY STENT PLACEMENT  2010   Cordis Cypher Sirolimus-eluting stent 2.50 mm x 26 mm placed to the RCA at [place] N/A 08/04/2017   Procedure: ESOPHAGEAL MANOMETRY (EM);  Surgeon: Lin Landsman, MD;  Location: [facility] ENDOSCOPY;  Service: Endoscopy;  Laterality: N/A;   FRACTURE SURGERY     KNEE SURGERY     KNEE SURGERY     right knee    LAMINECTOMY     "plate in neck U2-G2"   LEFT HEART CATH AND CORS/GRAFTS ANGIOGRAPHY N/A 05/02/2018   Procedure: CORS/GRAFTS ANGIOGRAPHY;  Surgeon: Wellington Hampshire, MD;  Location: [place] [facility];  Service: Cardiovascular;  Laterality: N/A;   RIGHT/LEFT HEART CATH AND CORONARY ANGIOGRAPHY Bilateral 05/02/2018   Procedure: LEFT HEART CATH;  Surgeon: Wellington Hampshire, MD;  Location: [place] [facility];  Service: Cardiovascular;  Laterality: Bilateral;   VASECTOMY      Current Medications: Current Meds  Medication Sig   amLODipine (NORVASC) 5 MG tablet Take 1 tablet (5 mg total) by mouth daily.   aspirin 81 MG tablet Take 81 mg by mouth daily.   atorvastatin (LIPITOR) 40 MG tablet TAKE ONE TABLET BY MOUTH DAILY   carvedilol (COREG) 25 MG tablet TAKE ONE TABLET BY MOUTH TWICE A DAY WITH MEALS   isosorbide mononitrate (IMDUR) 60 MG 24 hr tablet Take 1 tablet (60 mg total) by mouth daily.   losartan (COZAAR) 100 MG tablet TAKE ONE TABLET BY MOUTH DAILY   Multiple Vitamin (MULTIVITAMIN WITH MINERALS) TABS tablet Take 1 tablet by mouth daily.   naproxen sodium (ALEVE) 220 MG tablet Take 440 mg by mouth 2 (two) times daily as needed (mild pain).   pantoprazole (PROTONIX) 20 MG tablet TAKE TWO TABLETS BY MOUTH TWICE A DAY   Sennosides (SENNA-EX PO) Take 1 tablet by mouth 2 (two) times daily.    sildenafil (VIAGRA) 100 MG tablet Take 100 mg by mouth daily as needed for erectile dysfunction.   [DISCONTINUED] amLODipine (NORVASC) 5 MG tablet Take 1 tablet (5 mg total) by mouth 2  (two) times a day.   [DISCONTINUED] hydrALAZINE (APRESOLINE) 25 MG tablet Take 1 tablet (25 mg total) by mouth 2 (two) times daily.   [DISCONTINUED] potassium chloride (K-DUR) 10 MEQ tablet Take 1 tablet (10 mEq total) by mouth 2 (two) times daily as needed. (Patient taking differently: Take 10 mEq by mouth 2 (two) times daily. )   [DISCONTINUED]  torsemide (DEMADEX) 20 MG tablet Take 40 mg qd alternating with  every other day as directed.    Allergies:   Lisinopril   Social History   Socioeconomic History   Marital status: Divorced    Spouse name: Not on file   Number of children: Not on file   Years of education: Not on file   Highest education level: Not on file  Occupational History   Not on file  Social Needs   Financial resource strain: Not on file   Food insecurity    Worry: Not on file    Inability: Not on file   Transportation needs    Medical: Not on file    Non-medical: Not on file  Tobacco Use   Smoking status: Former Smoker    Packs/day: 0.25    Years: 2.00    Pack years: 0.50    Types: Cigarettes    Quit date: 05/26/2005    Years since quitting: 13.8   Smokeless tobacco: Former Neuro[employer]    Types: Chew  Substance and Sexual Activity   Alcohol use: No   Drug use: No   Sexual activity: Not on file  Lifestyle   Physical activity    Days per week: Not on file    Minutes per session: Not on file   Stress: Not on file  Relationships   Social connections    Talks on phone: Not on file    Gets together: Not on file    Attends religious service: Not on file    Active member of club or organization: Not on file    Attends meetings of clubs or organizations: Not on file    Relationship status: Not on file  Other Topics Concern   Not on file  Social History Narrative   Not on file     Family History:  [name] family history includes Arthritis in his mother; Cancer in his maternal grandfather; Cataracts in his maternal grandmother;  Glaucoma in his maternal grandmother; Heart attack in his paternal grandfather; Heart attack (age of onset: 69) in his father; Heart murmur in his brother; Hyperlipidemia in his father; Hypertension in his father and paternal grandfather; Valvular heart disease in his brother. There is no history of Prostate cancer, Bladder Cancer, or Kidney cancer.  ROS:   Review of Systems  Constitutional: Positive for malaise/fatigue. Negative for chills, diaphoresis, fever and weight loss.  HENT: Negative for congestion.   Eyes: Negative for discharge and redness.  Respiratory: Positive for shortness of breath. Negative for cough, hemoptysis, sputum production and wheezing.   Cardiovascular: Negative for chest pain, palpitations, orthopnea, claudication, leg swelling and PND.  Gastrointestinal: Negative for abdominal pain, blood in stool, heartburn, melena, nausea and vomiting.  Genitourinary: Negative for hematuria.  Musculoskeletal: Positive for back pain. Negative for falls and myalgias.  Skin: Negative for rash.  Neurological: Positive for weakness. Negative for dizziness, tingling, tremors, sensory change, speech change, focal weakness and loss of consciousness.  Endo/Heme/Allergies: Does not bruise/bleed easily.  Psychiatric/Behavioral: Negative for substance abuse. The patient is not nervous/anxious.   All other systems reviewed and are negative.    EKGs/Labs/Other Studies Reviewed:    Studies reviewed were summarized above. The additional studies were reviewed today: R/LHC 04/2018:  The left ventricular systolic function is normal.  LV end diastolic pressure is mildly elevated.  The left ventricular ejection fraction is 55-65% by visual estimate.  Prox RCA to Mid RCA lesion is 10% stenosed.  Prox RCA lesion is  30% stenosed.  Dist RCA lesion is 20% stenosed.  SVG graft was visualized by angiography.  The graft exhibits mild diffuse disease.  Ost Cx to Prox Cx lesion is 95%  stenosed.  Mid LM to Dist LM lesion is 80% stenosed.  LIMA and is normal in caliber.  The graft exhibits no disease.   1. Significant underlying left main and RCA disease with patent grafts including LIMA to LAD and SVG to OM 2.  Patent RCA stent with minimal restenosis. 2.  Normal LV systolic function.  Mildly elevated left ventricular end-diastolic pressure at 14 to 15 mmHg.  Recommendations: I do not see a culprit for the patient symptoms. Even his LVEDP is only mildly elevated. Continue medical therapy for coronary artery disease and chronic diastolic heart failure. __________  Luci Bank 12/2018: 14-day ZIO patch monitor: Normal sinus rhythm with an average heart rate of 72 bpm.  Minimum heart rate was 50 bpm. 3 short runs of SVT. No significant arrhythmia to explain dizziness.   EKG:  EKG is ordered today.  The EKG ordered today demonstrates NSR, 66 bpm, normal axis, possible left atrial enlargement, nonspecific ST-T changes (grossly unchanged from prior)  Recent Labs: 03/10/2019: ALT 68 03/11/2019: Hemoglobin 13.0; Platelets 164 03/20/2019: BUN 22; Creatinine, Ser 1.16; Potassium 3.9; Sodium 141  Recent Lipid Panel    Component Value Date/Time   CHOL 185 02/02/2018 1619   TRIG 692 (HH) 02/02/2018 1619   HDL 35 (L) 02/02/2018 1619   CHOLHDL 5.3 (H) 02/02/2018 1619   CHOLHDL 5.5 09/08/2017 0721   VLDL 77 (H) 09/08/2017 0721   LDLCALC Comment 02/02/2018 1619    PHYSICAL EXAM:    VS:  BP (!) 150/80 (BP Location: Left Arm, Patient Position: Sitting, Cuff Size: Normal)    Pulse 66    Ht  (1.702 m)    Wt 234 lb 8 oz (106.4 kg)    SpO2 93%    BMI 36.73 kg/m   BMI: Body mass index is 36.73 kg/m.  Physical Exam  Constitutional: He is oriented to person, place, and time. He appears well-developed and well-nourished.  HENT:  Head: Normocephalic and atraumatic.  Eyes: Right eye exhibits no discharge. Left eye exhibits no discharge.  Neck: Normal range of motion. No JVD  present.  Cardiovascular: Normal rate, regular rhythm, S1 normal, S2 normal and normal heart sounds. Exam reveals no distant heart sounds, no friction rub, no midsystolic click and no opening snap.  No murmur heard. Pulses:      Posterior tibial pulses are 2+ on the right side and 2+ on the left side.  Pulmonary/Chest: Effort normal and breath sounds normal. No respiratory distress. He has no decreased breath sounds. He has no wheezes. He has no rales. He exhibits no tenderness.  Abdominal: Soft. He exhibits distension. There is no abdominal tenderness.  Musculoskeletal:        General: Edema present.     Comments: Trace bilateral pretibial edema  Neurological: He is alert and oriented to person, place, and time.  Skin: Skin is warm and dry. No cyanosis. Nails show no clubbing.  Psychiatric: He has a normal mood and affect. His speech is normal and behavior is normal. Judgment and thought content normal.    Wt Readings from Last 3 Encounters:  03/20/19 234 lb 8 oz (106.4 kg)  03/15/19 233 lb (105.7 kg)  03/11/19 233 lb (105.7 kg)     ASSESSMENT & PLAN:   1. CAD status post CABG with stable  angina: No symptoms concerning for angina.  Recent right and left cardiac cath in 04/2018 as outlined above.  Continue secondary prevention with aspirin, Lipitor, Coreg, and Imdur.  No plans for further ischemic evaluation at this time.  Should symptoms persist, could consider cardiopulmonary stress test.  2. HFpEF: Patient is approximately 10 to 14 pounds above dry weight though cannot exclude component of actual weight in the setting of relatively sedentary lifestyle.  His abdomen does remain distended.  Given this we have agreed to increase torsemide to 40 mg daily from his prior dose of 40 mg alternating with 20 mg on a daily basis.  He will also take metolazone 5 mg on an as-needed basis, no greater than 1 pill/week and when he does this he will take an extra potassium.  Check BMP today and again in 1  week.  Patient has a fine line of volume overload with dehydration which will need to be monitored closely.  3. BPPV: Improved.  Follow-up with PCP.  4. HTN: Blood pressure remains suboptimally controlled though improved.  Increase hydralazine to 50 mg twice daily.  Continue carvedilol, Imdur losartan, and torsemide.  Decrease amlodipine to 5 mg daily secondary to lower extremity swelling.  5. HLD: Most recent LDL of 54.  Remains on atorvastatin.  6. Possible COPD: Appears stable.  We did not discuss this during his visit today.  7. Obesity/sleep apnea: Patient does inquire if I could try and prescribe Saxenda as this was denied by his insurance company when attempted through his PCPs office.  I did notify him that this is outside of my scope of practice therefore will be unable to do so.  I told him I would reach out to his primary cardiologist and see if he would be willing to, if not, he will need to follow-up with PCP.  Disposition: F/u with Dr. Mariah Milling or an APP in 4 weeks.   Medication Adjustments/Labs and Tests Ordered: Current medicines are reviewed at length with the patient today.  Concerns regarding medicines are outlined above. Medication changes, Labs and Tests ordered today are summarized above and listed in the Patient Instructions accessible in Encounters.   Signed, Eula Listen, PA-C 03/20/2019 4:44 PM     [facility] [address] [place], [place] [address] [phone]

## 2019-03-18 ENCOUNTER — Other Ambulatory Visit: Payer: Self-pay | Admitting: Cardiovascular Disease

## 2019-03-20 ENCOUNTER — Other Ambulatory Visit: Payer: Self-pay

## 2019-03-20 ENCOUNTER — Ambulatory Visit (INDEPENDENT_AMBULATORY_CARE_PROVIDER_SITE_OTHER): Admitting: Physician Assistant

## 2019-03-20 ENCOUNTER — Other Ambulatory Visit
Admission: RE | Admit: 2019-03-20 | Discharge: 2019-03-20 | Disposition: A | Discharge status: Home / Self Care | Attending: Physician Assistant | Admitting: Physician Assistant

## 2019-03-20 ENCOUNTER — Encounter: Payer: Self-pay | Admitting: Physician Assistant

## 2019-03-20 VITALS — BP 150/80 | HR 66 | Ht 67.0 in | Wt 234.5 lb

## 2019-03-20 DIAGNOSIS — I10 Essential (primary) hypertension: Secondary | ICD-10-CM | POA: Diagnosis not present

## 2019-03-20 DIAGNOSIS — R42 Dizziness and giddiness: Secondary | ICD-10-CM | POA: Diagnosis not present

## 2019-03-20 DIAGNOSIS — Z79899 Other long term (current) drug therapy: Secondary | ICD-10-CM | POA: Insufficient documentation

## 2019-03-20 DIAGNOSIS — I25118 Atherosclerotic heart disease of native coronary artery with other forms of angina pectoris: Secondary | ICD-10-CM

## 2019-03-20 DIAGNOSIS — I5032 Chronic diastolic (congestive) heart failure: Secondary | ICD-10-CM

## 2019-03-20 DIAGNOSIS — I5033 Acute on chronic diastolic (congestive) heart failure: Secondary | ICD-10-CM | POA: Insufficient documentation

## 2019-03-20 DIAGNOSIS — Z6836 Body mass index (BMI) 36.0-36.9, adult: Secondary | ICD-10-CM

## 2019-03-20 DIAGNOSIS — E785 Hyperlipidemia, unspecified: Secondary | ICD-10-CM

## 2019-03-20 LAB — BASIC METABOLIC PANEL
Anion gap: 13 (ref 5–15)
BUN: 22 mg/dL (ref 8–23)
CO2: 22 mmol/L (ref 22–32)
Calcium: 9.6 mg/dL (ref 8.9–10.3)
Chloride: 106 mmol/L (ref 98–111)
Creatinine, Ser: 1.16 mg/dL (ref 0.61–1.24)
GFR calc Af Amer: >60 mL/min (ref >60)
GFR calc non Af Amer: >60 mL/min (ref >60)
Glucose, Bld: 98 mg/dL (ref 70–99)
Potassium: 3.9 mmol/L (ref 3.5–5.1)
Sodium: 141 mmol/L (ref 135–145)

## 2019-03-20 MED ORDER — HYDRALAZINE HCL 50 MG PO TABS: 50.0000 mg | ORAL_TABLET | Freq: Two times a day (BID) | ORAL | 2 refills | Status: DC

## 2019-03-20 MED ORDER — TORSEMIDE 20 MG PO TABS: 40.0000 mg | ORAL_TABLET | Freq: Two times a day (BID) | ORAL | 3 refills | Status: DC

## 2019-03-20 MED ORDER — METOLAZONE 5 MG PO TABS: 5.0000 mg | ORAL_TABLET | ORAL | 3 refills | Status: DC

## 2019-03-20 MED ORDER — AMLODIPINE BESYLATE 5 MG PO TABS: 5.0000 mg | ORAL_TABLET | Freq: Every day | ORAL | 2 refills | Status: DC

## 2019-03-20 NOTE — Patient Instructions (Addendum)
Medication Instructions:  Your physician has recommended you make the following change in your medication:  1- DECREASE Amlodipine to 5 mg by mouth daily. 2- INCREASE Hydralazine to 50 mg by mouth two times a day. 3- INCREASE Torsemide to 40 mg (2 tablets) by mouth two times a day. 4- TAKE Metolazone 5 mg by mouth once a week.  If you need a refill on your cardiac medications before your next appointment, please call your pharmacy.   Lab work: Your physician recommends that you return for lab work in: [place] at the [place]. Please go to the [facility]. You will check in at the front desk to the right as you walk into the atrium. Valet Parking is offered if needed.   If you have labs (blood work) drawn today and your tests are completely normal, you will receive your results only by: Marland Kitchen MyChart Message (if you have MyChart) OR . A paper copy in the mail If you have any lab test that is abnormal or we need to change your treatment, we will call you to review the results.  Testing/Procedures: NONE  Follow-Up: At [facility], you and your health needs are our priority.  As part of our continuing mission to provide you with exceptional heart care, we have created designated Provider Care Teams.  These Care Teams include your primary Cardiologist (physician) and Advanced Practice Providers (APPs -  Physician Assistants and Nurse Practitioners) who all work together to provide you with the care you need, when you need it. You will need a follow up appointment in 4 weeks with Thurmond Butts or Dr Fletcher Anon.  You may see Kathlyn Sacramento, MD or one of the following Advanced Practice Providers on your designated Care Team:   Murray Hodgkins, NP Christell Faith, PA-C . Marrianne Mood, PA-C

## 2019-03-21 ENCOUNTER — Telehealth: Payer: Self-pay

## 2019-03-21 DIAGNOSIS — I5032 Chronic diastolic (congestive) heart failure: Secondary | ICD-10-CM

## 2019-03-21 NOTE — Telephone Encounter (Signed)
-----   Message from Rise Mu, PA-C sent at 03/20/2019  2:09 PM EDT ----- Kidney function normal. Potassium improved. Continue with planned increase of torsemide to 40 mg twice daily and metolazone 5 mg as needed, no more than once per week. When he takes a metolazone he needs to take an extra potassium. Recheck BMP in 1 week to assess potassium and renal function.

## 2019-03-21 NOTE — Telephone Encounter (Signed)
Call to patient to discuss POC from Christell Faith, [place].  Pt verbalized understanding and cooperative with POC.  [facility] for [facility].   Advised pt to call for any further questions or concerns.

## 2019-03-27 ENCOUNTER — Other Ambulatory Visit: Payer: Self-pay

## 2019-03-27 ENCOUNTER — Ambulatory Visit
Admission: RE | Admit: 2019-03-27 | Discharge: 2019-03-27 | Disposition: A | Discharge status: Home / Self Care | Source: Ambulatory Visit | Attending: Physician Assistant | Admitting: Physician Assistant

## 2019-03-27 DIAGNOSIS — R918 Other nonspecific abnormal finding of lung field: Secondary | ICD-10-CM | POA: Insufficient documentation

## 2019-03-29 ENCOUNTER — Telehealth: Payer: Self-pay

## 2019-03-29 DIAGNOSIS — J9811 Atelectasis: Secondary | ICD-10-CM

## 2019-03-29 MED ORDER — UMECLIDINIUM-VILANTEROL 62.5-25 MCG/INH IN AEPB: 1.0000 | INHALATION_SPRAY | Freq: Every day | RESPIRATORY_TRACT | 11 refills | Status: DC

## 2019-03-29 NOTE — Telephone Encounter (Signed)
Patient advised as directed below. Patient is also requesting a prescription on the following medication Anoro 625mg  reports that he was given samples and would like a prescription send in to the pharmacy please.  Pharmacy:[facility]

## 2019-03-29 NOTE — Telephone Encounter (Signed)
Sent in

## 2019-03-29 NOTE — Telephone Encounter (Signed)
-----   Message from Mar Daring, PA-C sent at 03/29/2019  9:32 AM EDT ----- CT is unremarkable. The spot noticed on the CXR is just a bony island noted on the 5th rib. This is a benign finding and no further work up is needed.

## 2019-04-16 NOTE — Progress Notes (Signed)
Cardiology Office Note  Date:  04/18/2019   ID:  Jonathon Newman, DOB 08/11/1955, MRN [id number]  PCP:  Margaretann LovelessBurnette, [name], PA-C   Chief Complaint  Patient presents with  . Other    4 week follow up. Patient c/o SOB and swelling in ankles. Meds reviewed verbally with patient.     HPI:  [name] is a 63 year old gentleman with past medical history of coronary artery disease  chronic diastolic heart failure.   Cypher drug-eluting stent placement to the RCA in 2010.    two-vessel coronary artery bypass [facility].   Hyperlipidemia Who presents for follow-up of his chronic diastolic CHF,   Last seen October 2019 Had fluid retention weight gain  recommendation for torsemide 40 twice daily For weight greater than 230 would take metolazone 5 For weight greater than 227 would take metolazone 2.5  diagnostic R/LHC on 05/02/2018 that showed significant underlying left main disease with patent grafts including LIMA to LAD and SVG to OM2. The RCA stent was patent with minimal restenosis. He had normal LVSF with a mildly elevated LVEDP at 14-15 mmHg.   02/2019 to the ER for chest pressure 4 days Ruled out, added hydralazine  Takes torsemide 20 BID High fluid intake, up 3 times a night  Takes metolazone 5 mg every Sunday  Feels that his ideal weight is between 223 to 231 pounds  Looking for weight loss pill or supplement Goes to [employer] every week 1 pill was called in and insurance company denied it  Dizzy, nausea,tachycardia possibly from medications Started   He is wearing his C-PAP.  EKG personally reviewed by myself on todays visit Shows normal sinus rhythm rate 73 bpm old inferior MI no significant ST or T wave changes  PMH:   has a past medical history of Arthritis, CHF (congestive heart failure) (HCC), Coronary artery disease, Dyspnea, GERD (gastroesophageal reflux disease), Hypercholesteremia, Hypertension, S/P coronary artery bypass graft x 2,  and Sleep apnea.  PSH:    Past Surgical History:  Procedure Laterality Date  . APPENDECTOMY    . BACK SURGERY  11/01/2017   SL 5 and S1  . CARDIAC CATHETERIZATION    . CARPAL TUNNEL RELEASE     left hand  . CHOLECYSTECTOMY    . CHOLECYSTECTOMY    . CORONARY ARTERY BYPASS GRAFT  04/21/2015   CABG x 2 [place] [facility] LIMA to LAD amd SVG to OM2  . CORONARY STENT PLACEMENT  2010   Cordis Cypher Sirolimus-eluting stent 2.50 mm x 26 mm [facility] RCA [facility]   . ESOPHAGEAL MANOMETRY N/A 08/04/2017   Procedure: ESOPHAGEAL MANOMETRY (EM);  Surgeon: Toney ReilVanga, Rohini Reddy, MD;  Location: [facility] ENDOSCOPY;  Service: Endoscopy;  Laterality: N/A;  . FRACTURE SURGERY    . KNEE SURGERY    . KNEE SURGERY     right knee   . LAMINECTOMY     "plate in neck E9-B2C3-C4"  . LEFT HEART CATH AND CORS/GRAFTS ANGIOGRAPHY N/A 05/02/2018   Procedure: CORS/GRAFTS ANGIOGRAPHY;  Surgeon: Iran OuchArida, Muhammad A, MD;  Location: [facility] INVASIVE CV LAB;  Service: Cardiovascular;  Laterality: N/A;  . RIGHT/LEFT HEART CATH AND CORONARY ANGIOGRAPHY Bilateral 05/02/2018   Procedure: LEFT HEART CATH;  Surgeon: Iran OuchArida, Muhammad A, MD;  Location: [facility] INVASIVE CV LAB;  Service: Cardiovascular;  Laterality: Bilateral;  . VASECTOMY      Current Outpatient Medications  Medication Sig Dispense Refill  . amLODipine (NORVASC) 5 MG tablet  Take 1 tablet (5 mg total) by mouth 2 (two) times daily. 180 tablet 3  . aspirin 81 MG tablet Take 81 mg by mouth daily.    Marland Kitchen atorvastatin (LIPITOR) 40 MG tablet TAKE ONE TABLET BY MOUTH DAILY 90 tablet 0  . carvedilol (COREG) 25 MG tablet TAKE ONE TABLET BY MOUTH TWICE A DAY WITH MEALS 180 tablet 1  . hydrALAZINE (APRESOLINE) 50 MG tablet Take 1 tablet (50 mg total) by mouth 2 (two) times daily. 60 tablet 2  . isosorbide mononitrate (IMDUR) 60 MG 24 hr tablet Take 1 tablet (60 mg total) by mouth daily. 90 tablet 0  . losartan (COZAAR) 100 MG tablet TAKE ONE TABLET BY MOUTH DAILY  90 tablet 1  . metolazone (ZAROXOLYN) 5 MG tablet Take 1 tablet (5 mg total) by mouth once a week. 90 tablet 3  . Multiple Vitamin (MULTIVITAMIN WITH MINERALS) TABS tablet Take 1 tablet by mouth daily.    . naproxen sodium (ALEVE) 220 MG tablet Take 440 mg by mouth 2 (two) times daily as needed (mild pain).    . pantoprazole (PROTONIX) 20 MG tablet TAKE TWO TABLETS BY MOUTH TWICE A DAY 360 tablet 1  . potassium chloride (KLOR-CON) 10 MEQ tablet TAKE ONE TABLET BY MOUTH TWICE A DAY AS NEEDED 180 tablet 0  . Sennosides (SENNA-EX PO) Take 1 tablet by mouth 2 (two) times daily.     . sildenafil (VIAGRA) 100 MG tablet Take 100 mg by mouth daily as needed for erectile dysfunction.    . torsemide (DEMADEX) 20 MG tablet Take 2 tablets (40 mg total) by mouth 2 (two) times daily. 120 tablet 3  . umeclidinium-vilanterol (ANORO ELLIPTA) 62.5-25 MCG/INH AEPB Inhale 1 puff into the lungs daily. 60 each 11   No current facility-administered medications for this visit.      Allergies:   Lisinopril   Social History:  The patient  reports that he quit smoking about 13 years ago. His smoking use included cigarettes. He has a 0.50 pack-year smoking history. He has quit using smokeless tobacco.  His smokeless tobacco use included chew. He reports that he does not drink alcohol or use drugs.   Family History:   family history includes Arthritis in his mother; Cancer in his maternal grandfather; Cataracts in his maternal grandmother; Glaucoma in his maternal grandmother; Heart attack in his paternal grandfather; Heart attack (age of onset: 28) in his father; Heart murmur in his brother; Hyperlipidemia in his father; Hypertension in his father and paternal grandfather; Valvular heart disease in his brother.    Review of Systems: Review of Systems  Constitutional: Negative.        Weight gain  Respiratory: Negative.   Cardiovascular: Positive for palpitations.  Gastrointestinal: Positive for nausea.   Musculoskeletal: Negative.   Neurological: Positive for dizziness.  Psychiatric/Behavioral: Negative.   All other systems reviewed and are negative.    PHYSICAL EXAM: VS:  BP 134/74 (BP Location: Left Arm, Patient Position: Sitting, Cuff Size: Normal)   Pulse 72   Ht 5\' 7"  (1.702 m)   Wt 231 lb (104.8 kg)   BMI 36.18 kg/m  , BMI Body mass index is 36.18 kg/m. Constitutional:  oriented to person, place, and time. No distress.  HENT:  Head: Grossly normal Eyes:  no discharge. No scleral icterus.  Neck: No JVD, no carotid bruits  Cardiovascular: Regular rate and rhythm, no murmurs appreciated Pulmonary/Chest: Clear to auscultation bilaterally, no wheezes or rails Abdominal: Soft.  no distension.  no tenderness.  Musculoskeletal: Normal range of motion Neurological:  normal muscle tone. Coordination normal. No atrophy Skin: Skin warm and dry Psychiatric: normal affect, pleasant   Recent Labs: 03/10/2019: ALT 68 03/11/2019: Hemoglobin 13.0; Platelets 164 03/20/2019: BUN 22; Creatinine, Ser 1.16; Potassium 3.9; Sodium 141    Lipid Panel Lab Results  Component Value Date   CHOL 185 02/02/2018   HDL 35 (L) 02/02/2018   LDLCALC Comment 02/02/2018   TRIG 692 (HH) 02/02/2018      Wt Readings from Last 3 Encounters:  04/18/19 231 lb (104.8 kg)  03/20/19 234 lb 8 oz (106.4 kg)  03/15/19 233 lb (105.7 kg)      ASSESSMENT AND PLAN:  Acute on chronic diastolic congestive heart failure (HCC) -  Weight likely around his goal 223 up to 231 Regulates it with torsemide 20 twice daily and metolazone once a week Very high fluid intake, discussed with him that he may need to moderate his intake  Coronary artery disease involving native coronary artery of native heart with angina pectoris ([place]) Currently with no symptoms of angina. No further workup at this time. Continue current medication regimen. Cardiac catheterization results reviewed with him in detail  S/P coronary artery  bypass graft x 2 - Plan: EKG 12-Lead No further ischemic work-up needed  Hyperlipidemia, unspecified hyperlipidemia type Cholesterol above goal, needs statin with Zetia  Essential hypertension Long discussion, thinks he is having side effects from the hydralazine nausea dizziness etc. Tachycardia We will wean off the hydralazine, increase amlodipine back to 5 twice daily Extra isosorbide 30 mg for high blood pressure Reports it is sometimes labile Feels the dizziness nausea tachycardia is reaction to one of his pills possibly hydralazine Extra half carvedilol as needed for tachycardia  Chronic dizziness Concerning for vertigo but was told by ENT this was not the case Reports having extensive ENT work-up in the past and by his report was negative We previously suggested meclizine  Disposition:   F/U with 3 months  Long discussion concerning various medication options available, etiology of his dizziness, tachycardia, nausea,   Total encounter time more than 45 minutes  Greater than 50% was spent in counseling and coordination of care with the patient    Orders Placed This Encounter  Procedures  . EKG 12-Lead     Signed, Esmond Plants, M.D., Ph.D. 04/18/2019  [place], [place]

## 2019-04-18 ENCOUNTER — Encounter: Payer: Self-pay | Admitting: Cardiovascular Disease

## 2019-04-18 ENCOUNTER — Ambulatory Visit (INDEPENDENT_AMBULATORY_CARE_PROVIDER_SITE_OTHER): Admitting: Cardiovascular Disease

## 2019-04-18 ENCOUNTER — Other Ambulatory Visit: Payer: Self-pay

## 2019-04-18 VITALS — BP 134/74 | HR 72 | Ht 67.0 in | Wt 231.0 lb

## 2019-04-18 DIAGNOSIS — I5032 Chronic diastolic (congestive) heart failure: Secondary | ICD-10-CM | POA: Diagnosis not present

## 2019-04-18 DIAGNOSIS — I10 Essential (primary) hypertension: Secondary | ICD-10-CM

## 2019-04-18 DIAGNOSIS — E785 Hyperlipidemia, unspecified: Secondary | ICD-10-CM

## 2019-04-18 DIAGNOSIS — I25118 Atherosclerotic heart disease of native coronary artery with other forms of angina pectoris: Secondary | ICD-10-CM

## 2019-04-18 DIAGNOSIS — R42 Dizziness and giddiness: Secondary | ICD-10-CM

## 2019-04-18 DIAGNOSIS — Z6836 Body mass index (BMI) 36.0-36.9, adult: Secondary | ICD-10-CM

## 2019-04-18 MED ORDER — AMLODIPINE BESYLATE 5 MG PO TABS: 5.0000 mg | ORAL_TABLET | Freq: Two times a day (BID) | ORAL | 3 refills | Status: DC

## 2019-04-18 NOTE — Patient Instructions (Addendum)
Hold the hydralazine  Increase the amlodipine up to 5 twice a day OK to take extra 1/2 dose imdur/isosorbid as needed Extra 1/2 coreg for tachycardia  Please also consider Zetia for cholesterol.   Medication Instructions:  No changes  If you need a refill on your cardiac medications before your next appointment, please call your pharmacy.    Lab work: No new labs needed   If you have labs (blood work) drawn today and your tests are completely normal, you will receive your results only by: Marland Kitchen MyChart Message (if you have MyChart) OR . A paper copy in the mail If you have any lab test that is abnormal or we need to change your treatment, we will call you to review the results.   Testing/Procedures: No new testing needed   Follow-Up: At [facility], you and your health needs are our priority.  As part of our continuing mission to provide you with exceptional heart care, we have created designated Provider Care Teams.  These Care Teams include your primary Cardiologist (physician) and Advanced Practice Providers (APPs -  Physician Assistants and Nurse Practitioners) who all work together to provide you with the care you need, when you need it.  . You will need a follow up appointment in 3 months   . Providers on your designated Care Team:   . Murray Hodgkins, NP . Christell Faith, PA-C . Marrianne Mood, PA-C  Any Other Special Instructions Will Be Listed Below (If Applicable).  For educational health videos Log in to : [email or web address] Or : [email or web address], password : [email or web address]

## 2019-04-28 ENCOUNTER — Other Ambulatory Visit: Payer: Self-pay | Admitting: Physician Assistant

## 2019-05-09 ENCOUNTER — Other Ambulatory Visit: Payer: Self-pay | Admitting: Physician Assistant

## 2019-05-09 DIAGNOSIS — S96919A Strain of unspecified muscle and tendon at ankle and foot level, unspecified foot, initial encounter: Secondary | ICD-10-CM | POA: Insufficient documentation

## 2019-05-16 ENCOUNTER — Other Ambulatory Visit: Payer: Self-pay | Admitting: Physician Assistant

## 2019-05-16 DIAGNOSIS — I10 Essential (primary) hypertension: Secondary | ICD-10-CM

## 2019-05-16 DIAGNOSIS — I25118 Atherosclerotic heart disease of native coronary artery with other forms of angina pectoris: Secondary | ICD-10-CM

## 2019-05-27 ENCOUNTER — Other Ambulatory Visit: Payer: Self-pay | Admitting: Physician Assistant

## 2019-06-12 NOTE — Progress Notes (Signed)
Patient: Jonathon Newman Male    DOB: 1956-05-23   63 y.o.   MRN: [id number] Visit Date: 06/13/2019  Today's Provider: Mar Daring, PA-C   Chief Complaint  Patient presents with  . Follow-up   Subjective:     HPI Patient her today to follow up on left foot pain, due to a fall. Patient has seen Dr. Vickki Muff on 05/25/2019. Patient reports uric acid was checked and was elevated, uric acid was 12.7. It is suspected his left foot pain is more gouty arthritis instead of strain as initially thought. He has had 2 rounds of prednisone and a steroid injection. The injection helped some, but pain has started to return again.    Allergies  Allergen Reactions  . Lisinopril Cough     Current Outpatient Medications:  .  amLODipine (NORVASC) 5 MG tablet, Take 1 tablet (5 mg total) by mouth 2 (two) times daily., Disp: 180 tablet, Rfl: 3 .  aspirin 81 MG tablet, Take 81 mg by mouth daily., Disp: , Rfl:  .  atorvastatin (LIPITOR) 40 MG tablet, TAKE ONE TABLET BY MOUTH DAILY, Disp: 90 tablet, Rfl: 3 .  carvedilol (COREG) 25 MG tablet, TAKE ONE TABLET BY MOUTH TWICE A DAY WITH MEALS, Disp: 180 tablet, Rfl: 0 .  isosorbide mononitrate (IMDUR) 60 MG 24 hr tablet, Take 1 tablet (60 mg total) by mouth daily., Disp: 90 tablet, Rfl: 0 .  losartan (COZAAR) 100 MG tablet, TAKE ONE TABLET BY MOUTH DAILY, Disp: 90 tablet, Rfl: 1 .  metolazone (ZAROXOLYN) 5 MG tablet, Take 1 tablet (5 mg total) by mouth once a week., Disp: 90 tablet, Rfl: 3 .  Multiple Vitamin (MULTIVITAMIN WITH MINERALS) TABS tablet, Take 1 tablet by mouth daily., Disp: , Rfl:  .  naproxen sodium (ALEVE) 220 MG tablet, Take 440 mg by mouth 2 (two) times daily as needed (mild pain)., Disp: , Rfl:  .  pantoprazole (PROTONIX) 20 MG tablet, TAKE TWO TABLETS BY MOUTH TWICE A DAY, Disp: 360 tablet, Rfl: 1 .  potassium chloride (KLOR-CON) 10 MEQ tablet, TAKE ONE TABLET BY MOUTH TWICE A DAY AS NEEDED, Disp: 180 tablet, Rfl: 0 .  Sennosides  (SENNA-EX PO), Take 1 tablet by mouth 2 (two) times daily. , Disp: , Rfl:  .  sildenafil (VIAGRA) 100 MG tablet, Take 100 mg by mouth daily as needed for erectile dysfunction., Disp: , Rfl:  .  torsemide (DEMADEX) 20 MG tablet, Take 2 tablets (40 mg total) by mouth 2 (two) times daily., Disp: 120 tablet, Rfl: 3  Review of Systems  Constitutional: Negative.   Respiratory: Negative.   Cardiovascular: Negative.   Gastrointestinal: Negative.   Musculoskeletal: Positive for arthralgias, gait problem and joint swelling.  Neurological: Negative for weakness.    Social History   Tobacco Use  . Smoking status: Former Smoker    Packs/day: 0.25    Years: 2.00    Pack years: 0.50    Types: Cigarettes    Quit date: 05/26/2005    Years since quitting: 14.0  . Smokeless tobacco: Former Systems developer    Types: Chew  Substance Use Topics  . Alcohol use: No      Objective:   BP 122/68 (BP Location: Left Arm, Patient Position: Sitting, Cuff Size: Large)   Pulse 75   Temp (!) 97.3 F (36.3 C) (Temporal)   Wt 236 lb (107 kg)   BMI 36.96 kg/m  Vitals:   06/13/19 1507  BP: 122/68  Pulse: 75  Temp: (!) 97.3 F (36.3 C)  TempSrc: Temporal  Weight: 236 lb (107 kg)  Body mass index is 36.96 kg/m.   Physical Exam Vitals reviewed.  Constitutional:      General: He is not in acute distress.    Appearance: Normal appearance. He is well-developed. He is obese. He is not ill-appearing.  HENT:     Head: Normocephalic and atraumatic.  Eyes:     General: No scleral icterus.    Conjunctiva/sclera: Conjunctivae normal.  Pulmonary:     Effort: Pulmonary effort is normal. No respiratory distress.  Musculoskeletal:     Cervical back: Normal range of motion and neck supple.  Neurological:     Mental Status: He is alert.  Psychiatric:        Mood and Affect: Mood normal.        Behavior: Behavior normal.        Thought Content: Thought content normal.        Judgment: Judgment normal.      No  results found for any visits on 06/13/19.     Assessment & Plan    1. Acute idiopathic gout of left ankle Will start allopurinol as below. Colchicine also sent in for prn use during flares and may be needed with starting the allopurinol. Patient was advised of this. He is to hold his atorvastatin and carvedilol on days he requires colchicine. He voices understanding. Will have him return in 4-6 weeks for labs only to check his uric acid level on allopurinol. Will increase if still not to goal below 6.  - allopurinol (ZYLOPRIM) 100 MG tablet; Take 1 tablet (100 mg total) by mouth daily.  Dispense: 30 tablet; Refill: 6 - colchicine 0.6 MG tablet; Take 2 tablets at onset of gout and then 1 tab PO q 12 hrs until pain subsides, repeat as needed  Dispense: 60 tablet; Refill: 5 - Uric acid; Future  2. Prediabetes Known history and has had multiple steroid rounds recently. Also now having some increased thirst. Will check his A1c also when he returns for uric acid lab as noted above.  - HgB A1c; Future     Margaretann Loveless, PA-C  [facility]

## 2019-06-13 ENCOUNTER — Other Ambulatory Visit: Payer: Self-pay

## 2019-06-13 ENCOUNTER — Ambulatory Visit (INDEPENDENT_AMBULATORY_CARE_PROVIDER_SITE_OTHER): Admitting: Physician Assistant

## 2019-06-13 ENCOUNTER — Encounter: Payer: Self-pay | Admitting: Physician Assistant

## 2019-06-13 VITALS — BP 122/68 | HR 75 | Temp 97.3°F | Wt 236.0 lb

## 2019-06-13 DIAGNOSIS — R7303 Prediabetes: Secondary | ICD-10-CM

## 2019-06-13 DIAGNOSIS — M10072 Idiopathic gout, left ankle and foot: Secondary | ICD-10-CM

## 2019-06-13 MED ORDER — POTASSIUM CHLORIDE ER 10 MEQ PO TBCR: 10.0000 meq | EXTENDED_RELEASE_TABLET | Freq: Two times a day (BID) | ORAL | 0 refills | Status: DC | PRN

## 2019-06-13 MED ORDER — ALLOPURINOL 100 MG PO TABS: 100.0000 mg | ORAL_TABLET | Freq: Every day | ORAL | 6 refills | Status: DC

## 2019-06-13 MED ORDER — COLCHICINE 0.6 MG PO TABS: ORAL_TABLET | ORAL | 5 refills | Status: DC

## 2019-06-13 NOTE — Patient Instructions (Signed)
Gout  Gout is a condition that causes painful swelling of the joints. Gout is a type of inflammation of the joints (arthritis). This condition is caused by having too much uric acid in the body. Uric acid is a chemical that forms when the body breaks down substances called purines. Purines are important for building body proteins. When the body has too much uric acid, sharp crystals can form and build up inside the joints. This causes pain and swelling. Gout attacks can happen quickly and may be very painful (acute gout). Over time, the attacks can affect more joints and become more frequent (chronic gout). Gout can also cause uric acid to build up under the skin and inside the kidneys. What are the causes? This condition is caused by too much uric acid in your blood. This can happen because:  Your kidneys do not remove enough uric acid from your blood. This is the most common cause.  Your body makes too much uric acid. This can happen with some cancers and cancer treatments. It can also occur if your body is breaking down too many red blood cells (hemolytic anemia).  You eat too many foods that are high in purines. These foods include organ meats and some seafood. Alcohol, especially beer, is also high in purines. A gout attack may be triggered by trauma or stress. What increases the risk? You are more likely to develop this condition if you:  Have a family history of gout.  Are male and middle-aged.  Are male and have gone through menopause.  Are obese.  Frequently drink alcohol, especially beer.  Are dehydrated.  Lose weight too quickly.  Have an organ transplant.  Have lead poisoning.  Take certain medicines, including aspirin, cyclosporine, diuretics, levodopa, and niacin.  Have kidney disease.  Have a skin condition called psoriasis. What are the signs or symptoms? An attack of acute gout happens quickly. It usually occurs in just one joint. The most common place is  the big toe. Attacks often start at night. Other joints that may be affected include joints of the feet, ankle, knee, fingers, wrist, or elbow. Symptoms of this condition may include:  Severe pain.  Warmth.  Swelling.  Stiffness.  Tenderness. The affected joint may be very painful to touch.  Shiny, red, or purple skin.  Chills and fever. Chronic gout may cause symptoms more frequently. More joints may be involved. You may also have white or yellow lumps (tophi) on your hands or feet or in other areas near your joints. How is this diagnosed? This condition is diagnosed based on your symptoms, medical history, and physical exam. You may have tests, such as:  Blood tests to measure uric acid levels.  Removal of joint fluid with a thin needle (aspiration) to look for uric acid crystals.  X-rays to look for joint damage. How is this treated? Treatment for this condition has two phases: treating an acute attack and preventing future attacks. Acute gout treatment may include medicines to reduce pain and swelling, including:  NSAIDs.  Steroids. These are strong anti-inflammatory medicines that can be taken by mouth (orally) or injected into a joint.  Colchicine. This medicine relieves pain and swelling when it is taken soon after an attack. It can be given by mouth or through an IV. Preventive treatment may include:  Daily use of smaller doses of NSAIDs or colchicine.  Use of a medicine that reduces uric acid levels in your blood.  Changes to your diet. You may   need to see a dietitian about what to eat and drink to prevent gout. Follow these instructions at home: During a gout attack   If directed, put ice on the affected area: ? Put ice in a plastic bag. ? Place a towel between your skin and the bag. ? Leave the ice on for 20 minutes, 2-3 times a day.  Raise (elevate) the affected joint above the level of your heart as often as possible.  Rest the joint as much as possible.  If the affected joint is in your leg, you may be given crutches to use.  Follow instructions from your health care provider about eating or drinking restrictions. Avoiding future gout attacks  Follow a low-purine diet as told by your dietitian or health care provider. Avoid foods and drinks that are high in purines, including liver, kidney, anchovies, asparagus, herring, mushrooms, mussels, and beer.  Maintain a healthy weight or lose weight if you are overweight. If you want to lose weight, talk with your health care provider. It is important that you do not lose weight too quickly.  Start or maintain an exercise program as told by your health care provider. Eating and drinking  Drink enough fluids to keep your urine pale yellow.  If you drink alcohol: ? Limit how much you use to:  0-1 drink a day for women.  0-2 drinks a day for men. ? Be aware of how much alcohol is in your drink. In the [place] one drink equals one 12 oz bottle of beer (355 mL) one 5 oz glass of wine (148 mL), or one 1 oz glass of hard liquor (44 mL). General instructions  Take over-the-counter and prescription medicines only as told by your health care provider.  Do not drive or use heavy machinery while taking prescription pain medicine.  Return to your normal activities as told by your health care provider. Ask your health care provider what activities are safe for you.  Keep all follow-up visits as told by your health care provider. This is important. Contact a health care provider if you have:  Another gout attack.  Continuing symptoms of a gout attack after 10 days of treatment.  Side effects from your medicines.  Chills or a fever.  Burning pain when you urinate.  Pain in your lower back or belly. Get help right away if you:  Have severe or uncontrolled pain.  Cannot urinate. Summary  Gout is painful swelling of the joints caused by inflammation.  The most common site of pain is the big  toe, but it can affect other joints in the body.  Medicines and dietary changes can help to prevent and treat gout attacks. This information is not intended to replace advice given to you by your health care provider. Make sure you discuss any questions you have with your health care provider. Document Released: 05/29/2000 Document Revised: 12/22/2017 Document Reviewed: 12/22/2017 Elsevier Patient Education  [place] A low-purine eating plan involves making food choices to limit your intake of purine. Purine is a kind of uric acid. Too much uric acid in your blood can cause certain conditions, such as gout and kidney stones. Eating a low-purine diet can help control these conditions. What are tips for following this plan? Reading food labels   Avoid foods with saturated or Trans fat.  Check the ingredient list of grains-based foods, such as bread and cereal, to make sure that they contain whole grains.  Check  the ingredient list of sauces or soups to make sure they do not contain meat or fish.  When choosing soft drinks, check the ingredient list to make sure they do not contain high-fructose corn syrup. Shopping  Buy plenty of fresh fruits and vegetables.  Avoid buying canned or fresh fish.  Buy dairy products labeled as low-fat or nonfat.  Avoid buying premade or processed foods. These foods are often high in fat, salt (sodium), and added sugar. Cooking  Use olive oil instead of butter when cooking. Oils like olive oil, canola oil, and sunflower oil contain healthy fats. Meal planning  Learn which foods do or do not affect you. If you find out that a food tends to cause your gout symptoms to flare up, avoid eating that food. You can enjoy foods that do not cause problems. If you have any questions about a food item, talk with your dietitian or health care provider.  Limit foods high in fat, especially saturated fat. Fat makes it harder for  your body to get rid of uric acid.  Choose foods that are lower in fat and are lean sources of protein. General guidelines  Limit alcohol intake to no more than 1 drink a day for nonpregnant women and 2 drinks a day for men. One drink equals 12 oz of beer, 5 oz of wine, or 1 oz of hard liquor. Alcohol can affect the way your body gets rid of uric acid.  Drink plenty of water to keep your urine clear or pale yellow. Fluids can help remove uric acid from your body.  If directed by your health care provider, take a vitamin C supplement.  Work with your health care provider and dietitian to develop a plan to achieve or maintain a healthy weight. Losing weight can help reduce uric acid in your blood. What foods are recommended? The items listed may not be a complete list. Talk with your dietitian about what dietary choices are best for you. Foods low in purines Foods low in purines do not need to be limited. These include:  All fruits.  All low-purine vegetables, pickles, and olives.  Breads, pasta, rice, cornbread, and popcorn. Cake and other baked goods.  All dairy foods.  Eggs, nuts, and nut butters.  Spices and condiments, such as salt, herbs, and vinegar.  Plant oils, butter, and margarine.  Water, sugar-free soft drinks, tea, coffee, and cocoa.  Vegetable-based soups, broths, sauces, and gravies. Foods moderate in purines Foods moderate in purines should be limited to the amounts listed.   cup of asparagus, cauliflower, spinach, mushrooms, or green peas, each day.  2/3 cup uncooked oatmeal, each day.   cup dry wheat bran or wheat germ, each day.  2-3 ounces of meat or poultry, each day.  4-6 ounces of shellfish, such as crab, lobster, oysters, or shrimp, each day.  1 cup cooked beans, peas, or lentils, each day.  Soup, broths, or bouillon made from meat or fish. Limit these foods as much as possible. What foods are not recommended? The items listed may not be a  complete list. Talk with your dietitian about what dietary choices are best for you. Limit your intake of foods high in purines, including:  Beer and other alcohol.  Meat-based gravy or sauce.  Canned or fresh fish, such as: ? Anchovies, sardines, herring, and tuna. ? Mussels and scallops. ? Codfish, trout, and haddock.  Berniece Salines.  Organ meats, such as: ? Liver or kidney. ? Tripe. ? Sweetbreads (thymus  gland or pancreas).  [employer] officer, environmental.  Yeast or yeast extract supplements.  Drinks sweetened with high-fructose corn syrup. Summary  Eating a low-purine diet can help control conditions caused by too much uric acid in the body, such as gout or kidney stones.  Choose low-purine foods, limit alcohol, and limit foods high in fat.  You will learn over time which foods do or do not affect you. If you find out that a food tends to cause your gout symptoms to flare up, avoid eating that food. This information is not intended to replace advice given to you by your health care provider. Make sure you discuss any questions you have with your health care provider. Document Released: 09/26/2010 Document Revised: 05/14/2017 Document Reviewed: 07/15/2016 Elsevier Patient Education  2020 [employer].    Allopurinol tablets What is this medicine? ALLOPURINOL (al oh PURE i nole) reduces the amount of uric acid the body makes. It is used to treat the symptoms of gout. It is also used to treat or prevent high uric acid levels that occur as a result of certain types of chemotherapy. This medicine may also help patients who frequently have kidney stones. This medicine may be used for other purposes; ask your health care provider or pharmacist if you have questions. COMMON BRAND NAME(S): Zyloprim What should I tell my health care provider before I take this medicine? They need to know if you have any of these conditions:  kidney disease  liver disease  an unusual or allergic reaction to  allopurinol, other medicines, foods, dyes, or preservatives  pregnant or trying to get pregnant  breast feeding How should I use this medicine? Take this medicine by mouth with a glass of water. Follow the directions on the prescription label. If this medicine upsets your stomach, take it with food or milk. Take your doses at regular intervals. Do not take your medicine more often than directed. Talk to your pediatrician regarding the use of this medicine in children. Special care may be needed. While this drug may be prescribed for children as young as 6 years for selected conditions, precautions do apply. Overdosage: If you think you have taken too much of this medicine contact a poison control center or emergency room at once. NOTE: This medicine is only for you. Do not share this medicine with others. What if I miss a dose? If you miss a dose, take it as soon as you can. If it is almost time for your next dose, take only that dose. Do not take double or extra doses. What may interact with this medicine? Do not take this medicine with the following medication:  didanosine, ddI This medicine may also interact with the following medications:  certain antibiotics like amoxicillin, ampicillin  certain medicines for cancer  certain medicines for immunosuppression like azathioprine, cyclosporine, mercaptopurine  chlorpropamide  probenecid  thiazide diuretics, like hydrochlorothiazide  sulfinpyrazone  warfarin This list may not describe all possible interactions. Give your health care provider a list of all the medicines, herbs, non-prescription drugs, or dietary supplements you use. Also tell them if you smoke, drink alcohol, or use illegal drugs. Some items may interact with your medicine. What should I watch for while using this medicine? Visit your doctor or healthcare provider for regular checks on your progress. If you are taking this medicine to treat gout, you may not have less  frequent attacks at first. Keep taking your medicine regularly and the attacks should get better within 2  to 6 weeks. Drink plenty of water (10 to 12 full glasses a day) while you are taking this medicine. This will help to reduce stomach upset and reduce the risk of getting gout or kidney stones. Call your doctor or healthcare provider at once if you get a skin rash together with chills, fever, sore throat, or nausea and vomiting, if you have blood in your urine, or difficulty passing urine. This medicine may cause serious skin reactions. They can happen weeks to months after starting the medicine. Contact your healthcare provider right away if you notice fevers or flu-like symptoms with a rash. The rash may be red or purple and then turn into blisters or peeling of the skin. Or, you might notice a red rash with swelling of the face, lips or lymph nodes in your neck or under your arms. Do not take vitamin C without asking your doctor or healthcare provider. Too much vitamin C can increase the chance of getting kidney stones. You may get drowsy or dizzy. Do not drive, use machinery, or do anything that needs mental alertness until you know how this drug affects you. Do not stand or sit up quickly, especially if you are an older patient. This reduces the risk of dizzy or fainting spells. Alcohol can make you more drowsy and dizzy. Alcohol can also increase the chance of stomach problems and increase the amount of uric acid in your blood. Avoid alcoholic drinks. What side effects may I notice from receiving this medicine? Side effects that you should report to your doctor or health care professional as soon as possible:  allergic reactions like skin rash, itching or hives, swelling of the face, lips, or tongue  breathing problems  joint pain  muscle pain  rash, fever, and swollen lymph nodes  redness, blistering, peeling, or loosening of the skin, including inside the mouth  signs and symptoms of  infection like fever or chills; cough; sore throat  signs and symptoms of kidney injury like trouble passing urine or change in the amount of urine, flank pain  tingling, numbness in the hands or feet  unusual bleeding or bruising  unusually weak or tired Side effects that usually do not require medical attention (report to your doctor or health care professional if they continue or are bothersome):  changes in taste  diarrhea  drowsiness  headache  nausea, vomiting  stomach upset This list may not describe all possible side effects. Call your doctor for medical advice about side effects. You may report side effects to FDA at 1-800-FDA-1088. Where should I keep my medicine? Keep out of the reach of children. Store at room temperature between 15 and 25 degrees C (59 and 77 degrees F). Protect from light and moisture. Throw away any unused medicine after the expiration date. NOTE: This sheet is a summary. It may not cover all possible information. If you have questions about this medicine, talk to your doctor, pharmacist, or health care provider.  2020 Elsevier/Gold Standard (2018-08-23 09:41:46)  Colchicine tablets or capsules What is this medicine? COLCHICINE (KOL chi seen) is used to prevent or treat attacks of acute gout or gouty arthritis. This medicine is also used to treat familial Mediterranean fever. This medicine may be used for other purposes; ask your health care provider or pharmacist if you have questions. COMMON BRAND NAME(S): Colcrys, MITIGARE What should I tell my health care provider before I take this medicine? They need to know if you have any of these conditions:  kidney disease  liver disease  an unusual or allergic reaction to colchicine, other medicines, foods, dyes, or preservatives  pregnant or trying to get pregnant  breast-feeding How should I use this medicine? Take this medicine by mouth with a full glass of water. Follow the directions on  the prescription label. You can take it with or without food. If it upsets your stomach, take it with food. Take your medicine at regular intervals. Do not take your medicine more often than directed. A special MedGuide will be given to you by the pharmacist with each prescription and refill. Be sure to read this information carefully each time. Talk to your pediatrician regarding the use of this medicine in children. While this drug may be prescribed for children as young as 62 years old for selected conditions, precautions do apply. Patients over 62 years old may have a stronger reaction and need a smaller dose. Overdosage: If you think you have taken too much of this medicine contact a poison control center or emergency room at once. NOTE: This medicine is only for you. Do not share this medicine with others. What if I miss a dose? If you miss a dose, take it as soon as you can. If it is almost time for your next dose, take only that dose. Do not take double or extra doses. What may interact with this medicine? Do not take this medicine with any of the following medications:  certain antivirals for HIV or hepatitis This medicine may also interact with the following medications:  certain antibiotics like erythromycin or clarithromycin  certain medicines for blood pressure, heart disease, irregular heart beat  certain medicines for cholesterol like atorvastatin, lovastatin, and simvastatin  certain medicines for fungal infections like ketoconazole, itraconazole, or posaconazole  cyclosporine  grapefruit or grapefruit juice This list may not describe all possible interactions. Give your health care provider a list of all the medicines, herbs, non-prescription drugs, or dietary supplements you use. Also tell them if you smoke, drink alcohol, or use illegal drugs. Some items may interact with your medicine. What should I watch for while using this medicine? Visit your healthcare professional  for regular checks on your progress. Tell your healthcare professional if your symptoms do not start to get better or if they get worse. You should make sure you get enough vitamin B12 while you are taking this medicine. Discuss the foods you eat and the vitamins you take with your healthcare professional. This medicine may increase your risk to bruise or bleed. Call you healthcare professional if you notice any unusual bleeding. What side effects may I notice from receiving this medicine? Side effects that you should report to your doctor or health care professional as soon as possible:  allergic reactions like skin rash, itching or hives, swelling of the face, lips, or tongue  low blood counts - this medicine may decrease the number of white blood cells, red blood cells, and platelets. You may be at increased risk for infections and bleeding  pain, tingling, numbness in the hands or feet  severe diarrhea  signs and symptoms of infection like fever; chills; cough; sore throat; pain or trouble passing urine  signs and symptoms of muscle injury like dark urine; trouble passing urine or change in the amount of urine; unusually weak or tired; muscle pain; back pain  unusual bleeding or bruising  unusually weak or tired  vomiting Side effects that usually do not require medical attention (report these to your doctor or  health care professional if they continue or are bothersome):  mild diarrhea  nausea  stomach pain This list may not describe all possible side effects. Call your doctor for medical advice about side effects. You may report side effects to FDA at 1-800-FDA-1088. Where should I keep my medicine? Keep out of the reach of children. Store at room temperature between 15 and 30 degrees C (59 and 86 degrees F). Keep container tightly closed. Protect from light. Throw away any unused medicine after the expiration date. NOTE: This sheet is a summary. It may not cover all possible  information. If you have questions about this medicine, talk to your doctor, pharmacist, or health care provider.  2020 Elsevier/Gold Standard (2017-08-18 18:45:11)

## 2019-06-15 ENCOUNTER — Other Ambulatory Visit: Payer: Self-pay | Admitting: Cardiovascular Disease

## 2019-06-24 ENCOUNTER — Other Ambulatory Visit: Payer: Self-pay | Admitting: Physician Assistant

## 2019-06-24 DIAGNOSIS — K219 Gastro-esophageal reflux disease without esophagitis: Secondary | ICD-10-CM

## 2019-07-10 ENCOUNTER — Other Ambulatory Visit: Payer: Self-pay

## 2019-07-10 DIAGNOSIS — M10072 Idiopathic gout, left ankle and foot: Secondary | ICD-10-CM

## 2019-07-10 DIAGNOSIS — R7303 Prediabetes: Secondary | ICD-10-CM

## 2019-07-10 DIAGNOSIS — I5032 Chronic diastolic (congestive) heart failure: Secondary | ICD-10-CM

## 2019-07-11 LAB — URIC ACID: Uric Acid: 9.2 mg/dL — ABNORMAL HIGH (ref 3.8–8.4)

## 2019-07-11 LAB — HEMOGLOBIN A1C
Est. average glucose Bld gHb Est-mCnc: 146 mg/dL
Hgb A1c MFr Bld: 6.7 % — ABNORMAL HIGH (ref 4.8–5.6)

## 2019-07-17 ENCOUNTER — Encounter: Payer: Self-pay | Admitting: Physician Assistant

## 2019-07-17 ENCOUNTER — Ambulatory Visit (INDEPENDENT_AMBULATORY_CARE_PROVIDER_SITE_OTHER): Admitting: Physician Assistant

## 2019-07-17 ENCOUNTER — Telehealth: Payer: Self-pay

## 2019-07-17 ENCOUNTER — Other Ambulatory Visit: Payer: Self-pay

## 2019-07-17 VITALS — BP 161/90 | HR 72 | Temp 97.6°F | Resp 16 | Wt 235.0 lb

## 2019-07-17 DIAGNOSIS — M1009 Idiopathic gout, multiple sites: Secondary | ICD-10-CM | POA: Diagnosis not present

## 2019-07-17 DIAGNOSIS — E119 Type 2 diabetes mellitus without complications: Secondary | ICD-10-CM | POA: Insufficient documentation

## 2019-07-17 DIAGNOSIS — I5032 Chronic diastolic (congestive) heart failure: Secondary | ICD-10-CM

## 2019-07-17 DIAGNOSIS — Z6836 Body mass index (BMI) 36.0-36.9, adult: Secondary | ICD-10-CM

## 2019-07-17 DIAGNOSIS — I25118 Atherosclerotic heart disease of native coronary artery with other forms of angina pectoris: Secondary | ICD-10-CM

## 2019-07-17 LAB — POCT UA - MICROALBUMIN: Microalbumin Ur, POC: 50 mg/L

## 2019-07-17 MED ORDER — ONETOUCH ULTRASOFT LANCETS MISC: 12 refills | Status: DC

## 2019-07-17 MED ORDER — ONETOUCH VERIO VI STRP: ORAL_STRIP | 12 refills | Status: DC

## 2019-07-17 MED ORDER — ONETOUCH VERIO W/DEVICE KIT: PACK | 0 refills | Status: DC

## 2019-07-17 MED ORDER — INDOMETHACIN 50 MG PO CAPS: 50.0000 mg | ORAL_CAPSULE | Freq: Three times a day (TID) | ORAL | 1 refills | Status: DC

## 2019-07-17 MED ORDER — METFORMIN HCL 500 MG PO TABS: 500.0000 mg | ORAL_TABLET | Freq: Every day | ORAL | 1 refills | Status: DC

## 2019-07-17 MED ORDER — ALLOPURINOL 100 MG PO TABS: 200.0000 mg | ORAL_TABLET | Freq: Every day | ORAL | 1 refills | Status: DC

## 2019-07-17 NOTE — Patient Instructions (Signed)
Type 2 Diabetes Mellitus, Diagnosis, Adult Type 2 diabetes (type 2 diabetes mellitus) is a long-term (chronic) disease. It may be caused by one or both of these problems:  Your pancreas does not make enough of a hormone called insulin.  Your body does not react in a normal way to insulin that it makes. Insulin lets sugars (glucose) go into cells in your body. This gives you energy. If you have type 2 diabetes, sugars cannot get into cells. This causes high blood sugar (hyperglycemia). Your doctor will set treatment goals for you. Generally, you should have these blood sugar levels:  Before meals (preprandial): 80-130 mg/dL (6.2-6.9 mmol/L).  After meals (postprandial): below 180 mg/dL (10 mmol/L).  A1c (hemoglobin A1c) level: less than 7%. Follow these instructions at home: Questions to ask your doctor  You may want to ask these questions: ? Do I need to meet with a diabetes educator? ? Where can I find a support group for people with diabetes? ? What equipment will I need to care for myself at home? ? What diabetes medicines do I need? When should I take them? ? How often do I need to check my blood sugar? ? What number can I call if I have questions? ? When is my next doctor's visit? General instructions  Take over-the-counter and prescription medicines only as told by your doctor.  Keep all follow-up visits as told by your doctor. This is important. Contact a doctor if:  Your blood sugar is at or above 240 mg/dL (48.5 mmol/L) for 2 days in a row.  You have been sick for 2 days or more, and you are not getting better.  You have had a fever for 2 days or more, and you are not getting better.  You have any of these problems for more than 6 hours: ? You cannot eat or drink. ? You feel sick to your stomach (nauseous). ? You throw up (vomit). ? You have watery poop (diarrhea). Get help right away if:  Your blood sugar is lower than 54 mg/dL (3 mmol/L).  You get  confused.  You have trouble: ? Thinking clearly. ? Breathing.  You have moderate or large ketone levels in your pee (urine). Summary  Type 2 diabetes is a long-term (chronic) disease. Your pancreas may not make enough of a hormone called insulin, or your body may not react normally to insulin that it makes.  Take over-the-counter and prescription medicines only as told by your doctor.  Keep all follow-up visits as told by your doctor. This is important. This information is not intended to replace advice given to you by your health care provider. Make sure you discuss any questions you have with your health care provider. Document Revised: 07/30/2017 Document Reviewed: 07/05/2015 Elsevier Patient Education  2020 Elsevier Inc. Carbohydrate Counting for Diabetes Mellitus, Adult  Carbohydrate counting is a method of keeping track of how many carbohydrates you eat. Eating carbohydrates naturally increases the amount of sugar (glucose) in the blood. Counting how many carbohydrates you eat helps keep your blood glucose within normal limits, which helps you manage your diabetes (diabetes mellitus). It is important to know how many carbohydrates you can safely have in each meal. This is different for every person. A diet and nutrition specialist (registered dietitian) can help you make a meal plan and calculate how many carbohydrates you should have at each meal and snack. Carbohydrates are found in the following foods:  Grains, such as breads and cereals.  Dried  beans and soy products.  Starchy vegetables, such as potatoes, peas, and corn.  Fruit and fruit juices.  Milk and yogurt.  Sweets and snack foods, such as cake, cookies, candy, chips, and soft drinks. How do I count carbohydrates? There are two ways to count carbohydrates in food. You can use either of the methods or a combination of both. Reading "Nutrition Facts" on packaged food The "Nutrition Facts" list is included on the  labels of almost all packaged foods and beverages in the [place] It includes:  The serving size.  Information about nutrients in each serving, including the grams (g) of carbohydrate per serving. To use the "Nutrition Facts":  Decide how many servings you will have.  Multiply the number of servings by the number of carbohydrates per serving.  The resulting number is the total amount of carbohydrates that you will be having. Learning standard serving sizes of other foods When you eat carbohydrate foods that are not packaged or do not include "Nutrition Facts" on the label, you need to measure the servings in order to count the amount of carbohydrates:  Measure the foods that you will eat with a food scale or measuring cup, if needed.  Decide how many standard-size servings you will eat.  Multiply the number of servings by 15. Most carbohydrate-rich foods have about 15 g of carbohydrates per serving. ? For example, if you eat 8 oz (170 g) of strawberries, you will have eaten 2 servings and 30 g of carbohydrates (2 servings x 15 g = 30 g).  For foods that have more than one food mixed, such as soups and casseroles, you must count the carbohydrates in each food that is included. The following list contains standard serving sizes of common carbohydrate-rich foods. Each of these servings has about 15 g of carbohydrates:   hamburger bun or  English muffin.   oz (15 mL) syrup.   oz (14 g) jelly.  1 slice of bread.  1 six-inch tortilla.  3 oz (85 g) cooked rice or pasta.  4 oz (113 g) cooked dried beans.  4 oz (113 g) starchy vegetable, such as peas, corn, or potatoes.  4 oz (113 g) hot cereal.  4 oz (113 g) mashed potatoes or  of a large baked potato.  4 oz (113 g) canned or frozen fruit.  4 oz (120 mL) fruit juice.  4-6 crackers.  6 chicken nuggets.  6 oz (170 g) unsweetened dry cereal.  6 oz (170 g) plain fat-free yogurt or yogurt sweetened with artificial  sweeteners.  8 oz (240 mL) milk.  8 oz (170 g) fresh fruit or one small piece of fruit.  24 oz (680 g) popped popcorn. Example of carbohydrate counting Sample meal  3 oz (85 g) chicken breast.  6 oz (170 g) brown rice.  4 oz (113 g) corn.  8 oz (240 mL) milk.  8 oz (170 g) strawberries with sugar-free whipped topping. Carbohydrate calculation 1. Identify the foods that contain carbohydrates: ? Rice. ? Corn. ? Milk. ? Strawberries. 2. Calculate how many servings you have of each food: ? 2 servings rice. ? 1 serving corn. ? 1 serving milk. ? 1 serving strawberries. 3. Multiply each number of servings by 15 g: ? 2 servings rice x 15 g = 30 g. ? 1 serving corn x 15 g = 15 g. ? 1 serving milk x 15 g = 15 g. ? 1 serving strawberries x 15 g = 15 g. 4. Add  together all of the amounts to find the total grams of carbohydrates eaten: ? 30 g + 15 g + 15 g + 15 g = 75 g of carbohydrates total. Summary  Carbohydrate counting is a method of keeping track of how many carbohydrates you eat.  Eating carbohydrates naturally increases the amount of sugar (glucose) in the blood.  Counting how many carbohydrates you eat helps keep your blood glucose within normal limits, which helps you manage your diabetes.  A diet and nutrition specialist (registered dietitian) can help you make a meal plan and calculate how many carbohydrates you should have at each meal and snack. This information is not intended to replace advice given to you by your health care provider. Make sure you discuss any questions you have with your health care provider. Document Revised: 12/24/2016 Document Reviewed: 11/13/2015 Elsevier Patient Education  2020 [employer]. Diabetes Mellitus and Nutrition, Adult When you have diabetes (diabetes mellitus), it is very important to have healthy eating habits because your blood sugar (glucose) levels are greatly affected by what you eat and drink. Eating healthy foods in the  appropriate amounts, at about the same times every day, can help you:  Control your blood glucose.  Lower your risk of heart disease.  Improve your blood pressure.  Reach or maintain a healthy weight. Every person with diabetes is different, and each person has different needs for a meal plan. Your health care provider may recommend that you work with a diet and nutrition specialist (dietitian) to make a meal plan that is best for you. Your meal plan may vary depending on factors such as:  The calories you need.  The medicines you take.  Your weight.  Your blood glucose, blood pressure, and cholesterol levels.  Your activity level.  Other health conditions you have, such as heart or kidney disease. How do carbohydrates affect me? Carbohydrates, also called carbs, affect your blood glucose level more than any other type of food. Eating carbs naturally raises the amount of glucose in your blood. Carb counting is a method for keeping track of how many carbs you eat. Counting carbs is important to keep your blood glucose at a healthy level, especially if you use insulin or take certain oral diabetes medicines. It is important to know how many carbs you can safely have in each meal. This is different for every person. Your dietitian can help you calculate how many carbs you should have at each meal and for each snack. Foods that contain carbs include:  Bread, cereal, rice, pasta, and crackers.  Potatoes and corn.  Peas, beans, and lentils.  Milk and yogurt.  Fruit and juice.  Desserts, such as cakes, cookies, ice cream, and candy. How does alcohol affect me? Alcohol can cause a sudden decrease in blood glucose (hypoglycemia), especially if you use insulin or take certain oral diabetes medicines. Hypoglycemia can be a life-threatening condition. Symptoms of hypoglycemia (sleepiness, dizziness, and confusion) are similar to symptoms of having too much alcohol. If your health care  provider says that alcohol is safe for you, follow these guidelines:  Limit alcohol intake to no more than 1 drink per day for nonpregnant women and 2 drinks per day for men. One drink equals 12 oz of beer, 5 oz of wine, or 1 oz of hard liquor.  Do not drink on an empty stomach.  Keep yourself hydrated with water, diet soda, or unsweetened iced tea.  Keep in mind that regular soda, juice, and other  mixers may contain a lot of sugar and must be counted as carbs. What are tips for following this plan?  Reading food labels  Start by checking the serving size on the "Nutrition Facts" label of packaged foods and drinks. The amount of calories, carbs, fats, and other nutrients listed on the label is based on one serving of the item. Many items contain more than one serving per package.  Check the total grams (g) of carbs in one serving. You can calculate the number of servings of carbs in one serving by dividing the total carbs by 15. For example, if a food has 30 g of total carbs, it would be equal to 2 servings of carbs.  Check the number of grams (g) of saturated and trans fats in one serving. Choose foods that have low or no amount of these fats.  Check the number of milligrams (mg) of salt (sodium) in one serving. Most people should limit total sodium intake to less than 2,300 mg per day.  Always check the nutrition information of foods labeled as "low-fat" or "nonfat". These foods may be higher in added sugar or refined carbs and should be avoided.  Talk to your dietitian to identify your daily goals for nutrients listed on the label. Shopping  Avoid buying canned, premade, or processed foods. These foods tend to be high in fat, sodium, and added sugar.  Shop around the outside edge of the grocery store. This includes fresh fruits and vegetables, bulk grains, fresh meats, and fresh dairy. Cooking  Use low-heat cooking methods, such as baking, instead of high-heat cooking methods like  deep frying.  Cook using healthy oils, such as olive, canola, or sunflower oil.  Avoid cooking with butter, cream, or high-fat meats. Meal planning  Eat meals and snacks regularly, preferably at the same times every day. Avoid going long periods of time without eating.  Eat foods high in fiber, such as fresh fruits, vegetables, beans, and whole grains. Talk to your dietitian about how many servings of carbs you can eat at each meal.  Eat 4-6 ounces (oz) of lean protein each day, such as lean meat, chicken, fish, eggs, or tofu. One oz of lean protein is equal to: ? 1 oz of meat, chicken, or fish. ? 1 egg. ?  cup of tofu.  Eat some foods each day that contain healthy fats, such as avocado, nuts, seeds, and fish. Lifestyle  Check your blood glucose regularly.  Exercise regularly as told by your health care provider. This may include: ? 150 minutes of moderate-intensity or vigorous-intensity exercise each week. This could be brisk walking, biking, or water aerobics. ? Stretching and doing strength exercises, such as yoga or weightlifting, at least 2 times a week.  Take medicines as told by your health care provider.  Do not use any products that contain nicotine or tobacco, such as cigarettes and e-cigarettes. If you need help quitting, ask your health care provider.  Work with a Veterinary surgeon or diabetes educator to identify strategies to manage stress and any emotional and social challenges. Questions to ask a health care provider  Do I need to meet with a diabetes educator?  Do I need to meet with a dietitian?  What number can I call if I have questions?  When are the best times to check my blood glucose? Where to find more information:  American Diabetes Association: diabetes.org  Academy of Nutrition and Dietetics: www.eatright.[employer] of Diabetes and Digestive and Kidney  Diseases (NIH): CarFli[employer].tnwww.niddk.nih.gov Summary  A healthy meal plan will help you control  your blood glucose and maintain a healthy lifestyle.  Working with a diet and nutrition specialist (dietitian) can help you make a meal plan that is best for you.  Keep in mind that carbohydrates (carbs) and alcohol have immediate effects on your blood glucose levels. It is important to count carbs and to use alcohol carefully. This information is not intended to replace advice given to you by your health care provider. Make sure you discuss any questions you have with your health care provider. Document Revised: 05/14/2017 Document Reviewed: 07/06/2016 Elsevier Patient Education  2020 [employer]

## 2019-07-17 NOTE — Progress Notes (Signed)
Patient: Jonathon Newman Male    DOB: 1955-06-28   64 y.o.   MRN: [id number] Visit Date: 07/18/2019  Today's Provider: Mar Daring, PA-C   Chief Complaint  Patient presents with  . Diabetes   Subjective:     HPI  Patient here to discuss lab results.   Last week labs were obtained and was found to have an elevated Uric Acid still despite allopurinol 160m. Also was found to be newly diabetic with an A1c of 6.7. This had been an increase from 6.2 previously.   Currently he denies any worsening neuropathy, has some already from chronic back pain. Denies urinary changes.   He does mention having more constant chest pressure/pain. He is seeing his cardiologist, Dr. GRockey Situ tomorrow.    Lab Results  Component Value Date   HGBA1C 6.7 (H) 07/10/2019      Allergies  Allergen Reactions  . Colchicine Other (See Comments)    Palpitations and headache  . Lisinopril Cough     Current Outpatient Medications:  .  amLODipine (NORVASC) 5 MG tablet, Take 1 tablet (5 mg total) by mouth 2 (two) times daily., Disp: 180 tablet, Rfl: 3 .  aspirin 81 MG tablet, Take 81 mg by mouth daily., Disp: , Rfl:  .  atorvastatin (LIPITOR) 40 MG tablet, TAKE ONE TABLET BY MOUTH DAILY, Disp: 90 tablet, Rfl: 3 .  carvedilol (COREG) 25 MG tablet, TAKE ONE TABLET BY MOUTH TWICE A DAY WITH MEALS, Disp: 180 tablet, Rfl: 0 .  isosorbide mononitrate (IMDUR) 60 MG 24 hr tablet, Take 1 tablet (60 mg total) by mouth daily., Disp: 90 tablet, Rfl: 0 .  losartan (COZAAR) 100 MG tablet, TAKE ONE TABLET BY MOUTH DAILY, Disp: 90 tablet, Rfl: 1 .  metolazone (ZAROXOLYN) 5 MG tablet, Take 1 tablet (5 mg total) by mouth once a week., Disp: 90 tablet, Rfl: 3 .  Multiple Vitamin (MULTIVITAMIN WITH MINERALS) TABS tablet, Take 1 tablet by mouth daily., Disp: , Rfl:  .  naproxen sodium (ALEVE) 220 MG tablet, Take 440 mg by mouth 2 (two) times daily as needed (mild pain)., Disp: , Rfl:  .  pantoprazole (PROTONIX) 20  MG tablet, TAKE TWO TABLETS BY MOUTH TWICE A DAY, Disp: 360 tablet, Rfl: 0 .  potassium chloride (KLOR-CON) 10 MEQ tablet, TAKE ONE TABLET BY MOUTH TWICE A DAY AS NEEDED, Disp: 180 tablet, Rfl: 0 .  sildenafil (VIAGRA) 100 MG tablet, Take 100 mg by mouth daily as needed for erectile dysfunction., Disp: , Rfl:  .  torsemide (DEMADEX) 20 MG tablet, Take 2 tablets (40 mg total) by mouth 2 (two) times daily., Disp: 120 tablet, Rfl: 3 .  allopurinol (ZYLOPRIM) 100 MG tablet, Take 2 tablets (200 mg total) by mouth daily., Disp: 180 tablet, Rfl: 1 .  Blood Glucose Monitoring Suppl (ONETOUCH VERIO) w/Device KIT, To check blood sugar once daily, Disp: 1 kit, Rfl: 0 .  glucose blood (ONETOUCH VERIO) test strip, To check blood sugar once daily, Disp: 100 each, Rfl: 12 .  indomethacin (INDOCIN) 50 MG capsule, Take 1 capsule (50 mg total) by mouth 3 (three) times daily with meals., Disp: 90 capsule, Rfl: 1 .  Lancets (ONETOUCH ULTRASOFT) lancets, To check blood sugar once daily, Disp: 100 each, Rfl: 12 .  metFORMIN (GLUCOPHAGE) 500 MG tablet, Take 1 tablet (500 mg total) by mouth daily with breakfast., Disp: 90 tablet, Rfl: 1  Review of Systems  Constitutional: Negative.   Respiratory: Negative.  Cardiovascular: Positive for chest pain.  Gastrointestinal: Negative.   Genitourinary: Negative.   Musculoskeletal: Positive for back pain.  Neurological: Negative.     Social History   Tobacco Use  . Smoking status: Former Smoker    Packs/day: 0.25    Years: 2.00    Pack years: 0.50    Types: Cigarettes    Quit date: 05/26/2005    Years since quitting: 14.1  . Smokeless tobacco: Former Systems developer    Types: Chew  Substance Use Topics  . Alcohol use: No      Objective:   BP (!) 161/90 (BP Location: Left Arm, Patient Position: Sitting, Cuff Size: Large)   Pulse 72   Temp 97.6 F (36.4 C) (Temporal)   Resp 16   Wt 235 lb (106.6 kg)   BMI 36.81 kg/m  Vitals:   07/17/19 1805  BP: (!) 161/90    Pulse: 72  Resp: 16  Temp: 97.6 F (36.4 C)  TempSrc: Temporal  Weight: 235 lb (106.6 kg)  Body mass index is 36.81 kg/m.   Physical Exam Vitals reviewed.  Constitutional:      General: He is not in acute distress.    Appearance: Normal appearance. He is well-developed. He is obese. He is not ill-appearing or diaphoretic.  HENT:     Head: Normocephalic and atraumatic.  Cardiovascular:     Rate and Rhythm: Normal rate and regular rhythm.     Pulses: Normal pulses.     Heart sounds: Normal heart sounds. No murmur. No friction rub. No gallop.   Pulmonary:     Effort: Pulmonary effort is normal. No respiratory distress.     Breath sounds: Normal breath sounds. No wheezing or rales.  Musculoskeletal:     Cervical back: Normal range of motion and neck supple.     Right lower leg: No edema.     Left lower leg: No edema.  Skin:    Capillary Refill: Capillary refill takes less than 2 seconds.  Neurological:     General: No focal deficit present.     Mental Status: He is alert and oriented to person, place, and time. Mental status is at baseline.  Psychiatric:        Mood and Affect: Mood normal.        Thought Content: Thought content normal.      Results for orders placed or performed in visit on 07/17/19  POCT UA - Microalbumin  Result Value Ref Range   Microalbumin Ur, POC 50 mg/L       Assessment & Plan    1. New onset type 2 diabetes mellitus ([place]) New onset. Discussed diabetic education class, which he declines at this time. Discussed carb counting and trying to limit to between 45-75 grams of carbs daily. Information printed on AVS.  Agreeable to start Metformin 540m daily as below. Continue Losartan 1064mand Atorvastatin 4049mor cardioprotection. Test kit and strips sent in for patient to check blood sugar daily. Microalbumin today is 50, but he is on losartan already. Will return in 3 months.  - POCT UA - Microalbumin - metFORMIN (GLUCOPHAGE) 500 MG tablet;  Take 1 tablet (500 mg total) by mouth daily with breakfast.  Dispense: 90 tablet; Refill: 1 - Lancets (ONETOUCH ULTRASOFT) lancets; To check blood sugar once daily  Dispense: 100 each; Refill: 12 - glucose blood (ONETOUCH VERIO) test strip; To check blood sugar once daily  Dispense: 100 each; Refill: 12 - Blood Glucose Monitoring Suppl (ONETOUCH VERIO) w/Device  KIT; To check blood sugar once daily  Dispense: 1 kit; Refill: 0  2. Acute idiopathic gout of multiple sites Increase allopurinol to 263m daily. Will change colchicine to indomethacin as below for acute flares. Discussed low purine diet.  - allopurinol (ZYLOPRIM) 100 MG tablet; Take 2 tablets (200 mg total) by mouth daily.  Dispense: 180 tablet; Refill: 1 - indomethacin (INDOCIN) 50 MG capsule; Take 1 capsule (50 mg total) by mouth 3 (three) times daily with meals.  Dispense: 90 capsule; Refill: 1  3. Class 2 severe obesity due to excess calories with serious comorbidity and body mass index (BMI) of 36.0 to 36.9 in adult ([facility] Counseled patient on healthy lifestyle modifications including dieting and exercise.   4. Chronic heart failure with preserved ejection fraction (HCC) Currently appears Euvolemic. Continue Torsemide 472mBID.   5. Coronary artery disease of native artery of native heart with stable angina pectoris ([place]. Has appt with Cardiology tomorrow for further evaluation already scheduled.      JeMar DaringPA-C  [facility]

## 2019-07-17 NOTE — Telephone Encounter (Signed)
[id number] for PEC to give results. 

## 2019-07-17 NOTE — Telephone Encounter (Signed)
-----   Message from Margaretann Loveless, [place] sent at 07/17/2019  9:52 AM EST ----- Uric acid is still elevated despite the allopurinol 100mg . I would recommend to increase the allopurinol to 200mg . Also A1c is now indicating a diabetic range. We can schedule him in office or virtual to discuss.

## 2019-07-17 NOTE — Telephone Encounter (Signed)
Pt given lab results per notes of Joycelyn Man PA-C on 07/17/19. Pt verbalized understanding. In office appt made for today to discuss results.

## 2019-07-18 ENCOUNTER — Encounter: Payer: Self-pay | Admitting: Cardiovascular Disease

## 2019-07-18 ENCOUNTER — Encounter: Payer: Self-pay | Admitting: Physician Assistant

## 2019-07-18 ENCOUNTER — Ambulatory Visit (INDEPENDENT_AMBULATORY_CARE_PROVIDER_SITE_OTHER): Admitting: Cardiovascular Disease

## 2019-07-18 VITALS — BP 115/72 | HR 75 | Ht 67.0 in | Wt 237.5 lb

## 2019-07-18 DIAGNOSIS — I25118 Atherosclerotic heart disease of native coronary artery with other forms of angina pectoris: Secondary | ICD-10-CM | POA: Diagnosis not present

## 2019-07-18 DIAGNOSIS — Z6836 Body mass index (BMI) 36.0-36.9, adult: Secondary | ICD-10-CM

## 2019-07-18 DIAGNOSIS — I5032 Chronic diastolic (congestive) heart failure: Secondary | ICD-10-CM | POA: Diagnosis not present

## 2019-07-18 DIAGNOSIS — E785 Hyperlipidemia, unspecified: Secondary | ICD-10-CM | POA: Diagnosis not present

## 2019-07-18 DIAGNOSIS — I10 Essential (primary) hypertension: Secondary | ICD-10-CM | POA: Diagnosis not present

## 2019-07-18 DIAGNOSIS — R079 Chest pain, unspecified: Secondary | ICD-10-CM

## 2019-07-18 NOTE — Patient Instructions (Addendum)
We will schedule a lexiscan myoview this week For anginal pain The morning of the test, hold amlodipine, isosorbide, carvedilol See instructions below  Medication Instructions:  No changes  If you need a refill on your cardiac medications before your next appointment, please call your pharmacy.    Lab work: No new labs needed   If you have labs (blood work) drawn today and your tests are completely normal, you will receive your results only by: Marland Kitchen MyChart Message (if you have MyChart) OR . A paper copy in the mail If you have any lab test that is abnormal or we need to change your treatment, we will call you to review the results.   Testing/Procedures: [facility] MYOVIEW  Your caregiver has ordered a Stress Test with nuclear imaging. The purpose of this test is to evaluate the blood supply to your heart muscle. This procedure is referred to as a "Non-Invasive Stress Test." This is because other than having an IV started in your vein, nothing is inserted or "invades" your body. Cardiac stress tests are done to find areas of poor blood flow to the heart by determining the extent of coronary artery disease (CAD). Some patients exercise on a treadmill, which naturally increases the blood flow to your heart, while others who are  unable to walk on a treadmill due to physical limitations have a pharmacologic/chemical stress agent called Lexiscan . This medicine will mimic walking on a treadmill by temporarily increasing your coronary blood flow.   Please note: these test may take anywhere between 2-4 hours to complete  PLEASE REPORT TO [facility] MEDICAL MALL ENTRANCE  THE VOLUNTEERS AT THE FIRST DESK WILL DIRECT YOU WHERE TO GO  Date of Procedure:_Friday February 15th_  Arrival Time for Procedure:_08:15 AM___  Instructions regarding medication:   _XX___ : Hold diabetes medication morning of procedure  _XX___:  Hold Carvedilol the night before procedure and morning of procedure  _XX___:  Hold  your fluid pill the day of your test as well as Isosorbide mononitrate, and amlodipine.   PLEASE NOTIFY THE OFFICE AT LEAST 24 HOURS IN ADVANCE IF YOU ARE UNABLE TO KEEP YOUR APPOINTMENT.  [phone] AND  PLEASE NOTIFY NUCLEAR MEDICINE AT [facility] AT LEAST 24 HOURS IN ADVANCE IF YOU ARE UNABLE TO KEEP YOUR APPOINTMENT. [phone]  How to prepare for your Myoview test:  1. Do not eat or drink after midnight 2. No caffeine for 24 hours prior to test 3. No smoking 24 hours prior to test. 4. Your medication may be taken with water.  If your doctor stopped a medication because of this test, do not take that medication. 5. Ladies, please do not wear dresses.  Skirts or pants are appropriate. Please wear a short sleeve shirt. 6. No perfume, cologne or lotion. 7. Wear comfortable walking shoes. No heels!      Follow-Up: At [facility], you and your health needs are our priority.  As part of our continuing mission to provide you with exceptional heart care, we have created designated Provider Care Teams.  These Care Teams include your primary Cardiologist (physician) and Advanced Practice Providers (APPs -  Physician Assistants and Nurse Practitioners) who all work together to provide you with the care you need, when you need it.  . You will need a follow up appointment in 6 months   . Providers on your designated Care Team:   . Nicolasa Ducking, NP . Eula Listen, PA-C . Marisue Ivan, PA-C  Any Other Special Instructions Will  Be Listed Below (If Applicable).  For educational health videos Log in to : [email or web address].com Or : [email or web address], password : [email or web address]

## 2019-07-18 NOTE — Progress Notes (Signed)
Cardiology Office Note  Date:  07/18/2019   ID:  Jonathon Newman, DOB Oct 20, 1955, MRN [id number]  PCP:  Jonathon Daring, PA-C   Chief Complaint  Patient presents with  . office visit    Pt 3 month f/u. Left side of face feels numb at times. Dizziness. swelling in ankles and feet. chest discomfort. Meds verbally reviewed w/ pt.    HPI:  [name] is a 65 year old gentleman with past medical history of coronary artery disease  chronic diastolic heart failure.   Cypher drug-eluting stent placement to the RCA in 2010.    two-vessel coronary artery bypass graft in 2016 at [facility].   Hyperlipidemia Who presents for follow-up of his chronic diastolic CHF,   Dating back to office visit October 2019 had chronic dizziness Continues to have dizziness today  Shortness of breath with exertion again dating back several years Deconditioned, has to stop and rest  Reports having pain left side of his face Feels like nerve pain, tingling That symptoms the past week  Also reports having pain left shoulder posterior aspect, Up into his left jaw at times Concerned it could be cardiac in nature   Blood pressure running high at home even dating back to several years ago Blood pressure on today's visit well controlled, 163 systolic left arm on initial check and on my repeat  Previous cardiac catheterization results reviewed with him from November 2019 He did not remember having cardiac catheterization then, we went through the office visits prior to and discussed the cardiac catheterization results where medical management was recommended He was having symptoms of chest discomfort at the time  diagnostic R/LHC on 05/02/2018 that showed significant underlying left main disease with patent grafts including LIMA to LAD and SVG to OM2. The RCA stent was patent with minimal restenosis. He had normal LVSF with a mildly elevated LVEDP at 14-15 mmHg.   02/2019 to the ER for chest pressure 4  days Ruled out, added hydralazine  He is wearing his C-PAP.  EKG personally reviewed by myself on todays visit Shows normal sinus rhythm rate 75 bpm old inferior MI no significant ST or T wave changes  PMH:   has a past medical history of Arthritis, CHF (congestive heart failure) ([place]), Coronary artery disease, Diabetes mellitus without complication ([place]), Dyspnea, GERD (gastroesophageal reflux disease), Hypercholesteremia, Hypertension, S/P coronary artery bypass graft x 2, and Sleep apnea.  PSH:    Past Surgical History:  Procedure Laterality Date  . APPENDECTOMY    . BACK SURGERY  11/01/2017   SL 5 and S1  . CARDIAC CATHETERIZATION    . CARPAL TUNNEL RELEASE     left hand  . CHOLECYSTECTOMY    . CHOLECYSTECTOMY    . CORONARY ARTERY BYPASS GRAFT  04/21/2015   CABG x 2 [place] [facility]. LIMA to LAD amd SVG to OM2  . CORONARY STENT PLACEMENT  2010   Cordis Cypher Sirolimus-eluting stent 2.50 mm x 26 mm placed to the RCA at [place] N/A 08/04/2017   Procedure: ESOPHAGEAL MANOMETRY (EM);  Surgeon: Lin Landsman, MD;  Location: [facility] ENDOSCOPY;  Service: Endoscopy;  Laterality: N/A;  . FRACTURE SURGERY    . KNEE SURGERY    . KNEE SURGERY     right knee   . LAMINECTOMY     "plate in neck W4-Y6"  . LEFT HEART CATH AND CORS/GRAFTS ANGIOGRAPHY N/A 05/02/2018   Procedure: CORS/GRAFTS ANGIOGRAPHY;  Surgeon: Fletcher Anon,  Mertie Clause, MD;  Location: [place] CV LAB;  Service: Cardiovascular;  Laterality: N/A;  . RIGHT/LEFT HEART CATH AND CORONARY ANGIOGRAPHY Bilateral 05/02/2018   Procedure: LEFT HEART CATH;  Surgeon: Wellington Hampshire, MD;  Location: [place] CV LAB;  Service: Cardiovascular;  Laterality: Bilateral;  . VASECTOMY      Current Outpatient Medications  Medication Sig Dispense Refill  . allopurinol (ZYLOPRIM) 100 MG tablet Take 2 tablets (200 mg total) by mouth daily. 180 tablet 1  . amLODipine (NORVASC) 5 MG tablet Take 1  tablet (5 mg total) by mouth 2 (two) times daily. 180 tablet 3  . aspirin 81 MG tablet Take 81 mg by mouth daily.    Marland Kitchen atorvastatin (LIPITOR) 40 MG tablet TAKE ONE TABLET BY MOUTH DAILY 90 tablet 3  . Blood Glucose Monitoring Suppl (ONETOUCH VERIO) w/Device KIT To check blood sugar once daily 1 kit 0  . carvedilol (COREG) 25 MG tablet TAKE ONE TABLET BY MOUTH TWICE A DAY WITH MEALS 180 tablet 0  . glucose blood (ONETOUCH VERIO) test strip To check blood sugar once daily 100 each 12  . indomethacin (INDOCIN) 50 MG capsule Take 1 capsule (50 mg total) by mouth 3 (three) times daily with meals. 90 capsule 1  . isosorbide mononitrate (IMDUR) 60 MG 24 hr tablet Take 1 tablet (60 mg total) by mouth daily. 90 tablet 0  . Lancets (ONETOUCH ULTRASOFT) lancets To check blood sugar once daily 100 each 12  . losartan (COZAAR) 100 MG tablet TAKE ONE TABLET BY MOUTH DAILY 90 tablet 1  . metFORMIN (GLUCOPHAGE) 500 MG tablet Take 1 tablet (500 mg total) by mouth daily with breakfast. 90 tablet 1  . Multiple Vitamin (MULTIVITAMIN WITH MINERALS) TABS tablet Take 1 tablet by mouth daily.    . naproxen sodium (ALEVE) 220 MG tablet Take 440 mg by mouth 2 (two) times daily as needed (mild pain).    . pantoprazole (PROTONIX) 20 MG tablet TAKE TWO TABLETS BY MOUTH TWICE A DAY 360 tablet 0  . potassium chloride (KLOR-CON) 10 MEQ tablet TAKE ONE TABLET BY MOUTH TWICE A DAY AS NEEDED 180 tablet 0  . sildenafil (VIAGRA) 100 MG tablet Take 100 mg by mouth daily as needed for erectile dysfunction.    . torsemide (DEMADEX) 20 MG tablet Take 2 tablets (40 mg total) by mouth 2 (two) times daily. 120 tablet 3  . metolazone (ZAROXOLYN) 5 MG tablet Take 1 tablet (5 mg total) by mouth once a week. 90 tablet 3   No current facility-administered medications for this visit.     Allergies:   Colchicine and Lisinopril   Social History:  The patient  reports that he quit smoking about 14 years ago. His smoking use included  cigarettes. He has a 0.50 pack-year smoking history. He has quit using smokeless tobacco.  His smokeless tobacco use included chew. He reports that he does not drink alcohol or use drugs.   Family History:   family history includes Arthritis in his mother; Cancer in his maternal grandfather; Cataracts in his maternal grandmother; Glaucoma in his maternal grandmother; Heart attack in his paternal grandfather; Heart attack (age of onset: 21) in his father; Heart murmur in his brother; Hyperlipidemia in his father; Hypertension in his father and paternal grandfather; Valvular heart disease in his brother.    Review of Systems: Review of Systems  Constitutional: Negative.        Weight gain  Respiratory: Negative.   Cardiovascular: Positive for  palpitations.  Gastrointestinal: Positive for nausea.  Musculoskeletal: Negative.   Neurological: Positive for dizziness.  Psychiatric/Behavioral: Negative.   All other systems reviewed and are negative.    PHYSICAL EXAM: VS:  BP 120/72 (BP Location: Left Arm, Patient Position: Sitting, Cuff Size: Normal)   Pulse 75   Ht _0  (1.702 m)   Wt 237 lb 8 oz (107.7 kg)   SpO2 96%   BMI 37.20 kg/m  , BMI Body mass index is 37.2 kg/m. Constitutional:  oriented to person, place, and time. No distress.  HENT:  Head: Grossly normal Eyes:  no discharge. No scleral icterus.  Neck: No JVD, no carotid bruits  Cardiovascular: Regular rate and rhythm, no murmurs appreciated Pulmonary/Chest: Clear to auscultation bilaterally, no wheezes or rails Abdominal: Soft.  no distension.  no tenderness.  Musculoskeletal: Normal range of motion Neurological:  normal muscle tone. Coordination normal. No atrophy Skin: Skin warm and dry Psychiatric: normal affect, pleasant   Recent Labs: 03/10/2019: ALT 68 03/11/2019: Hemoglobin 13.0; Platelets 164 03/20/2019: BUN 22; Creatinine, Ser 1.16; Potassium 3.9; Sodium 141    Lipid Panel Lab Results  Component Value Date    CHOL 185 02/02/2018   HDL 35 (L) 02/02/2018   LDLCALC Comment 02/02/2018   TRIG 692 (HH) 02/02/2018      Wt Readings from Last 3 Encounters:  07/18/19 237 lb 8 oz (107.7 kg)  07/17/19 235 lb (106.6 kg)  06/13/19 236 lb (107 kg)      ASSESSMENT AND PLAN:  Acute on chronic diastolic congestive heart failure ([place]) -  Appears euvolemic on today's visit, no changes made to his medications  Coronary artery disease involving native coronary artery of native heart with angina pectoris (HCC) Chronic angina, prior cardiac catheterization results discussed with him in detail He did not remember having the catheterization but we discussed the office visits and catheterization in detail -Continues to have pain in the left shoulder which is atypical, some jaw pain atypical Recommended pharmacologic Myoview to rule out high risk ischemia  S/P coronary artery bypass graft x 2 - Plan: EKG 12-Lead Stress test as above  Hyperlipidemia, unspecified hyperlipidemia type Poor diet, weight trending higher Continue Lipitor 40 daily We have encouraged continued exercise, careful diet management in an effort to lose weight.  Essential hypertension Blood pressure well controlled on today's visit We did discuss his numbers from home which seem high given his well-controlled numbers in the office today Recommend he bring in blood pressure cuff to his next doctor visit to correlate manual cuff with his automated cuff  Chronic dizziness Reports having extensive ENT work-up in the past and by his report was negative Reports he continues to have symptoms  Disposition:   F/U with 12 months  Discussed anginal symptoms, prior cardiac catheterization, blood pressure management, management of stable angina  Total encounter time more than 45 minutes  Greater than 50% was spent in counseling and coordination of care with the patient    Orders Placed This Encounter  Procedures  . EKG 12-Lead      Signed, Esmond Plants, M.D., Ph.D. 07/18/2019  [place], [place]

## 2019-07-20 ENCOUNTER — Other Ambulatory Visit: Payer: Self-pay | Admitting: Physician Assistant

## 2019-07-20 MED ORDER — FREESTYLE LITE DEVI: 0 refills | Status: AC

## 2019-07-20 MED ORDER — FREESTYLE LANCETS MISC: 12 refills | Status: DC

## 2019-07-20 MED ORDER — FREESTYLE LITE TEST VI STRP: ORAL_STRIP | 12 refills | Status: DC

## 2019-07-20 NOTE — Progress Notes (Signed)
Updated meter

## 2019-07-21 ENCOUNTER — Ambulatory Visit
Admission: RE | Admit: 2019-07-21 | Discharge: 2019-07-21 | Disposition: A | Discharge status: Home / Self Care | Source: Ambulatory Visit | Attending: Cardiovascular Disease | Admitting: Cardiovascular Disease

## 2019-07-21 ENCOUNTER — Other Ambulatory Visit: Payer: Self-pay

## 2019-07-21 DIAGNOSIS — R079 Chest pain, unspecified: Secondary | ICD-10-CM | POA: Diagnosis present

## 2019-07-21 LAB — NM MYOCAR MULTI W/SPECT W/WALL MOTION / EF
LV dias vol: 109 mL (ref 62–150)
LV sys vol: 36 mL
Peak HR: 83 {beats}/min
Percent HR: 52 %
Rest HR: 62 {beats}/min
SDS: 0
SRS: 1
SSS: 0
TID: 1.05

## 2019-07-21 MED ORDER — TECHNETIUM TC 99M TETROFOSMIN IV KIT
10.9700 | PACK | Freq: Once | INTRAVENOUS | Status: AC | PRN
  Administered 2019-07-21: 10.97 via INTRAVENOUS

## 2019-07-21 MED ORDER — TECHNETIUM TC 99M TETROFOSMIN IV KIT
29.7800 | PACK | Freq: Once | INTRAVENOUS | Status: AC | PRN
  Administered 2019-07-21: 10:00:00 29.78 via INTRAVENOUS

## 2019-07-21 MED ORDER — REGADENOSON 0.4 MG/5ML IV SOLN
0.4000 mg | Freq: Once | INTRAVENOUS | Status: AC
  Administered 2019-07-21: 10:00:00 0.4 mg via INTRAVENOUS

## 2019-07-27 ENCOUNTER — Telehealth: Payer: Self-pay | Admitting: Physician Assistant

## 2019-07-27 DIAGNOSIS — M1009 Idiopathic gout, multiple sites: Secondary | ICD-10-CM

## 2019-07-27 MED ORDER — INDOMETHACIN 50 MG PO CAPS: 50.0000 mg | ORAL_CAPSULE | Freq: Three times a day (TID) | ORAL | 1 refills | Status: DC

## 2019-07-27 MED ORDER — ALLOPURINOL 100 MG PO TABS: 200.0000 mg | ORAL_TABLET | Freq: Every day | ORAL | 1 refills | Status: DC

## 2019-07-27 NOTE — Telephone Encounter (Signed)
Left patient a message advising him that medications have have been resent to pharmacy.

## 2019-07-27 NOTE — Telephone Encounter (Signed)
Patient came in the office and states that the pharmacy says that they do not have the prescription for allopurinol (ZYLOPRIM) 100 MG tablet 2 by mouth daily.  He also states that you were supposed to sent in something for gout when he was here last time because of his blood pressure and they say they do not have it either.  He wants it sent to [facility].

## 2019-07-27 NOTE — Telephone Encounter (Signed)
Resent medications today

## 2019-08-07 ENCOUNTER — Other Ambulatory Visit: Payer: Self-pay | Admitting: Physician Assistant

## 2019-08-07 DIAGNOSIS — I10 Essential (primary) hypertension: Secondary | ICD-10-CM

## 2019-08-07 DIAGNOSIS — I25118 Atherosclerotic heart disease of native coronary artery with other forms of angina pectoris: Secondary | ICD-10-CM

## 2019-08-07 NOTE — Telephone Encounter (Signed)
Requested Prescriptions  Pending Prescriptions Disp Refills  . carvedilol (COREG) 25 MG tablet [Pharmacy Med Name: CARVEDILOL 25 MG TABLET] 180 tablet 3    Sig: TAKE ONE TABLET BY MOUTH TWICE A DAY WITH MEALS     Cardiovascular:  Beta Blockers Passed - 08/07/2019 10:29 AM      Passed - Last BP in normal range    BP Readings from Last 1 Encounters:  07/18/19 115/72         Passed - Last Heart Rate in normal range    Pulse Readings from Last 1 Encounters:  07/18/19 75         Passed - Valid encounter within last 6 months    Recent Outpatient Visits          3 weeks ago New onset type 2 diabetes mellitus [facility])   [facility] [place], [place], [place]   1 month ago Acute idiopathic gout of left ankle   [facility] [place], [place], [place]   4 months ago Unstable angina [facility])   [facility] [place], [place], [place]   7 months ago Elevated glucose   [facility] [place], [place], [place]   1 year ago Neuroforaminal stenosis of lumbosacral spine   [facility] [place], [place], [place]      Future Appointments            In 5 months Gollan, Tollie Pizza, MD [facility], [facility]

## 2019-08-13 ENCOUNTER — Other Ambulatory Visit: Payer: Self-pay | Admitting: Physician Assistant

## 2019-08-13 DIAGNOSIS — I10 Essential (primary) hypertension: Secondary | ICD-10-CM

## 2019-08-13 DIAGNOSIS — I25118 Atherosclerotic heart disease of native coronary artery with other forms of angina pectoris: Secondary | ICD-10-CM

## 2019-08-31 ENCOUNTER — Other Ambulatory Visit: Payer: Self-pay | Admitting: Cardiovascular Disease

## 2019-09-15 ENCOUNTER — Other Ambulatory Visit: Payer: Self-pay | Admitting: Physician Assistant

## 2019-09-15 DIAGNOSIS — K219 Gastro-esophageal reflux disease without esophagitis: Secondary | ICD-10-CM

## 2019-09-15 NOTE — Telephone Encounter (Signed)
Requested Prescriptions  Pending Prescriptions Disp Refills  . pantoprazole (PROTONIX) 20 MG tablet [Pharmacy Med Name: PANTOPRAZOLE SOD DR 20 MG TAB] 360 tablet 0    Sig: TAKE TWO TABLETS BY MOUTH TWICE A DAY     Gastroenterology: Proton Pump Inhibitors Passed - 09/15/2019 10:30 AM      Passed - Valid encounter within last 12 months    Recent Outpatient Visits          2 months ago New onset type 2 diabetes mellitus [facility])   [facility] [place], [place], [place]   3 months ago Acute idiopathic gout of left ankle   [facility] [place], [place], [place]   6 months ago Unstable angina [facility])   [facility] [place], [place], [place]   8 months ago Elevated glucose   [facility] [place], [place], [place]   1 year ago Neuroforaminal stenosis of lumbosacral spine   [facility] [place], [place], [place]      Future Appointments            In 4 months Gollan, Tollie Pizza, MD [facility], [facility]

## 2019-09-30 ENCOUNTER — Other Ambulatory Visit: Payer: Self-pay | Admitting: Physician Assistant

## 2019-09-30 DIAGNOSIS — K219 Gastro-esophageal reflux disease without esophagitis: Secondary | ICD-10-CM

## 2019-09-30 NOTE — Telephone Encounter (Signed)
Pt requested RF on pantoprazole. LR 09/15/19 # 360 pills. Refused- requested too soon. Routing to office to review.

## 2019-10-30 ENCOUNTER — Other Ambulatory Visit: Payer: Self-pay | Admitting: Physician Assistant

## 2019-10-30 DIAGNOSIS — I10 Essential (primary) hypertension: Secondary | ICD-10-CM

## 2019-10-30 DIAGNOSIS — I25118 Atherosclerotic heart disease of native coronary artery with other forms of angina pectoris: Secondary | ICD-10-CM

## 2019-10-30 NOTE — Telephone Encounter (Signed)
Requested Prescriptions  Pending Prescriptions Disp Refills  . losartan (COZAAR) 100 MG tablet [Pharmacy Med Name: LOSARTAN POTASSIUM 100 MG TAB] 30 tablet 0    Sig: TAKE ONE TABLET BY MOUTH DAILY     Cardiovascular:  Angiotensin Receptor Blockers Failed - 10/30/2019  7:10 PM      Failed - Cr in normal range and within 180 days    Creatinine, Ser  Date Value Ref Range Status  03/20/2019 1.16 0.61 - 1.24 mg/dL Final         Failed - K in normal range and within 180 days    Potassium  Date Value Ref Range Status  03/20/2019 3.9 3.5 - 5.1 mmol/L Final         Passed - Patient is not pregnant      Passed - Last BP in normal range    BP Readings from Last 1 Encounters:  07/18/19 115/72         Passed - Valid encounter within last 6 months    Recent Outpatient Visits          3 months ago New onset type 2 diabetes mellitus [facility])   [facility] [place], [place], [place]   4 months ago Acute idiopathic gout of left ankle   [facility] [place], [place], [place]   7 months ago Unstable angina [facility])   [facility] [place], [place], [place]   10 months ago Elevated glucose   [facility] [place], [place], [place]   1 year ago Neuroforaminal stenosis of lumbosacral spine   [facility] [place], [place], [place]      Future Appointments            In 1 month Cherre Huger, Art Buff, NP [facility] [place]   In 2 months Gollan, Tollie Pizza, MD [facility], [facility]

## 2019-11-30 ENCOUNTER — Other Ambulatory Visit: Payer: Self-pay

## 2019-11-30 ENCOUNTER — Other Ambulatory Visit: Payer: Self-pay | Admitting: Physician Assistant

## 2019-11-30 DIAGNOSIS — I10 Essential (primary) hypertension: Secondary | ICD-10-CM

## 2019-11-30 DIAGNOSIS — I25118 Atherosclerotic heart disease of native coronary artery with other forms of angina pectoris: Secondary | ICD-10-CM

## 2019-11-30 MED ORDER — POTASSIUM CHLORIDE ER 10 MEQ PO TBCR: 10.0000 meq | EXTENDED_RELEASE_TABLET | Freq: Two times a day (BID) | ORAL | 0 refills | Status: DC | PRN

## 2019-11-30 MED ORDER — LOSARTAN POTASSIUM 100 MG PO TABS: 100.0000 mg | ORAL_TABLET | Freq: Every day | ORAL | 3 refills | Status: DC

## 2019-12-01 ENCOUNTER — Other Ambulatory Visit: Payer: Self-pay | Admitting: Cardiovascular Disease

## 2019-12-05 ENCOUNTER — Ambulatory Visit: Admitting: Pulmonary Disease

## 2019-12-29 ENCOUNTER — Other Ambulatory Visit: Payer: Self-pay | Admitting: Physician Assistant

## 2019-12-29 DIAGNOSIS — E119 Type 2 diabetes mellitus without complications: Secondary | ICD-10-CM

## 2020-01-20 NOTE — Progress Notes (Signed)
Cardiology Office Note  Date:  01/22/2020   ID:  Jonathon Newman, DOB 05-Sep-1955, MRN [id number]  PCP:  Margaretann Loveless, PA-C   Chief Complaint  Patient presents with  . office visit    6 month F/U; Meds verbally reviewed with patient.    HPI:  Jonathon Newman is a 64 year old gentleman with past medical history of coronary artery disease  chronic diastolic heart failure.   Cypher drug-eluting stent placement to the RCA in 2010.    two-vessel coronary artery bypass graft in 2016 at [facility].   Hyperlipidemia Chronic dizziness date back to 2019 Chronic shortness of breath dating back several years OSA, wears CPAP Who presents for follow-up of his chronic diastolic CHF, CAD  Last clinic visit March 2021 Continued chronic dizziness, shortness of breath At that time was having nerve pain left side of his face Left shoulder pain Blood pressure elevated at times On my check systolic pressure 115 in the office  For chest pain and left shoulder symptoms stress test was ordered Oct 18, 2019 which was low risk study no significant ischemia ejection fraction 52%  Last cholesterol August 2019 total cholesterol 185 at that time No recent lipid panel since that time  Still working, in Education officer, environmental, desk job Active in the yard, weekends 25 bags of 80 pounds of cement, in driveway yesterday  Raked it out  Chronic SOB, rare chest and jaw pain, does not have nitro Chronic dizziness, dating back 2 years Concerned as brother had a brain tumor requiring surgery  Continues to take diuretics Torsemide 40 daily, metolazone 2x a week Trace swelling, worse at the end of the day  Lab work reviewed HBA1C 6.7 URIC ACID: 9.2 Last BMP 10 months ago  EKG personally reviewed by myself on todays visit nsr rate 59 bpm no ST changes  Other past medical history reviewed cardiac catheterization November 2019  medical management was recommended He was having symptoms of chest discomfort at the  time  diagnostic R/LHC on 05/02/2018 that showed significant underlying left main disease with patent grafts including LIMA to LAD and SVG to OM2. The RCA stent was patent with minimal restenosis. He had normal LVSF with a mildly elevated LVEDP at 14-15 mmHg.   02/2019 to the ER for chest pressure 4 days Ruled out, added hydralazine  He is wearing his C-PAP.  PMH:   has a past medical history of Arthritis, CHF (congestive heart failure) (HCC), Coronary artery disease, Diabetes mellitus without complication (HCC), Dyspnea, GERD (gastroesophageal reflux disease), Hypercholesteremia, Hypertension, S/P coronary artery bypass graft x 2, and Sleep apnea.  PSH:    Past Surgical History:  Procedure Laterality Date  . APPENDECTOMY    . BACK SURGERY  11/01/2017   SL 5 and S1  . CARDIAC CATHETERIZATION    . CARPAL TUNNEL RELEASE     left hand  . CHOLECYSTECTOMY    . CHOLECYSTECTOMY    . CORONARY ARTERY BYPASS GRAFT  04/21/2015   CABG x 2 [facility] LIMA to LAD amd SVG to OM2  . CORONARY STENT PLACEMENT  2010   Cordis Cypher Sirolimus-eluting stent 2.50 mm x 26 mm placed to the RCA at [facility]   . ESOPHAGEAL MANOMETRY N/A 08/04/2017   Procedure: ESOPHAGEAL MANOMETRY (EM);  Surgeon: Toney Reil, MD;  Location: [facility] ENDOSCOPY;  Service: Endoscopy;  Laterality: N/A;  . FRACTURE SURGERY    . KNEE SURGERY    . KNEE SURGERY  right knee   . LAMINECTOMY     "plate in neck L7-L8"  . LEFT HEART CATH AND CORS/GRAFTS ANGIOGRAPHY N/A 05/02/2018   Procedure: CORS/GRAFTS ANGIOGRAPHY;  Surgeon: Iran Ouch, MD;  Location: [facility] INVASIVE CV LAB;  Service: Cardiovascular;  Laterality: N/A;  . RIGHT/LEFT HEART CATH AND CORONARY ANGIOGRAPHY Bilateral 05/02/2018   Procedure: LEFT HEART CATH;  Surgeon: Iran Ouch, MD;  Location: [facility] INVASIVE CV LAB;  Service: Cardiovascular;  Laterality: Bilateral;  . VASECTOMY      Current Outpatient Medications  Medication Sig  Dispense Refill  . allopurinol (ZYLOPRIM) 100 MG tablet Take 2 tablets (200 mg total) by mouth daily. 180 tablet 1  . amLODipine (NORVASC) 5 MG tablet Take 1 tablet (5 mg total) by mouth 2 (two) times daily. 180 tablet 3  . aspirin 81 MG tablet Take 81 mg by mouth daily.    Marland Kitchen atorvastatin (LIPITOR) 40 MG tablet TAKE ONE TABLET BY MOUTH DAILY 90 tablet 3  . Blood Glucose Monitoring Suppl (FREESTYLE LITE) DEVI To check blood sugar once daily 1 each 0  . carvedilol (COREG) 25 MG tablet TAKE ONE TABLET BY MOUTH TWICE A DAY WITH MEALS 180 tablet 1  . glucose blood (FREESTYLE LITE) test strip To check blood sugar once daily 100 each 12  . indomethacin (INDOCIN) 50 MG capsule Take 1 capsule (50 mg total) by mouth 3 (three) times daily with meals. 90 capsule 1  . isosorbide mononitrate (IMDUR) 60 MG 24 hr tablet Take 1 tablet (60 mg total) by mouth daily. 30 tablet 2  . Lancets (FREESTYLE) lancets To check blood sugar once daily 100 each 12  . losartan (COZAAR) 100 MG tablet Take 1 tablet (100 mg total) by mouth daily. 90 tablet 3  . metFORMIN (GLUCOPHAGE) 500 MG tablet TAKE ONE TABLET BY MOUTH DAILY WITH BREAKFAST 90 tablet 0  . metolazone (ZAROXOLYN) 5 MG tablet Take 1 tablet (5 mg total) by mouth once a week. 90 tablet 3  . Multiple Vitamin (MULTIVITAMIN WITH MINERALS) TABS tablet Take 1 tablet by mouth daily.    . naproxen sodium (ALEVE) 220 MG tablet Take 440 mg by mouth 2 (two) times daily as needed (mild pain).    . pantoprazole (PROTONIX) 20 MG tablet TAKE TWO TABLETS BY MOUTH TWICE A DAY 360 tablet 0  . potassium chloride (KLOR-CON) 10 MEQ tablet Take 1 tablet (10 mEq total) by mouth 2 (two) times daily as needed. 180 tablet 0  . sildenafil (VIAGRA) 100 MG tablet Take 100 mg by mouth daily as needed for erectile dysfunction.    . torsemide (DEMADEX) 20 MG tablet Take 2 tablets (40 mg total) by mouth 2 (two) times daily. 120 tablet 4   No current facility-administered medications for this visit.     Allergies:   Colchicine and Lisinopril   Social History:  The patient  reports that he quit smoking about 14 years ago. His smoking use included cigarettes. He has a 0.50 pack-year smoking history. He has quit using smokeless tobacco.  His smokeless tobacco use included chew. He reports that he does not drink alcohol and does not use drugs.   Family History:   family history includes Arthritis in his mother; Cancer in his maternal grandfather; Cataracts in his maternal grandmother; Glaucoma in his maternal grandmother; Heart attack in his paternal grandfather; Heart attack (age of onset: 41) in his father; Heart murmur in his brother; Hyperlipidemia in his father; Hypertension in his father and paternal grandfather;  Valvular heart disease in his brother.    Review of Systems: Review of Systems  Constitutional: Negative.   HENT: Negative.   Respiratory: Negative.   Cardiovascular: Positive for chest pain.  Gastrointestinal: Negative.   Musculoskeletal: Negative.   Neurological: Positive for dizziness.  Psychiatric/Behavioral: Negative.   All other systems reviewed and are negative.    PHYSICAL EXAM: VS:  BP 116/70 (BP Location: Left Arm, Patient Position: Sitting, Cuff Size: Normal)   Pulse (!) 59   Ht 5\' 7"  (1.702 m)   Wt 230 lb (104.3 kg)   SpO2 98%   BMI 36.02 kg/m  , BMI Body mass index is 36.02 kg/m. Constitutional:  oriented to person, place, and time. No distress.  HENT:  Head: Grossly normal Eyes:  no discharge. No scleral icterus.  Neck: No JVD, no carotid bruits  Cardiovascular: Regular rate and rhythm, no murmurs appreciated Pulmonary/Chest: Clear to auscultation bilaterally, no wheezes or rails Abdominal: Soft.  no distension.  no tenderness.  Musculoskeletal: Normal range of motion Neurological:  normal muscle tone. Coordination normal. No atrophy Skin: Skin warm and dry Psychiatric: normal affect, pleasant   Recent Labs: 03/10/2019: ALT 68 03/11/2019:  Hemoglobin 13.0; Platelets 164 03/20/2019: BUN 22; Creatinine, Ser 1.16; Potassium 3.9; Sodium 141    Lipid Panel Lab Results  Component Value Date   CHOL 185 02/02/2018   HDL 35 (L) 02/02/2018   LDLCALC Comment 02/02/2018   TRIG 692 (HH) 02/02/2018      Wt Readings from Last 3 Encounters:  01/22/20 230 lb (104.3 kg)  07/18/19 237 lb 8 oz (107.7 kg)  07/17/19 235 lb (106.6 kg)      ASSESSMENT AND PLAN:  Acute on chronic diastolic congestive heart failure (HCC) -  Appears euvolemic, lab work performed today, no medication changes  Coronary artery disease involving native coronary artery of native heart with angina pectoris (HCC) Chronic angina, Has had prior catheterization, stress test last year low risk No further testing at this time, prescription for nitro provided  S/P coronary artery bypass graft x 2 - Plan: EKG 12-Lead Stress test as above, low risk  Hyperlipidemia, unspecified hyperlipidemia type Continue Lipitor 40 daily,  goal LDL less than 70 Lab work ordered today  Essential hypertension Long discussion concerning how to interpret results as detailed below Blood pressure well controlled today, discrepancy between his manual blood pressure cuff and manual check Manual check 10-15 points lower  Chronic dizziness Prior ENT work-up in the past  Prior MRI head 2017 no acute findings   Total encounter time more than 25 minutes  Greater than 50% was spent in counseling and coordination of care with the patient     Orders Placed This Encounter  Procedures  . EKG 12-Lead     Signed, 2018, M.D., Ph.D. 01/22/2020  [facility] [place], [place]

## 2020-01-22 ENCOUNTER — Encounter: Payer: Self-pay | Admitting: Cardiovascular Disease

## 2020-01-22 ENCOUNTER — Ambulatory Visit (INDEPENDENT_AMBULATORY_CARE_PROVIDER_SITE_OTHER): Admitting: Cardiovascular Disease

## 2020-01-22 ENCOUNTER — Other Ambulatory Visit: Payer: Self-pay

## 2020-01-22 VITALS — BP 116/70 | HR 59 | Ht 67.0 in | Wt 230.0 lb

## 2020-01-22 DIAGNOSIS — I5032 Chronic diastolic (congestive) heart failure: Secondary | ICD-10-CM

## 2020-01-22 DIAGNOSIS — I10 Essential (primary) hypertension: Secondary | ICD-10-CM | POA: Diagnosis not present

## 2020-01-22 DIAGNOSIS — I25118 Atherosclerotic heart disease of native coronary artery with other forms of angina pectoris: Secondary | ICD-10-CM | POA: Diagnosis not present

## 2020-01-22 DIAGNOSIS — Z6836 Body mass index (BMI) 36.0-36.9, adult: Secondary | ICD-10-CM

## 2020-01-22 DIAGNOSIS — E785 Hyperlipidemia, unspecified: Secondary | ICD-10-CM

## 2020-01-22 MED ORDER — NITROGLYCERIN 0.4 MG SL SUBL: 0.4000 mg | SUBLINGUAL_TABLET | SUBLINGUAL | 3 refills | Status: DC | PRN

## 2020-01-22 NOTE — Patient Instructions (Addendum)
Medication Instructions:  No changes  If you need a refill on your cardiac medications before your next appointment, please call your pharmacy.    Lab work: CMP, lipids    If you have labs (blood work) drawn today and your tests are completely normal, you will receive your results only by:  MyChart Message (if you have MyChart) OR  A paper copy in the mail If you have any lab test that is abnormal or we need to change your treatment, we will call you to review the results.   Testing/Procedures: No new testing needed   Follow-Up: At [facility], you and your health needs are our priority.  As part of our continuing mission to provide you with exceptional heart care, we have created designated Provider Care Teams.  These Care Teams include your primary Cardiologist (physician) and Advanced Practice Providers (APPs -  Physician Assistants and Nurse Practitioners) who all work together to provide you with the care you need, when you need it.   You will need a follow up appointment in 6 months    Providers on your designated Care Team:    Nicolasa Ducking, NP  Eula Listen, PA-C  Marisue Ivan, PA-C  Any Other Special Instructions Will Be Listed Below (If Applicable).  For educational health videos Log in to : [email or web address] Or : [email or web address], password : [email or web address]

## 2020-01-23 LAB — COMPREHENSIVE METABOLIC PANEL
ALT: 89 IU/L — ABNORMAL HIGH (ref 0–44)
AST: 65 IU/L — ABNORMAL HIGH (ref 0–40)
Albumin/Globulin Ratio: 1.8 (ref 1.2–2.2)
Albumin: 5.1 g/dL — ABNORMAL HIGH (ref 3.8–4.8)
Alkaline Phosphatase: 84 IU/L (ref 48–121)
BUN/Creatinine Ratio: 28 — ABNORMAL HIGH (ref 10–24)
BUN: 32 mg/dL — ABNORMAL HIGH (ref 8–27)
Bilirubin Total: 0.5 mg/dL (ref 0.0–1.2)
CO2: 20 mmol/L (ref 20–29)
Calcium: 10.2 mg/dL (ref 8.6–10.2)
Chloride: 96 mmol/L (ref 96–106)
Creatinine, Ser: 1.16 mg/dL (ref 0.76–1.27)
GFR calc Af Amer: 77 mL/min/{1.73_m2} (ref >59)
GFR calc non Af Amer: 67 mL/min/{1.73_m2} (ref >59)
Globulin, Total: 2.8 g/dL (ref 1.5–4.5)
Glucose: 205 mg/dL — ABNORMAL HIGH (ref 65–99)
Potassium: 4 mmol/L (ref 3.5–5.2)
Sodium: 138 mmol/L (ref 134–144)
Total Protein: 7.9 g/dL (ref 6.0–8.5)

## 2020-01-23 LAB — LIPID PANEL
Chol/HDL Ratio: 5.8 ratio — ABNORMAL HIGH (ref 0.0–5.0)
Cholesterol, Total: 209 mg/dL — ABNORMAL HIGH (ref 100–199)
HDL: 36 mg/dL — ABNORMAL LOW (ref >39)
LDL Chol Calc (NIH): 93 mg/dL (ref 0–99)
Triglycerides: 481 mg/dL — ABNORMAL HIGH (ref 0–149)
VLDL Cholesterol Cal: 80 mg/dL — ABNORMAL HIGH (ref 5–40)

## 2020-01-23 MED ORDER — ATORVASTATIN CALCIUM 40 MG PO TABS: 40.0000 mg | ORAL_TABLET | Freq: Every day | ORAL | 3 refills | Status: DC

## 2020-01-23 MED ORDER — AMLODIPINE BESYLATE 5 MG PO TABS: 5.0000 mg | ORAL_TABLET | Freq: Two times a day (BID) | ORAL | 3 refills | Status: DC

## 2020-01-23 MED ORDER — ISOSORBIDE MONONITRATE ER 60 MG PO TB24: 60.0000 mg | ORAL_TABLET | Freq: Every day | ORAL | 3 refills | Status: DC

## 2020-01-23 MED ORDER — CARVEDILOL 25 MG PO TABS: 25.0000 mg | ORAL_TABLET | Freq: Two times a day (BID) | ORAL | 3 refills | Status: DC

## 2020-01-23 NOTE — Addendum Note (Signed)
Addended by: Bryna Colander on: 01/23/2020 04:50 PM   Modules accepted: Orders

## 2020-01-26 ENCOUNTER — Other Ambulatory Visit: Payer: Self-pay | Admitting: *Deleted

## 2020-01-26 MED ORDER — EZETIMIBE 10 MG PO TABS: 10.0000 mg | ORAL_TABLET | Freq: Every day | ORAL | 3 refills | Status: DC

## 2020-02-24 ENCOUNTER — Other Ambulatory Visit: Payer: Self-pay | Admitting: Physician Assistant

## 2020-02-26 ENCOUNTER — Other Ambulatory Visit: Payer: Self-pay | Admitting: Cardiovascular Disease

## 2020-03-04 ENCOUNTER — Other Ambulatory Visit: Payer: Self-pay | Admitting: Physician Assistant

## 2020-03-04 DIAGNOSIS — M1009 Idiopathic gout, multiple sites: Secondary | ICD-10-CM

## 2020-03-23 ENCOUNTER — Other Ambulatory Visit: Payer: Self-pay | Admitting: Physician Assistant

## 2020-03-23 DIAGNOSIS — K219 Gastro-esophageal reflux disease without esophagitis: Secondary | ICD-10-CM

## 2020-03-27 ENCOUNTER — Other Ambulatory Visit: Payer: Self-pay | Admitting: Physician Assistant

## 2020-03-27 DIAGNOSIS — E119 Type 2 diabetes mellitus without complications: Secondary | ICD-10-CM

## 2020-03-27 NOTE — Telephone Encounter (Signed)
Attempted to call patient to schedule follow up- left message to call office. Courtesy refill given #30.

## 2020-03-29 ENCOUNTER — Ambulatory Visit (INDEPENDENT_AMBULATORY_CARE_PROVIDER_SITE_OTHER): Admitting: Physician Assistant

## 2020-03-29 ENCOUNTER — Other Ambulatory Visit: Payer: Self-pay

## 2020-03-29 ENCOUNTER — Encounter: Payer: Self-pay | Admitting: Physician Assistant

## 2020-03-29 VITALS — BP 146/81 | HR 61 | Temp 98.8°F | Resp 16 | Wt 237.4 lb

## 2020-03-29 DIAGNOSIS — M1009 Idiopathic gout, multiple sites: Secondary | ICD-10-CM | POA: Diagnosis not present

## 2020-03-29 DIAGNOSIS — E119 Type 2 diabetes mellitus without complications: Secondary | ICD-10-CM | POA: Diagnosis not present

## 2020-03-29 DIAGNOSIS — K2100 Gastro-esophageal reflux disease with esophagitis, without bleeding: Secondary | ICD-10-CM | POA: Diagnosis not present

## 2020-03-29 DIAGNOSIS — R42 Dizziness and giddiness: Secondary | ICD-10-CM | POA: Diagnosis not present

## 2020-03-29 DIAGNOSIS — Z794 Long term (current) use of insulin: Secondary | ICD-10-CM | POA: Diagnosis not present

## 2020-03-29 LAB — POCT GLYCOSYLATED HEMOGLOBIN (HGB A1C)
Est. average glucose Bld gHb Est-mCnc: 171
Hemoglobin A1C: 7.6 % — AB (ref 4.0–5.6)

## 2020-03-29 MED ORDER — ALLOPURINOL 100 MG PO TABS: 200.0000 mg | ORAL_TABLET | Freq: Every day | ORAL | 1 refills | Status: DC

## 2020-03-29 MED ORDER — PANTOPRAZOLE SODIUM 40 MG PO TBEC: 40.0000 mg | DELAYED_RELEASE_TABLET | Freq: Every day | ORAL | 1 refills | Status: DC

## 2020-03-29 MED ORDER — METFORMIN HCL 500 MG PO TABS: 500.0000 mg | ORAL_TABLET | Freq: Two times a day (BID) | ORAL | 1 refills | Status: DC

## 2020-03-29 NOTE — Progress Notes (Signed)
Established patient visit   Patient: Jonathon Newman   DOB: September 29, 1955   64 y.o. Male  MRN: [id number] Visit Date: 03/29/2020  Today's healthcare provider: Margaretann Loveless, PA-C   Chief Complaint  Patient presents with   Follow-up   Subjective    HPI  Gout: patient need refills on Allopurinol. Stable.  GERD: patient reports he feels stable with the Pantoprazole. Needs refills.  Diabetes Mellitus Type II, Follow-up  Lab Results  Component Value Date   HGBA1C 7.6 (A) 03/29/2020   HGBA1C 6.7 (H) 07/10/2019   HGBA1C 6.2 (A) 12/22/2018   Wt Readings from Last 3 Encounters:  03/29/20 237 lb 6.4 oz (107.7 kg)  01/22/20 230 lb (104.3 kg)  07/18/19 237 lb 8 oz (107.7 kg)   Last seen for diabetes 8 months ago.  Management since then includes Metformin mg daily. Patient is on Losartan. He reports excellent compliance with treatment. He is not having side effects.   Symptoms: Yes fatigue No foot ulcerations  No appetite changes No nausea  No paresthesia of the feet  No polydipsia  No polyuria No visual disturbances   No vomiting     Home blood sugar records: fasting range: 130-170's  Episodes of hypoglycemia? No    Current insulin regiment: none Current diet habits: in general, an "unhealthy" diet  Pertinent Labs: Lab Results  Component Value Date   CHOL 209 (H) 01/22/2020   HDL 36 (L) 01/22/2020   LDLCALC 93 01/22/2020   TRIG 481 (H) 01/22/2020   CHOLHDL 5.8 (H) 01/22/2020   Lab Results  Component Value Date   NA 138 01/22/2020   K 4.0 01/22/2020   CREATININE 1.16 01/22/2020   GFRNONAA 67 01/22/2020   GFRAA 77 01/22/2020   GLUCOSE 205 (H) 01/22/2020     ---------------------------------------------------------------------------------------------------  Patient Active Problem List   Diagnosis Date Noted   New onset type 2 diabetes mellitus (HCC) 07/17/2019   Prediabetes 03/15/2019   Benign paroxysmal positional vertigo due to bilateral  vestibular disorder 03/15/2019   Abnormal findings on diagnostic imaging of lung 03/15/2019   Chest pain 03/10/2019   Unstable angina (HCC) 04/25/2018   SOB (shortness of breath) 04/13/2018   Dizziness 04/13/2018   S/P coronary artery bypass graft x 2    Incomplete bladder emptying 07/21/2017   Low back pain 07/09/2017   Lumbar radiculopathy 07/09/2017   Gastroesophageal reflux disease with esophagitis 07/05/2017   Congestive heart failure (HCC) 07/05/2017   Benign prostatic hyperplasia with weak urinary stream 07/05/2017   Dysphagia 07/05/2017   DDD (degenerative disc disease), lumbar 07/05/2017   Coronary artery disease involving native coronary artery of native heart with angina pectoris (HCC) 07/31/2015   Essential hypertension 07/31/2015   Hyperlipidemia 07/31/2015   OSA (obstructive sleep apnea) 07/31/2015   Past Medical History:  Diagnosis Date   Arthritis    CHF (congestive heart failure) (HCC)    Coronary artery disease    PCI and Cypher drug-eluting stent placement to the right coronary artery in 2010.  NSTEMI in 2016.  Cardiac catheterization showed patent RCA stent, significant distal left main disease with an FFR ratio of 0.76 and ostial left circumflex stenosis.  Underwent CABG with LIMA to LAD and SVG to OM 2.   Diabetes mellitus without complication (HCC)    Dyspnea    GERD (gastroesophageal reflux disease)    Hypercholesteremia    Hypertension    S/P coronary artery bypass graft x 2    Sleep apnea  uses CPAP    Review of Systems  Constitutional: Negative.   Respiratory: Negative.   Cardiovascular: Negative.   Musculoskeletal: Positive for arthralgias, back pain and gait problem.  Neurological: Positive for numbness. Negative for weakness.    Last CBC Lab Results  Component Value Date   WBC 6.5 03/11/2019   HGB 13.0 03/11/2019   HCT 37.6 (L) 03/11/2019   MCV 89.1 03/11/2019   MCH 30.8 03/11/2019   RDW 13.3 03/11/2019    PLT 164 03/11/2019   Last metabolic panel Lab Results  Component Value Date   GLUCOSE 205 (H) 01/22/2020   NA 138 01/22/2020   K 4.0 01/22/2020   CL 96 01/22/2020   CO2 20 01/22/2020   BUN 32 (H) 01/22/2020   CREATININE 1.16 01/22/2020   GFRNONAA 67 01/22/2020   GFRAA 77 01/22/2020   CALCIUM 10.2 01/22/2020   PROT 7.9 01/22/2020   ALBUMIN 5.1 (H) 01/22/2020   LABGLOB 2.8 01/22/2020   AGRATIO 1.8 01/22/2020   BILITOT 0.5 01/22/2020   ALKPHOS 84 01/22/2020   AST 65 (H) 01/22/2020   ALT 89 (H) 01/22/2020   ANIONGAP 13 03/20/2019      Objective    BP (!) 146/81 (BP Location: Left Arm, Patient Position: Sitting, Cuff Size: Large)    Pulse 61    Temp 98.8 F (37.1 C) (Oral)    Resp 16    Wt 237 lb 6.4 oz (107.7 kg)    BMI 37.18 kg/m  BP Readings from Last 3 Encounters:  03/29/20 (!) 146/81  01/22/20 116/70  07/18/19 115/72   Wt Readings from Last 3 Encounters:  03/29/20 237 lb 6.4 oz (107.7 kg)  01/22/20 230 lb (104.3 kg)  07/18/19 237 lb 8 oz (107.7 kg)      Physical Exam Vitals reviewed.  Constitutional:      General: He is not in acute distress.    Appearance: Normal appearance. He is well-developed. He is obese. He is not ill-appearing or diaphoretic.  HENT:     Head: Normocephalic and atraumatic.  Neck:     Thyroid: No thyromegaly.     Vascular: No JVD.     Trachea: No tracheal deviation.  Cardiovascular:     Rate and Rhythm: Normal rate and regular rhythm.     Pulses: Normal pulses.     Heart sounds: Normal heart sounds. No murmur heard.  No friction rub. No gallop.   Pulmonary:     Effort: Pulmonary effort is normal. No respiratory distress.     Breath sounds: Normal breath sounds. No wheezing or rales.  Musculoskeletal:     Cervical back: Normal range of motion and neck supple. No tenderness.     Right lower leg: Edema present.     Left lower leg: Edema present.  Lymphadenopathy:     Cervical: No cervical adenopathy.  Skin:    General: Skin is  warm and dry.  Neurological:     Mental Status: He is alert.      Results for orders placed or performed in visit on 03/29/20  POCT glycosylated hemoglobin (Hb A1C)  Result Value Ref Range   Hemoglobin A1C 7.6 (A) 4.0 - 5.6 %   Est. average glucose Bld gHb Est-mCnc 171     Assessment & Plan     1. Type 2 diabetes mellitus without complication, with long-term current use of insulin (HCC) A1c increased from 6.7 to 7.6. Increase Metformin from once daily to twice daily dosing. Recheck A1c in 3  months.   2. Dizziness Ongoing issue that has become more frequent. Has been seen by ENT and advised not peripheral. Will order MRI as below.  - MR Brain W Wo Contrast; Future  3. Acute idiopathic gout of multiple sites Stable. Diagnosis pulled for medication refill. Continue current medical treatment plan. - allopurinol (ZYLOPRIM) 100 MG tablet; Take 2 tablets (200 mg total) by mouth daily.  Dispense: 180 tablet; Refill: 1  4. Gastroesophageal reflux disease with esophagitis without hemorrhage Change dose from Omeprazole to Pantoprazole. Increase to BID dosing. Call if not improving.  - pantoprazole (PROTONIX) 40 MG tablet; Take 1 tablet (40 mg total) by mouth twice daily.  Dispense: 180 tablet; Refill: 1   Return in about 3 months (around 06/29/2020), or if symptoms worsen or fail to improve.      Delmer Islam, PA-C, have reviewed all documentation for this visit. The documentation on 04/02/20 for the exam, diagnosis, procedures, and orders are all accurate and complete.   Reine Just  [facility] [phone] (phone) [phone] (fax)  [facility]

## 2020-04-01 ENCOUNTER — Telehealth: Payer: Self-pay | Admitting: Physician Assistant

## 2020-04-01 DIAGNOSIS — Z794 Long term (current) use of insulin: Secondary | ICD-10-CM

## 2020-04-01 DIAGNOSIS — E119 Type 2 diabetes mellitus without complications: Secondary | ICD-10-CM

## 2020-04-01 MED ORDER — METFORMIN HCL 500 MG PO TABS: 500.0000 mg | ORAL_TABLET | Freq: Two times a day (BID) | ORAL | 1 refills | Status: DC

## 2020-04-01 NOTE — Telephone Encounter (Signed)
Please Review

## 2020-04-01 NOTE — Telephone Encounter (Signed)
That must have been an old Rx. It had been sent on 03/29/20 with BID dosing. I have resent to [employer].

## 2020-04-01 NOTE — Telephone Encounter (Signed)
Pt states that when picking up prescription for metFORMIN (GLUCOPHAGE) 500 MG tablet it is written for once a day and he was told to take this twice a day. Pleas advise

## 2020-04-01 NOTE — Telephone Encounter (Signed)
LMTCB but Metforming dosing is BID. If patient calls back ok for [facility] nurse to give message. Thanks

## 2020-04-02 ENCOUNTER — Encounter: Payer: Self-pay | Admitting: Physician Assistant

## 2020-04-02 MED ORDER — PANTOPRAZOLE SODIUM 40 MG PO TBEC: 40.0000 mg | DELAYED_RELEASE_TABLET | Freq: Two times a day (BID) | ORAL | 1 refills | Status: DC

## 2020-04-02 NOTE — Telephone Encounter (Signed)
Call to patient- notified correct Rx has been sent to pharmacy- he states they had it on file- they had filled an old Rx.

## 2020-04-03 ENCOUNTER — Other Ambulatory Visit: Payer: Self-pay

## 2020-04-03 ENCOUNTER — Ambulatory Visit
Admission: RE | Admit: 2020-04-03 | Discharge: 2020-04-03 | Disposition: A | Discharge status: Home / Self Care | Source: Ambulatory Visit | Attending: Physician Assistant | Admitting: Physician Assistant

## 2020-04-03 DIAGNOSIS — R42 Dizziness and giddiness: Secondary | ICD-10-CM | POA: Diagnosis present

## 2020-04-03 MED ORDER — GADOBUTROL 1 MMOL/ML IV SOLN
10.0000 mL | Freq: Once | INTRAVENOUS | Status: AC | PRN
  Administered 2020-04-03: 10 mL via INTRAVENOUS

## 2020-04-04 ENCOUNTER — Telehealth: Payer: Self-pay

## 2020-04-04 NOTE — Telephone Encounter (Signed)
-----   Message from Margaretann Loveless, [place] sent at 04/04/2020 11:52 AM EDT ----- MRI is normal. No cause for dizziness or headaches noted.

## 2020-04-04 NOTE — Telephone Encounter (Signed)
Left message advising pt.  (Per DPR)  Thanks,   -Eppie Barhorst  

## 2020-05-20 ENCOUNTER — Ambulatory Visit (INDEPENDENT_AMBULATORY_CARE_PROVIDER_SITE_OTHER): Admitting: Adult Health

## 2020-05-20 ENCOUNTER — Other Ambulatory Visit: Payer: Self-pay

## 2020-05-20 ENCOUNTER — Encounter: Payer: Self-pay | Admitting: Adult Health

## 2020-05-20 DIAGNOSIS — G4733 Obstructive sleep apnea (adult) (pediatric): Secondary | ICD-10-CM

## 2020-05-20 NOTE — Assessment & Plan Note (Signed)
Healthy weight loss discussed 

## 2020-05-20 NOTE — Assessment & Plan Note (Signed)
Severe obstructive sleep apnea with excellent control compliance on nocturnal CPAP.  Plan  Patient Instructions  Continue on CPAP Keep up the good work Work on healthy weight loss Do not drive if sleepy  Follow-up with Dr. Belia Heman in 1 year and as needed

## 2020-05-20 NOTE — Patient Instructions (Addendum)
Continue on CPAP Keep up the good work Work on healthy weight loss Do not drive if sleepy  Follow-up with Dr. Belia Heman in 1 year and as needed

## 2020-05-20 NOTE — Progress Notes (Signed)
@Patient  ID: , male    DOB: 08-31-1955, 65 y.o.   MRN: [id number]  Chief Complaint  Patient presents with  . Follow-up    OSA     Referring provider: [id number]*  HPI: 64 year old male followed for obstructive sleep apnea Medical history significant for coronary disease, congestive heart failure  TEST/EVENTS :  HST 08/29/2018>> AHI 44, recommended CPAP auto with pressure range 5-20.  05/20/2020 Follow up : OSA  Patient presents for follow-up for obstructive sleep apnea.  Patient is on nocturnal CPAP.  Patient says he is doing very well.  Patient says he wears his CPAP every night cannot miss any nights.  Says he cannot sleep without his CPAP.  He wears it anywhere from 8 to 9 hours.  Patient feels rested with no difficult daytime sleepiness.  CPAP download shows excellent compliance with 100% usage.  Daily average usage at 9 hours.  Patient is on auto CPAP 8 to 20 cm H2O.  AHI 0.3.  Patient says his CPAP machine is relatively new in the last year.    Allergies  Allergen Reactions  . Colchicine Other (See Comments)    Palpitations and headache  . Lisinopril Cough    Immunization History  Administered Date(s) Administered  . Moderna SARS-COVID-2 Vaccination 11/28/2019, 12/28/2019  . Pneumococcal Polysaccharide-23 08/03/2012  . Tdap 08/03/2012  . Zoster Recombinat (Shingrix) 12/22/2018    Past Medical History:  Diagnosis Date  . Arthritis   . CHF (congestive heart failure) (HCC)   . Coronary artery disease    PCI and Cypher drug-eluting stent placement to the right coronary artery in 2010.  NSTEMI in 2016.  Cardiac catheterization showed patent RCA stent, significant distal left main disease with an FFR ratio of 0.76 and ostial left circumflex stenosis.  Underwent CABG with LIMA to LAD and SVG to OM 2.  . Diabetes mellitus without complication (HCC)   . Dyspnea   . GERD (gastroesophageal reflux disease)   . Hypercholesteremia   . Hypertension   .  S/P coronary artery bypass graft x 2   . Sleep apnea    uses CPAP    Tobacco History: Social History   Tobacco Use  Smoking Status Former Smoker  . Packs/day: 0.25  . Years: 2.00  . Pack years: 0.50  . Types: Cigarettes  . Quit date: 05/26/2005  . Years since quitting: 14.9  Smokeless Tobacco Former 14/05/2005  . Types: Chew   Counseling given: Not Answered   Outpatient Medications Prior to Visit  Medication Sig Dispense Refill  . allopurinol (ZYLOPRIM) 100 MG tablet Take 2 tablets (200 mg total) by mouth daily. 180 tablet 1  . amLODipine (NORVASC) 5 MG tablet Take 1 tablet (5 mg total) by mouth 2 (two) times daily. 180 tablet 3  . aspirin 81 MG tablet Take 81 mg by mouth daily.    Neurosurgeon atorvastatin (LIPITOR) 40 MG tablet Take 1 tablet (40 mg total) by mouth daily. 90 tablet 3  . Blood Glucose Monitoring Suppl (FREESTYLE LITE) DEVI To check blood sugar once daily 1 each 0  . carvedilol (COREG) 25 MG tablet Take 1 tablet (25 mg total) by mouth 2 (two) times daily with a meal. 180 tablet 3  . glucose blood (FREESTYLE LITE) test strip To check blood sugar once daily 100 each 12  . indomethacin (INDOCIN) 50 MG capsule TAKE ONE CAPSULE BY MOUTH THREE TIMES A DAY WITH MEALS 90 capsule 2  . isosorbide mononitrate (IMDUR) 60  MG 24 hr tablet Take 1 tablet (60 mg total) by mouth daily. 90 tablet 3  . Lancets (FREESTYLE) lancets To check blood sugar once daily 100 each 12  . losartan (COZAAR) 100 MG tablet Take 1 tablet (100 mg total) by mouth daily. 90 tablet 3  . metFORMIN (GLUCOPHAGE) 500 MG tablet Take 1 tablet (500 mg total) by mouth 2 (two) times daily with a meal. 180 tablet 1  . Multiple Vitamin (MULTIVITAMIN WITH MINERALS) TABS tablet Take 1 tablet by mouth daily.    . naproxen sodium (ALEVE) 220 MG tablet Take 440 mg by mouth 2 (two) times daily as needed (mild pain).    . nitroGLYCERIN (NITROSTAT) 0.4 MG SL tablet Place 1 tablet (0.4 mg total) under the tongue every 5 (five) minutes as  needed for chest pain. 25 tablet 3  . pantoprazole (PROTONIX) 40 MG tablet Take 1 tablet (40 mg total) by mouth 2 (two) times daily before a meal. 180 tablet 1  . potassium chloride (KLOR-CON) 10 MEQ tablet TAKE ONE TABLET BY MOUTH TWICE A DAY AS NEEDED 180 tablet 0  . torsemide (DEMADEX) 20 MG tablet TAKE TWO TABLETS BY MOUTH TWICE A DAY 120 tablet 4  . ezetimibe (ZETIA) 10 MG tablet Take 1 tablet (10 mg total) by mouth daily. 90 tablet 3   No facility-administered medications prior to visit.     Review of Systems:   Constitutional:   No  weight loss, night sweats,  Fevers, chills, fatigue, or  lassitude.  HEENT:   No headaches,  Difficulty swallowing,  Tooth/dental problems, or  Sore throat,                No sneezing, itching, ear ache, nasal congestion, post nasal drip,   CV:  No chest pain,  Orthopnea, PND, swelling in lower extremities, anasarca, dizziness, palpitations, syncope.   GI  No heartburn, indigestion, abdominal pain, nausea, vomiting, diarrhea, change in bowel habits, loss of appetite, bloody stools.   Resp: No shortness of breath with exertion or at rest.  No excess mucus, no productive cough,  No non-productive cough,  No coughing up of blood.  No change in color of mucus.  No wheezing.  No chest wall deformity  Skin: no rash or lesions.  GU: no dysuria, change in color of urine, no urgency or frequency.  No flank pain, no hematuria   MS:  No joint pain or swelling.  No decreased range of motion.  No back pain.    Physical Exam  BP 132/80 (BP Location: Left Arm, Cuff Size: Large)   Pulse 69   Temp 97.7 F (36.5 C) (Temporal)   Ht 5\' 7"  (1.702 m)   Wt 238 lb 6.4 oz (108.1 kg)   SpO2 96%   BMI 37.34 kg/m   GEN: A/Ox3; pleasant , NAD, well nourished, BMI at 37   HEENT:  [place]/AT,   NOSE-clear, THROAT-clear, no lesions, no postnasal drip or exudate noted.   NECK:  Supple w/ fair ROM; no JVD; normal carotid impulses w/o bruits; no thyromegaly or nodules  palpated; no lymphadenopathy.    RESP  Clear  P & A; w/o, wheezes/ rales/ or rhonchi. no accessory muscle use, no dullness to percussion  CARD:  RRR, no m/r/g, no peripheral edema, pulses intact, no cyanosis or clubbing.  GI:   Soft & nt; nml bowel sounds; no organomegaly or masses detected.   Musco: Warm bil, no deformities or joint swelling noted.   Neuro: alert,  no focal deficits noted.    Skin: Warm, no lesions or rashes    Lab Results:  BNP  ProBNP No results found for: PROBNP  Imaging: No results found.    No flowsheet data found.  No results found for: NITRICOXIDE      Assessment & Plan:   OSA (obstructive sleep apnea) Severe obstructive sleep apnea with excellent control compliance on nocturnal CPAP.  Plan  Patient Instructions  Continue on CPAP Keep up the good work Work on healthy weight loss Do not drive if sleepy  Follow-up with Dr. Belia Heman in 1 year and as needed      Morbid obesity (HCC) Healthy weight loss discussed     Rubye Oaks, NP 05/20/2020

## 2020-05-26 ENCOUNTER — Other Ambulatory Visit: Payer: Self-pay | Admitting: Cardiovascular Disease

## 2020-05-27 NOTE — Telephone Encounter (Signed)
Rx request sent to pharmacy.  

## 2020-05-28 NOTE — Progress Notes (Signed)
Established patient visit   Patient: Jonathon Newman   DOB: [birth year]   64 y.o. Male  MRN: [id number] Visit Date: 05/29/2020  Today's healthcare provider: Margaretann Loveless, PA-C   No chief complaint on file.  Subjective    Hip Pain  The incident occurred more than 1 week ago (worsening in the last 3 months). There was no injury mechanism. The pain is present in the right hip and left hip. The quality of the pain is described as aching ("needles"). The pain is severe. The pain has been constant since onset. Associated symptoms include an inability to bear weight (through out the whole day), numbness and tingling. Pertinent negatives include no muscle weakness. Associated symptoms comments: Reports that the pain comes from the middle of his lower back where he had his surgery. Some muscle weakness. He reports no foreign bodies present. The symptoms are aggravated by movement and weight bearing. He has tried elevation, ice and heat (Aleve and massage) for the symptoms. The treatment provided no relief.     Patient Active Problem List   Diagnosis Date Noted  . Morbid obesity (HCC) 05/20/2020  . New onset type 2 diabetes mellitus (HCC) 07/17/2019  . Prediabetes 03/15/2019  . Benign paroxysmal positional vertigo due to bilateral vestibular disorder 03/15/2019  . Abnormal findings on diagnostic imaging of lung 03/15/2019  . Chest pain 03/10/2019  . Unstable angina (HCC) 04/25/2018  . SOB (shortness of breath) 04/13/2018  . Dizziness 04/13/2018  . S/P coronary artery bypass graft x 2   . Incomplete bladder emptying 07/21/2017  . Low back pain 07/09/2017  . Lumbar radiculopathy 07/09/2017  . Gastroesophageal reflux disease with esophagitis 07/05/2017  . Congestive heart failure (HCC) 07/05/2017  . Benign prostatic hyperplasia with weak urinary stream 07/05/2017  . Dysphagia 07/05/2017  . DDD (degenerative disc disease), lumbar 07/05/2017  . Coronary artery disease involving  native coronary artery of native heart with angina pectoris (HCC) 07/31/2015  . Essential hypertension 07/31/2015  . Hyperlipidemia 07/31/2015  . OSA (obstructive sleep apnea) 07/31/2015   Past Medical History:  Diagnosis Date  . Arthritis   . CHF (congestive heart failure) (HCC)   . Coronary artery disease    PCI and Cypher drug-eluting stent placement to the right coronary artery in 2010.  NSTEMI in 2016.  Cardiac catheterization showed patent RCA stent, significant distal left main disease with an FFR ratio of 0.76 and ostial left circumflex stenosis.  Underwent CABG with LIMA to LAD and SVG to OM 2.  . Diabetes mellitus without complication (HCC)   . Dyspnea   . GERD (gastroesophageal reflux disease)   . Hypercholesteremia   . Hypertension   . S/P coronary artery bypass graft x 2   . Sleep apnea    uses CPAP       Medications: Outpatient Medications Prior to Visit  Medication Sig  . allopurinol (ZYLOPRIM) 100 MG tablet Take 2 tablets (200 mg total) by mouth daily.  Marland Kitchen amLODipine (NORVASC) 5 MG tablet Take 1 tablet (5 mg total) by mouth 2 (two) times daily.  Marland Kitchen aspirin 81 MG tablet Take 81 mg by mouth daily.  Marland Kitchen atorvastatin (LIPITOR) 40 MG tablet Take 1 tablet (40 mg total) by mouth daily.  . Blood Glucose Monitoring Suppl (FREESTYLE LITE) DEVI To check blood sugar once daily  . carvedilol (COREG) 25 MG tablet Take 1 tablet (25 mg total) by mouth 2 (two) times daily with a meal.  . glucose blood (FREESTYLE LITE)  test strip To check blood sugar once daily  . isosorbide mononitrate (IMDUR) 60 MG 24 hr tablet Take 1 tablet (60 mg total) by mouth daily.  . Lancets (FREESTYLE) lancets To check blood sugar once daily  . losartan (COZAAR) 100 MG tablet Take 1 tablet (100 mg total) by mouth daily.  . metFORMIN (GLUCOPHAGE) 500 MG tablet Take 1 tablet (500 mg total) by mouth 2 (two) times daily with a meal.  . Multiple Vitamin (MULTIVITAMIN WITH MINERALS) TABS tablet Take 1 tablet by  mouth daily.  . naproxen sodium (ALEVE) 220 MG tablet Take 440 mg by mouth 2 (two) times daily as needed (mild pain).  . nitroGLYCERIN (NITROSTAT) 0.4 MG SL tablet Place 1 tablet (0.4 mg total) under the tongue every 5 (five) minutes as needed for chest pain.  . pantoprazole (PROTONIX) 40 MG tablet Take 1 tablet (40 mg total) by mouth 2 (two) times daily before a meal.  . potassium chloride (KLOR-CON) 10 MEQ tablet TAKE ONE TABLET BY MOUTH TWICE A DAY AS NEEDED  . torsemide (DEMADEX) 20 MG tablet TAKE TWO TABLETS BY MOUTH TWICE A DAY  . [DISCONTINUED] indomethacin (INDOCIN) 50 MG capsule TAKE ONE CAPSULE BY MOUTH THREE TIMES A DAY WITH MEALS  . ezetimibe (ZETIA) 10 MG tablet Take 1 tablet (10 mg total) by mouth daily.   No facility-administered medications prior to visit.    Review of Systems  Constitutional: Negative.   Respiratory: Negative.   Cardiovascular: Negative.   Musculoskeletal: Positive for arthralgias, back pain, gait problem, joint swelling and myalgias.  Neurological: Positive for tingling, weakness and numbness.    Last CBC Lab Results  Component Value Date   WBC 6.5 03/11/2019   HGB 13.0 03/11/2019   HCT 37.6 (L) 03/11/2019   MCV 89.1 03/11/2019   MCH 30.8 03/11/2019   RDW 13.3 03/11/2019   PLT 164 03/11/2019   Last metabolic panel Lab Results  Component Value Date   GLUCOSE 205 (H) 01/22/2020   NA 138 01/22/2020   K 4.0 01/22/2020   CL 96 01/22/2020   CO2 20 01/22/2020   BUN 32 (H) 01/22/2020   CREATININE 1.16 01/22/2020   GFRNONAA 67 01/22/2020   GFRAA 77 01/22/2020   CALCIUM 10.2 01/22/2020   PROT 7.9 01/22/2020   ALBUMIN 5.1 (H) 01/22/2020   LABGLOB 2.8 01/22/2020   AGRATIO 1.8 01/22/2020   BILITOT 0.5 01/22/2020   ALKPHOS 84 01/22/2020   AST 65 (H) 01/22/2020   ALT 89 (H) 01/22/2020   ANIONGAP 13 03/20/2019      Objective    BP (!) 143/75 (BP Location: Right Arm, Patient Position: Sitting, Cuff Size: Large)   Pulse 63   Temp 98.3 F  (36.8 C) (Oral)   Resp 16   Wt 234 lb 12.8 oz (106.5 kg)   BMI 36.77 kg/m  BP Readings from Last 3 Encounters:  05/29/20 (!) 143/75  05/20/20 132/80  03/29/20 (!) 146/81   Wt Readings from Last 3 Encounters:  05/29/20 234 lb 12.8 oz (106.5 kg)  05/20/20 238 lb 6.4 oz (108.1 kg)  03/29/20 237 lb 6.4 oz (107.7 kg)      Physical Exam Vitals reviewed.  Constitutional:      General: He is not in acute distress.    Appearance: Normal appearance. He is well-developed and well-nourished. He is obese. He is not ill-appearing or diaphoretic.  HENT:     Head: Normocephalic and atraumatic.  Cardiovascular:     Rate and Rhythm: Normal  rate and regular rhythm.     Pulses: Normal pulses.     Heart sounds: Normal heart sounds. No murmur heard. No friction rub. No gallop.   Pulmonary:     Effort: Pulmonary effort is normal. No respiratory distress.     Breath sounds: Normal breath sounds. No wheezing or rales.  Musculoskeletal:     Cervical back: Normal range of motion and neck supple.     Lumbar back: Tenderness and bony tenderness present. Decreased range of motion. Positive right straight leg raise test and positive left straight leg raise test.     Right hip: Tenderness present. Decreased range of motion. Decreased strength.     Left hip: Tenderness present. Decreased range of motion. Decreased strength.  Neurological:     General: No focal deficit present.     Mental Status: He is alert. Mental status is at baseline.  Psychiatric:        Mood and Affect: Mood normal.      No results found for any visits on 05/29/20.  Assessment & Plan     1. New onset type 2 diabetes mellitus (HCC) Blood sugars still running elevated. Add Trulicity 0.75mg  weekly as below. Continue Metformin 500mg  BID. F/U in 3 months.  - Dulaglutide (TRULICITY) 0.75 MG/0.5ML SOPN; Inject 0.75 mg into the skin once a week.  Dispense: 3 mL; Refill: 4  2. DDD (degenerative disc disease), lumbar Long standing  history of back issues. Has required ESI and has had 4 back surgeries. Suspect worsening/progressive issues. Will get updated imaging as below. Add Lyrica low dose as below. F/U in 4 weeks.  - DG Lumbar Spine Complete; Future - pregabalin (LYRICA) 25 MG capsule; Take 1 capsule (25 mg total) by mouth 2 (two) times daily for 7 days, THEN 2 capsules (50 mg total) 2 (two) times daily for 23 days.  Dispense: 100 capsule; Refill: 0  3. Lumbar radiculopathy See above medical treatment plan. - DG Lumbar Spine Complete; Future - pregabalin (LYRICA) 25 MG capsule; Take 1 capsule (25 mg total) by mouth 2 (two) times daily for 7 days, THEN 2 capsules (50 mg total) 2 (two) times daily for 23 days.  Dispense: 100 capsule; Refill: 0  4. Bilateral hip pain Suspect more radiculopathy. Possible could have secondary OA as well. Will get imaging as below and F/U pending results.  - DG Hip Unilat W OR W/O Pelvis 2-3 Views Right; Future - DG Hip Unilat W OR W/O Pelvis 2-3 Views Left; Future   No follow-ups on file.      , PA-C, have reviewed all documentation for this visit. The documentation on 06/17/20 for the exam, diagnosis, procedures, and orders are all accurate and complete.   08/15/20  [facility] [phone] (phone) [phone] (fax)  [facility]

## 2020-05-29 ENCOUNTER — Ambulatory Visit (INDEPENDENT_AMBULATORY_CARE_PROVIDER_SITE_OTHER): Admitting: Physician Assistant

## 2020-05-29 ENCOUNTER — Encounter: Payer: Self-pay | Admitting: Physician Assistant

## 2020-05-29 ENCOUNTER — Other Ambulatory Visit: Payer: Self-pay

## 2020-05-29 VITALS — BP 143/75 | HR 63 | Temp 98.3°F | Resp 16 | Wt 234.8 lb

## 2020-05-29 DIAGNOSIS — M5416 Radiculopathy, lumbar region: Secondary | ICD-10-CM

## 2020-05-29 DIAGNOSIS — M25552 Pain in left hip: Secondary | ICD-10-CM

## 2020-05-29 DIAGNOSIS — M5136 Other intervertebral disc degeneration, lumbar region: Secondary | ICD-10-CM

## 2020-05-29 DIAGNOSIS — M25551 Pain in right hip: Secondary | ICD-10-CM

## 2020-05-29 DIAGNOSIS — E119 Type 2 diabetes mellitus without complications: Secondary | ICD-10-CM

## 2020-05-29 MED ORDER — TRULICITY 0.75 MG/0.5ML ~~LOC~~ SOAJ: 0.7500 mg | SUBCUTANEOUS | 4 refills | Status: DC

## 2020-05-29 MED ORDER — PREGABALIN 25 MG PO CAPS: ORAL_CAPSULE | ORAL | 0 refills | Status: DC

## 2020-05-29 MED ORDER — CYCLOBENZAPRINE HCL 10 MG PO TABS: 10.0000 mg | ORAL_TABLET | Freq: Three times a day (TID) | ORAL | 0 refills | Status: DC | PRN

## 2020-05-29 NOTE — Patient Instructions (Signed)
Pregabalin capsules What is this medicine? PREGABALIN (pre GAB a lin) is used to treat nerve pain from diabetes, shingles, spinal cord injury, and fibromyalgia. It is also used to control seizures in epilepsy. This medicine may be used for other purposes; ask your health care provider or pharmacist if you have questions. COMMON BRAND NAME(S): Lyrica What should I tell my health care provider before I take this medicine? They need to know if you have any of these conditions:  heart disease  history of drug abuse or alcohol abuse problem  kidney disease  lung or breathing disease  suicidal thoughts, plans, or attempt; a previous suicide attempt by you or a family member  an unusual or allergic reaction to pregabalin, gabapentin, other medicines, foods, dyes, or preservatives  pregnant or trying to get pregnant  breast-feeding How should I use this medicine? Take this medicine by mouth with a glass of water. Follow the directions on the prescription label. You can take it with or without food. If it upsets your stomach, take it with food. Take your medicine at regular intervals. Do not take it more often than directed. Do not stop taking except on your doctor's advice. A special MedGuide will be given to you by the pharmacist with each prescription and refill. Be sure to read this information carefully each time. Talk to your pediatrician regarding the use of this medicine in children. While this drug may be prescribed for children as young as 1 month for selected conditions, precautions do apply. Overdosage: If you think you have taken too much of this medicine contact a poison control center or emergency room at once. NOTE: This medicine is only for you. Do not share this medicine with others. What if I miss a dose? If you miss a dose, take it as soon as you can. If it is almost time for your next dose, take only that dose. Do not take double or extra doses. What may interact with this  medicine? This medicine may interact with the following medications:  alcohol  antihistamines for allergy, cough, and cold  certain medicines for anxiety or sleep  certain medicines for depression like amitriptyline, fluoxetine, sertraline  certain medicines for diabetes  certain medicines for seizures like phenobarbital, primidone  general anesthetics like halothane, isoflurane, methoxyflurane, propofol  local anesthetics like lidocaine, pramoxine, tetracaine  medicines that relax muscles for surgery  narcotic medicines for pain  phenothiazines like chlorpromazine, mesoridazine, prochlorperazine, thioridazine This list may not describe all possible interactions. Give your health care provider a list of all the medicines, herbs, non-prescription drugs, or dietary supplements you use. Also tell them if you smoke, drink alcohol, or use illegal drugs. Some items may interact with your medicine. What should I watch for while using this medicine? Tell your doctor or healthcare professional if your symptoms do not start to get better or if they get worse. Visit your doctor or health care professional for regular checks on your progress. Do not stop taking except on your doctor's advice. You may develop a severe reaction. Your doctor will tell you how much medicine to take. Wear a medical identification bracelet or chain if you are taking this medicine for seizures, and carry a card that describes your disease and details of your medicine and dosage times. You may get drowsy or dizzy. Do not drive, use machinery, or do anything that needs mental alertness until you know how this medicine affects you. Do not stand or sit up quickly, especially if   you are an older patient. This reduces the risk of dizzy or fainting spells. Alcohol may interfere with the effect of this medicine. Avoid alcoholic drinks. If you have a heart condition, like congestive heart failure, and notice that you are retaining  water and have swelling in your hands or feet, contact your health care provider immediately. The use of this medicine may increase the chance of suicidal thoughts or actions. Pay special attention to how you are responding while on this medicine. Any worsening of mood, or thoughts of suicide or dying should be reported to your health care professional right away. This medicine has caused reduced sperm counts in some men. This may interfere with the ability to father a child. You should talk to your doctor or health care professional if you are concerned about your fertility. Women who become pregnant while using this medicine for seizures may enroll in the [place] Antiepileptic Drug Pregnancy Registry by calling 1-888-233-2334. This registry collects information about the safety of antiepileptic drug use during pregnancy. What side effects may I notice from receiving this medicine? Side effects that you should report to your doctor or health care professional as soon as possible:  allergic reactions like skin rash, itching or hives, swelling of the face, lips, or tongue  breathing problems  changes in vision  chest pain  confusion  jerking or unusual movements of any part of your body  loss of memory  muscle pain, tenderness, or weakness  suicidal thoughts or other mood changes  swelling of the ankles, feet, hands  unusual bruising or bleeding Side effects that usually do not require medical attention (report to your doctor or health care professional if they continue or are bothersome):  dizziness  drowsiness  dry mouth  headache  nausea  tremors  trouble sleeping  weight gain This list may not describe all possible side effects. Call your doctor for medical advice about side effects. You may report side effects to FDA at 1-800-FDA-1088. Where should I keep my medicine? Keep out of the reach of children. This medicine can be abused. Keep your medicine in a safe  place to protect it from theft. Do not share this medicine with anyone. Selling or giving away this medicine is dangerous and against the law. This medicine may cause accidental overdose and death if it taken by other adults, children, or pets. Mix any unused medicine with a substance like cat litter or coffee grounds. Then throw the medicine away in a sealed container like a sealed bag or a coffee can with a lid. Do not use the medicine after the expiration date. Store at room temperature between 15 and 30 degrees C (59 and 86 degrees F). NOTE: This sheet is a summary. It may not cover all possible information. If you have questions about this medicine, talk to your doctor, pharmacist, or health care provider.  2020 Elsevier/Gold Standard (2018-06-03 13:15:55)  

## 2020-06-04 ENCOUNTER — Other Ambulatory Visit: Payer: Self-pay | Admitting: Physician Assistant

## 2020-06-04 DIAGNOSIS — M1009 Idiopathic gout, multiple sites: Secondary | ICD-10-CM

## 2020-06-06 ENCOUNTER — Other Ambulatory Visit: Payer: Self-pay

## 2020-06-06 ENCOUNTER — Ambulatory Visit
Admission: RE | Admit: 2020-06-06 | Discharge: 2020-06-06 | Disposition: A | Discharge status: Home / Self Care | Source: Ambulatory Visit | Attending: Physician Assistant | Admitting: Physician Assistant

## 2020-06-06 ENCOUNTER — Ambulatory Visit
Admission: RE | Admit: 2020-06-06 | Discharge: 2020-06-06 | Disposition: A | Discharge status: Home / Self Care | Attending: Physician Assistant | Admitting: Physician Assistant

## 2020-06-06 DIAGNOSIS — M5416 Radiculopathy, lumbar region: Secondary | ICD-10-CM | POA: Insufficient documentation

## 2020-06-06 DIAGNOSIS — M5136 Other intervertebral disc degeneration, lumbar region: Secondary | ICD-10-CM | POA: Insufficient documentation

## 2020-06-06 DIAGNOSIS — M25552 Pain in left hip: Secondary | ICD-10-CM

## 2020-06-06 DIAGNOSIS — M25551 Pain in right hip: Secondary | ICD-10-CM | POA: Insufficient documentation

## 2020-06-11 ENCOUNTER — Other Ambulatory Visit: Payer: Self-pay | Admitting: Physician Assistant

## 2020-06-11 DIAGNOSIS — M5416 Radiculopathy, lumbar region: Secondary | ICD-10-CM

## 2020-06-11 DIAGNOSIS — M5136 Other intervertebral disc degeneration, lumbar region: Secondary | ICD-10-CM

## 2020-06-11 NOTE — Telephone Encounter (Signed)
Requested medication (s) are due for refill today:yes  Requested medication (s) are on the active medication list: yes  Last refill: 05/29/20  #30  0 refills  Future visit scheduled  yes  06/26/20  Notes to clinic not delegated  Requested Prescriptions  Pending Prescriptions Disp Refills   cyclobenzaprine (FLEXERIL) 10 MG tablet [Pharmacy Med Name: CYCLOBENZAPRINE 10 MG TABLET] 30 tablet 0    Sig: TAKE ONE TABLET BY MOUTH THREE TIMES A DAY AS NEEDED FOR MUSCLE SPASMS      Not Delegated - Analgesics:  Muscle Relaxants Failed - 06/11/2020  6:31 PM      Failed - This refill cannot be delegated      Passed - Valid encounter within last 6 months    Recent Outpatient Visits           1 week ago New onset type 2 diabetes mellitus [facility])   [facility] [place], [place], PA-C   2 months ago Type 2 diabetes mellitus without complication, with long-term current use of insulin [facility])   [facility], [place], [place]   11 months ago New onset type 2 diabetes mellitus [facility])   [facility] [place], [place], [place]   12 months ago Acute idiopathic gout of left ankle   [facility] [place], [place], [place]   1 year ago Unstable angina [facility])   [facility] [place], Alessandra Bevels, [place]       Future Appointments             In 2 weeks Rosezetta Schlatter, [place], PA-C [facility]

## 2020-06-17 ENCOUNTER — Encounter: Payer: Self-pay | Admitting: Physician Assistant

## 2020-06-20 ENCOUNTER — Other Ambulatory Visit: Payer: Self-pay | Admitting: Physician Assistant

## 2020-06-20 DIAGNOSIS — M5416 Radiculopathy, lumbar region: Secondary | ICD-10-CM

## 2020-06-20 DIAGNOSIS — M5136 Other intervertebral disc degeneration, lumbar region: Secondary | ICD-10-CM

## 2020-06-20 NOTE — Telephone Encounter (Signed)
Requested medication (s) are due for refill today: na  Requested medication (s) are on the active medication list: yes  Last refill:  start:05/29/20 end: 06/28/20 #100 0 refills   Future visit scheduled: yes  Notes to clinic:  not delegated per protocol     Requested Prescriptions  Pending Prescriptions Disp Refills   pregabalin (LYRICA) 25 MG capsule [Pharmacy Med Name: PREGABALIN 25 MG CAPSULE] 100 capsule     Sig: TAKE ONE CAPSULE BY MOUTH TWICE A DAY, THEN TAKE TWO CAPSULES BY MOUTH TWICE A DAY      Not Delegated - Neurology:  Anticonvulsants - Controlled Failed - 06/20/2020  6:21 AM      Failed - This refill cannot be delegated      Passed - Valid encounter within last 12 months    Recent Outpatient Visits           3 weeks ago New onset type 2 diabetes mellitus [facility])   [facility] [place], [place], PA-C   2 months ago Type 2 diabetes mellitus without complication, with long-term current use of insulin [facility])   [facility] [place], [place], [place]   11 months ago New onset type 2 diabetes mellitus [facility])   [facility] [place], [place], [place]   1 year ago Acute idiopathic gout of left ankle   [facility] [place], [place], [place]   1 year ago Unstable angina [facility])   [facility] [place], [place]essandra [place], [place]       Future Appointments             In 1 week Rosezetta Schlatter, [place], PA-C [facility]

## 2020-06-21 NOTE — Telephone Encounter (Signed)
Can we see if this dose is working for him or if he is on the 50mg  twice daily? If so I can increase dose. If he is on 50mg  and not to goal I can increase to 75mg .

## 2020-06-21 NOTE — Telephone Encounter (Signed)
Jonathon Newman,  See my message below.  JB

## 2020-06-26 ENCOUNTER — Ambulatory Visit: Admitting: Physician Assistant

## 2020-06-26 NOTE — Telephone Encounter (Signed)
When he comes in on Friday 01/14

## 2020-06-26 NOTE — Telephone Encounter (Signed)
Can we see which dose he is taking please?

## 2020-06-26 NOTE — Telephone Encounter (Signed)
Per patient he is going to let us know.

## 2020-06-28 ENCOUNTER — Ambulatory Visit (INDEPENDENT_AMBULATORY_CARE_PROVIDER_SITE_OTHER): Admitting: Physician Assistant

## 2020-06-28 ENCOUNTER — Encounter: Payer: Self-pay | Admitting: Physician Assistant

## 2020-06-28 ENCOUNTER — Other Ambulatory Visit: Payer: Self-pay

## 2020-06-28 VITALS — BP 137/73 | HR 76 | Temp 98.4°F | Wt 241.4 lb

## 2020-06-28 DIAGNOSIS — I10 Essential (primary) hypertension: Secondary | ICD-10-CM

## 2020-06-28 DIAGNOSIS — M47817 Spondylosis without myelopathy or radiculopathy, lumbosacral region: Secondary | ICD-10-CM

## 2020-06-28 DIAGNOSIS — R3912 Poor urinary stream: Secondary | ICD-10-CM

## 2020-06-28 DIAGNOSIS — E1151 Type 2 diabetes mellitus with diabetic peripheral angiopathy without gangrene: Secondary | ICD-10-CM

## 2020-06-28 DIAGNOSIS — R339 Retention of urine, unspecified: Secondary | ICD-10-CM

## 2020-06-28 DIAGNOSIS — M5137 Other intervertebral disc degeneration, lumbosacral region: Secondary | ICD-10-CM | POA: Diagnosis not present

## 2020-06-28 DIAGNOSIS — N401 Enlarged prostate with lower urinary tract symptoms: Secondary | ICD-10-CM

## 2020-06-28 DIAGNOSIS — M5416 Radiculopathy, lumbar region: Secondary | ICD-10-CM

## 2020-06-28 MED ORDER — PREGABALIN 75 MG PO CAPS: ORAL_CAPSULE | ORAL | 1 refills | Status: DC

## 2020-06-28 MED ORDER — CYCLOBENZAPRINE HCL 10 MG PO TABS: ORAL_TABLET | ORAL | 5 refills | Status: DC

## 2020-06-28 NOTE — Patient Instructions (Signed)

## 2020-06-28 NOTE — Progress Notes (Signed)
Established patient visit   Patient: Jonathon Newman   DOB: 1955/07/02   64 y.o. Male  MRN: [id number] Visit Date: 06/28/2020  Today's healthcare provider: Margaretann Loveless, PA-C   Chief Complaint  Patient presents with  . Follow-up  I,Jennifer Verizon as a scribe for [employer], PA-C.,have documented all relevant documentation on the behalf of Margaretann Loveless, PA-C,as directed by  Margaretann Loveless, PA-C while in the presence of Margaretann Loveless, [place].  Subjective    HPI  Follow up for DDD (degenerative disc disease), lumbar  The patient was last seen for this 4 weeks ago. Changes made at last visit include added Lyrica low dose.  He reports good compliance with treatment. He feels that condition is Unchanged. He is not having side effects.   He does have history of disc issues requiring surgical intervention. He does report this feels similar to previous back issues prior to surgery.  He does also mention having a decreased stream and hesitancy. He does have history of BPH. -----------------------------------------------------------------------------------------   Medications: Outpatient Medications Prior to Visit  Medication Sig  . allopurinol (ZYLOPRIM) 100 MG tablet Take 2 tablets (200 mg total) by mouth daily.  Marland Kitchen amLODipine (NORVASC) 5 MG tablet Take 1 tablet (5 mg total) by mouth 2 (two) times daily.  Marland Kitchen aspirin 81 MG tablet Take 81 mg by mouth daily.  Marland Kitchen atorvastatin (LIPITOR) 40 MG tablet Take 1 tablet (40 mg total) by mouth daily.  . Blood Glucose Monitoring Suppl (FREESTYLE LITE) DEVI To check blood sugar once daily  . carvedilol (COREG) 25 MG tablet Take 1 tablet (25 mg total) by mouth 2 (two) times daily with a meal.  . Dulaglutide (TRULICITY) 0.75 MG/0.5ML SOPN Inject 0.75 mg into the skin once a week.  . ezetimibe (ZETIA) 10 MG tablet Take 1 tablet (10 mg total) by mouth daily.  Marland Kitchen glucose blood (FREESTYLE LITE) test strip To  check blood sugar once daily  . indomethacin (INDOCIN) 50 MG capsule TAKE ONE CAPSULE BY MOUTH THREE TIMES A DAY WITH MEALS  . isosorbide mononitrate (IMDUR) 60 MG 24 hr tablet Take 1 tablet (60 mg total) by mouth daily.  . Lancets (FREESTYLE) lancets To check blood sugar once daily  . losartan (COZAAR) 100 MG tablet Take 1 tablet (100 mg total) by mouth daily.  . metFORMIN (GLUCOPHAGE) 500 MG tablet Take 1 tablet (500 mg total) by mouth 2 (two) times daily with a meal.  . Multiple Vitamin (MULTIVITAMIN WITH MINERALS) TABS tablet Take 1 tablet by mouth daily.  . naproxen sodium (ALEVE) 220 MG tablet Take 440 mg by mouth 2 (two) times daily as needed (mild pain).  . nitroGLYCERIN (NITROSTAT) 0.4 MG SL tablet Place 1 tablet (0.4 mg total) under the tongue every 5 (five) minutes as needed for chest pain.  . pantoprazole (PROTONIX) 40 MG tablet Take 1 tablet (40 mg total) by mouth 2 (two) times daily before a meal.  . potassium chloride (KLOR-CON) 10 MEQ tablet TAKE ONE TABLET BY MOUTH TWICE A DAY AS NEEDED  . torsemide (DEMADEX) 20 MG tablet TAKE TWO TABLETS BY MOUTH TWICE A DAY  . [DISCONTINUED] cyclobenzaprine (FLEXERIL) 10 MG tablet TAKE ONE TABLET BY MOUTH THREE TIMES A DAY AS NEEDED FOR MUSCLE SPASMS  . [DISCONTINUED] pregabalin (LYRICA) 25 MG capsule Take 1 capsule (25 mg total) by mouth 2 (two) times daily for 7 days, THEN 2 capsules (50 mg total) 2 (two) times daily  for 23 days.   No facility-administered medications prior to visit.    Review of Systems  Constitutional: Positive for activity change and fatigue.  HENT: Negative.   Respiratory: Negative.   Cardiovascular: Negative.   Genitourinary: Positive for decreased urine volume.  Musculoskeletal: Positive for arthralgias, back pain, gait problem and myalgias.  Neurological: Positive for weakness and numbness.    Last CBC Lab Results  Component Value Date   WBC 6.5 03/11/2019   HGB 13.0 03/11/2019   HCT 37.6 (L) 03/11/2019    MCV 89.1 03/11/2019   MCH 30.8 03/11/2019   RDW 13.3 03/11/2019   PLT 164 03/11/2019   Last metabolic panel Lab Results  Component Value Date   GLUCOSE 205 (H) 01/22/2020   NA 138 01/22/2020   K 4.0 01/22/2020   CL 96 01/22/2020   CO2 20 01/22/2020   BUN 32 (H) 01/22/2020   CREATININE 1.16 01/22/2020   GFRNONAA 67 01/22/2020   GFRAA 77 01/22/2020   CALCIUM 10.2 01/22/2020   PROT 7.9 01/22/2020   ALBUMIN 5.1 (H) 01/22/2020   LABGLOB 2.8 01/22/2020   AGRATIO 1.8 01/22/2020   BILITOT 0.5 01/22/2020   ALKPHOS 84 01/22/2020   AST 65 (H) 01/22/2020   ALT 89 (H) 01/22/2020   ANIONGAP 13 03/20/2019   Last hemoglobin A1c Lab Results  Component Value Date   HGBA1C 7.6 (A) 03/29/2020      Objective    BP 137/73 (BP Location: Left Arm, Patient Position: Sitting, Cuff Size: Large)   Pulse 76   Temp 98.4 F (36.9 C) (Oral)   Wt 241 lb 6.4 oz (109.5 kg)   BMI 37.81 kg/m  BP Readings from Last 3 Encounters:  06/28/20 137/73  05/29/20 (!) 143/75  05/20/20 132/80   Wt Readings from Last 3 Encounters:  06/28/20 241 lb 6.4 oz (109.5 kg)  05/29/20 234 lb 12.8 oz (106.5 kg)  05/20/20 238 lb 6.4 oz (108.1 kg)      Physical Exam Vitals reviewed.  Constitutional:      General: He is not in acute distress.    Appearance: Normal appearance. He is well-developed and well-nourished. He is obese. He is not ill-appearing or diaphoretic.  HENT:     Head: Normocephalic and atraumatic.  Cardiovascular:     Rate and Rhythm: Normal rate and regular rhythm.     Pulses: Normal pulses.     Heart sounds: Normal heart sounds. No murmur heard. No friction rub. No gallop.   Pulmonary:     Effort: Pulmonary effort is normal. No respiratory distress.     Breath sounds: Normal breath sounds. No wheezing or rales.  Musculoskeletal:     Cervical back: Normal range of motion and neck supple.  Skin:    General: Skin is warm and dry.  Neurological:     Mental Status: He is alert.      No  results found for any visits on 06/28/20.  Assessment & Plan     1. DDD (degenerative disc disease), lumbosacral Worsening issue. In mornings he has to crawl to bathroom as he cannot stand upright. After a few minutes he is able to slowly stand and straighten up. Overall had been tolerating lyrica. Will increase dose to 75 mg as below, then he can increase to 150mg  if tolerating well. Flexeril for muscle spasms of back. Referral placed for further evaluation. Call if worsening.  - Ambulatory referral to Orthopedic Surgery - cyclobenzaprine (FLEXERIL) 10 MG tablet; TAKE ONE TABLET BY MOUTH  THREE TIMES A DAY AS NEEDED FOR MUSCLE SPASMS  Dispense: 90 tablet; Refill: 5 - pregabalin (LYRICA) 75 MG capsule; Take one capsule (75mg ) BID x 1-2 weeks and then increase to 2 capsules (150mg ) BID  Dispense: 120 capsule; Refill: 1  2. Facet arthropathy, lumbosacral See above medical treatment plan. - Ambulatory referral to Orthopedic Surgery - pregabalin (LYRICA) 75 MG capsule; Take one capsule (75mg ) BID x 1-2 weeks and then increase to 2 capsules (150mg ) BID  Dispense: 120 capsule; Refill: 1  3. Essential hypertension Stable. Continue Amlodipine 5mg , Carvedilol 25mg  BID, Imdur 60mg  daily, losartan 100mg . Will check labs as below and f/u pending results. - Comprehensive Metabolic Panel (CMET) - HgB A1c - Uric acid  4. Type 2 diabetes mellitus with diabetic peripheral angiopathy without gangrene, without long-term current use of insulin (HCC) Stable. Continue Trulicity 0.75mg  SQ weekly, metformin 500mg  BID. Will check labs as below and f/u pending results. - Comprehensive Metabolic Panel (CMET) - HgB A1c  5. Lumbar radiculopathy See above medical treatment plan for #1. - cyclobenzaprine (FLEXERIL) 10 MG tablet; TAKE ONE TABLET BY MOUTH THREE TIMES A DAY AS NEEDED FOR MUSCLE SPASMS  Dispense: 90 tablet; Refill: 5 - pregabalin (LYRICA) 75 MG capsule; Take one capsule (75mg ) BID x 1-2 weeks and then  increase to 2 capsules (150mg ) BID  Dispense: 120 capsule; Refill: 1  6. Incomplete bladder emptying Worsening recently. Will check labs as below and f/u pending results. May consider referral back to Urology if continues to worsen or if PSA is abnormal.  - Comprehensive Metabolic Panel (CMET) - PSA  7. Benign prostatic hyperplasia with weak urinary stream See above medical treatment plan. - PSA   Return in about 3 months (around 09/26/2020) for T2DM.      , PA-C, have reviewed all documentation for this visit. The documentation on 06/28/20 for the exam, diagnosis, procedures, and orders are all accurate and complete.    [facility] [phone] (phone) [phone] (fax)  [facility]

## 2020-06-29 LAB — PSA: Prostate Specific Ag, Serum: 5.3 ng/mL — ABNORMAL HIGH (ref 0.0–4.0)

## 2020-06-29 LAB — COMPREHENSIVE METABOLIC PANEL
ALT: 92 IU/L — ABNORMAL HIGH (ref 0–44)
AST: 63 IU/L — ABNORMAL HIGH (ref 0–40)
Albumin/Globulin Ratio: 1.7 (ref 1.2–2.2)
Albumin: 4.3 g/dL (ref 3.8–4.8)
Alkaline Phosphatase: 94 IU/L (ref 44–121)
BUN/Creatinine Ratio: 19 (ref 10–24)
BUN: 23 mg/dL (ref 8–27)
Bilirubin Total: 0.3 mg/dL (ref 0.0–1.2)
CO2: 19 mmol/L — ABNORMAL LOW (ref 20–29)
Calcium: 9.5 mg/dL (ref 8.6–10.2)
Chloride: 100 mmol/L (ref 96–106)
Creatinine, Ser: 1.22 mg/dL (ref 0.76–1.27)
GFR calc Af Amer: 72 mL/min/{1.73_m2} (ref >59)
GFR calc non Af Amer: 63 mL/min/{1.73_m2} (ref >59)
Globulin, Total: 2.6 g/dL (ref 1.5–4.5)
Glucose: 230 mg/dL — ABNORMAL HIGH (ref 65–99)
Potassium: 4.2 mmol/L (ref 3.5–5.2)
Sodium: 138 mmol/L (ref 134–144)
Total Protein: 6.9 g/dL (ref 6.0–8.5)

## 2020-06-29 LAB — URIC ACID: Uric Acid: 8.5 mg/dL — ABNORMAL HIGH (ref 3.8–8.4)

## 2020-06-29 LAB — HEMOGLOBIN A1C
Est. average glucose Bld gHb Est-mCnc: 154 mg/dL
Hgb A1c MFr Bld: 7 % — ABNORMAL HIGH (ref 4.8–5.6)

## 2020-07-21 ENCOUNTER — Encounter: Payer: Self-pay | Admitting: Physician Assistant

## 2020-07-21 DIAGNOSIS — E1151 Type 2 diabetes mellitus with diabetic peripheral angiopathy without gangrene: Secondary | ICD-10-CM

## 2020-07-21 DIAGNOSIS — I25119 Atherosclerotic heart disease of native coronary artery with unspecified angina pectoris: Secondary | ICD-10-CM

## 2020-07-21 DIAGNOSIS — I5042 Chronic combined systolic (congestive) and diastolic (congestive) heart failure: Secondary | ICD-10-CM

## 2020-07-23 ENCOUNTER — Telehealth: Payer: Self-pay | Admitting: *Deleted

## 2020-07-23 NOTE — Chronic Care Management (AMB) (Signed)
   Care Management   Outreach Note  07/23/2020 Name: Jonathon Newman MRN: [id number] DOB: [birth year]  Jonathon Newman is a 65 y.o. year old male who is a primary care patient of Reine Just. I reached out to Jonathon Newman by phone today in response to a referral sent by Jonathon Newman PCP, Jonathon Loveless, PA-C.  An unsuccessful telephone outreach was attempted today. The patient was referred to the case management team for assistance with care management and care coordination.   Follow Up Plan: A HIPAA compliant phone message was left for the patient providing contact information and requesting a return call. The care management team will reach out to the patient again over the next 7 days. If patient returns call to provider office, please advise to call Embedded Care Management Care Guide Gwenevere Ghazi at [phone].  Gwenevere Ghazi  Care Guide, Embedded Care Coordination [facility] Management

## 2020-07-24 NOTE — Chronic Care Management (AMB) (Signed)
  Care Management   Note  07/24/2020 Name: Jonathon Newman MRN: [id number] DOB: February 05, 1956  Jonathon Newman is a 65 y.o. year old male who is a primary care patient of Reine Just. I reached out to [name] by phone today in response to a referral sent by [name] PCP, Margaretann Loveless, PA-C.    Mr. Infinger was given information about care management services today including:  1. Care management services include personalized support from designated clinical staff supervised by his physician, including individualized plan of care and coordination with other care providers 2. 24/7 contact phone numbers for assistance for urgent and routine care needs. 3. The patient may stop care management services at any time by phone call to the office staff.  Patient agreed to services and verbal consent obtained.   Follow up plan: Telephone appointment with care management team member scheduled for: 07/29/2020  [facility] Guide, Embedded Care Coordination [facility] Management

## 2020-07-25 ENCOUNTER — Telehealth: Payer: Self-pay

## 2020-07-25 ENCOUNTER — Other Ambulatory Visit: Payer: Self-pay | Admitting: Cardiovascular Disease

## 2020-07-25 NOTE — Chronic Care Management (AMB) (Signed)
Chronic Care Management Pharmacy Assistant   Name: Jonathon Newman  MRN: [id number] DOB: 10/08/1955  Reason for Encounter: Patient Assistance Coordination  PCP : Margaretann Loveless, PA-C  07/25/2020-  Patient assistance forms filled out for Trulicity with [employer] patient assistance program.   07/31/2020- Called patient to see if he would like forms faxed, mailed or come to the office to sign.Spoke with patient he is ok with having forms mailed to him, he is aware that I will highlight all areas that need to be signed by him and filled out along with income documentation. Patient aware to return form to PCP office for signature and to be faxed. Angelena Sole, CPP notified.   Allergies:   Allergies  Allergen Reactions   Colchicine Other (See Comments)    Palpitations and headache   Lisinopril Cough    Medications: Outpatient Encounter Medications as of 07/25/2020  Medication Sig   allopurinol (ZYLOPRIM) 100 MG tablet Take 2 tablets (200 mg total) by mouth daily.   amLODipine (NORVASC) 5 MG tablet Take 1 tablet (5 mg total) by mouth 2 (two) times daily.   aspirin 81 MG tablet Take 81 mg by mouth daily.   atorvastatin (LIPITOR) 40 MG tablet Take 1 tablet (40 mg total) by mouth daily.   Blood Glucose Monitoring Suppl (FREESTYLE LITE) DEVI To check blood sugar once daily   carvedilol (COREG) 25 MG tablet Take 1 tablet (25 mg total) by mouth 2 (two) times daily with a meal.   cyclobenzaprine (FLEXERIL) 10 MG tablet TAKE ONE TABLET BY MOUTH THREE TIMES A DAY AS NEEDED FOR MUSCLE SPASMS   Dulaglutide (TRULICITY) 0.75 MG/0.5ML SOPN Inject 0.75 mg into the skin once a week.   ezetimibe (ZETIA) 10 MG tablet Take 1 tablet (10 mg total) by mouth daily.   glucose blood (FREESTYLE LITE) test strip To check blood sugar once daily   indomethacin (INDOCIN) 50 MG capsule TAKE ONE CAPSULE BY MOUTH THREE TIMES A DAY WITH MEALS   isosorbide mononitrate (IMDUR) 60 MG 24 hr tablet Take  1 tablet (60 mg total) by mouth daily.   Lancets (FREESTYLE) lancets To check blood sugar once daily   losartan (COZAAR) 100 MG tablet Take 1 tablet (100 mg total) by mouth daily.   metFORMIN (GLUCOPHAGE) 500 MG tablet Take 1 tablet (500 mg total) by mouth 2 (two) times daily with a meal.   Multiple Vitamin (MULTIVITAMIN WITH MINERALS) TABS tablet Take 1 tablet by mouth daily.   naproxen sodium (ALEVE) 220 MG tablet Take 440 mg by mouth 2 (two) times daily as needed (mild pain).   nitroGLYCERIN (NITROSTAT) 0.4 MG SL tablet Place 1 tablet (0.4 mg total) under the tongue every 5 (five) minutes as needed for chest pain.   pantoprazole (PROTONIX) 40 MG tablet Take 1 tablet (40 mg total) by mouth 2 (two) times daily before a meal.   potassium chloride (KLOR-CON) 10 MEQ tablet TAKE ONE TABLET BY MOUTH TWICE A DAY AS NEEDED   pregabalin (LYRICA) 75 MG capsule Take one capsule (75mg ) BID x 1-2 weeks and then increase to 2 capsules (150mg ) BID   torsemide (DEMADEX) 20 MG tablet TAKE TWO TABLETS BY MOUTH TWICE A DAY   No facility-administered encounter medications on file as of 07/25/2020.    Current Diagnosis: Patient Active Problem List   Diagnosis Date Noted   Morbid obesity (HCC) 05/20/2020   T2DM (type 2 diabetes mellitus) (HCC) 07/17/2019   Benign paroxysmal positional vertigo due to  bilateral vestibular disorder 03/15/2019   Abnormal findings on diagnostic imaging of lung 03/15/2019   Chest pain 03/10/2019   Unstable angina (HCC) 04/25/2018   SOB (shortness of breath) 04/13/2018   Dizziness 04/13/2018   S/P coronary artery bypass graft x 2    Incomplete bladder emptying 07/21/2017   Low back pain 07/09/2017   Lumbar radiculopathy 07/09/2017   Gastroesophageal reflux disease with esophagitis 07/05/2017   Congestive heart failure (HCC) 07/05/2017   Benign prostatic hyperplasia with weak urinary stream 07/05/2017   Dysphagia 07/05/2017   DDD (degenerative disc  disease), lumbar 07/05/2017   Coronary artery disease involving native coronary artery of native heart with angina pectoris (HCC) 07/31/2015   Essential hypertension 07/31/2015   Hyperlipidemia 07/31/2015   OSA (obstructive sleep apnea) 07/31/2015     Follow-Up:  Patient Assistance Coordination   Billee Cashing, [facility] Clinical Pharmacist Assistant [phone]

## 2020-07-29 ENCOUNTER — Ambulatory Visit (INDEPENDENT_AMBULATORY_CARE_PROVIDER_SITE_OTHER)

## 2020-07-29 DIAGNOSIS — E1151 Type 2 diabetes mellitus with diabetic peripheral angiopathy without gangrene: Secondary | ICD-10-CM | POA: Diagnosis not present

## 2020-07-29 DIAGNOSIS — I5042 Chronic combined systolic (congestive) and diastolic (congestive) heart failure: Secondary | ICD-10-CM

## 2020-07-29 NOTE — Progress Notes (Addendum)
 Chronic Care Management Pharmacy Note  07/30/2020 Name:  Jonathon Newman MRN:  [id number] DOB:  05/10/1956  Subjective: Jonathon Newman is an 64 y.o. year old male who is a primary patient of Burnette, [name], PA-C.  The CCM team was consulted for assistance with disease management and care coordination needs.    Engaged with patient by telephone for initial visit in response to provider referral for pharmacy case management and/or care coordination services.   Consent to Services:  The patient was given the following information about Chronic Care Management services today, agreed to services, and gave verbal consent: 1. CCM service includes personalized support from designated clinical staff supervised by the primary care provider, including individualized plan of care and coordination with other care providers 2. 24/7 contact phone numbers for assistance for urgent and routine care needs. 3. Service will only be billed when office clinical staff spend 20 minutes or more in a month to coordinate care. 4. Only one practitioner may furnish and bill the service in a calendar month. 5.The patient may stop CCM services at any time (effective at the end of the month) by phone call to the office staff. 6. The patient will be responsible for cost sharing (co-pay) of up to 20% of the service fee (after annual deductible is met). Patient agreed to services and consent obtained.  Patient Care Team: Burnette, [name], PA-C as PCP - General (Family Medicine) Gollan, Timothy J, MD as PCP - Cardiology (Cardiology) Fleury, Alexandre A, RPH (Pharmacist)  Recent office visits: 06/28/20: Patient presented to Jennifer Burnette, PA-C for follow-up. A1c improved to 7.0%. Lyrica increased to 75 mg BID and 150 mg BID after 2 weeks 05/29/20: Patient presented to Jennifer Burnette, PA-C for follow-up. Patient started on Lyrica for DDD. Patient started on Trulicity 0.75 mg weekly.    Recent consult  visits: 01/22/20: Patient presented to Dr. Gollan (Cardiology) for follow-up. Sildenafil stopped. Nitroglycerin started.   Hospital visits: None in previous 6 months  Objective:  Lab Results  Component Value Date   CREATININE 1.22 06/28/2020   BUN 23 06/28/2020   GFRNONAA 63 06/28/2020   GFRAA 72 06/28/2020   NA 138 06/28/2020   K 4.2 06/28/2020   CALCIUM 9.5 06/28/2020   CO2 19 (L) 06/28/2020    Lab Results  Component Value Date/Time   HGBA1C 7.0 (H) 06/28/2020 09:42 AM   HGBA1C 7.6 (A) 03/29/2020 06:21 PM   HGBA1C 6.7 (H) 07/10/2019 01:47 PM   MICROALBUR 50 07/17/2019 07:26 PM    Last diabetic Eye exam: No results found for: HMDIABEYEEXA  Last diabetic Foot exam: No results found for: HMDIABFOOTEX   Lab Results  Component Value Date   CHOL 209 (H) 01/22/2020   HDL 36 (L) 01/22/2020   LDLCALC 93 01/22/2020   TRIG 481 (H) 01/22/2020   CHOLHDL 5.8 (H) 01/22/2020    Hepatic Function Latest Ref Rng & Units 06/28/2020 01/22/2020 03/10/2019  Total Protein 6.0 - 8.5 g/dL 6.9 7.9 7.5  Albumin 3.8 - 4.8 g/dL 4.3 5.1(H) 4.2  AST 0 - 40 IU/L 63(H) 65(H) 47(H)  ALT 0 - 44 IU/L 92(H) 89(H) 68(H)  Alk Phosphatase 44 - 121 IU/L 94 84 59  Total Bilirubin 0.0 - 1.2 mg/dL 0.3 0.5 0.8  Bilirubin, Direct 0.1 - 0.5 mg/dL - - -    Lab Results  Component Value Date/Time   TSH 1.600 02/02/2018 04:19 PM    CBC Latest Ref Rng & Units 03/11/2019 03/10/2019 03/10/2019  WBC   4.0 - 10.5 K/uL 6.5 7.4 6.7  Hemoglobin 13.0 - 17.0 g/dL 13.0 14.2 15.0  Hematocrit 39.0 - 52.0 % 37.6(L) 40.0 41.7  Platelets 150 - 400 K/uL 164 204 211    No results found for: VD25OH  Clinical ASCVD: Yes  The 10-year ASCVD risk score (Goff DC Jr., et al., 2013) is: 43.5%   Values used to calculate the score:     Age: 64 years     Sex: Male     Is Non-Hispanic African American: No     Diabetic: Yes     Tobacco smoker: Yes     Systolic Blood Pressure: 137 mmHg     Is BP treated: Yes     HDL Cholesterol: 36  mg/dL     Total Cholesterol: 209 mg/dL    Depression screen PHQ 2/9 03/29/2020 02/02/2018 06/30/2017  Decreased Interest 0 0 0  Down, Depressed, Hopeless 0 0 0  PHQ - 2 Score 0 0 0  Altered sleeping - 0 1  Tired, decreased energy - 0 2  Change in appetite - 0 0  Feeling bad or failure about yourself  - 0 0  Trouble concentrating - 0 0  Moving slowly or fidgety/restless - 0 0  Suicidal thoughts - 0 0  PHQ-9 Score - 0 3  Difficult doing work/chores - Not difficult at all Not difficult at all     Social History   Tobacco Use  Smoking Status Former Smoker  . Packs/day: 0.25  . Years: 2.00  . Pack years: 0.50  . Types: Cigarettes  . Quit date: 05/26/2005  . Years since quitting: 15.1  Smokeless Tobacco Former User  . Types: Chew   BP Readings from Last 3 Encounters:  06/28/20 137/73  05/29/20 (!) 143/75  05/20/20 132/80   Pulse Readings from Last 3 Encounters:  06/28/20 76  05/29/20 63  05/20/20 69   Wt Readings from Last 3 Encounters:  06/28/20 241 lb 6.4 oz (109.5 kg)  05/29/20 234 lb 12.8 oz (106.5 kg)  05/20/20 238 lb 6.4 oz (108.1 kg)    Assessment/Interventions: Review of patient past medical history, allergies, medications, health status, including review of consultants reports, laboratory and other test data, was performed as part of comprehensive evaluation and provision of chronic care management services.   SDOH:  (Social Determinants of Health) assessments and interventions performed: Yes SDOH Interventions   Flowsheet Row Most Recent Value  SDOH Interventions   Financial Strain Interventions Other (Comment)  [PAP]  Transportation Interventions Intervention Not Indicated      CCM Care Plan  Allergies  Allergen Reactions  . Colchicine Other (See Comments)    Palpitations and headache  . Lisinopril Cough    Medications Reviewed Today    Reviewed by Fleury, Alexandre A, RPH (Pharmacist) on 07/29/20 at 1305  Med List Status: <None>  Medication  Order Taking? Sig Documenting Provider Last Dose Status Informant  allopurinol (ZYLOPRIM) 100 MG tablet [id number] Yes Take 2 tablets (200 mg total) by mouth daily. Burnette, [name], PA-C Taking Active   amLODipine (NORVASC) 5 MG tablet [id number] Yes Take 1 tablet (5 mg total) by mouth 2 (two) times daily. Gollan, Timothy J, MD Taking Active   aspirin 81 MG tablet [id number] Yes Take 81 mg by mouth daily. [provider] Taking Active Self  atorvastatin (LIPITOR) 40 MG tablet [id number] Yes Take 1 tablet (40 mg total) by mouth daily. Gollan, Timothy J, MD Taking Active   Blood Glucose Monitoring Suppl (  FREESTYLE LITE) DEVI [id number]  To check blood sugar once daily Burnette, [name], PA-C  Active   carvedilol (COREG) 25 MG tablet [id number] Yes Take 1 tablet (25 mg total) by mouth 2 (two) times daily with a meal. Gollan, Timothy J, MD Taking Active   cyclobenzaprine (FLEXERIL) 10 MG tablet [id number] Yes TAKE ONE TABLET BY MOUTH THREE TIMES A DAY AS NEEDED FOR MUSCLE SPASMS Burnette, [name], PA-C Taking Active   Dulaglutide (TRULICITY) 0.75 MG/0.5ML SOPN [id number]  Inject 0.75 mg into the skin once a week. Burnette, [name], PA-C  Active   ezetimibe (ZETIA) 10 MG tablet [id number]  Take 1 tablet (10 mg total) by mouth daily. Gollan, Timothy J, MD  Expired 04/25/20 2359   glucose blood (FREESTYLE LITE) test strip [id number]  To check blood sugar once daily Burnette, [name], PA-C  Active   indomethacin (INDOCIN) 50 MG capsule [id number] Yes TAKE ONE CAPSULE BY MOUTH THREE TIMES A DAY WITH MEALS Burnette, [name], PA-C Taking Active   isosorbide mononitrate (IMDUR) 60 MG 24 hr tablet [id number] Yes Take 1 tablet (60 mg total) by mouth daily. Gollan, Timothy J, MD Taking Active   Lancets (FREESTYLE) lancets [id number]  To check blood sugar once daily Burnette, [name], PA-C  Active   losartan (COZAAR) 100 MG tablet [id number] Yes Take 1 tablet (100 mg total) by mouth daily. Burnette,  [name], PA-C Taking Active   meloxicam (MOBIC) 7.5 MG tablet [id number] Yes Take 7.5 mg by mouth daily. [provider] Taking Active   metFORMIN (GLUCOPHAGE) 500 MG tablet [id number] Yes Take 1 tablet (500 mg total) by mouth 2 (two) times daily with a meal. Burnette, [name], PA-C Taking Active   methylPREDNISolone (MEDROL) 4 MG tablet [id number] Yes Take dose pack as instructed daily for 21 days [provider] Taking Active   Multiple Vitamin (MULTIVITAMIN WITH MINERALS) TABS tablet [id number] Yes Take 1 tablet by mouth daily. [provider] Taking Active Self  naproxen sodium (ALEVE) 220 MG tablet [id number] Yes Take 440 mg by mouth 2 (two) times daily as needed (mild pain). [provider] Taking Active Self  nitroGLYCERIN (NITROSTAT) 0.4 MG SL tablet [id number]  Place 1 tablet (0.4 mg total) under the tongue every 5 (five) minutes as needed for chest pain. Gollan, Timothy J, MD  Active   pantoprazole (PROTONIX) 40 MG tablet [id number] Yes Take 1 tablet (40 mg total) by mouth 2 (two) times daily before a meal. Burnette, [name], PA-C Taking Active   potassium chloride (KLOR-CON) 10 MEQ tablet [id number] Yes TAKE ONE TABLET BY MOUTH TWICE A DAY AS NEEDED Gollan, Timothy J, MD Taking Active   pregabalin (LYRICA) 75 MG capsule [id number] Yes Take one capsule (75mg) BID x 1-2 weeks and then increase to 2 capsules (150mg) BID Burnette, [name], PA-C Taking Active   torsemide (DEMADEX) 20 MG tablet [id number] Yes TAKE TWO TABLETS BY MOUTH TWICE A DAY Gollan, Timothy J, MD Taking Active           Patient Active Problem List   Diagnosis Date Noted  . Morbid obesity (HCC) 05/20/2020  . T2DM (type 2 diabetes mellitus) (HCC) 07/17/2019  . Benign paroxysmal positional vertigo due to bilateral vestibular disorder 03/15/2019  . Abnormal findings on diagnostic imaging of lung 03/15/2019  . Chest pain 03/10/2019  . Unstable angina (HCC) 04/25/2018  . SOB  (shortness of breath) 04/13/2018  . Dizziness 04/13/2018  . S/P coronary artery bypass graft x 2   .   Incomplete bladder emptying 07/21/2017  . Low back pain 07/09/2017  . Lumbar radiculopathy 07/09/2017  . Gastroesophageal reflux disease with esophagitis 07/05/2017  . Congestive heart failure (HCC) 07/05/2017  . Benign prostatic hyperplasia with weak urinary stream 07/05/2017  . Dysphagia 07/05/2017  . DDD (degenerative disc disease), lumbar 07/05/2017  . Coronary artery disease involving native coronary artery of native heart with angina pectoris (HCC) 07/31/2015  . Essential hypertension 07/31/2015  . Hyperlipidemia 07/31/2015  . OSA (obstructive sleep apnea) 07/31/2015    Immunization History  Administered Date(s) Administered  . Moderna Sars-Covid-2 Vaccination 11/28/2019, 12/28/2019  . Pneumococcal Polysaccharide-23 08/03/2012  . Tdap 08/03/2012  . Zoster Recombinat (Shingrix) 12/22/2018    Conditions to be addressed/monitored:  Hypertension, Hyperlipidemia, Diabetes, Heart Failure, Coronary Artery Disease, GERD and Gout  Care Plan : General Pharmacy (Adult)  Updates made by Fleury, Alexandre A, RPH since 07/30/2020 12:00 AM    Problem: Hypertension, Hyperlipidemia, Diabetes, Heart Failure, Coronary Artery Disease, GERD and Gout   Priority: High    Goal: Patient-Specific Goal   Start Date: 07/29/2020  Expected End Date: 01/27/2021  This Visit's Progress: On track  Priority: High  Note:   Current Barriers:  . Unable to independently afford treatment regimen  Pharmacist Clinical Goal(s):  . Over the next 90 days, patient will verbalize ability to afford treatment regimen through collaboration with PharmD and provider.   Interventions: . 1:1 collaboration with Burnette, [name], PA-C regarding development and update of comprehensive plan of care as evidenced by provider attestation and co-signature . Inter-disciplinary care team collaboration (see longitudinal plan  of care) . Comprehensive medication review performed; medication list updated in electronic medical record  Hypertension (BP goal <140/90) -controlled -Current treatment: . Amlodipine 5 mg twice daily  . Carvedilol 25 mg twice daily  . Imdur 60 mg daily  . Losartan 100 mg daily  . Torsemide 20 mg 2 tablets twice daily  -Medications previously tried: NA   -Current home readings: Patient was told by cardiology he does not need to routinely monitor -Current dietary habits: Patient does not follow any specific dietary regimen -Current exercise habits: Patient has unable to get regular exercise due to his back pain, but is planning on restarting PT soon.  -Significant peripheral edema despite diuretic use. Patient tries to keep his legs elevated, and will rarely wear compression stockings.  -Denies hypotensive/hypertensive symptoms -Educated on BP goals and benefits of medications for prevention of heart attack, stroke and kidney damage; Daily salt intake goal < 2300 mg; -Counseled to monitor BP at home if symptomatic, document, and provide log at future appointments -Recommended decreasing amlodipine to 5 mg at bedtime to see if peripheral edema improves   Hyperlipidemia: (LDL goal < 70) -controlled -Current treatment: . Atorvastatin 40 mg daily . Zetia 10 mg daily  -Medications previously tried: NA   -Educated on Importance of limiting foods high in cholesterol; -Recommended to continue current medication  Diabetes (A1c goal <7%) -controlled -Current medications: . Metformin 500 mg twice daily  . Trulicity 0.75 mg weekly (used last pen on 07/21/2020)  -Medications previously tried: NA   -Current home glucose readings . fasting glucose: Reported range 130-170 . post prandial glucose: NA -Denies hypoglycemic/hyperglycemic symptoms -Educated onA1c and blood sugar goals; Benefits of routine self-monitoring of blood sugar; -Counseled to check feet daily and get yearly eye  exams -Recommended to continue current medication Assessed patient finances. Patient application for Trulicity in progress. Will contact patient when patient assistance is ready to be   mailed.   Heart Failure (Goal: control symptoms and prevent exacerbations) controlled Type: Diastolic -NYHA Class: II (slight limitation of activity) -Ejection fraction: NA (Date: NA) -Current treatment: . Amlodipine 5 mg twice daily  . Carvedilol 25 mg twice daily  . Imdur 60 mg daily  . Losartan 100 mg daily   Torsemide 20 mg 2 tablets twice daily -Medications previously tried: NA -Educated on Importance of weighing daily; if you gain more than 3 pounds in one day or 5 pounds in one week, contact provider's office Proper diuretic administration and potassium supplementation -Recommended decreasing amlodipine to 5 mg daily   Chronic Pain (Goal: Reduce pain and improve quality of life) -uncontrolled -Current treatment  . Cyclobenzaprine 10 mg three times daily as needed - Muscle Spasms (takes most days)  . Indomethacin 50 mg three times daily  . Naproxen 220 mg 2 tablets twice daily as needed . Pregabalin 75 mg two tablets twice daily   Meloxicam 7.5 mg daily  Methylpredisone 4 mg ( for 21 days)  -Medications previously tried: NA  - Patient reports worsening dry mouth in past 1-2 months. Counseled it may be a side effect from increased Pregabalin dose.  -Recently seen at Ortho, prescribed meloxicam + steroid dose pack. Counseled patient on risks of NSAID use and advised to minimize use of indomethacin + naproxen while taking daily meloxicam.  -Recommended to continue current medication  Gout (Goal: Prevent gout flares) -uncontrolled -Current treatment  . Allopurinol 100 mg twice daily  -Last gout flare: 2 weeks ago. Still has flares frequently, but feels they are less severe than prior to allopurinol.  -Medications previously tried: NA  -Recommended increasing allopurinol to 300 mg daily.    GERD (Goal: prevent symptoms of heartburn and reflux ) -controlled -Current treatment  . Pantoprazole 40 mg twice daily  -Medications previously tried: NA  -A few instances of GERD symptoms since starting Trulicity. Patient reports reflux when lying down after eating dinner. It has improved since he has started propping him self up at night.  -Counseled on diet and exercise extensively  Patient Goals/Self-Care Activities . Over the next 90 days, patient will:  - check glucose daily, document, and provide at future appointments check blood pressure if feeling symptoms of low blood pressure, document, and provide at future appointments  Follow Up Plan: Telephone follow up appointment with care management team member scheduled for: 10/28/20 at 1:00 PM      Medication Assistance: Application for Trulicity   medication assistance program. in process.  Anticipated assistance start date 08/26/20.  See plan of care for additional detail.  Patient's preferred pharmacy is:  [facility], [address] [place] [address] Phone: [phone] Fax: [phone]  [id number] [id number]: [phone]: [phone] Uses pill box? Yes Pt endorses 100% compliance  We discussed: Current pharmacy is preferred with insurance plan and patient is satisfied with pharmacy services Patient decided to: Continue current medication management strategy  Care Plan and Follow Up Patient Decision:  Patient agrees to Care Plan and Follow-up.  Plan: Telephone follow up appointment with care management team member scheduled for:  10/28/20 at 1:00 PM   [phone]

## 2020-07-30 ENCOUNTER — Telehealth: Payer: Self-pay | Admitting: Physician Assistant

## 2020-07-30 MED ORDER — ALLOPURINOL 300 MG PO TABS: 300.0000 mg | ORAL_TABLET | Freq: Every day | ORAL | 3 refills | Status: DC

## 2020-07-30 NOTE — Telephone Encounter (Signed)
-----   Message from Gaspar Cola, [facility] sent at 07/30/2020  8:20 AM EST ----- Regarding: CCM Pharmacist Recommendations Hello,   After speaking with the patient yesterday, he mentioned he has been having a few gout flares, with the most recent incident being 2 weeks ago. I saw when you spoke with him last his uric acid was slightly elevated. What would you think at this point about having him increase his allopurinol to 300 mg daily?   If you are in agreement, can you please send a new prescription in for him to [facility]?   Thanks, Garey Ham Clinical Pharmacist [facility] [phone]

## 2020-07-30 NOTE — Patient Instructions (Signed)
Visit Information It was great speaking with you today!  Please let me know if you have any questions about our visit.  Goals Addressed            This Visit's Progress   . Monitor and Manage My Blood Sugar-Diabetes Type 2       Timeframe:  Long-Range Goal Priority:  High Start Date:  07/29/2020                           Expected End Date:  01/26/2021                     Follow Up Date 10/28/2020    - check blood sugar at prescribed times - check blood sugar if I feel it is too high or too low    Why is this important?    Checking your blood sugar at home helps to keep it from getting very high or very low.   Writing the results in a diary or log helps the doctor know how to care for you.   Your blood sugar log should have the time, date and the results.   Also, write down the amount of insulin or other medicine that you take.   Other information, like what you ate, exercise done and how you were feeling, will also be helpful.     Notes:        Patient Care Plan: General Pharmacy (Adult)    Problem Identified: Hypertension, Hyperlipidemia, Diabetes, Heart Failure, Coronary Artery Disease, GERD and Gout   Priority: High    Goal: Patient-Specific Goal   Start Date: 07/29/2020  Expected End Date: 01/27/2021  This Visit's Progress: On track  Priority: High  Note:   Current Barriers:  . Unable to independently afford treatment regimen  Pharmacist Clinical Goal(s):  Marland Kitchen Over the next 90 days, patient will verbalize ability to afford treatment regimen through collaboration with PharmD and provider.   Interventions: . 1:1 collaboration with Margaretann Loveless, PA-C regarding development and update of comprehensive plan of care as evidenced by provider attestation and co-signature . Inter-disciplinary care team collaboration (see longitudinal plan of care) . Comprehensive medication review performed; medication list updated in electronic medical record  Hypertension (BP  goal <140/90) -controlled -Current treatment: . Amlodipine 5 mg twice daily  . Carvedilol 25 mg twice daily  . Imdur 60 mg daily  . Losartan 100 mg daily  . Torsemide 20 mg 2 tablets twice daily  -Medications previously tried: NA   -Current home readings: Patient was told by cardiology he does not need to routinely monitor -Current dietary habits: Patient does not follow any specific dietary regimen -Current exercise habits: Patient has unable to get regular exercise due to his back pain, but is planning on restarting PT soon.  -Significant peripheral edema despite diuretic use. Patient tries to keep his legs elevated, and will rarely wear compression stockings.  -Denies hypotensive/hypertensive symptoms -Educated on BP goals and benefits of medications for prevention of heart attack, stroke and kidney damage; Daily salt intake goal < 2300 mg; -Counseled to monitor BP at home if symptomatic, document, and provide log at future appointments -Recommended decreasing amlodipine to 5 mg at bedtime to see if peripheral edema improves   Hyperlipidemia: (LDL goal < 70) -controlled -Current treatment: . Atorvastatin 40 mg daily . Zetia 10 mg daily  -Medications previously tried: NA   -Educated on Importance of limiting foods high  in cholesterol; -Recommended to continue current medication  Diabetes (A1c goal <7%) -controlled -Current medications: Marland Kitchen Metformin 500 mg twice daily  . Trulicity 0.75 mg weekly (used last pen on 07/21/2020)  -Medications previously tried: NA   -Current home glucose readings . fasting glucose: Reported range 130-170 . post prandial glucose: NA -Denies hypoglycemic/hyperglycemic symptoms -Educated onA1c and blood sugar goals; Benefits of routine self-monitoring of blood sugar; -Counseled to check feet daily and get yearly eye exams -Recommended to continue current medication Assessed patient finances. Patient application for Trulicity in progress. Will contact  patient when patient assistance is ready to be mailed.   Heart Failure (Goal: control symptoms and prevent exacerbations) controlled Type: Diastolic -NYHA Class: II (slight limitation of activity) -Ejection fraction: NA (Date: NA) -Current treatment: . Amlodipine 5 mg twice daily  . Carvedilol 25 mg twice daily  . Imdur 60 mg daily  . Losartan 100 mg daily   Torsemide 20 mg 2 tablets twice daily -Medications previously tried: NA -Educated on Importance of weighing daily; if you gain more than 3 pounds in one day or 5 pounds in one week, contact provider's office Proper diuretic administration and potassium supplementation -Recommended decreasing amlodipine to 5 mg daily   Chronic Pain (Goal: Reduce pain and improve quality of life) -uncontrolled -Current treatment  . Cyclobenzaprine 10 mg three times daily as needed - Muscle Spasms (takes most days)  . Indomethacin 50 mg three times daily  . Naproxen 220 mg 2 tablets twice daily as needed . Pregabalin 75 mg two tablets twice daily   Meloxicam 7.5 mg daily  Methylpredisone 4 mg ( for 21 days)  -Medications previously tried: NA  - Patient reports worsening dry mouth in past 1-2 months. Counseled it may be a side effect from increased Pregabalin dose.  -Recently seen at Ortho, prescribed meloxicam + steroid dose pack. Counseled patient on risks of NSAID use and advised to minimize use of indomethacin + naproxen while taking daily meloxicam.  -Recommended to continue current medication  Gout (Goal: Prevent gout flares) -uncontrolled -Current treatment  . Allopurinol 100 mg twice daily  -Last gout flare: 2 weeks ago. Still has flares frequently, but feels they are less severe than prior to allopurinol.  -Medications previously tried: NA  -Recommended increasing allopurinol to 300 mg daily.   GERD (Goal: prevent symptoms of heartburn and reflux ) -controlled -Current treatment  . Pantoprazole 40 mg twice daily  -Medications  previously tried: NA  -A few instances of GERD symptoms since starting Trulicity. Patient reports reflux when lying down after eating dinner. It has improved since he has started propping him self up at night.  -Counseled on diet and exercise extensively  Patient Goals/Self-Care Activities . Over the next 90 days, patient will:  - check glucose daily, document, and provide at future appointments check blood pressure if feeling symptoms of low blood pressure, document, and provide at future appointments  Follow Up Plan: Telephone follow up appointment with care management team member scheduled for: 10/28/20 at 1:00 PM      Mr. Cullom was given information about Chronic Care Management services today including:  1. CCM service includes personalized support from designated clinical staff supervised by his physician, including individualized plan of care and coordination with other care providers 2. 24/7 contact phone numbers for assistance for urgent and routine care needs. 3. Standard insurance, coinsurance, copays and deductibles apply for chronic care management only during months in which we provide at least 20 minutes of these  services. Most insurances cover these services at 100%, however patients may be responsible for any copay, coinsurance and/or deductible if applicable. This service may help you avoid the need for more expensive face-to-face services. 4. Only one practitioner may furnish and bill the service in a calendar month. 5. The patient may stop CCM services at any time (effective at the end of the month) by phone call to the office staff.  Patient agreed to services and verbal consent obtained.   Patient verbalizes understanding of instructions provided today and agrees to view in MyChart.   Garey Ham Clinical Pharmacist [facility] [phone]

## 2020-07-30 NOTE — Telephone Encounter (Signed)
Allopurinol 300mg  sent in for patient to [facility]

## 2020-08-12 ENCOUNTER — Telehealth: Payer: Self-pay

## 2020-08-12 DIAGNOSIS — K2100 Gastro-esophageal reflux disease with esophagitis, without bleeding: Secondary | ICD-10-CM

## 2020-08-12 MED ORDER — PANTOPRAZOLE SODIUM 40 MG PO TBEC: 40.0000 mg | DELAYED_RELEASE_TABLET | Freq: Two times a day (BID) | ORAL | 1 refills | Status: DC

## 2020-08-12 NOTE — Telephone Encounter (Signed)
Patient came in the office and had his prescription bottle for pantoprazole (PROTONIX) 40 MG tablet and states that [facility] is still filling it for once a day and they say that they do not have a prescription for twice a day.  He is asking that we send a new prescription to [facility] in [place].

## 2020-08-12 NOTE — Telephone Encounter (Signed)
Has already been sent this morning

## 2020-08-16 ENCOUNTER — Ambulatory Visit (INDEPENDENT_AMBULATORY_CARE_PROVIDER_SITE_OTHER)

## 2020-08-16 DIAGNOSIS — E1151 Type 2 diabetes mellitus with diabetic peripheral angiopathy without gangrene: Secondary | ICD-10-CM | POA: Diagnosis not present

## 2020-08-16 NOTE — Progress Notes (Signed)
[employer] Patient Assistance form for Trulicity completed by patient and faxed for review on 08/16/20.   Patient also reports his blood sugars have been elevated recently (fasting ranging 143-230). Patient does not qualify for co-pay card due to [employer]. Advised patient to follow strict carbohydrate restriction and maintain regular activity until Trulicity is approved. Patient verbalized his understanding and agreement with the plan.

## 2020-08-19 NOTE — Progress Notes (Signed)
Cardiology Office Note  Date:  08/20/2020   ID:  Jonathon Newman, DOB 01-27-1956, MRN [id number]  PCP:  Margaretann Loveless, PA-C   Chief Complaint  Patient presents with  . 6 month follow up     Patient c/o difficulty swallowing, indigestion, chest pressure, a dull ache in chest and shortness of breath for the past 2 weeks with symptoms becoming more frequently. Medications reviewed by the patient verbally.     HPI:  [name] is a 65 year old gentleman with past medical history of coronary artery disease  chronic diastolic heart failure.   Cypher drug-eluting stent placement to the RCA in 2010.    two-vessel coronary artery bypass graft in 2016 at [facility].   Hyperlipidemia Chronic dizziness date back to 2019 Chronic shortness of breath dating back several years OSA, wears CPAP Who presents for follow-up of his chronic diastolic CHF, CAD  Last clinic visit 01/2020  Trouble swallowing, 3 weeks Severe symptoms past 4 days Normal breakfast today, With water: throat tightens up When he has these episodes, Better with milk, tums,  protonix and baking soda helps a little Has it in the office today, feels tight, feels like something hung up  Had it before: 1 1/2 years ago, lasted 36 hours  Went away on  Its own  Has never seen a gastroenterologist  Chronic SOB, rare chest and jaw pain, Chronic dizziness, dating back several years  stress test was ordered Oct 18, 2019  low risk study no significant ischemia ejection fraction 52%  Still working, in Education officer, environmental, desk job Active in the yard, weekends  On torsemide daily  EKG personally reviewed by myself on todays visit nsr rate 68 bpm no ST changes  Other past medical history reviewed cardiac catheterization November 2019  medical management was recommended  having symptoms of chest discomfort at the time  diagnostic R/LHC on 05/02/2018 that showed significant underlying left main disease with patent grafts  including LIMA to LAD and SVG to OM2. The RCA stent was patent with minimal restenosis. He had normal LVSF with a mildly elevated LVEDP at 14-15 mmHg.   He is wearing his C-PAP.  PMH:   has a past medical history of Arthritis, CHF (congestive heart failure) (HCC), Coronary artery disease, Diabetes mellitus without complication (HCC), Dyspnea, GERD (gastroesophageal reflux disease), Hypercholesteremia, Hypertension, S/P coronary artery bypass graft x 2, and Sleep apnea.  PSH:    Past Surgical History:  Procedure Laterality Date  . APPENDECTOMY    . BACK SURGERY  11/01/2017   SL 5 and S1  . CARDIAC CATHETERIZATION    . CARPAL TUNNEL RELEASE     left hand  . CHOLECYSTECTOMY    . CHOLECYSTECTOMY    . CORONARY ARTERY BYPASS GRAFT  04/21/2015   CABG x 2 [employer] [facility] LIMA to LAD amd SVG to OM2  . CORONARY STENT PLACEMENT  2010   Cordis Cypher Sirolimus-eluting stent 2.50 mm x 26 mm placed to the RCA at [facility]   . ESOPHAGEAL MANOMETRY N/A 08/04/2017   Procedure: ESOPHAGEAL MANOMETRY (EM);  Surgeon: Toney Reil, MD;  Location: [facility] ENDOSCOPY;  Service: Endoscopy;  Laterality: N/A;  . FRACTURE SURGERY    . KNEE SURGERY    . KNEE SURGERY     right knee   . LAMINECTOMY     "plate in neck V6-H2"  . LEFT HEART CATH AND CORS/GRAFTS ANGIOGRAPHY N/A 05/02/2018   Procedure: CORS/GRAFTS ANGIOGRAPHY;  Surgeon: Iran Ouch, MD;  Location: [facility] INVASIVE CV LAB;  Service: Cardiovascular;  Laterality: N/A;  . RIGHT/LEFT HEART CATH AND CORONARY ANGIOGRAPHY Bilateral 05/02/2018   Procedure: LEFT HEART CATH;  Surgeon: Iran OuchArida, Muhammad A, MD;  Location: [facility] INVASIVE CV LAB;  Service: Cardiovascular;  Laterality: Bilateral;  . VASECTOMY      Current Outpatient Medications  Medication Sig Dispense Refill  . allopurinol (ZYLOPRIM) 300 MG tablet Take 1 tablet (300 mg total) by mouth daily. 90 tablet 3  . amLODipine (NORVASC) 5 MG tablet Take 1 tablet (5 mg total) by mouth  2 (two) times daily. 180 tablet 3  . aspirin 81 MG tablet Take 81 mg by mouth daily.    Marland Kitchen. atorvastatin (LIPITOR) 40 MG tablet Take 1 tablet (40 mg total) by mouth daily. 90 tablet 3  . Blood Glucose Monitoring Suppl (FREESTYLE LITE) DEVI To check blood sugar once daily 1 each 0  . carvedilol (COREG) 25 MG tablet Take 1 tablet (25 mg total) by mouth 2 (two) times daily with a meal. 180 tablet 3  . colchicine 0.6 MG tablet Take 0.6 mg by mouth 2 (two) times daily.    . cyclobenzaprine (FLEXERIL) 10 MG tablet TAKE ONE TABLET BY MOUTH THREE TIMES A DAY AS NEEDED FOR MUSCLE SPASMS 90 tablet 5  . ezetimibe (ZETIA) 10 MG tablet Take 1 tablet (10 mg total) by mouth daily. 90 tablet 3  . glucose blood (FREESTYLE LITE) test strip To check blood sugar once daily 100 each 12  . indomethacin (INDOCIN) 50 MG capsule TAKE ONE CAPSULE BY MOUTH THREE TIMES A DAY WITH MEALS 270 capsule 2  . isosorbide mononitrate (IMDUR) 60 MG 24 hr tablet Take 1 tablet (60 mg total) by mouth daily. 90 tablet 3  . Lancets (FREESTYLE) lancets To check blood sugar once daily 100 each 12  . losartan (COZAAR) 100 MG tablet Take 1 tablet (100 mg total) by mouth daily. 90 tablet 3  . meloxicam (MOBIC) 7.5 MG tablet Take 7.5 mg by mouth daily.    . metFORMIN (GLUCOPHAGE) 500 MG tablet Take 1 tablet (500 mg total) by mouth 2 (two) times daily with a meal. 180 tablet 1  . methylPREDNISolone (MEDROL) 4 MG tablet Take dose pack as instructed daily for 21 days    . Multiple Vitamin (MULTIVITAMIN WITH MINERALS) TABS tablet Take 1 tablet by mouth daily.    . naproxen sodium (ALEVE) 220 MG tablet Take 440 mg by mouth 2 (two) times daily as needed (mild pain).    . nitroGLYCERIN (NITROSTAT) 0.4 MG SL tablet Place 1 tablet (0.4 mg total) under the tongue every 5 (five) minutes as needed for chest pain. 25 tablet 3  . pantoprazole (PROTONIX) 40 MG tablet Take 1 tablet (40 mg total) by mouth 2 (two) times daily before a meal. 180 tablet 1  .  potassium chloride (KLOR-CON) 10 MEQ tablet TAKE ONE TABLET BY MOUTH TWICE A DAY AS NEEDED 60 tablet 0  . pregabalin (LYRICA) 75 MG capsule Take one capsule (75mg ) BID x 1-2 weeks and then increase to 2 capsules (150mg ) BID 120 capsule 1  . senna (SENOKOT) 8.6 MG TABS tablet Take 1 tablet by mouth daily.    Marland Kitchen. torsemide (DEMADEX) 20 MG tablet TAKE TWO TABLETS BY MOUTH TWICE A DAY 120 tablet 0  . Dulaglutide (TRULICITY) 0.75 MG/0.5ML SOPN Inject 0.75 mg into the skin once a week. (Patient not taking: Reported on 08/20/2020) 3 mL 4   No current facility-administered medications for this visit.  Allergies:   Ace inhibitors, Colchicine, and Lisinopril   Social History:  The patient  reports that he quit smoking about 15 years ago. His smoking use included cigarettes. He has a 0.50 pack-year smoking history. He has quit using smokeless tobacco.  His smokeless tobacco use included chew. He reports that he does not drink alcohol and does not use drugs.   Family History:   family history includes Arthritis in his mother; Cancer in his maternal grandfather; Cataracts in his maternal grandmother; Glaucoma in his maternal grandmother; Heart attack in his paternal grandfather; Heart attack (age of onset: 51) in his father; Heart murmur in his brother; Hyperlipidemia in his father; Hypertension in his father and paternal grandfather; Valvular heart disease in his brother.    Review of Systems: Review of Systems  Constitutional: Negative.   HENT: Negative.   Respiratory: Negative.   Cardiovascular: Negative.   Gastrointestinal: Positive for heartburn.  Musculoskeletal: Negative.   Neurological: Positive for dizziness.  Psychiatric/Behavioral: Negative.   All other systems reviewed and are negative.    PHYSICAL EXAM: VS:  BP 120/70 (BP Location: Left Arm, Patient Position: Sitting, Cuff Size: Normal)   Pulse 68   Ht 5\' 7"  (1.702 m)   Wt 244 lb 6 oz (110.8 kg)   SpO2 98%   BMI 38.27 kg/m  , BMI  Body mass index is 38.27 kg/m. Constitutional:  oriented to person, place, and time. No distress.  HENT:  Head: Grossly normal Eyes:  no discharge. No scleral icterus.  Neck: No JVD, no carotid bruits  Cardiovascular: Regular rate and rhythm, no murmurs appreciated Pulmonary/Chest: Clear to auscultation bilaterally, no wheezes or rails Abdominal: Soft.  no distension.  no tenderness.  Musculoskeletal: Normal range of motion Neurological:  normal muscle tone. Coordination normal. No atrophy Skin: Skin warm and dry Psychiatric: normal affect, pleasant   Recent Labs: 06/28/2020: ALT 92; BUN 23; Creatinine, Ser 1.22; Potassium 4.2; Sodium 138    Lipid Panel Lab Results  Component Value Date   CHOL 209 (H) 01/22/2020   HDL 36 (L) 01/22/2020   LDLCALC 93 01/22/2020   TRIG 481 (H) 01/22/2020      Wt Readings from Last 3 Encounters:  08/20/20 244 lb 6 oz (110.8 kg)  06/28/20 241 lb 6.4 oz (109.5 kg)  05/29/20 234 lb 12.8 oz (106.5 kg)      ASSESSMENT AND PLAN:  Acute on chronic diastolic congestive heart failure (HCC) -  Euvolemic, no changes to his medications  GERD Referral made to GI Recommend taking Pepcid, Carafate Continue on PPI Avoid spicy foods, avoid NSAIDs We will check a troponin EKG unchanged, very atypical in nature for cardiac  Coronary artery disease involving native coronary artery of native heart with angina pectoris (HCC) Chronic angina, Has had prior catheterization, stress test low risk Long history shortness of breath chest pain No further work-up at this time Symptoms above sounding more GI related  S/P coronary artery bypass graft x 2 - Plan: EKG 12-Lead Stress test as above, low risk And has had cardiac catheterization  Hyperlipidemia, unspecified hyperlipidemia type Continue Lipitor 40 daily,  goal LDL less than 70 No changes  Essential hypertension Blood pressure is well controlled on today's visit. No changes made to the  medications.  Chronic dizziness Prior ENT work-up in the past  Prior MRI head 2017 no acute findings   Total encounter time more than 35 minutes  Greater than 50% was spent in counseling and coordination of care with the  patient     No orders of the defined types were placed in this encounter.    Signed, Dossie Arbour, M.D., Ph.D. 08/20/2020  [facility] [place], [place] [phone]

## 2020-08-20 ENCOUNTER — Other Ambulatory Visit
Admission: RE | Admit: 2020-08-20 | Discharge: 2020-08-20 | Disposition: A | Discharge status: Home / Self Care | Source: Ambulatory Visit | Attending: Cardiovascular Disease | Admitting: Cardiovascular Disease

## 2020-08-20 ENCOUNTER — Encounter: Payer: Self-pay | Admitting: Cardiovascular Disease

## 2020-08-20 ENCOUNTER — Other Ambulatory Visit: Payer: Self-pay

## 2020-08-20 ENCOUNTER — Ambulatory Visit (INDEPENDENT_AMBULATORY_CARE_PROVIDER_SITE_OTHER): Admitting: Cardiovascular Disease

## 2020-08-20 VITALS — BP 120/70 | HR 68 | Ht 67.0 in | Wt 244.4 lb

## 2020-08-20 DIAGNOSIS — E785 Hyperlipidemia, unspecified: Secondary | ICD-10-CM

## 2020-08-20 DIAGNOSIS — I5032 Chronic diastolic (congestive) heart failure: Secondary | ICD-10-CM | POA: Diagnosis not present

## 2020-08-20 DIAGNOSIS — I25118 Atherosclerotic heart disease of native coronary artery with other forms of angina pectoris: Secondary | ICD-10-CM

## 2020-08-20 DIAGNOSIS — I10 Essential (primary) hypertension: Secondary | ICD-10-CM | POA: Diagnosis not present

## 2020-08-20 DIAGNOSIS — R079 Chest pain, unspecified: Secondary | ICD-10-CM | POA: Insufficient documentation

## 2020-08-20 DIAGNOSIS — R1310 Dysphagia, unspecified: Secondary | ICD-10-CM

## 2020-08-20 DIAGNOSIS — K219 Gastro-esophageal reflux disease without esophagitis: Secondary | ICD-10-CM

## 2020-08-20 LAB — TROPONIN I (HIGH SENSITIVITY): Troponin I (High Sensitivity): 12 ng/L (ref <18)

## 2020-08-20 MED ORDER — SUCRALFATE 1 GM/10ML PO SUSP: 1.0000 g | Freq: Three times a day (TID) | ORAL | 1 refills | Status: DC

## 2020-08-20 NOTE — Patient Instructions (Addendum)
Referral to Dr. Servando Snare has been placed, [facility] provider for severe GERD symptoms, pain to swallow   Medication Instructions: Please start pepcid/famotidine 1- 2 pills twice a day carafate suspension up to four times a day  Lab work: Troponin (cardiac marker)  Will will call with abnormal resutls  Testing/Procedures: No new testing needed   Follow-Up:  . You will need a follow up appointment in 3 months  . Providers on your designated Care Team:   . Nicolasa Ducking, NP . Eula Listen, PA-C . Marisue Ivan, PA-C

## 2020-08-24 ENCOUNTER — Other Ambulatory Visit: Payer: Self-pay | Admitting: Physician Assistant

## 2020-08-24 ENCOUNTER — Other Ambulatory Visit: Payer: Self-pay | Admitting: Cardiovascular Disease

## 2020-08-24 DIAGNOSIS — M47817 Spondylosis without myelopathy or radiculopathy, lumbosacral region: Secondary | ICD-10-CM

## 2020-08-24 DIAGNOSIS — M5416 Radiculopathy, lumbar region: Secondary | ICD-10-CM

## 2020-08-24 DIAGNOSIS — M5137 Other intervertebral disc degeneration, lumbosacral region: Secondary | ICD-10-CM

## 2020-08-24 NOTE — Telephone Encounter (Signed)
Requested medications are due for refill today yes  Requested medications are on the active medication list yes  Last refill 2/13  Last visit Jan  Future visit scheduled April  Notes to clinic Not Delegated

## 2020-08-26 ENCOUNTER — Other Ambulatory Visit: Payer: Self-pay | Admitting: Physician Assistant

## 2020-08-26 NOTE — Telephone Encounter (Signed)
Is he taking the 2 tabs twice daily (150mg ) or only the one tab twice daily (75mg )? He was to increase. If he has I will send in the 150mg  tab so he isnt taking as many pills

## 2020-08-26 NOTE — Telephone Encounter (Signed)
LOV 06/28/2020  LRF 06/28/20  #120

## 2020-08-27 ENCOUNTER — Telehealth: Payer: Self-pay

## 2020-08-27 NOTE — Progress Notes (Signed)
Chronic Care Management Pharmacy Assistant   Name: Jonathon Newman  MRN: [id number] DOB: Nov 06, 1955  Reason for Encounter: Medication Review /Update:Patient assistance application for Trulicity    Medications: Outpatient Encounter Medications as of 08/27/2020  Medication Sig   allopurinol (ZYLOPRIM) 300 MG tablet Take 1 tablet (300 mg total) by mouth daily.   amLODipine (NORVASC) 5 MG tablet Take 1 tablet (5 mg total) by mouth 2 (two) times daily.   aspirin 81 MG tablet Take 81 mg by mouth daily.   atorvastatin (LIPITOR) 40 MG tablet Take 1 tablet (40 mg total) by mouth daily.   Blood Glucose Monitoring Suppl (FREESTYLE LITE) DEVI To check blood sugar once daily   carvedilol (COREG) 25 MG tablet Take 1 tablet (25 mg total) by mouth 2 (two) times daily with a meal.   colchicine 0.6 MG tablet Take 0.6 mg by mouth 2 (two) times daily.   cyclobenzaprine (FLEXERIL) 10 MG tablet TAKE ONE TABLET BY MOUTH THREE TIMES A DAY AS NEEDED FOR MUSCLE SPASMS   Dulaglutide (TRULICITY) 0.75 MG/0.5ML SOPN Inject 0.75 mg into the skin once a week. (Patient not taking: Reported on 08/20/2020)   ezetimibe (ZETIA) 10 MG tablet Take 1 tablet (10 mg total) by mouth daily.   glucose blood (FREESTYLE LITE) test strip USE TO CHECK BLOOD SUGAR ONCE DAILY   indomethacin (INDOCIN) 50 MG capsule TAKE ONE CAPSULE BY MOUTH THREE TIMES A DAY WITH MEALS   isosorbide mononitrate (IMDUR) 60 MG 24 hr tablet Take 1 tablet (60 mg total) by mouth daily.   Lancets (FREESTYLE) lancets USE TO CHECK BLOOD SUGAR ONCE DAILY   losartan (COZAAR) 100 MG tablet Take 1 tablet (100 mg total) by mouth daily.   meloxicam (MOBIC) 7.5 MG tablet Take 7.5 mg by mouth daily.   metFORMIN (GLUCOPHAGE) 500 MG tablet Take 1 tablet (500 mg total) by mouth 2 (two) times daily with a meal.   methylPREDNISolone (MEDROL) 4 MG tablet Take dose pack as instructed daily for 21 days   Multiple Vitamin (MULTIVITAMIN WITH MINERALS) TABS tablet  Take 1 tablet by mouth daily.   naproxen sodium (ALEVE) 220 MG tablet Take 440 mg by mouth 2 (two) times daily as needed (mild pain).   nitroGLYCERIN (NITROSTAT) 0.4 MG SL tablet Place 1 tablet (0.4 mg total) under the tongue every 5 (five) minutes as needed for chest pain.   pantoprazole (PROTONIX) 40 MG tablet Take 1 tablet (40 mg total) by mouth 2 (two) times daily before a meal.   potassium chloride (KLOR-CON) 10 MEQ tablet TAKE ONE TABLET BY MOUTH TWICE A DAY AS NEEDED   pregabalin (LYRICA) 75 MG capsule Take one capsule (75mg ) BID x 1-2 weeks and then increase to 2 capsules (150mg ) BID   senna (SENOKOT) 8.6 MG TABS tablet Take 1 tablet by mouth daily.   sucralfate (CARAFATE) 1 GM/10ML suspension Take 10 mLs (1 g total) by mouth 4 (four) times daily -  with meals and at bedtime.   torsemide (DEMADEX) 20 MG tablet TAKE TWO TABLETS BY MOUTH TWICE A DAY   No facility-administered encounter medications on file as of 08/27/2020.    Spoke with for a a update on Trulicity application. Per [facility], patient is missing information of his insurance. Patient needs to fax a copy of his insurance front and back with his first and last name with his  date of birth on it to be able to process the application. Notified Clinical Pharmacist.  [place] Pharmacist Assistant [phone]

## 2020-08-27 NOTE — Telephone Encounter (Signed)
Patient called, left VM to return the call to the office to discuss medications. ?

## 2020-08-28 ENCOUNTER — Other Ambulatory Visit: Payer: Self-pay

## 2020-08-28 DIAGNOSIS — M5137 Other intervertebral disc degeneration, lumbosacral region: Secondary | ICD-10-CM

## 2020-08-28 DIAGNOSIS — M5416 Radiculopathy, lumbar region: Secondary | ICD-10-CM

## 2020-08-28 DIAGNOSIS — M47817 Spondylosis without myelopathy or radiculopathy, lumbosacral region: Secondary | ICD-10-CM

## 2020-08-28 MED ORDER — PREGABALIN 150 MG PO CAPS: 150.0000 mg | ORAL_CAPSULE | Freq: Two times a day (BID) | ORAL | 1 refills | Status: DC

## 2020-08-28 NOTE — Telephone Encounter (Signed)
Attempted x 2 by PEC NT to contact patient, routing to the office for resolution.

## 2020-08-28 NOTE — Telephone Encounter (Signed)
Left a vm for patient to callback 

## 2020-08-28 NOTE — Telephone Encounter (Signed)
Copied from CRM [id number]. Topic: General - Other >> Aug 27, 2020  5:49 PM Pawlus, Maxine Glenn A wrote: Reason for CRM: Pt stated he was calling back to let Victorino Dike know he takes pregabapentin capsules, twice daily.

## 2020-08-28 NOTE — Telephone Encounter (Signed)
I have made a copy of this pt's insurance card front and back but this was already in the pt's chart under the media tab and you could have just printed that off instead of the pt making a special trip to the office for [place] to make a copy.

## 2020-08-28 NOTE — Telephone Encounter (Signed)
Pt called back and given the information from [facility] the pharmacist assistance. Pt is going to come by the office, get them to make a copy and tell him where to fax this information, because it does not say.

## 2020-08-30 ENCOUNTER — Telehealth: Payer: Self-pay | Admitting: Physician Assistant

## 2020-08-30 NOTE — Telephone Encounter (Signed)
Pt called stating that he contacted [facility] regarding the medication trulicity. He states that they are able to mail it to him at a price he is willing to pay, but that he needs to have a PA sent to [employer] to have this approved. Pt is requesting to have a call back. Please advise.         [facility] - [place], [place] - [address]  [address] [place] [address]  Phone: [phone] Fax: [phone]  Hours: Not open 24 hours

## 2020-08-30 NOTE — Progress Notes (Signed)
08/30/2020- Called patient to follow up on Trulicity application with [employer], due to patient having [employer], the patient assistance program will not be able to accept application, ineligible for any assistance. Patient aware, PharmD inquired if he has tried using the [place] for medications and/or [facility] mail order for medications. Patient states he is unable to use the [place] due to the type of insurance coverage he has received. [facility] has [facility] and is willing to use this option to see how much they will cover on his Trulicity. Patient also wanted to know what other options of medications would he be able to use if he is unable to afford the Trulicity. Patient reports his fasting blood sugars are from 264 to 280 everyday. He has not checked his non fasting blood sugars recently but before after he takes his medications his blood sugars would range from 146-160. Patient aware I will relay this information to Angelena Sole, CPP to request refill to be [facility] to [facility] and to advise on other medication options.  Billee Cashing, CMA Clinical Pharmacist Assistant [phone] CCM time added 16 mins

## 2020-09-06 ENCOUNTER — Other Ambulatory Visit: Payer: Self-pay | Admitting: Cardiovascular Disease

## 2020-09-11 ENCOUNTER — Telehealth: Payer: Self-pay

## 2020-09-11 NOTE — Progress Notes (Signed)
    Chronic Care Management Pharmacy Assistant   Name: Couper Juncaj  MRN: [id number] DOB: [birth year]   Reason for Encounter: Medication Review   Medications: Outpatient Encounter Medications as of 09/11/2020  Medication Sig  . allopurinol (ZYLOPRIM) 300 MG tablet Take 1 tablet (300 mg total) by mouth daily.  Marland Kitchen amLODipine (NORVASC) 5 MG tablet Take 1 tablet (5 mg total) by mouth 2 (two) times daily.  Marland Kitchen aspirin 81 MG tablet Take 81 mg by mouth daily.  Marland Kitchen atorvastatin (LIPITOR) 40 MG tablet Take 1 tablet (40 mg total) by mouth daily.  . Blood Glucose Monitoring Suppl (FREESTYLE LITE) DEVI To check blood sugar once daily  . carvedilol (COREG) 25 MG tablet Take 1 tablet (25 mg total) by mouth 2 (two) times daily with a meal.  . colchicine 0.6 MG tablet Take 0.6 mg by mouth 2 (two) times daily.  . cyclobenzaprine (FLEXERIL) 10 MG tablet TAKE ONE TABLET BY MOUTH THREE TIMES A DAY AS NEEDED FOR MUSCLE SPASMS  . Dulaglutide (TRULICITY) 0.75 MG/0.5ML SOPN Inject 0.75 mg into the skin once a week. (Patient not taking: Reported on 08/20/2020)  . ezetimibe (ZETIA) 10 MG tablet Take 1 tablet (10 mg total) by mouth daily.  Marland Kitchen glucose blood (FREESTYLE LITE) test strip USE TO CHECK BLOOD SUGAR ONCE DAILY  . indomethacin (INDOCIN) 50 MG capsule TAKE ONE CAPSULE BY MOUTH THREE TIMES A DAY WITH MEALS  . isosorbide mononitrate (IMDUR) 60 MG 24 hr tablet Take 1 tablet (60 mg total) by mouth daily.  . Lancets (FREESTYLE) lancets USE TO CHECK BLOOD SUGAR ONCE DAILY  . losartan (COZAAR) 100 MG tablet Take 1 tablet (100 mg total) by mouth daily.  . meloxicam (MOBIC) 7.5 MG tablet Take 7.5 mg by mouth daily.  . metFORMIN (GLUCOPHAGE) 500 MG tablet Take 1 tablet (500 mg total) by mouth 2 (two) times daily with a meal.  . methylPREDNISolone (MEDROL) 4 MG tablet Take dose pack as instructed daily for 21 days  . Multiple Vitamin (MULTIVITAMIN WITH MINERALS) TABS tablet Take 1 tablet by mouth daily.  . naproxen sodium  (ALEVE) 220 MG tablet Take 440 mg by mouth 2 (two) times daily as needed (mild pain).  . nitroGLYCERIN (NITROSTAT) 0.4 MG SL tablet Place 1 tablet (0.4 mg total) under the tongue every 5 (five) minutes as needed for chest pain.  . pantoprazole (PROTONIX) 40 MG tablet Take 1 tablet (40 mg total) by mouth 2 (two) times daily before a meal.  . potassium chloride (KLOR-CON) 10 MEQ tablet TAKE ONE TABLET BY MOUTH TWICE A DAY AS NEEDED  . pregabalin (LYRICA) 150 MG capsule Take 1 capsule (150 mg total) by mouth 2 (two) times daily.  Marland Kitchen senna (SENOKOT) 8.6 MG TABS tablet Take 1 tablet by mouth daily.  . sucralfate (CARAFATE) 1 GM/10ML suspension Take 10 mLs (1 g total) by mouth 4 (four) times daily -  with meals and at bedtime.  . torsemide (DEMADEX) 20 MG tablet TAKE TWO TABLETS BY MOUTH TWICE A DAY   No facility-administered encounter medications on file as of 09/11/2020.   Reviewed chart and adherence measures. Per insurance data patient is up to date on his care gap.  Everlean Cherry Clinical Pharmacist Assistant [phone]

## 2020-09-23 ENCOUNTER — Telehealth: Payer: Self-pay | Admitting: Physician Assistant

## 2020-09-23 ENCOUNTER — Other Ambulatory Visit: Payer: Self-pay | Admitting: Physician Assistant

## 2020-09-23 NOTE — Telephone Encounter (Signed)
pregabalin (LYRICA) 150 MG capsule [[id number]]    Order Details Dose: 150 mg Route: Oral Frequency: 2 times daily  Dispense Quantity: 180 capsule Refills: 1   Note to Pharmacy: Note dose change       Sig: Take 1 capsule (150 mg total) by mouth 2 (two) times daily.       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, [place] [address]  Phone:  [phone]   Fax:  [phone]  DEA #:  [id number]   NPI:  [id number] Supervising Provider:   Erasmo Downer, MD  [address], [place] [address]  Phone:  [phone]   Fax:  [phone]  DEA #:  [id number]   NPI:  [id number]     Ordering User:  Margaretann Loveless, PA-C         Pharmacy     See dose above please clarify what he is taking this dose is different, I will send in enough to get to his follow up once clarified.

## 2020-09-23 NOTE — Telephone Encounter (Signed)
Requested medication (s) are due for refill today: no  Requested medication (s) are on the active medication list: yes  Last refill:  08/28/2020  Future visit scheduled:no  Notes to clinic:  this refill cannot be delegated    Requested Prescriptions  Pending Prescriptions Disp Refills   pregabalin (LYRICA) 75 MG capsule [Pharmacy Med Name: PREGABALIN 75 MG CAPSULE] 120 capsule     Sig: TAKE ONE CAPSULE BY MOUTH TWICE A DAY FOR 1-2 WEEKS AND THEN INCREASE TO TAKE TWO CAPSULES BY MOUTH TWICE A DAY      Not Delegated - Neurology:  Anticonvulsants - Controlled Failed - 09/23/2020  6:21 AM      Failed - This refill cannot be delegated      Passed - Valid encounter within last 12 months    Recent Outpatient Visits           2 months ago DDD (degenerative disc disease), lumbosacral   [facility] [place], [place], PA-C   3 months ago New onset type 2 diabetes mellitus [facility])   [facility] [place], [place], PA-C   5 months ago Type 2 diabetes mellitus without complication, with long-term current use of insulin [facility])   [facility] [place], [place], [place]   1 year ago New onset type 2 diabetes mellitus [facility])   [facility] [place], [place], [place]   1 year ago Acute idiopathic gout of left ankle   [facility] [place], Alessandra [place], [place]       Future Appointments             In 4 days Flinchum, Eula Fried, FNP [facility], [facility]   In 2 months Gollan, Tollie Pizza, MD [facility], [facility]

## 2020-09-23 NOTE — Telephone Encounter (Signed)
Pre-op request for Lumbar steroid injection, wanting to hold ASA 6 days prior to surgery and resume 24 hours post-surgery.   H/o CAD with stenting in 2019 and 2V CABG in 2016 at the [place], HFpEF, HLD, OSA on CPAP. Last cath 2019, significant underlying left main disease with patent grafts including LIMA to LAD and SVG to OM2. The RCA stent was patent with minimal restenosis. He had normal LVSF with a mildly elevated LVEDP at 14-15 mmHg- recommended medical management. Stress test in May 2021 was low risk, EF 52%, no significant ischemia.   Patient was last seen 08/20/20 and had atypical chest pain, suspected GERD and referred to GI.   Please comment on Aspirin and route answer to P CV DIV PREOP  Thanks

## 2020-09-23 NOTE — Telephone Encounter (Signed)
   [facility] Pre-operative Risk Assessment    Patient Name: Jonathon Newman  DOB: Mar 26, 1956  MRN: [id number]   HEARTCARE STAFF: - Please ensure there is not already an duplicate clearance open for this procedure. - Under Visit Info/Reason for Call, type in Other and utilize the format Clearance MM/DD/YY or Clearance TBD. Do not use dashes or single digits. - If request is for dental extraction, please clarify the # of teeth to be extracted.  Request for surgical clearance:  1. What type of surgery is being performed? Lumbar Epidral Steroid Injection   When is this surgery scheduled? TBD  What type of clearance is required (medical clearance vs. Pharmacy clearance to hold med vs. Both)? Both  2. Are there any medications that need to be held prior to surgery and how long? Aspirin 81 mg 6 days prior to surgery, resume 24 hours aftewards  3. Practice name and name of physician performing surgery? [facility]   4. What is the office phone number? [phone]   7.   What is the office fax number? [phone]  8.   Anesthesia type (None, local, MAC, general) ? Not listed   Elissa Hefty 09/23/2020, 10:56 AM  _________________________________________________________________   (provider comments below)

## 2020-09-24 NOTE — Telephone Encounter (Signed)
Acceptable risk for procedure I would prefer he stay on asa 81 mg daily (perhaps ok to hold day of procedure) Given CABG and prior PCI/stent

## 2020-09-25 NOTE — Telephone Encounter (Signed)
Called the pt to go over medication recommendations for upcoming procedure. Left message to call back to go over recommendations.

## 2020-09-25 NOTE — Telephone Encounter (Signed)
Pt has been notified and made aware of clearance for ESI and medication recommendations per Dr. Mariah Milling and pre op provider. Clearance request asked to hold ASA x 6 days prior and hold for 24 hour post procedure. Per Dr. Mariah Milling pt recent PCI; gave recommendations for the pt to remain on the ASA and may hold ASA the morning of the procedure, given due to recent PCI. Pt is agreeable to plan of care. I assured the pt that notes have been sent to requesting office. Pt thanked me for the call and the help. Pt gave vernal understanding to medications instructions per Dr. Mariah Milling.

## 2020-09-25 NOTE — Telephone Encounter (Signed)
   Name: Jonathon Newman  DOB: Mar 01, 1956  MRN: [id number]   Primary Cardiologist: Julien Nordmann, MD  Chart reviewed as part of pre-operative protocol coverage.   Per Dr. Mariah Milling: Acceptable risk for procedure I would prefer he stay on asa 81 mg daily (perhaps ok to hold day of procedure) given CABG and prior PCI/stent.  I will route this recommendation to the requesting party via Epic fax function and remove from pre-op pool. Please call with questions.  Roe Rutherford Jamarius Saha, PA 09/25/2020, 8:22 AM

## 2020-09-27 ENCOUNTER — Other Ambulatory Visit: Payer: Self-pay

## 2020-09-27 ENCOUNTER — Encounter: Payer: Self-pay | Admitting: Adult Health

## 2020-09-27 ENCOUNTER — Ambulatory Visit: Admitting: Physician Assistant

## 2020-09-27 ENCOUNTER — Ambulatory Visit (INDEPENDENT_AMBULATORY_CARE_PROVIDER_SITE_OTHER): Admitting: Adult Health

## 2020-09-27 VITALS — BP 157/83 | HR 71 | Resp 16 | Wt 248.4 lb

## 2020-09-27 DIAGNOSIS — E1151 Type 2 diabetes mellitus with diabetic peripheral angiopathy without gangrene: Secondary | ICD-10-CM

## 2020-09-27 DIAGNOSIS — E119 Type 2 diabetes mellitus without complications: Secondary | ICD-10-CM

## 2020-09-27 DIAGNOSIS — M5137 Other intervertebral disc degeneration, lumbosacral region: Secondary | ICD-10-CM | POA: Diagnosis not present

## 2020-09-27 DIAGNOSIS — M5416 Radiculopathy, lumbar region: Secondary | ICD-10-CM | POA: Diagnosis not present

## 2020-09-27 DIAGNOSIS — R972 Elevated prostate specific antigen [PSA]: Secondary | ICD-10-CM

## 2020-09-27 DIAGNOSIS — R7401 Elevation of levels of liver transaminase levels: Secondary | ICD-10-CM

## 2020-09-27 LAB — POCT GLYCOSYLATED HEMOGLOBIN (HGB A1C): Hemoglobin A1C: 7.7 % — AB (ref 4.0–5.6)

## 2020-09-27 MED ORDER — CYCLOBENZAPRINE HCL 10 MG PO TABS
ORAL_TABLET | ORAL | 0 refills | Status: DC
Start: 2020-09-27 — End: 2021-02-07

## 2020-09-27 MED ORDER — TRULICITY 0.75 MG/0.5ML ~~LOC~~ SOAJ: 0.7500 mg | SUBCUTANEOUS | 4 refills | Status: DC

## 2020-09-27 NOTE — Progress Notes (Signed)
Established patient visit   Patient: Jonathon Newman   DOB: 05-17-1956   65 y.o. Male  MRN: [id number] Visit Date: 09/27/2020  Today's healthcare provider: Marcille Buffy, FNP   Chief Complaint  Patient presents with  . Diabetes  . Hypertension   Subjective    HPI  Diabetes Mellitus Type II, Follow-up  Lab Results  Component Value Date   HGBA1C 9.0 (H) 09/27/2020   HGBA1C 7.7 (A) 09/27/2020   HGBA1C 7.0 (H) 06/28/2020   Wt Readings from Last 3 Encounters:  09/27/20 248 lb 6.4 oz (112.7 kg)  08/20/20 244 lb 6 oz (110.8 kg)  06/28/20 241 lb 6.4 oz (109.5 kg)   Last seen for diabetes 3 months ago.  Management since then includes none. He reports excellent compliance with treatment. He is not having side effects.  Symptoms: Yes fatigue No foot ulcerations  No appetite changes No nausea  Yes paresthesia of the feet  Yes polydipsia  Yes polyuria No visual disturbances   No vomiting     Home blood sugar records: 120-286  Episodes of hypoglycemia? No    Current insulin regiment: trulicity 0.94BS SQ weekly ( patient is currently not taking due to cost of prescription).  Most Recent Eye Exam: 1 year Current exercise: none Current diet habits: well balanced  Pertinent Labs: Lab Results  Component Value Date   CHOL 209 (H) 01/22/2020   HDL 36 (L) 01/22/2020   LDLCALC 93 01/22/2020   TRIG 481 (H) 01/22/2020   CHOLHDL 5.8 (H) 01/22/2020   Lab Results  Component Value Date   NA 135 09/27/2020   K 4.7 09/27/2020   CREATININE 1.16 09/27/2020   GFRNONAA 63 06/28/2020   GFRAA 72 06/28/2020   GLUCOSE 234 (H) 09/27/2020     --------------------------------------------------------------------------------------------------- Hypertension, follow-up  BP Readings from Last 3 Encounters:  09/27/20 (!) 157/83  08/20/20 120/70  06/28/20 137/73   Wt Readings from Last 3 Encounters:  09/27/20 248 lb 6.4 oz (112.7 kg)  08/20/20 244 lb 6 oz (110.8 kg)   06/28/20 241 lb 6.4 oz (109.5 kg)     He was last seen for hypertension 3 months ago.  BP at that visit was 137/73. Management since that visit includes none. Still seeing Dr. Candis Musa. Cardiology  denies any changes since seeing DR. Candis Musa, He reports excellent compliance with treatment. He is not having side effects.  He is following a Regular diet. He is not exercising. He does not smoke.  Use of agents associated with hypertension: none.   Outside blood pressures are not being checked. Symptoms: No chest pain No chest pressure  No palpitations No syncope  Yes dyspnea No orthopnea  No paroxysmal nocturnal dyspnea Yes lower extremity edema   Pertinent labs: Lab Results  Component Value Date   CHOL 209 (H) 01/22/2020   HDL 36 (L) 01/22/2020   LDLCALC 93 01/22/2020   TRIG 481 (H) 01/22/2020   CHOLHDL 5.8 (H) 01/22/2020   Lab Results  Component Value Date   NA 135 09/27/2020   K 4.7 09/27/2020   CREATININE 1.16 09/27/2020   GFRNONAA 63 06/28/2020   GFRAA 72 06/28/2020   GLUCOSE 234 (H) 09/27/2020     The 10-year ASCVD risk score Mikey Bussing DC Jr., et al., 2013) is: 53.2%   --------------------------------------------------------------------------------------------------- Follow up for DDD  The patient was last seen for this 3 months ago. Changes made at last visit include patient states that he has a upcoming appt with [facility], we increased to Lyrica from 75 to 177m .  He reports excellent compliance with treatment. He feels that condition is Unchanged. He is not having side effects. He did not pick up Flexeril seeing orthopedics in the near future for injection. Using cane. Denies any falls.  -----------------------------------------------------------------------------------------   Patient Active Problem List   Diagnosis Date Noted  . Morbid obesity ([place] 05/20/2020  . T2DM (type 2 diabetes mellitus) (HC[place] 07/17/2019  . Benign paroxysmal positional vertigo  due to bilateral vestibular disorder 03/15/2019  . Abnormal findings on diagnostic imaging of lung 03/15/2019  . Chest pain 03/10/2019  . Unstable angina ([place] 04/25/2018  . SOB (shortness of breath) 04/13/2018  . Dizziness 04/13/2018  . S/P coronary artery bypass graft x 2   . Incomplete bladder emptying 07/21/2017  . Low back pain 07/09/2017  . Lumbar radiculopathy 07/09/2017  . Gastroesophageal reflux disease with esophagitis 07/05/2017  . Congestive heart failure (HKent 07/05/2017  . Benign prostatic hyperplasia with weak urinary stream 07/05/2017  . Dysphagia 07/05/2017  . DDD (degenerative disc disease), lumbar 07/05/2017  . Coronary artery disease involving native coronary artery of native heart with angina pectoris (HKiln 07/31/2015  . Essential hypertension 07/31/2015  . Hyperlipidemia 07/31/2015  . OSA (obstructive sleep apnea) 07/31/2015   Past Medical History:  Diagnosis Date  . Arthritis   . CHF (congestive heart failure) ([place]   . Coronary artery disease    PCI and Cypher drug-eluting stent placement to the right coronary artery in 2010.  NSTEMI in 2016.  Cardiac catheterization showed patent RCA stent, significant distal left main disease with an FFR ratio of 0.76 and ostial left circumflex stenosis.  Underwent CABG with LIMA to LAD and SVG to OM 2.  . Diabetes mellitus without complication ([place]   . Dyspnea   . GERD (gastroesophageal reflux disease)   . Hypercholesteremia   . Hypertension   . S/P coronary artery bypass graft x 2   . Sleep apnea    uses CPAP   Allergies  Allergen Reactions  . Ace Inhibitors   . Colchicine Other (See Comments)    Palpitations and headache  . Lisinopril Cough       Medications: Outpatient Medications Prior to Visit  Medication Sig  . allopurinol (ZYLOPRIM) 300 MG tablet Take 1 tablet (300 mg total) by mouth daily.  .Marland KitchenamLODipine (NORVASC) 5 MG tablet Take 1 tablet (5 mg total) by mouth 2 (two) times daily.  .Marland Kitchenaspirin 81 MG  tablet Take 81 mg by mouth daily.  .Marland Kitchenatorvastatin (LIPITOR) 40 MG tablet Take 1 tablet (40 mg total) by mouth daily.  . Blood Glucose Monitoring Suppl (FREESTYLE LITE) DEVI To check blood sugar once daily  . carvedilol (COREG) 25 MG tablet Take 1 tablet (25 mg total) by mouth 2 (two) times daily with a meal.  . colchicine 0.6 MG tablet Take 0.6 mg by mouth 2 (two) times daily.  .Marland Kitchenezetimibe (ZETIA) 10 MG tablet Take 1 tablet (10 mg total) by mouth daily.  .Marland Kitchenglucose blood (FREESTYLE LITE) test strip USE TO CHECK BLOOD SUGAR ONCE DAILY  . indomethacin (INDOCIN) 50 MG capsule TAKE ONE CAPSULE BY MOUTH THREE TIMES A DAY WITH MEALS  . isosorbide mononitrate (IMDUR) 60 MG 24 hr tablet Take 1 tablet (60 mg total) by mouth daily.  . Lancets (FREESTYLE) lancets USE TO CHECK BLOOD SUGAR ONCE DAILY  . losartan (COZAAR) 100 MG tablet Take 1 tablet (100 mg total) by mouth daily.  .Marland Kitchen  meloxicam (MOBIC) 7.5 MG tablet Take 7.5 mg by mouth daily.  . metFORMIN (GLUCOPHAGE) 500 MG tablet Take 1 tablet (500 mg total) by mouth 2 (two) times daily with a meal.  . Multiple Vitamin (MULTIVITAMIN WITH MINERALS) TABS tablet Take 1 tablet by mouth daily.  . naproxen sodium (ALEVE) 220 MG tablet Take 440 mg by mouth 2 (two) times daily as needed (mild pain).  . nitroGLYCERIN (NITROSTAT) 0.4 MG SL tablet Place 1 tablet (0.4 mg total) under the tongue every 5 (five) minutes as needed for chest pain.  . pantoprazole (PROTONIX) 40 MG tablet Take 1 tablet (40 mg total) by mouth 2 (two) times daily before a meal.  . potassium chloride (KLOR-CON) 10 MEQ tablet TAKE ONE TABLET BY MOUTH TWICE A DAY AS NEEDED  . pregabalin (LYRICA) 150 MG capsule Take 1 capsule (150 mg total) by mouth 2 (two) times daily.  Marland Kitchen senna (SENOKOT) 8.6 MG TABS tablet Take 1 tablet by mouth daily.  . sucralfate (CARAFATE) 1 GM/10ML suspension Take 10 mLs (1 g total) by mouth 4 (four) times daily -  with meals and at bedtime.  . torsemide (DEMADEX) 20 MG tablet  TAKE TWO TABLETS BY MOUTH TWICE A DAY  . [DISCONTINUED] cyclobenzaprine (FLEXERIL) 10 MG tablet TAKE ONE TABLET BY MOUTH THREE TIMES A DAY AS NEEDED FOR MUSCLE SPASMS  . [DISCONTINUED] Dulaglutide (TRULICITY) 5.73 UK/0.2RK SOPN Inject 0.75 mg into the skin once a week. (Patient not taking: Reported on 08/20/2020)  . [DISCONTINUED] methylPREDNISolone (MEDROL) 4 MG tablet Take dose pack as instructed daily for 21 days   No facility-administered medications prior to visit.    Review of Systems  Constitutional: Negative.   Respiratory: Negative.   Cardiovascular: Negative.   Genitourinary: Negative.  Negative for dysuria.       Objective    BP (!) 157/83   Pulse 71   Resp 16   Wt 248 lb 6.4 oz (112.7 kg)   SpO2 98%   BMI 38.90 kg/m  BP Readings from Last 3 Encounters:  09/27/20 (!) 157/83  08/20/20 120/70  06/28/20 137/73   Wt Readings from Last 3 Encounters:  09/27/20 248 lb 6.4 oz (112.7 kg)  08/20/20 244 lb 6 oz (110.8 kg)  06/28/20 241 lb 6.4 oz (109.5 kg)       Physical Exam Constitutional:      General: He is not in acute distress.    Appearance: Normal appearance. He is well-developed. He is obese. He is not ill-appearing, toxic-appearing or diaphoretic.  HENT:     Head: Normocephalic and atraumatic.     Right Ear: Hearing, tympanic membrane, ear canal and external ear normal.     Left Ear: Hearing, tympanic membrane, ear canal and external ear normal.     Nose: Nose normal.     Mouth/Throat:     Pharynx: Uvula midline. No oropharyngeal exudate.  Eyes:     General: Lids are normal. No scleral icterus.       Right eye: No discharge.        Left eye: No discharge.     Conjunctiva/sclera: Conjunctivae normal.     Pupils: Pupils are equal, round, and reactive to light.  Neck:     Thyroid: No thyromegaly.     Vascular: Normal carotid pulses. No carotid bruit, hepatojugular reflux or JVD.     Trachea: Trachea and phonation normal. No tracheal tenderness or tracheal  deviation.     Meningeal: Brudzinski's sign absent.  Cardiovascular:  Rate and Rhythm: Normal rate and regular rhythm.     Pulses: Normal pulses.     Heart sounds: Normal heart sounds, S1 normal and S2 normal. Heart sounds not distant. No murmur heard. No friction rub. No gallop.   Pulmonary:     Effort: Pulmonary effort is normal. No accessory muscle usage or respiratory distress.     Breath sounds: Normal breath sounds. No stridor. No wheezing or rales.  Chest:     Chest wall: No tenderness.  Abdominal:     General: Bowel sounds are normal. There is no distension.     Palpations: Abdomen is soft. There is no mass.     Tenderness: There is no abdominal tenderness. There is no guarding or rebound.  Musculoskeletal:        General: No tenderness or deformity. Normal range of motion.     Cervical back: Full passive range of motion without pain, normal range of motion and neck supple.  Lymphadenopathy:     Head:     Right side of head: No submental, submandibular, tonsillar, preauricular, posterior auricular or occipital adenopathy.     Left side of head: No submental, submandibular, tonsillar, preauricular, posterior auricular or occipital adenopathy.     Cervical: No cervical adenopathy.  Skin:    General: Skin is warm and dry.     Coloration: Skin is not pale.     Findings: No erythema or rash.     Nails: There is no clubbing.  Neurological:     Mental Status: He is alert and oriented to person, place, and time.     GCS: GCS eye subscore is 4. GCS verbal subscore is 5. GCS motor subscore is 6.     Cranial Nerves: No cranial nerve deficit.     Sensory: No sensory deficit.     Motor: No abnormal muscle tone.     Coordination: Coordination normal.     Deep Tendon Reflexes: Reflexes are normal and symmetric. Reflexes normal.  Psychiatric:        Speech: Speech normal.        Behavior: Behavior normal.        Thought Content: Thought content normal.        Judgment: Judgment  normal.      Results for orders placed or performed in visit on 09/27/20  HgB A1c  Result Value Ref Range   Hgb A1c MFr Bld 9.0 (H) 4.8 - 5.6 %   Est. average glucose Bld gHb Est-mCnc 212 mg/dL  CBC with Differential/Platelet  Result Value Ref Range   WBC 6.3 3.4 - 10.8 x10E3/uL   RBC 4.31 4.14 - 5.80 x10E6/uL   Hemoglobin 13.1 13.0 - 17.7 g/dL   Hematocrit 38.6 37.5 - 51.0 %   MCV 90 79 - 97 fL   MCH 30.4 26.6 - 33.0 pg   MCHC 33.9 31.5 - 35.7 g/dL   RDW 13.8 11.6 - 15.4 %   Platelets 195 150 - 450 x10E3/uL   Neutrophils 56 Not Estab. %   Lymphs 28 Not Estab. %   Monocytes 8 Not Estab. %   Eos 7 Not Estab. %   Basos 1 Not Estab. %   Neutrophils Absolute 3.5 1.4 - 7.0 x10E3/uL   Lymphocytes Absolute 1.8 0.7 - 3.1 x10E3/uL   Monocytes Absolute 0.5 0.1 - 0.9 x10E3/uL   EOS (ABSOLUTE) 0.4 0.0 - 0.4 x10E3/uL   Basophils Absolute 0.1 0.0 - 0.2 x10E3/uL   Immature Granulocytes 0 Not Estab. %  Immature Grans (Abs) 0.0 0.0 - 0.1 x10E3/uL  Comprehensive Metabolic Panel (CMET)  Result Value Ref Range   Glucose 234 (H) 65 - 99 mg/dL   BUN 23 8 - 27 mg/dL   Creatinine, Ser 1.16 0.76 - 1.27 mg/dL   eGFR 70 >59 mL/min/1.73   BUN/Creatinine Ratio 20 10 - 24   Sodium 135 134 - 144 mmol/L   Potassium 4.7 3.5 - 5.2 mmol/L   Chloride 96 96 - 106 mmol/L   CO2 20 20 - 29 mmol/L   Calcium 10.8 (H) 8.6 - 10.2 mg/dL   Total Protein 7.5 6.0 - 8.5 g/dL   Albumin 4.4 3.8 - 4.8 g/dL   Globulin, Total 3.1 1.5 - 4.5 g/dL   Albumin/Globulin Ratio 1.4 1.2 - 2.2   Bilirubin Total 0.4 0.0 - 1.2 mg/dL   Alkaline Phosphatase 89 44 - 121 IU/L   AST 66 (H) 0 - 40 IU/L   ALT 69 (H) 0 - 44 IU/L  Uric acid  Result Value Ref Range   Uric Acid 6.8 3.8 - 8.4 mg/dL  PSA  Result Value Ref Range   Prostate Specific Ag, Serum 5.4 (H) 0.0 - 4.0 ng/mL  POCT glycosylated hemoglobin (Hb A1C)  Result Value Ref Range   Hemoglobin A1C 7.7 (A) 4.0 - 5.6 %   HbA1c POC (<> result, manual entry)     HbA1c, POC  (prediabetic range)     HbA1c, POC (controlled diabetic range)      Assessment & Plan    Type 2 diabetes mellitus with diabetic peripheral angiopathy without gangrene, without long-term current use of insulin (HCC) - Plan: POCT glycosylated hemoglobin (Hb A1C), HgB A1c, CBC with Differential/Platelet, Comprehensive Metabolic Panel (CMET), Uric acid  New onset type 2 diabetes mellitus (HCC) - Plan: Dulaglutide (TRULICITY) 4.81 EH/6.3JS SOPN  DDD (degenerative disc disease), lumbosacral - Plan: cyclobenzaprine (FLEXERIL) 10 MG tablet  Lumbar radiculopathy - Plan: cyclobenzaprine (FLEXERIL) 10 MG tablet  Elevated PSA - Plan: PSA  Meds ordered this encounter  Medications  . Dulaglutide (TRULICITY) 9.64 YO/3.7CH SOPN    Sig: Inject 0.75 mg into the skin once a week.    Dispense:  3 mL    Refill:  4  . cyclobenzaprine (FLEXERIL) 10 MG tablet    Sig: TAKE ONE TABLET BY MOUTH THREE TIMES A DAY AS NEEDED FOR MUSCLE SPASMS    Dispense:  30 tablet    Refill:  0    Return in about 3 months (around 12/27/2020), or if symptoms worsen or fail to improve, for at any time for any worsening symptoms, Go to Emergency room/ urgent care if worse.     Follow up with Cristie Hem on Trulicity   The entirety of the information documented in the History of Present Illness, Review of Systems and Physical Exam were personally obtained by me. Portions of this information were initially documented by the CMA and reviewed by me for thoroughness and accuracy.   Red Flags discussed. The patient was given clear instructions to go to ER or return to medical center if any red flags develop, symptoms do not improve, worsen or new problems develop. They verbalized understanding. Marcille Buffy, [place] [phone] (phone) [phone] (fax)  [place]

## 2020-09-27 NOTE — Progress Notes (Signed)
Jonathon Newman [place],   Because of his [employer], he does not qualify for any patient assistance or coupon options. On the [employer] Formulary, it looks like Trulicity is covered (requires a PA) for $34 at [facility] or $38 at other pharmacies. Ozempic and Victoza are more expensive for him. If this is unaffordable for the patient, we may need to discuss starting alternative therapies rather than continuing with Trulicity samples.     Thanks, Angelena Sole, PharmD, CPP Clinical Pharmacist [facility] [phone]

## 2020-09-27 NOTE — Patient Instructions (Signed)
Hypertension, Adult Hypertension is another name for high blood pressure. High blood pressure forces your heart to work harder to pump blood. This can cause problems over time. There are two numbers in a blood pressure reading. There is a top number (systolic) over a bottom number (diastolic). It is best to have a blood pressure that is below 120/80. Healthy choices can help lower your blood pressure, or you may need medicine to help lower it. What are the causes? The cause of this condition is not known. Some conditions may be related to high blood pressure. What increases the risk?  Smoking.  Having type 2 diabetes mellitus, high cholesterol, or both.  Not getting enough exercise or physical activity.  Being overweight.  Having too much fat, sugar, calories, or salt (sodium) in your diet.  Drinking too much alcohol.  Having long-term (chronic) kidney disease.  Having a family history of high blood pressure.  Age. Risk increases with age.  Race. You may be at higher risk if you are African American.  Gender. Men are at higher risk than women before age 45. After age 65, women are at higher risk than men.  Having obstructive sleep apnea.  Stress. What are the signs or symptoms?  High blood pressure may not cause symptoms. Very high blood pressure (hypertensive crisis) may cause: ? Headache. ? Feelings of worry or nervousness (anxiety). ? Shortness of breath. ? Nosebleed. ? A feeling of being sick to your stomach (nausea). ? Throwing up (vomiting). ? Changes in how you see. ? Very bad chest pain. ? Seizures. How is this treated?  This condition is treated by making healthy lifestyle changes, such as: ? Eating healthy foods. ? Exercising more. ? Drinking less alcohol.  Your health care provider may prescribe medicine if lifestyle changes are not enough to get your blood pressure under control, and if: ? Your top number is above 130. ? Your bottom number is above  80.  Your personal target blood pressure may vary. Follow these instructions at home: Eating and drinking  If told, follow the DASH eating plan. To follow this plan: ? Fill one half of your plate at each meal with fruits and vegetables. ? Fill one fourth of your plate at each meal with whole grains. Whole grains include whole-wheat pasta, brown rice, and whole-grain bread. ? Eat or drink low-fat dairy products, such as skim milk or low-fat yogurt. ? Fill one fourth of your plate at each meal with low-fat (lean) proteins. Low-fat proteins include fish, chicken without skin, eggs, beans, and tofu. ? Avoid fatty meat, cured and processed meat, or chicken with skin. ? Avoid pre-made or processed food.  Eat less than 1,500 mg of salt each day.  Do not drink alcohol if: ? Your doctor tells you not to drink. ? You are pregnant, may be pregnant, or are planning to become pregnant.  If you drink alcohol: ? Limit how much you use to:  0-1 drink a day for women.  0-2 drinks a day for men. ? Be aware of how much alcohol is in your drink. In the [place] one drink equals one 12 oz bottle of beer (355 mL), one 5 oz glass of wine (148 mL), or one 1 oz glass of hard liquor (44 mL).   Lifestyle  Work with your doctor to stay at a healthy weight or to lose weight. Ask your doctor what the best weight is for you.  Get at least 30 minutes of exercise most   days of the week. This may include walking, swimming, or biking.  Get at least 30 minutes of exercise that strengthens your muscles (resistance exercise) at least 3 days a week. This may include lifting weights or doing Pilates.  Do not use any products that contain nicotine or tobacco, such as cigarettes, e-cigarettes, and chewing tobacco. If you need help quitting, ask your doctor.  Check your blood pressure at home as told by your doctor.  Keep all follow-up visits as told by your doctor. This is important.   Medicines  Take over-the-counter  and prescription medicines only as told by your doctor. Follow directions carefully.  Do not skip doses of blood pressure medicine. The medicine does not work as well if you skip doses. Skipping doses also puts you at risk for problems.  Ask your doctor about side effects or reactions to medicines that you should watch for. Contact a doctor if you:  Think you are having a reaction to the medicine you are taking.  Have headaches that keep coming back (recurring).  Feel dizzy.  Have swelling in your ankles.  Have trouble with your vision. Get help right away if you:  Get a very bad headache.  Start to feel mixed up (confused).  Feel weak or numb.  Feel faint.  Have very bad pain in your: ? Chest. ? Belly (abdomen).  Throw up more than once.  Have trouble breathing. Summary  Hypertension is another name for high blood pressure.  High blood pressure forces your heart to work harder to pump blood.  For most people, a normal blood pressure is less than 120/80.  Making healthy choices can help lower blood pressure. If your blood pressure does not get lower with healthy choices, you may need to take medicine. This information is not intended to replace advice given to you by your health care provider. Make sure you discuss any questions you have with your health care provider. Document Revised: 02/09/2018 Document Reviewed: 02/09/2018 Elsevier Patient Education  2021 [employer]. Diabetes Mellitus and Nutrition, Adult When you have diabetes, or diabetes mellitus, it is very important to have healthy eating habits because your blood sugar (glucose) levels are greatly affected by what you eat and drink. Eating healthy foods in the right amounts, at about the same times every day, can help you:  Control your blood glucose.  Lower your risk of heart disease.  Improve your blood pressure.  Reach or maintain a healthy weight. What can affect my meal plan? Every person with  diabetes is different, and each person has different needs for a meal plan. Your health care provider may recommend that you work with a dietitian to make a meal plan that is best for you. Your meal plan may vary depending on factors such as:  The calories you need.  The medicines you take.  Your weight.  Your blood glucose, blood pressure, and cholesterol levels.  Your activity level.  Other health conditions you have, such as heart or kidney disease. How do carbohydrates affect me? Carbohydrates, also called carbs, affect your blood glucose level more than any other type of food. Eating carbs naturally raises the amount of glucose in your blood. Carb counting is a method for keeping track of how many carbs you eat. Counting carbs is important to keep your blood glucose at a healthy level, especially if you use insulin or take certain oral diabetes medicines. It is important to know how many carbs you can safely have in  each meal. This is different for every person. Your dietitian can help you calculate how many carbs you should have at each meal and for each snack. How does alcohol affect me? Alcohol can cause a sudden decrease in blood glucose (hypoglycemia), especially if you use insulin or take certain oral diabetes medicines. Hypoglycemia can be a life-threatening condition. Symptoms of hypoglycemia, such as sleepiness, dizziness, and confusion, are similar to symptoms of having too much alcohol.  Do not drink alcohol if: ? Your health care provider tells you not to drink. ? You are pregnant, may be pregnant, or are planning to become pregnant.  If you drink alcohol: ? Do not drink on an empty stomach. ? Limit how much you use to:  0-1 drink a day for women.  0-2 drinks a day for men. ? Be aware of how much alcohol is in your drink. In the [place] one drink equals one 12 oz bottle of beer (355 mL), one 5 oz glass of wine (148 mL), or one 1 oz glass of hard liquor (44 mL). ? Keep  yourself hydrated with water, diet soda, or unsweetened iced tea.  Keep in mind that regular soda, juice, and other mixers may contain a lot of sugar and must be counted as carbs. What are tips for following this plan? Reading food labels  Start by checking the serving size on the "Nutrition Facts" label of packaged foods and drinks. The amount of calories, carbs, fats, and other nutrients listed on the label is based on one serving of the item. Many items contain more than one serving per package.  Check the total grams (g) of carbs in one serving. You can calculate the number of servings of carbs in one serving by dividing the total carbs by 15. For example, if a food has 30 g of total carbs per serving, it would be equal to 2 servings of carbs.  Check the number of grams (g) of saturated fats and trans fats in one serving. Choose foods that have a low amount or none of these fats.  Check the number of milligrams (mg) of salt (sodium) in one serving. Most people should limit total sodium intake to less than 2,300 mg per day.  Always check the nutrition information of foods labeled as "low-fat" or "nonfat." These foods may be higher in added sugar or refined carbs and should be avoided.  Talk to your dietitian to identify your daily goals for nutrients listed on the label. Shopping  Avoid buying canned, pre-made, or processed foods. These foods tend to be high in fat, sodium, and added sugar.  Shop around the outside edge of the grocery store. This is where you will most often find fresh fruits and vegetables, bulk grains, fresh meats, and fresh dairy. Cooking  Use low-heat cooking methods, such as baking, instead of high-heat cooking methods like deep frying.  Cook using healthy oils, such as olive, canola, or sunflower oil.  Avoid cooking with butter, cream, or high-fat meats. Meal planning  Eat meals and snacks regularly, preferably at the same times every day. Avoid going long  periods of time without eating.  Eat foods that are high in fiber, such as fresh fruits, vegetables, beans, and whole grains. Talk with your dietitian about how many servings of carbs you can eat at each meal.  Eat 4-6 oz (112-168 g) of lean protein each day, such as lean meat, chicken, fish, eggs, or tofu. One ounce (oz) of lean protein is equal  to: ? 1 oz (28 g) of meat, chicken, or fish. ? 1 egg. ?  cup (62 g) of tofu.  Eat some foods each day that contain healthy fats, such as avocado, nuts, seeds, and fish.   What foods should I eat? Fruits Berries. Apples. Oranges. Peaches. Apricots. Plums. Grapes. Mango. Papaya. Pomegranate. Kiwi. Cherries. Vegetables Lettuce. Spinach. Leafy greens, including kale, chard, collard greens, and mustard greens. Beets. Cauliflower. Cabbage. Broccoli. Carrots. Green beans. Tomatoes. Peppers. Onions. Cucumbers. Brussels sprouts. Grains Whole grains, such as whole-wheat or whole-grain bread, crackers, tortillas, cereal, and pasta. Unsweetened oatmeal. Quinoa. Brown or wild rice. Meats and other proteins Seafood. Poultry without skin. Lean cuts of poultry and beef. Tofu. Nuts. Seeds. Dairy Low-fat or fat-free dairy products such as milk, yogurt, and cheese. The items listed above may not be a complete list of foods and beverages you can eat. Contact a dietitian for more information. What foods should I avoid? Fruits Fruits canned with syrup. Vegetables Canned vegetables. Frozen vegetables with butter or cream sauce. Grains Refined white flour and flour products such as bread, pasta, snack foods, and cereals. Avoid all processed foods. Meats and other proteins Fatty cuts of meat. Poultry with skin. Breaded or fried meats. Processed meat. Avoid saturated fats. Dairy Full-fat yogurt, cheese, or milk. Beverages Sweetened drinks, such as soda or iced tea. The items listed above may not be a complete list of foods and beverages you should avoid. Contact a  dietitian for more information. Questions to ask a health care provider  Do I need to meet with a diabetes educator?  Do I need to meet with a dietitian?  What number can I call if I have questions?  When are the best times to check my blood glucose? Where to find more information:  American Diabetes Association: diabetes.org  Academy of Nutrition and Dietetics: www.eatright.[employer] of Diabetes and Digestive and Kidney Diseases: CarFlippers.tn  Association of Diabetes Care and Education Specialists: www.diabeteseducator.org Summary  It is important to have healthy eating habits because your blood sugar (glucose) levels are greatly affected by what you eat and drink.  A healthy meal plan will help you control your blood glucose and maintain a healthy lifestyle.  Your health care provider may recommend that you work with a dietitian to make a meal plan that is best for you.  Keep in mind that carbohydrates (carbs) and alcohol have immediate effects on your blood glucose levels. It is important to count carbs and to use alcohol carefully. This information is not intended to replace advice given to you by your health care provider. Make sure you discuss any questions you have with your health care provider. Document Revised: 05/09/2019 Document Reviewed: 05/09/2019 Elsevier Patient Education  2021 Elsevier Inc. Dulaglutide Injection What is this medicine? DULAGLUTIDE (DOO la GLOO tide) controls blood sugar in people with type 2 diabetes. It is used with lifestyle changes like diet and exercise. It may lower the risk of problems that need treatment in the hospital. These problems include heart attack or stroke. This medicine may be used for other purposes; ask your health care provider or pharmacist if you have questions. COMMON BRAND NAME(S): Trulicity What should I tell my health care provider before I take this medicine? They need to know if you have any of  these conditions:  endocrine tumors (MEN 2) or if someone in your family had these tumors  eye disease, vision problems  history of pancreatitis  kidney disease  liver  disease  stomach or intestine problems  thyroid cancer or if someone in your family had thyroid cancer  an unusual or allergic reaction to dulaglutide, other medicines, foods, dyes, or preservatives  pregnant or trying to get pregnant  breast-feeding How should I use this medicine? This medicine is injected under the skin. You will be taught how to prepare and give it. Take it as directed on the prescription label on the same day of each week. Do NOT prime the pen. Keep taking it unless your health care provider tells you to stop. If you use this medicine with insulin, you should inject this medicine and the insulin separately. Do not mix them together. Do not give the injections right next to each other. Change (rotate) injection sites with each injection. This drug comes with INSTRUCTIONS FOR USE. Ask your pharmacist for directions on how to use this medicine. Read the information carefully. Talk to your pharmacist or health care provider if you have questions. It is important that you put your used needles and syringes in a special sharps container. Do not put them in a trash can. If you do not have a sharps container, call your pharmacist or health care provider to get one. A special MedGuide will be given to you by the pharmacist with each prescription and refill. Be sure to read this information carefully each time. Talk to your health care provider about the use of this medicine in children. Special care may be needed. Overdosage: If you think you have taken too much of this medicine contact a poison control center or emergency room at once. NOTE: This medicine is only for you. Do not share this medicine with others. What if I miss a dose? If you miss a dose, take it as soon as you can unless it is more than 3  days late. If it is more than 3 days late, skip the missed dose. Take the next dose at the normal time. What may interact with this medicine?  other medicines for diabetes Many medications may cause changes in blood sugar, these include:  alcohol containing beverages  antiviral medicines for HIV or AIDS  aspirin and aspirin-like drugs  certain medicines for blood pressure, heart disease, irregular heart beat  chromium  diuretics  male hormones, such as estrogens or progestins, birth control pills  fenofibrate  gemfibrozil  isoniazid  lanreotide  male hormones or anabolic steroids  MAOIs like Carbex, Eldepryl, Marplan, Nardil, and Parnate  medicines for allergies, asthma, cold, or cough  medicines for depression, anxiety, or psychotic disturbances  medicines for weight loss  niacin  nicotine  NSAIDs, medicines for pain and inflammation, like ibuprofen or naproxen  octreotide  pasireotide  pentamidine  phenytoin  probenecid  quinolone antibiotics such as ciprofloxacin, levofloxacin, ofloxacin  some herbal dietary supplements  steroid medicines such as prednisone or cortisone  sulfamethoxazole; trimethoprim  thyroid hormones Some medications can hide the warning symptoms of low blood sugar (hypoglycemia). You may need to monitor your blood sugar more closely if you are taking one of these medications. These include:  beta-blockers, often used for high blood pressure or heart problems (examples include atenolol, metoprolol, propranolol)  clonidine  guanethidine  reserpine This list may not describe all possible interactions. Give your health care provider a list of all the medicines, herbs, non-prescription drugs, or dietary supplements you use. Also tell them if you smoke, drink alcohol, or use illegal drugs. Some items may interact with your medicine. What should I watch  for while using this medicine? Visit your health care provider for  regular checks on your progress. Check with your health care provider if you have severe diarrhea, nausea, and vomiting, or if you sweat a lot. The loss of too much body fluid may make it dangerous for you to take this medicine. A test called the HbA1C (A1C) will be monitored. This is a simple blood test. It measures your blood sugar control over the last 2 to 3 months. You will receive this test every 3 to 6 months. Learn how to check your blood sugar. Learn the symptoms of low and high blood sugar and how to manage them. Always carry a quick-source of sugar with you in case you have symptoms of low blood sugar. Examples include hard sugar candy or glucose tablets. Make sure others know that you can choke if you eat or drink when you develop serious symptoms of low blood sugar, such as seizures or unconsciousness. Get medical help at once. Tell your health care provider if you have high blood sugar. You might need to change the dose of your medicine. If you are sick or exercising more than usual, you may need to change the dose of your medicine. Do not skip meals. Ask your health care provider if you should avoid alcohol. Many nonprescription cough and cold products contain sugar or alcohol. These can affect blood sugar. Pens should never be shared. Even if the needle is changed, sharing may result in passing of viruses like hepatitis or HIV. Wear a medical ID bracelet or chain. Carry a card that describes your condition. List the medicines and doses you take on the card. What side effects may I notice from receiving this medicine? Side effects that you should report to your doctor or health care professional as soon as possible:  allergic reactions (skin rash, itching or hives; swelling of the face, lips, or tongue)  changes in vision  diarrhea that continues or is severe  infection (fever, chills, cough, sore throat, pain or trouble passing urine)  kidney injury (trouble passing urine or  change in the amount of urine)  low blood sugar (feeling anxious; confusion; dizziness; increased hunger; unusually weak or tired; increased sweating; shakiness; cold, clammy skin; irritable; headache; blurred vision; fast heartbeat; loss of consciousness)  lump or swelling on the neck  trouble breathing  trouble swallowing  unusual stomach upset or pain  vomiting Side effects that usually do not require medical attention (report to your doctor or health care professional if they continue or are bothersome):  lack or loss of appetite  nausea  pain, redness, or irritation at site where injected This list may not describe all possible side effects. Call your doctor for medical advice about side effects. You may report side effects to FDA at 1-800-FDA-1088. Where should I keep my medicine? Keep out of the reach of children and pets. Refrigeration (preferred): Store unopened pens in a refrigerator between 2 and 8 degrees C (36 and 46 degrees F). Keep it in the original carton until you are ready to take it. Do not freeze or use if the medicine has been frozen. Protect from light. Get rid of any unused medicine after the expiration date on the label. Room Temperature: The pen may be stored at room temperature below 30 degrees C (86 degrees F) for up to a total of 14 days if needed. Protect from light. Avoid exposure to extreme heat. If it is stored at room temperature, throw away any  unused medicine after 14 days or after it expires, whichever is first. To get rid of medicines that are no longer needed or have expired:  Take the medicine to a medicine take-back program. Check with your pharmacy or law enforcement to find a location.  If you cannot return the medicine, ask your pharmacist or health care provider how to get rid of this medicine safely. NOTE: This sheet is a summary. It may not cover all possible information. If you have questions about this medicine, talk to your doctor,  pharmacist, or health care provider.  2021 Elsevier/Gold Standard (2020-04-01 07:35:51)

## 2020-09-28 LAB — CBC WITH DIFFERENTIAL/PLATELET
Basophils Absolute: 0.1 10*3/uL (ref 0.0–0.2)
Basos: 1 %
EOS (ABSOLUTE): 0.4 10*3/uL (ref 0.0–0.4)
Eos: 7 %
Hematocrit: 38.6 % (ref 37.5–51.0)
Hemoglobin: 13.1 g/dL (ref 13.0–17.7)
Immature Grans (Abs): 0 10*3/uL (ref 0.0–0.1)
Immature Granulocytes: 0 %
Lymphocytes Absolute: 1.8 10*3/uL (ref 0.7–3.1)
Lymphs: 28 %
MCH: 30.4 pg (ref 26.6–33.0)
MCHC: 33.9 g/dL (ref 31.5–35.7)
MCV: 90 fL (ref 79–97)
Monocytes Absolute: 0.5 10*3/uL (ref 0.1–0.9)
Monocytes: 8 %
Neutrophils Absolute: 3.5 10*3/uL (ref 1.4–7.0)
Neutrophils: 56 %
Platelets: 195 10*3/uL (ref 150–450)
RBC: 4.31 x10E6/uL (ref 4.14–5.80)
RDW: 13.8 % (ref 11.6–15.4)
WBC: 6.3 10*3/uL (ref 3.4–10.8)

## 2020-09-28 LAB — COMPREHENSIVE METABOLIC PANEL
ALT: 69 IU/L — ABNORMAL HIGH (ref 0–44)
AST: 66 IU/L — ABNORMAL HIGH (ref 0–40)
Albumin/Globulin Ratio: 1.4 (ref 1.2–2.2)
Albumin: 4.4 g/dL (ref 3.8–4.8)
Alkaline Phosphatase: 89 IU/L (ref 44–121)
BUN/Creatinine Ratio: 20 (ref 10–24)
BUN: 23 mg/dL (ref 8–27)
Bilirubin Total: 0.4 mg/dL (ref 0.0–1.2)
CO2: 20 mmol/L (ref 20–29)
Calcium: 10.8 mg/dL — ABNORMAL HIGH (ref 8.6–10.2)
Chloride: 96 mmol/L (ref 96–106)
Creatinine, Ser: 1.16 mg/dL (ref 0.76–1.27)
Globulin, Total: 3.1 g/dL (ref 1.5–4.5)
Glucose: 234 mg/dL — ABNORMAL HIGH (ref 65–99)
Potassium: 4.7 mmol/L (ref 3.5–5.2)
Sodium: 135 mmol/L (ref 134–144)
Total Protein: 7.5 g/dL (ref 6.0–8.5)
eGFR: 70 mL/min/{1.73_m2} (ref >59)

## 2020-09-28 LAB — URIC ACID: Uric Acid: 6.8 mg/dL (ref 3.8–8.4)

## 2020-09-28 LAB — HEMOGLOBIN A1C
Est. average glucose Bld gHb Est-mCnc: 212 mg/dL
Hgb A1c MFr Bld: 9 % — ABNORMAL HIGH (ref 4.8–5.6)

## 2020-09-28 LAB — PSA: Prostate Specific Ag, Serum: 5.4 ng/mL — ABNORMAL HIGH (ref 0.0–4.0)

## 2020-09-30 ENCOUNTER — Encounter: Payer: Self-pay | Admitting: Adult Health

## 2020-09-30 NOTE — Progress Notes (Signed)
A1C is 9, increased from last visit, he has not been taking Trulicity, was given sample at office visit and Zadie Cleverly D working with patient and insurance to develop plan and lower cost of medication.  Advise endocrinology referral if patient is willing.  CBC is within normal limits.  CMP elevated glucose at 234, calcium increased, add on PTH to labs.  Stable but elevated liver enzymes. Avoid alcohol or excessive tylenol, add on GGT lab.  Keep follow up with PCP Bacigalupo, Marzella Schlein, MD in 3 months.

## 2020-09-30 NOTE — Progress Notes (Signed)
Would you mind reaching out to [facility] and seeing if the 38 or 34 dollars is affordable, he was under the impression he was unable to get it at all. He has two sample boxes to get him through for a few weeks.

## 2020-09-30 NOTE — Telephone Encounter (Signed)
Please review request and approve. KW

## 2020-09-30 NOTE — Telephone Encounter (Signed)
Jonathon Newman, the Lyrica 150 mg has already been refilled so I will deny this request.

## 2020-10-01 NOTE — Progress Notes (Signed)
Will do. Thanks.

## 2020-10-03 ENCOUNTER — Other Ambulatory Visit: Payer: Self-pay

## 2020-10-03 ENCOUNTER — Encounter: Payer: Self-pay | Admitting: Gastroenterology

## 2020-10-03 ENCOUNTER — Ambulatory Visit (INDEPENDENT_AMBULATORY_CARE_PROVIDER_SITE_OTHER): Admitting: Gastroenterology

## 2020-10-03 VITALS — BP 126/69 | HR 71 | Ht 67.0 in | Wt 243.6 lb

## 2020-10-03 DIAGNOSIS — R1310 Dysphagia, unspecified: Secondary | ICD-10-CM

## 2020-10-03 DIAGNOSIS — K2100 Gastro-esophageal reflux disease with esophagitis, without bleeding: Secondary | ICD-10-CM

## 2020-10-03 NOTE — Progress Notes (Signed)
Gastroenterology Consultation  Referring Provider:     Suella Grove* Primary Care Physician:  Erasmo Downer, MD Primary Gastroenterologist:  Dr. Servando Snare     Reason for Consultation:     Dysphagia        HPI:   Jonathon Newman is a 65 y.o. y/o male referred for consultation & management of Dysphagia by Dr. Beryle Flock, Marzella Schlein, MD.  This patient comes in today with a report of long-standing dysphagia.  The symptoms are to both solids and liquids and the patient was seen by Dr. Allegra Lai back in March 2019.  At that time the patient was recommended to undergo a esophageal manometry but he was unable to have the probe placed after multiple attempts by staff members as he describes it. The patient has tried pantoprazole 40 mg twice a day and still reports that he is having some acid breakthrough usually in the evening and at nighttime.  There is no report of any unexplained weight loss.  The patient does report that he has been having some bloating of his abdomen and weight gain.  He denies any constipation. [name] last colonoscopy was in 2018. He denies having a 24-hour pH probe placed.  Past Medical History:  Diagnosis Date  . Arthritis   . CHF (congestive heart failure) (HCC)   . Coronary artery disease    PCI and Cypher drug-eluting stent placement to the right coronary artery in 2010.  NSTEMI in 2016.  Cardiac catheterization showed patent RCA stent, significant distal left main disease with an FFR ratio of 0.76 and ostial left circumflex stenosis.  Underwent CABG with LIMA to LAD and SVG to OM 2.  . Diabetes mellitus without complication (HCC)   . Dyspnea   . GERD (gastroesophageal reflux disease)   . Hypercholesteremia   . Hypertension   . S/P coronary artery bypass graft x 2   . Sleep apnea    uses CPAP    Past Surgical History:  Procedure Laterality Date  . APPENDECTOMY    . BACK SURGERY  11/01/2017   SL 5 and S1  . CARDIAC CATHETERIZATION    . CARPAL  TUNNEL RELEASE     left hand  . CHOLECYSTECTOMY    . CHOLECYSTECTOMY    . CORONARY ARTERY BYPASS GRAFT  04/21/2015   CABG x 2 Capron V.A. LIMA to LAD amd SVG to OM2  . CORONARY STENT PLACEMENT  2010   Cordis Cypher Sirolimus-eluting stent 2.50 mm x 26 mm placed to the RCA at [facility]   . ESOPHAGEAL MANOMETRY N/A 08/04/2017   Procedure: ESOPHAGEAL MANOMETRY (EM);  Surgeon: Toney Reil, MD;  Location: [facility] ENDOSCOPY;  Service: Endoscopy;  Laterality: N/A;  . FRACTURE SURGERY    . KNEE SURGERY    . KNEE SURGERY     right knee   . LAMINECTOMY     "plate in neck Z6-X0"  . LEFT HEART CATH AND CORS/GRAFTS ANGIOGRAPHY N/A 05/02/2018   Procedure: CORS/GRAFTS ANGIOGRAPHY;  Surgeon: Iran Ouch, MD;  Location: [facility] INVASIVE CV LAB;  Service: Cardiovascular;  Laterality: N/A;  . RIGHT/LEFT HEART CATH AND CORONARY ANGIOGRAPHY Bilateral 05/02/2018   Procedure: LEFT HEART CATH;  Surgeon: Iran Ouch, MD;  Location: [facility] INVASIVE CV LAB;  Service: Cardiovascular;  Laterality: Bilateral;  . VASECTOMY      Prior to Admission medications   Medication Sig Start Date End Date Taking? Authorizing Provider  allopurinol (ZYLOPRIM) 300 MG tablet Take  1 tablet (300 mg total) by mouth daily. 07/30/20  Yes Margaretann Loveless, PA-C  amLODipine (NORVASC) 5 MG tablet Take 1 tablet (5 mg total) by mouth 2 (two) times daily. 01/23/20  Yes Antonieta Iba, MD  aspirin 81 MG tablet Take 81 mg by mouth daily.   Yes [provider]  atorvastatin (LIPITOR) 40 MG tablet Take 1 tablet (40 mg total) by mouth daily. 01/23/20  Yes Antonieta Iba, MD  Blood Glucose Monitoring Suppl (FREESTYLE LITE) DEVI To check blood sugar once daily 07/20/19  Yes Margaretann Loveless, PA-C  carvedilol (COREG) 25 MG tablet Take 1 tablet (25 mg total) by mouth 2 (two) times daily with a meal. 01/23/20  Yes Gollan, Tollie Pizza, MD  colchicine 0.6 MG tablet Take 0.6 mg by mouth 2 (two) times  daily. 06/13/19  Yes [provider]  cyclobenzaprine (FLEXERIL) 10 MG tablet TAKE ONE TABLET BY MOUTH THREE TIMES A DAY AS NEEDED FOR MUSCLE SPASMS 09/27/20  Yes Flinchum, Eula Fried, FNP  Dulaglutide (TRULICITY) 0.75 MG/0.5ML SOPN Inject 0.75 mg into the skin once a week. 09/27/20  Yes Flinchum, Eula Fried, FNP  glucose blood (FREESTYLE LITE) test strip USE TO CHECK BLOOD SUGAR ONCE DAILY 08/26/20  Yes Flinchum, Eula Fried, FNP  indomethacin (INDOCIN) 50 MG capsule TAKE ONE CAPSULE BY MOUTH THREE TIMES A DAY WITH MEALS 06/04/20  Yes Joycelyn Man M, PA-C  isosorbide mononitrate (IMDUR) 60 MG 24 hr tablet Take 1 tablet (60 mg total) by mouth daily. 01/23/20  Yes Antonieta Iba, MD  Lancets (FREESTYLE) lancets USE TO CHECK BLOOD SUGAR ONCE DAILY 08/26/20  Yes Flinchum, Eula Fried, FNP  losartan (COZAAR) 100 MG tablet Take 1 tablet (100 mg total) by mouth daily. 11/30/19  Yes Margaretann Loveless, PA-C  meloxicam (MOBIC) 7.5 MG tablet Take 7.5 mg by mouth daily. 07/26/20  Yes [provider]  metFORMIN (GLUCOPHAGE) 500 MG tablet Take 1 tablet (500 mg total) by mouth 2 (two) times daily with a meal. 04/01/20  Yes Burnette, Alessandra Bevels, PA-C  Multiple Vitamin (MULTIVITAMIN WITH MINERALS) TABS tablet Take 1 tablet by mouth daily.   Yes [provider]  naproxen sodium (ALEVE) 220 MG tablet Take 440 mg by mouth 2 (two) times daily as needed (mild pain).   Yes [provider]  nitroGLYCERIN (NITROSTAT) 0.4 MG SL tablet Place 1 tablet (0.4 mg total) under the tongue every 5 (five) minutes as needed for chest pain. 01/22/20  Yes Antonieta Iba, MD  pantoprazole (PROTONIX) 40 MG tablet Take 1 tablet (40 mg total) by mouth 2 (two) times daily before a meal. 08/12/20  Yes Burnette, Victorino Dike M, PA-C  potassium chloride (KLOR-CON) 10 MEQ tablet TAKE ONE TABLET BY MOUTH TWICE A DAY AS NEEDED 08/26/20  Yes Gollan, Tollie Pizza, MD  pregabalin (LYRICA) 150 MG capsule Take 1 capsule  (150 mg total) by mouth 2 (two) times daily. 08/28/20  Yes Margaretann Loveless, PA-C  senna (SENOKOT) 8.6 MG TABS tablet Take 1 tablet by mouth daily.   Yes [provider]  sucralfate (CARAFATE) 1 GM/10ML suspension Take 10 mLs (1 g total) by mouth 4 (four) times daily -  with meals and at bedtime. 08/20/20  Yes Antonieta Iba, MD  torsemide (DEMADEX) 20 MG tablet TAKE TWO TABLETS BY MOUTH TWICE A DAY 08/26/20  Yes Gollan, Tollie Pizza, MD  ezetimibe (ZETIA) 10 MG tablet Take 1 tablet (10 mg total) by mouth daily. 01/26/20 04/25/20  Antonieta Iba, MD    Family History  Problem Relation Age of Onset  . Arthritis Mother   . Hyperlipidemia Father   . Hypertension Father   . Heart attack Father 39  . Heart murmur Brother   . Valvular heart disease Brother   . Cataracts Maternal Grandmother   . Glaucoma Maternal Grandmother   . Cancer Maternal Grandfather   . Heart attack Paternal Grandfather   . Hypertension Paternal Grandfather   . Prostate cancer Neg Hx   . Bladder Cancer Neg Hx   . Kidney cancer Neg Hx      Social History   Tobacco Use  . Smoking status: Former Smoker    Packs/day: 0.25    Years: 2.00    Pack years: 0.50    Types: Cigarettes    Quit date: 05/26/2005    Years since quitting: 15.3  . Smokeless tobacco: Former Neurosurgeon    Types: Engineer, drilling  . Vaping Use: Never used  Substance Use Topics  . Alcohol use: No  . Drug use: No    Allergies as of 10/03/2020 - Review Complete 10/03/2020  Allergen Reaction Noted  . Ace inhibitors  08/03/2012  . Colchicine Other (See Comments) 07/17/2019  . Lisinopril Cough 12/02/2017    Review of Systems:    All systems reviewed and negative except where noted in HPI.   Physical Exam:  BP 126/69   Pulse 71   Ht 5\' 7"  (1.702 m)   Wt 243 lb 9.6 oz (110.5 kg)   BMI 38.15 kg/m  No LMP for male patient. General:   Alert,  Well-developed, well-nourished, pleasant and cooperative in NAD Head:  Normocephalic and  atraumatic. Eyes:  Sclera clear, no icterus.   Conjunctiva pink. Ears:  Normal auditory acuity. Neck:  Supple; no masses or thyromegaly. Lungs:  Respirations even and unlabored.  Clear throughout to auscultation.   No wheezes, crackles, or rhonchi. No acute distress. Heart:  Regular rate and rhythm; no murmurs, clicks, rubs, or gallops. Abdomen:  Normal bowel sounds.  No bruits.  Soft, non-tender and non-distended without masses, hepatosplenomegaly or hernias noted.  No guarding or rebound tenderness.  Negative Carnett sign.   Rectal:  Deferred.  Pulses:  Normal pulses noted. Extremities:  No clubbing or edema.  No cyanosis. Neurologic:  Alert and oriented x3;  grossly normal neurologically. Skin:  Intact without significant lesions or rashes.  No jaundice. Lymph Nodes:  No significant cervical adenopathy. Psych:  Alert and cooperative. Normal mood and affect.  Imaging Studies: No results found.  Assessment and Plan:   Jonathon Newman is a 65 y.o. y/o male who comes in today with a history of heartburn and dysphasia. His symptoms can be related to acid breakthrough with the pantoprazole not working anymore and he has been given samples of Dexilant.  The patient will take that in the evening and see if it helps his symptoms.  The patient will also be set up for a barium swallow to look for any sign of esophageal spasms or nutcracker's esophagus.  If the patient's Dexilant does not help his symptoms then a 24-hour pH probe may be necessary.  The patient has been explained the plan and agrees with the    77, MD. Midge Minium    Note: This dictation was prepared with Dragon dictation along with smaller phrase technology. Any transcriptional errors that result from this process are unintentional.

## 2020-10-04 ENCOUNTER — Encounter: Payer: Self-pay | Admitting: Family Medicine

## 2020-10-04 DIAGNOSIS — R7401 Elevation of levels of liver transaminase levels: Secondary | ICD-10-CM | POA: Insufficient documentation

## 2020-10-04 LAB — PARATHYROID HORMONE, INTACT (NO CA)

## 2020-10-07 NOTE — Progress Notes (Signed)
Needs review by [facility] provider since I am no linger in office there.

## 2020-10-08 ENCOUNTER — Telehealth: Payer: Self-pay

## 2020-10-08 LAB — GAMMA GT: GGT: 98 IU/L — ABNORMAL HIGH (ref 0–65)

## 2020-10-08 LAB — SPECIMEN STATUS REPORT

## 2020-10-08 NOTE — Telephone Encounter (Signed)
Copied from CRM [id number]. Topic: Quick Communication - Other Results (Clinic Use ONLY) >> Oct 08, 2020  2:16 PM Crist Infante wrote: Pt returning call about lab results

## 2020-10-09 ENCOUNTER — Other Ambulatory Visit: Payer: Self-pay

## 2020-10-09 ENCOUNTER — Ambulatory Visit
Admission: RE | Admit: 2020-10-09 | Discharge: 2020-10-09 | Disposition: A | Discharge status: Home / Self Care | Source: Ambulatory Visit | Attending: Gastroenterology | Admitting: Gastroenterology

## 2020-10-09 ENCOUNTER — Other Ambulatory Visit: Payer: Self-pay | Admitting: Gastroenterology

## 2020-10-09 DIAGNOSIS — R1310 Dysphagia, unspecified: Secondary | ICD-10-CM | POA: Insufficient documentation

## 2020-10-09 DIAGNOSIS — K2100 Gastro-esophageal reflux disease with esophagitis, without bleeding: Secondary | ICD-10-CM | POA: Diagnosis present

## 2020-10-15 ENCOUNTER — Telehealth: Payer: Self-pay

## 2020-10-15 ENCOUNTER — Other Ambulatory Visit: Payer: Self-pay

## 2020-10-15 NOTE — Telephone Encounter (Signed)
-----   Message from Midge Minium, MD sent at 10/14/2020  1:34 PM EDT ----- Let the patient know that the swallowing study showed no abnormality in the esophagus but there was some minimal fluid going down his lungs from a problem with swallowing the upper part of his throat.  He should be set up for a modified barium swallow to see if the cause for this could be found.

## 2020-10-15 NOTE — Telephone Encounter (Signed)
Pt notified of results through my chart

## 2020-10-22 ENCOUNTER — Telehealth: Payer: Self-pay

## 2020-10-22 NOTE — Telephone Encounter (Signed)
Sounds like a new problem that needs eval. I am listed as PCP, but have not seen him. Appt can be made with any provider.

## 2020-10-22 NOTE — Telephone Encounter (Signed)
Copied from CRM [id number]. Topic: General - Inquiry >> Oct 22, 2020  3:05 PM Daphine Deutscher D wrote: Reason for CRM: Pt called saying he has been having nausea, dizziness, vomiting for several days.  He wants to know if something can be sent to the pharmacy for him.  He is not having any chest pains or any other symptoms.  CB#  [phone]  [facility]

## 2020-10-23 ENCOUNTER — Other Ambulatory Visit: Payer: Self-pay | Admitting: Physician Assistant

## 2020-10-23 ENCOUNTER — Telehealth: Payer: Self-pay | Admitting: Cardiovascular Disease

## 2020-10-23 DIAGNOSIS — I25118 Atherosclerotic heart disease of native coronary artery with other forms of angina pectoris: Secondary | ICD-10-CM

## 2020-10-23 DIAGNOSIS — I10 Essential (primary) hypertension: Secondary | ICD-10-CM

## 2020-10-23 NOTE — Telephone Encounter (Signed)
Patient believes he got food poisoning after eating packaged eggs and sausage on Monday, Oct 21, 2020. Patient is no longer vomiting but still has nausea and headache. Patient would rather not get evaluation as he believes he is getting better.

## 2020-10-23 NOTE — Telephone Encounter (Signed)
Pt has appt 11/01/20 with Eula Listen, PAC. Will forward clearance notes to [facility] for upcoming appt. Will send FYI to requesting office pt has appt 11/01/20.

## 2020-10-23 NOTE — Telephone Encounter (Signed)
   [facility] Pre-operative Risk Assessment    Patient Name: Jonathon Newman  DOB: [birth year]  MRN: [id number]   HEARTCARE STAFF: - Please ensure there is not already an duplicate clearance open for this procedure. - Under Visit Info/Reason for Call, type in Other and utilize the format Clearance MM/DD/YY or Clearance TBD. Do not use dashes or single digits. - If request is for dental extraction, please clarify the # of teeth to be extracted.  Request for surgical clearance:  1. What type of surgery is being performed? L4-s1 TLIF  2. When is this surgery scheduled? 11/26/20  3. What type of clearance is required (medical clearance vs. Pharmacy clearance to hold med vs. Both)? both  4. Are there any medications that need to be held prior to surgery and how long? Not listed, please advise if needed  5. Practice name and name of physician performing surgery? [facility] - Dr Kayleen Memos  6. What is the office phone number? [place]   7.   What is the office fax number? [phone]  8.   Anesthesia type (None, local, MAC, general) ? General    Caryl Pina Gerringer 10/23/2020, 3:29 PM  _________________________________________________________________   (provider comments below)

## 2020-10-23 NOTE — Telephone Encounter (Signed)
Will address at time of office visit 

## 2020-10-23 NOTE — Telephone Encounter (Signed)
    Jonathon Newman DOB:  Jul 20, 1955  MRN:  [id number]   Primary Cardiologist: Julien Nordmann, MD  Chart reviewed as part of pre-operative protocol coverage.   Patient has an upcoming visit with Eula Listen, PA-C 11/01/20. Would be reasonable to address preop status at that time. Appointment notes updated so provider is aware.   Pre-op covering staff: - Please contact requesting surgeon's office via preferred method (i.e, phone, fax) to inform them of need for appointment prior to surgery.   Beatriz Stallion, PA-C 10/23/2020, 4:53 PM

## 2020-10-25 ENCOUNTER — Telehealth: Payer: Self-pay

## 2020-10-25 NOTE — Chronic Care Management (AMB) (Signed)
10/25/2020- Called patient to remind of appointment with Angelena Sole, CPP on 10/25/2020 at 1pm. Patient aware of appointment date and time and to have all medications, supplements, any blood pressure and/or blood sugar logs available during telephone visit.   Billee Cashing, [facility] Concierge [phone]

## 2020-10-28 ENCOUNTER — Ambulatory Visit

## 2020-10-28 DIAGNOSIS — E1159 Type 2 diabetes mellitus with other circulatory complications: Secondary | ICD-10-CM

## 2020-10-28 DIAGNOSIS — E1169 Type 2 diabetes mellitus with other specified complication: Secondary | ICD-10-CM | POA: Diagnosis not present

## 2020-10-28 DIAGNOSIS — E1151 Type 2 diabetes mellitus with diabetic peripheral angiopathy without gangrene: Secondary | ICD-10-CM

## 2020-10-28 DIAGNOSIS — E785 Hyperlipidemia, unspecified: Secondary | ICD-10-CM | POA: Diagnosis not present

## 2020-10-28 DIAGNOSIS — I152 Hypertension secondary to endocrine disorders: Secondary | ICD-10-CM

## 2020-10-28 MED ORDER — TRULICITY 0.75 MG/0.5ML ~~LOC~~ SOAJ: 0.7500 mg | SUBCUTANEOUS | 0 refills | Status: DC

## 2020-10-28 MED ORDER — METFORMIN HCL ER 500 MG PO TB24: 1000.0000 mg | ORAL_TABLET | Freq: Two times a day (BID) | ORAL | 0 refills | Status: DC

## 2020-10-28 NOTE — Progress Notes (Signed)
Chronic Care Management Pharmacy Note  10/28/2020 Name:  Jonathon Newman MRN:  [id number] DOB:  Apr 25, 1956  Subjective: Jonathon Newman is an 65 y.o. year old male who is a primary patient of Bacigalupo, Dionne Bucy, MD.  The CCM team was consulted for assistance with disease management and care coordination needs.    Engaged with patient by telephone for follow up visit in response to provider referral for pharmacy case management and/or care coordination services.   Consent to Services:  The patient was given information about Chronic Care Management services, agreed to services, and gave verbal consent prior to initiation of services.  Please see initial visit note for detailed documentation.   Patient Care Team: Virginia Crews, MD as PCP - General (Family Medicine) Minna Merritts, MD as PCP - Cardiology (Cardiology) Germaine Pomfret, [facility] (Pharmacist)  Recent office visits: 09/27/20: Patient presented to Laverna Peace, [place] for follow-up. A1c worsened to 9.0%  06/28/20: Patient presented to Fenton Malling, PA-C for follow-up. A1c improved to 7.0%. Lyrica increased to 75 mg BID and 150 mg BID after 2 weeks 05/29/20: Patient presented to Fenton Malling, PA-C for follow-up. Patient started on Lyrica for DDD. Patient started on Trulicity 8.54 mg weekly.    Recent consult visits: None in previous 6 months  Hospital visits: None in previous 6 months  Objective:  Lab Results  Component Value Date   CREATININE 1.16 09/27/2020   BUN 23 09/27/2020   GFRNONAA 63 06/28/2020   GFRAA 72 06/28/2020   NA 135 09/27/2020   K 4.7 09/27/2020   CALCIUM 10.8 (H) 09/27/2020   CO2 20 09/27/2020   GLUCOSE 234 (H) 09/27/2020    Lab Results  Component Value Date/Time   HGBA1C 9.0 (H) 09/27/2020 09:24 AM   HGBA1C 7.7 (A) 09/27/2020 08:59 AM   HGBA1C 7.0 (H) 06/28/2020 09:42 AM   MICROALBUR 50 07/17/2019 07:26 PM    Last diabetic Eye exam: No results found for: HMDIABEYEEXA   Last diabetic Foot exam: No results found for: HMDIABFOOTEX   Lab Results  Component Value Date   CHOL 209 (H) 01/22/2020   HDL 36 (L) 01/22/2020   LDLCALC 93 01/22/2020   TRIG 481 (H) 01/22/2020   CHOLHDL 5.8 (H) 01/22/2020    Hepatic Function Latest Ref Rng & Units 09/27/2020 06/28/2020 01/22/2020  Total Protein 6.0 - 8.5 g/dL 7.5 6.9 7.9  Albumin 3.8 - 4.8 g/dL 4.4 4.3 5.1(H)  AST 0 - 40 IU/L 66(H) 63(H) 65(H)  ALT 0 - 44 IU/L 69(H) 92(H) 89(H)  Alk Phosphatase 44 - 121 IU/L 89 94 84  Total Bilirubin 0.0 - 1.2 mg/dL 0.4 0.3 0.5  Bilirubin, Direct 0.1 - 0.5 mg/dL - - -    Lab Results  Component Value Date/Time   TSH 1.600 02/02/2018 04:19 PM    CBC Latest Ref Rng & Units 09/27/2020 03/11/2019 03/10/2019  WBC 3.4 - 10.8 x10E3/uL 6.3 6.5 7.4  Hemoglobin 13.0 - 17.7 g/dL 13.1 13.0 14.2  Hematocrit 37.5 - 51.0 % 38.6 37.6(L) 40.0  Platelets 150 - 450 x10E3/uL 195 164 204    No results found for: VD25OH  Clinical ASCVD: Yes  The 10-year ASCVD risk score Mikey Bussing DC Jr., et al., 2013) is: 40%   Values used to calculate the score:     Age: 65 years     Sex: Male     Is Non-Hispanic African American: No     Diabetic: Yes     Tobacco smoker: Yes  Systolic Blood Pressure: 149 mmHg     Is BP treated: Yes     HDL Cholesterol: 36 mg/dL     Total Cholesterol: 209 mg/dL    Depression screen [facility] 2/9 03/29/2020 02/02/2018 06/30/2017  Decreased Interest 0 0 0  Down, Depressed, Hopeless 0 0 0  PHQ - 2 Score 0 0 0  Altered sleeping - 0 1  Tired, decreased energy - 0 2  Change in appetite - 0 0  Feeling bad or failure about yourself  - 0 0  Trouble concentrating - 0 0  Moving slowly or fidgety/restless - 0 0  Suicidal thoughts - 0 0  PHQ-9 Score - 0 3  Difficult doing work/chores - Not difficult at all Not difficult at all    Social History   Tobacco Use  Smoking Status Former Smoker  . Packs/day: 0.25  . Years: 2.00  . Pack years: 0.50  . Types: Cigarettes  . Quit date:  05/26/2005  . Years since quitting: 15.4  Smokeless Tobacco Former Systems developer  . Types: Chew   BP Readings from Last 3 Encounters:  10/03/20 126/69  09/27/20 (!) 157/83  08/20/20 120/70   Pulse Readings from Last 3 Encounters:  10/03/20 71  09/27/20 71  08/20/20 68   Wt Readings from Last 3 Encounters:  10/03/20 243 lb 9.6 oz (110.5 kg)  09/27/20 248 lb 6.4 oz (112.7 kg)  08/20/20 244 lb 6 oz (110.8 kg)   BMI Readings from Last 3 Encounters:  10/03/20 38.15 kg/m  09/27/20 38.90 kg/m  08/20/20 38.27 kg/m    Assessment/Interventions: Review of patient past medical history, allergies, medications, health status, including review of consultants reports, laboratory and other test data, was performed as part of comprehensive evaluation and provision of chronic care management services.   SDOH:  (Social Determinants of Health) assessments and interventions performed: Yes SDOH Interventions   Flowsheet Row Most Recent Value  SDOH Interventions   Financial Strain Interventions Intervention Not Indicated     SDOH Screenings   Alcohol Screen: Low Risk   . Last Alcohol Screening Score (AUDIT): 1  Depression (PHQ2-9): Low Risk   . PHQ-2 Score: 0  Financial Resource Strain: High Risk  . Difficulty of Paying Living Expenses: Hard  Food Insecurity: Not on file  Housing: Not on file  Physical Activity: Not on file  Social Connections: Not on file  Stress: Not on file  Tobacco Use: Medium Risk  . Smoking Tobacco Use: Former Smoker  . Smokeless Tobacco Use: Former Soil scientist Needs: No Transportation Needs  . Lack of Transportation (Medical): No  . Lack of Transportation (Non-Medical): No    CCM Care Plan  Allergies  Allergen Reactions  . Ace Inhibitors   . Colchicine Other (See Comments)    Palpitations and headache  . Lisinopril Cough    Medications Reviewed Today    Reviewed by Germaine Pomfret, [facility] (Pharmacist) on 10/28/20 at 1434  Med List Status: <None>   Medication Order Taking? Sig Documenting Provider Last Dose Status Informant  allopurinol (ZYLOPRIM) 300 MG tablet [id number] Yes Take 1 tablet (300 mg total) by mouth daily. Fenton Malling M, PA-C Taking Active   amLODipine (NORVASC) 5 MG tablet [id number] Yes Take 1 tablet (5 mg total) by mouth 2 (two) times daily. Minna Merritts, MD Taking Active   aspirin 81 MG tablet [id number] Yes Take 81 mg by mouth daily. [provider] Taking Active Self  atorvastatin (LIPITOR) 40 MG tablet [id number] Yes  Take 1 tablet (40 mg total) by mouth daily. Minna Merritts, MD Taking Active   Blood Glucose Monitoring Suppl (FREESTYLE LITE) DEVI [id number]  To check blood sugar once daily Fenton Malling M, [place]  Active   carvedilol (COREG) 25 MG tablet [id number] Yes Take 1 tablet (25 mg total) by mouth 2 (two) times daily with a meal. Rockey Situ, Kathlene November, MD Taking Active   cyclobenzaprine (FLEXERIL) 10 MG tablet [id number] Yes TAKE ONE TABLET BY MOUTH THREE TIMES A DAY AS NEEDED FOR MUSCLE SPASMS Flinchum, Kelby Aline, FNP Taking Active   Dulaglutide (TRULICITY) 9.92 EQ/6.8TM SOPN [id number] Yes Inject 0.75 mg into the skin once a week. [place] Crews, MD  Active   ezetimibe (ZETIA) 10 MG tablet [id number] Yes Take 1 tablet (10 mg total) by mouth daily. Minna Merritts, MD Taking Expired 04/25/20 2359   glucose blood (FREESTYLE LITE) test strip [id number]  USE TO CHECK BLOOD SUGAR ONCE DAILY Flinchum, Kelby Aline, FNP  Active   indomethacin (INDOCIN) 50 MG capsule [id number] Yes TAKE ONE CAPSULE BY MOUTH THREE TIMES A DAY WITH MEALS Mar Daring, PA-C Taking Active   isosorbide mononitrate (IMDUR) 60 MG 24 hr tablet [id number] Yes Take 1 tablet (60 mg total) by mouth daily. Minna Merritts, MD Taking Active   Lancets (FREESTYLE) lancets [id number]  USE TO CHECK BLOOD SUGAR ONCE DAILY Flinchum, Kelby Aline, FNP  Active   losartan (COZAAR) 100 MG tablet [id number] Yes TAKE ONE TABLET BY MOUTH  DAILY . NEED TO SCHEDULE AN OFFICE VISIT AND LAB WORK Brita Romp Dionne Bucy, MD Taking Active   meloxicam (MOBIC) 7.5 MG tablet [id number]  Take 7.5 mg by mouth daily. [provider]  Active   metFORMIN (GLUCOPHAGE-XR) 500 MG 24 hr tablet [id number] Yes Take 2 tablets (1,000 mg total) by mouth 2 (two) times daily with a meal. Bacigalupo, Dionne Bucy, MD  Active   Multiple Vitamin (MULTIVITAMIN WITH MINERALS) TABS tablet [id number]  Take 1 tablet by mouth daily. [provider]  Active Self  naproxen sodium (ALEVE) 220 MG tablet [id number]  Take 440 mg by mouth 2 (two) times daily as needed (mild pain). [provider]  Active Self  nitroGLYCERIN (NITROSTAT) 0.4 MG SL tablet [id number]  Place 1 tablet (0.4 mg total) under the tongue every 5 (five) minutes as needed for chest pain. Minna Merritts, MD  Active   pantoprazole (PROTONIX) 40 MG tablet [id number] Yes Take 1 tablet (40 mg total) by mouth 2 (two) times daily before a meal. Mar Daring, PA-C Taking Active   potassium chloride (KLOR-CON) 10 MEQ tablet [id number]  TAKE ONE TABLET BY MOUTH TWICE A DAY AS NEEDED Rockey Situ, Kathlene November, MD  Active   pregabalin (LYRICA) 150 MG capsule [id number]  Take 1 capsule (150 mg total) by mouth 2 (two) times daily. Fenton Malling M, PA-C  Active   senna (SENOKOT) 8.6 MG TABS tablet [id number]  Take 1 tablet by mouth daily. [provider]  Active   sucralfate (CARAFATE) 1 GM/10ML suspension [id number]  Take 10 mLs (1 g total) by mouth 4 (four) times daily -  with meals and at bedtime. Minna Merritts, MD  Active   torsemide (DEMADEX) 20 MG tablet [id number]  TAKE TWO TABLETS BY MOUTH TWICE A DAY Gollan, Kathlene November, MD  Active           Patient Active Problem List   Diagnosis Date Noted  . Elevated transaminase level 10/04/2020  . Morbid  obesity ([place]) 05/20/2020  . T2DM (type 2 diabetes mellitus) ([place]) 07/17/2019  . Benign paroxysmal positional vertigo due to bilateral  vestibular disorder 03/15/2019  . Abnormal findings on diagnostic imaging of lung 03/15/2019  . Chest pain 03/10/2019  . Unstable angina ([place]) 04/25/2018  . SOB (shortness of breath) 04/13/2018  . Dizziness 04/13/2018  . S/P coronary artery bypass graft x 2   . Incomplete bladder emptying 07/21/2017  . Low back pain 07/09/2017  . Lumbar radiculopathy 07/09/2017  . Gastroesophageal reflux disease with esophagitis 07/05/2017  . Congestive heart failure ([place]) 07/05/2017  . Benign prostatic hyperplasia with weak urinary stream 07/05/2017  . Dysphagia 07/05/2017  . DDD (degenerative disc disease), lumbar 07/05/2017  . Coronary artery disease involving native coronary artery of native heart with angina pectoris ([place]) 07/31/2015  . Essential hypertension 07/31/2015  . Hyperlipidemia 07/31/2015  . OSA (obstructive sleep apnea) 07/31/2015    Immunization History  Administered Date(s) Administered  . Moderna Sars-Covid-2 Vaccination 11/28/2019, 12/28/2019  . Pneumococcal Polysaccharide-23 08/03/2012  . Tdap 08/03/2012  . Zoster Recombinat (Shingrix) 12/22/2018    Conditions to be addressed/monitored:  Hypertension, Hyperlipidemia, Diabetes, Heart Failure, Coronary Artery Disease, GERD and Gout  Care Plan : General Pharmacy (Adult)  Updates made by Germaine Pomfret, RPH since 10/28/2020 12:00 AM    Problem: Hypertension, Hyperlipidemia, Diabetes, Heart Failure, Coronary Artery Disease, GERD and Gout   Priority: High    Goal: Patient-Specific Goal   Start Date: 07/29/2020  Expected End Date: 01/27/2021  This Visit's Progress: On track  Recent Progress: On track  Priority: High  Note:   Current Barriers:  . Unable to independently afford treatment regimen  Pharmacist Clinical Goal(s):  Marland Kitchen Over the next 90 days, patient will verbalize ability to afford treatment regimen through collaboration with PharmD and provider.   Interventions: . 1:1 collaboration with Brita Romp Dionne Bucy,  MD regarding development and update of comprehensive plan of care as evidenced by provider attestation and co-signature . Inter-disciplinary care team collaboration (see longitudinal plan of care) . Comprehensive medication review performed; medication list updated in electronic medical record  Hypertension (BP goal <140/90) -Uncontrolled -Current treatment: . Amlodipine 5 mg twice daily  . Carvedilol 25 mg twice daily  . Imdur 60 mg daily  . Losartan 100 mg daily  . Torsemide 20 mg 2 tablets twice daily  -Medications previously tried: NA   -Current home readings: "Elevated" -Patient has been suffering from food poisoning x1 week, still feeling symptomatic. During this time he has noticed his blood pressures have be significantly elevated.   -Counseled to monitor BP at home 3 times weekly, document, and provide log at future appointments -Could consider decreasing amlodipine to help with peripheral edema, would likely need additional BP coverage. Will discuss at next follow-up.   Hyperlipidemia: (LDL goal < 70) -controlled -Current treatment: . Atorvastatin 40 mg daily . Zetia 10 mg daily  -Medications previously tried: NA  -Educated on Importance of limiting foods high in cholesterol; -Recommended to continue current medication  Diabetes (A1c goal <7%) -Uncontrolled -Current medications: Marland Kitchen Metformin 500 mg twice daily  . Trulicity 9.47 mg weekly (used last pen on 07/21/2020)  -Medications previously tried: NA   -Current home glucose readings . fasting glucose: 200-225, one instance of 138, 330  . post prandial glucose: NA -Reports hyperglycemic symptoms: Fatigue, -Educated on A1c and blood sugar goals; Benefits of routine self-monitoring of blood sugar; -Counseled to check feet daily and get yearly eye exams -CHANGE Metformin to Metformin  XR 500 mg two tablets twice daily  -Will retry Trulicity prescription + PA through [employer].   Heart Failure (Goal: control  symptoms and prevent exacerbations) controlled Type: Diastolic -NYHA Class: II (slight limitation of activity) -Ejection fraction: NA (Date: NA) -Current treatment: . Amlodipine 5 mg twice daily  . Carvedilol 25 mg twice daily  . Imdur 60 mg daily  . Losartan 100 mg daily   Torsemide 20 mg 2 tablets twice daily -Medications previously tried: NA -Educated on Importance of weighing daily; if you gain more than 3 pounds in one day or 5 pounds in one week, contact provider's office Proper diuretic administration and potassium supplementation -Recommended decreasing amlodipine to 5 mg daily   Chronic Pain (Goal: Reduce pain and improve quality of life) -uncontrolled -Current treatment  . Cyclobenzaprine 10 mg three times daily as needed - Muscle Spasms (takes most days)  . Indomethacin 50 mg three times daily  . Naproxen 220 mg 2 tablets twice daily as needed . Pregabalin 75 mg two tablets twice daily   Meloxicam 7.5 mg daily  -Medications previously tried: NA  - Patient reports worsening dry mouth in past 1-2 months. Counseled it may be a side effect from increased Pregabalin dose.  -Recently seen at Ortho, prescribed meloxicam + steroid dose pack. Counseled patient on risks of NSAID use and advised to minimize use of indomethacin + naproxen while taking daily meloxicam.  -Recommended to continue current medication  Gout (Goal: Prevent gout flares) -uncontrolled -Current treatment  . Allopurinol 300 mg daily -Last gout flare: 2 weeks ago. Still has flares frequently, but feels they are less severe than prior to allopurinol.  -Medications previously tried: NA  -Recommended increasing allopurinol to 300 mg daily.   GERD (Goal: prevent symptoms of heartburn and reflux ) -controlled -Current treatment  . Pantoprazole 40 mg twice daily  -Medications previously tried: NA  -A few instances of GERD symptoms since starting Trulicity. Patient reports reflux when lying down after eating  dinner. It has improved since he has started propping him self up at night.  -Counseled on diet and exercise extensively  Patient Goals/Self-Care Activities . Over the next 90 days, patient will:  - check glucose daily, document, and provide at future appointments check blood pressure if feeling symptoms of low blood pressure, document, and provide at future appointments  Follow Up Plan: Telephone follow up appointment with care management team member scheduled for:  12/03/2020 at 1:00 PM      Medication Assistance: Patient likely does not qualify for many forms of patient assistance due to [employer]  Patient's preferred pharmacy is:  [facility], [address] [place] [address] Phone: [phone] Fax: [phone]  [facility] [place], [place] [address] [place] [address] Phone: [phone] Fax: [phone]  Uses pill box? Yes Pt endorses 100% compliance  We discussed: Current pharmacy is preferred with insurance plan and patient is satisfied with pharmacy services Patient decided to: Continue current medication management strategy  Care Plan and Follow Up Patient Decision:  Patient agrees to Care Plan and Follow-up.  Plan: Telephone follow up appointment with care management team member scheduled for:  12/03/2020 at 1:00 PM  Junius Argyle, PharmD, [place] [phone]

## 2020-10-28 NOTE — Patient Instructions (Signed)
Visit Information It was great speaking with you today!  Please let me know if you have any questions about our visit.  Goals Addressed            This Visit's Progress   . Monitor and Manage My Blood Sugar-Diabetes Type 2   On track    Timeframe:  Long-Range Goal Priority:  High Start Date:  07/29/2020                           Expected End Date:  01/26/2021                     Follow Up Date 12/03/2020    - check blood sugar at prescribed times - check blood sugar if I feel it is too high or too low    Why is this important?    Checking your blood sugar at home helps to keep it from getting very high or very low.   Writing the results in a diary or log helps the doctor know how to care for you.   Your blood sugar log should have the time, date and the results.   Also, write down the amount of insulin or other medicine that you take.   Other information, like what you ate, exercise done and how you were feeling, will also be helpful.     Notes:        Patient Care Plan: General Pharmacy (Adult)    Problem Identified: Hypertension, Hyperlipidemia, Diabetes, Heart Failure, Coronary Artery Disease, GERD and Gout   Priority: High    Goal: Patient-Specific Goal   Start Date: 07/29/2020  Expected End Date: 01/27/2021  This Visit's Progress: On track  Recent Progress: On track  Priority: High  Note:   Current Barriers:  . Unable to independently afford treatment regimen  Pharmacist Clinical Goal(s):  Marland Kitchen Over the next 90 days, patient will verbalize ability to afford treatment regimen through collaboration with PharmD and provider.   Interventions: . 1:1 collaboration with Beryle Flock Marzella Schlein, MD regarding development and update of comprehensive plan of care as evidenced by provider attestation and co-signature . Inter-disciplinary care team collaboration (see longitudinal plan of care) . Comprehensive medication review performed; medication list updated in electronic  medical record  Hypertension (BP goal <140/90) -Uncontrolled -Current treatment: . Amlodipine 5 mg twice daily  . Carvedilol 25 mg twice daily  . Imdur 60 mg daily  . Losartan 100 mg daily  . Torsemide 20 mg 2 tablets twice daily  -Medications previously tried: NA   -Current home readings: "Elevated" -Patient has been suffering from food poisoning x1 week, still feeling symptomatic. During this time he has noticed his blood pressures have be significantly elevated.   -Counseled to monitor BP at home 3 times weekly, document, and provide log at future appointments -Could consider decreasing amlodipine to help with peripheral edema, would likely need additional BP coverage. Will discuss at next follow-up.   Hyperlipidemia: (LDL goal < 70) -controlled -Current treatment: . Atorvastatin 40 mg daily . Zetia 10 mg daily  -Medications previously tried: NA  -Educated on Importance of limiting foods high in cholesterol; -Recommended to continue current medication  Diabetes (A1c goal <7%) -Uncontrolled -Current medications: Marland Kitchen Metformin 500 mg twice daily  . Trulicity 0.75 mg weekly (used last pen on 07/21/2020)  -Medications previously tried: NA   -Current home glucose readings . fasting glucose: 200-225, one instance of 138, 330  .  post prandial glucose: NA -Reports hyperglycemic symptoms: Fatigue, -Educated on A1c and blood sugar goals; Benefits of routine self-monitoring of blood sugar; -Counseled to check feet daily and get yearly eye exams -CHANGE Metformin to Metformin XR 500 mg two tablets twice daily  -Will retry Trulicity prescription + PA through [employer].   Heart Failure (Goal: control symptoms and prevent exacerbations) controlled Type: Diastolic -NYHA Class: II (slight limitation of activity) -Ejection fraction: NA (Date: NA) -Current treatment: . Amlodipine 5 mg twice daily  . Carvedilol 25 mg twice daily  . Imdur 60 mg daily  . Losartan 100 mg daily    Torsemide 20 mg 2 tablets twice daily -Medications previously tried: NA -Educated on Importance of weighing daily; if you gain more than 3 pounds in one day or 5 pounds in one week, contact provider's office Proper diuretic administration and potassium supplementation -Recommended decreasing amlodipine to 5 mg daily   Chronic Pain (Goal: Reduce pain and improve quality of life) -uncontrolled -Current treatment  . Cyclobenzaprine 10 mg three times daily as needed - Muscle Spasms (takes most days)  . Indomethacin 50 mg three times daily  . Naproxen 220 mg 2 tablets twice daily as needed . Pregabalin 75 mg two tablets twice daily   Meloxicam 7.5 mg daily  -Medications previously tried: NA  - Patient reports worsening dry mouth in past 1-2 months. Counseled it may be a side effect from increased Pregabalin dose.  -Recently seen at Ortho, prescribed meloxicam + steroid dose pack. Counseled patient on risks of NSAID use and advised to minimize use of indomethacin + naproxen while taking daily meloxicam.  -Recommended to continue current medication  Gout (Goal: Prevent gout flares) -uncontrolled -Current treatment  . Allopurinol 300 mg daily -Last gout flare: 2 weeks ago. Still has flares frequently, but feels they are less severe than prior to allopurinol.  -Medications previously tried: NA  -Recommended increasing allopurinol to 300 mg daily.   GERD (Goal: prevent symptoms of heartburn and reflux ) -controlled -Current treatment  . Pantoprazole 40 mg twice daily  -Medications previously tried: NA  -A few instances of GERD symptoms since starting Trulicity. Patient reports reflux when lying down after eating dinner. It has improved since he has started propping him self up at night.  -Counseled on diet and exercise extensively  Patient Goals/Self-Care Activities . Over the next 90 days, patient will:  - check glucose daily, document, and provide at future appointments check blood  pressure if feeling symptoms of low blood pressure, document, and provide at future appointments  Follow Up Plan: Telephone follow up appointment with care management team member scheduled for:  12/03/2020 at 1:00 PM      Patient agreed to services and verbal consent obtained.   The patient verbalized understanding of instructions, educational materials, and care plan provided today and declined offer to receive copy of patient instructions, educational materials, and care plan.   Angelena Sole, PharmD, CPP Clinical Pharmacist [facility] [phone]

## 2020-10-30 ENCOUNTER — Telehealth: Payer: Self-pay | Admitting: Adult Health

## 2020-10-30 DIAGNOSIS — G4733 Obstructive sleep apnea (adult) (pediatric): Secondary | ICD-10-CM

## 2020-10-30 NOTE — Telephone Encounter (Signed)
cpap supplies has been ordered. Jonathon Newman with [employer] is aware and voiced her understanding.  Nothing further needed.

## 2020-10-30 NOTE — Progress Notes (Signed)
Cardiology Office Note    Date:  11/01/2020   ID:  Jonathon Newman, DOB [birth year], MRN [id number]  PCP:  Erasmo Downer, MD  Cardiologist:  Julien Nordmann, MD  Electrophysiologist:  None   Chief Complaint: Preoperative cardiac risk stratification   History of Present Illness:   Jonathon Newman is a 65 y.o. male with history of CAD s/p 2-vessel CABG in 2016 at the [facility], HFpEF, chronic positional dizziness felt to BPPV, prediabetes, HTN, HLD, obesity, sleep apnea on CPAP, chronic back pain, and prior tobacco abuse who presents forpreoperative cardiac risk stratification.  He is retired from [employer] though is no longer eligible for his care through the [employer]. He previously underwent Cypher drug-eluting stent to the RCA in 2010. This was followed by a NSTEMI in 2016 with cardiac cath showing a patent RCA stent with significant distal left main disease with an FFR of 0.76 and ostial LCx stenosis. He underwent 2-vessel CABG with a LIMA to LAD and SVG to OM2 in 2016. He was seen in1/2023for atypical chest pain and exertional dyspnea. He underwent Lexiscan Myoview on 07/08/2017 that showed no evidence of ischemia with a normal EF. He has required adjustments to his outpatient diuretic regimen periodically.  He underwent diagnostic R/LHC on 05/02/2018 that showed significant underlying left main disease with patent grafts including LIMA to LAD and SVG to OM2. The RCA stent was patent with minimal restenosis. He had normal LVSF with a mildly elevated LVEDP at 14-15 mmHg. There was no cardiac culprit for his symptoms based on this cath. Medical therapy was advised.  For intermittent palpitations/dizziness, he underwent outpatient cardiac monitoring that showed NSR with an average heart rate of 72 bpm with a minimum heart rate of 50 bpm, 3 short runs of SVT, no significant arrhythmia to explain the patient's dizziness.  Lexiscan MPI in 10/2019, showed no evidence of  ischemia with an EF of 52%.  He was last seen in the office in 08/2020, noting issues with dysphagia.  Troponin was checked at that time, and noted to be negative.   He comes in doing well from a cardiac perspective.  No chest pain, worsening dyspnea, palpitations, or syncope.  He did recently have a GI illness and with this had some associated dizziness with presyncope in the context of emesis.  He also notes his BP was significantly elevated with this illness with some readings noted to be greater than 200.  Symptoms and BP have subsequently improved.  He has history of chronic back pain and has undergone previous back surgeries.  However, his pain is now affecting his quality of life and he is scheduled to undergo L4-S1 TLIF on 11/26/2020.   Revised Cardiac Risk Index: moderate risk for noncardiac surgery Duke Activity Status Index: >4 METs without cardiac limitation    Labs independently reviewed: 09/2020 - BUN 23, SCr 1.16, potassium 4.7, albumin 4.4, AST 66, ALT 69, HGB 13.1, PLT 195, A1c 9.0 01/2020 - TC 209, TG 481, HDL 36, LDL 93 01/2018 - TSH normal  Past Medical History:  Diagnosis Date  . Arthritis   . CHF (congestive heart failure) (HCC)   . Coronary artery disease    PCI and Cypher drug-eluting stent placement to the right coronary artery in 2010.  NSTEMI in 2016.  Cardiac catheterization showed patent RCA stent, significant distal left main disease with an FFR ratio of 0.76 and ostial left circumflex stenosis.  Underwent CABG with LIMA to LAD  and SVG to OM 2.  . Diabetes mellitus without complication (HCC)   . Dyspnea   . GERD (gastroesophageal reflux disease)   . Hypercholesteremia   . Hypertension   . S/P coronary artery bypass graft x 2   . Sleep apnea    uses CPAP    Past Surgical History:  Procedure Laterality Date  . APPENDECTOMY    . BACK SURGERY  11/01/2017   SL 5 and S1  . CARDIAC CATHETERIZATION    . CARPAL TUNNEL RELEASE     left hand  . CHOLECYSTECTOMY     . CHOLECYSTECTOMY    . CORONARY ARTERY BYPASS GRAFT  04/21/2015   CABG x 2 [place] [facility] LIMA to LAD amd SVG to OM2  . CORONARY STENT PLACEMENT  2010   Cordis Cypher Sirolimus-eluting stent 2.50 mm x 26 mm placed to the RCA at [facility]   . ESOPHAGEAL MANOMETRY N/A 08/04/2017   Procedure: ESOPHAGEAL MANOMETRY (EM);  Surgeon: Toney Reil, MD;  Location: [facility] ENDOSCOPY;  Service: Endoscopy;  Laterality: N/A;  . FRACTURE SURGERY    . KNEE SURGERY    . KNEE SURGERY     right knee   . LAMINECTOMY     "plate in neck K4-Y1"  . LEFT HEART CATH AND CORS/GRAFTS ANGIOGRAPHY N/A 05/02/2018   Procedure: CORS/GRAFTS ANGIOGRAPHY;  Surgeon: Iran Ouch, MD;  Location: [facility] INVASIVE CV LAB;  Service: Cardiovascular;  Laterality: N/A;  . RIGHT/LEFT HEART CATH AND CORONARY ANGIOGRAPHY Bilateral 05/02/2018   Procedure: LEFT HEART CATH;  Surgeon: Iran Ouch, MD;  Location: [facility] INVASIVE CV LAB;  Service: Cardiovascular;  Laterality: Bilateral;  . VASECTOMY      Current Medications: Current Meds  Medication Sig  . allopurinol (ZYLOPRIM) 300 MG tablet Take 1 tablet (300 mg total) by mouth daily.  Marland Kitchen amLODipine (NORVASC) 5 MG tablet Take 1 tablet (5 mg total) by mouth 2 (two) times daily.  Marland Kitchen aspirin 81 MG tablet Take 81 mg by mouth daily.  Marland Kitchen atorvastatin (LIPITOR) 40 MG tablet Take 1 tablet (40 mg total) by mouth daily.  . Blood Glucose Monitoring Suppl (FREESTYLE LITE) DEVI To check blood sugar once daily  . carvedilol (COREG) 25 MG tablet Take 1 tablet (25 mg total) by mouth 2 (two) times daily with a meal.  . cyclobenzaprine (FLEXERIL) 10 MG tablet TAKE ONE TABLET BY MOUTH THREE TIMES A DAY AS NEEDED FOR MUSCLE SPASMS  . ezetimibe (ZETIA) 10 MG tablet Take 1 tablet (10 mg total) by mouth daily.  Marland Kitchen glucose blood (FREESTYLE LITE) test strip USE TO CHECK BLOOD SUGAR ONCE DAILY  . indomethacin (INDOCIN) 50 MG capsule TAKE ONE CAPSULE BY MOUTH THREE TIMES A DAY WITH  MEALS  . isosorbide mononitrate (IMDUR) 60 MG 24 hr tablet Take 1 tablet (60 mg total) by mouth daily.  . Lancets (FREESTYLE) lancets USE TO CHECK BLOOD SUGAR ONCE DAILY  . losartan (COZAAR) 100 MG tablet TAKE ONE TABLET BY MOUTH DAILY . NEED TO SCHEDULE AN OFFICE VISIT AND LAB WORK  . meloxicam (MOBIC) 7.5 MG tablet Take 7.5 mg by mouth daily.  . metFORMIN (GLUCOPHAGE-XR) 500 MG 24 hr tablet Take 2 tablets (1,000 mg total) by mouth 2 (two) times daily with a meal.  . Multiple Vitamin (MULTIVITAMIN WITH MINERALS) TABS tablet Take 1 tablet by mouth daily.  . naproxen sodium (ALEVE) 220 MG tablet Take 440 mg by mouth 2 (two) times daily as needed (mild pain).  Marland Kitchen  nitroGLYCERIN (NITROSTAT) 0.4 MG SL tablet Place 1 tablet (0.4 mg total) under the tongue every 5 (five) minutes as needed for chest pain.  . pantoprazole (PROTONIX) 40 MG tablet Take 1 tablet (40 mg total) by mouth 2 (two) times daily before a meal.  . potassium chloride (KLOR-CON) 10 MEQ tablet TAKE ONE TABLET BY MOUTH TWICE A DAY AS NEEDED  . pregabalin (LYRICA) 150 MG capsule Take 1 capsule (150 mg total) by mouth 2 (two) times daily.  Marland Kitchen senna (SENOKOT) 8.6 MG TABS tablet Take 1 tablet by mouth daily.  . sucralfate (CARAFATE) 1 GM/10ML suspension Take 10 mLs (1 g total) by mouth 4 (four) times daily -  with meals and at bedtime.  . torsemide (DEMADEX) 20 MG tablet TAKE TWO TABLETS BY MOUTH TWICE A DAY    Allergies:   Ace inhibitors, Colchicine, and Lisinopril   Social History   Socioeconomic History  . Marital status: Divorced    Spouse name: Not on file  . Number of children: Not on file  . Years of education: Not on file  . Highest education level: Not on file  Occupational History  . Not on file  Tobacco Use  . Smoking status: Former Smoker    Packs/day: 0.25    Years: 2.00    Pack years: 0.50    Types: Cigarettes    Quit date: 05/26/2005    Years since quitting: 15.4  . Smokeless tobacco: Former Neurosurgeon    Types: Financial planner  . Vaping Use: Never used  Substance and Sexual Activity  . Alcohol use: No  . Drug use: No  . Sexual activity: Not on file  Other Topics Concern  . Not on file  Social History Narrative  . Not on file   Social Determinants of Health   Financial Resource Strain: High Risk  . Difficulty of Paying Living Expenses: Hard  Food Insecurity: Not on file  Transportation Needs: No Transportation Needs  . Lack of Transportation (Medical): No  . Lack of Transportation (Non-Medical): No  Physical Activity: Not on file  Stress: Not on file  Social Connections: Not on file     Family History:  [name] family history includes Arthritis in his mother; Cancer in his maternal grandfather; Cataracts in his maternal grandmother; Glaucoma in his maternal grandmother; Heart attack in his paternal grandfather; Heart attack (age of onset: 46) in his father; Heart murmur in his brother; Hyperlipidemia in his father; Hypertension in his father and paternal grandfather; Valvular heart disease in his brother. There is no history of Prostate cancer, Bladder Cancer, or Kidney cancer.  ROS:   Review of Systems  Constitutional: Negative for chills, diaphoresis, fever, malaise/fatigue and weight loss.  HENT: Negative for congestion.   Eyes: Negative for discharge and redness.  Respiratory: Negative for cough, sputum production, shortness of breath and wheezing.   Cardiovascular: Negative for chest pain, palpitations, orthopnea, claudication, leg swelling and PND.  Gastrointestinal: Negative for abdominal pain, heartburn, nausea and vomiting.  Musculoskeletal: Positive for back pain. Negative for falls and myalgias.  Skin: Negative for rash.  Neurological: Positive for dizziness. Negative for tingling, tremors, sensory change, speech change, focal weakness, loss of consciousness and weakness.  Endo/Heme/Allergies: Does not bruise/bleed easily.  Psychiatric/Behavioral: Negative for substance  abuse. The patient is not nervous/anxious.   All other systems reviewed and are negative.    EKGs/Labs/Other Studies Reviewed:    Studies reviewed were summarized above. The additional studies were reviewed today:  R/LHC 04/2018:  The left ventricular systolic function is normal.  LV end diastolic pressure is mildly elevated.  The left ventricular ejection fraction is 55-65% by visual estimate.  Prox RCA to Mid RCA lesion is 10% stenosed.  Prox RCA lesion is 30% stenosed.  Dist RCA lesion is 20% stenosed.  SVG graft was visualized by angiography.  The graft exhibits mild diffuse disease.  Ost Cx to Prox Cx lesion is 95% stenosed.  Mid LM to Dist LM lesion is 80% stenosed.  LIMA and is normal in caliber.  The graft exhibits no disease.  1. Significant underlying left main and RCA disease with patent grafts including LIMA to LAD and SVG to OM 2. Patent RCA stent with minimal restenosis. 2. Normal LV systolic function. Mildly elevated left ventricular end-diastolic pressure at 14 to 15 mmHg.  Recommendations: I do not see a culprit for the patient symptoms. Even his LVEDP is only mildly elevated. Continue medical therapy for coronary artery disease and chronic diastolic heart failure. __________  Luci BankZio 12/2018: 14-day ZIO patch monitor: Normal sinus rhythm with an average heart rate of 72 bpm. Minimum heart rate was 50 bpm. 3 short runs of SVT. No significant arrhythmia to explain dizziness. __________  Eugenie BirksLexiscan MPI 07/2019: Pharmacological myocardial perfusion imaging study with no significant ischemia Small fixed apical defect of mild severity likely secondary to attenuation artifact Normal wall motion, EF estimated at 52% No EKG changes concerning for ischemia at peak stress or in recovery. Low risk scan   EKG:  EKG is ordered today.  The EKG ordered today demonstrates NSR, 71 bpm, normal axis, prior inferior infarct, no acute ST-T changes  Recent  Labs: 09/27/2020: ALT 69; BUN 23; Creatinine, Ser 1.16; Hemoglobin 13.1; Platelets 195; Potassium 4.7; Sodium 135  Recent Lipid Panel    Component Value Date/Time   CHOL 209 (H) 01/22/2020 0834   TRIG 481 (H) 01/22/2020 0834   HDL 36 (L) 01/22/2020 0834   CHOLHDL 5.8 (H) 01/22/2020 0834   CHOLHDL 5.5 09/08/2017 0721   VLDL 77 (H) 09/08/2017 0721   LDLCALC 93 01/22/2020 0834    PHYSICAL EXAM:    VS:  BP 100/70 (BP Location: Left Arm, Patient Position: Sitting, Cuff Size: Normal)   Pulse 71   Ht 5\' 7"  (1.702 m)   Wt 240 lb 6 oz (109 kg)   SpO2 97%   BMI 37.65 kg/m   BMI: Body mass index is 37.65 kg/m.  Physical Exam  Wt Readings from Last 3 Encounters:  11/01/20 240 lb 6 oz (109 kg)  10/03/20 243 lb 9.6 oz (110.5 kg)  09/27/20 248 lb 6.4 oz (112.7 kg)     ASSESSMENT & PLAN:   1. Preoperative cardiac risk stratification: Scheduled to undergo L4-S1 TLIF on 11/26/2020.  Per Revised Cardiac Risk Index, he is moderate risk for noncardiac surgery.  Per Duke Activity Status Index, he is able to achieve >4 METs without cardiac limitation.  Recent Lexiscan MPI without ischemia.  He may proceed with noncardiac surgery at an overall moderate risk.  No further cardiac testing is indicated at this time.  Our preference would be for him to continue aspirin 81 mg daily throughout the perioperative timeframe.  2. CAD s/p CABG without angina: He is doing well without any symptoms concerning for angina.  Continue secondary prevention with aspirin, atorvastatin, amlodipine, Imdur, carvedilol, and losartan.  No indication for further ischemic testing at this time.  3. HFpEF: He appears euvolemic and well compensated.  He  remains on torsemide 40 mg twice daily with KCl.  4. HTN: Blood pressure slightly low though stable in the office today.  Asymptomatic.  5. HLD: LDL 93 in 01/2020.  He remains on atorvastatin 40 mg.  Consider fasting lipid panel in follow-up.  This was not addressed at today given  problem focused visit for preoperative risk stratification.  6. Chronic dizziness: Improving.  Prior cardiac work-up has been unrevealing.  7. Obesity with sleep apnea: CPAP.  This was not addressed at today as outlined above.  Disposition: F/u with Dr. Mariah Milling or an APP in 6 months.   Medication Adjustments/Labs and Tests Ordered: Current medicines are reviewed at length with the patient today.  Concerns regarding medicines are outlined above. Medication changes, Labs and Tests ordered today are summarized above and listed in the Patient Instructions accessible in Encounters.   Signed, Eula Listen, PA-C 11/01/2020 9:56 AM     [facility] -  [address] [place], [place] [address] [phone]

## 2020-11-01 ENCOUNTER — Ambulatory Visit (INDEPENDENT_AMBULATORY_CARE_PROVIDER_SITE_OTHER): Admitting: Physician Assistant

## 2020-11-01 ENCOUNTER — Other Ambulatory Visit: Payer: Self-pay

## 2020-11-01 ENCOUNTER — Telehealth: Payer: Self-pay | Admitting: Physician Assistant

## 2020-11-01 ENCOUNTER — Encounter: Payer: Self-pay | Admitting: Physician Assistant

## 2020-11-01 VITALS — BP 100/70 | HR 71 | Ht 67.0 in | Wt 240.4 lb

## 2020-11-01 DIAGNOSIS — Z01810 Encounter for preprocedural cardiovascular examination: Secondary | ICD-10-CM | POA: Diagnosis not present

## 2020-11-01 DIAGNOSIS — I5032 Chronic diastolic (congestive) heart failure: Secondary | ICD-10-CM

## 2020-11-01 DIAGNOSIS — I10 Essential (primary) hypertension: Secondary | ICD-10-CM

## 2020-11-01 DIAGNOSIS — I2510 Atherosclerotic heart disease of native coronary artery without angina pectoris: Secondary | ICD-10-CM | POA: Diagnosis not present

## 2020-11-01 DIAGNOSIS — E785 Hyperlipidemia, unspecified: Secondary | ICD-10-CM

## 2020-11-01 DIAGNOSIS — R42 Dizziness and giddiness: Secondary | ICD-10-CM

## 2020-11-01 NOTE — Patient Instructions (Signed)
Medication Instructions:  ?No changes at this time.  ? ?*If you need a refill on your cardiac medications before your next appointment, please call your pharmacy* ? ? ?Lab Work: ?None ? ?If you have labs (blood work) drawn today and your tests are completely normal, you will receive your results only by: ?MyChart Message (if you have MyChart) OR ?A paper copy in the mail ?If you have any lab test that is abnormal or we need to change your treatment, we will call you to review the results. ? ? ?Testing/Procedures: ?None ? ? ?Follow-Up: ?At [facility], you and your health needs are our priority.  As part of our continuing mission to provide you with exceptional heart care, we have created designated Provider Care Teams.  These Care Teams include your primary Cardiologist (physician) and Advanced Practice Providers (APPs -  Physician Assistants and Nurse Practitioners) who all work together to provide you with the care you need, when you need it. ? ? ?Your next appointment:   ?6 month(s) ? ?The format for your next appointment:   ?In Person ? ?Provider:   ?Timothy Gollan, MD or Ryan Dunn, PA-C ?

## 2020-11-01 NOTE — Telephone Encounter (Signed)
Patient States no copay

## 2020-11-04 ENCOUNTER — Encounter: Payer: Self-pay | Admitting: Family Medicine

## 2020-11-04 ENCOUNTER — Other Ambulatory Visit: Payer: Self-pay

## 2020-11-04 ENCOUNTER — Ambulatory Visit (INDEPENDENT_AMBULATORY_CARE_PROVIDER_SITE_OTHER): Admitting: Family Medicine

## 2020-11-04 VITALS — BP 135/77 | HR 75 | Temp 98.4°F | Wt 243.0 lb

## 2020-11-04 DIAGNOSIS — I10 Essential (primary) hypertension: Secondary | ICD-10-CM

## 2020-11-04 DIAGNOSIS — Z951 Presence of aortocoronary bypass graft: Secondary | ICD-10-CM

## 2020-11-04 DIAGNOSIS — M5416 Radiculopathy, lumbar region: Secondary | ICD-10-CM

## 2020-11-04 DIAGNOSIS — E1100 Type 2 diabetes mellitus with hyperosmolarity without nonketotic hyperglycemic-hyperosmolar coma (NKHHC): Secondary | ICD-10-CM

## 2020-11-04 DIAGNOSIS — G4733 Obstructive sleep apnea (adult) (pediatric): Secondary | ICD-10-CM | POA: Diagnosis not present

## 2020-11-04 NOTE — Progress Notes (Signed)
Established patient visit   Patient: Jonathon Newman   DOB: 06/03/1956   65 y.o. Male  MRN: [id number] Visit Date: 11/04/2020  Today's healthcare provider: Dortha Kernennis Athira Janowicz, PA-C   No chief complaint on file.  Subjective    HPI  Patient is a 65 year old male who presents for surgical clearance.  He is scheduled for surgery on 11/26/2020 by Dr Hal Hopearrol for his back.  He has had his EKG already done at the hospital on 11/03/2020.  The patinet states this is his third back surgery.  He has never had any issues with anesthesia.  He has no bleeding or clotting disorder but does report OSA.   Past Medical History:  Diagnosis Date  . Arthritis   . CHF (congestive heart failure) (HCC)   . Coronary artery disease    PCI and Cypher drug-eluting stent placement to the right coronary artery in 2010.  NSTEMI in 2016.  Cardiac catheterization showed patent RCA stent, significant distal left main disease with an FFR ratio of 0.76 and ostial left circumflex stenosis.  Underwent CABG with LIMA to LAD and SVG to OM 2.  . Diabetes mellitus without complication (HCC)   . Dyspnea   . GERD (gastroesophageal reflux disease)   . Hypercholesteremia   . Hypertension   . S/P coronary artery bypass graft x 2   . Sleep apnea    uses CPAP   Past Surgical History:  Procedure Laterality Date  . APPENDECTOMY    . BACK SURGERY  11/01/2017   SL 5 and S1  . CARDIAC CATHETERIZATION    . CARPAL TUNNEL RELEASE     left hand  . CHOLECYSTECTOMY    . CHOLECYSTECTOMY    . CORONARY ARTERY BYPASS GRAFT  04/21/2015   CABG x 2 [facility] LIMA to LAD amd SVG to OM2  . CORONARY STENT PLACEMENT  2010   Cordis Cypher Sirolimus-eluting stent 2.50 mm x 26 mm [facility]   . ESOPHAGEAL MANOMETRY N/A 08/04/2017   Procedure: ESOPHAGEAL MANOMETRY (EM);  Surgeon: Toney ReilVanga, Rohini Reddy, MD;  Location: [facility] ENDOSCOPY;  Service: Endoscopy;  Laterality: N/A;  . FRACTURE SURGERY    . KNEE  SURGERY    . KNEE SURGERY     right knee   . LAMINECTOMY     "plate in neck [id number]"  . LEFT HEART CATH AND CORS/GRAFTS ANGIOGRAPHY N/A 05/02/2018   Procedure: CORS/GRAFTS ANGIOGRAPHY;  Surgeon: Iran OuchArida, Muhammad A, MD;  Location: [facility] INVASIVE CV LAB;  Service: Cardiovascular;  Laterality: N/A;  . RIGHT/LEFT HEART CATH AND CORONARY ANGIOGRAPHY Bilateral 05/02/2018   Procedure: LEFT HEART CATH;  Surgeon: Iran OuchArida, Muhammad A, MD;  Location: [facility] INVASIVE CV LAB;  Service: Cardiovascular;  Laterality: Bilateral;  . VASECTOMY     Family History  Problem Relation Age of Onset  . Arthritis Mother   . Hyperlipidemia Father   . Hypertension Father   . Heart attack Father [id number]  . Heart murmur Brother   . Valvular heart disease Brother   . Cataracts Maternal Grandmother   . Glaucoma Maternal Grandmother   . Cancer Maternal Grandfather   . Heart attack Paternal Grandfather   . Hypertension Paternal Grandfather   . Prostate cancer Neg Hx   . Bladder Cancer Neg Hx   . Kidney cancer Neg Hx    Allergies  Allergen Reactions  . Ace Inhibitors   . Colchicine Other (See Comments)    Palpitations and  headache  . Lisinopril Cough   Social History   Tobacco Use  . Smoking status: Former Smoker    Packs/day: 0.25    Years: 2.00    Pack years: 0.50    Types: Cigarettes    Quit date: 05/26/2005    Years since quitting: 15.4  . Smokeless tobacco: Former Neurosurgeon    Types: Engineer, drilling  . Vaping Use: Never used  Substance Use Topics  . Alcohol use: No  . Drug use: No    Medications: Outpatient Medications Prior to Visit  Medication Sig  . allopurinol (ZYLOPRIM) 300 MG tablet Take 1 tablet (300 mg total) by mouth daily.  Marland Kitchen amLODipine (NORVASC) 5 MG tablet Take 1 tablet (5 mg total) by mouth 2 (two) times daily.  Marland Kitchen aspirin 81 MG tablet Take 81 mg by mouth daily.  Marland Kitchen atorvastatin (LIPITOR) 40 MG tablet Take 1 tablet (40 mg total) by mouth daily.  . Blood Glucose Monitoring Suppl (FREESTYLE  LITE) DEVI To check blood sugar once daily  . carvedilol (COREG) 25 MG tablet Take 1 tablet (25 mg total) by mouth 2 (two) times daily with a meal.  . cyclobenzaprine (FLEXERIL) 10 MG tablet TAKE ONE TABLET BY MOUTH THREE TIMES A DAY AS NEEDED FOR MUSCLE SPASMS  . ezetimibe (ZETIA) 10 MG tablet Take 1 tablet (10 mg total) by mouth daily.  Marland Kitchen glucose blood (FREESTYLE LITE) test strip USE TO CHECK BLOOD SUGAR ONCE DAILY  . indomethacin (INDOCIN) 50 MG capsule TAKE ONE CAPSULE BY MOUTH THREE TIMES A DAY WITH MEALS  . isosorbide mononitrate (IMDUR) 60 MG 24 hr tablet Take 1 tablet (60 mg total) by mouth daily.  . Lancets (FREESTYLE) lancets USE TO CHECK BLOOD SUGAR ONCE DAILY  . losartan (COZAAR) 100 MG tablet TAKE ONE TABLET BY MOUTH DAILY . NEED TO SCHEDULE AN OFFICE VISIT AND LAB WORK  . meloxicam (MOBIC) 7.5 MG tablet Take 7.5 mg by mouth daily.  . metFORMIN (GLUCOPHAGE-XR) 500 MG 24 hr tablet Take 2 tablets (1,000 mg total) by mouth 2 (two) times daily with a meal.  . Multiple Vitamin (MULTIVITAMIN WITH MINERALS) TABS tablet Take 1 tablet by mouth daily.  . naproxen sodium (ALEVE) 220 MG tablet Take 440 mg by mouth 2 (two) times daily as needed (mild pain).  . nitroGLYCERIN (NITROSTAT) 0.4 MG SL tablet Place 1 tablet (0.4 mg total) under the tongue every 5 (five) minutes as needed for chest pain.  . pantoprazole (PROTONIX) 40 MG tablet Take 1 tablet (40 mg total) by mouth 2 (two) times daily before a meal.  . potassium chloride (KLOR-CON) 10 MEQ tablet TAKE ONE TABLET BY MOUTH TWICE A DAY AS NEEDED  . pregabalin (LYRICA) 150 MG capsule Take 1 capsule (150 mg total) by mouth 2 (two) times daily.  Marland Kitchen senna (SENOKOT) 8.6 MG TABS tablet Take 1 tablet by mouth daily.  . sucralfate (CARAFATE) 1 GM/10ML suspension Take 10 mLs (1 g total) by mouth 4 (four) times daily -  with meals and at bedtime.  . torsemide (DEMADEX) 20 MG tablet TAKE TWO TABLETS BY MOUTH TWICE A DAY   No facility-administered  medications prior to visit.    Review of Systems  Respiratory: Negative for cough, shortness of breath and wheezing.   Cardiovascular: Negative for chest pain and palpitations.  Musculoskeletal: Positive for back pain and gait problem.    Last CBC Lab Results  Component Value Date   WBC 6.3 09/27/2020   HGB 13.1  09/27/2020   HCT 38.6 09/27/2020   MCV 90 09/27/2020   MCH 30.4 09/27/2020   RDW 13.8 09/27/2020   PLT 195 09/27/2020   Last metabolic panel Lab Results  Component Value Date   GLUCOSE 234 (H) 09/27/2020   NA 135 09/27/2020   K 4.7 09/27/2020   CL 96 09/27/2020   CO2 20 09/27/2020   BUN 23 09/27/2020   CREATININE 1.16 09/27/2020   GFRNONAA 63 06/28/2020   GFRAA 72 06/28/2020   CALCIUM 10.8 (H) 09/27/2020   PROT 7.5 09/27/2020   ALBUMIN 4.4 09/27/2020   LABGLOB 3.1 09/27/2020   AGRATIO 1.4 09/27/2020   BILITOT 0.4 09/27/2020   ALKPHOS 89 09/27/2020   AST 66 (H) 09/27/2020   ALT 69 (H) 09/27/2020   ANIONGAP 13 03/20/2019   Last lipids Lab Results  Component Value Date   CHOL 209 (H) 01/22/2020   HDL 36 (L) 01/22/2020   LDLCALC 93 01/22/2020   TRIG 481 (H) 01/22/2020   CHOLHDL 5.8 (H) 01/22/2020   Last hemoglobin A1c Lab Results  Component Value Date   HGBA1C 9.0 (H) 09/27/2020   Last thyroid functions Lab Results  Component Value Date   TSH 1.600 02/02/2018       Objective    BP 135/77 (BP Location: Right Arm, Patient Position: Sitting, Cuff Size: Normal)   Pulse 75   Temp 98.4 F (36.9 C) (Oral)   Wt 243 lb (110.2 kg)   SpO2 98%   BMI 38.06 kg/m  BP Readings from Last 3 Encounters:  11/04/20 135/77  11/01/20 100/70  10/03/20 126/69      Physical Exam Constitutional:      General: He is not in acute distress.    Appearance: He is well-developed.  HENT:     Head: Normocephalic and atraumatic.     Right Ear: Hearing normal.     Left Ear: Hearing normal.     Nose: Nose normal.  Eyes:     General: Lids are normal. No scleral  icterus.       Right eye: No discharge.        Left eye: No discharge.     Conjunctiva/sclera: Conjunctivae normal.  Cardiovascular:     Rate and Rhythm: Normal rate and regular rhythm.     Pulses: Normal pulses.     Heart sounds: Normal heart sounds.  Pulmonary:     Effort: Pulmonary effort is normal. No respiratory distress.     Breath sounds: Normal breath sounds.  Abdominal:     General: Bowel sounds are normal.     Palpations: Abdomen is soft.  Musculoskeletal:        General: Tenderness present.     Comments: Lower lumbar midline back pain with right sciatica with tingling and numbness. Scars from 2 previous surgeries in the lower lumbar (L5-S1).   Skin:    Findings: No lesion or rash.  Neurological:     Mental Status: He is alert and oriented to person, place, and time.     Comments: Unable to elicit DTR's arms and legs with history of cervical and lumbar surgeries. Numbness in 5th fingers of each hand. Scar of left palm from CTS surgery.  Psychiatric:        Speech: Speech normal.        Behavior: Behavior normal.        Thought Content: Thought content normal.      No results found for any visits on 11/04/20.  Assessment & Plan  1. Lumbar radiculopathy Has had 2 lumbar surgeries and scheduled for fusion by Dr. Noralyn Pick on 11-26-20. Medically cleared for surgery and general anesthesia with cardiology clearance.  2. S/P coronary artery bypass graft x 2 CABG in 2016. No use of NTG since surgery. Followed by Dr. Mariah Milling (cardiologist - last evaluation in April with clearance for back surgery).  3. Essential hypertension Stable and followed by Cardiologist (Dr. Mariah Milling).  4. OSA (obstructive sleep apnea) Uses self titration CPAP at 8-20 cm H2O each night or with any napping.  5. Type 2 diabetes mellitus with hyperosmolarity without coma, without long-term current use of insulin (HCC) Presently on Metformin 1000 mg BID and trying to get approval for Trulicity through  the [facility] pharmacist.   No follow-ups on file.      I, Niranjan Rufener, PA-C, have reviewed all documentation for this visit. The documentation on 11/04/20 for the exam, diagnosis, procedures, and orders are all accurate and complete.    Dortha Kern, PA-C  [facility] [phone] (phone) [phone] (fax)  [facility]

## 2020-11-22 ENCOUNTER — Telehealth: Payer: Self-pay | Admitting: *Deleted

## 2020-11-22 NOTE — Telephone Encounter (Signed)
Please advise 

## 2020-11-22 NOTE — Telephone Encounter (Signed)
Copied from CRM [id number]. Topic: General - Inquiry >> Nov 22, 2020 12:28 PM Daphine Deutscher D wrote: Reason for CRM: t called saying [facility] called saying A1c was up and they can not do his back surgery until it is stable.  He would ike a call back as to what he needs to do .  CB#  [phone]  if he does not answer please leave a message

## 2020-11-22 NOTE — Telephone Encounter (Signed)
Schedule for recheck of Hgb A1C as the last one was done in April 2022.

## 2020-11-25 ENCOUNTER — Ambulatory Visit: Admitting: Cardiovascular Disease

## 2020-11-25 NOTE — Telephone Encounter (Signed)
Appt  12/05/2020

## 2020-11-29 ENCOUNTER — Other Ambulatory Visit: Payer: Self-pay

## 2020-11-29 MED ORDER — FREESTYLE LANCETS MISC: 3 refills | Status: DC

## 2020-11-29 MED ORDER — FREESTYLE LITE TEST VI STRP: ORAL_STRIP | 3 refills | Status: DC

## 2020-12-03 ENCOUNTER — Ambulatory Visit

## 2020-12-03 DIAGNOSIS — E1151 Type 2 diabetes mellitus with diabetic peripheral angiopathy without gangrene: Secondary | ICD-10-CM

## 2020-12-03 DIAGNOSIS — E1169 Type 2 diabetes mellitus with other specified complication: Secondary | ICD-10-CM

## 2020-12-03 DIAGNOSIS — E1159 Type 2 diabetes mellitus with other circulatory complications: Secondary | ICD-10-CM

## 2020-12-03 NOTE — Progress Notes (Signed)
Chronic Care Management Pharmacy Note  12/25/2020 Name:  Jonathon Newman MRN:  [id number] DOB:  [birth year]  Subjective: Jonathon Newman is an 65 y.o. year old male who is a primary patient of Bacigalupo, Dionne Bucy, MD.  The CCM team was consulted for assistance with disease management and care coordination needs.    Engaged with patient by telephone for follow up visit in response to provider referral for pharmacy case management and/or care coordination services.   Consent to Services:  The patient was given information about Chronic Care Management services, agreed to services, and gave verbal consent prior to initiation of services.  Please see initial visit note for detailed documentation.   Patient Care Team: Virginia Crews, MD as PCP - General (Family Medicine) Rockey Situ Kathlene November, MD as PCP - Cardiology (Cardiology) Germaine Pomfret, [facility] (Pharmacist)  Recent office visits: 11/04/20: Patient presented to Vernie Murders, PA-C for lumbar radiculopathy  09/27/20: Patient presented to Laverna Peace, [place] for follow-up. A1c worsened to 9.0%  06/28/20: Patient presented to Fenton Malling, PA-C for follow-up. A1c improved to 7.0%. Lyrica increased to 75 mg BID and 150 mg BID after 2 weeks 05/29/20: Patient presented to Fenton Malling, PA-C for follow-up. Patient started on Lyrica for DDD. Patient started on Trulicity 6.94 mg weekly.    Recent consult visits: 11/01/20: Patient presented to Christell Faith, PA-C (Cardiology) for follow-up.   Hospital visits: None in previous 6 months  Objective:  Lab Results  Component Value Date   CREATININE 1.16 09/27/2020   BUN 23 09/27/2020   GFRNONAA 63 06/28/2020   GFRAA 72 06/28/2020   NA 135 09/27/2020   K 4.7 09/27/2020   CALCIUM 10.8 (H) 09/27/2020   CO2 20 09/27/2020   GLUCOSE 234 (H) 09/27/2020    Lab Results  Component Value Date/Time   HGBA1C 7.5 (A) 12/05/2020 01:49 PM   HGBA1C 9.0 (H) 09/27/2020 09:24 AM   HGBA1C  7.7 (A) 09/27/2020 08:59 AM   HGBA1C 7.0 (H) 06/28/2020 09:42 AM   MICROALBUR 50 07/17/2019 07:26 PM    Last diabetic Eye exam: No results found for: HMDIABEYEEXA  Last diabetic Foot exam: No results found for: HMDIABFOOTEX   Lab Results  Component Value Date   CHOL 209 (H) 01/22/2020   HDL 36 (L) 01/22/2020   LDLCALC 93 01/22/2020   TRIG 481 (H) 01/22/2020   CHOLHDL 5.8 (H) 01/22/2020    Hepatic Function Latest Ref Rng & Units 09/27/2020 06/28/2020 01/22/2020  Total Protein 6.0 - 8.5 g/dL 7.5 6.9 7.9  Albumin 3.8 - 4.8 g/dL 4.4 4.3 5.1(H)  AST 0 - 40 IU/L 66(H) 63(H) 65(H)  ALT 0 - 44 IU/L 69(H) 92(H) 89(H)  Alk Phosphatase 44 - 121 IU/L 89 94 84  Total Bilirubin 0.0 - 1.2 mg/dL 0.4 0.3 0.5  Bilirubin, Direct 0.1 - 0.5 mg/dL - - -    Lab Results  Component Value Date/Time   TSH 1.600 02/02/2018 04:19 PM    CBC Latest Ref Rng & Units 09/27/2020 03/11/2019 03/10/2019  WBC 3.4 - 10.8 x10E3/uL 6.3 6.5 7.4  Hemoglobin 13.0 - 17.7 g/dL 13.1 13.0 14.2  Hematocrit 37.5 - 51.0 % 38.6 37.6(L) 40.0  Platelets 150 - 450 x10E3/uL 195 164 204    No results found for: VD25OH  Clinical ASCVD: Yes  The 10-year ASCVD risk score Mikey Bussing DC Jr., et al., 2013) is: 40%   Values used to calculate the score:     Age: 33 years  Sex: Male     Is Non-Hispanic African American: No     Diabetic: Yes     Tobacco smoker: Yes     Systolic Blood Pressure: 660 mmHg     Is BP treated: Yes     HDL Cholesterol: 36 mg/dL     Total Cholesterol: 209 mg/dL    Depression screen [facility] 2/9 11/04/2020 03/29/2020 02/02/2018  Decreased Interest 3 0 0  Down, Depressed, Hopeless 0 0 0  PHQ - 2 Score 3 0 0  Altered sleeping 0 - 0  Tired, decreased energy 0 - 0  Change in appetite 0 - 0  Feeling bad or failure about yourself  0 - 0  Trouble concentrating 0 - 0  Moving slowly or fidgety/restless 0 - 0  Suicidal thoughts 0 - 0  PHQ-9 Score 3 - 0  Difficult doing work/chores Not difficult at all - Not difficult at  all    Social History   Tobacco Use  Smoking Status Former   Packs/day: 0.25   Years: 2.00   Pack years: 0.50   Types: Cigarettes   Quit date: 05/26/2005   Years since quitting: 15.5  Smokeless Tobacco Former   Types: Chew   BP Readings from Last 3 Encounters:  12/05/20 126/74  11/04/20 135/77  11/01/20 100/70   Pulse Readings from Last 3 Encounters:  12/05/20 83  11/04/20 75  11/01/20 71   Wt Readings from Last 3 Encounters:  11/04/20 243 lb (110.2 kg)  11/01/20 240 lb 6 oz (109 kg)  10/03/20 243 lb 9.6 oz (110.5 kg)   BMI Readings from Last 3 Encounters:  12/05/20 38.06 kg/m  11/04/20 38.06 kg/m  11/01/20 37.65 kg/m    Assessment/Interventions: Review of patient past medical history, allergies, medications, health status, including review of consultants reports, laboratory and other test data, was performed as part of comprehensive evaluation and provision of chronic care management services.   SDOH:  (Social Determinants of Health) assessments and interventions performed: Yes SDOH Interventions    Flowsheet Row Most Recent Value  SDOH Interventions   Financial Strain Interventions Intervention Not Indicated       SDOH Screenings   Alcohol Screen: Low Risk    Last Alcohol Screening Score (AUDIT): 1  Depression (PHQ2-9): Low Risk    PHQ-2 Score: 3  Financial Resource Strain: Medium Risk   Difficulty of Paying Living Expenses: Somewhat hard  Food Insecurity: Not on file  Housing: Not on file  Physical Activity: Not on file  Social Connections: Not on file  Stress: Not on file  Tobacco Use: Medium Risk   Smoking Tobacco Use: Former   Smokeless Tobacco Use: Former  Transport planner Needs: No Data processing manager (Medical): No   Lack of Transportation (Non-Medical): No    CCM Care Plan  Allergies  Allergen Reactions   Ace Inhibitors    Colchicine Other (See Comments)    Palpitations and headache   Lisinopril Cough     Medications Reviewed Today     Reviewed by Margo Common, PA-C (Physician Assistant) on 12/05/20 at 1452  Med List Status: <None>   Medication Order Taking? Sig Documenting Provider Last Dose Status Informant  allopurinol (ZYLOPRIM) 300 MG tablet [id number] Yes Take 1 tablet (300 mg total) by mouth daily. Fenton Malling M, PA-C Taking Active   amLODipine (NORVASC) 5 MG tablet [id number] Yes Take 1 tablet (5 mg total) by mouth 2 (two) times daily. Minna Merritts, MD Taking Active  aspirin 81 MG tablet [id number] Yes Take 81 mg by mouth daily. [provider] Taking Active Self  atorvastatin (LIPITOR) 40 MG tablet [id number] Yes Take 1 tablet (40 mg total) by mouth daily. Minna Merritts, MD Taking Active   Blood Glucose Monitoring Suppl (FREESTYLE LITE) DEVI [id number] Yes To check blood sugar once daily Mar Daring, [place] Taking Active   carvedilol (COREG) 25 MG tablet [id number] Yes Take 1 tablet (25 mg total) by mouth 2 (two) times daily with a meal. Gollan, Kathlene November, MD Taking Active   cyclobenzaprine (FLEXERIL) 10 MG tablet [id number] Yes TAKE ONE TABLET BY MOUTH THREE TIMES A DAY AS NEEDED FOR MUSCLE SPASMS Flinchum, Kelby Aline, FNP Taking Active   Dulaglutide (TRULICITY) 5.85 FY/9.2KM SOPN [id number]  Inject 0.75 mg into the skin once a week. [provider]  Active   ezetimibe (ZETIA) 10 MG tablet [id number]  Take 1 tablet (10 mg total) by mouth daily. Minna Merritts, MD  Active   glucose blood (FREESTYLE LITE) test strip [id number] Yes USE TO CHECK BLOOD SUGAR ONCE DAILY Brita Romp Dionne Bucy, MD Taking Active   indomethacin (INDOCIN) 50 MG capsule [id number] Yes TAKE ONE CAPSULE BY MOUTH THREE TIMES A DAY WITH MEALS Mar Daring, PA-C Taking Active   isosorbide mononitrate (IMDUR) 60 MG 24 hr tablet [id number] Yes Take 1 tablet (60 mg total) by mouth daily. Minna Merritts, MD Taking Active   Lancets (FREESTYLE) lancets [id number] Yes USE TO  CHECK BLOOD SUGAR ONCE DAILY Brita Romp Dionne Bucy, MD Taking Active   losartan (COZAAR) 100 MG tablet [id number] Yes TAKE ONE TABLET BY MOUTH DAILY . NEED TO SCHEDULE AN OFFICE VISIT AND LAB WORK Brita Romp Dionne Bucy, MD Taking Active   meloxicam (MOBIC) 7.5 MG tablet [id number] Yes Take 7.5 mg by mouth daily. [provider] Taking Active   metFORMIN (GLUCOPHAGE-XR) 500 MG 24 hr tablet [id number] Yes Take 2 tablets (1,000 mg total) by mouth 2 (two) times daily with a meal. Bacigalupo, Dionne Bucy, MD Taking Active   Multiple Vitamin (MULTIVITAMIN WITH MINERALS) TABS tablet [id number] Yes Take 1 tablet by mouth daily. [provider] Taking Active Self  naproxen sodium (ALEVE) 220 MG tablet [id number] Yes Take 440 mg by mouth 2 (two) times daily as needed (mild pain). [provider] Taking Active Self  nitroGLYCERIN (NITROSTAT) 0.4 MG SL tablet [id number] Yes Place 1 tablet (0.4 mg total) under the tongue every 5 (five) minutes as needed for chest pain. Minna Merritts, MD Taking Active   pantoprazole (PROTONIX) 40 MG tablet [id number] Yes Take 1 tablet (40 mg total) by mouth 2 (two) times daily before a meal. Mar Daring, PA-C Taking Active   potassium chloride (KLOR-CON) 10 MEQ tablet [id number] Yes TAKE ONE TABLET BY MOUTH TWICE A DAY AS NEEDED Rockey Situ, Kathlene November, MD Taking Active   pregabalin (LYRICA) 150 MG capsule [id number] Yes Take 1 capsule (150 mg total) by mouth 2 (two) times daily. Mar Daring, PA-C Taking Active   senna (SENOKOT) 8.6 MG TABS tablet [id number] Yes Take 1 tablet by mouth daily. [provider] Taking Active   sucralfate (CARAFATE) 1 GM/10ML suspension [id number] Yes Take 10 mLs (1 g total) by mouth 4 (four) times daily -  with meals and at bedtime. Minna Merritts, MD Taking Active   torsemide (DEMADEX) 20 MG tablet [id number] Yes TAKE TWO TABLETS BY MOUTH TWICE A DAY Gollan, Kathlene November, MD Taking Active  Patient  Active Problem List   Diagnosis Date Noted   Elevated transaminase level 10/04/2020   Morbid obesity ([place]) 05/20/2020   T2DM (type 2 diabetes mellitus) ([place]) 07/17/2019   Benign paroxysmal positional vertigo due to bilateral vestibular disorder 03/15/2019   Abnormal findings on diagnostic imaging of lung 03/15/2019   Chest pain 03/10/2019   Unstable angina ([place]) 04/25/2018   SOB (shortness of breath) 04/13/2018   Dizziness 04/13/2018   S/P coronary artery bypass graft x 2    Incomplete bladder emptying 07/21/2017   Low back pain 07/09/2017   Lumbar radiculopathy 07/09/2017   Gastroesophageal reflux disease with esophagitis 07/05/2017   Congestive heart failure ([place]) 07/05/2017   Benign prostatic hyperplasia with weak urinary stream 07/05/2017   Dysphagia 07/05/2017   DDD (degenerative disc disease), lumbar 07/05/2017   Coronary artery disease involving native coronary artery of native heart with angina pectoris ([place]) 07/31/2015   Essential hypertension 07/31/2015   Hyperlipidemia 07/31/2015   OSA (obstructive sleep apnea) 07/31/2015    Immunization History  Administered Date(s) Administered   Moderna Sars-Covid-2 Vaccination 11/28/2019, 12/28/2019   Pneumococcal Polysaccharide-23 08/03/2012   Tdap 08/03/2012   Zoster Recombinat (Shingrix) 12/22/2018    Conditions to be addressed/monitored:  Hypertension, Hyperlipidemia, Diabetes, Heart Failure, Coronary Artery Disease, GERD and Gout  Care Plan : General Pharmacy (Adult)  Updates made by Germaine Pomfret, RPH since 12/25/2020 12:00 AM     Problem: Hypertension, Hyperlipidemia, Diabetes, Heart Failure, Coronary Artery Disease, GERD and Gout   Priority: High     Long-Range Goal: Patient-Specific Goal   Start Date: 07/29/2020  Expected End Date: 01/27/2021  This Visit's Progress: On track  Recent Progress: On track  Priority: High  Note:   Current Barriers:  Unable to independently afford treatment  regimen  Pharmacist Clinical Goal(s):  Over the next 90 days, patient will verbalize ability to afford treatment regimen through collaboration with PharmD and provider.   Interventions: 1:1 collaboration with Virginia Crews, MD regarding development and update of comprehensive plan of care as evidenced by provider attestation and co-signature Inter-disciplinary care team collaboration (see longitudinal plan of care) Comprehensive medication review performed; medication list updated in electronic medical record  Hyperlipidemia: (LDL goal < 70) -controlled -Current treatment: Atorvastatin 40 mg daily Zetia 10 mg daily  -Medications previously tried: NA  -Educated on Importance of limiting foods high in cholesterol; -Recommended to continue current medication  Diabetes (A1c goal <7%) -Uncontrolled -Current medications: Metformin ER 500 mg twice daily  Trulicity 9.74 mg weekly (Started 11/11/20) -Medications previously tried: NA   -Current home glucose readings fasting glucose: 121, 153.   post prandial glucose: NA -Reports hyperglycemic symptoms: dry mouth, fatigue  -Patient able to get Trulicity approved through [employer] and reports it is affordable.  -tolerating Trulicity well, has noticed some lessened appetite. Patient prefers to wait until a1c check prior to increasing Trulicity.  Continue current medications   Heart Failure (Goal: control symptoms and prevent exacerbations) Controlled Type: Diastolic -NYHA Class: II (slight limitation of activity) -Ejection fraction: NA (Date: NA) -Current treatment: Amlodipine 5 mg twice daily  Carvedilol 25 mg twice daily  Imdur 60 mg daily  Losartan 100 mg daily  Torsemide 20 mg 2 tablets twice daily -Medications previously tried: NA -Educated on Importance of weighing daily; if you gain more than 3 pounds in one day or 5 pounds in one week, contact provider's office Proper diuretic administration and potassium  supplementation -Continue current medications    Chronic Pain (  Goal: Reduce pain and improve quality of life) -Controlled -Current treatment  Cyclobenzaprine 10 mg three times daily as needed - Muscle Spasms (takes most days)  Indomethacin 50 mg three times daily  Naproxen 220 mg 2 tablets twice daily as needed Pregabalin 150 mg twice daily  Meloxicam 7.5 mg daily  -Medications previously tried: NA  Continue current medications   Gout (Goal: Prevent gout flares) -Controlled -Current treatment  Allopurinol 300 mg daily -Last gout flare: >1 month prior -Medications previously tried: NA  Continue current medications  GERD (Goal: prevent symptoms of heartburn and reflux) -controlled -Current treatment  Pantoprazole 40 mg twice daily  -Medications previously tried: NA  -Continue current medications  Patient Goals/Self-Care Activities Over the next 90 days, patient will:  - check glucose daily, document, and provide at future appointments check blood pressure if feeling symptoms of low blood pressure, document, and provide at future appointments  Follow Up Plan: Patient is not eligible for CCM services. Please reconsult if additional medication assistance needed.        Medication Assistance:  Patient likely does not qualify for many forms of patient assistance due to [employer]  Patient's preferred pharmacy is:  [id number] - [place], [place] - [address] [place] [address] Phone: [phone] Fax: [phone]  [facility] [place], [place] [address] [place] [address] Phone: [phone] Fax: [phone]  Uses pill box? Yes Pt endorses 100% compliance  We discussed: Current pharmacy is preferred with insurance plan and patient is satisfied with pharmacy services Patient decided to: Continue current medication management strategy  Care Plan and Follow Up  Patient Decision:  Patient agrees to Care Plan and Follow-up.  Plan:  Patient is not eligible for CCM services. Please reconsult if additional medication assistance needed.   Junius Argyle, PharmD, [place] [phone]

## 2020-12-05 ENCOUNTER — Ambulatory Visit (INDEPENDENT_AMBULATORY_CARE_PROVIDER_SITE_OTHER): Admitting: Family Medicine

## 2020-12-05 ENCOUNTER — Other Ambulatory Visit: Payer: Self-pay

## 2020-12-05 ENCOUNTER — Encounter: Payer: Self-pay | Admitting: Family Medicine

## 2020-12-05 VITALS — BP 126/74 | HR 83 | Temp 98.3°F | Resp 16 | Ht 67.0 in

## 2020-12-05 DIAGNOSIS — E1100 Type 2 diabetes mellitus with hyperosmolarity without nonketotic hyperglycemic-hyperosmolar coma (NKHHC): Secondary | ICD-10-CM

## 2020-12-05 LAB — POCT GLYCOSYLATED HEMOGLOBIN (HGB A1C): Hemoglobin A1C: 7.5 % — AB (ref 4.0–5.6)

## 2020-12-05 NOTE — Progress Notes (Signed)
Established patient visit      Patient: Jonathon Newman   DOB: January 23, 1956   65 y.o. Male  MRN: [id number]  Visit Date: 12/05/2020    Today's healthcare provider: Dortha Kern, PA-C     Chief Complaint   Patient presents with    Diabetes     Subjective      HPI   Diabetes Mellitus Type II, Follow-up    Lab Results   Component Value Date    HGBA1C 7.5 (A) 12/05/2020    HGBA1C 9.0 (H) 09/27/2020    HGBA1C 7.7 (A) 09/27/2020     Wt Readings from Last 3 Encounters:   11/04/20 243 lb (110.2 kg)   11/01/20 240 lb 6 oz (109 kg)   10/03/20 243 lb 9.6 oz (110.5 kg)     Last seen for diabetes 1 months ago.   Management since then includes no medication changes.  He reports good compliance with treatment.  He is not having side effects.   Symptoms:  No fatigue No foot ulcerations   No appetite changes No nausea   No paresthesia of the feet  No polydipsia   No polyuria No visual disturbances    No vomiting        Home blood sugar records: fasting range: 130s.    Episodes of hypoglycemia? No      Current insulin regiment: none.   Most Recent Eye Exam: due  Current exercise: none  Current diet habits: well balanced    Pertinent Labs:  Lab Results   Component Value Date    CHOL 209 (H) 01/22/2020    HDL 36 (L) 01/22/2020    LDLCALC 93 01/22/2020    TRIG 481 (H) 01/22/2020    CHOLHDL 5.8 (H) 01/22/2020    Lab Results   Component Value Date    NA 135 09/27/2020    K 4.7 09/27/2020    CREATININE 1.16 09/27/2020    GFRNONAA 63 06/28/2020    GFRAA 72 06/28/2020    GLUCOSE 234 (H) 09/27/2020            Past Medical History:   Diagnosis Date    Arthritis     CHF (congestive heart failure) (HCC)     Coronary artery disease     PCI and Cypher drug-eluting stent placement to the right coronary artery  in 2010.  NSTEMI in 2016.  Cardiac catheterization showed patent RCA  stent, significant distal left main disease with an FFR ratio of 0.76 and  ostial left  circumflex stenosis.  Underwent CABG with LIMA to LAD and SVG  to OM 2.    Diabetes mellitus without complication (HCC)     Dyspnea     GERD (gastroesophageal reflux disease)     Hypercholesteremia     Hypertension     S/P coronary artery bypass graft x 2     Sleep apnea     uses CPAP     Past Surgical History:   Procedure Laterality Date    APPENDECTOMY      BACK SURGERY  11/01/2017    SL 5 and S1    CARDIAC CATHETERIZATION      CARPAL TUNNEL RELEASE      left hand    CHOLECYSTECTOMY      CHOLECYSTECTOMY      CORONARY ARTERY BYPASS GRAFT  04/21/2015    CABG x 2 [place] [facility] LIMA to LAD amd SVG to OM2    CORONARY  STENT PLACEMENT  2010    Cordis Cypher Sirolimus-eluting stent 2.50 mm x 26 mm placed to the RCA  at [facility]     ESOPHAGEAL MANOMETRY N/A 08/04/2017    Procedure: ESOPHAGEAL MANOMETRY (EM);  Surgeon: Toney Reil, MD;   Location: [facility] ENDOSCOPY;  Service: Endoscopy;  Laterality: N/A;    FRACTURE SURGERY      KNEE SURGERY      KNEE SURGERY      right knee     LAMINECTOMY      "plate in neck T7-S1"    LEFT HEART CATH AND CORS/GRAFTS ANGIOGRAPHY N/A 05/02/2018    Procedure: CORS/GRAFTS ANGIOGRAPHY;  Surgeon: Iran Ouch, MD;   Location: [facility] INVASIVE CV LAB;  Service: Cardiovascular;  Laterality:  N/A;    RIGHT/LEFT HEART CATH AND CORONARY ANGIOGRAPHY Bilateral 05/02/2018    Procedure: LEFT HEART CATH;  Surgeon: Iran Ouch, MD;  Location:  [facility] INVASIVE CV LAB;  Service: Cardiovascular;  Laterality: Bilateral;    VASECTOMY       Social History     Tobacco Use    Smoking status: Former     Packs/day: 0.25     Years: 2.00     Pack years: 0.50     Types: Cigarettes     Quit date: 05/26/2005     Years since quitting: 15.5    Smokeless tobacco: Former     Types: Catering manager Use: Never used   Substance Use Topics    Alcohol use: No    Drug use: No     Family Status   Relation Name Status    Mother   Deceased    Father  Deceased    Brother  Deceased    Brother  Alive    MGM  (Not Specified)    MGF  (Not Specified)    PGF  (Not Specified)    Brother  Alive    Brother  Alive    Neg Hx  (Not Specified)     Allergies   Allergen Reactions    Ace Inhibitors     Colchicine Other (See Comments)     Palpitations and headache    Lisinopril Cough           Medications:  Outpatient Medications Prior to Visit   Medication Sig    allopurinol (ZYLOPRIM) 300 MG tablet Take 1 tablet (300 mg total) by  mouth daily.    amLODipine (NORVASC) 5 MG tablet Take 1 tablet (5 mg total) by mouth 2  (two) times daily.    aspirin 81 MG tablet Take 81 mg by mouth daily.    atorvastatin (LIPITOR) 40 MG tablet Take 1 tablet (40 mg total) by mouth  daily.    Blood Glucose Monitoring Suppl (FREESTYLE LITE) DEVI To check blood sugar  once daily    carvedilol (COREG) 25 MG tablet Take 1 tablet (25 mg total) by mouth 2  (two) times daily with a meal.    cyclobenzaprine (FLEXERIL) 10 MG tablet TAKE ONE TABLET BY MOUTH THREE  TIMES A DAY AS NEEDED FOR MUSCLE SPASMS    glucose blood (FREESTYLE LITE) test strip USE TO CHECK BLOOD SUGAR ONCE  DAILY    indomethacin (INDOCIN) 50 MG capsule TAKE ONE CAPSULE BY MOUTH THREE  TIMES A DAY WITH MEALS    isosorbide mononitrate (IMDUR) 60 MG 24 hr tablet Take 1 tablet (60 mg  total) by mouth daily.  Lancets (FREESTYLE) lancets USE TO CHECK BLOOD SUGAR ONCE DAILY    losartan (COZAAR) 100 MG tablet TAKE ONE TABLET BY MOUTH DAILY . NEED TO  SCHEDULE AN OFFICE VISIT AND LAB WORK    meloxicam (MOBIC) 7.5 MG tablet Take 7.5 mg by mouth daily.    metFORMIN (GLUCOPHAGE-XR) 500 MG 24 hr tablet Take 2 tablets (1,000 mg  total) by mouth 2 (two) times daily with a meal.    Multiple Vitamin (MULTIVITAMIN WITH MINERALS) TABS tablet Take 1 tablet  by mouth daily.    naproxen sodium (ALEVE) 220 MG tablet Take 440 mg by mouth 2 (two) times  daily as needed (mild pain).    nitroGLYCERIN  (NITROSTAT) 0.4 MG SL tablet Place 1 tablet (0.4 mg total)  under the tongue every 5 (five) minutes as needed for chest pain.    pantoprazole (PROTONIX) 40 MG tablet Take 1 tablet (40 mg total) by mouth  2 (two) times daily before a meal.    potassium chloride (KLOR-CON) 10 MEQ tablet TAKE ONE TABLET BY MOUTH  TWICE A DAY AS NEEDED    pregabalin (LYRICA) 150 MG capsule Take 1 capsule (150 mg total) by mouth  2 (two) times daily.    senna (SENOKOT) 8.6 MG TABS tablet Take 1 tablet by mouth daily.    sucralfate (CARAFATE) 1 GM/10ML suspension Take 10 mLs (1 g total) by  mouth 4 (four) times daily -  with meals and at bedtime.    torsemide (DEMADEX) 20 MG tablet TAKE TWO TABLETS BY MOUTH TWICE A DAY    Dulaglutide (TRULICITY) 0.75 MG/0.5ML SOPN Inject 0.75 mg into the skin  once a week.    ezetimibe (ZETIA) 10 MG tablet Take 1 tablet (10 mg total) by mouth  daily.     No facility-administered medications prior to visit.       Review of Systems    Last metabolic panel  Lab Results   Component Value Date    GLUCOSE 234 (H) 09/27/2020    NA 135 09/27/2020    K 4.7 09/27/2020    CL 96 09/27/2020    CO2 20 09/27/2020    BUN 23 09/27/2020    CREATININE 1.16 09/27/2020    GFRNONAA 63 06/28/2020    GFRAA 72 06/28/2020    CALCIUM 10.8 (H) 09/27/2020    PROT 7.5 09/27/2020    ALBUMIN 4.4 09/27/2020    LABGLOB 3.1 09/27/2020    AGRATIO 1.4 09/27/2020    BILITOT 0.4 09/27/2020    ALKPHOS 89 09/27/2020    AST 66 (H) 09/27/2020    ALT 69 (H) 09/27/2020    ANIONGAP 13 03/20/2019     Last hemoglobin A1c  Lab Results   Component Value Date    HGBA1C 7.5 (A) 12/05/2020          Objective      BP 126/74   Pulse 83   Temp 98.3 F (36.8 C)   Resp 16   Ht 5'  7" (1.702 m)   BMI 38.06 kg/m        Physical Exam  Constitutional:       General: He is not in acute distress.     Appearance: He is well-developed.   HENT:      Head: Normocephalic and atraumatic.      Right Ear: Hearing  normal.      Left Ear: Hearing normal.      Nose: Nose normal.   Eyes:  General: Lids are normal. No scleral icterus.        Right eye: No discharge.         Left eye: No discharge.      Conjunctiva/sclera: Conjunctivae normal.   Cardiovascular:      Rate and Rhythm: Normal rate and regular rhythm.      Heart sounds: Normal heart sounds.   Pulmonary:      Effort: Pulmonary effort is normal. No respiratory distress.      Breath sounds: Normal breath sounds.   Musculoskeletal:         General: Normal range of motion.      Cervical back: Neck supple.   Skin:     Findings: No lesion or rash.   Neurological:      Mental Status: He is alert and oriented to person, place, and time.   Psychiatric:         Speech: Speech normal.         Behavior: Behavior normal.         Thought Content: Thought content normal.          Results for orders placed or performed in visit on 12/05/20   POCT glycosylated hemoglobin (Hb A1C)   Result Value Ref Range    Hemoglobin A1C 7.5 (A) 4.0 - 5.6 %    HbA1c POC (<> result, manual entry)      HbA1c, POC (prediabetic range)      HbA1c, POC (controlled diabetic range)        Assessment & Plan        1. Type 2 diabetes mellitus with hyperosmolarity without coma, without  long-term current use of insulin (HCC)  Has changed diet a great deal and FBS much better (123-176). Hgb A1C was  9.0 on 09-27-20 and now 7.5 today. Will continue Trulicity 0.75 mg q week  and Metformin 1000 ng BID. Will continue diet changes and present  medication. If no further improvement in 4-6 weeks., may need to consider  increase of Trulicity to 1.5 mg q week.  - POCT glycosylated hemoglobin (Hb A1C)      No follow-ups on file.         I, Xochil Shanker, PA-C, have reviewed all documentation for this visit.  The documentation on 12/05/20 for the exam, diagnosis, procedures, and  orders are all accurate and complete.        Dortha Kernennis Cielle Aguila, PA-C   [facility]  [phone] (phone)  [phone] (fax)    [facility]

## 2020-12-09 ENCOUNTER — Telehealth: Payer: Self-pay

## 2020-12-09 NOTE — Telephone Encounter (Signed)
Please advise. Thanks.  

## 2020-12-09 NOTE — Telephone Encounter (Signed)
This should have been covered by Surgeon/Anesthesia. He should have been holding Aspirin and any NSAIDs for at least 5 days before surgery. They often hold ARB (Losartan) on morning of surgery as well. He needs to call them to discuss.

## 2020-12-09 NOTE — Telephone Encounter (Signed)
Patient reports that he called and talked to the surgeon and this was taken care of.

## 2020-12-09 NOTE — Telephone Encounter (Signed)
Copied from CRM [id number]. Topic: General - Inquiry >> Dec 09, 2020  1:01 PM Daphine Deutscher D wrote: Reason for CRM: Pt is scheduled for back surgery tomorrow.  Dr. Noralyn Pick at [facility].  Pt is askig should he go off any of his meds before surgery tomorrow.  CB# [phone]

## 2020-12-11 ENCOUNTER — Telehealth: Payer: Self-pay | Admitting: Family Medicine

## 2020-12-11 DIAGNOSIS — M5416 Radiculopathy, lumbar region: Secondary | ICD-10-CM

## 2020-12-11 DIAGNOSIS — M5137 Other intervertebral disc degeneration, lumbosacral region: Secondary | ICD-10-CM

## 2020-12-11 NOTE — Telephone Encounter (Signed)
Patient states [facility] was supposed to do surgery but they cant he states doctor told him that he would feel more comfortable going somewhere like [facility].  Pt would like a referral where ever you suggest that would get him in the fastest   Please advise

## 2020-12-12 NOTE — Telephone Encounter (Signed)
Ok to place referral.

## 2020-12-13 ENCOUNTER — Other Ambulatory Visit: Payer: Self-pay | Admitting: Cardiovascular Disease

## 2020-12-13 DIAGNOSIS — I25118 Atherosclerotic heart disease of native coronary artery with other forms of angina pectoris: Secondary | ICD-10-CM

## 2020-12-13 DIAGNOSIS — I10 Essential (primary) hypertension: Secondary | ICD-10-CM

## 2020-12-13 NOTE — Telephone Encounter (Signed)
Okay to place referral for [facility] or [facility]

## 2020-12-13 NOTE — Telephone Encounter (Signed)
Order for referral placed.

## 2020-12-17 ENCOUNTER — Telehealth: Payer: Self-pay

## 2020-12-17 NOTE — Telephone Encounter (Signed)
Copied from CRM [id number]. Topic: General - Inquiry >> Dec 17, 2020 11:11 AM Traci Sermon wrote: Reason for CRM: Pt called in returning a call from Sarah C. I reached out to the office and was unable to reach her, pt requested  call back. Please advise.

## 2020-12-18 ENCOUNTER — Other Ambulatory Visit: Payer: Self-pay | Admitting: Cardiovascular Disease

## 2020-12-20 ENCOUNTER — Other Ambulatory Visit: Payer: Self-pay | Admitting: Cardiovascular Disease

## 2020-12-23 ENCOUNTER — Other Ambulatory Visit: Payer: Self-pay

## 2020-12-23 MED ORDER — FREESTYLE LANCETS MISC: 4 refills | Status: AC

## 2020-12-23 MED ORDER — FREESTYLE LITE TEST VI STRP: ORAL_STRIP | 4 refills | Status: DC

## 2020-12-25 NOTE — Patient Instructions (Signed)
Visit Information It was great speaking with you today!  Please let me know if you have any questions about our visit.   Goals Addressed             This Visit's Progress    Monitor and Manage My Blood Sugar-Diabetes Type 2   On track    Timeframe:  Long-Range Goal Priority:  High Start Date:  07/29/2020                           Expected End Date:  01/26/2021                     Follow Up Date 12/03/2020    - check blood sugar at prescribed times - check blood sugar if I feel it is too high or too low    Why is this important?   Checking your blood sugar at home helps to keep it from getting very high or very low.  Writing the results in a diary or log helps the doctor know how to care for you.  Your blood sugar log should have the time, date and the results.  Also, write down the amount of insulin or other medicine that you take.  Other information, like what you ate, exercise done and how you were feeling, will also be helpful.     Notes:          Patient Care Plan: General Pharmacy (Adult)     Problem Identified: Hypertension, Hyperlipidemia, Diabetes, Heart Failure, Coronary Artery Disease, GERD and Gout   Priority: High     Long-Range Goal: Patient-Specific Goal   Start Date: 07/29/2020  Expected End Date: 01/27/2021  This Visit's Progress: On track  Recent Progress: On track  Priority: High  Note:   Current Barriers:  Unable to independently afford treatment regimen  Pharmacist Clinical Goal(s):  Over the next 90 days, patient will verbalize ability to afford treatment regimen through collaboration with PharmD and provider.   Interventions: 1:1 collaboration with Erasmo Downer, MD regarding development and update of comprehensive plan of care as evidenced by provider attestation and co-signature Inter-disciplinary care team collaboration (see longitudinal plan of care) Comprehensive medication review performed; medication list updated in electronic  medical record  Hyperlipidemia: (LDL goal < 70) -controlled -Current treatment: Atorvastatin 40 mg daily Zetia 10 mg daily  -Medications previously tried: NA  -Educated on Importance of limiting foods high in cholesterol; -Recommended to continue current medication  Diabetes (A1c goal <7%) -Uncontrolled -Current medications: Metformin ER 500 mg twice daily  Trulicity 0.75 mg weekly (Started 11/11/20) -Medications previously tried: NA   -Current home glucose readings fasting glucose: 121, 153.   post prandial glucose: NA -Reports hyperglycemic symptoms: dry mouth, fatigue  -Patient able to get Trulicity approved through [employer] and reports it is affordable.  -tolerating Trulicity well, has noticed some lessened appetite. Patient prefers to wait until a1c check prior to increasing Trulicity.  Continue current medications   Heart Failure (Goal: control symptoms and prevent exacerbations) Controlled Type: Diastolic -NYHA Class: II (slight limitation of activity) -Ejection fraction: NA (Date: NA) -Current treatment: Amlodipine 5 mg twice daily  Carvedilol 25 mg twice daily  Imdur 60 mg daily  Losartan 100 mg daily  Torsemide 20 mg 2 tablets twice daily -Medications previously tried: NA -Educated on Importance of weighing daily; if you gain more than 3 pounds in one day or 5 pounds in one week,  contact provider's office Proper diuretic administration and potassium supplementation -Continue current medications    Chronic Pain (Goal: Reduce pain and improve quality of life) -Controlled -Current treatment  Cyclobenzaprine 10 mg three times daily as needed - Muscle Spasms (takes most days)  Indomethacin 50 mg three times daily  Naproxen 220 mg 2 tablets twice daily as needed Pregabalin 150 mg twice daily  Meloxicam 7.5 mg daily  -Medications previously tried: NA  Continue current medications   Gout (Goal: Prevent gout flares) -Controlled -Current treatment   Allopurinol 300 mg daily -Last gout flare: >1 month prior -Medications previously tried: NA  Continue current medications  GERD (Goal: prevent symptoms of heartburn and reflux) -controlled -Current treatment  Pantoprazole 40 mg twice daily  -Medications previously tried: NA  -Continue current medications  Patient Goals/Self-Care Activities Over the next 90 days, patient will:  - check glucose daily, document, and provide at future appointments check blood pressure if feeling symptoms of low blood pressure, document, and provide at future appointments  Follow Up Plan: Patient is not eligible for CCM services. Please reconsult if additional medication assistance needed.       Patient agreed to services and verbal consent obtained.   The patient verbalized understanding of instructions, educational materials, and care plan provided today and declined offer to receive copy of patient instructions, educational materials, and care plan.   Angelena Sole, PharmD, CPP Clinical Pharmacist [facility]  [phone]

## 2020-12-27 ENCOUNTER — Encounter: Payer: Self-pay | Admitting: Family Medicine

## 2020-12-27 ENCOUNTER — Other Ambulatory Visit: Payer: Self-pay

## 2020-12-27 ENCOUNTER — Ambulatory Visit (INDEPENDENT_AMBULATORY_CARE_PROVIDER_SITE_OTHER): Admitting: Family Medicine

## 2020-12-27 VITALS — BP 137/86 | HR 75 | Resp 16 | Ht 67.0 in | Wt 232.0 lb

## 2020-12-27 DIAGNOSIS — K2100 Gastro-esophageal reflux disease with esophagitis, without bleeding: Secondary | ICD-10-CM

## 2020-12-27 DIAGNOSIS — E785 Hyperlipidemia, unspecified: Secondary | ICD-10-CM

## 2020-12-27 DIAGNOSIS — E1159 Type 2 diabetes mellitus with other circulatory complications: Secondary | ICD-10-CM | POA: Diagnosis not present

## 2020-12-27 DIAGNOSIS — E1169 Type 2 diabetes mellitus with other specified complication: Secondary | ICD-10-CM

## 2020-12-27 DIAGNOSIS — E1151 Type 2 diabetes mellitus with diabetic peripheral angiopathy without gangrene: Secondary | ICD-10-CM

## 2020-12-27 DIAGNOSIS — I152 Hypertension secondary to endocrine disorders: Secondary | ICD-10-CM

## 2020-12-27 DIAGNOSIS — R7401 Elevation of levels of liver transaminase levels: Secondary | ICD-10-CM

## 2020-12-27 HISTORY — DX: Type 2 diabetes mellitus with other specified complication: E11.69

## 2020-12-27 MED ORDER — MELOXICAM 7.5 MG PO TABS: 7.5000 mg | ORAL_TABLET | Freq: Every day | ORAL | 5 refills | Status: DC

## 2020-12-27 MED ORDER — TRULICITY 1.5 MG/0.5ML ~~LOC~~ SOAJ: 1.5000 mg | SUBCUTANEOUS | 1 refills | Status: DC

## 2020-12-27 NOTE — Assessment & Plan Note (Signed)
Well controlled Continue current medications Recheck metabolic panel F/u in 6 months  

## 2020-12-27 NOTE — Assessment & Plan Note (Signed)
Taking 3 NSAIDs currently Discussed risks of concurrent NSAIDs with GERD, CAD Will stop at least 2 of them now Try to avoid all of them in the future

## 2020-12-27 NOTE — Progress Notes (Signed)
Established patient visit   Patient: Jonathon Newman   DOB: 07-15-1955   65 y.o. Male  MRN: [id number] Visit Date: 12/27/2020  Today's healthcare provider: Shirlee Latch, MD   Chief Complaint  Patient presents with   Follow-up   Subjective    HPI   Diabetes Mellitus Type II, follow-up  Lab Results  Component Value Date   HGBA1C 7.5 (A) 12/05/2020   HGBA1C 9.0 (H) 09/27/2020   HGBA1C 7.7 (A) 09/27/2020   Last seen for diabetes 3 weeks ago.  Management since then includes continuing the same treatment. He reports  compliance with treatment. He is amenable to a higher dosage of .75 mg Trulicity  He  having side effects.   Home blood sugar records:  He reports a few days ago he dropped down to 82 and he felt sluggish and fatigue.   Episodes of hypoglycemia?     Most Recent Eye Exam: 1-2 years ago  --------------------------------------------------------------------------------------------------- Hypertension, follow-up  BP Readings from Last 3 Encounters:  12/27/20 137/86  12/05/20 126/74  11/04/20 135/77   Wt Readings from Last 3 Encounters:  12/27/20 232 lb (105.2 kg)  11/04/20 243 lb (110.2 kg)  11/01/20 240 lb 6 oz (109 kg)     He was last seen for hypertension 1  ago.  Management since that visit includes no change. He reports  compliance with treatment. He  having side effects.  He  exercising.   --------------------------------------------------------------------------------------------------- Lipid/Cholesterol, follow-up  Last Lipid Panel: Lab Results  Component Value Date   CHOL 209 (H) 01/22/2020   LDLCALC 93 01/22/2020   HDL 36 (L) 01/22/2020   TRIG 481 (H) 01/22/2020    He was last seen for this 1  ago.  Management since that visit includes no change  He reports  compliance with treatment. He  having side effects.   Symptoms:  appetite changes  foot ulcerations   chest pain  chest pressure/discomfort   dyspnea  orthopnea    fatigue  lower extremity edema   palpitations  paroxysmal nocturnal dyspnea   nausea  numbness or tingling of extremity   polydipsia  polyuria   speech difficulty  syncope     Last metabolic panel Lab Results  Component Value Date   GLUCOSE 234 (H) 09/27/2020   NA 135 09/27/2020   K 4.7 09/27/2020   BUN 23 09/27/2020   CREATININE 1.16 09/27/2020   GFRNONAA 63 06/28/2020   GFRAA 72 06/28/2020   CALCIUM 10.8 (H) 09/27/2020   AST 66 (H) 09/27/2020   ALT 69 (H) 09/27/2020   The 10-year ASCVD risk score Denman George DC Jr., et al., 2013) is: 44.8%  He is currently taking 7.5 mg meloxicam for his joint and back pain.  ---------------------------------------------------------------------------------------------------     Medications: Outpatient Medications Prior to Visit  Medication Sig   allopurinol (ZYLOPRIM) 300 MG tablet Take 1 tablet (300 mg total) by mouth daily.   amLODipine (NORVASC) 5 MG tablet Take 1 tablet (5 mg total) by mouth 2 (two) times daily.   aspirin 81 MG tablet Take 81 mg by mouth daily.   atorvastatin (LIPITOR) 40 MG tablet Take 1 tablet (40 mg total) by mouth daily.   Blood Glucose Monitoring Suppl (FREESTYLE LITE) DEVI To check blood sugar once daily   carvedilol (COREG) 25 MG tablet TAKE ONE TABLET BY MOUTH TWICE A DAY WITH MEALS   cyclobenzaprine (FLEXERIL) 10 MG tablet TAKE ONE TABLET BY MOUTH THREE TIMES A DAY AS NEEDED FOR  MUSCLE SPASMS   ezetimibe (ZETIA) 10 MG tablet TAKE ONE TABLET BY MOUTH DAILY   glucose blood (FREESTYLE LITE) test strip USE TO CHECK BLOOD SUGAR ONCE DAILY   isosorbide mononitrate (IMDUR) 60 MG 24 hr tablet Take 1 tablet (60 mg total) by mouth daily.   Lancets (FREESTYLE) lancets USE TO CHECK BLOOD SUGAR ONCE DAILY   losartan (COZAAR) 100 MG tablet TAKE ONE TABLET BY MOUTH DAILY . NEED TO SCHEDULE AN OFFICE VISIT AND LAB WORK   metFORMIN (GLUCOPHAGE-XR) 500 MG 24 hr tablet Take 2 tablets (1,000 mg total) by mouth 2 (two) times daily  with a meal.   Multiple Vitamin (MULTIVITAMIN WITH MINERALS) TABS tablet Take 1 tablet by mouth daily.   nitroGLYCERIN (NITROSTAT) 0.4 MG SL tablet Place 1 tablet (0.4 mg total) under the tongue every 5 (five) minutes as needed for chest pain.   pantoprazole (PROTONIX) 40 MG tablet Take 1 tablet (40 mg total) by mouth 2 (two) times daily before a meal.   potassium chloride (KLOR-CON) 10 MEQ tablet TAKE ONE TABLET BY MOUTH TWICE A DAY AS NEEDED   pregabalin (LYRICA) 150 MG capsule Take 1 capsule (150 mg total) by mouth 2 (two) times daily.   senna (SENOKOT) 8.6 MG TABS tablet Take 1 tablet by mouth daily.   sucralfate (CARAFATE) 1 GM/10ML suspension Take 10 mLs (1 g total) by mouth 4 (four) times daily -  with meals and at bedtime.   torsemide (DEMADEX) 20 MG tablet TAKE TWO TABLETS BY MOUTH TWICE A DAY   [DISCONTINUED] Dulaglutide (TRULICITY) 0.75 MG/0.5ML SOPN Inject 0.75 mg into the skin once a week.   [DISCONTINUED] indomethacin (INDOCIN) 50 MG capsule TAKE ONE CAPSULE BY MOUTH THREE TIMES A DAY WITH MEALS   [DISCONTINUED] meloxicam (MOBIC) 7.5 MG tablet Take 7.5 mg by mouth daily.   [DISCONTINUED] naproxen sodium (ALEVE) 220 MG tablet Take 440 mg by mouth 2 (two) times daily as needed (mild pain).   No facility-administered medications prior to visit.   Review of Systems  Constitutional:  Negative for appetite change, chills, fatigue and fever.  HENT:  Negative for ear pain, sinus pressure, sinus pain and sore throat.   Eyes:  Negative for pain and visual disturbance.  Respiratory:  Negative for cough, chest tightness, shortness of breath and wheezing.   Cardiovascular:  Negative for chest pain, palpitations and leg swelling.  Gastrointestinal:  Negative for abdominal pain, blood in stool, diarrhea, nausea and vomiting.  Genitourinary:  Negative for flank pain, frequency and urgency.  Musculoskeletal:  Negative for back pain, myalgias and neck pain.  Neurological:  Negative for  dizziness, weakness, light-headedness, numbness and headaches.      Objective    BP 137/86 (BP Location: Left Arm, Patient Position: Sitting, Cuff Size: Large)   Pulse 75   Resp 16   Ht 5\' 7"  (1.702 m)   Wt 232 lb (105.2 kg)   SpO2 96%   BMI 36.34 kg/m     Physical Exam Vitals reviewed.  Constitutional:      General: He is not in acute distress.    Appearance: Normal appearance. He is not diaphoretic.  HENT:     Head: Normocephalic and atraumatic.  Eyes:     General: No scleral icterus.    Conjunctiva/sclera: Conjunctivae normal.  Cardiovascular:     Rate and Rhythm: Normal rate and regular rhythm.     Pulses: Normal pulses.     Heart sounds: Normal heart sounds. No murmur heard. Pulmonary:  Effort: Pulmonary effort is normal. No respiratory distress.     Breath sounds: Normal breath sounds. No wheezing or rhonchi.  Abdominal:     General: There is no distension.     Palpations: Abdomen is soft.     Tenderness: There is no abdominal tenderness.  Musculoskeletal:     Cervical back: Neck supple.     Right lower leg: No edema.     Left lower leg: No edema.  Lymphadenopathy:     Cervical: No cervical adenopathy.  Skin:    General: Skin is warm and dry.     Capillary Refill: Capillary refill takes less than 2 seconds.     Findings: No rash.  Neurological:     Mental Status: He is alert and oriented to person, place, and time.     Cranial Nerves: No cranial nerve deficit.  Psychiatric:        Mood and Affect: Mood normal.        Behavior: Behavior normal.    No results found for any visits on 12/27/20.  Assessment & Plan     Problem List Items Addressed This Visit       Cardiovascular and Mediastinum   Hypertension associated with type 2 diabetes mellitus (HCC)    Well controlled Continue current medications Recheck metabolic panel F/u in 6 months        Relevant Medications   Dulaglutide (TRULICITY) 1.5 MG/0.5ML SOPN   Other Relevant Orders    Comprehensive metabolic panel     Digestive   Gastroesophageal reflux disease with esophagitis    Taking 3 NSAIDs currently Discussed risks of concurrent NSAIDs with GERD, CAD Will stop at least 2 of them now Try to avoid all of them in the future         Endocrine   T2DM (type 2 diabetes mellitus) (HCC) - Primary    Uncontrolled with hyperglycemia Recent a1c 7.5 Continue metformin Increase trulicity to 1.5 mg weekly Advised need for eye exam Foot exam today On statin and ARB F/u in 3 m and repeat a1c       Relevant Medications   Dulaglutide (TRULICITY) 1.5 MG/0.5ML SOPN   Hyperlipidemia associated with type 2 diabetes mellitus (HCC)    Previously well controlled Continue statin and zetia Repeat FLP and CMP Goal LDL < 70       Relevant Medications   Dulaglutide (TRULICITY) 1.5 MG/0.5ML SOPN   Other Relevant Orders   Comprehensive metabolic panel   Lipid panel     Other   Morbid obesity (HCC)    BMI 36 andassociated with HTN and HLD and T2DM Discussed importance of healthy weight management Discussed diet and exercise        Relevant Medications   Dulaglutide (TRULICITY) 1.5 MG/0.5ML SOPN   Elevated transaminase level   Relevant Orders   Comprehensive metabolic panel   Other Visit Diagnoses     Hypercalcemia       Relevant Orders   Comprehensive metabolic panel       Return in about 3 months (around 03/29/2021) for chronic disease f/u, With new PCP.      I,Essence Turner,acting as a Neurosurgeon for Shirlee Latch, MD.,have documented all relevant documentation on the behalf of Shirlee Latch, MD,as directed by  Shirlee Latch, MD while in the presence of Shirlee Latch, MD.  I, Shirlee Latch, MD, have reviewed all documentation for this visit. The documentation on 12/27/20 for the exam, diagnosis, procedures, and orders are all accurate and complete.  Xianna Siverling, Marzella SchleinAngela M, MD, MPH [facility]

## 2020-12-27 NOTE — Patient Instructions (Signed)
Diabetes Mellitus and Nutrition, Adult When you have diabetes, or diabetes mellitus, it is very important to have healthy eating habits because your blood sugar (glucose) levels are greatly affected by what you eat and drink. Eating healthy foods in the right amounts, at about the same times every day, can help you:  Control your blood glucose.  Lower your risk of heart disease.  Improve your blood pressure.  Reach or maintain a healthy weight. What can affect my meal plan? Every person with diabetes is different, and each person has different needs for a meal plan. Your health care provider may recommend that you work with a dietitian to make a meal plan that is best for you. Your meal plan may vary depending on factors such as:  The calories you need.  The medicines you take.  Your weight.  Your blood glucose, blood pressure, and cholesterol levels.  Your activity level.  Other health conditions you have, such as heart or kidney disease. How do carbohydrates affect me? Carbohydrates, also called carbs, affect your blood glucose level more than any other type of food. Eating carbs naturally raises the amount of glucose in your blood. Carb counting is a method for keeping track of how many carbs you eat. Counting carbs is important to keep your blood glucose at a healthy level, especially if you use insulin or take certain oral diabetes medicines. It is important to know how many carbs you can safely have in each meal. This is different for every person. Your dietitian can help you calculate how many carbs you should have at each meal and for each snack. How does alcohol affect me? Alcohol can cause a sudden decrease in blood glucose (hypoglycemia), especially if you use insulin or take certain oral diabetes medicines. Hypoglycemia can be a life-threatening condition. Symptoms of hypoglycemia, such as sleepiness, dizziness, and confusion, are similar to symptoms of having too much  alcohol.  Do not drink alcohol if: ? Your health care provider tells you not to drink. ? You are pregnant, may be pregnant, or are planning to become pregnant.  If you drink alcohol: ? Do not drink on an empty stomach. ? Limit how much you use to:  0-1 drink a day for women.  0-2 drinks a day for men. ? Be aware of how much alcohol is in your drink. In the [place] one drink equals one 12 oz bottle of beer (355 mL), one 5 oz glass of wine (148 mL), or one 1 oz glass of hard liquor (44 mL). ? Keep yourself hydrated with water, diet soda, or unsweetened iced tea.  Keep in mind that regular soda, juice, and other mixers may contain a lot of sugar and must be counted as carbs. What are tips for following this plan? Reading food labels  Start by checking the serving size on the "Nutrition Facts" label of packaged foods and drinks. The amount of calories, carbs, fats, and other nutrients listed on the label is based on one serving of the item. Many items contain more than one serving per package.  Check the total grams (g) of carbs in one serving. You can calculate the number of servings of carbs in one serving by dividing the total carbs by 15. For example, if a food has 30 g of total carbs per serving, it would be equal to 2 servings of carbs.  Check the number of grams (g) of saturated fats and trans fats in one serving. Choose foods that have   a low amount or none of these fats.  Check the number of milligrams (mg) of salt (sodium) in one serving. Most people should limit total sodium intake to less than 2,300 mg per day.  Always check the nutrition information of foods labeled as "low-fat" or "nonfat." These foods may be higher in added sugar or refined carbs and should be avoided.  Talk to your dietitian to identify your daily goals for nutrients listed on the label. Shopping  Avoid buying canned, pre-made, or processed foods. These foods tend to be high in fat, sodium, and added  sugar.  Shop around the outside edge of the grocery store. This is where you will most often find fresh fruits and vegetables, bulk grains, fresh meats, and fresh dairy. Cooking  Use low-heat cooking methods, such as baking, instead of high-heat cooking methods like deep frying.  Cook using healthy oils, such as olive, canola, or sunflower oil.  Avoid cooking with butter, cream, or high-fat meats. Meal planning  Eat meals and snacks regularly, preferably at the same times every day. Avoid going long periods of time without eating.  Eat foods that are high in fiber, such as fresh fruits, vegetables, beans, and whole grains. Talk with your dietitian about how many servings of carbs you can eat at each meal.  Eat 4-6 oz (112-168 g) of lean protein each day, such as lean meat, chicken, fish, eggs, or tofu. One ounce (oz) of lean protein is equal to: ? 1 oz (28 g) of meat, chicken, or fish. ? 1 egg. ?  cup (62 g) of tofu.  Eat some foods each day that contain healthy fats, such as avocado, nuts, seeds, and fish.   What foods should I eat? Fruits Berries. Apples. Oranges. Peaches. Apricots. Plums. Grapes. Mango. Papaya. Pomegranate. Kiwi. Cherries. Vegetables Lettuce. Spinach. Leafy greens, including kale, chard, collard greens, and mustard greens. Beets. Cauliflower. Cabbage. Broccoli. Carrots. Green beans. Tomatoes. Peppers. Onions. Cucumbers. Brussels sprouts. Grains Whole grains, such as whole-wheat or whole-grain bread, crackers, tortillas, cereal, and pasta. Unsweetened oatmeal. Quinoa. Brown or wild rice. Meats and other proteins Seafood. Poultry without skin. Lean cuts of poultry and beef. Tofu. Nuts. Seeds. Dairy Low-fat or fat-free dairy products such as milk, yogurt, and cheese. The items listed above may not be a complete list of foods and beverages you can eat. Contact a dietitian for more information. What foods should I avoid? Fruits Fruits canned with  syrup. Vegetables Canned vegetables. Frozen vegetables with butter or cream sauce. Grains Refined white flour and flour products such as bread, pasta, snack foods, and cereals. Avoid all processed foods. Meats and other proteins Fatty cuts of meat. Poultry with skin. Breaded or fried meats. Processed meat. Avoid saturated fats. Dairy Full-fat yogurt, cheese, or milk. Beverages Sweetened drinks, such as soda or iced tea. The items listed above may not be a complete list of foods and beverages you should avoid. Contact a dietitian for more information. Questions to ask a health care provider  Do I need to meet with a diabetes educator?  Do I need to meet with a dietitian?  What number can I call if I have questions?  When are the best times to check my blood glucose? Where to find more information:  American Diabetes Association: diabetes.org  Academy of Nutrition and Dietetics: www.eatright.org  National Institute of Diabetes and Digestive and Kidney Diseases: www.niddk.nih.gov  Association of Diabetes Care and Education Specialists: www.diabeteseducator.org Summary  It is important to have healthy eating   habits because your blood sugar (glucose) levels are greatly affected by what you eat and drink.  A healthy meal plan will help you control your blood glucose and maintain a healthy lifestyle.  Your health care provider may recommend that you work with a dietitian to make a meal plan that is best for you.  Keep in mind that carbohydrates (carbs) and alcohol have immediate effects on your blood glucose levels. It is important to count carbs and to use alcohol carefully. This information is not intended to replace advice given to you by your health care provider. Make sure you discuss any questions you have with your health care provider. Document Revised: 05/09/2019 Document Reviewed: 05/09/2019 Elsevier Patient Education  2021 Elsevier Inc.  

## 2020-12-27 NOTE — Assessment & Plan Note (Signed)
BMI 36 andassociated with HTN and HLD and T2DM Discussed importance of healthy weight management Discussed diet and exercise

## 2020-12-27 NOTE — Assessment & Plan Note (Signed)
Previously well controlled Continue statin and zetia Repeat FLP and CMP Goal LDL < 70 

## 2020-12-27 NOTE — Assessment & Plan Note (Signed)
Uncontrolled with hyperglycemia Recent a1c 7.5 Continue metformin Increase trulicity to 1.5 mg weekly Advised need for eye exam Foot exam today On statin and ARB F/u in 3 m and repeat a1c

## 2020-12-28 LAB — COMPREHENSIVE METABOLIC PANEL
ALT: 110 IU/L — ABNORMAL HIGH (ref 0–44)
AST: 64 IU/L — ABNORMAL HIGH (ref 0–40)
Albumin/Globulin Ratio: 2 (ref 1.2–2.2)
Albumin: 5.1 g/dL — ABNORMAL HIGH (ref 3.8–4.8)
Alkaline Phosphatase: 85 IU/L (ref 44–121)
BUN/Creatinine Ratio: 20 (ref 10–24)
BUN: 31 mg/dL — ABNORMAL HIGH (ref 8–27)
Bilirubin Total: 0.4 mg/dL (ref 0.0–1.2)
CO2: 20 mmol/L (ref 20–29)
Calcium: 10.8 mg/dL — ABNORMAL HIGH (ref 8.6–10.2)
Chloride: 97 mmol/L (ref 96–106)
Creatinine, Ser: 1.53 mg/dL — ABNORMAL HIGH (ref 0.76–1.27)
Globulin, Total: 2.6 g/dL (ref 1.5–4.5)
Glucose: 112 mg/dL — ABNORMAL HIGH (ref 65–99)
Potassium: 4.4 mmol/L (ref 3.5–5.2)
Sodium: 139 mmol/L (ref 134–144)
Total Protein: 7.7 g/dL (ref 6.0–8.5)
eGFR: 50 mL/min/{1.73_m2} — ABNORMAL LOW (ref >59)

## 2020-12-28 LAB — LIPID PANEL
Chol/HDL Ratio: 5.8 ratio — ABNORMAL HIGH (ref 0.0–5.0)
Cholesterol, Total: 146 mg/dL (ref 100–199)
HDL: 25 mg/dL — ABNORMAL LOW (ref >39)
LDL Chol Calc (NIH): 22 mg/dL (ref 0–99)
Triglycerides: 775 mg/dL (ref 0–149)
VLDL Cholesterol Cal: 99 mg/dL — ABNORMAL HIGH (ref 5–40)

## 2020-12-31 ENCOUNTER — Other Ambulatory Visit (HOSPITAL_COMMUNITY): Payer: Self-pay | Admitting: Physician Assistant

## 2020-12-31 ENCOUNTER — Other Ambulatory Visit: Payer: Self-pay | Admitting: Physician Assistant

## 2020-12-31 DIAGNOSIS — Z981 Arthrodesis status: Secondary | ICD-10-CM

## 2020-12-31 DIAGNOSIS — R270 Ataxia, unspecified: Secondary | ICD-10-CM

## 2020-12-31 DIAGNOSIS — M21372 Foot drop, left foot: Secondary | ICD-10-CM

## 2020-12-31 DIAGNOSIS — R2689 Other abnormalities of gait and mobility: Secondary | ICD-10-CM

## 2021-01-03 DIAGNOSIS — I152 Hypertension secondary to endocrine disorders: Secondary | ICD-10-CM

## 2021-01-03 DIAGNOSIS — E1159 Type 2 diabetes mellitus with other circulatory complications: Secondary | ICD-10-CM

## 2021-01-05 ENCOUNTER — Ambulatory Visit
Admission: RE | Admit: 2021-01-05 | Discharge: 2021-01-05 | Disposition: A | Discharge status: Home / Self Care | Source: Ambulatory Visit | Attending: Physician Assistant | Admitting: Physician Assistant

## 2021-01-05 ENCOUNTER — Other Ambulatory Visit: Payer: Self-pay

## 2021-01-05 DIAGNOSIS — M21372 Foot drop, left foot: Secondary | ICD-10-CM

## 2021-01-05 DIAGNOSIS — R270 Ataxia, unspecified: Secondary | ICD-10-CM | POA: Insufficient documentation

## 2021-01-05 DIAGNOSIS — Z981 Arthrodesis status: Secondary | ICD-10-CM

## 2021-01-05 DIAGNOSIS — R2689 Other abnormalities of gait and mobility: Secondary | ICD-10-CM | POA: Insufficient documentation

## 2021-01-09 ENCOUNTER — Encounter: Payer: Self-pay | Admitting: Family Medicine

## 2021-01-12 ENCOUNTER — Other Ambulatory Visit: Payer: Self-pay | Admitting: Physician Assistant

## 2021-01-12 DIAGNOSIS — M1009 Idiopathic gout, multiple sites: Secondary | ICD-10-CM

## 2021-01-17 ENCOUNTER — Other Ambulatory Visit: Payer: Self-pay | Admitting: Family Medicine

## 2021-01-17 ENCOUNTER — Telehealth: Payer: Self-pay

## 2021-01-17 DIAGNOSIS — I10 Essential (primary) hypertension: Secondary | ICD-10-CM

## 2021-01-17 DIAGNOSIS — I25118 Atherosclerotic heart disease of native coronary artery with other forms of angina pectoris: Secondary | ICD-10-CM

## 2021-01-17 DIAGNOSIS — E1151 Type 2 diabetes mellitus with diabetic peripheral angiopathy without gangrene: Secondary | ICD-10-CM

## 2021-01-17 MED ORDER — LOSARTAN POTASSIUM 100 MG PO TABS: 100.0000 mg | ORAL_TABLET | Freq: Every day | ORAL | 0 refills | Status: DC

## 2021-01-17 NOTE — Telephone Encounter (Signed)
[facility] faxed refill request for the following medications:  losartan (COZAAR) 100 MG tablet    Please advise.  

## 2021-01-24 ENCOUNTER — Other Ambulatory Visit: Payer: Self-pay | Admitting: Physician Assistant

## 2021-01-24 DIAGNOSIS — Z981 Arthrodesis status: Secondary | ICD-10-CM

## 2021-01-24 DIAGNOSIS — R270 Ataxia, unspecified: Secondary | ICD-10-CM

## 2021-01-24 DIAGNOSIS — M21372 Foot drop, left foot: Secondary | ICD-10-CM

## 2021-01-24 DIAGNOSIS — R2689 Other abnormalities of gait and mobility: Secondary | ICD-10-CM

## 2021-02-02 ENCOUNTER — Ambulatory Visit
Admission: RE | Admit: 2021-02-02 | Discharge: 2021-02-02 | Disposition: A | Discharge status: Home / Self Care | Source: Ambulatory Visit | Attending: Physician Assistant | Admitting: Physician Assistant

## 2021-02-02 ENCOUNTER — Other Ambulatory Visit: Payer: Self-pay

## 2021-02-02 DIAGNOSIS — M21372 Foot drop, left foot: Secondary | ICD-10-CM | POA: Insufficient documentation

## 2021-02-02 DIAGNOSIS — R270 Ataxia, unspecified: Secondary | ICD-10-CM | POA: Insufficient documentation

## 2021-02-02 DIAGNOSIS — R2689 Other abnormalities of gait and mobility: Secondary | ICD-10-CM | POA: Diagnosis present

## 2021-02-02 DIAGNOSIS — Z981 Arthrodesis status: Secondary | ICD-10-CM | POA: Insufficient documentation

## 2021-02-07 ENCOUNTER — Telehealth: Payer: Self-pay

## 2021-02-07 ENCOUNTER — Other Ambulatory Visit: Payer: Self-pay | Admitting: Family Medicine

## 2021-02-07 DIAGNOSIS — M5416 Radiculopathy, lumbar region: Secondary | ICD-10-CM

## 2021-02-07 DIAGNOSIS — M5137 Other intervertebral disc degeneration, lumbosacral region: Secondary | ICD-10-CM

## 2021-02-07 MED ORDER — CYCLOBENZAPRINE HCL 10 MG PO TABS: ORAL_TABLET | ORAL | 1 refills | Status: DC

## 2021-02-07 MED ORDER — MELOXICAM 15 MG PO TABS: 15.0000 mg | ORAL_TABLET | Freq: Every day | ORAL | 0 refills | Status: DC

## 2021-02-07 MED ORDER — DICLOFENAC SODIUM 1 % EX GEL: 4.0000 g | Freq: Four times a day (QID) | CUTANEOUS | 0 refills | Status: DC | PRN

## 2021-02-07 NOTE — Telephone Encounter (Signed)
Copied from CRM [id number]. Topic: General - Other >> Feb 07, 2021 11:14 AM Jaquita Rector A wrote: Reason for CRM: Patient called in asking to speak to Laretta Alstrom about the issues that he is having and not being able to set a date for his back surgery to help with his walking. States that he is walking with a walker and it getting more difficult each day. Please call Ph# [phone]

## 2021-02-11 LAB — COMPREHENSIVE METABOLIC PANEL
ALT: 299 IU/L — ABNORMAL HIGH (ref 0–44)
AST: 183 IU/L — ABNORMAL HIGH (ref 0–40)
Albumin/Globulin Ratio: 1.5 (ref 1.2–2.2)
Albumin: 4.6 g/dL (ref 3.8–4.8)
Alkaline Phosphatase: 178 IU/L — ABNORMAL HIGH (ref 44–121)
BUN/Creatinine Ratio: 22 (ref 10–24)
BUN: 26 mg/dL (ref 8–27)
Bilirubin Total: 0.6 mg/dL (ref 0.0–1.2)
CO2: 19 mmol/L — ABNORMAL LOW (ref 20–29)
Calcium: 10.5 mg/dL — ABNORMAL HIGH (ref 8.6–10.2)
Chloride: 100 mmol/L (ref 96–106)
Creatinine, Ser: 1.18 mg/dL (ref 0.76–1.27)
Globulin, Total: 3 g/dL (ref 1.5–4.5)
Glucose: 160 mg/dL — ABNORMAL HIGH (ref 65–99)
Potassium: 4.5 mmol/L (ref 3.5–5.2)
Sodium: 138 mmol/L (ref 134–144)
Total Protein: 7.6 g/dL (ref 6.0–8.5)
eGFR: 69 mL/min/{1.73_m2} (ref >59)

## 2021-02-13 ENCOUNTER — Emergency Department

## 2021-02-13 ENCOUNTER — Encounter: Payer: Self-pay | Admitting: Intensive Care

## 2021-02-13 ENCOUNTER — Other Ambulatory Visit: Payer: Self-pay

## 2021-02-13 DIAGNOSIS — R531 Weakness: Secondary | ICD-10-CM | POA: Diagnosis not present

## 2021-02-13 DIAGNOSIS — I509 Heart failure, unspecified: Secondary | ICD-10-CM | POA: Insufficient documentation

## 2021-02-13 DIAGNOSIS — E119 Type 2 diabetes mellitus without complications: Secondary | ICD-10-CM | POA: Insufficient documentation

## 2021-02-13 DIAGNOSIS — Z79899 Other long term (current) drug therapy: Secondary | ICD-10-CM | POA: Diagnosis not present

## 2021-02-13 DIAGNOSIS — Z7984 Long term (current) use of oral hypoglycemic drugs: Secondary | ICD-10-CM | POA: Insufficient documentation

## 2021-02-13 DIAGNOSIS — Z7982 Long term (current) use of aspirin: Secondary | ICD-10-CM | POA: Diagnosis not present

## 2021-02-13 DIAGNOSIS — M79661 Pain in right lower leg: Secondary | ICD-10-CM | POA: Insufficient documentation

## 2021-02-13 DIAGNOSIS — I110 Hypertensive heart disease with heart failure: Secondary | ICD-10-CM | POA: Insufficient documentation

## 2021-02-13 DIAGNOSIS — M79662 Pain in left lower leg: Secondary | ICD-10-CM | POA: Insufficient documentation

## 2021-02-13 DIAGNOSIS — R200 Anesthesia of skin: Secondary | ICD-10-CM | POA: Insufficient documentation

## 2021-02-13 DIAGNOSIS — Z87891 Personal history of nicotine dependence: Secondary | ICD-10-CM | POA: Insufficient documentation

## 2021-02-13 DIAGNOSIS — I25729 Atherosclerosis of autologous artery coronary artery bypass graft(s) with unspecified angina pectoris: Secondary | ICD-10-CM | POA: Diagnosis not present

## 2021-02-13 NOTE — ED Provider Notes (Signed)
Emergency Medicine Provider Triage Evaluation Note  Jonathon Newman , a 65 y.o. male  was evaluated in triage.  Pt complains of back pain and L leg weakness. Was going to get surgery but was cancelled, now referred to [facility], apt next Thursday.   Review of Systems  Positive: Leg leg weakness, paresthesias Negative: Fever, chills, bladder incontinence, saddle anesthesia   Physical Exam  There were no vitals taken for this visit. Gen:   Awake, no distress   Resp:  Normal effort  MSK:   LLE 3/5  strength with dorsiflexion, 4/5 plantar flexion, 4/5 hip flexion, sensation grossly intact  Other:    Medical Decision Making  Medically screening exam initiated at 6:13 PM.  Appropriate orders placed.  [name] was informed that the remainder of the evaluation will be completed by another provider, this initial triage assessment does not replace that evaluation, and the importance of remaining in the ED until their evaluation is complete.  MRI to r/o cord compression     Georga Hacking, MD 02/13/21 Rickey Primus

## 2021-02-13 NOTE — ED Triage Notes (Addendum)
Patient c/o weakness that started around 3 1/2 weeks ago. Wife reports he started using a cane, then walker and now since Tuesday cannot walk without falling due to left leg weakness. Hand grips strong and equal. Movement of arms normal. Patient unable to pick up left leg while in wheelchair. A&O x4 in triage. Patient was suppose to get back surgery X3 months ago but reports his doctor cancelled it. PAtient also reports incontinence of stool that started this week

## 2021-02-14 ENCOUNTER — Other Ambulatory Visit: Payer: Self-pay | Admitting: Family Medicine

## 2021-02-14 ENCOUNTER — Emergency Department
Admission: EM | Admit: 2021-02-14 | Discharge: 2021-02-14 | Disposition: A | Discharge status: Home / Self Care | Attending: Emergency Medicine | Admitting: Emergency Medicine

## 2021-02-14 ENCOUNTER — Emergency Department

## 2021-02-14 DIAGNOSIS — M6281 Muscle weakness (generalized): Secondary | ICD-10-CM

## 2021-02-14 DIAGNOSIS — E871 Hypo-osmolality and hyponatremia: Secondary | ICD-10-CM | POA: Insufficient documentation

## 2021-02-14 DIAGNOSIS — E1151 Type 2 diabetes mellitus with diabetic peripheral angiopathy without gangrene: Secondary | ICD-10-CM

## 2021-02-14 LAB — CBC WITH DIFFERENTIAL/PLATELET
Abs Immature Granulocytes: 0.1 10*3/uL — ABNORMAL HIGH (ref 0.00–0.07)
Basophils Absolute: 0.2 10*3/uL — ABNORMAL HIGH (ref 0.0–0.1)
Basophils Relative: 2 %
Eosinophils Absolute: 2.8 10*3/uL — ABNORMAL HIGH (ref 0.0–0.5)
Eosinophils Relative: 25 %
HCT: 41.8 % (ref 39.0–52.0)
Hemoglobin: 15.1 g/dL (ref 13.0–17.0)
Immature Granulocytes: 1 %
Lymphocytes Relative: 21 %
Lymphs Abs: 2.3 10*3/uL (ref 0.7–4.0)
MCH: 32.3 pg (ref 26.0–34.0)
MCHC: 36.1 g/dL — ABNORMAL HIGH (ref 30.0–36.0)
MCV: 89.5 fL (ref 80.0–100.0)
Monocytes Absolute: 1.2 10*3/uL — ABNORMAL HIGH (ref 0.1–1.0)
Monocytes Relative: 11 %
Neutro Abs: 4.5 10*3/uL (ref 1.7–7.7)
Neutrophils Relative %: 40 %
Platelets: 220 10*3/uL (ref 150–400)
RBC: 4.67 MIL/uL (ref 4.22–5.81)
RDW: 15 % (ref 11.5–15.5)
Smear Review: NORMAL
WBC: 11 10*3/uL — ABNORMAL HIGH (ref 4.0–10.5)
nRBC: 0 % (ref 0.0–0.2)

## 2021-02-14 LAB — BASIC METABOLIC PANEL
Anion gap: 12 (ref 5–15)
Anion gap: 11 (ref 5–15)
BUN: 60 mg/dL — ABNORMAL HIGH (ref 8–23)
BUN: 54 mg/dL — ABNORMAL HIGH (ref 8–23)
CO2: 25 mmol/L (ref 22–32)
CO2: 26 mmol/L (ref 22–32)
Calcium: 9.3 mg/dL (ref 8.9–10.3)
Calcium: 8.9 mg/dL (ref 8.9–10.3)
Chloride: 94 mmol/L — ABNORMAL LOW (ref 98–111)
Chloride: 95 mmol/L — ABNORMAL LOW (ref 98–111)
Creatinine, Ser: 1.57 mg/dL — ABNORMAL HIGH (ref 0.61–1.24)
Creatinine, Ser: 1.45 mg/dL — ABNORMAL HIGH (ref 0.61–1.24)
GFR, Estimated: 49 mL/min — ABNORMAL LOW (ref >60)
GFR, Estimated: 54 mL/min — ABNORMAL LOW (ref >60)
Glucose, Bld: 136 mg/dL — ABNORMAL HIGH (ref 70–99)
Glucose, Bld: 129 mg/dL — ABNORMAL HIGH (ref 70–99)
Potassium: 3.8 mmol/L (ref 3.5–5.1)
Potassium: 3.8 mmol/L (ref 3.5–5.1)
Sodium: 131 mmol/L — ABNORMAL LOW (ref 135–145)
Sodium: 132 mmol/L — ABNORMAL LOW (ref 135–145)

## 2021-02-14 LAB — PATHOLOGIST SMEAR REVIEW

## 2021-02-14 MED ORDER — PREDNISONE 50 MG PO TABS: ORAL_TABLET | ORAL | 0 refills | Status: DC

## 2021-02-14 MED ORDER — SODIUM CHLORIDE 0.9 % IV BOLUS
500.0000 mL | Freq: Once | INTRAVENOUS | Status: AC
  Administered 2021-02-14: 500 mL via INTRAVENOUS

## 2021-02-14 MED ORDER — PREDNISONE 20 MG PO TABS
40.0000 mg | ORAL_TABLET | Freq: Once | ORAL | Status: AC
  Administered 2021-02-14: 40 mg via ORAL
  Filled 2021-02-14: qty 2

## 2021-02-14 NOTE — ED Provider Notes (Signed)
[facility] Emergency Department Provider Note   ____________________________________________   Event Date/Time   First MD Initiated Contact with Patient 02/14/21 0123     (approximate)  I have reviewed the triage vital signs and the nursing notes.   HISTORY  Chief Complaint Weakness    HPI Jonathon Newman is a 65 y.o. male history of coronary disease CHF previous cervical and lumbar surgeries  Patient for about 2 months now has had weakness in his left lower leg and also some pain in his lower right lower posterior leg "sciatica" as well as some in his left lower leg.  However he reports over the last month and it worsened over the last 2 weeks he is lost quite a bit of mobility.  He is gone to starting to use a cane about a month and a half ago now to the point for the last week or so that his wife has to assist him at all times he basically has to use a wheelchair and his wife is to the point now where she has to lift his leg if he needs to get into a chair in or out of bed as his left leg just seems to have lost all strength.  He also reports a slight weakness in his left hand its been present for about a month now where at times he will have to use 2 hands to pick up things like a cup to get it to his mouth.  Denies any shaking or tremulousness.  He is walking seems to be slightly difficult.  Also reports a slight feeling of numbness in the left arm very light feeling of numbness, and also numbness around the left foot and the left ankle.  The left foot and ankle also have a foot drop but reports that is been chronic  He reports that he say see neurosurgery they did an MRI of his neck as well as his thoracic spine recently today he felt that he may need a lumbar MRI which was performed.  Denies headaches no chest pain or trouble breathing.  Did not have any episode that he would consider to be an obvious stroke no difficulty speaking no facial droop.  No  recent vaccinations no recent illnesses.  Denies known history of MS or ALS in the family.  Weakness is all noted on the left side particularly the left hand left leg   Past Medical History:  Diagnosis Date   Arthritis    CHF (congestive heart failure) (HCC)    Coronary artery disease    PCI and Cypher drug-eluting stent placement to the right coronary artery in 2010.  NSTEMI in 2016.  Cardiac catheterization showed patent RCA stent, significant distal left main disease with an FFR ratio of 0.76 and ostial left circumflex stenosis.  Underwent CABG with LIMA to LAD and SVG to OM 2.   Diabetes mellitus without complication (HCC)    Dyspnea    GERD (gastroesophageal reflux disease)    Hypercholesteremia    Hypertension    S/P coronary artery bypass graft x 2    Sleep apnea    uses CPAP    Patient Active Problem List   Diagnosis Date Noted   Hyperlipidemia associated with type 2 diabetes mellitus (HCC) 12/27/2020   Elevated transaminase level 10/04/2020   Morbid obesity (HCC) 05/20/2020   T2DM (type 2 diabetes mellitus) (HCC) 07/17/2019   Benign paroxysmal positional vertigo due to bilateral vestibular disorder 03/15/2019   Abnormal  findings on diagnostic imaging of lung 03/15/2019   Chest pain 03/10/2019   Unstable angina (HCC) 04/25/2018   SOB (shortness of breath) 04/13/2018   Dizziness 04/13/2018   S/P coronary artery bypass graft x 2    Incomplete bladder emptying 07/21/2017   Low back pain 07/09/2017   Lumbar radiculopathy 07/09/2017   Gastroesophageal reflux disease with esophagitis 07/05/2017   Congestive heart failure (HCC) 07/05/2017   Benign prostatic hyperplasia with weak urinary stream 07/05/2017   Dysphagia 07/05/2017   DDD (degenerative disc disease), lumbar 07/05/2017   Coronary artery disease involving native coronary artery of native heart with angina pectoris (HCC) 07/31/2015   Hypertension associated with type 2 diabetes mellitus (HCC) 07/31/2015   OSA  (obstructive sleep apnea) 07/31/2015    Past Surgical History:  Procedure Laterality Date   APPENDECTOMY     BACK SURGERY  11/01/2017   SL 5 and S1   CARDIAC CATHETERIZATION     CARPAL TUNNEL RELEASE     left hand   CHOLECYSTECTOMY     CHOLECYSTECTOMY     CORONARY ARTERY BYPASS GRAFT  04/21/2015   CABG x 2 [place] [facility] LIMA to LAD amd SVG to OM2   CORONARY STENT PLACEMENT  2010   Cordis Cypher Sirolimus-eluting stent 2.50 mm x 26 mm placed to the RCA at [facility]    ESOPHAGEAL MANOMETRY N/A 08/04/2017   Procedure: ESOPHAGEAL MANOMETRY (EM);  Surgeon: Toney Reil, MD;  Location: [facility] ENDOSCOPY;  Service: Endoscopy;  Laterality: N/A;   FRACTURE SURGERY     KNEE SURGERY     KNEE SURGERY     right knee    LAMINECTOMY     "plate in neck Z6-X0"   LEFT HEART CATH AND CORS/GRAFTS ANGIOGRAPHY N/A 05/02/2018   Procedure: CORS/GRAFTS ANGIOGRAPHY;  Surgeon: Iran Ouch, MD;  Location: [facility] INVASIVE CV LAB;  Service: Cardiovascular;  Laterality: N/A;   RIGHT/LEFT HEART CATH AND CORONARY ANGIOGRAPHY Bilateral 05/02/2018   Procedure: LEFT HEART CATH;  Surgeon: Iran Ouch, MD;  Location: [facility] INVASIVE CV LAB;  Service: Cardiovascular;  Laterality: Bilateral;   VASECTOMY      Prior to Admission medications   Medication Sig Start Date End Date Taking? Authorizing Provider  predniSONE (DELTASONE) 50 MG tablet 1 tab daily, start 02-15-2021 02/14/21  Yes Sharyn Creamer, MD  allopurinol (ZYLOPRIM) 100 MG tablet TAKE TWO TABLETS BY MOUTH DAILY 01/13/21   Chrismon, Jodell Cipro, PA-C  allopurinol (ZYLOPRIM) 300 MG tablet Take 1 tablet (300 mg total) by mouth daily. 07/30/20   Margaretann Loveless, PA-C  amLODipine (NORVASC) 5 MG tablet Take 1 tablet (5 mg total) by mouth 2 (two) times daily. 01/23/20   Antonieta Iba, MD  aspirin 81 MG tablet Take 81 mg by mouth daily.    [provider]  atorvastatin (LIPITOR) 40 MG tablet Take 1 tablet (40 mg total) by mouth  daily. 01/23/20   Antonieta Iba, MD  Blood Glucose Monitoring Suppl (FREESTYLE LITE) DEVI To check blood sugar once daily 07/20/19   Joycelyn Man M, PA-C  carvedilol (COREG) 25 MG tablet TAKE ONE TABLET BY MOUTH TWICE A DAY WITH MEALS 12/13/20   Gollan, Tollie Pizza, MD  cyclobenzaprine (FLEXERIL) 10 MG tablet TAKE ONE TABLET BY MOUTH THREE TIMES A DAY AS NEEDED FOR MUSCLE SPASMS 02/07/21   Merita Norton T, FNP  diclofenac Sodium (VOLTAREN) 1 % GEL Apply 4 g topically 4 (four) times daily as needed. 02/07/21   Suzie Portela,  Daryl Eastern, FNP  Dulaglutide (TRULICITY) 1.5 MG/0.5ML SOPN Inject 1.5 mg into the skin once a week. 12/27/20   Erasmo Downer, MD  ezetimibe (ZETIA) 10 MG tablet TAKE ONE TABLET BY MOUTH DAILY 12/18/20   Antonieta Iba, MD  glucose blood (FREESTYLE LITE) test strip USE TO CHECK BLOOD SUGAR ONCE DAILY 12/23/20   Beryle Flock, Marzella Schlein, MD  isosorbide mononitrate (IMDUR) 60 MG 24 hr tablet Take 1 tablet (60 mg total) by mouth daily. 01/23/20   Antonieta Iba, MD  Lancets (FREESTYLE) lancets USE TO CHECK BLOOD SUGAR ONCE DAILY 12/23/20   Bacigalupo, Marzella Schlein, MD  losartan (COZAAR) 100 MG tablet Take 1 tablet (100 mg total) by mouth daily. 01/17/21   Jacky Kindle, FNP  meloxicam (MOBIC) 15 MG tablet Take 1 tablet (15 mg total) by mouth daily. 02/07/21   Jacky Kindle, FNP  metFORMIN (GLUCOPHAGE-XR) 500 MG 24 hr tablet TAKE TWO TABLETS BY MOUTH TWICE A DAY WITH A MEAL 01/17/21   Bacigalupo, Marzella Schlein, MD  Multiple Vitamin (MULTIVITAMIN WITH MINERALS) TABS tablet Take 1 tablet by mouth daily.    [provider]  nitroGLYCERIN (NITROSTAT) 0.4 MG SL tablet Place 1 tablet (0.4 mg total) under the tongue every 5 (five) minutes as needed for chest pain. 01/22/20   Antonieta Iba, MD  pantoprazole (PROTONIX) 40 MG tablet Take 1 tablet (40 mg total) by mouth 2 (two) times daily before a meal. 08/12/20   Burnette, Alessandra Bevels, PA-C  potassium chloride (KLOR-CON) 10 MEQ tablet TAKE ONE TABLET BY  MOUTH TWICE A DAY AS NEEDED 12/18/20   Antonieta Iba, MD  pregabalin (LYRICA) 150 MG capsule Take 1 capsule (150 mg total) by mouth 2 (two) times daily. 08/28/20   Margaretann Loveless, PA-C  senna (SENOKOT) 8.6 MG TABS tablet Take 1 tablet by mouth daily.    [provider]  sucralfate (CARAFATE) 1 GM/10ML suspension Take 10 mLs (1 g total) by mouth 4 (four) times daily -  with meals and at bedtime. 08/20/20   Antonieta Iba, MD  torsemide (DEMADEX) 20 MG tablet TAKE TWO TABLETS BY MOUTH TWICE A DAY 12/18/20   Antonieta Iba, MD    Allergies Ace inhibitors, Colchicine, and Lisinopril  Family History  Problem Relation Age of Onset   Arthritis Mother    Hyperlipidemia Father    Hypertension Father    Heart attack Father 60   Heart murmur Brother    Valvular heart disease Brother    Cataracts Maternal Grandmother    Glaucoma Maternal Grandmother    Cancer Maternal Grandfather    Heart attack Paternal Grandfather    Hypertension Paternal Grandfather    Prostate cancer Neg Hx    Bladder Cancer Neg Hx    Kidney cancer Neg Hx     Social History Social History   Tobacco Use   Smoking status: Former    Packs/day: 0.25    Years: 2.00    Pack years: 0.50    Types: Cigarettes    Quit date: 05/26/2005    Years since quitting: 15.7   Smokeless tobacco: Former    Types: Associate Professor Use: Never used  Substance Use Topics   Alcohol use: No   Drug use: No    Review of Systems Constitutional: No fever/chills Eyes: No visual changes. ENT: No sore throat. Cardiovascular: Denies chest pain. Respiratory: Denies shortness of breath. Gastrointestinal: No abdominal pain.   Genitourinary:  Negative for dysuria.  No urinary or fecal incontinence. Musculoskeletal: Negative for back pain except for his lower back slight left and then also a bit of a right-sided pain that Micah NoelLance needs from his buttock towards his back of his right knee which she describes as sciatica  that he has had for quite some time, reports his back pain is present and fairly unchanged for the last 2 months does not seem to be worsening and he denies having any neck pain or mid back pain. Skin: Negative for rash. Neurological: Negative for headaches.    ____________________________________________   PHYSICAL EXAM:  VITAL SIGNS: ED Triage Vitals  Enc Vitals Group     BP 02/13/21 1817 93/66     Pulse Rate 02/13/21 1817 91     Resp 02/13/21 1817 16     Temp 02/13/21 1817 97.8 F (36.6 C)     Temp Source 02/13/21 1817 Oral     SpO2 02/13/21 1817 94 %     Weight 02/13/21 1820 214 lb (97.1 kg)     Height 02/13/21 1820 5\' 7"  (1.702 m)     Head Circumference --      Peak Flow --      Pain Score 02/13/21 1817 8     Pain Loc --      Pain Edu? --      Excl. in GC? --     Constitutional: Alert and oriented. Well appearing and in no acute distress. Eyes: Conjunctivae are normal. Head: Atraumatic. Nose: No congestion/rhinnorhea. Mouth/Throat: Mucous membranes are moist. Neck: No stridor.  Cardiovascular: Normal rate, regular rhythm. Grossly normal heart sounds.  Good peripheral circulation. Respiratory: Normal respiratory effort.  No retractions. Lungs CTAB. Gastrointestinal: Soft and nontender. No distention. Musculoskeletal: No lower extremity tenderness nor edema.  5 out of 5 strength in bilateral upper extremities.  Demonstrates some perhaps very minimal ataxia with use of the left hand subtle.  None noted on the right.  Right lower extremity 4+ strength dorsi and plantar flexion normal, hip flexors and extensors demonstrate slight weakness.  Strong dorsalis pedis and posterior tibial pulses in the bilateral lower extremities with normal capillary refill.  Left lower extremity demonstrates approximately 3 out of 5 strength at the hip, left-sided foot drop, good plantarflexion normal.  Reports decreased sensation over the top and sides of the left foot left anterior shin reports  normal sensation over the inner portion of the upper thigh.  Reflexes subtly diminished in the left knee, normal on the right side.  No clonus. Neurologic:  Normal speech and language. No gross focal neurologic deficits are appreciated.  Skin:  Skin is warm, dry and intact. No rash noted. Psychiatric: Mood and affect are normal. Speech and behavior are normal.  ____________________________________________   LABS (all labs ordered are listed, but only abnormal results are displayed)  Labs Reviewed  CBC WITH DIFFERENTIAL/PLATELET - Abnormal; Notable for the following components:      Result Value   WBC 11.0 (*)    MCHC 36.1 (*)    Monocytes Absolute 1.2 (*)    Eosinophils Absolute 2.8 (*)    Basophils Absolute 0.2 (*)    Abs Immature Granulocytes 0.10 (*)    All other components within normal limits  BASIC METABOLIC PANEL - Abnormal; Notable for the following components:   Sodium 131 (*)    Chloride 94 (*)    Glucose, Bld 136 (*)    BUN 60 (*)    Creatinine, Ser 1.57 (*)  GFR, Estimated 49 (*)    All other components within normal limits  BASIC METABOLIC PANEL - Abnormal; Notable for the following components:   Sodium 132 (*)    Chloride 95 (*)    Glucose, Bld 129 (*)    BUN 54 (*)    Creatinine, Ser 1.45 (*)    GFR, Estimated 54 (*)    All other components within normal limits  PATHOLOGIST SMEAR REVIEW   ____________________________________________  EKG   ____________________________________________  RADIOLOGY  MR BRAIN WO CONTRAST  Result Date: 02/14/2021 CLINICAL DATA:  Left leg and arm weakness EXAM: MRI HEAD WITHOUT CONTRAST TECHNIQUE: Multiplanar, multiecho pulse sequences of the brain and surrounding structures were obtained without intravenous contrast. COMPARISON:  04/03/2020 FINDINGS: Brain: No acute infarct, mass effect or extra-axial collection. No acute or chronic hemorrhage. There is multifocal hyperintense T2-weighted signal within the white matter.  Generalized volume loss without a clear lobar predilection. The midline structures are normal. Vascular: Major flow voids are preserved. Skull and upper cervical spine: Normal calvarium and skull base. Visualized upper cervical spine and soft tissues are normal. Sinuses/Orbits:No paranasal sinus fluid levels or advanced mucosal thickening. No mastoid or middle ear effusion. Normal orbits. IMPRESSION: 1. No acute intracranial abnormality. 2. Findings of mild chronic small vessel ischemia and generalized volume loss. Electronically Signed   By: Deatra Robinson M.D.   On: 02/14/2021 02:40   MR LUMBAR SPINE WO CONTRAST  Result Date: 02/13/2021 CLINICAL DATA:  Low back pain and weakness EXAM: MRI LUMBAR SPINE WITHOUT CONTRAST TECHNIQUE: Multiplanar, multisequence MR imaging of the lumbar spine was performed. No intravenous contrast was administered. COMPARISON:  None. FINDINGS: Segmentation:  Standard. Alignment:  Physiologic. Vertebrae:  No fracture, evidence of discitis, or bone lesion. Conus medullaris and cauda equina: Conus extends to the L1 level. Conus and cauda equina appear normal. Paraspinal and other soft tissues: Negative. Disc levels: L1-L2: Normal disc space and facet joints. No spinal canal stenosis. No neural foraminal stenosis. L2-L3: Normal disc space and facet joints. No spinal canal stenosis. No neural foraminal stenosis. L3-L4: Mild disc bulge and mild facet hypertrophy. No spinal canal stenosis. No neural foraminal stenosis. L4-L5: Bilateral laminectomies. Moderate facet hypertrophy with increased widening of the right facet joint. Small disc bulge with right lateral recess narrowing. No central spinal canal stenosis. Unchanged mild right and moderate left neural foraminal stenosis. L5-S1: Small left subarticular disc protrusion with annular fissure. Bilateral laminectomies. No spinal canal stenosis. No neural foraminal stenosis. Visualized sacrum: Normal. IMPRESSION: 1. Unchanged moderate left  L4-5 neural foraminal stenosis. 2. Bilateral laminectomies at L4-L5 and L5-S1 without central spinal canal stenosis. Electronically Signed   By: Deatra Robinson M.D.   On: 02/13/2021 21:01     Imaging studies reviewed including MRI brain and lumbar spine.  Discussed both with Dr. Marcell Barlow. ____________________________________________   PROCEDURES  Procedure(s) performed: None  Procedures  Critical Care performed: No  ____________________________________________   INITIAL IMPRESSION / ASSESSMENT AND PLAN / ED COURSE  Pertinent labs & imaging results that were available during my care of the patient were reviewed by me and considered in my medical decision making (see chart for details).   Patient presents for a subtle but worsening weakness particularly involving the left side.  Is been seen by neurosurgery and being evaluated for similar, reviewed notes by Dr. Marcell Barlow.  He had recent cervical and thoracic MRI.  Lumbar MRI this evening appears somewhat unrevealing as to acute pathology to explain his worsening difficulty with walking.  Given the nature of his symptoms a slight ataxia as well as the report of some slight left-sided numbness or paresthesias in the left arm and a subacute to chronic timeframe we will also obtain MRI of the brain which to exclude central pathology such as stroke, tumor etc.  Check basic labs CBC BMP.  He does not peer to have any obvious infectious etiology.  Denies any cardiac or pulmonary symptoms.  Case discussed with neurosurgery Dr. Marcell Barlow  Clinical Course as of 02/14/21 0626  Fri Feb 14, 2021  9833 Imaging and clinical history discussed with Dr. Marcell Barlow.  He reports that he is actually already reviewed the patient's MRIs including cervical thoracic and today's lumbar MRI as he knew the patient was coming in.  He advises close follow-up and the patient already has an appointment with him this week.  He does recommend consideration for a use of  steroid to see if this helps alleviate any symptoms.  No evidence to support MS, no obvious bilateral a sending symptoms, nothing that would suggest myasthenia, I do not see evidence to suggest an acute infectious or obvious rapidly evolving central neurologic or inflammatory etiology at this time.  Discussed with patient and his wife, I did offer patient could board in the ER until about 8 or 6 9 this morning to see neurologist as well, but patient advises he would like to be able to go home and follow-up with Dr. Marcell Barlow as first step which I think is quite reasonable.  Both he and his wife feel that he is safe at home, and that they did do not need further assistance at this time but will follow up with neurosurgery this week [MQ]  0322 Patient's evaluation very reassuring.  No evidence of a sending neurologic process.  I suspect and after having discussed with Dr. Marcell Barlow and also affirming with the patient that the symptoms seem to be somewhat chronic in nature the patient reports that his symptoms have been stable involving the left upper arm with a slight feeling of weakness for about 1 month.  The left lower leg he reports been present for about 2 months at least but seems to have been slowly worsening over the last couple weeks.  No right-sided symptoms. [MQ]  [id number] Labs reviewed to note mild hyponatremia, mild elevation of creatinine from baseline.  Will give small fluid bolus patient does have history of CHF but no symptoms or evidence of active CHF with edema or overload. [MQ]  0526 WBC Morphology  MORPHOLOGY UNREMARKABLE        RBC Morphology  MORPHOLOGY UNREMARKABLE        Smear Review  Normal platelet morphology          [MQ]    Clinical Course User Index [MQ] Sharyn Creamer, MD   ----------------------------------------- 6:23 AM on 02/14/2021 ----------------------------------------- Patient resting comfortably.  After receiving small fluid bolus patient's sodium has improved  slightly as well as his creatinine improved.  He is resting comfortably he feels ready to go home and feels well at this time.  Symptoms remain stable, has a plan in place to follow-up with neurology this coming week, also recommended that he set up a primary care visit for early this coming week which she is agreeable to to call and set up. (I did send an inbox message to Verna Czech ( his primary care provider) requesting follow-up appointment and also recommending a repeat BMP for early next week)  Return precautions and treatment recommendations  and follow-up discussed with the patient who is agreeable with the plan.   ____________________________________________   FINAL CLINICAL IMPRESSION(S) / ED DIAGNOSES  Final diagnoses:  Acute left-sided muscle weakness  Hyponatremia        Note:  This document was prepared using Dragon voice recognition software and may include unintentional dictation errors       Sharyn Creamer, MD 02/14/21 [phone]

## 2021-02-14 NOTE — ED Notes (Signed)
Patient provided box lunch at this time Patient updated on plan of care Side rails up, call bell within reach Patient is AxOx4, will continue to monitor

## 2021-02-14 NOTE — ED Notes (Signed)
Pt is in MRI  

## 2021-02-15 LAB — BASIC METABOLIC PANEL
BUN/Creatinine Ratio: 29 — ABNORMAL HIGH (ref 10–24)
BUN: 45 mg/dL — ABNORMAL HIGH (ref 8–27)
CO2: 23 mmol/L (ref 20–29)
Calcium: 9.7 mg/dL (ref 8.6–10.2)
Chloride: 96 mmol/L (ref 96–106)
Creatinine, Ser: 1.55 mg/dL — ABNORMAL HIGH (ref 0.76–1.27)
Glucose: 187 mg/dL — ABNORMAL HIGH (ref 65–99)
Potassium: 5.1 mmol/L (ref 3.5–5.2)
Sodium: 140 mmol/L (ref 134–144)
eGFR: 50 mL/min/{1.73_m2} — ABNORMAL LOW (ref >59)

## 2021-02-15 LAB — HEMOGLOBIN A1C
Est. average glucose Bld gHb Est-mCnc: 137 mg/dL
Hgb A1c MFr Bld: 6.4 % — ABNORMAL HIGH (ref 4.8–5.6)

## 2021-02-22 ENCOUNTER — Other Ambulatory Visit: Payer: Self-pay | Admitting: Physician Assistant

## 2021-02-22 DIAGNOSIS — M47817 Spondylosis without myelopathy or radiculopathy, lumbosacral region: Secondary | ICD-10-CM

## 2021-02-22 DIAGNOSIS — M5416 Radiculopathy, lumbar region: Secondary | ICD-10-CM

## 2021-02-22 DIAGNOSIS — M5137 Other intervertebral disc degeneration, lumbosacral region: Secondary | ICD-10-CM

## 2021-03-08 ENCOUNTER — Other Ambulatory Visit: Payer: Self-pay | Admitting: Cardiovascular Disease

## 2021-03-18 ENCOUNTER — Other Ambulatory Visit: Payer: Self-pay | Admitting: Family Medicine

## 2021-03-18 ENCOUNTER — Other Ambulatory Visit: Payer: Self-pay | Admitting: Cardiovascular Disease

## 2021-03-18 DIAGNOSIS — I25118 Atherosclerotic heart disease of native coronary artery with other forms of angina pectoris: Secondary | ICD-10-CM

## 2021-03-18 DIAGNOSIS — I10 Essential (primary) hypertension: Secondary | ICD-10-CM

## 2021-03-18 DIAGNOSIS — E1151 Type 2 diabetes mellitus with diabetic peripheral angiopathy without gangrene: Secondary | ICD-10-CM

## 2021-03-18 NOTE — Telephone Encounter (Signed)
Requested medication (s) are due for refill today: Yes  Requested medication (s) are on the active medication list: Yes  Last refill:  01/17/21  Future visit scheduled: Yes  Notes to clinic:  Protocol indicates pt. Needs lab work.    Requested Prescriptions  Pending Prescriptions Disp Refills   metFORMIN (GLUCOPHAGE-XR) 500 MG 24 hr tablet [Pharmacy Med Name: METFORMIN HCL XR 500 MG TABLET] 360 tablet 0    Sig: TAKE TWO TABLETS BY MOUTH TWICE A DAY WITH A MEAL     Endocrinology:  Diabetes - Biguanides Failed - 03/18/2021  9:02 AM      Failed - Cr in normal range and within 360 days    Creatinine, Ser  Date Value Ref Range Status  02/14/2021 1.55 (H) 0.76 - 1.27 mg/dL Final          Failed - eGFR in normal range and within 360 days    GFR calc Af Amer  Date Value Ref Range Status  06/28/2020 72 >59 mL/min/1.73 Final    Comment:    **In accordance with recommendations from the NKF-ASN Task force,**   [facility] is in the process of updating its eGFR calculation to the   2021 CKD-EPI creatinine equation that estimates kidney function   without a race variable.    GFR, Estimated  Date Value Ref Range Status  02/14/2021 54 (L) >60 mL/min Final    Comment:    (NOTE) Calculated using the CKD-EPI Creatinine Equation (2021)    eGFR  Date Value Ref Range Status  02/14/2021 50 (L) >59 mL/min/1.73 Final          Passed - HBA1C is between 0 and 7.9 and within 180 days    Hgb A1c MFr Bld  Date Value Ref Range Status  02/14/2021 6.4 (H) 4.8 - 5.6 % Final    Comment:             Prediabetes: 5.7 - 6.4          Diabetes: >6.4          Glycemic control for adults with diabetes: <7.0           Passed - Valid encounter within last 6 months    Recent Outpatient Visits           2 months ago Type 2 diabetes mellitus with diabetic peripheral angiopathy without gangrene, without long-term current use of insulin [facility])   [facility] [place], Dionne Bucy, MD   3  months ago Type 2 diabetes mellitus with hyperosmolarity without coma, without long-term current use of insulin [facility])   [place], Vickki Muff, PA-C   4 months ago Lumbar radiculopathy   [employer] [facility], Vickki Muff, PA-C   5 months ago Type 2 diabetes mellitus with diabetic peripheral angiopathy without gangrene, without long-term current use of insulin ([place])   [place], Kelby Aline, FNP   8 months ago DDD (degenerative disc disease), lumbosacral   [facility] [place], Clearnce Sorrel, [place]       Future Appointments             In 1 week Gwyneth Sprout, [place], [facility]

## 2021-03-25 ENCOUNTER — Other Ambulatory Visit: Payer: Self-pay | Admitting: Physician Assistant

## 2021-03-25 DIAGNOSIS — K2100 Gastro-esophageal reflux disease with esophagitis, without bleeding: Secondary | ICD-10-CM

## 2021-03-26 ENCOUNTER — Ambulatory Visit (HOSPITAL_BASED_OUTPATIENT_CLINIC_OR_DEPARTMENT_OTHER): Admitting: Student in an Organized Health Care Education/Training Program

## 2021-03-26 ENCOUNTER — Encounter: Payer: Self-pay | Admitting: Student in an Organized Health Care Education/Training Program

## 2021-03-26 ENCOUNTER — Ambulatory Visit
Admission: RE | Admit: 2021-03-26 | Discharge: 2021-03-26 | Disposition: A | Discharge status: Home / Self Care | Source: Ambulatory Visit | Attending: Student in an Organized Health Care Education/Training Program | Admitting: Student in an Organized Health Care Education/Training Program

## 2021-03-26 ENCOUNTER — Other Ambulatory Visit: Payer: Self-pay

## 2021-03-26 VITALS — BP 116/79 | HR 88 | Temp 97.9°F | Resp 18 | Ht 67.0 in | Wt 215.0 lb

## 2021-03-26 DIAGNOSIS — M5416 Radiculopathy, lumbar region: Secondary | ICD-10-CM | POA: Diagnosis not present

## 2021-03-26 DIAGNOSIS — G894 Chronic pain syndrome: Secondary | ICD-10-CM | POA: Insufficient documentation

## 2021-03-26 DIAGNOSIS — G8929 Other chronic pain: Secondary | ICD-10-CM | POA: Diagnosis not present

## 2021-03-26 DIAGNOSIS — M961 Postlaminectomy syndrome, not elsewhere classified: Secondary | ICD-10-CM

## 2021-03-26 NOTE — Progress Notes (Signed)
Safety precautions to be maintained throughout the outpatient stay will include: orient to surroundings, keep bed in low position, maintain call bell within reach at all times, provide assistance with transfer out of bed and ambulation.  

## 2021-03-26 NOTE — Patient Instructions (Addendum)
Stop Mobic 7 days prior to trial Continue ASA 81 mg Wash with antimicrobial night before and morning of procedure Contact Dr Marcell Barlow to get psych assessment sent to [place]

## 2021-03-26 NOTE — Progress Notes (Signed)
Patient: Jonathon Newman  Service Category: E/M  Provider: Gillis Santa, MD  DOB: [birth year]  DOS: 03/26/2021  Referring Provider: Meade Maw, MD  MRN: [id number]  Setting: Ambulatory outpatient  PCP: Jonathon Sprout, FNP  Type: New Patient  Specialty: Interventional Pain Management    Location: Office  Delivery: Face-to-face     Primary Reason(s) for Visit: Encounter for initial evaluation of one or more chronic problems (new to examiner) potentially causing chronic pain, and posing a threat to normal musculoskeletal function. (Level of risk: High) CC: Back Pain  HPI  Jonathon Newman is a 65 y.o. year old, male patient, who comes for the first time to our practice referred by Jonathon Maw, MD for our initial evaluation of his chronic pain. He has Coronary artery disease involving native coronary artery of native heart with angina pectoris ([place]); Hypertension associated with type 2 diabetes mellitus ([place]); OSA (obstructive sleep apnea); Gastroesophageal reflux disease with esophagitis; Congestive heart failure ([place]); Benign prostatic hyperplasia with weak urinary stream; Dysphagia; DDD (degenerative disc disease), lumbar; Incomplete bladder emptying; Low back pain; Lumbar radiculopathy; S/P coronary artery bypass graft x 2; SOB (shortness of breath); Dizziness; Unstable angina ([place]); Chest pain; Benign paroxysmal positional vertigo due to bilateral vestibular disorder; Abnormal findings on diagnostic imaging of lung; T2DM (type 2 diabetes mellitus) ([place]); Morbid obesity ([place]); Elevated transaminase level; Hyperlipidemia associated with type 2 diabetes mellitus ([place]); and Hyponatremia on their problem list. Today he comes in for evaluation of his Back Pain  Pain Assessment: Location: Left, Right, Lower Back Radiating: radiates into rigth and left hip and rasites to right side of the hip muscle Onset: More than a month ago Duration: Chronic pain Quality: Contraction, Sharp, Tingling  (Annoying) Severity: 4 /10 (subjective, self-reported pain score)  Effect on ADL: "About 20% active right now because of the pain" Timing: Constant Modifying factors: Laying down on right side and sleeping BP: 116/79  HR: 88  Onset and Duration: Gradual and Present longer than 3 months Cause of pain: Unknown Severity: Getting worse, NAS-11 at its worse: 8/10, NAS-11 at its best: 4/10, NAS-11 now: 7/10, and NAS-11 on the average: 6/10 Timing: Not influenced by the time of the day Aggravating Factors: Bending, Motion, Prolonged sitting, Prolonged standing, and Squatting Alleviating Factors: Lying down and Sleeping Associated Problems: Constipation, Dizziness, Erectile dysfunction, Fatigue, Numbness, Swelling, Tingling, and Weakness Quality of Pain: Annoying, Constant, Sharp, and Tingling Previous Examinations or Tests: CT scan, Endoscopy, MRI scan, Spinal tap, X-rays, Nerve conduction test, Neurological evaluation, and Orthopedic evaluation Previous Treatments: Narcotic medications, Physical Therapy, Steroid treatments by mouth, TENS, and Trigger point injections  Jonathon Newman is a pleasant 65 year old male who is accompanied by his wife who is being referred for spinal cord stimulator trial for persistent low back pain that radiates into bilateral lower extremities new dermatomal fashion.  He has a history of 2 prior lumbar spine surgeries in 2016 with Jonathon Newman.  He states that the first surgery did not go well and so they had to do a second surgery that same year.  He developed left foot drop since his second surgery.  He is a diabetic and also has an impressive cardiac history with history of CABG.  He is only on aspirin 81 mg.  He takes Mobic as needed.  He also utilizes Flexeril for muscle spasms.  He has done physical therapy in the past with limited response.  He ambulates with a walker and states that he does not have much quality of life  given weakness of bilateral legs and chronic pain.  He is  also on Lyrica 150 mg twice a day.  He has tried lumbar epidural injections in the past but they did not provide long-lasting benefit he does not want to redo them in the context of his diabetes.  Of note patient has had his thoracic, lumbar MRI done, results of which are below.  There is nothing in there that would preclude a safe spinal cord stimulator trial.  Patient states that he has completed his psychological evaluation.  We will contact [facility] to get that and then submit for insurance approval for [employer] SCS trial for failed back surgical syndrome, postlaminectomy pain syndrome, chronic lumbar radicular pain.  Meds   Current Outpatient Medications:    allopurinol (ZYLOPRIM) 100 MG tablet, TAKE TWO TABLETS BY MOUTH DAILY, Disp: 180 tablet, Rfl: 1   allopurinol (ZYLOPRIM) 300 MG tablet, Take 1 tablet (300 mg total) by mouth daily., Disp: 90 tablet, Rfl: 3   amLODipine (NORVASC) 5 MG tablet, TAKE ONE TABLET BY MOUTH TWICE A DAY, Disp: 180 tablet, Rfl: 0   aspirin 81 MG tablet, Take 81 mg by mouth daily., Disp: , Rfl:    atorvastatin (LIPITOR) 40 MG tablet, TAKE ONE TABLET BY MOUTH DAILY, Disp: 90 tablet, Rfl: 0   Blood Glucose Monitoring Suppl (FREESTYLE LITE) DEVI, To check blood sugar once daily, Disp: 1 each, Rfl: 0   carvedilol (COREG) 25 MG tablet, TAKE ONE TABLET BY MOUTH TWICE A DAY WITH MEALS, Disp: 180 tablet, Rfl: 1   cyclobenzaprine (FLEXERIL) 10 MG tablet, TAKE ONE TABLET BY MOUTH THREE TIMES A DAY AS NEEDED FOR MUSCLE SPASMS, Disp: 90 tablet, Rfl: 1   Dulaglutide (TRULICITY) 1.5 PT/4.6FK SOPN, Inject 1.5 mg into the skin once a week., Disp: 6 mL, Rfl: 1   ezetimibe (ZETIA) 10 MG tablet, TAKE ONE TABLET BY MOUTH DAILY, Disp: 90 tablet, Rfl: 0   glucose blood (FREESTYLE LITE) test strip, USE TO CHECK BLOOD SUGAR ONCE DAILY, Disp: 100 strip, Rfl: 4   isosorbide mononitrate (IMDUR) 60 MG 24 hr tablet, Take 1 tablet (60 mg total) by mouth daily. PLEASE CALL TO  SCHEDULE OFFICE VISIT FOR FURTHER REFILLS. THANK YOU!, Disp: 90 tablet, Rfl: 0   Lancets (FREESTYLE) lancets, USE TO CHECK BLOOD SUGAR ONCE DAILY, Disp: 100 each, Rfl: 4   losartan (COZAAR) 100 MG tablet, TAKE ONE TABLET MY MOUTH DAILY, Disp: 90 tablet, Rfl: 0   meloxicam (MOBIC) 15 MG tablet, Take 1 tablet (15 mg total) by mouth daily., Disp: 90 tablet, Rfl: 0   metFORMIN (GLUCOPHAGE-XR) 500 MG 24 hr tablet, TAKE TWO TABLETS BY MOUTH TWICE A DAY WITH A MEAL, Disp: 360 tablet, Rfl: 0   Multiple Vitamin (MULTIVITAMIN WITH MINERALS) TABS tablet, Take 1 tablet by mouth daily., Disp: , Rfl:    nitroGLYCERIN (NITROSTAT) 0.4 MG SL tablet, Place 1 tablet (0.4 mg total) under the tongue every 5 (five) minutes as needed for chest pain., Disp: 25 tablet, Rfl: 3   pantoprazole (PROTONIX) 40 MG tablet, Take 1 tablet (40 mg total) by mouth 2 (two) times daily before a meal., Disp: 180 tablet, Rfl: 1   potassium chloride (KLOR-CON) 10 MEQ tablet, TAKE ONE TABLET BY MOUTH TWICE A DAY AS NEEDED, Disp: 60 tablet, Rfl: 4   pregabalin (LYRICA) 150 MG capsule, TAKE 1 CAPSULE BY MOUTH TWO TIMES A DAY, Disp: 180 capsule, Rfl: 0   senna (SENOKOT) 8.6 MG TABS tablet, Take 1 tablet  by mouth daily., Disp: , Rfl:    torsemide (DEMADEX) 20 MG tablet, TAKE TWO TABLETS BY MOUTH TWICE A DAY, Disp: 120 tablet, Rfl: 4  Imaging Review  Cervical Imaging: Cervical MR wo contrast: Results for orders placed during the hospital encounter of 01/05/21  MR CERVICAL SPINE WO CONTRAST  Narrative CLINICAL DATA:  S/P cervical spinal fusion Z98.1 (ICD-10-CM). Ataxia R27.0 (ICD-10-CM)Imbalance R26.89 (ICD-10-CM). Left foot drop M21.372 (ICD-10-CM). Additional history provided by scanning technologist: Patient reports ataxia and left foot drop for 4 months.  EXAM: MRI CERVICAL SPINE WITHOUT CONTRAST  TECHNIQUE: Multiplanar, multisequence MR imaging of the cervical spine was performed. No intravenous contrast was  administered.  COMPARISON:  Brain MRI 04/03/2020.  FINDINGS: Alignment: Straightening of the expected cervical lordosis. Trace C4-C5 grade 1 anterolisthesis. Trace C7-T1 and T1-T2 grade 1 anterolisthesis.  Vertebrae: Susceptibility artifact arising from ACDF hardware at the C6-C7 level. No appreciable significant marrow edema or focal suspicious osseous lesion.  Cord: No spinal cord signal abnormality is identified.  Posterior Fossa, vertebral arteries, paraspinal tissues: No abnormality identified within included portions of the posterior fossa. Flow voids preserved within the imaged cervical vertebral arteries. Paraspinal soft tissues within normal limits  Disc levels:  Mild multilevel disc degeneration, greatest at C5-C6 and C7-T1.  C2-C3: No significant disc herniation or stenosis.  C3-C4: Shallow disc bulge. Left greater than right uncovertebral hypertrophy. Minimal facet arthrosis. Minimal partial effacement of the ventral thecal sac without spinal cord mass effect. Bilateral neural foraminal narrowing (mild right, moderate left).  C4-C5: Trace grade 1 anterolisthesis. Slight disc uncovering and disc bulge. Mild facet arthrosis. Small facet joint effusion on the right. No significant spinal canal stenosis. Bilateral neural foraminal narrowing (mild right, moderate left).  C5-C6: Disc bulge with bilateral disc osteophyte ridge/uncinate hypertrophy. Minimal facet arthrosis. Mild spinal canal stenosis without spinal cord mass effect. Bilateral neural foraminal narrowing (moderate right, moderate/severe left).  C6-C7: Prior ACDF. Solid fusion across the disc space. No significant spinal canal or foraminal stenosis.  C7-T1: Trace grade 1 anterolisthesis. Slight disc uncovering. Mild to moderate facet arthrosis/ligamentum flavum hypertrophy. No significant spinal canal stenosis. Mild bilateral neural foraminal narrowing (greater on the left).  IMPRESSION: At C6-C7,  there has been prior ACDF. There is fusion across the disc space. No significant spinal canal or foraminal stenosis.  Cervical spondylosis, as outlined. No more than mild spinal canal stenosis. Multilevel neural foraminal narrowing, greatest on the left at C3-C4 (moderate), on the left at C4-C5 (moderate) and bilaterally at C5-C6 (moderate right, moderate/severe left).  Also of note, there is a small right-sided facet joint effusion at C4-C5.   Electronically Signed By: Kellie Simmering DO On: 01/06/2021 08:15  MR THORACIC SPINE WO CONTRAST  Narrative CLINICAL DATA:  Ataxia, left foot drop  EXAM: MRI THORACIC SPINE WITHOUT CONTRAST  TECHNIQUE: Multiplanar, multisequence MR imaging of the thoracic spine was performed. No intravenous contrast was administered.  COMPARISON:  None.  FINDINGS: Alignment:  Preserved.  Vertebrae: Vertebral body heights are maintained. There is a vertebral body hemangioma at T6. No marrow edema. No suspicious osseous lesion. Partially imaged lower cervical anterior fusion.  Cord:  No abnormal signal.  Paraspinal and other soft tissues: Unremarkable.  Disc levels: Intervertebral disc heights are maintained. There is a small central disc protrusion at T5-T6 indenting the ventral thecal sac. No significant canal or foraminal stenosis at any level.  IMPRESSION: No abnormal cord signal. Small central disc protrusion at T5-T6. No significant canal or foraminal stenosis at any  level.   Electronically Signed By: Macy Mis M.D. On: 02/03/2021 08:35  MR LUMBAR SPINE WO CONTRAST  Narrative CLINICAL DATA:  Low back pain and weakness  EXAM: MRI LUMBAR SPINE WITHOUT CONTRAST  TECHNIQUE: Multiplanar, multisequence MR imaging of the lumbar spine was performed. No intravenous contrast was administered.  COMPARISON:  None.  FINDINGS: Segmentation:  Standard.  Alignment:  Physiologic.  Vertebrae:  No fracture, evidence of discitis, or  bone lesion.  Conus medullaris and cauda equina: Conus extends to the L1 level. Conus and cauda equina appear normal.  Paraspinal and other soft tissues: Negative.  Disc levels:  L1-L2: Normal disc space and facet joints. No spinal canal stenosis. No neural foraminal stenosis.  L2-L3: Normal disc space and facet joints. No spinal canal stenosis. No neural foraminal stenosis.  L3-L4: Mild disc bulge and mild facet hypertrophy. No spinal canal stenosis. No neural foraminal stenosis.  L4-L5: Bilateral laminectomies. Moderate facet hypertrophy with increased widening of the right facet joint. Small disc bulge with right lateral recess narrowing. No central spinal canal stenosis. Unchanged mild right and moderate left neural foraminal stenosis.  L5-S1: Small left subarticular disc protrusion with annular fissure. Bilateral laminectomies. No spinal canal stenosis. No neural foraminal stenosis.  Visualized sacrum: Normal.  IMPRESSION: 1. Unchanged moderate left L4-5 neural foraminal stenosis. 2. Bilateral laminectomies at L4-L5 and L5-S1 without central spinal canal stenosis.   Electronically Signed By: Ulyses Jarred M.D. On: 02/13/2021 21:01 DG Lumbar Spine Complete  Narrative CLINICAL DATA:  Low back pain, weakness  EXAM: LUMBAR SPINE - COMPLETE 4+ VIEW  COMPARISON:  05/07/2018  FINDINGS: Five lumbar type vertebral segments. Vertebral body heights and alignment are maintained. No fracture identified. Intervertebral disc spaces are relatively preserved. Minimal degenerative endplate changes. Mild lower lumbar facet arthrosis. Moderate volume of stool projects throughout the colon. Scattered abdominal aortic atherosclerotic calcification.  IMPRESSION: Mild lower lumbar spondylosis without appreciable interval progression from prior.   Electronically Signed By: Davina Poke D.O. On: 06/06/2020 13:56   Narrative CLINICAL DATA:  Bilateral hip  pain  EXAM: DG HIP (WITH OR WITHOUT PELVIS) 2-3V RIGHT; DG HIP (WITH OR WITHOUT PELVIS) 2-3V LEFT  COMPARISON:  None.  FINDINGS: There is no evidence of hip fracture or dislocation. Mild bilateral hip joint space narrowing. No significant SI joint arthropathy. Postsurgical changes within the low pelvis. Surgical clips within the right groin region and medial right thigh.  IMPRESSION: Mild bilateral hip joint space narrowing.   Electronically Signed By: Davina Poke D.O. On: 06/06/2020 13:53    Narrative CLINICAL DATA:  Bilateral hip pain  EXAM: DG HIP (WITH OR WITHOUT PELVIS) 2-3V RIGHT; DG HIP (WITH OR WITHOUT PELVIS) 2-3V LEFT  COMPARISON:  None.  FINDINGS: There is no evidence of hip fracture or dislocation. Mild bilateral hip joint space narrowing. No significant SI joint arthropathy. Postsurgical changes within the low pelvis. Surgical clips within the right groin region and medial right thigh.  IMPRESSION: Mild bilateral hip joint space narrowing.   Electronically Signed By: Davina Poke D.O. On: 06/06/2020 13:53  Complexity Note: Imaging results reviewed. Results shared with Mr. Fix, using Layman's terms.                         ROS  Cardiovascular: Heart trouble, Daily Aspirin intake, High blood pressure, Heart surgery, Heart failure, and Heart catheterization Pulmonary or Respiratory: Smoking, Snoring , and Temporary stoppage of breathing during sleep Neurological: No reported neurological signs  or symptoms such as seizures, abnormal skin sensations, urinary and/or fecal incontinence, being born with an abnormal open spine and/or a tethered spinal cord Psychological-Psychiatric: No reported psychological or psychiatric signs or symptoms such as difficulty sleeping, anxiety, depression, delusions or hallucinations (schizophrenial), mood swings (bipolar disorders) or suicidal ideations or attempts Gastrointestinal: Vomiting blood (Ulcers),  Reflux or heatburn, and Irregular, infrequent bowel movements (Constipation) Genitourinary: No reported renal or genitourinary signs or symptoms such as difficulty voiding or producing urine, peeing blood, non-functioning kidney, kidney stones, difficulty emptying the bladder, difficulty controlling the flow of urine, or chronic kidney disease Hematological: No reported hematological signs or symptoms such as prolonged bleeding, low or poor functioning platelets, bruising or bleeding easily, hereditary bleeding problems, low energy levels due to low hemoglobin or being anemic Endocrine: High blood sugar controlled without the use of insulin (NIDDM) Rheumatologic: No reported rheumatological signs and symptoms such as fatigue, joint pain, tenderness, swelling, redness, heat, stiffness, decreased range of motion, with or without associated rash Musculoskeletal: Negative for myasthenia gravis, muscular dystrophy, multiple sclerosis or malignant hyperthermia Work History: Retired  Allergies  Mr. Mccants is allergic to colchicine and lisinopril.  Laboratory Chemistry Profile   Renal Lab Results  Component Value Date   BUN 45 (H) 02/14/2021   CREATININE 1.55 (H) 02/14/2021   BCR 29 (H) 02/14/2021   GFRAA 72 06/28/2020   GFRNONAA 54 (L) 02/14/2021   SPECGRAV 1.015 08/17/2017   PHUR 7.0 08/17/2017   PROTEINUR Negative 08/17/2017     Electrolytes Lab Results  Component Value Date   NA 140 02/14/2021   K 5.1 02/14/2021   CL 96 02/14/2021   CALCIUM 9.7 02/14/2021     Hepatic Lab Results  Component Value Date   AST 183 (H) 02/10/2021   ALT 299 (H) 02/10/2021   ALBUMIN 4.6 02/10/2021   ALKPHOS 178 (H) 02/10/2021   LIPASE 35 08/26/2015     ID Lab Results  Component Value Date   HIV Non Reactive 03/10/2019   [place] NEGATIVE 03/10/2019     Bone No results found for: VD25OH, VD125OH2TOT, XB1478GN5, AO1308MV7, 25OHVITD1, 25OHVITD2, 25OHVITD3, TESTOFREE, TESTOSTERONE    Endocrine Lab Results  Component Value Date   GLUCOSE 187 (H) 02/14/2021   GLUCOSEU Negative 08/17/2017   HGBA1C 6.4 (H) 02/14/2021   TSH 1.600 02/02/2018     Neuropathy Lab Results  Component Value Date   HGBA1C 6.4 (H) 02/14/2021   HIV Non Reactive 03/10/2019     CNS No results found for: COLORCSF, APPEARCSF, RBCCOUNTCSF, WBCCSF, POLYSCSF, LYMPHSCSF, EOSCSF, PROTEINCSF, GLUCCSF, JCVIRUS, CSFOLI, IGGCSF, LABACHR, ACETBL, LABACHR, ACETBL   Inflammation (CRP: Acute  ESR: Chronic) No results found for: CRP, ESRSEDRATE, LATICACIDVEN   Rheumatology Lab Results  Component Value Date   LABURIC 6.8 09/27/2020     Coagulation Lab Results  Component Value Date   PLT 220 02/14/2021     Cardiovascular Lab Results  Component Value Date   BNP 8.0 09/08/2017   TROPONINI <0.03 08/26/2015   HGB 15.1 02/14/2021   HCT 41.8 02/14/2021     Screening Lab Results  Component Value Date   SARSCOV2NAA NEGATIVE 03/10/2019   HIV Non Reactive 03/10/2019     Cancer No results found for: CEA, CA125, LABCA2   Allergens No results found for: ALMOND, APPLE, ASPARAGUS, AVOCADO, BANANA, BARLEY, BASIL, BAYLEAF, GREENBEAN, LIMABEAN, WHITEBEAN, BEEFIGE, REDBEET, BLUEBERRY, BROCCOLI, CABBAGE, MELON, CARROT, CASEIN, CASHEWNUT, CAULIFLOWER, CELERY     Note: Lab results reviewed.  [place]  Drug: Mr. Leger  reports  no history of drug use. Alcohol:  reports no history of alcohol use. Tobacco:  reports that he quit smoking about 15 years ago. His smoking use included cigarettes. He has a 0.50 pack-year smoking history. He has quit using smokeless tobacco.  His smokeless tobacco use included chew. Medical:  has a past medical history of Arthritis, CHF (congestive heart failure) ([place]), Coronary artery disease, Diabetes mellitus without complication ([place]), Dyspnea, GERD (gastroesophageal reflux disease), Hypercholesteremia, Hypertension, S/P coronary artery bypass graft x 2, and Sleep apnea. Family:  family history includes Arthritis in his mother; Cancer in his maternal grandfather; Cataracts in his maternal grandmother; Glaucoma in his maternal grandmother; Heart attack in his paternal grandfather; Heart attack (age of onset: 15) in his father; Heart murmur in his brother; Hyperlipidemia in his father; Hypertension in his father and paternal grandfather; Valvular heart disease in his brother.  Past Surgical History:  Procedure Laterality Date   APPENDECTOMY     BACK SURGERY  11/01/2017   SL 5 and S1   CARDIAC CATHETERIZATION     CARPAL TUNNEL RELEASE     left hand   CHOLECYSTECTOMY     CHOLECYSTECTOMY     CORONARY ARTERY BYPASS GRAFT  04/21/2015   CABG x 2 [place] [facility] to LAD amd SVG to OM2   CORONARY STENT PLACEMENT  2010   Cordis Cypher Sirolimus-eluting stent 2.50 mm x 26 mm placed to the RCA at [place] N/A 08/04/2017   Procedure: ESOPHAGEAL MANOMETRY (EM);  Surgeon: Lin Landsman, MD;  Location: [facility] ENDOSCOPY;  Service: Endoscopy;  Laterality: N/A;   FRACTURE SURGERY     KNEE SURGERY     KNEE SURGERY     right knee    LAMINECTOMY     "plate in neck E3-X4"   LEFT HEART CATH AND CORS/GRAFTS ANGIOGRAPHY N/A 05/02/2018   Procedure: CORS/GRAFTS ANGIOGRAPHY;  Surgeon: Wellington Hampshire, MD;  Location: [place];  Service: Cardiovascular;  Laterality: N/A;   RIGHT/LEFT HEART CATH AND CORONARY ANGIOGRAPHY Bilateral 05/02/2018   Procedure: LEFT HEART CATH;  Surgeon: Wellington Hampshire, MD;  Location: [facility];  Service: Cardiovascular;  Laterality: Bilateral;   VASECTOMY     Active Ambulatory Problems    Diagnosis Date Noted   Coronary artery disease involving native coronary artery of native heart with angina pectoris ([place]) 07/31/2015   Hypertension associated with type 2 diabetes mellitus ([place]) 07/31/2015   OSA (obstructive sleep apnea) 07/31/2015   Gastroesophageal reflux disease with esophagitis  07/05/2017   Congestive heart failure ([place]) 07/05/2017   Benign prostatic hyperplasia with weak urinary stream 07/05/2017   Dysphagia 07/05/2017   DDD (degenerative disc disease), lumbar 07/05/2017   Incomplete bladder emptying 07/21/2017   Low back pain 07/09/2017   Lumbar radiculopathy 07/09/2017   S/P coronary artery bypass graft x 2    SOB (shortness of breath) 04/13/2018   Dizziness 04/13/2018   Unstable angina ([place]) 04/25/2018   Chest pain 03/10/2019   Benign paroxysmal positional vertigo due to bilateral vestibular disorder 03/15/2019   Abnormal findings on diagnostic imaging of lung 03/15/2019   T2DM (type 2 diabetes mellitus) ([place]) 07/17/2019   Morbid obesity ([place]) 05/20/2020   Elevated transaminase level 10/04/2020   Hyperlipidemia associated with type 2 diabetes mellitus ([place]) 12/27/2020   Hyponatremia 02/14/2021   Resolved Ambulatory Problems    Diagnosis Date Noted   Hyperlipidemia 07/31/2015   Prediabetes 03/15/2019   Past Medical History:  Diagnosis Date   Arthritis    CHF (congestive heart failure) (H[facility])    Coronary artery disease    Diabetes mellitus without complication ([facility])    Dyspnea    GERD (gastroesophageal reflux disease)    Hypercholesteremia    Hypertension    Sleep apnea    Constitutional Exam  General appearance: Well nourished, well developed, and well hydrated. In no apparent acute distress Vitals:   03/26/21 1340  BP: 116/79  Pulse: 88  Resp: 18  Temp: 97.9 F (36.6 C)  TempSrc: Temporal  SpO2: 97%  Weight: 215 lb (97.5 kg)  Height: '5\' 7"'  (1.702 m)   BMI Assessment: Estimated body mass index is 33.67 kg/m as calculated from the following:   Height as of this encounter: '5\' 7"'  (1.702 m).   Weight as of this encounter: 215 lb (97.5 kg).  BMI interpretation table: BMI level Category Range association with higher incidence of chronic pain  <18 kg/m2 Underweight   18.5-24.9 kg/m2 Ideal body weight   25-29.9 kg/m2 Overweight  Increased incidence by 20%  30-34.9 kg/m2 Obese (Class I) Increased incidence by 68%  35-39.9 kg/m2 Severe obesity (Class II) Increased incidence by 136%  >40 kg/m2 Extreme obesity (Class III) Increased incidence by 254%   Patient's current BMI Ideal Body weight  Body mass index is 33.67 kg/m. Ideal body weight: 66.1 kg (145 lb 11.6 oz) Adjusted ideal body weight: 78.7 kg (173 lb 6.9 oz)   BMI Readings from Last 4 Encounters:  03/26/21 33.67 kg/m  02/13/21 33.52 kg/m  12/27/20 36.34 kg/m  12/05/20 38.06 kg/m   Wt Readings from Last 4 Encounters:  03/26/21 215 lb (97.5 kg)  02/13/21 214 lb (97.1 kg)  12/27/20 232 lb (105.2 kg)  11/04/20 243 lb (110.2 kg)    Psych/Mental status: Alert, oriented x 3 (person, place, & time)       Eyes: PERLA Respiratory: No evidence of acute respiratory distress  Thoracic Spine Area Exam  Skin & Axial Inspection: No masses, redness, or swelling Alignment: Symmetrical Functional ROM: Pain restricted ROM Stability: No instability detected Muscle Tone/Strength: Functionally intact. No obvious neuro-muscular anomalies detected. Sensory (Neurological): Unimpaired Muscle strength & Tone: No palpable anomalies   Lumbar Spine Area Exam  Skin & Axial Inspection: Well healed scar from previous spine surgery detected Alignment: Symmetrical Functional ROM: Pain restricted ROM       Stability: No instability detected Muscle Tone/Strength: Functionally intact. No obvious neuro-muscular anomalies detected. Sensory (Neurological): Dermatomal pain pattern  Positive straight leg raise test bilaterally  Gait & Posture Assessment  Ambulation: Patient ambulates using a walker Gait: Significantly limited. Dependent on assistive device to ambulate Posture: Difficulty standing up straight, due to pain  Lower Extremity Exam    Side: Right lower extremity  Side: Left lower extremity  Stability: No instability observed          Stability: No instability  observed          Skin & Extremity Inspection: Skin color, temperature, and hair growth are WNL. No peripheral edema or cyanosis. No masses, redness, swelling, asymmetry, or associated skin lesions. No contractures.  Skin & Extremity Inspection: Skin color, temperature, and hair growth are WNL. No peripheral edema or cyanosis. No masses, redness, swelling, asymmetry, or associated skin lesions. No contractures.  Functional ROM: Pain restricted ROM                  Functional ROM: Pain restricted ROM  Muscle Tone/Strength: Movement possible against some resistance (4/5)  Muscle Tone/Strength: L4 myotomal weakness (Foot Dorsiflexion)  Sensory (Neurological): Dermatomal pain pattern        Sensory (Neurological): Dermatomal pain pattern        DTR: Patellar: deferred today Achilles: deferred today Plantar: deferred today  DTR: Patellar: deferred today Achilles: deferred today Plantar: deferred today  Palpation: No palpable anomalies  Palpation: No palpable anomalies    Assessment  Primary Diagnosis & Pertinent Problem List: The primary encounter diagnosis was Failed back surgical syndrome. Diagnoses of Post laminectomy syndrome, Chronic radicular lumbar pain, and Chronic pain syndrome were also pertinent to this visit.  Visit Diagnosis (New problems to examiner): 1. Failed back surgical syndrome   2. Post laminectomy syndrome   3. Chronic radicular lumbar pain   4. Chronic pain syndrome    Plan of Care (Initial workup plan)    I discussed  percutaneous spinal cord stimulator trial with the patient in detail.  I was able to evaluate the patient's interlaminar windows under live fluoroscopy and they appear patent for safe percutaneous access.  I also reviewed the patient's thoracic and lumbar MRI with him in great detail and there is nothing in there to prevent [place] moving forward with an SCS trial.    I explained to the patient that they will have an external power source  and programmer which the patient will use for 7 days. There will be daily communication with the stimulator company and the patient.   A possible need for a mid-trial clinic visit to give the patient the best chance of success.   Patient has already completed his psychological evaluation needed for SCS trial.  We will need to get the results from Jonathon Newman office.  Some of patient's pain does seem to be mechanical in nature, with some component of neurogenic pain as well. We discussed the indications for spinal cord stimulation, specifically stating that it is typically better for neuropathic and appendicular pain, but that we have had some success in the treatment of low back and hip pain.   Patient is interested in proceeding with spinal cord stimulation trial. He understands that this may not be successful, and that spinal cord stimulation in general is not a "magic bullet."   We had a lengthy and very detailed discussion of all the risks, benefits, alternatives, and rationale of surgery as well as the option of continuing nonsurgical therapies. We specifically discussed the risks of temporary or permanent worsened neurologic injury, no symptomatic relief or pain made worse after procedure, and also the need for future surgery (due to infection, CSF leak, bleeding, adjacent segment issues, bone-healing difficulties, and other related issues). No guarantees of outcome were made or implied and he is eager to proceed and presents for definitive treatment.   Jonathon Newman told me that all of his questions were answered thoroughly and to his satisfaction. Confidence and understanding of the discussed risks and consequences of  treatment was expressed and he accepted these risks and was eager to proceed with procedure.   Issues concerning treatment and diagnosis were discussed with the patient. There are no barriers to understanding the plan of treatment. Explanation was well received by patient  and/or family who then verbalized understanding.   Orders Placed This Encounter  Procedures   [facility] representative to notify them of the scheduled case and to make sure they will be available to provide required equipment.  Standing Status:   Future    Standing Expiration Date:   09/24/2021    Scheduling Instructions:     Side: Bilateral     Level: Lumbar     Device: Medtronic     Sedation: With sedation     Timeframe: As soon as pre-approved    Order Specific Question:   Where will this procedure be performed?    Answer:   [facility]   DG PAIN CLINIC C-ARM 1-60 MIN NO REPORT    Intraoperative interpretation by procedural physician at [place].    Standing Status:   Standing    Number of Occurrences:   1    Order Specific Question:   Reason for exam:    Answer:   Assistance in needle guidance and placement for procedures requiring needle placement in or near specific anatomical locations not easily accessible without such assistance.      Imaging Orders         DG PAIN CLINIC C-ARM 1-60 MIN NO REPORT       Procedure Orders         [place] TRIAL       Provider-requested follow-up: Return in about 2 weeks (around 04/09/2021) for Medtronic SCS trial , moderate sedation (ECT suite).  I spent a total of 60 minutes reviewing chart data, face-to-face evaluation with the patient, counseling and coordination of care as detailed above.   Future Appointments  Date Time Provider [place]  03/31/2021  8:00 AM Jonathon Sprout, FNP [facility]    Note by: Jonathon Santa, MD Date: 03/26/2021; Time: 2:57 PM

## 2021-03-31 ENCOUNTER — Ambulatory Visit (INDEPENDENT_AMBULATORY_CARE_PROVIDER_SITE_OTHER): Admitting: Family Medicine

## 2021-03-31 ENCOUNTER — Other Ambulatory Visit: Payer: Self-pay

## 2021-03-31 ENCOUNTER — Encounter: Payer: Self-pay | Admitting: Family Medicine

## 2021-03-31 VITALS — BP 143/80 | HR 68 | Resp 16

## 2021-03-31 DIAGNOSIS — I10 Essential (primary) hypertension: Secondary | ICD-10-CM

## 2021-03-31 DIAGNOSIS — E1151 Type 2 diabetes mellitus with diabetic peripheral angiopathy without gangrene: Secondary | ICD-10-CM

## 2021-03-31 DIAGNOSIS — K2100 Gastro-esophageal reflux disease with esophagitis, without bleeding: Secondary | ICD-10-CM

## 2021-03-31 DIAGNOSIS — E1169 Type 2 diabetes mellitus with other specified complication: Secondary | ICD-10-CM

## 2021-03-31 DIAGNOSIS — Z9181 History of falling: Secondary | ICD-10-CM | POA: Diagnosis not present

## 2021-03-31 DIAGNOSIS — M21372 Foot drop, left foot: Secondary | ICD-10-CM

## 2021-03-31 DIAGNOSIS — E785 Hyperlipidemia, unspecified: Secondary | ICD-10-CM

## 2021-03-31 DIAGNOSIS — Z6833 Body mass index (BMI) 33.0-33.9, adult: Secondary | ICD-10-CM | POA: Insufficient documentation

## 2021-03-31 NOTE — Progress Notes (Signed)
Established patient visit   Patient: Jonathon Newman   DOB: 1955/08/19   65 y.o. Male  MRN: [id number] Visit Date: 03/31/2021  Today's healthcare provider: Gwyneth Sprout, FNP   Chief Complaint  Patient presents with   Diabetes   Hypertension   Hyperlipidemia   Gastroesophageal Reflux   Subjective    HPI  Diabetes Mellitus Type II, follow-up  Lab Results  Component Value Date   HGBA1C 6.4 (H) 02/14/2021   HGBA1C 7.5 (A) 12/05/2020   HGBA1C 9.0 (H) 09/27/2020   Last seen for diabetes 3 months ago.  Management since then includes Continue metformin Increase trulicity to 1.5 mg weekly He reports excellent compliance with treatment. He is not having side effects.   Home blood sugar records: fasting range: 118-114  Episodes of hypoglycemia? No ;knows what to do 'describes as out of body' gets OJ    Current insulin regiment: none Most Recent Eye Exam: >1 year  --------------------------------------------------------------------------------------------------- Hypertension, follow-up  BP Readings from Last 3 Encounters:  03/31/21 (!) 143/80  03/26/21 116/79  02/14/21 110/72   Wt Readings from Last 3 Encounters:  03/26/21 215 lb (97.5 kg)  02/13/21 214 lb (97.1 kg)  12/27/20 232 lb (105.2 kg)     He was last seen for hypertension 3 months ago.  BP at that visit was 137/86. Management since that visit includes none. He reports excellent compliance with treatment. He is not having side effects.  He is not exercising. He is adherent to low salt diet.   Outside blood pressures are not being checked.  He does not smoke.  Use of agents associated with hypertension: NSAIDS.   --------------------------------------------------------------------------------------------------- Lipid/Cholesterol, follow-up  Last Lipid Panel: Lab Results  Component Value Date   CHOL 146 12/27/2020   LDLCALC 22 12/27/2020   HDL 25 (L) 12/27/2020   TRIG 775 ([place]) 12/27/2020     He was last seen for this 3 months ago.  Management since that visit includes continue statin and Zetia .  He reports excellent compliance with treatment. He is not having side effects.   Symptoms: No appetite changes No foot ulcerations  No chest pain No chest pressure/discomfort  No dyspnea No orthopnea  No fatigue Yes lower extremity edema  No palpitations No paroxysmal nocturnal dyspnea  No nausea Yes numbness or tingling of extremity  Yes polydipsia No polyuria  No speech difficulty No syncope   He is following a Regular diet. Current exercise: not asked  Last metabolic panel Lab Results  Component Value Date   GLUCOSE 187 (H) 02/14/2021   NA 140 02/14/2021   K 5.1 02/14/2021   BUN 45 (H) 02/14/2021   CREATININE 1.55 (H) 02/14/2021   EGFR 50 (L) 02/14/2021   GFRNONAA 54 (L) 02/14/2021   CALCIUM 9.7 02/14/2021   AST 183 (H) 02/10/2021   ALT 299 (H) 02/10/2021   The 10-year ASCVD risk score (Arnett DK, et al., 2019) is: 45.8%  ---------------------------------------------------------------------------------------------------  GERD, Follow up:  The patient was last seen for GERD 3 months ago. Changes made since that visit include advising to cut back on NSAID.  He reports excellent compliance with treatment. He is not having side effects. Marland Kitchen  He IS experiencing  no symptoms . He is NOT experiencing abdominal bloating, belching, chest pain, choking on food, cough, deep pressure at base of neck, difficulty swallowing, fullness after meals, or heartburn  -----------------------------------------------------------------------------------------    Medications: Outpatient Medications Prior to Visit  Medication Sig  allopurinol (ZYLOPRIM) 300 MG tablet Take 1 tablet (300 mg total) by mouth daily.   amLODipine (NORVASC) 5 MG tablet TAKE ONE TABLET BY MOUTH TWICE A DAY   aspirin 81 MG tablet Take 81 mg by mouth daily.   atorvastatin (LIPITOR) 40 MG tablet TAKE  ONE TABLET BY MOUTH DAILY   Blood Glucose Monitoring Suppl (FREESTYLE LITE) DEVI To check blood sugar once daily   carvedilol (COREG) 25 MG tablet TAKE ONE TABLET BY MOUTH TWICE A DAY WITH MEALS   cyclobenzaprine (FLEXERIL) 10 MG tablet TAKE ONE TABLET BY MOUTH THREE TIMES A DAY AS NEEDED FOR MUSCLE SPASMS   Dulaglutide (TRULICITY) 1.5 QX/4.5WT SOPN Inject 1.5 mg into the skin once a week.   ezetimibe (ZETIA) 10 MG tablet TAKE ONE TABLET BY MOUTH DAILY   glucose blood (FREESTYLE LITE) test strip USE TO CHECK BLOOD SUGAR ONCE DAILY   isosorbide mononitrate (IMDUR) 60 MG 24 hr tablet Take 1 tablet (60 mg total) by mouth daily. PLEASE CALL TO SCHEDULE OFFICE VISIT FOR FURTHER REFILLS. THANK YOU!   Lancets (FREESTYLE) lancets USE TO CHECK BLOOD SUGAR ONCE DAILY   losartan (COZAAR) 100 MG tablet TAKE ONE TABLET MY MOUTH DAILY   meloxicam (MOBIC) 15 MG tablet Take 1 tablet (15 mg total) by mouth daily.   metFORMIN (GLUCOPHAGE-XR) 500 MG 24 hr tablet TAKE TWO TABLETS BY MOUTH TWICE A DAY WITH A MEAL   Multiple Vitamin (MULTIVITAMIN WITH MINERALS) TABS tablet Take 1 tablet by mouth daily.   nitroGLYCERIN (NITROSTAT) 0.4 MG SL tablet Place 1 tablet (0.4 mg total) under the tongue every 5 (five) minutes as needed for chest pain.   pantoprazole (PROTONIX) 40 MG tablet TAKE ONE TABLET BY MOUTH DAILY   potassium chloride (KLOR-CON) 10 MEQ tablet TAKE ONE TABLET BY MOUTH TWICE A DAY AS NEEDED   pregabalin (LYRICA) 150 MG capsule TAKE 1 CAPSULE BY MOUTH TWO TIMES A DAY   senna (SENOKOT) 8.6 MG TABS tablet Take 1 tablet by mouth daily.   torsemide (DEMADEX) 20 MG tablet TAKE TWO TABLETS BY MOUTH TWICE A DAY   allopurinol (ZYLOPRIM) 100 MG tablet TAKE TWO TABLETS BY MOUTH DAILY   No facility-administered medications prior to visit.    Review of Systems     Objective    BP (!) 143/80   Pulse 68   Resp 16    Physical Exam Vitals and nursing note reviewed.  Constitutional:      Appearance: Normal  appearance. He is obese.  HENT:     Head: Normocephalic and atraumatic.  Eyes:     Pupils: Pupils are equal, round, and reactive to light.     Comments: Recent eye exam, 9/30- asked to sign records at eye dr for release of information  Cardiovascular:     Rate and Rhythm: Normal rate and regular rhythm.     Pulses: Normal pulses.     Heart sounds: Normal heart sounds.  Pulmonary:     Effort: Pulmonary effort is normal.     Breath sounds: Normal breath sounds.  Musculoskeletal:        General: Deformity present. Normal range of motion.     Cervical back: Normal range of motion.     Comments: Hx of L foot drop  Skin:    General: Skin is warm and dry.     Capillary Refill: Capillary refill takes less than 2 seconds.  Neurological:     General: No focal deficit present.  Mental Status: He is alert and oriented to person, place, and time. Mental status is at baseline.     No results found for any visits on 03/31/21.  Assessment & Plan     Problem List Items Addressed This Visit       Cardiovascular and Mediastinum   Essential hypertension    Chronic, stable Typically 120s Slight elevation today Pt report increased pain d/t weather changes- adding to stress in getting to appointments [facility]      Type 2 diabetes mellitus with diabetic peripheral angiopathy without gangrene, without long-term current use of insulin (HCC)    Improving A1c has improved greatly over last 6 months- pt and SO engaged in maintenance of dz process and are tracking BG and dietary intake         Digestive   Gastroesophageal reflux disease with esophagitis    Waxes/wanes More common in early morning Encourage to keep diary of intake to track problem some foods        Endocrine   Hyperlipidemia associated with type 2 diabetes mellitus ([place])    Reinforced goals Continue medications No acute concerns        Other   Morbid obesity ([place])    Discussed importance of healthy weight  management Discussed diet and exercise  BMI 34  Htn, hld, dm, ddd      History of recent fall - Primary    W/i last week d/t use of foot drop brace      Foot drop, left    Chronic report; unable to tolerate brace Last time he wore- resulted in fall      BMI 33.0-33.9,adult    Has successfully loss weight; congratulated        Return in about 6 months (around 09/29/2021) for chonic disease management, T2DM management.      Vonna Kotyk, FNP, have reviewed all documentation for this visit. The documentation on 03/31/21 for the exam, diagnosis, procedures, and orders are all accurate and complete.    Gwyneth Sprout, [place] [phone] (phone) [phone] (fax)  [place]

## 2021-03-31 NOTE — Assessment & Plan Note (Signed)
Chronic report; unable to tolerate brace Last time he wore- resulted in fall

## 2021-03-31 NOTE — Assessment & Plan Note (Signed)
Reinforced goals Continue medications No acute concerns

## 2021-03-31 NOTE — Assessment & Plan Note (Signed)
Has successfully loss weight; congratulated

## 2021-03-31 NOTE — Assessment & Plan Note (Signed)
Discussed importance of healthy weight management Discussed diet and exercise  BMI 34  Htn, hld, dm, ddd

## 2021-03-31 NOTE — Assessment & Plan Note (Signed)
W/i last week d/t use of foot drop brace

## 2021-03-31 NOTE — Assessment & Plan Note (Signed)
Chronic, stable Typically 120s Slight elevation today Pt report increased pain d/t weather changes- adding to stress in getting to appointments [facility]

## 2021-03-31 NOTE — Assessment & Plan Note (Signed)
Improving A1c has improved greatly over last 6 months- pt and SO engaged in maintenance of dz process and are tracking BG and dietary intake

## 2021-03-31 NOTE — Assessment & Plan Note (Signed)
Waxes/wanes More common in early morning Encourage to keep diary of intake to track problem some foods

## 2021-04-09 ENCOUNTER — Telehealth: Payer: Self-pay

## 2021-04-09 ENCOUNTER — Other Ambulatory Visit: Payer: Self-pay | Admitting: Family Medicine

## 2021-04-09 DIAGNOSIS — M5137 Other intervertebral disc degeneration, lumbosacral region: Secondary | ICD-10-CM

## 2021-04-09 DIAGNOSIS — M5416 Radiculopathy, lumbar region: Secondary | ICD-10-CM

## 2021-04-09 DIAGNOSIS — M25551 Pain in right hip: Secondary | ICD-10-CM

## 2021-04-09 NOTE — Telephone Encounter (Signed)
Copied from CRM [id number]. Topic: Referral - Request for Referral >> Apr 03, 2021 11:08 AM Louie Bun, Rosey Bath D wrote: Reason for CRM: Dr. Cherylann Ratel office [facility] called and said that Dr. Myer Haff send a referral to them but they need it to come from his pcp for insurance to cover since patient has [employer]. If any question to call [facility] back, thanks.

## 2021-04-16 ENCOUNTER — Encounter: Payer: Self-pay | Admitting: Student in an Organized Health Care Education/Training Program

## 2021-04-16 ENCOUNTER — Other Ambulatory Visit: Payer: Self-pay

## 2021-04-16 ENCOUNTER — Ambulatory Visit
Admission: RE | Admit: 2021-04-16 | Discharge: 2021-04-16 | Disposition: A | Discharge status: Home / Self Care | Source: Ambulatory Visit | Attending: Student in an Organized Health Care Education/Training Program | Admitting: Student in an Organized Health Care Education/Training Program

## 2021-04-16 ENCOUNTER — Telehealth: Payer: Self-pay

## 2021-04-16 ENCOUNTER — Ambulatory Visit (HOSPITAL_BASED_OUTPATIENT_CLINIC_OR_DEPARTMENT_OTHER): Admitting: Student in an Organized Health Care Education/Training Program

## 2021-04-16 DIAGNOSIS — G894 Chronic pain syndrome: Secondary | ICD-10-CM

## 2021-04-16 DIAGNOSIS — G8929 Other chronic pain: Secondary | ICD-10-CM

## 2021-04-16 DIAGNOSIS — M5416 Radiculopathy, lumbar region: Secondary | ICD-10-CM | POA: Insufficient documentation

## 2021-04-16 DIAGNOSIS — M961 Postlaminectomy syndrome, not elsewhere classified: Secondary | ICD-10-CM | POA: Diagnosis not present

## 2021-04-16 MED ORDER — MIDAZOLAM HCL 5 MG/5ML IJ SOLN
INTRAMUSCULAR | Status: AC
  Filled 2021-04-16: qty 5

## 2021-04-16 MED ORDER — LIDOCAINE HCL (PF) 2 % IJ SOLN
INTRAMUSCULAR | Status: AC
  Filled 2021-04-16: qty 15

## 2021-04-16 MED ORDER — SODIUM CHLORIDE (PF) 0.9 % IJ SOLN
INTRAMUSCULAR | Status: AC
  Filled 2021-04-16: qty 10

## 2021-04-16 MED ORDER — FENTANYL CITRATE (PF) 100 MCG/2ML IJ SOLN
INTRAMUSCULAR | Status: AC
  Filled 2021-04-16: qty 2

## 2021-04-16 MED ORDER — LIDOCAINE HCL 2 % IJ SOLN
20.0000 mL | Freq: Once | INTRAMUSCULAR | Status: AC
  Administered 2021-04-16: 100 mg

## 2021-04-16 MED ORDER — MIDAZOLAM HCL 5 MG/5ML IJ SOLN
0.5000 mg | Freq: Once | INTRAMUSCULAR | Status: AC
  Administered 2021-04-16: 1 mg via INTRAVENOUS

## 2021-04-16 MED ORDER — FENTANYL CITRATE (PF) 100 MCG/2ML IJ SOLN
25.0000 ug | INTRAMUSCULAR | Status: DC | PRN
  Administered 2021-04-16: 100 ug via INTRAVENOUS

## 2021-04-16 MED ORDER — ROPIVACAINE HCL 2 MG/ML IJ SOLN
20.0000 mL | Freq: Once | INTRAMUSCULAR | Status: AC
  Administered 2021-04-16: 20 mL

## 2021-04-16 MED ORDER — CEPHALEXIN 500 MG PO CAPS: 500.0000 mg | ORAL_CAPSULE | Freq: Four times a day (QID) | ORAL | 0 refills | Status: AC

## 2021-04-16 MED ORDER — CEFAZOLIN SODIUM-DEXTROSE 2-4 GM/100ML-% IV SOLN
2.0000 g | Freq: Once | INTRAVENOUS | Status: AC
  Administered 2021-04-16: 2 g via INTRAVENOUS

## 2021-04-16 MED ORDER — CEFAZOLIN SODIUM 1 G IJ SOLR
INTRAMUSCULAR | Status: AC
  Filled 2021-04-16: qty 20

## 2021-04-16 NOTE — Progress Notes (Signed)
Safety precautions to be maintained throughout the outpatient stay will include: orient to surroundings, keep bed in low position, maintain call bell within reach at all times, provide assistance with transfer out of bed and ambulation.  

## 2021-04-16 NOTE — Patient Instructions (Addendum)
Today we did the following -We have done a Spinal Cord Stimulator Trial with Medtronic  -As long as the leads are in place, do not bathe or shower. You may sponge bathe.  -While the lead is in place, please limit the bending, lifting, or twisting because the lead can move.  -The things we want to see is if your pain improves (and by what percentage), if you can do more activity (don't overdo it), and if you can use less of your "as needed" medicine. Do not stop long acting medicines like methadone, oxycontin, MS Contin, etc without checking with [place].  -It is VERY important that you pick up the antibiotics we prescribed, Keflex, on your way home from the trial and take them as prescribed(4 times a day), starting today, for as long as the lead is in place.  -The Spina Cord Stimulator Representative will be in contact with you while the lead is in place to make sure the trial goes as well as possible.  -Please contact us with any questions or concerns at any time during the trial.   -If you start running a fever over 100 degrees, have severe back pain, or new pain running down the legs, or drainage coming from the lead site, contact us immediately and/or go to the emergency room.  -Please do not restart any sort of medication that can thin your blood such as Aspirin, ibuprofen, motrin, aleve, plavix, coumadin, etc. If you aren't sure, call and ask.  -We will have you return on 04/23/21 to have the lead removed. If this is successful, at that point we can go over the details about the permanent implant.    Remember to pick up your antibiotic at your Pharmacy today.

## 2021-04-16 NOTE — Telephone Encounter (Signed)
Reports that the bleeding has stopped, but it has come through the dressing and onto his shirt. Dr. Cherylann Ratel notified, patient will return to clinic asap for eval.

## 2021-04-16 NOTE — Progress Notes (Signed)
PROVIDER NOTE: Information contained herein reflects review and annotations entered in association with encounter. Interpretation of such information and data should be left to medically-trained personnel. Information provided to patient can be located elsewhere in the medical record under "Patient Instructions". Document created using STT-dictation technology, any transcriptional errors that may result from process are unintentional.    Patient: Jonathon Newman  Service Category: Procedure Provider: Edward Jolly, MD DOB: 17-Dec-1955 DOS: 04/16/2021 Location: [facility] MRN: [id number] Setting: Ambulatory - outpatient Referring Provider: Edward Jolly, MD Type: Established Patient Specialty: Interventional Pain Management PCP: Jacky Kindle, FNP  Primary Reason for Admission: Surgical management of chronic pain condition.  Procedure:  Anesthesia, Analgesia, Anxiolysis:  Type: [employer] Trial Spinal Cord Neurostimulator Implant (Percutaneous, interlaminar, posterior epidural placement) Purpose: To determine if a permanent implant may be effective in controlling some or all of Jonathon Newman's chronic pain symptoms.  Region: Lumbar Level: T12-L1 Laterality: Bilateral Paramedial  Anesthesia: Local (1-2% Lidocaine)  Anxiolysis: IV  Sedation: Moderate  Guidance: Fluoroscopy            Indications: 1. Failed back surgical syndrome   2. Post laminectomy syndrome   3. Chronic radicular lumbar pain   4. Chronic pain syndrome    Pain Score: Pre-procedure: 7 /10 Post-procedure: 5 /10   Pre-op H&P Assessment:  Jonathon Newman is a 65 y.o. (year old), male patient, seen today for interventional treatment. He  has a past surgical history that includes Appendectomy; Cholecystectomy; Knee surgery; Cardiac catheterization; Coronary stent placement (2010); Coronary artery bypass graft (04/21/2015); Vasectomy; Knee surgery; Cholecystectomy; Carpal tunnel release; Fracture surgery; Esophageal  manometry (N/A, 08/04/2017); Back surgery (11/01/2017); Laminectomy; RIGHT/LEFT HEART CATH AND CORONARY ANGIOGRAPHY (Bilateral, 05/02/2018); and LEFT HEART CATH AND CORS/GRAFTS ANGIOGRAPHY (N/A, 05/02/2018).  Initial Vital Signs:  Pulse/EKG Rate: 71ECG Heart Rate: 70 Temp: (!) 97.3 F (36.3 C) Resp: 18 BP: 140/82 SpO2: 97 %  BMI: Estimated body mass index is 33.67 kg/m as calculated from the following:   Height as of this encounter: 5\' 7"  (1.702 m).   Weight as of this encounter: 215 lb (97.5 kg).  Risk Assessment: Allergies: Reviewed. He is allergic to colchicine and lisinopril.  Allergy Precautions: None required Coagulopathies: Reviewed. None identified.  Blood-thinner therapy: None at this time Active Infection(s): Reviewed. None identified. Jonathon Newman is afebrile  Site Confirmation: Jonathon Newman was asked to confirm the procedure and laterality before marking the site, which he did. Procedure checklist: Completed Consent: Before the procedure and under the influence of no sedative(s), amnesic(s), or anxiolytics, the patient was informed of the treatment options, risks and possible complications. To fulfill our ethical and legal obligations, as recommended by the American Medical Association's Code of Ethics, I have informed the patient of my clinical impression; the nature and purpose of the treatment or procedure; the risks, benefits, and possible complications of the intervention; the alternatives, including doing nothing; the risk(s) and benefit(s) of the alternative treatment(s) or procedure(s); and the risk(s) and benefit(s) of doing nothing.  Jonathon Newman was provided with information about the general risks and possible complications associated with most interventional procedures. These include, but are not limited to: failure to achieve desired goals, infection, bleeding, organ or nerve damage, allergic reactions, paralysis, and/or death.  In addition, he was informed of  those risks and possible complications associated to this particular procedure, which include, but are not limited to: damage to the implant; failure to decrease pain; local, systemic, or serious CNS infections, intraspinal abscess with possible  cord compression and paralysis, or life-threatening such as meningitis; intrathecal and/or epidural bleeding with formation of hematoma with possible spinal cord compression and permanent paralysis; organ damage; nerve injury or damage with subsequent sensory, motor, and/or autonomic system dysfunction, resulting in transient or permanent pain, numbness, and/or weakness of one or several areas of the body; allergic reactions, either minor or major life-threatening, such as anaphylactic or anaphylactoid reactions.  Furthermore, Jonathon Newman was informed of those risks and complications associated with the medications. These include, but are not limited to: allergic reactions (i.e.: anaphylactic or anaphylactoid reactions); arrhythmia;  Hypotension/hypertension; cardiovascular collapse; respiratory depression and/or shortness of breath; swelling or edema; medication-induced neural toxicity; particulate matter embolism and blood vessel occlusion with resultant organ, and/or nervous system infarction and permanent paralysis.  Finally, he was informed that Medicine is not an exact science; therefore, there is also the possibility of unforeseen or unpredictable risks and/or possible complications that may result in a catastrophic outcome. The patient indicated having understood very clearly. We have given the patient no guarantees and we have made no promises. Enough time was given to the patient to ask questions, all of which were answered to the patient's satisfaction. Jonathon Newman has indicated that he wanted to continue with the procedure. Attestation: I, the ordering provider, attest that I have discussed with the patient the benefits, risks, side-effects,  alternatives, likelihood of achieving goals, and potential problems during recovery for the procedure that I have provided informed consent. Date  Time: 04/16/2021  7:39 AM  Pre-Procedure Preparation:  Monitoring: As per clinic protocol. Respiration, ETCO2, SpO2, BP, heart rate and rhythm monitor placed and checked for adequate function Safety Precautions: Patient was assessed for positional comfort and pressure points before starting the procedure. Time-out: I initiated and conducted the "Time-out" before starting the procedure, as per protocol. The patient was asked to participate by confirming the accuracy of the "Time Out" information. Verification of the correct person, site, and procedure were performed and confirmed by me, the nursing staff, and the patient. "Time-out" conducted as per Joint Commission's Universal Protocol (UP.01.01.01). Time: 0822  Description of Procedure Process:   Position: Prone Target Area: Posterior epidural space Approach: Posterior percutaneous, paramedial, interlaminar approach Area Prepped: Bilateral thoraco-lumbar Region Prepping solution: ChloraPrep (2% chlorhexidine gluconate and 70% isopropyl alcohol) Safety Precautions: Safe injection practices and needle disposal techniques used. Medications properly checked for expiration dates. SDV (single dose vial) medications used. Aspiration looking for blood return and/or CSF was conducted prior to all injections. At no point did I inject any substances, as a needle was being advanced. No attempts were made at seeking any paresthesias.  Description of the Procedure: Availability of a responsible, adult driver, and NPO status confirmed. Informed consent was obtained after having discussed risks and possible complications. An IV was started. The patient was then taken to the fluoroscopy suite, where the patient was placed in position for the procedure, over the fluoroscopy table. The patient was then monitored in the  usual manner. Fluoroscopy was manipulated to obtain the best possible view of the target. Parallex error was corrected before commencing the procedure. Once a clear view of the target had been obtained, the skin and deeper tissues over the procedure site were infiltrated using lidocaine, loaded in a 10 cc luer-loc syringe with a 0.5 inch, 25-G needle. The introducer needle(s) was/were then inserted through the skin and deeper tissues. A paramidline approach was used to enter the posterior epidural space at a 30 angle, using "Loss-of-resistance Technique"  with 3 ml of PF-NaCl (0.9% NSS). Correct needle placement was confirmed in the antero-posterior and lateral fluoroscopic views. The lead was gently introduced and manipulated under real-time fluoroscopy, constantly assessing for pain, discomfort, or paresthesias, until the tip rested at the desired level. Both sides were done in identical fashion. Electrode placement was tested until appropriate coverage was attained. Once the patient confirmed that the stimulation was over the desired area, the lead(s) was/were secured in place and the introducer needles removed. This was done under real-time fluoroscopy while observing the electrode tip to avoid unintended migration. The area was covered with a non-occlusive dressing and the patient transported to recovery for further programming.  Vitals:   04/16/21 0930 04/16/21 0940 04/16/21 0950 04/16/21 1000  BP: 127/70 139/78 (!) 156/84 136/78  Pulse: 69     Resp: 18   17  Temp:  (!) 97.3 F (36.3 C)  (!) 97.3 F (36.3 C)  SpO2: 98% 98% 98% 99%  Weight:      Height:       Start Time: 0822 hrs. End Time: 0919 hrs.  Neurostimulator Details:   Lead(s):  Brand: [employer]tronic         Epidural Access Level:  T12-L1 T12-L1  Lead implant:  Left   No. of Electrodes/Lead:  8 8  Laterality:  Right Left  Top electrode location:  T8 T8  Model No.: [id number] Same  Length: 60cm Same  Lot No.: [id number]   MRI compatibility:  Yes Yes   Imaging Guidance (Spinal):          Type of Imaging Technique: Fluoroscopy Guidance (Spinal) Indication(s): Assistance in needle guidance and placement for procedures requiring needle placement in or near specific anatomical locations not easily accessible without such assistance. Exposure Time: Please see nurses notes. Contrast: None used. Fluoroscopic Guidance: I was personally present during the use of fluoroscopy. "Tunnel Vision Technique" used to obtain the best possible view of the target area. Parallax error corrected before commencing the procedure. "Direction-depth-direction" technique used to introduce the needle under continuous pulsed fluoroscopy. Once target was reached, antero-posterior, oblique, and lateral fluoroscopic projection used confirm needle placement in all planes. Images permanently stored in EMR. Interpretation: No contrast injected. I personally interpreted the imaging intraoperatively. Adequate needle placement confirmed in multiple planes. Permanent images saved into the patient's record.       Antibiotic Prophylaxis:   Anti-infectives (From admission, onward)    Start     Dose/Rate Route Frequency Ordered Stop   04/16/21 0815  ceFAZolin (ANCEF) IVPB 2g/100 mL premix        2 g 200 mL/hr over 30 Minutes Intravenous  Once 04/16/21 0800 04/16/21 0828   04/16/21 0000  cephALEXin (KEFLEX) 500 MG capsule        500 mg Oral 4 times daily 04/16/21 0800 04/23/21 2359      Indication(s): Implant Prophylaxis.  Post-operative Assessment:  Post-procedure Vital Signs:  Pulse/HCG Rate: 6980 Temp: (!) 97.3 F (36.3 C) Resp: 17 BP: 136/78 SpO2: 99 %  Complications: No immediate post-treatment complications observed by team, or reported by patient.  Note: The patient tolerated the entire procedure well. A repeat set of vitals were taken after the procedure and the patient was kept under observation following institutional policy,  for this type of procedure. Post-procedural neurological assessment was performed, showing return to baseline, prior to discharge. The patient was provided with post-procedure discharge instructions, including a section on how to identify potential problems. Should any problems arise concerning  this procedure, the patient was given instructions to immediately contact us, at any time, without hesitation. In any case, we plan to contact the patient by telephone for a follow-up status report regarding this interventional procedure.  Comments:  No additional relevant information.  Plan of Care  Orders:  Orders Placed This Encounter  Procedures   DG PAIN CLINIC C-ARM 1-60 MIN NO REPORT    Intraoperative interpretation by procedural physician at [facility].    Standing Status:   Standing    Number of Occurrences:   1    Order Specific Question:   Reason for exam:    Answer:   Assistance in needle guidance and placement for procedures requiring needle placement in or near specific anatomical locations not easily accessible without such assistance.    Medications administered: We administered lidocaine, ceFAZolin, fentaNYL, ropivacaine (PF) 2 mg/mL (0.2%), and midazolam.  See the medical record for exact dosing, route, and time of administration.  Follow-up plan:   Return in about 1 week (around 04/23/2021) for SCS lead pull.     Recent Visits Date Type Provider Dept  03/26/21 Office Visit Edward Jolly, MD [facility]  Showing recent visits within past 90 days and meeting all other requirements Today's Visits Date Type Provider Dept  04/16/21 Procedure visit Edward Jolly, MD [facility]  Showing today's visits and meeting all other requirements Future Appointments Date Type Provider Dept  04/23/21 Appointment Edward Jolly, MD [facility]  Showing future appointments within next 90 days and meeting all other requirements Disposition: Discharge  home  Discharge (Date  Time): 04/16/2021; 1014 hrs.   Primary Care Physician: Jacky Kindle, FNP Location: [facility] Note by: Edward Jolly, MD Date: 04/16/2021; Time: 10:43 AM

## 2021-04-16 NOTE — Telephone Encounter (Signed)
T says he has some bleeding at his lead sight and wants to know what to do if the bleeding persist. Please call and advise

## 2021-04-17 ENCOUNTER — Telehealth: Payer: Self-pay | Admitting: *Deleted

## 2021-04-17 ENCOUNTER — Other Ambulatory Visit: Payer: Self-pay | Admitting: Cardiovascular Disease

## 2021-04-17 DIAGNOSIS — I10 Essential (primary) hypertension: Secondary | ICD-10-CM

## 2021-04-17 DIAGNOSIS — I25118 Atherosclerotic heart disease of native coronary artery with other forms of angina pectoris: Secondary | ICD-10-CM

## 2021-04-17 NOTE — Telephone Encounter (Signed)
Please schedule F/u appointment. Thank you!

## 2021-04-17 NOTE — Telephone Encounter (Signed)
LMOV  

## 2021-04-17 NOTE — Telephone Encounter (Signed)
No problems post procedure. 

## 2021-04-18 ENCOUNTER — Telehealth: Payer: Self-pay | Admitting: *Deleted

## 2021-04-18 NOTE — Telephone Encounter (Signed)
Post SCS trial call;  patient is doing well, communicating with Adam/ [employer] representative.  Things are going well, able to do more activity than he has done in a while. Will call if anything arises.

## 2021-04-23 ENCOUNTER — Encounter: Payer: Self-pay | Admitting: Student in an Organized Health Care Education/Training Program

## 2021-04-23 ENCOUNTER — Ambulatory Visit: Admitting: Student in an Organized Health Care Education/Training Program

## 2021-04-23 ENCOUNTER — Ambulatory Visit (HOSPITAL_BASED_OUTPATIENT_CLINIC_OR_DEPARTMENT_OTHER): Admitting: Student in an Organized Health Care Education/Training Program

## 2021-04-23 ENCOUNTER — Ambulatory Visit
Admission: RE | Admit: 2021-04-23 | Discharge: 2021-04-23 | Disposition: A | Discharge status: Home / Self Care | Source: Ambulatory Visit | Attending: Student in an Organized Health Care Education/Training Program | Admitting: Student in an Organized Health Care Education/Training Program

## 2021-04-23 ENCOUNTER — Ambulatory Visit

## 2021-04-23 ENCOUNTER — Other Ambulatory Visit: Payer: Self-pay

## 2021-04-23 VITALS — BP 110/64 | HR 71 | Temp 97.1°F | Resp 16 | Ht 67.0 in | Wt 220.0 lb

## 2021-04-23 DIAGNOSIS — M5416 Radiculopathy, lumbar region: Secondary | ICD-10-CM

## 2021-04-23 DIAGNOSIS — G8929 Other chronic pain: Secondary | ICD-10-CM

## 2021-04-23 DIAGNOSIS — M961 Postlaminectomy syndrome, not elsewhere classified: Secondary | ICD-10-CM

## 2021-04-23 NOTE — Progress Notes (Signed)
1101 Dr. Cherylann Ratel here for lead pull along with Adam from [employer]. Leads pulled under fluoroscopy. Patient tolerated well. Leads intact in their entirety. Site clear.

## 2021-04-23 NOTE — Progress Notes (Signed)
PROVIDER NOTE: Information contained herein reflects review and annotations entered in association with encounter. Interpretation of such information and data should be left to medically-trained personnel. Information provided to patient can be located elsewhere in the medical record under "Patient Instructions". Document created using STT-dictation technology, any transcriptional errors that may result from process are unintentional.    Patient: Jonathon Newman  Service Category: E/M  Provider: Gillis Santa, MD  DOB: 28-Jul-1955  DOS: 04/23/2021  Specialty: Interventional Pain Management  MRN: [id number]  Setting: Ambulatory outpatient  PCP: Gwyneth Sprout, FNP  Type: Established Patient    Referring Provider: Gwyneth Sprout, FNP  Location: Office  Delivery: Face-to-face     HPI  [name], a 65 y.o. year old male, is here today because of his Failed back surgical syndrome [M96.1]. [name] primary complain today is Back Pain (lower) Last encounter: My last encounter with him was on 04/16/2021. Pain Assessment: Severity of Chronic pain is reported as a 2 /10. Location: Back  /denies. Onset: More than a month ago. Quality: Aching. Timing: Constant. Modifying factor(s): rest, SCS. Vitals:  height is 5' 7" (1.702 m) and weight is 220 lb (99.8 kg). His temporal temperature is 97.1 F (36.2 C) (abnormal). His blood pressure is 110/64 and his pulse is 71. His respiration is 16 and oxygen saturation is 99%.   Reason for encounter:  Medtronic spinal cord stimulator trial lead removal    Jonathon Newman follows up today for spinal cord stimulator trial lead removal.  He had a successful spinal cord stimulator trial and endorses greater than 80% pain relief over the last 7 days and improvement in functional status.  He experienced less pain with ambulation.  Trial leads were removed under live fluoroscopy.  There was no evidence of lead migration.  Trial lead tips were intact.  Referral to Dr. Lacinda Axon for  percutaneous permanent implant.   Jonathon Rochester, RN  04/23/2021 11:03 AM  Sign when Signing Visit 1101 Dr. Holley Raring here for lead pull along with Adam from [place]. Leads pulled under fluoroscopy. Patient tolerated well. Leads intact in their entirety. Site clear.       ROS  Constitutional: Denies any fever or chills Gastrointestinal: No reported hemesis, hematochezia, vomiting, or acute GI distress Musculoskeletal:  Improved low back and leg pain Neurological: No reported episodes of acute onset apraxia, aphasia, dysarthria, agnosia, amnesia, paralysis, loss of coordination, or loss of consciousness  Medication Review  Dulaglutide, FreeStyle Lite, allopurinol, amLODipine, aspirin, atorvastatin, carvedilol, cephALEXin, cyclobenzaprine, ezetimibe, freestyle, glucose blood, isosorbide mononitrate, losartan, meloxicam, metFORMIN, multivitamin with minerals, nitroGLYCERIN, pantoprazole, potassium chloride, pregabalin, senna, and torsemide  History Review  Allergy: [name] is allergic to colchicine and lisinopril. Drug: [name]  reports no history of drug use. Alcohol:  reports no history of alcohol use. Tobacco:  reports that he quit smoking about 15 years ago. His smoking use included cigarettes. He has a 0.50 pack-year smoking history. He has quit using smokeless tobacco.  His smokeless tobacco use included chew. Social: [name]  reports that he quit smoking about 15 years ago. His smoking use included cigarettes. He has a 0.50 pack-year smoking history. He has quit using smokeless tobacco.  His smokeless tobacco use included chew. He reports that he does not drink alcohol and does not use drugs. Medical:  has a past medical history of Arthritis, CHF (congestive heart failure) ([place]), Coronary artery disease, Diabetes mellitus without complication ([place]), Dyspnea, GERD (gastroesophageal reflux disease), Hypercholesteremia, Hypertension, S/P coronary  artery bypass graft x 2,  and Sleep apnea. Surgical: Jonathon Newman  has a past surgical history that includes Appendectomy; Cholecystectomy; Knee surgery; Cardiac catheterization; Coronary stent placement (2010); Coronary artery bypass graft (04/21/2015); Vasectomy; Knee surgery; Cholecystectomy; Carpal tunnel release; Fracture surgery; Esophageal manometry (N/A, 08/04/2017); Back surgery (11/01/2017); Laminectomy; RIGHT/LEFT HEART CATH AND CORONARY ANGIOGRAPHY (Bilateral, 05/02/2018); and LEFT HEART CATH AND CORS/GRAFTS ANGIOGRAPHY (N/A, 05/02/2018). Family: family history includes Arthritis in his mother; Cancer in his maternal grandfather; Cataracts in his maternal grandmother; Glaucoma in his maternal grandmother; Heart attack in his paternal grandfather; Heart attack (age of onset: 25) in his father; Heart murmur in his brother; Hyperlipidemia in his father; Hypertension in his father and paternal grandfather; Valvular heart disease in his brother.  Laboratory Chemistry Profile   Renal Lab Results  Component Value Date   BUN 45 (H) 02/14/2021   CREATININE 1.55 (H) 02/14/2021   BCR 29 (H) 02/14/2021   GFRAA 72 06/28/2020   GFRNONAA 54 (L) 02/14/2021    Hepatic Lab Results  Component Value Date   AST 183 (H) 02/10/2021   ALT 299 (H) 02/10/2021   ALBUMIN 4.6 02/10/2021   ALKPHOS 178 (H) 02/10/2021   LIPASE 35 08/26/2015    Electrolytes Lab Results  Component Value Date   NA 140 02/14/2021   K 5.1 02/14/2021   CL 96 02/14/2021   CALCIUM 9.7 02/14/2021    Bone No results found for: VD25OH, VD125OH2TOT, BS9628ZM6, QH4765YY5, 25OHVITD1, 25OHVITD2, 25OHVITD3, TESTOFREE, TESTOSTERONE  Inflammation (CRP: Acute Phase) (ESR: Chronic Phase) No results found for: CRP, ESRSEDRATE, LATICACIDVEN       Note: Above Lab results reviewed.   Physical Exam  General appearance: Well nourished, well developed, and well hydrated. In no apparent acute distress Mental status: Alert, oriented x 3 (person, place, & time)        Respiratory: No evidence of acute respiratory distress Eyes: PERLA Vitals: BP 110/64 (BP Location: Right Arm, Patient Position: Sitting, Cuff Size: Large)   Pulse 71   Temp (!) 97.1 F (36.2 C) (Temporal)   Resp 16   Ht 5' 7" (1.702 m)   Wt 220 lb (99.8 kg)   SpO2 99%   BMI 34.46 kg/m  BMI: Estimated body mass index is 34.46 kg/m as calculated from the following:   Height as of this encounter: 5' 7" (1.702 m).   Weight as of this encounter: 220 lb (99.8 kg). Ideal: Ideal body weight: 66.1 kg (145 lb 11.6 oz) Adjusted ideal body weight: 79.6 kg (175 lb 6.9 oz)  Improved low back and leg pain Spinal cord stimulator trial leads removed under live fluoroscopy with tips intact.  Assessment   Status Diagnosis  Controlled Controlled Controlled 1. Failed back surgical syndrome   2. Post laminectomy syndrome   3. Chronic radicular lumbar pain        Plan of Care  Given successful [employer] spinal cord stimulator trial, will refer patient to Dr. Lacinda Axon for discussion of percutaneous permanent implant with [employer].  Orders:  Orders Placed This Encounter  Procedures   DG PAIN CLINIC C-ARM 1-60 MIN NO REPORT    Intraoperative interpretation by procedural physician at [facility].    Standing Status:   Standing    Number of Occurrences:   1    Order Specific Question:   Reason for exam:    Answer:   Assistance in needle guidance and placement for procedures requiring needle placement in or near specific anatomical locations not easily accessible  without such assistance.   Ambulatory referral to Neurosurgery    Referral Priority:   Routine    Referral Type:   Surgical    Referral Reason:   Specialty Services Required    Referred to Provider:   Deetta Perla, MD    Number of Visits Requested:   1   Follow-up plan:   Return if symptoms worsen or fail to improve.    Recent Visits Date Type Provider Dept  04/16/21 Procedure visit Gillis Santa, MD [facility]  03/26/21 Office Visit Gillis Santa, MD [facility]  Showing recent visits within past 90 days and meeting all other requirements Today's Visits Date Type Provider Dept  04/23/21 Office Visit Gillis Santa, MD [facility]  Showing today's visits and meeting all other requirements Future Appointments No visits were found meeting these conditions. Showing future appointments within next 90 days and meeting all other requirements I discussed the assessment and treatment plan with the patient. The patient was provided an opportunity to ask questions and all were answered. The patient agreed with the plan and demonstrated an understanding of the instructions.  Patient advised to call back or seek an in-person evaluation if the symptoms or condition worsens.  Duration of encounter: 30 minutes.  Note by: Gillis Santa, MD Date: 04/23/2021; Time: 1:26 PM

## 2021-04-29 ENCOUNTER — Other Ambulatory Visit: Payer: Self-pay | Admitting: Neurosurgery

## 2021-05-07 ENCOUNTER — Encounter: Payer: Self-pay | Admitting: Cardiovascular Disease

## 2021-05-07 ENCOUNTER — Other Ambulatory Visit: Payer: Self-pay

## 2021-05-07 ENCOUNTER — Ambulatory Visit (INDEPENDENT_AMBULATORY_CARE_PROVIDER_SITE_OTHER): Admitting: Cardiovascular Disease

## 2021-05-07 VITALS — BP 130/78 | HR 71 | Ht 67.0 in | Wt 224.0 lb

## 2021-05-07 DIAGNOSIS — E785 Hyperlipidemia, unspecified: Secondary | ICD-10-CM | POA: Diagnosis not present

## 2021-05-07 DIAGNOSIS — I5032 Chronic diastolic (congestive) heart failure: Secondary | ICD-10-CM

## 2021-05-07 DIAGNOSIS — I10 Essential (primary) hypertension: Secondary | ICD-10-CM

## 2021-05-07 DIAGNOSIS — I25118 Atherosclerotic heart disease of native coronary artery with other forms of angina pectoris: Secondary | ICD-10-CM | POA: Diagnosis not present

## 2021-05-07 DIAGNOSIS — E1151 Type 2 diabetes mellitus with diabetic peripheral angiopathy without gangrene: Secondary | ICD-10-CM

## 2021-05-07 MED ORDER — EZETIMIBE 10 MG PO TABS: 10.0000 mg | ORAL_TABLET | Freq: Every day | ORAL | 3 refills | Status: DC

## 2021-05-07 MED ORDER — LOSARTAN POTASSIUM 100 MG PO TABS: ORAL_TABLET | ORAL | 3 refills | Status: DC

## 2021-05-07 MED ORDER — ATORVASTATIN CALCIUM 40 MG PO TABS: 40.0000 mg | ORAL_TABLET | Freq: Every day | ORAL | 3 refills | Status: DC

## 2021-05-07 MED ORDER — CARVEDILOL 25 MG PO TABS: 25.0000 mg | ORAL_TABLET | Freq: Two times a day (BID) | ORAL | 3 refills | Status: DC

## 2021-05-07 MED ORDER — ISOSORBIDE MONONITRATE ER 60 MG PO TB24: 60.0000 mg | ORAL_TABLET | Freq: Every day | ORAL | 3 refills | Status: DC

## 2021-05-07 MED ORDER — AMLODIPINE BESYLATE 5 MG PO TABS: 5.0000 mg | ORAL_TABLET | Freq: Two times a day (BID) | ORAL | 3 refills | Status: DC

## 2021-05-07 MED ORDER — POTASSIUM CHLORIDE ER 10 MEQ PO TBCR: 10.0000 meq | EXTENDED_RELEASE_TABLET | Freq: Two times a day (BID) | ORAL | 3 refills | Status: DC

## 2021-05-07 MED ORDER — TORSEMIDE 20 MG PO TABS: 40.0000 mg | ORAL_TABLET | Freq: Two times a day (BID) | ORAL | 3 refills | Status: DC

## 2021-05-07 NOTE — Patient Instructions (Addendum)
Medication Instructions:  No changes  If you need a refill on your cardiac medications before your next appointment, please call your pharmacy.    Lab work: No new labs needed   Testing/Procedures: No new testing needed   Follow-Up: At [facility], you and your health needs are our priority.  As part of our continuing mission to provide you with exceptional heart care, we have created designated Provider Care Teams.  These Care Teams include your primary Cardiologist (physician) and Advanced Practice Providers (APPs -  Physician Assistants and Nurse Practitioners) who all work together to provide you with the care you need, when you need it.  You will need a follow up appointment in 6 months  Providers on your designated Care Team:   Christopher Berge, NP Ryan Dunn, PA-C Cadence Furth, PA-C  COVID-19 Vaccine Information can be found at: [email or web address] For questions related to vaccine distribution or appointments, please email [email or web address] or call [phone].   

## 2021-05-07 NOTE — Progress Notes (Signed)
Cardiology Office Note  Date:  05/07/2021   ID:  Jonathon Newman, DOB [birth year], MRN [id number]  PCP:  Jacky Kindle, FNP   Chief Complaint  Patient presents with   6 month follow up     Patient c/o LE edema last week but some better today. Medications reviewed by the patient verbally.     HPI:  Jonathon Newman is a 65 year old gentleman with past medical history of coronary artery disease  chronic diastolic heart failure.   Cypher drug-eluting stent placement to the RCA in 2010.    two-vessel coronary artery bypass graft in 2016 at [facility].   Hyperlipidemia Chronic dizziness date back to 2019 Chronic shortness of breath dating back several years OSA, wears CPAP Diabetes type 2 Who presents for follow-up of his chronic diastolic CHF, CAD  Last clinic visit March 2022 At that time was having difficulty swallowing, referral made to GI Had barium swallow and follow-up  Back surgery, foot drop Spinal cord stimulator December 2022 Took out temp stimulator, 05/26/21 scheduled for permanent stimulator Walking with cane  GERD sx better on better PPI  Had edema last week,  Better now, Torsemide 40 BID  Acute renal failure 02/14/21, , above baseline Has labs next week  EKG personally reviewed by myself on todays visit Nsr rate 71 bpm, no ST or T wave changes  Other past medical hx reviewed Chronic SOB, rare chest and jaw pain, Chronic dizziness, dating back several years  stress test was ordered Oct 18, 2019  low risk study no significant ischemia ejection fraction 52%  Still working, in Education officer, environmental, desk job Active in the yard, weekends  cardiac catheterization November 2019  medical management was recommended  having symptoms of chest discomfort at the time  diagnostic R/LHC on 05/02/2018 that showed significant underlying left main disease with patent grafts including LIMA to LAD and SVG to OM2. The RCA stent was patent with minimal restenosis. He had normal LVSF  with a mildly elevated LVEDP at 14-15 mmHg.   He is wearing his C-PAP.  PMH:   has a past medical history of Arthritis, CHF (congestive heart failure) (HCC), Coronary artery disease, Diabetes mellitus without complication (HCC), Dyspnea, GERD (gastroesophageal reflux disease), Hypercholesteremia, Hypertension, S/P coronary artery bypass graft x 2, and Sleep apnea.  PSH:    Past Surgical History:  Procedure Laterality Date   APPENDECTOMY     BACK SURGERY  11/01/2017   SL 5 and S1   CARDIAC CATHETERIZATION     CARPAL TUNNEL RELEASE     left hand   CHOLECYSTECTOMY     CHOLECYSTECTOMY     CORONARY ARTERY BYPASS GRAFT  04/21/2015   CABG x 2 Hecker V.A. LIMA to LAD amd SVG to OM2   CORONARY STENT PLACEMENT  2010   Cordis Cypher Sirolimus-eluting stent 2.50 mm x 26 mm placed to the RCA at [facility]    ESOPHAGEAL MANOMETRY N/A 08/04/2017   Procedure: ESOPHAGEAL MANOMETRY (EM);  Surgeon: Toney Reil, MD;  Location: [facility] ENDOSCOPY;  Service: Endoscopy;  Laterality: N/A;   FRACTURE SURGERY     KNEE SURGERY     KNEE SURGERY     right knee    LAMINECTOMY     "plate in neck J6-B3"   LEFT HEART CATH AND CORS/GRAFTS ANGIOGRAPHY N/A 05/02/2018   Procedure: CORS/GRAFTS ANGIOGRAPHY;  Surgeon: Iran Ouch, MD;  Location: [facility] INVASIVE CV LAB;  Service: Cardiovascular;  Laterality: N/A;   RIGHT/LEFT HEART CATH  AND CORONARY ANGIOGRAPHY Bilateral 05/02/2018   Procedure: LEFT HEART CATH;  Surgeon: Iran Ouch, MD;  Location: [facility] INVASIVE CV LAB;  Service: Cardiovascular;  Laterality: Bilateral;   VASECTOMY      Current Outpatient Medications  Medication Sig Dispense Refill   allopurinol (ZYLOPRIM) 300 MG tablet Take 1 tablet (300 mg total) by mouth daily. 90 tablet 3   amLODipine (NORVASC) 5 MG tablet TAKE ONE TABLET BY MOUTH TWICE A DAY 180 tablet 0   aspirin 81 MG tablet Take 81 mg by mouth daily.     atorvastatin (LIPITOR) 40 MG tablet TAKE ONE TABLET BY  MOUTH DAILY 90 tablet 0   Blood Glucose Monitoring Suppl (FREESTYLE LITE) DEVI To check blood sugar once daily 1 each 0   carvedilol (COREG) 25 MG tablet TAKE ONE TABLET BY MOUTH TWICE A DAY WITH MEALS 60 tablet 0   cyclobenzaprine (FLEXERIL) 10 MG tablet TAKE ONE TABLET BY MOUTH THREE TIMES A DAY AS NEEDED FOR MUSCLE SPASMS 90 tablet 1   Dulaglutide (TRULICITY) 1.5 MG/0.5ML SOPN Inject 1.5 mg into the skin once a week. 6 mL 1   ezetimibe (ZETIA) 10 MG tablet TAKE ONE TABLET BY MOUTH DAILY 90 tablet 0   glucose blood (FREESTYLE LITE) test strip USE TO CHECK BLOOD SUGAR ONCE DAILY 100 strip 4   isosorbide mononitrate (IMDUR) 60 MG 24 hr tablet Take 1 tablet (60 mg total) by mouth daily. PLEASE CALL TO SCHEDULE OFFICE VISIT FOR FURTHER REFILLS. THANK YOU! 90 tablet 0   Lancets (FREESTYLE) lancets USE TO CHECK BLOOD SUGAR ONCE DAILY 100 each 4   losartan (COZAAR) 100 MG tablet TAKE ONE TABLET MY MOUTH DAILY 90 tablet 0   meloxicam (MOBIC) 15 MG tablet Take 1 tablet (15 mg total) by mouth daily. 90 tablet 0   metFORMIN (GLUCOPHAGE-XR) 500 MG 24 hr tablet TAKE TWO TABLETS BY MOUTH TWICE A DAY WITH A MEAL 360 tablet 0   Multiple Vitamin (MULTIVITAMIN WITH MINERALS) TABS tablet Take 1 tablet by mouth daily.     naproxen sodium (ALEVE) 220 MG tablet Take 440 mg by mouth daily as needed (pain).     nitroGLYCERIN (NITROSTAT) 0.4 MG SL tablet Place 1 tablet (0.4 mg total) under the tongue every 5 (five) minutes as needed for chest pain. 25 tablet 3   pantoprazole (PROTONIX) 40 MG tablet Take 40 mg by mouth daily.     potassium chloride (KLOR-CON) 10 MEQ tablet Take 10 mEq by mouth 2 (two) times daily.     potassium chloride (KLOR-CON) 10 MEQ tablet Take 10 mEq by mouth 2 (two) times daily.     pregabalin (LYRICA) 150 MG capsule TAKE 1 CAPSULE BY MOUTH TWO TIMES A DAY 180 capsule 0   senna (SENOKOT) 8.6 MG TABS tablet Take 1 tablet by mouth daily.     torsemide (DEMADEX) 20 MG tablet TAKE TWO TABLETS BY  MOUTH TWICE A DAY 120 tablet 0   No current facility-administered medications for this visit.    Allergies:   Colchicine and Lisinopril   Social History:  The patient  reports that he quit smoking about 15 years ago. His smoking use included cigarettes. He has a 0.50 pack-year smoking history. He has quit using smokeless tobacco.  His smokeless tobacco use included chew. He reports that he does not drink alcohol and does not use drugs.   Family History:   family history includes Arthritis in his mother; Cancer in his maternal grandfather; Cataracts in his  maternal grandmother; Glaucoma in his maternal grandmother; Heart attack in his paternal grandfather; Heart attack (age of onset: 7) in his father; Heart murmur in his brother; Hyperlipidemia in his father; Hypertension in his father and paternal grandfather; Valvular heart disease in his brother.    Review of Systems: Review of Systems  Constitutional: Negative.   HENT: Negative.    Respiratory: Negative.    Cardiovascular: Negative.   Gastrointestinal:  Positive for heartburn.  Musculoskeletal: Negative.   Neurological:  Positive for dizziness.  Psychiatric/Behavioral: Negative.    All other systems reviewed and are negative.   PHYSICAL EXAM: VS:  BP 130/78 (BP Location: Left Arm, Patient Position: Sitting, Cuff Size: Normal)   Pulse 71   Ht 5\' 7"  (1.702 m)   Wt 224 lb (101.6 kg)   SpO2 98%   BMI 35.08 kg/m  , BMI Body mass index is 35.08 kg/m. Constitutional:  oriented to person, place, and time. No distress.  HENT:  Head: Grossly normal Eyes:  no discharge. No scleral icterus.  Neck: No JVD, no carotid bruits  Cardiovascular: Regular rate and rhythm, no murmurs appreciated Pulmonary/Chest: Clear to auscultation bilaterally, no wheezes or rails Abdominal: Soft.  no distension.  no tenderness.  Musculoskeletal: Normal range of motion Neurological:  normal muscle tone. Coordination normal. No atrophy Skin: Skin warm and  dry Psychiatric: normal affect, pleasant   Recent Labs: 02/10/2021: ALT 299 02/14/2021: BUN 45; Creatinine, Ser 1.55; Hemoglobin 15.1; Platelets 220; Potassium 5.1; Sodium 140    Lipid Panel Lab Results  Component Value Date   CHOL 146 12/27/2020   HDL 25 (L) 12/27/2020   LDLCALC 22 12/27/2020   TRIG 775 (HH) 12/27/2020      Wt Readings from Last 3 Encounters:  05/07/21 224 lb (101.6 kg)  04/23/21 220 lb (99.8 kg)  04/16/21 215 lb (97.5 kg)      ASSESSMENT AND PLAN:  Acute on chronic diastolic congestive heart failure (HCC) -  On torsemide 40 BID Labs next week CR high in 02/2021  GERD Continue on PPI  Coronary artery disease involving native coronary artery of native heart with angina pectoris (HCC) Chronic angina, Has had prior catheterization, stress test low risk stable Currently with no symptoms of angina. No further workup at this time. Continue current medication regimen. -reports needs to come off asa (no exceptions per patient) No further testing needed before procedure, acceptable risk  S/P coronary artery bypass graft x 2 - Plan: EKG 12-Lead Stress test as above, low risk Prior cardiac catheterization No unstable angina  Hyperlipidemia, unspecified hyperlipidemia type Continue Lipitor 40 daily,  goal LDL less than 70 No changes  Essential hypertension Blood pressure is well controlled on today's visit. No changes made to the medications.  Chronic dizziness Prior ENT work-up in the past  Prior MRI head 2017 no acute findings  Diabetes type 2 A1C getting better 9 to 6 range    Total encounter time more than 35 minutes  Greater than 50% was spent in counseling and coordination of care with the patient     Orders Placed This Encounter  Procedures   EKG 12-Lead     Signed, 2018, M.D., Ph.D. 05/07/2021  [facility] [place], [place]

## 2021-05-12 ENCOUNTER — Encounter
Admission: RE | Admit: 2021-05-12 | Discharge: 2021-05-12 | Disposition: A | Discharge status: Home / Self Care | Source: Ambulatory Visit | Attending: Neurosurgery | Admitting: Neurosurgery

## 2021-05-12 ENCOUNTER — Telehealth: Payer: Self-pay | Admitting: *Deleted

## 2021-05-12 ENCOUNTER — Other Ambulatory Visit: Payer: Self-pay

## 2021-05-12 DIAGNOSIS — Z01818 Encounter for other preprocedural examination: Secondary | ICD-10-CM | POA: Diagnosis not present

## 2021-05-12 DIAGNOSIS — Z01810 Encounter for preprocedural cardiovascular examination: Secondary | ICD-10-CM

## 2021-05-12 LAB — URINALYSIS, ROUTINE W REFLEX MICROSCOPIC
Bacteria, UA: NONE SEEN
Bilirubin Urine: NEGATIVE
Glucose, UA: NEGATIVE mg/dL
Hgb urine dipstick: NEGATIVE
Ketones, ur: NEGATIVE mg/dL
Leukocytes,Ua: NEGATIVE
Nitrite: NEGATIVE
Protein, ur: NEGATIVE mg/dL
Specific Gravity, Urine: 1.015 (ref 1.005–1.030)
Squamous Epithelial / HPF: NONE SEEN (ref 0–5)
pH: 6 (ref 5.0–8.0)

## 2021-05-12 LAB — BASIC METABOLIC PANEL
Anion gap: 10 (ref 5–15)
BUN: 27 mg/dL — ABNORMAL HIGH (ref 8–23)
CO2: 25 mmol/L (ref 22–32)
Calcium: 9.3 mg/dL (ref 8.9–10.3)
Chloride: 103 mmol/L (ref 98–111)
Creatinine, Ser: 1.28 mg/dL — ABNORMAL HIGH (ref 0.61–1.24)
GFR, Estimated: >60 mL/min (ref >60)
Glucose, Bld: 150 mg/dL — ABNORMAL HIGH (ref 70–99)
Potassium: 3.9 mmol/L (ref 3.5–5.1)
Sodium: 138 mmol/L (ref 135–145)

## 2021-05-12 LAB — TYPE AND SCREEN
ABO/RH(D): O POS
Antibody Screen: NEGATIVE

## 2021-05-12 LAB — PROTIME-INR
INR: 1 (ref 0.8–1.2)
Prothrombin Time: 13.3 seconds (ref 11.4–15.2)

## 2021-05-12 LAB — CBC
HCT: 36.9 % — ABNORMAL LOW (ref 39.0–52.0)
Hemoglobin: 12.7 g/dL — ABNORMAL LOW (ref 13.0–17.0)
MCH: 31 pg (ref 26.0–34.0)
MCHC: 34.4 g/dL (ref 30.0–36.0)
MCV: 90 fL (ref 80.0–100.0)
Platelets: 176 10*3/uL (ref 150–400)
RBC: 4.1 MIL/uL — ABNORMAL LOW (ref 4.22–5.81)
RDW: 13.9 % (ref 11.5–15.5)
WBC: 5.8 10*3/uL (ref 4.0–10.5)
nRBC: 0 % (ref 0.0–0.2)

## 2021-05-12 LAB — SURGICAL PCR SCREEN
MRSA, PCR: NEGATIVE
Staphylococcus aureus: NEGATIVE

## 2021-05-12 LAB — APTT: aPTT: 32 seconds (ref 24–36)

## 2021-05-12 NOTE — Telephone Encounter (Signed)
   Name: Jonathon Newman  DOB: 1955-11-07  MRN: [id number]   Primary Cardiologist: Julien Nordmann, MD  Chart reviewed as part of pre-operative protocol coverage.  Pt was seen by Dr. Mariah Milling 05/07/21 and cleared for thoracic spinal cord stimulator and pulse generator change out with ASA hold. Given his history of CAD and CABG, prefer to minimize his time off of ASA.   I will route this recommendation to the requesting party via Epic fax function and remove from pre-op pool. Please call with questions.  Roe Rutherford Honest Vanleer, PA 05/12/2021, 3:04 PM

## 2021-05-12 NOTE — Patient Instructions (Addendum)
Your procedure is scheduled on: 05/26/2021  Report to the Registration Desk on the 1st floor of the Medical Mall. To find out your arrival time, please call [phone] between 1PM - 3PM on: December 9,2022   REMEMBER: Instructions that are not followed completely may result in serious medical risk, up to and including death; or upon the discretion of your surgeon and anesthesiologist your surgery may need to be rescheduled.  Do not eat food after midnight the night before surgery.  No gum chewing, lozengers or hard candies.  You may however,  drink water up to 2 hours before you are scheduled to arrive for your surgery. Do not drink anything within 2 hours of your scheduled arrival time.  Clear liquids include: - water   Type 1 and Type 2 diabetics should only drink water.   TAKE THESE MEDICATIONS THE MORNING OF SURGERY WITH A SIP OF WATER:  pantoprazole (PROTONIX) take one the night before and one on the morning of surgery - helps to prevent nausea after surgery.) 2. Allopurinol 3. Amlodipine 4. Atorvastatin 5. Carvedilol 6. Cyclobenzaprine 7. Zetia 8. Isosorbide mononitrate 9. Nitrostat as needed 10. Pregabalin 11. Potassium chloride         DO NOT TAKE MEDICATIONS NOT ON THIS LIST   **Follow new guidelines for insulin and diabetes medications.**        Stop metformin 2 days prior to surgery so your last dose will be Dec. 02/2021   Follow recommendations from Cardiologist, Pulmonologist or PCP regarding stopping Aspirin and mobic  One week prior to surgery: Stop Anti-inflammatories (NSAIDS) such as Advil, Aleve, Ibuprofen, Motrin, Naproxen, Naprosyn and Aspirin based products such as Excedrin, Goodys Powder, BC Powder and mobic Stop ANY OVER THE COUNTER supplements until after surgery like senna and multivitamins You may however, continue to take Tylenol if needed for pain up until the day of surgery.  No Alcohol for 24 hours before or after surgery.  No Smoking  including e-cigarettes for 24 hours prior to surgery.  No chewable tobacco products for at least 6 hours prior to surgery.  No nicotine patches on the day of surgery.  Do not use any "recreational" drugs for at least a week prior to your surgery.  Please be advised that the combination of cocaine and anesthesia may have negative outcomes, up to and including death. If you test positive for cocaine, your surgery will be cancelled.  On the morning of surgery brush your teeth with toothpaste and water, you may rinse your mouth with mouthwash if you wish. Do not swallow any toothpaste or mouthwash.  Use CHG Soap or wipes as directed on instruction sheet.  Do not wear jewelry, make-up, hairpins, clips or nail polish.  Do not wear lotions, powders, or perfumes.   Do not shave body from the neck down 48 hours prior to surgery just in case you cut yourself which could leave a site for infection.  Also, freshly shaved skin may become irritated if using the CHG soap.  Contact lenses, hearing aids and dentures may not be worn into surgery.  Do not bring valuables to the hospital. [facility] is not responsible for any missing/lost belongings or valuables.     Bring your C-PAP to the hospital and leave in the car. in case you may have to spend the night.   Notify your doctor if there is any change in your medical condition (cold, fever, infection).  Wear comfortable clothing (specific to your surgery type) to the  hospital.  After surgery, you can help prevent lung complications by doing breathing exercises.  Take deep breaths and cough every 1-2 hours. Your doctor may order a device called an Incentive Spirometer to help you take deep breaths.  If you are being admitted to the hospital overnight, leave your suitcase in the car. After surgery it may be brought to your room.  If you are being discharged the day of surgery, you will not be allowed to drive home. You will need a responsible  adult (18 years or older) to drive you home and stay with you that night.   If you are taking public transportation, you will need to have a responsible adult (18 years or older) with you. Please confirm with your physician that it is acceptable to use public transportation.   Please call the Pre-admissions Testing Dept. at [phone] if you have any questions about these instructions.  Surgery Visitation Policy:  Patients undergoing a surgery or procedure may have one family member or support person with them as long as that person is not COVID-19 positive or experiencing its symptoms.  That person may remain in the waiting area during the procedure and may rotate out with other people.  Inpatient Visitation:    Visiting hours are 7 a.m. to 8 p.m. Up to two visitors ages 16+ are allowed at one time in a patient room. The visitors may rotate out with other people during the day. Visitors must check out when they leave, or other visitors will not be allowed. One designated support person may remain overnight. The visitor must pass COVID-19 screenings, use hand sanitizer when entering and exiting the patient's room and wear a mask at all times, including in the patient's room. Patients must also wear a mask when staff or their visitor are in the room. Masking is required regardless of vaccination status.

## 2021-05-12 NOTE — Telephone Encounter (Signed)
Request for pre-operative cardiac clearance Received: Today Karen Kitchens, NP  P Cv Div Preop Callback Request for pre-operative cardiac clearance:     1. What type of surgery is being performed?  THORACIC SPINAL CORD STIMULATOR AND PULSE GENERATOR PLACEMENT   2. When is this surgery scheduled?  05/26/2021     3. Are there any medications that need to be held prior to surgery?  ASA   4. Practice name and name of physician performing surgery?  Performing surgeon: Dr. Deetta Perla, MD  Requesting clearance: Honor Loh, FNP-C       5. Anesthesia type (none, local, MAC, general)? General   6. What is the office phone and fax number?    Phone: [phone]  Fax: [phone]   ATTENTION: Unable to create telephone message as per your standard workflow. Directed by [facility] providers to send requests for cardiac clearance to this pool for appropriate distribution to provider covering pre-operative clearances.   Honor Loh, MSN, APRN, FNP-C, CEN  [facility]  Peri-operative Services Nurse Practitioner  Phone: [phone]  05/12/21 12:46 PM

## 2021-05-13 ENCOUNTER — Other Ambulatory Visit: Payer: Self-pay | Admitting: Family Medicine

## 2021-05-13 ENCOUNTER — Encounter: Payer: Self-pay | Admitting: Cardiovascular Disease

## 2021-05-14 NOTE — Telephone Encounter (Signed)
Requested Prescriptions  Pending Prescriptions Disp Refills  . TRULICITY 1.5 MG/0.5ML SOPN [Pharmacy Med Name: TRULICITY SD PEN 0.5ML 4'S 1.5MG  1.5MG ] 6 mL 3    Sig: INJECT 1.5 MG UNDER THE SKIN ONCE A WEEK     Endocrinology:  Diabetes - GLP-1 Receptor Agonists Passed - 05/13/2021  2:05 AM      Passed - HBA1C is between 0 and 7.9 and within 180 days    Hgb A1c MFr Bld  Date Value Ref Range Status  02/14/2021 6.4 (H) 4.8 - 5.6 % Final    Comment:             Prediabetes: 5.7 - 6.4          Diabetes: >6.4          Glycemic control for adults with diabetes: <7.0          Passed - Valid encounter within last 6 months    Recent Outpatient Visits          1 month ago History of recent fall   [facility] Merita Norton T, FNP   4 months ago Type 2 diabetes mellitus with diabetic peripheral angiopathy without gangrene, without long-term current use of insulin [facility])   [facility] [place], Marzella Schlein, MD   5 months ago Type 2 diabetes mellitus with hyperosmolarity without coma, without long-term current use of insulin [facility])   [facility] Chrismon, Jodell Cipro, PA-C   6 months ago Lumbar radiculopathy   [facility], Jodell Cipro, PA-C   7 months ago Type 2 diabetes mellitus with diabetic peripheral angiopathy without gangrene, without long-term current use of insulin (HCC)   [facility] [place], Eula Fried, FNP      Future Appointments            In 5 months Gollan, Tollie Pizza, MD [facility]

## 2021-05-19 ENCOUNTER — Encounter: Payer: Self-pay | Admitting: Neurosurgery

## 2021-05-19 NOTE — Progress Notes (Signed)
Perioperative Services  Pre-Admission/Anesthesia Testing Clinical Review  Date: 05/19/21  Patient Demographics:  Name: Airrion Otting DOB:   1955-11-02 MRN:   [id number]  Planned Surgical Procedure(s):    Case: [id number] Date/Time: 05/26/21 1042   Procedure: THORACIC SPINAL CORD STIMULATOR AND PULSE GENERATOR PLACEMENT (MEDTRONIC)   Anesthesia type: General   Pre-op diagnosis:      Chronic pain g89.4     Postlaminectomy syndrome M96.1   Location: [facility] OR ROOM 03 / [facility]   Surgeons: Lucy Chris, MD   NOTE: Available PAT nursing documentation and vital signs have been reviewed. Clinical nursing staff has updated patient's PMH/PSHx, current medication list, and drug allergies/intolerances to ensure comprehensive history available to assist in medical decision making as it pertains to the aforementioned surgical procedure and anticipated anesthetic course. Extensive review of available clinical information performed. [place] [facility] and PSHx updated with any diagnoses/procedures that  may have been inadvertently omitted during his intake with the pre-admission testing department's nursing staff.  Clinical Discussion:  Cavan Bearden is a 65 y.o. male who is submitted for pre-surgical anesthesia review and clearance prior to him undergoing the above procedure. Patient is a Former Smoker (0.5 pack years; quit 05/2005). Pertinent PMH includes: CAD (s/p CABG), NSTEMI, anginal pain, CHF, HTN, HLD, T2DM, OSAH (requires nocturnal PAP therapy), GERD (on daily PPI), OA, lumbar DDD, postlaminectomy syndrome, LEFT foot drop, BPH.  Patient is followed by cardiology Mariah Milling, MD). He was last seen in the cardiology clinic on 05/07/2021; notes reviewed.  At the time of his clinic visit, patient doing well overall from a cardiovascular perspective.  Patient denied any episodes of chest pain, however had chronic dyspnea which was reported to be at baseline.  Patient denied any PND,  orthopnea, palpitations, or presyncope/syncope.  Patient had some mild peripheral edema last week which has since resolved with the use of his diuretics and extremity elevation.  Patient also complained of intermittent vertiginous symptoms.  PMH significant for cardiovascular diagnoses.  Patient underwent diagnostic left heart catheterization back in 2010 revealing high-grade stenosis of the RCA.  Subsequent PCI was performed placing a 2.5 x 26 mm Cypher DES x1 to the RCA.  Patient suffered an NSTEMI and 2016.  PCI performed revealing patency of the previously placed RCA stent.  Additionally there was significant disease of the distal LM and stenosis of the ostial LCx.  FFR ratio was 0.76.  Patient was referred to CVTS for consideration of CABG procedure.  Patient underwent two-vessel CABG on 04/21/2015.  LIMA-LAD and SVG-OM 2 bypass grafts were placed.  Diagnostic right and left heart catheterization was performed on 05/02/2018 revealing a normal left ventricular systolic function with an EF of 55-65%.  LVEDP mildly elevated at 14 to 15 mmHg.  There was multivessel CAD noted; 10% proximal to mid RCA, 30% proximal RCA, 20% distal RCA, 95% ostial to proximal LCx, and 80% mid to distal LM.  LIMA-LAD bypass graft was widely patent.  SVG graft with mild diffuse disease.  No further interventions were pursued opting for medical management.  Long-term cardiac event monitor study performed on 12/15/2018 revealing a predominant underlying sinus rhythm with an average heart rate of 72 bpm; minimum heart rate 50 bpm.  There were 3 short runs of SVT noted.  There were no significant arrhythmias observed to explain patient's reports of vertiginous symptoms.  Myocardial perfusion imaging study performed on 07/21/2019 revealing normal left ventricular systolic function with EF of 52%.  There was a small  fixed apical defect of mild severity that was felt to likely be secondary to attenuation artifact.  There were no  regional wall motion abnormalities.  There were no EKG changes concerning for ischemia noted at peak stress during recovery.  Study determined to be low risk.  Blood pressure reasonably controlled at 130/78 on currently prescribed CCB, beta-blocker, nitrate, ARB, and diuretic therapies.  Patient is on a statin + ezetimibe for his HLD diagnosis and further ASCVD prevention.  T2DM well-controlled on currently prescribed regimen; last Hgb A1c was 6.4% when checked on 02/14/2021. Functional capacity, as defined by DASI, is documented as being >/= 4 METS.  No changes were made to his medication regimen.  Patient to follow-up with outpatient cardiology in 6 months or sooner if needed.  Demitrios Molyneux is scheduled for a THORACIC SPINAL CORD STIMULATOR AND PULSE GENERATOR PLACEMENT on 05/26/2021 with Dr. Lucy Chris, MD.  Given patient's past medical history significant for cardiovascular diagnoses, presurgical cardiac clearance was sought by the PAT team. Per cardiology, "based ACC/AHA guidelines, the patient's past medical history, and the amount of time since his last clinic visit, this patient would be at an overall ACCEPTABLE risk for the planned procedure without further cardiovascular testing or intervention at this time".  This patient is on daily antiplatelet therapy.  He was advised by his cardiologist that time off of this medication needed to be minimized to prevent any potential perioperative cardiovascular complications.  Surgery requesting that patient hold ASA for 7 days prior to procedure with plans to restart 7 days after.  Patient is aware that his last dose of ASA will be on 05/18/2021.  Patient denies previous perioperative complications with anesthesia in the past.  In review his EMR, there are no records available for review pertaining to past procedural/anesthetic courses within the [facility] system.  Vitals with BMI 05/12/2021 05/07/2021 04/23/2021  Height 5\' 7"  5\' 7"  5\' 7"   Weight 226  lbs 13 oz 224 lbs 220 lbs  BMI 35.51 35.08 34.45  Systolic - 130 110  Diastolic - 78 64  Pulse 77 71 71    Providers/Specialists:   NOTE: Primary physician provider listed below. Patient may have been seen by APP or partner within same practice.   PROVIDER ROLE / SPECIALTY LAST , MD Neurosurgery 04/29/2021  , FNP Primary Care Provider 03/31/2021  05/01/2021, MD Cardiology 05/07/2021  04/02/2021, MD Pain Management 04/23/2021   Allergies:  Colchicine and Lisinopril  Current Home Medications:   No current facility-administered medications for this encounter.    allopurinol (ZYLOPRIM) 300 MG tablet   aspirin 81 MG tablet   cyclobenzaprine (FLEXERIL) 10 MG tablet   metFORMIN (GLUCOPHAGE-XR) 500 MG 24 hr tablet   Multiple Vitamin (MULTIVITAMIN WITH MINERALS) TABS tablet   naproxen sodium (ALEVE) 220 MG tablet   nitroGLYCERIN (NITROSTAT) 0.4 MG SL tablet   pregabalin (LYRICA) 150 MG capsule   senna (SENOKOT) 8.6 MG TABS tablet   amLODipine (NORVASC) 5 MG tablet   atorvastatin (LIPITOR) 40 MG tablet   Blood Glucose Monitoring Suppl (FREESTYLE LITE) DEVI   carvedilol (COREG) 25 MG tablet   ezetimibe (ZETIA) 10 MG tablet   glucose blood (FREESTYLE LITE) test strip   isosorbide mononitrate (IMDUR) 60 MG 24 hr tablet   Lancets (FREESTYLE) lancets   losartan (COZAAR) 100 MG tablet   meloxicam (MOBIC) 15 MG tablet   pantoprazole (PROTONIX) 40 MG tablet   potassium chloride (KLOR-CON) 10 MEQ tablet  torsemide (DEMADEX) 20 MG tablet   TRULICITY 1.5 MG/0.5ML SOPN   History:   Past Medical History:  Diagnosis Date   Anginal pain (HCC)    Arthritis    BPH (benign prostatic hyperplasia)    CHF (congestive heart failure) (HCC)    Coronary artery disease 2010   a.) LHC 2010: high grade RCA stenosis -> 2.5 x 18mm Cypher DES to RCA. b.) NSTEMI 2016 -> PCI revealed pat RCA stent; sig dLM Dz with FFR ratio 0.76 and oLCx stenosis; ref to CVTS. c.)  2v CABG 04/21/2015; LIMA-LAD, SVG-OM2. d.) [facility] 05/02/2018: EF 55-65%; LVEDP 14-15 mmHg; 10% p-m RCA, 30% pRCA, 20% dRCA, 95% o-pLCx, 80% m-dLM; LIMA graft pat. SVG graft with mild diffuse Dz.   DDD (degenerative disc disease), lumbar    Dyspnea    GERD (gastroesophageal reflux disease)    Hypercholesteremia    Hypertension    Left foot drop    NSTEMI (non-ST elevated myocardial infarction) (HCC) 2016   a.) PCI revealed patent RCA stent; significant dLM disease with FFR ratio 0.76 and oLCx stenosis; refer to CVTS for CABG.   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   a.) 2v CABG; LIMA-LAD, SVG-OM2   T2DM (type 2 diabetes mellitus) (HCC)    Past Surgical History:  Procedure Laterality Date   APPENDECTOMY     BACK SURGERY  11/01/2017   SL 5 and S1   CARDIAC CATHETERIZATION     CARPAL TUNNEL RELEASE     left hand   CHOLECYSTECTOMY     CHOLECYSTECTOMY     CORONARY ARTERY BYPASS GRAFT  04/21/2015   CABG x 2 [place] [facility] LIMA to LAD amd SVG to OM2   CORONARY STENT PLACEMENT  2010   Cordis Cypher Sirolimus-eluting stent 2.50 mm x 26 mm placed to the RCA at [facility]    ESOPHAGEAL MANOMETRY N/A 08/04/2017   Procedure: ESOPHAGEAL MANOMETRY (EM);  Surgeon: Toney Reil, MD;  Location: [facility] ENDOSCOPY;  Service: Endoscopy;  Laterality: N/A;   FRACTURE SURGERY     KNEE SURGERY     KNEE SURGERY     right knee    LAMINECTOMY     "plate in neck D3-T7"   LEFT HEART CATH AND CORS/GRAFTS ANGIOGRAPHY N/A 05/02/2018   Procedure: CORS/GRAFTS ANGIOGRAPHY;  Surgeon: Iran Ouch, MD;  Location: [facility] INVASIVE CV LAB;  Service: Cardiovascular;  Laterality: N/A;   RIGHT/LEFT HEART CATH AND CORONARY ANGIOGRAPHY Bilateral 05/02/2018   Procedure: LEFT HEART CATH;  Surgeon: Iran Ouch, MD;  Location: [facility] INVASIVE CV LAB;  Service: Cardiovascular;  Laterality: Bilateral;   VASECTOMY     Family History  Problem Relation Age of Onset    Arthritis Mother    Hyperlipidemia Father    Hypertension Father    Heart attack Father 63   Heart murmur Brother    Valvular heart disease Brother    Cataracts Maternal Grandmother    Glaucoma Maternal Grandmother    Cancer Maternal Grandfather    Heart attack Paternal Grandfather    Hypertension Paternal Grandfather    Prostate cancer Neg Hx    Bladder Cancer Neg Hx    Kidney cancer Neg Hx    Social History   Tobacco Use   Smoking status: Former    Packs/day: 0.25    Years: 2.00    Pack years: 0.50    Types: Cigarettes    Quit date: 05/26/2005  Years since quitting: 15.9   Smokeless tobacco: Former    Types: Associate Professor Use: Never used  Substance Use Topics   Alcohol use: No   Drug use: No    Pertinent Clinical Results:  LABS: Labs reviewed: Acceptable for surgery.  No visits with results within 3 Day(s) from this visit.  Latest known visit with results is:  Hospital Outpatient Visit on 05/12/2021  Component Date Value Ref Range Status   aPTT 05/12/2021 32  24 - 36 seconds Final   Performed at [facility], [address] [place], [place] [address]   Sodium 05/12/2021 138  135 - 145 mmol/L Final   Potassium 05/12/2021 3.9  3.5 - 5.1 mmol/L Final   Chloride 05/12/2021 103  98 - 111 mmol/L Final   CO2 05/12/2021 25  22 - 32 mmol/L Final   Glucose, Bld 05/12/2021 150 (H)  70 - 99 mg/dL Final   Glucose reference range applies only to samples taken after fasting for at least 8 hours.   BUN 05/12/2021 27 (H)  8 - 23 mg/dL Final   Creatinine, Ser 05/12/2021 1.28 (H)  0.61 - 1.24 mg/dL Final   Calcium 60/45/4098 9.3  8.9 - 10.3 mg/dL Final   GFR, Estimated 05/12/2021 >60  >60 mL/min Final   Comment: (NOTE) Calculated using the CKD-EPI Creatinine Equation (2021)    Anion gap 05/12/2021 10  5 - 15 Final   Performed at [facility], [address] [place], [place] [address]   WBC 05/12/2021 5.8  4.0 - 10.5 K/uL Final   RBC  05/12/2021 4.10 (L)  4.22 - 5.81 MIL/uL Final   Hemoglobin 05/12/2021 12.7 (L)  13.0 - 17.0 g/dL Final   HCT 78/29/5621 36.9 (L)  39.0 - 52.0 % Final   MCV 05/12/2021 90.0  80.0 - 100.0 fL Final   MCH 05/12/2021 31.0  26.0 - 34.0 pg Final   MCHC 05/12/2021 34.4  30.0 - 36.0 g/dL Final   RDW 30/86/5784 13.9  11.5 - 15.5 % Final   Platelets 05/12/2021 176  150 - 400 K/uL Final   nRBC 05/12/2021 0.0  0.0 - 0.2 % Final   Performed at [facility], [address] [place], [place] [address]   Prothrombin Time 05/12/2021 13.3  11.4 - 15.2 seconds Final   INR 05/12/2021 1.0  0.8 - 1.2 Final   Comment: (NOTE) INR goal varies based on device and disease states. Performed at [facility], [address] [place], [place] [address]    MRSA, PCR 05/12/2021 NEGATIVE  NEGATIVE Final   Staphylococcus aureus 05/12/2021 NEGATIVE  NEGATIVE Final   Comment: (NOTE) The Xpert SA Assay (FDA approved for NASAL specimens in patients 100 years of age and older), is one component of a comprehensive surveillance program. It is not intended to diagnose infection nor to guide or monitor treatment. Performed at [facility], [address] [place], [place] [address]   ABO/RH(D) 05/12/2021 O POS   Final   Antibody Screen 05/12/2021 NEG   Final   Sample Expiration 05/12/2021 05/26/2021,2359   Final   Extend sample reason 05/12/2021    Final                   Value:NO TRANSFUSIONS OR PREGNANCY IN THE PAST 3 MONTHS Performed at [facility], [address] [place], [place] [address]    Color, Urine 05/12/2021 YELLOW  YELLOW Final   APPearance  05/12/2021 CLEAR  CLEAR Final   Specific Gravity, Urine 05/12/2021 1.015  1.005 - 1.030 Final   pH 05/12/2021 6.0  5.0 - 8.0 Final   Glucose, UA 05/12/2021 NEGATIVE  NEGATIVE mg/dL Final   Hgb urine dipstick 05/12/2021 NEGATIVE  NEGATIVE Final   Bilirubin Urine 05/12/2021 NEGATIVE  NEGATIVE Final   Ketones, ur 05/12/2021 NEGATIVE   NEGATIVE mg/dL Final   Protein, ur 16/03/9603 NEGATIVE  NEGATIVE mg/dL Final   Nitrite 54/02/8118 NEGATIVE  NEGATIVE Final   Leukocytes,Ua 05/12/2021 NEGATIVE  NEGATIVE Final   RBC / HPF 05/12/2021 0-5  0 - 5 RBC/hpf Final   WBC, UA 05/12/2021 0-5  0 - 5 WBC/hpf Final   Bacteria, UA 05/12/2021 NONE SEEN  NONE SEEN Final   Squamous Epithelial / LPF 05/12/2021 NONE SEEN  0 - 5 Final   Mucus 05/12/2021 PRESENT   Final   Performed at [facility], [address] [place], [place] [address]    ECG: Date: 05/12/2021 Time ECG obtained: 1241 PM Rate: 73 bpm Rhythm: normal sinus Axis (leads I and aVF): Normal Intervals: PR 186 ms. QRS 88 ms. QTc 425 ms. ST segment and T wave changes: No evidence of acute ST segment elevation or depression Comparison: Similar to previous tracing obtained on 04/09/2019   IMAGING / PROCEDURES: MYOCARDIAL PERFUSION IMAGING STUDY (LEXISCAN) performed on 07/21/2019 LVEF 52% Normal wall motion Small fixed apical defect of mild severity likely secondary to attenuation artifact No EKG changes concerning for ischemia at peak rest during recovery Pharmacological myocardial perfusion imaging study with no significant ischemia Low risk scan  LONG TERM CARDIAC EVENT MONITOR STUDY performed on 12/15/2018 Predominant underlying normal sinus rhythm with an average heart rate of 72 bpm Minimum heart rate 50 bpm 3 short runs of SVT No significant arrhythmia to explain dizziness  RIGHT/LEFT HEART CATHETERIZATION AND CORONARY ANGIOGRAPHY performed on 05/02/2018 LVEF 55-65% LVEDP mildly elevated at 14-15 mmHg Normal left ventricular systolic function Multivessel CAD 10% proximal to mid RCA 30% proximal RCA 20% distal RCA 95% ostial to proximal LCx 80% mid to distal LM Grafts LIMA-LAD bypass graft patent SVG-OM 2 bypass graft exhibited mild diffuse disease Recommendations: Continue medical therapy for coronary artery disease and chronic diastolic heart  failure   Impression and Plan:  Josimar Corning has been referred for pre-anesthesia review and clearance prior to him undergoing the planned anesthetic and procedural courses. Available labs, pertinent testing, and imaging results were personally reviewed by me. This patient has been appropriately cleared by cardiology with an overall ACCEPTABLE risk of significant perioperative cardiovascular complications.  Based on clinical review performed today (05/19/21), barring any significant acute changes in the patient's overall condition, it is anticipated that he will be able to proceed with the planned surgical intervention. Any acute changes in clinical condition may necessitate his procedure being postponed and/or cancelled. Patient will meet with anesthesia team (MD and/or CRNA) on the day of his procedure for preoperative evaluation/assessment. Questions regarding anesthetic course will be fielded at that time.   Pre-surgical instructions were reviewed with the patient during his PAT appointment and questions were fielded by PAT clinical staff. Patient was advised that if any questions or concerns arise prior to his procedure then he should return a call to PAT and/or his surgeon's office to discuss.  Quentin Mulling, MSN, APRN, FNP-C, CEN [facility]  Peri-operative Services Nurse Practitioner Phone: [phone] Fax: [phone] 05/19/21 3:28 PM  NOTE: This note has been prepared using Dragon  dictation software. Despite my best ability to proofread, there is always the potential that unintentional transcriptional errors may still occur from this process.

## 2021-05-25 MED ORDER — ORAL CARE MOUTH RINSE: 15.0000 mL | Freq: Once | OROMUCOSAL | Status: AC

## 2021-05-25 MED ORDER — CHLORHEXIDINE GLUCONATE 0.12 % MT SOLN: 15.0000 mL | Freq: Once | OROMUCOSAL | Status: AC

## 2021-05-25 MED ORDER — LACTATED RINGERS IV SOLN: INTRAVENOUS | Status: DC

## 2021-05-25 MED ORDER — LACTATED RINGERS IV SOLN: 1000.0000 mL | Freq: Once | INTRAVENOUS | Status: DC

## 2021-05-26 ENCOUNTER — Encounter: Payer: Self-pay | Admitting: Neurosurgery

## 2021-05-26 ENCOUNTER — Ambulatory Visit
Admission: RE | Admit: 2021-05-26 | Discharge: 2021-05-26 | Disposition: A | Discharge status: Home / Self Care | Attending: Neurosurgery | Admitting: Neurosurgery

## 2021-05-26 ENCOUNTER — Ambulatory Visit

## 2021-05-26 ENCOUNTER — Ambulatory Visit: Admitting: Urgent Care

## 2021-05-26 ENCOUNTER — Encounter
Admission: RE | Disposition: A | Discharge status: Home / Self Care | Payer: Self-pay | Source: Home / Self Care | Attending: Neurosurgery

## 2021-05-26 ENCOUNTER — Other Ambulatory Visit: Payer: Self-pay

## 2021-05-26 DIAGNOSIS — K219 Gastro-esophageal reflux disease without esophagitis: Secondary | ICD-10-CM | POA: Diagnosis not present

## 2021-05-26 DIAGNOSIS — E119 Type 2 diabetes mellitus without complications: Secondary | ICD-10-CM | POA: Diagnosis not present

## 2021-05-26 DIAGNOSIS — I110 Hypertensive heart disease with heart failure: Secondary | ICD-10-CM | POA: Diagnosis not present

## 2021-05-26 DIAGNOSIS — Z79899 Other long term (current) drug therapy: Secondary | ICD-10-CM | POA: Insufficient documentation

## 2021-05-26 DIAGNOSIS — I2510 Atherosclerotic heart disease of native coronary artery without angina pectoris: Secondary | ICD-10-CM | POA: Diagnosis not present

## 2021-05-26 DIAGNOSIS — I509 Heart failure, unspecified: Secondary | ICD-10-CM | POA: Diagnosis not present

## 2021-05-26 DIAGNOSIS — M1990 Unspecified osteoarthritis, unspecified site: Secondary | ICD-10-CM | POA: Insufficient documentation

## 2021-05-26 DIAGNOSIS — M961 Postlaminectomy syndrome, not elsewhere classified: Secondary | ICD-10-CM | POA: Diagnosis not present

## 2021-05-26 DIAGNOSIS — M5410 Radiculopathy, site unspecified: Secondary | ICD-10-CM | POA: Diagnosis not present

## 2021-05-26 DIAGNOSIS — Z7984 Long term (current) use of oral hypoglycemic drugs: Secondary | ICD-10-CM | POA: Diagnosis not present

## 2021-05-26 DIAGNOSIS — Z951 Presence of aortocoronary bypass graft: Secondary | ICD-10-CM | POA: Diagnosis not present

## 2021-05-26 DIAGNOSIS — Z7985 Long-term (current) use of injectable non-insulin antidiabetic drugs: Secondary | ICD-10-CM | POA: Diagnosis not present

## 2021-05-26 DIAGNOSIS — G894 Chronic pain syndrome: Secondary | ICD-10-CM | POA: Diagnosis not present

## 2021-05-26 DIAGNOSIS — Z87891 Personal history of nicotine dependence: Secondary | ICD-10-CM | POA: Insufficient documentation

## 2021-05-26 DIAGNOSIS — I252 Old myocardial infarction: Secondary | ICD-10-CM | POA: Insufficient documentation

## 2021-05-26 DIAGNOSIS — Z419 Encounter for procedure for purposes other than remedying health state, unspecified: Secondary | ICD-10-CM

## 2021-05-26 HISTORY — PX: THORACIC LAMINECTOMY FOR SPINAL CORD STIMULATOR: SHX6887

## 2021-05-26 HISTORY — DX: Type 2 diabetes mellitus without complications: E11.9

## 2021-05-26 HISTORY — DX: Foot drop, left foot: M21.372

## 2021-05-26 HISTORY — DX: Postlaminectomy syndrome, not elsewhere classified: M96.1

## 2021-05-26 HISTORY — DX: Benign prostatic hyperplasia without lower urinary tract symptoms: N40.0

## 2021-05-26 HISTORY — DX: Angina pectoris, unspecified: I20.9

## 2021-05-26 HISTORY — DX: Other intervertebral disc degeneration, lumbar region: M51.36

## 2021-05-26 HISTORY — DX: Obstructive sleep apnea (adult) (pediatric): G47.33

## 2021-05-26 HISTORY — DX: Other intervertebral disc degeneration, lumbar region without mention of lumbar back pain or lower extremity pain: M51.369

## 2021-05-26 LAB — GLUCOSE, CAPILLARY
Glucose-Capillary: 165 mg/dL — ABNORMAL HIGH (ref 70–99)
Glucose-Capillary: 146 mg/dL — ABNORMAL HIGH (ref 70–99)

## 2021-05-26 LAB — ABO/RH: ABO/RH(D): O POS

## 2021-05-26 SURGERY — THORACIC LAMINECTOMY FOR SPINAL CORD STIMULATOR
Anesthesia: General | Site: Back

## 2021-05-26 MED ORDER — PROMETHAZINE HCL 25 MG/ML IJ SOLN: 6.2500 mg | INTRAMUSCULAR | Status: DC | PRN

## 2021-05-26 MED ORDER — PROPOFOL 10 MG/ML IV BOLUS
INTRAVENOUS | Status: DC | PRN
  Administered 2021-05-26: 160 mg via INTRAVENOUS
  Administered 2021-05-26: 40 mg via INTRAVENOUS

## 2021-05-26 MED ORDER — BUPIVACAINE-EPINEPHRINE (PF) 0.5% -1:200000 IJ SOLN
INTRAMUSCULAR | Status: DC | PRN
  Administered 2021-05-26: 10 mL

## 2021-05-26 MED ORDER — PHENYLEPHRINE 40 MCG/ML (10ML) SYRINGE FOR IV PUSH (FOR BLOOD PRESSURE SUPPORT)
PREFILLED_SYRINGE | INTRAVENOUS | Status: DC | PRN
  Administered 2021-05-26: 100 ug via INTRAVENOUS

## 2021-05-26 MED ORDER — LIDOCAINE HCL (CARDIAC) PF 100 MG/5ML IV SOSY
PREFILLED_SYRINGE | INTRAVENOUS | Status: DC | PRN
  Administered 2021-05-26: 100 mg via INTRAVENOUS

## 2021-05-26 MED ORDER — DROPERIDOL 2.5 MG/ML IJ SOLN
0.6250 mg | Freq: Once | INTRAMUSCULAR | Status: DC | PRN
  Filled 2021-05-26: qty 2

## 2021-05-26 MED ORDER — MIDAZOLAM HCL 2 MG/2ML IJ SOLN
INTRAMUSCULAR | Status: AC
  Filled 2021-05-26: qty 2

## 2021-05-26 MED ORDER — SODIUM CHLORIDE 0.9 % IV SOLN: INTRAVENOUS | Status: DC

## 2021-05-26 MED ORDER — REMIFENTANIL HCL 1 MG IV SOLR
INTRAVENOUS | Status: AC
  Filled 2021-05-26: qty 1000

## 2021-05-26 MED ORDER — DEXAMETHASONE SODIUM PHOSPHATE 10 MG/ML IJ SOLN
INTRAMUSCULAR | Status: DC | PRN
  Administered 2021-05-26: 5 mg via INTRAVENOUS

## 2021-05-26 MED ORDER — FENTANYL CITRATE (PF) 100 MCG/2ML IJ SOLN
INTRAMUSCULAR | Status: DC | PRN
  Administered 2021-05-26: 100 ug via INTRAVENOUS

## 2021-05-26 MED ORDER — FENTANYL CITRATE (PF) 100 MCG/2ML IJ SOLN
INTRAMUSCULAR | Status: AC
  Filled 2021-05-26: qty 2

## 2021-05-26 MED ORDER — VANCOMYCIN HCL 1000 MG IV SOLR
INTRAVENOUS | Status: DC | PRN
  Administered 2021-05-26: 1000 mg

## 2021-05-26 MED ORDER — OXYCODONE HCL 5 MG/5ML PO SOLN: 5.0000 mg | Freq: Once | ORAL | Status: AC | PRN

## 2021-05-26 MED ORDER — REMIFENTANIL HCL 1 MG IV SOLR
INTRAVENOUS | Status: DC | PRN
  Administered 2021-05-26: .05 ug/kg/min via INTRAVENOUS

## 2021-05-26 MED ORDER — FENTANYL CITRATE (PF) 100 MCG/2ML IJ SOLN
25.0000 ug | INTRAMUSCULAR | Status: DC | PRN
  Administered 2021-05-26: 50 ug via INTRAVENOUS
  Administered 2021-05-26 (×2): 25 ug via INTRAVENOUS

## 2021-05-26 MED ORDER — OXYCODONE HCL 5 MG PO TABS: 5.0000 mg | ORAL_TABLET | Freq: Four times a day (QID) | ORAL | 0 refills | Status: AC | PRN

## 2021-05-26 MED ORDER — CEFAZOLIN SODIUM-DEXTROSE 2-4 GM/100ML-% IV SOLN
INTRAVENOUS | Status: AC
  Filled 2021-05-26: qty 100

## 2021-05-26 MED ORDER — SUCCINYLCHOLINE CHLORIDE 200 MG/10ML IV SOSY
PREFILLED_SYRINGE | INTRAVENOUS | Status: DC | PRN
  Administered 2021-05-26: 120 mg via INTRAVENOUS

## 2021-05-26 MED ORDER — ACETAMINOPHEN 10 MG/ML IV SOLN: 1000.0000 mg | Freq: Once | INTRAVENOUS | Status: DC | PRN

## 2021-05-26 MED ORDER — CEFAZOLIN SODIUM-DEXTROSE 2-4 GM/100ML-% IV SOLN
2.0000 g | INTRAVENOUS | Status: AC
  Administered 2021-05-26: 2 g via INTRAVENOUS

## 2021-05-26 MED ORDER — CHLORHEXIDINE GLUCONATE 0.12 % MT SOLN
OROMUCOSAL | Status: AC
  Administered 2021-05-26: 15 mL via OROMUCOSAL
  Filled 2021-05-26: qty 15

## 2021-05-26 MED ORDER — OXYCODONE HCL 5 MG PO TABS
ORAL_TABLET | ORAL | Status: AC
  Filled 2021-05-26: qty 1

## 2021-05-26 MED ORDER — BUPIVACAINE-EPINEPHRINE (PF) 0.5% -1:200000 IJ SOLN
INTRAMUSCULAR | Status: AC
  Filled 2021-05-26: qty 30

## 2021-05-26 MED ORDER — VANCOMYCIN HCL 1000 MG IV SOLR
INTRAVENOUS | Status: AC
  Filled 2021-05-26: qty 20

## 2021-05-26 MED ORDER — 0.9 % SODIUM CHLORIDE (POUR BTL) OPTIME
TOPICAL | Status: DC | PRN
  Administered 2021-05-26: 1000 mL

## 2021-05-26 MED ORDER — OXYCODONE HCL 5 MG PO TABS
5.0000 mg | ORAL_TABLET | Freq: Once | ORAL | Status: AC | PRN
  Administered 2021-05-26: 5 mg via ORAL

## 2021-05-26 MED ORDER — SUCCINYLCHOLINE CHLORIDE 200 MG/10ML IV SOSY
PREFILLED_SYRINGE | INTRAVENOUS | Status: AC
  Filled 2021-05-26: qty 10

## 2021-05-26 MED ORDER — ACETAMINOPHEN 10 MG/ML IV SOLN
INTRAVENOUS | Status: DC | PRN
  Administered 2021-05-26: 1000 mg via INTRAVENOUS

## 2021-05-26 MED ORDER — ONDANSETRON HCL 4 MG/2ML IJ SOLN
INTRAMUSCULAR | Status: DC | PRN
  Administered 2021-05-26: 4 mg via INTRAVENOUS

## 2021-05-26 MED ORDER — KETOROLAC TROMETHAMINE 30 MG/ML IJ SOLN
INTRAMUSCULAR | Status: DC | PRN
  Administered 2021-05-26: 15 mg via INTRAVENOUS

## 2021-05-26 MED ORDER — ACETAMINOPHEN 10 MG/ML IV SOLN
INTRAVENOUS | Status: AC
  Filled 2021-05-26: qty 100

## 2021-05-26 SURGICAL SUPPLY — 86 items
ADH SKN CLS APL DERMABOND .7 (GAUZE/BANDAGES/DRESSINGS) ×2
AGENT HMST KT MTR STRL THRMB (HEMOSTASIS)
APL PRP STRL LF DISP 70% ISPRP (MISCELLANEOUS) ×1
APL SRG 60D 8 XTD TIP BNDBL (TIP)
BLADE BOVIE TIP EXT 4 (BLADE) ×1 IMPLANT
BLADE CLIPPER SPEC (BLADE) ×1 IMPLANT
BUR NEURO DRILL SOFT 3.0X3.8M (BURR) IMPLANT
CHLORAPREP W/TINT 26 (MISCELLANEOUS) ×2 IMPLANT
CNTNR SPEC 2.5X3XGRAD LEK (MISCELLANEOUS) ×1
CONT SPEC 4OZ STER OR WHT (MISCELLANEOUS) ×1
CONT SPEC 4OZ STRL OR WHT (MISCELLANEOUS) ×1
CONTAINER SPEC 2.5X3XGRAD LEK (MISCELLANEOUS) ×1 IMPLANT
COUNTER NEEDLE 20/40 LG (NEEDLE) ×2 IMPLANT
COVER LIGHT HANDLE STERIS (MISCELLANEOUS) ×4 IMPLANT
CUP MEDICINE 2OZ PLAST GRAD ST (MISCELLANEOUS) ×2 IMPLANT
DERMABOND ADVANCED (GAUZE/BANDAGES/DRESSINGS) ×2
DERMABOND ADVANCED .7 DNX12 (GAUZE/BANDAGES/DRESSINGS) ×1 IMPLANT
DEVICE IMPLANT NEUROSTIMULATOR (Neuro Prosthesis/Implant) ×1 IMPLANT
DRAPE C-ARM 42X72 X-RAY (DRAPES) ×3 IMPLANT
DRAPE C-ARMOR (DRAPES) ×1 IMPLANT
DRAPE LAPAROTOMY 100X77 ABD (DRAPES) ×2 IMPLANT
DRAPE SURG 17X11 SM STRL (DRAPES) ×2 IMPLANT
DRSG OPSITE POSTOP 3X4 (GAUZE/BANDAGES/DRESSINGS) ×1 IMPLANT
DRSG OPSITE POSTOP 4X6 (GAUZE/BANDAGES/DRESSINGS) ×1 IMPLANT
DURASEAL APPLICATOR TIP (TIP) IMPLANT
DURASEAL SPINE SEALANT 3ML (MISCELLANEOUS) IMPLANT
ELECT CAUTERY BLADE TIP 2.5 (TIP) ×2
ELECT EZSTD 165MM 6.5IN (MISCELLANEOUS)
ELECT REM PT RETURN 9FT ADLT (ELECTROSURGICAL) ×2
ELECTRODE CAUTERY BLDE TIP 2.5 (TIP) ×1 IMPLANT
ELECTRODE EZSTD 165MM 6.5IN (MISCELLANEOUS) ×1 IMPLANT
ELECTRODE REM PT RTRN 9FT ADLT (ELECTROSURGICAL) ×1 IMPLANT
ENVELOPE ABSORB ANTIBACTERIAL (Mesh General) ×2 IMPLANT
FEE INTRAOP CADWELL SUPPLY NCS (MISCELLANEOUS) ×1 IMPLANT
FEE INTRAOP MONITOR IMPULS NCS (MISCELLANEOUS) IMPLANT
GAUZE 4X4 16PLY ~~LOC~~+RFID DBL (SPONGE) ×3 IMPLANT
GAUZE SPONGE 4X4 12PLY STRL (GAUZE/BANDAGES/DRESSINGS) IMPLANT
GLOVE SRG 8 PF TXTR STRL LF DI (GLOVE) ×1 IMPLANT
GLOVE SURG SYN 6.5 ES PF (GLOVE) ×4 IMPLANT
GLOVE SURG SYN 6.5 PF PI (GLOVE) ×2 IMPLANT
GLOVE SURG SYN 7.0 (GLOVE) ×4 IMPLANT
GLOVE SURG SYN 7.0 PF PI (GLOVE) ×2 IMPLANT
GLOVE SURG SYN 8.0 (GLOVE) ×2 IMPLANT
GLOVE SURG SYN 8.0 PF PI (GLOVE) ×1 IMPLANT
GLOVE SURG UNDER POLY LF SZ6.5 (GLOVE) ×4 IMPLANT
GLOVE SURG UNDER POLY LF SZ7 (GLOVE) ×2 IMPLANT
GLOVE SURG UNDER POLY LF SZ8 (GLOVE) ×2
GOWN SRG LRG LVL 4 IMPRV REINF (GOWNS) ×2 IMPLANT
GOWN STRL REIN LRG LVL4 (GOWNS) ×4
GOWN STRL REUS W/ TWL LRG LVL3 (GOWN DISPOSABLE) ×1 IMPLANT
GOWN STRL REUS W/ TWL XL LVL3 (GOWN DISPOSABLE) ×2 IMPLANT
GOWN STRL REUS W/TWL LRG LVL3 (GOWN DISPOSABLE) ×2
GOWN STRL REUS W/TWL XL LVL3 (GOWN DISPOSABLE) ×4
GRADUATE 1200CC STRL 31836 (MISCELLANEOUS) ×2 IMPLANT
INTRAOP CADWELL SUPPLY FEE NCS (MISCELLANEOUS) ×1
INTRAOP DISP SUPPLY FEE NCS (MISCELLANEOUS) ×2
INTRAOP MONITOR FEE IMPULS NCS (MISCELLANEOUS)
INTRAOP MONITOR FEE IMPULSE (MISCELLANEOUS)
KIT LEAD (Lead) ×2 IMPLANT
KIT TURNOVER KIT A (KITS) ×2 IMPLANT
MANIFOLD NEPTUNE II (INSTRUMENTS) ×2 IMPLANT
MARKER SKIN DUAL TIP RULER LAB (MISCELLANEOUS) ×4 IMPLANT
NDL SAFETY ECLIPSE 18X1.5 (NEEDLE) ×1 IMPLANT
NEEDLE HYPO 18GX1.5 SHARP (NEEDLE) ×2
NEEDLE HYPO 22GX1.5 SAFETY (NEEDLE) ×2 IMPLANT
NS IRRIG 1000ML POUR BTL (IV SOLUTION) ×2 IMPLANT
PACK LAMINECTOMY NEURO (CUSTOM PROCEDURE TRAY) ×2 IMPLANT
PAD ARMBOARD 7.5X6 YLW CONV (MISCELLANEOUS) ×4 IMPLANT
POUCH TYRX ANTIBAC NEURO MED (Mesh General) IMPLANT
RECHARGER INTELLIS (NEUROSURGERY SUPPLIES) ×1 IMPLANT
REPROGRAMMER INTELLIS (NEUROSURGERY SUPPLIES) ×1 IMPLANT
STAPLER SKIN PROX 35W (STAPLE) IMPLANT
SURGIFLO W/THROMBIN 8M KIT (HEMOSTASIS) IMPLANT
SUT ETHILON 3-0 FS-10 30 BLK (SUTURE)
SUT POLYSORB 2-0 5X18 GS-10 (SUTURE) ×7 IMPLANT
SUT SILK 2 0 SH (SUTURE) ×6 IMPLANT
SUT VIC AB 0 CT1 18XCR BRD 8 (SUTURE) ×2 IMPLANT
SUT VIC AB 0 CT1 8-18 (SUTURE) ×2
SUTURE EHLN 3-0 FS-10 30 BLK (SUTURE) ×2 IMPLANT
SYR 10ML LL (SYRINGE) ×4 IMPLANT
SYR 20ML LL LF (SYRINGE) ×2 IMPLANT
SYR 30ML LL (SYRINGE) ×4 IMPLANT
SYR 3ML LL SCALE MARK (SYRINGE) ×2 IMPLANT
TOWEL OR 17X26 4PK STRL BLUE (TOWEL DISPOSABLE) ×4 IMPLANT
TUBING CONNECTING 10 (TUBING) ×2 IMPLANT
WATER STERILE IRR 500ML POUR (IV SOLUTION) ×1 IMPLANT

## 2021-05-26 NOTE — Progress Notes (Signed)
Pharmacy Antibiotic Note  Jaymes Hang is a 65 y.o. male admitted on (Not on file) with surgical prophylaxis.  Pharmacy has been consulted for Cefazolin dosing.  Plan: TBW = 102.9 kg   Cefazolin 2 gm IV X 1 60 min pre-op ordered for 12/12 @ 0500.      No data recorded.  No results for input(s): WBC, CREATININE, LATICACIDVEN, VANCOTROUGH, VANCOPEAK, VANCORANDOM, GENTTROUGH, GENTPEAK, GENTRANDOM, TOBRATROUGH, TOBRAPEAK, TOBRARND, AMIKACINPEAK, AMIKACINTROU, AMIKACIN in the last 168 hours.  Estimated Creatinine Clearance: 66.6 mL/min (A) (by C-G formula based on SCr of 1.28 mg/dL (H)).    Allergies  Allergen Reactions   Colchicine Other (See Comments)    Palpitations and headache   Lisinopril Cough    Antimicrobials this admission:   >>    >>   Dose adjustments this admission:   Microbiology results:  BCx:   UCx:    Sputum:    MRSA PCR:   Thank you for allowing pharmacy to be a part of this patient's care.  Anabia Weatherwax D 05/26/2021 1:22 AM

## 2021-05-26 NOTE — Op Note (Signed)
Operative Note   SURGERY DATE: 05/26/2021   PRE-OP DIAGNOSIS:  Chronic pain syndrome   POST-OP DIAGNOSIS: Post-Op Diagnosis Codes: Chronic pain syndrome   Procedure(s) with comments: Percutaneous thoracic spinal cord stimulator lead placement Right flank pulse generator placement   SURGEON:     Malen Gauze, MD           ANESTHESIA: General    OPERATIVE FINDINGS: Successful placement of thoracic spinal cord stimulator and pulse generator   Indication Mr. Guitron was seen in clinic on 11/15 after having undergone a previous spinal cord stimulator trial and did well with greater than 90% relief of pain in the legs and back. The patient wished to proceed to permanent implant to decrease medication use and achieve better pain control. Risks including damage to spinal cord, weakness, hematoma, infection, failure of pain relief, post-operative pain, need for revision, stroke, heart attack, pneumonia, and spinal cord injury were discussed.      Procedure The patient was brought to the operating room where vascular access was obtained and he was intubated by the anesthesia service.   He was placed prone on gel rolls. Antibiotics were given. Fluoroscopy was used to confirm planned incision in lumbar area at the level of L1-2 in the midline.  An additional incision was planned in the right flank for the pulse generator placement.  The patient was prepped and draped in a sterile fashion. A hard time out was performed. Local anesthetic was instilled into planned incision sites.    The midline lumbar incision was opened and taken to the fascia using cautery.Next, a Touhy needle was used to insert in a paramedian approach to enter the interlaminar space between L2 ad L1.  Once loss of resistance was identified, this was confirmed with a metal stylette.  Next, the percutaneous lead was passed in the rostral direction using fluoroscopy as guidance to keep in the midline.  This was passed  without resistance to the level of the T8 vertebral body on the left.  Lateral views were obtained to confirm we were in the dorsal epidural space.  Next, the same procedure was performed contralaterally. We placed an additional lead on the right at T8, slightly offset to the previous lead to allow for better coverage. The leads were secured with anchors in the fascia.  The incision was irrigated and hemostasis obtained.    Next, the right flank incision was opened and taken to the fascia and inferior dissection to allow for a large enough pocket for the placement of the battery.  The incision was irrigated and hemostasis obtained.  Next, a tunneler was used to pass the electrode leads from the lumbar incision to the right flank.  There, the electrodes were attached to the pulse generator and impedances were found to be normal.  The pulse generator was placed into the incision taking care to place the wires beneath the pulse generator.    A final fluoroscopic image was taken show good placement of percutaneous leads. The battery and wires were secured to fascia with suture with the battery placed in an antibiotic pouch. Vancomycin powder was placed into the incisions.  Next, a combination of 0 and 2-0  Vicryls were used to close the incisions with Dermabond on the skin in flank and lumbar midline.   The patient was returned to supine position and the patient was seen to be moving all extremities symmetrically and was taken to PACU for recovery. The family was updated and all questions  answered.      ESTIMATED BLOOD LOSS:   20 cc   SPECIMENS None   IMPLANT  KIT LEAD - [id number]  Inventory Item: KIT LEAD Serial no.:  Model/Cat no.: [id number]  Implant name: KIT LEAD - [id number] Laterality: N/A Area: Spine Thoracic  Manufacturer: [employer] Date of Manufacture:    Action: Implanted Number Used: 1   Device Identifier:  Device Identifier Type:     KIT LEAD - [id number]  Inventory Item: KIT  LEAD Serial no.:  Model/Cat no.: [id number]  Implant name: KIT LEAD - [id number] Laterality: N/A Area: Spine Thoracic  Manufacturer: [employer] Date of Manufacture:    Action: Implanted Number Used: 1   Device Identifier:  Device Identifier Type:     DEVICE Gerilyn Pilgrim H  Inventory Item: DEVICE IMPLANT NEUROSTIMINATOR Serial no.: [id number] Model/Cat no.: [id number]  Implant name: DEVICE Lucillie Garfinkel [id number] H Laterality: N/A Area: Spine Thoracic  Manufacturer: [employer] NEUROMOD PAIN MGMT Date of Manufacture:    Action: Implanted Number Used: 1   Device Identifier:  Device Identifier Type:     ENVELOPE ABSORB ANTIBACTERIAL - [id number]  Inventory Item: ENVELOPE ABSORB ANTIBACTERIAL Serial no.:  Model/Cat no.: [id number]  Implant name: ENVELOPE ABSORB ANTIBACTERIAL - [id number] Laterality: N/A Area: Spine Thoracic  Manufacturer: [employer] Date of Manufacture:    Action: Implanted Number Used: 1   Device Identifier:  Device Identifier Type:     I performed the case in its entirety with the assistance of Cooper Render, [place]   Deetta Perla, [place]

## 2021-05-26 NOTE — Discharge Summary (Signed)
Discharge Summary  Patient ID: Jonathon Newman MRN: [id number] DOB/AGE: 1955-07-27 65 y.o.  Admit date: 05/26/2021 Discharge date: 05/26/2021  Admission Diagnoses: Chronic pain syndrome  Discharge Diagnoses:  Active Problems:   * No active hospital problems. *   Discharged Condition: good  Hospital Course:  Cristal GUERRETTE is a 68 presenting with chronic pain s/p placement of thoracic spinal cord stimulator. His interoperative course was uncomplicated. He was monitored in the PACU post-operatively and discharge home after ambulating, urinating, and tolerating PO intake. He was given medications for pain  Consults: None  Significant Diagnostic Studies: none  Treatments: surgery: As above. Please see separately dictate operative report for further details  Discharge Exam: Blood pressure 130/76, pulse 69, temperature (!) 97 F (36.1 C), temperature source Temporal, resp. rate 16, height 5\' 7"  (1.702 m), weight 102.9 kg, SpO2 94 %. CN II-XII grossly intact Good and equal strength in BLE Incisions c/d/I   Disposition: Discharge disposition: 01-Home or Self Care       Discharge Instructions     Remove dressing in 48 hours   Complete by: As directed       Allergies as of 05/26/2021       Reactions   Colchicine Other (See Comments)   Palpitations and headache   Lisinopril Cough        Medication List     STOP taking these medications    aspirin 81 MG tablet       TAKE these medications    allopurinol 300 MG tablet Commonly known as: ZYLOPRIM Take 1 tablet (300 mg total) by mouth daily.   amLODipine 5 MG tablet Commonly known as: NORVASC Take 1 tablet (5 mg total) by mouth 2 (two) times daily.   atorvastatin 40 MG tablet Commonly known as: LIPITOR Take 1 tablet (40 mg total) by mouth daily.   carvedilol 25 MG tablet Commonly known as: COREG Take 1 tablet (25 mg total) by mouth 2 (two) times daily with a meal.   cyclobenzaprine 10 MG  tablet Commonly known as: FLEXERIL TAKE ONE TABLET BY MOUTH THREE TIMES A DAY AS NEEDED FOR MUSCLE SPASMS   ezetimibe 10 MG tablet Commonly known as: ZETIA Take 1 tablet (10 mg total) by mouth daily.   freestyle lancets USE TO CHECK BLOOD SUGAR ONCE DAILY   FreeStyle Lite Devi To check blood sugar once daily   FREESTYLE LITE test strip Generic drug: glucose blood USE TO CHECK BLOOD SUGAR ONCE DAILY   isosorbide mononitrate 60 MG 24 hr tablet Commonly known as: IMDUR Take 1 tablet (60 mg total) by mouth daily. PLEASE CALL TO SCHEDULE OFFICE VISIT FOR FURTHER REFILLS. THANK YOU!   losartan 100 MG tablet Commonly known as: COZAAR TAKE ONE TABLET MY MOUTH DAILY   meloxicam 15 MG tablet Commonly known as: MOBIC Take 1 tablet (15 mg total) by mouth daily.   metFORMIN 500 MG 24 hr tablet Commonly known as: GLUCOPHAGE-XR TAKE TWO TABLETS BY MOUTH TWICE A DAY WITH A MEAL   multivitamin with minerals Tabs tablet Take 1 tablet by mouth daily.   naproxen sodium 220 MG tablet Commonly known as: ALEVE Take 440 mg by mouth daily as needed (pain).   nitroGLYCERIN 0.4 MG SL tablet Commonly known as: NITROSTAT Place 1 tablet (0.4 mg total) under the tongue every 5 (five) minutes as needed for chest pain.   oxyCODONE 5 MG immediate release tablet Commonly known as: Roxicodone Take 1 tablet (5 mg total) by mouth every 6 (six)  hours as needed for up to 5 days for severe pain.   pantoprazole 40 MG tablet Commonly known as: PROTONIX Take 40 mg by mouth daily.   potassium chloride 10 MEQ tablet Commonly known as: KLOR-CON Take 1 tablet (10 mEq total) by mouth 2 (two) times daily.   pregabalin 150 MG capsule Commonly known as: LYRICA TAKE 1 CAPSULE BY MOUTH TWO TIMES A DAY   senna 8.6 MG Tabs tablet Commonly known as: SENOKOT Take 1 tablet by mouth daily.   torsemide 20 MG tablet Commonly known as: DEMADEX Take 2 tablets (40 mg total) by mouth 2 (two) times daily.    Trulicity 1.5 MG/0.5ML Sopn Generic drug: Dulaglutide INJECT 1.5 MG UNDER THE SKIN ONCE A WEEK        Follow-up Information     Susanne Borders, PA Follow up in 2 week(s).   Why: for incision check. This appointment should already be scheduled through the [facility]. Please call the office with any questions or concerns regarding appointment date or time.  Tuesday December 27th, 2022 Contact information: [address] [place] [address] [phone]                 Signed: Susanne Borders 05/26/2021, 2:06 PM

## 2021-05-26 NOTE — Transfer of Care (Signed)
Immediate Anesthesia Transfer of Care Note  Patient: Jonathon Newman  Procedure(s) Performed: THORACIC SPINAL CORD STIMULATOR AND PULSE GENERATOR PLACEMENT (MEDTRONIC) (Back)  Patient Location: PACU  Anesthesia Type:General  Level of Consciousness: awake, drowsy and patient cooperative  Airway & Oxygen Therapy: Patient Spontanous Breathing and Patient connected to face mask oxygen  Post-op Assessment: Report given to RN and Post -op Vital signs reviewed and stable  Post vital signs: Reviewed and stable  Last Vitals:  Vitals Value Taken Time  BP 154/91 05/26/21 1154  Temp 36.3 C 05/26/21 1154  Pulse 71 05/26/21 1158  Resp 12 05/26/21 1158  SpO2 96 % 05/26/21 1158  Vitals shown include unvalidated device data.  Last Pain:  Vitals:   05/26/21 1154  TempSrc:   PainSc: Asleep         Complications: No notable events documented.

## 2021-05-26 NOTE — Discharge Instructions (Addendum)
NEUROSURGERY DISCHARGE INSTRUCTIONS  Admission diagnosis: Chronic pain g89.4 Postlaminectomy syndrome M96.1  Operative procedure: Placement of Spinal Cord Stimulator and pulse generator   What to do after you leave the hospital:  Recommended diet: cardiac diet. Increase protein intake to promote wound healing.  Recommended activity: no lifting, driving, or strenuous exercise for 6 weeks . You should walk multiple times per day  Special Instructions  No straining, no heavy lifting > 10lbs x 4 weeks.  Keep incision area clean and dry. May shower in 2 days. No baths or pools for 6 weeks.    You have no sutures to remove, the skin is closed with adhesive. You may remove dressings in two days. no need to apply a bandage afterwards  Please take pain medications as directed. Take a stool softener if on pain medications  You may resume your home Aspirin in 7 days  Please Report any of the following: Nausea or Vomiting, Temperature is greater than 101.33F (38.1C) degrees, Dizziness, Abdominal Pain, Difficulty Breathing or Shortness of Breath, Inability to Eat, drink Fluids, or Take medications, Bleeding, swelling, or drainage from surgical incision sites, New numbness or weakness, and Bowel or bladder dysfunction to the neurosurgeon on call at [phone]  Additional Follow up appointments Please follow up with Manning Charity PA-C in [place] clinic as scheduled in 2-3 weeks   Please see below for scheduled appointments:  Future Appointments  Date Time Provider Department Center  11/05/2021  8:00 AM Antonieta Iba, MD [facility]     AMBULATORY SURGERY  DISCHARGE INSTRUCTIONS   The drugs that you were given will stay in your system until tomorrow so for the next 24 hours you should not:  Drive an automobile Make any legal decisions Drink any alcoholic beverage   You may resume regular meals tomorrow.  Today it is better to start with liquids and gradually work up to  solid foods.  You may eat anything you prefer, but it is better to start with liquids, then soup and crackers, and gradually work up to solid foods.   Please notify your doctor immediately if you have any unusual bleeding, trouble breathing, redness and pain at the surgery site, drainage, fever, or pain not relieved by medication.    Additional Instructions:  Please contact your physician with any problems or Same Day Surgery at [phone], Monday through Friday 6 am to 4 pm, or [place] [facility] number at [phone].

## 2021-05-26 NOTE — Interval H&P Note (Signed)
History and Physical Interval Note:  05/26/2021 9:54 AM  Jonathon Newman  has presented today for surgery, with the diagnosis of Chronic pain g89.4 Postlaminectomy syndrome M96.1.  The various methods of treatment have been discussed with the patient and family. After consideration of risks, benefits and other options for treatment, the patient has consented to  Procedure(s): THORACIC SPINAL CORD STIMULATOR AND PULSE GENERATOR PLACEMENT (MEDTRONIC) (N/A) as a surgical intervention.  [name] history has been reviewed, patient examined, no change in status, stable for surgery.  I have reviewed the patient's chart and labs.  Questions were answered to the patient's satisfaction.     Lucy Chris

## 2021-05-26 NOTE — Anesthesia Postprocedure Evaluation (Signed)
Anesthesia Post Note  Patient: Waymond Meador  Procedure(s) Performed: THORACIC SPINAL CORD STIMULATOR AND PULSE GENERATOR PLACEMENT (MEDTRONIC) (Back)  Patient location during evaluation: PACU Anesthesia Type: General Level of consciousness: awake and alert Pain management: pain level controlled Vital Signs Assessment: post-procedure vital signs reviewed and stable Respiratory status: spontaneous breathing, nonlabored ventilation and respiratory function stable Cardiovascular status: blood pressure returned to baseline and stable Postop Assessment: no apparent nausea or vomiting Anesthetic complications: no   No notable events documented.   Last Vitals:  Vitals:   05/26/21 1330 05/26/21 1345  BP: 120/70 130/76  Pulse: 68 69  Resp: 15 16  Temp: 36.6 C (!) 36.1 C  SpO2: 96% 94%    Last Pain:  Vitals:   05/26/21 1445  TempSrc:   PainSc: 5                  Foye Deer

## 2021-05-26 NOTE — Progress Notes (Signed)
Patient able to ambulate around unit .

## 2021-05-26 NOTE — Anesthesia Preprocedure Evaluation (Addendum)
Anesthesia Evaluation  Patient identified by MRN, date of birth, ID band Patient awake    Reviewed: Allergy & Precautions, H&P , NPO status , Patient's Chart, lab work & pertinent test results  Airway Mallampati: III  TM Distance: >3 FB Neck ROM: Full    Dental no notable dental hx. (+) Poor Dentition   Pulmonary shortness of breath and with exertion, sleep apnea and Continuous Positive Airway Pressure Ventilation , former smoker,    Pulmonary exam normal        Cardiovascular Exercise Tolerance: Poor hypertension, + CAD, + Past MI (2016), + CABG (2016 2V), +CHF and + DOE  Normal cardiovascular exam  2019 -1. Significant underlying left main and RCA disease with patent grafts including LIMA to LAD and SVG to OM 2.  Patent RCA stent with minimal restenosis. 2.  Normal LV systolic function.  Mildly elevated left ventricular end-diastolic pressure at 14 to 15 mmHg.    Neuro/Psych Foot drop, left  Neuromuscular disease (Lumbar radiculopathy. Postlaminectomy syndrome, Neuropathy) negative psych ROS   GI/Hepatic Neg liver ROS, GERD  Medicated and Controlled,Dysphagia   Endo/Other  diabetes (A1c 6.4 3 months ago. Pt ahs history of poor control), Well Controlled, Type 2, Oral Hypoglycemic Agents  Renal/GU CRFRenal disease  negative genitourinary   Musculoskeletal  (+) Arthritis ,   Abdominal Normal abdominal exam  (+)   Peds negative pediatric ROS (+)  Hematology negative hematology ROS (+)   Anesthesia Other Findings   Reproductive/Obstetrics negative OB ROS                           Anesthesia Physical Anesthesia Plan  ASA: 3  Anesthesia Plan: General   Post-op Pain Management:    Induction: Intravenous  PONV Risk Score and Plan: 2 and Ondansetron, Dexamethasone and Treatment may vary due to age or medical condition  Airway Management Planned: Oral ETT  Additional Equipment:   Intra-op  Plan:   Post-operative Plan: Extubation in OR  Informed Consent: I have reviewed the patients History and Physical, chart, labs and discussed the procedure including the risks, benefits and alternatives for the proposed anesthesia with the patient or authorized representative who has indicated his/her understanding and acceptance.     Dental advisory given  Plan Discussed with: CRNA and Anesthesiologist  Anesthesia Plan Comments:        Anesthesia Quick Evaluation

## 2021-05-26 NOTE — Anesthesia Procedure Notes (Signed)
Procedure Name: Intubation Date/Time: 05/26/2021 10:40 AM Performed by: Henrietta Hoover, CRNA Pre-anesthesia Checklist: Patient identified, Emergency Drugs available, Suction available and Patient being monitored Patient Re-evaluated:Patient Re-evaluated prior to induction Oxygen Delivery Method: Circle system utilized Preoxygenation: Pre-oxygenation with 100% oxygen Induction Type: IV induction, Cricoid Pressure applied and Rapid sequence Ventilation: Mask ventilation without difficulty Laryngoscope Size: McGraph and 4 Grade View: Grade I Tube type: Oral Tube size: 7.0 mm Number of attempts: 1 Airway Equipment and Method: Stylet and Video-laryngoscopy Placement Confirmation: ETT inserted through vocal cords under direct vision, positive ETCO2 and breath sounds checked- equal and bilateral Secured at: 21 cm Tube secured with: Tape Dental Injury: Teeth and Oropharynx as per pre-operative assessment

## 2021-05-26 NOTE — H&P (Signed)
Jonathon Newman is an 65 y.o. male.   Chief Complaint: Chronic pain syndrome HPI: Jonathon Newman is here for evaluation of chronic pain in the back and legs. He states that this is happened over the past year and a half. He does have a history of multiple lumbar decompressions in 2019 followed by an additional 1 6 months after the first. He states this was done for more weakness in his legs and he feels that the symptoms he is currently having are new. The pain will start in the back go down to the hip area and into the front of the thighs. He does endorsed some numbness and weakness in his feet with the weakness occur mostly on the left foot. He has undergone multiple treatments in the past and was seen by Dr. Marcell Barlow where no surgery was recommended for the lumbar spine but he was sent for spinal cord stimulator trial. He underwent this trial and felt that he got almost 90% relief of his symptoms. He is here for the permanent implant  Past Medical History:  Diagnosis Date   Anginal pain (HCC)    Arthritis    BPH (benign prostatic hyperplasia)    CHF (congestive heart failure) (HCC)    Coronary artery disease 2010   a.) LHC 2010: high grade RCA stenosis -> 2.5 x 77mm Cypher DES to RCA. b.) NSTEMI 2016 -> PCI revealed pat RCA stent; sig dLM Dz with FFR ratio 0.76 and oLCx stenosis; ref to CVTS. c.) 2v CABG 04/21/2015; LIMA-LAD, SVG-OM2. d.) [facility] 05/02/2018: EF 55-65%; LVEDP 14-15 mmHg; 10% p-m RCA, 30% pRCA, 20% dRCA, 95% o-pLCx, 80% m-dLM; LIMA graft pat. SVG graft with mild diffuse Dz.   DDD (degenerative disc disease), lumbar    Dyspnea    GERD (gastroesophageal reflux disease)    Hypercholesteremia    Hypertension    Left foot drop    NSTEMI (non-ST elevated myocardial infarction) (HCC) 2016   a.) PCI revealed patent RCA stent; significant dLM disease with FFR ratio 0.76 and oLCx stenosis; refer to CVTS for CABG.   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 LIMA to LAD amd SVG to OM2   CORONARY STENT PLACEMENT  2010   Cordis Cypher Sirolimus-eluting stent 2.50 mm x 26 mm placed to the RCA at [facility]    ESOPHAGEAL MANOMETRY N/A 08/04/2017   Procedure: ESOPHAGEAL MANOMETRY (EM);  Surgeon: Toney Reil, MD;  Location: [facility] ENDOSCOPY;  Service: Endoscopy;  Laterality: N/A;   FRACTURE SURGERY     KNEE SURGERY     KNEE SURGERY     right knee    LAMINECTOMY     "plate in neck E5-I7"   LEFT HEART CATH AND CORS/GRAFTS ANGIOGRAPHY N/A 05/02/2018   Procedure: CORS/GRAFTS ANGIOGRAPHY;  Surgeon: Iran Ouch, MD;  Location: [facility] INVASIVE CV LAB;  Service: Cardiovascular;  Laterality: N/A;   RIGHT/LEFT HEART CATH AND CORONARY ANGIOGRAPHY Bilateral 05/02/2018  Procedure: LEFT HEART CATH;  Surgeon: Iran Ouch, MD;  Location: [facility] INVASIVE CV LAB;  Service: Cardiovascular;  Laterality: Bilateral;   VASECTOMY      Family History  Problem Relation Age of Onset   Arthritis Mother    Hyperlipidemia Father    Hypertension Father    Heart attack Father 51   Heart murmur Brother    Valvular heart disease Brother    Cataracts Maternal Grandmother    Glaucoma Maternal Grandmother    Cancer Maternal Grandfather    Heart attack Paternal Grandfather    Hypertension Paternal Grandfather    Prostate cancer Neg Hx    Bladder Cancer Neg Hx    Kidney cancer Neg Hx    Social History:  reports that he quit smoking about 16 years ago. His smoking use included cigarettes. He has a 0.50 pack-year smoking history. He has quit using smokeless tobacco.  His smokeless  tobacco use included chew. He reports that he does not drink alcohol and does not use drugs.  Allergies:  Allergies  Allergen Reactions   Colchicine Other (See Comments)    Palpitations and headache   Lisinopril Cough    Medications Prior to Admission  Medication Sig Dispense Refill   allopurinol (ZYLOPRIM) 300 MG tablet Take 1 tablet (300 mg total) by mouth daily. 90 tablet 3   amLODipine (NORVASC) 5 MG tablet Take 1 tablet (5 mg total) by mouth 2 (two) times daily. 180 tablet 3   aspirin 81 MG tablet Take 81 mg by mouth daily.     atorvastatin (LIPITOR) 40 MG tablet Take 1 tablet (40 mg total) by mouth daily. 90 tablet 3   carvedilol (COREG) 25 MG tablet Take 1 tablet (25 mg total) by mouth 2 (two) times daily with a meal. 180 tablet 3   cyclobenzaprine (FLEXERIL) 10 MG tablet TAKE ONE TABLET BY MOUTH THREE TIMES A DAY AS NEEDED FOR MUSCLE SPASMS 90 tablet 1   ezetimibe (ZETIA) 10 MG tablet Take 1 tablet (10 mg total) by mouth daily. 90 tablet 3   isosorbide mononitrate (IMDUR) 60 MG 24 hr tablet Take 1 tablet (60 mg total) by mouth daily. PLEASE CALL TO SCHEDULE OFFICE VISIT FOR FURTHER REFILLS. THANK YOU! 90 tablet 3   losartan (COZAAR) 100 MG tablet TAKE ONE TABLET MY MOUTH DAILY 90 tablet 3   meloxicam (MOBIC) 15 MG tablet Take 1 tablet (15 mg total) by mouth daily. 90 tablet 0   metFORMIN (GLUCOPHAGE-XR) 500 MG 24 hr tablet TAKE TWO TABLETS BY MOUTH TWICE A DAY WITH A MEAL 360 tablet 0   Multiple Vitamin (MULTIVITAMIN WITH MINERALS) TABS tablet Take 1 tablet by mouth daily.     naproxen sodium (ALEVE) 220 MG tablet Take 440 mg by mouth daily as needed (pain).     nitroGLYCERIN (NITROSTAT) 0.4 MG SL tablet Place 1 tablet (0.4 mg total) under the tongue every 5 (five) minutes as needed for chest pain. 25 tablet 3   pantoprazole (PROTONIX) 40 MG tablet Take 40 mg by mouth daily.     potassium chloride (KLOR-CON) 10 MEQ tablet Take 1 tablet (10 mEq total) by mouth 2 (two) times daily.  180 tablet 3   pregabalin (LYRICA) 150 MG capsule TAKE 1 CAPSULE BY MOUTH TWO TIMES A DAY 180 capsule 0   senna (SENOKOT) 8.6 MG TABS tablet Take 1 tablet by mouth daily.     torsemide (DEMADEX) 20 MG tablet Take 2 tablets (40 mg total) by mouth 2 (  two) times daily. 360 tablet 3   TRULICITY 1.5 MG/0.5ML SOPN INJECT 1.5 MG UNDER THE SKIN ONCE A WEEK 6 mL 3   Blood Glucose Monitoring Suppl (FREESTYLE LITE) DEVI To check blood sugar once daily 1 each 0   glucose blood (FREESTYLE LITE) test strip USE TO CHECK BLOOD SUGAR ONCE DAILY 100 strip 4   Lancets (FREESTYLE) lancets USE TO CHECK BLOOD SUGAR ONCE DAILY 100 each 4    Results for orders placed or performed during the hospital encounter of 05/26/21 (from the past 48 hour(s))  Glucose, capillary     Status: Abnormal   Collection Time: 05/26/21  9:05 AM  Result Value Ref Range   Glucose-Capillary 165 (H) 70 - 99 mg/dL    Comment: Glucose reference range applies only to samples taken after fasting for at least 8 hours.  ABO/Rh     Status: None (Preliminary result)   Collection Time: 05/26/21  9:06 AM  Result Value Ref Range   ABO/RH(D) PENDING    No results found.  Review of Systems General ROS: Negative Psychological ROS: Negative Ophthalmic ROS: Negative ENT ROS: Negative Hematological and Lymphatic ROS: Negative  Endocrine ROS: Negative Respiratory ROS: Negative Cardiovascular ROS: Negative Gastrointestinal ROS: Negative Genito-Urinary ROS: Negative Musculoskeletal ROS: Positive for back pain Neurological ROS: Positive for leg pain, numbness, weakness Dermatological ROS: Negative Blood pressure 133/83, pulse 82, temperature 98.1 F (36.7 C), temperature source Oral, resp. rate 16, height 5\' 7"  (1.702 m), weight 102.9 kg, SpO2 98 %. Physical Exam  General appearance: Alert, cooperative, in no acute distress Head: Normocephalic, atraumatic Eyes: Normal, EOM intact Oropharynx: Moist without lesions Back: Well-healed midline  incision Ext: No edema in LE bilaterally  Neurologic exam:  Mental status: alertness: alert, affect: normal Speech: fluent and clear Motor:strength symmetric 5/5 in bilateral hip flexion, knee extension, knee flexion, plantar flexion. He is 5-5 in right dorsiflexion and 4- out of 5 in left dorsiflexion Sensory: intact to light touch in bilateral lower extremities with the exception of the medial right toe and the left foot Gait: normal  Imaging: MRI thoracic spine: There is a normal kyphotic curvature. There is no significant degenerative disease or central stenosis.  MRI lumbar spine: There is a lordotic curvature. There is evidence of a L4-5 and L5-S1 decompression. There is no significant stenosis above this. Assessment/Plan Proceed with thoracic spinal cord stimulator  , MD 05/26/2021, 9:52 AM

## 2021-05-27 ENCOUNTER — Encounter: Payer: Self-pay | Admitting: Neurosurgery

## 2021-06-10 ENCOUNTER — Other Ambulatory Visit: Payer: Self-pay | Admitting: Family Medicine

## 2021-06-10 DIAGNOSIS — M5137 Other intervertebral disc degeneration, lumbosacral region: Secondary | ICD-10-CM

## 2021-06-10 DIAGNOSIS — M47817 Spondylosis without myelopathy or radiculopathy, lumbosacral region: Secondary | ICD-10-CM

## 2021-06-10 DIAGNOSIS — M5416 Radiculopathy, lumbar region: Secondary | ICD-10-CM

## 2021-06-10 MED ORDER — PREGABALIN 150 MG PO CAPS: ORAL_CAPSULE | ORAL | 0 refills | Status: DC

## 2021-06-10 NOTE — Telephone Encounter (Signed)
Please review refill request, last office visit 03/31/21 medication last filled 03/13/21. KW

## 2021-06-10 NOTE — Telephone Encounter (Signed)
[facility] faxed refill request for the following medications:  pregabalin (LYRICA) 150 MG capsule   Please advise.

## 2021-06-21 ENCOUNTER — Other Ambulatory Visit: Payer: Self-pay | Admitting: Family Medicine

## 2021-06-21 NOTE — Telephone Encounter (Signed)
Requested Prescriptions  Pending Prescriptions Disp Refills   meloxicam (MOBIC) 7.5 MG tablet [Pharmacy Med Name: MELOXICAM 7.5 MG TABLET] 30 tablet 5    Sig: TAKE ONE TABLET BY MOUTH DAILY     Analgesics:  COX2 Inhibitors Failed - 06/21/2021  6:51 AM      Failed - HGB in normal range and within 360 days    Hemoglobin  Date Value Ref Range Status  05/12/2021 12.7 (L) 13.0 - 17.0 g/dL Final  93/81/0175 10.2 13.0 - 17.7 g/dL Final         Failed - Cr in normal range and within 360 days    Creatinine, Ser  Date Value Ref Range Status  05/12/2021 1.28 (H) 0.61 - 1.24 mg/dL Final         Passed - Patient is not pregnant      Passed - Valid encounter within last 12 months    Recent Outpatient Visits          2 months ago History of recent fall   [facility] Merita Norton T, FNP   5 months ago Type 2 diabetes mellitus with diabetic peripheral angiopathy without gangrene, without long-term current use of insulin [facility])   [facility] [place], Marzella Schlein, MD   6 months ago Type 2 diabetes mellitus with hyperosmolarity without coma, without long-term current use of insulin [facility])   [facility] Chrismon, Jodell Cipro, PA-C   7 months ago Lumbar radiculopathy   [employer], Jodell Cipro, PA-C   8 months ago Type 2 diabetes mellitus with diabetic peripheral angiopathy without gangrene, without long-term current use of insulin (HCC)   [facility] Flinch[place], Eula Fried, FNP      Future Appointments            In 4 months Gollan, Tollie Pizza, MD [facility]

## 2021-06-23 ENCOUNTER — Telehealth: Payer: Self-pay

## 2021-06-23 ENCOUNTER — Other Ambulatory Visit: Payer: Self-pay | Admitting: Family Medicine

## 2021-06-23 MED ORDER — PANTOPRAZOLE SODIUM 40 MG PO TBEC: 40.0000 mg | DELAYED_RELEASE_TABLET | Freq: Every day | ORAL | 3 refills | Status: DC

## 2021-06-23 NOTE — Telephone Encounter (Signed)
[facility] faxed refill request for the following medications:  pantoprazole (PROTONIX) 40 MG tablet   Please advise.  

## 2021-06-25 ENCOUNTER — Encounter: Payer: Self-pay | Admitting: Family Medicine

## 2021-06-30 NOTE — Progress Notes (Signed)
Established patient visit   Patient: Jonathon Newman   DOB: [birth year]   66 y.o. Male  MRN: [id number] Visit Date: 07/01/2021  Today's healthcare provider: Jacky Kindle, FNP   Chief Complaint  Patient presents with   Psoriasis   Subjective    Psoriasis This is a new problem. The current episode started 1 to 4 weeks ago. The affected locations include the scalp. Associated symptoms include itching. Treatments tried: selsium Blue. The treatment provided no relief. He has been using treatment for 1 to 4 weeks.     Medications: Outpatient Medications Prior to Visit  Medication Sig   allopurinol (ZYLOPRIM) 300 MG tablet Take 1 tablet (300 mg total) by mouth daily.   amLODipine (NORVASC) 5 MG tablet Take 1 tablet (5 mg total) by mouth 2 (two) times daily.   atorvastatin (LIPITOR) 40 MG tablet Take 1 tablet (40 mg total) by mouth daily.   Blood Glucose Monitoring Suppl (FREESTYLE LITE) DEVI To check blood sugar once daily   carvedilol (COREG) 25 MG tablet Take 1 tablet (25 mg total) by mouth 2 (two) times daily with a meal.   cyclobenzaprine (FLEXERIL) 10 MG tablet TAKE ONE TABLET BY MOUTH THREE TIMES A DAY AS NEEDED FOR MUSCLE SPASMS   ezetimibe (ZETIA) 10 MG tablet Take 1 tablet (10 mg total) by mouth daily.   glucose blood (FREESTYLE LITE) test strip USE TO CHECK BLOOD SUGAR ONCE DAILY   isosorbide mononitrate (IMDUR) 60 MG 24 hr tablet Take 1 tablet (60 mg total) by mouth daily. PLEASE CALL TO SCHEDULE OFFICE VISIT FOR FURTHER REFILLS. THANK YOU!   Lancets (FREESTYLE) lancets USE TO CHECK BLOOD SUGAR ONCE DAILY   losartan (COZAAR) 100 MG tablet TAKE ONE TABLET MY MOUTH DAILY   meloxicam (MOBIC) 15 MG tablet Take 1 tablet (15 mg total) by mouth daily.   metFORMIN (GLUCOPHAGE-XR) 500 MG 24 hr tablet TAKE TWO TABLETS BY MOUTH TWICE A DAY WITH A MEAL   Multiple Vitamin (MULTIVITAMIN WITH MINERALS) TABS tablet Take 1 tablet by mouth daily.   naproxen sodium (ALEVE) 220 MG tablet  Take 440 mg by mouth daily as needed (pain).   nitroGLYCERIN (NITROSTAT) 0.4 MG SL tablet Place 1 tablet (0.4 mg total) under the tongue every 5 (five) minutes as needed for chest pain.   pantoprazole (PROTONIX) 40 MG tablet Take 1 tablet (40 mg total) by mouth daily.   potassium chloride (KLOR-CON) 10 MEQ tablet Take 1 tablet (10 mEq total) by mouth 2 (two) times daily.   pregabalin (LYRICA) 150 MG capsule TAKE 1 CAPSULE BY MOUTH TWO TIMES A DAY   senna (SENOKOT) 8.6 MG TABS tablet Take 1 tablet by mouth daily.   torsemide (DEMADEX) 20 MG tablet Take 2 tablets (40 mg total) by mouth 2 (two) times daily.   TRULICITY 1.5 MG/0.5ML SOPN INJECT 1.5 MG UNDER THE SKIN ONCE A WEEK   No facility-administered medications prior to visit.    Review of Systems  Skin:  Positive for itching.      Objective    BP 119/70    Pulse 79    Resp 16    Wt 233 lb 4.8 oz (105.8 kg)    SpO2 99%    BMI 36.54 kg/m    Physical Exam Vitals and nursing note reviewed.  Constitutional:      Appearance: Normal appearance. He is obese.  HENT:     Head: Normocephalic and atraumatic.      Comments:  Generalized redness below hair line Denies change in shampoo, soap, etc Eyes:     Pupils: Pupils are equal, round, and reactive to light.  Cardiovascular:     Rate and Rhythm: Normal rate and regular rhythm.     Pulses: Normal pulses.     Heart sounds: Normal heart sounds.  Pulmonary:     Effort: Pulmonary effort is normal.     Breath sounds: Normal breath sounds.  Musculoskeletal:        General: Normal range of motion.     Cervical back: Normal range of motion.  Skin:    General: Skin is warm and dry.     Capillary Refill: Capillary refill takes less than 2 seconds.     Findings: Erythema present.  Neurological:     General: No focal deficit present.     Mental Status: He is alert and oriented to person, place, and time. Mental status is at baseline.  Psychiatric:        Mood and Affect: Mood normal.         Behavior: Behavior normal.        Thought Content: Thought content normal.        Judgment: Judgment normal.     No results found for any visits on 07/01/21.  Assessment & Plan     Problem List Items Addressed This Visit       Cardiovascular and Mediastinum   Type 2 diabetes mellitus with diabetic peripheral angiopathy without gangrene, without long-term current use of insulin (HCC)    Stable Report of foot pain, likely Morton's neuroma Referral to podiatry         Nervous and Auditory   Lumbar radiculopathy    Chronic, stable Request for medication refill      Morton's neuroma of both feet    Request referral to podiatry        Relevant Orders   Ambulatory referral to Podiatry     Musculoskeletal and Integument   DDD (degenerative disc disease), lumbosacral    S/p implant Doing well since implant Continues to wear back brace      Psoriasis of scalp - Primary    Has tried OTC medication Additional of topical antifungal and prn steroid       Relevant Medications   ketoconazole (NIZORAL) 2 % shampoo (Start on 07/03/2021)   clobetasol (TEMOVATE) 0.05 % external solution     Other   Morbid obesity (HCC)    BMI >35 HTN DM      Other Visit Diagnoses     Chronic diastolic CHF (congestive heart failure) (HCC)   (Chronic)          Return if symptoms worsen or fail to improve.      Leilani Merl, FNP, have reviewed all documentation for this visit. The documentation on 07/01/21 for the exam, diagnosis, procedures, and orders are all accurate and complete.  Patient seen and examined by Merita Norton,  FNP note scribed by Sheliah Hatch, NCMA   Jacky Kindle, FNP  [facility] [phone] (phone) [phone] (fax)  [facility]

## 2021-07-01 ENCOUNTER — Other Ambulatory Visit: Payer: Self-pay

## 2021-07-01 ENCOUNTER — Encounter: Payer: Self-pay | Admitting: Family Medicine

## 2021-07-01 ENCOUNTER — Ambulatory Visit (INDEPENDENT_AMBULATORY_CARE_PROVIDER_SITE_OTHER): Payer: Medicare PPO | Admitting: Family Medicine

## 2021-07-01 VITALS — BP 119/70 | HR 79 | Resp 16 | Wt 233.3 lb

## 2021-07-01 DIAGNOSIS — I5032 Chronic diastolic (congestive) heart failure: Secondary | ICD-10-CM

## 2021-07-01 DIAGNOSIS — E1151 Type 2 diabetes mellitus with diabetic peripheral angiopathy without gangrene: Secondary | ICD-10-CM

## 2021-07-01 DIAGNOSIS — L409 Psoriasis, unspecified: Secondary | ICD-10-CM

## 2021-07-01 DIAGNOSIS — M5416 Radiculopathy, lumbar region: Secondary | ICD-10-CM | POA: Diagnosis not present

## 2021-07-01 DIAGNOSIS — M5137 Other intervertebral disc degeneration, lumbosacral region: Secondary | ICD-10-CM

## 2021-07-01 DIAGNOSIS — G5763 Lesion of plantar nerve, bilateral lower limbs: Secondary | ICD-10-CM

## 2021-07-01 DIAGNOSIS — M47817 Spondylosis without myelopathy or radiculopathy, lumbosacral region: Secondary | ICD-10-CM

## 2021-07-01 MED ORDER — KETOCONAZOLE 2 % EX SHAM: 1.0000 "application " | MEDICATED_SHAMPOO | CUTANEOUS | 1 refills | Status: DC

## 2021-07-01 MED ORDER — CLOBETASOL PROPIONATE 0.05 % EX SOLN: 1.0000 "application " | Freq: Two times a day (BID) | CUTANEOUS | 1 refills | Status: DC

## 2021-07-01 MED ORDER — CYCLOBENZAPRINE HCL 10 MG PO TABS: ORAL_TABLET | ORAL | 1 refills | Status: DC

## 2021-07-01 NOTE — Assessment & Plan Note (Signed)
S/p implant Doing well since implant Continues to wear back brace

## 2021-07-01 NOTE — Assessment & Plan Note (Signed)
Request referral to podiatry.  

## 2021-07-01 NOTE — Assessment & Plan Note (Signed)
Chronic, stable Request for medication refill  

## 2021-07-01 NOTE — Telephone Encounter (Signed)
Please review request lov 03/31/21, patient does have office visit today.  Lyrica last filled 06/10/21 Cyclobenzaprine last filled 02/07/21. KW

## 2021-07-01 NOTE — Assessment & Plan Note (Signed)
BMI >35 HTN DM

## 2021-07-01 NOTE — Assessment & Plan Note (Signed)
Has tried OTC medication Additional of topical antifungal and prn steroid

## 2021-07-01 NOTE — Assessment & Plan Note (Signed)
Stable Report of foot pain, likely Morton's neuroma Referral to podiatry

## 2021-07-01 NOTE — Telephone Encounter (Signed)
[facility] faxed refill request for the following medications:  pregabalin (LYRICA) 150 MG capsule  cyclobenzaprine (FLEXERIL) 10 MG tablet   Please advise.

## 2021-07-02 ENCOUNTER — Telehealth: Payer: Self-pay

## 2021-07-02 NOTE — Telephone Encounter (Signed)
[facility] faxed refill request for the following medications:  pregabalin (LYRICA) 150 MG capsule  Please advise.

## 2021-07-02 NOTE — Telephone Encounter (Signed)
Last filled 06/10/21, last office visit 07/01/21. KW

## 2021-07-03 ENCOUNTER — Other Ambulatory Visit: Payer: Self-pay

## 2021-07-03 ENCOUNTER — Other Ambulatory Visit: Payer: Self-pay | Admitting: Family Medicine

## 2021-07-03 ENCOUNTER — Ambulatory Visit (INDEPENDENT_AMBULATORY_CARE_PROVIDER_SITE_OTHER)

## 2021-07-03 ENCOUNTER — Ambulatory Visit (INDEPENDENT_AMBULATORY_CARE_PROVIDER_SITE_OTHER): Admitting: Podiatry

## 2021-07-03 ENCOUNTER — Telehealth: Payer: Self-pay

## 2021-07-03 DIAGNOSIS — G5763 Lesion of plantar nerve, bilateral lower limbs: Secondary | ICD-10-CM

## 2021-07-03 DIAGNOSIS — M79671 Pain in right foot: Secondary | ICD-10-CM

## 2021-07-03 DIAGNOSIS — M5137 Other intervertebral disc degeneration, lumbosacral region: Secondary | ICD-10-CM

## 2021-07-03 DIAGNOSIS — M47817 Spondylosis without myelopathy or radiculopathy, lumbosacral region: Secondary | ICD-10-CM

## 2021-07-03 DIAGNOSIS — M5416 Radiculopathy, lumbar region: Secondary | ICD-10-CM

## 2021-07-03 MED ORDER — PREGABALIN 150 MG PO CAPS: ORAL_CAPSULE | ORAL | 3 refills | Status: DC

## 2021-07-03 NOTE — Telephone Encounter (Signed)
Patient brought a prescription by for DM shoe insoles written by Dr. Allena Katz at [facility] (I am sending this back on a green chart)   He said that Dr. Allena Katz told him to have his primary care physician must write an rx like his and send it into [facility] so that he may get these.   Plz see Rx when it comes into your box.

## 2021-07-03 NOTE — Telephone Encounter (Signed)
We can look at it together tomorrow. Some of the CMAs are quite good at reading horrible handwriting too, so they might be able to help [place].

## 2021-07-03 NOTE — Telephone Encounter (Signed)
Script is in your box to review. KW

## 2021-07-09 ENCOUNTER — Other Ambulatory Visit: Payer: Self-pay | Admitting: Family Medicine

## 2021-07-09 ENCOUNTER — Telehealth: Payer: Self-pay | Admitting: Family Medicine

## 2021-07-09 MED ORDER — ALLOPURINOL 300 MG PO TABS: 300.0000 mg | ORAL_TABLET | Freq: Every day | ORAL | 3 refills | Status: DC

## 2021-07-09 NOTE — Progress Notes (Signed)
Subjective:  Patient ID: Jonathon Newman, male    DOB: [birth year],  MRN: [id number]  Chief Complaint  Patient presents with   Foot Pain    Bilateral foot pain     66 y.o. male presents with the above complaint.  Patient presents with complaint bilateral second interspace neuroma very mild in nature.  Patient states that he feels like his walking on a ball he feels the same on the left foot.  There is tingling sensation in toes after.  It does not hurt per se but is the weird sensation.  He is a diabetic as well.  He denies seeing anyone else prior to seeing me for this.  He denies any other acute complaints.  He would like to discuss treatment options for this.  He has very mild pain pain scale is 2 out of 10.   Review of Systems: Negative except as noted in the HPI. Denies N/V/F/Ch.  Past Medical History:  Diagnosis Date   Anginal pain (HCC)    Arthritis    BPH (benign prostatic hyperplasia)    CHF (congestive heart failure) (HCC)    Coronary artery disease 2010   a.) LHC 2010: high grade RCA stenosis -> 2.5 x 56mm Cypher DES to RCA. b.) NSTEMI 2016 -> PCI revealed pat RCA stent; sig dLM Dz with FFR ratio 0.76 and oLCx stenosis; ref to CVTS. c.) 2v CABG 04/21/2015; LIMA-LAD, SVG-OM2. d.) [facility] 05/02/2018: EF 55-65%; LVEDP 14-15 mmHg; 10% p-m RCA, 30% pRCA, 20% dRCA, 95% o-pLCx, 80% m-dLM; LIMA graft pat. SVG graft with mild diffuse Dz.   DDD (degenerative disc disease), lumbar    Dyspnea    GERD (gastroesophageal reflux disease)    Hypercholesteremia    Hypertension    Left foot drop    NSTEMI (non-ST elevated myocardial infarction) (HCC) 2016   a.) PCI revealed patent RCA stent; significant dLM disease with FFR ratio 0.76 and oLCx stenosis; refer to CVTS for CABG.   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   a.) 2v CABG; LIMA-LAD, SVG-OM2   T2DM (type 2 diabetes mellitus) (HCC)     Current Outpatient Medications:    allopurinol (ZYLOPRIM) 300 MG  tablet, Take 1 tablet (300 mg total) by mouth daily., Disp: 90 tablet, Rfl: 3   amLODipine (NORVASC) 5 MG tablet, Take 1 tablet (5 mg total) by mouth 2 (two) times daily., Disp: 180 tablet, Rfl: 3   atorvastatin (LIPITOR) 40 MG tablet, Take 1 tablet (40 mg total) by mouth daily., Disp: 90 tablet, Rfl: 3   Blood Glucose Monitoring Suppl (FREESTYLE LITE) DEVI, To check blood sugar once daily, Disp: 1 each, Rfl: 0   carvedilol (COREG) 25 MG tablet, Take 1 tablet (25 mg total) by mouth 2 (two) times daily with a meal., Disp: 180 tablet, Rfl: 3   clobetasol (TEMOVATE) 0.05 % external solution, Apply 1 application topically 2 (two) times daily., Disp: 100 mL, Rfl: 1   cyclobenzaprine (FLEXERIL) 10 MG tablet, TAKE ONE TABLET BY MOUTH THREE TIMES A DAY AS NEEDED FOR MUSCLE SPASMS, Disp: 90 tablet, Rfl: 1   ezetimibe (ZETIA) 10 MG tablet, Take 1 tablet (10 mg total) by mouth daily., Disp: 90 tablet, Rfl: 3   glucose blood (FREESTYLE LITE) test strip, USE TO CHECK BLOOD SUGAR ONCE DAILY, Disp: 100 strip, Rfl: 4   isosorbide mononitrate (IMDUR) 60 MG 24 hr tablet, Take 1 tablet (60 mg total) by mouth daily. PLEASE CALL  TO SCHEDULE OFFICE VISIT FOR FURTHER REFILLS. THANK YOU!, Disp: 90 tablet, Rfl: 3   ketoconazole (NIZORAL) 2 % shampoo, Apply 1 application topically 2 (two) times a week., Disp: 120 mL, Rfl: 1   Lancets (FREESTYLE) lancets, USE TO CHECK BLOOD SUGAR ONCE DAILY, Disp: 100 each, Rfl: 4   losartan (COZAAR) 100 MG tablet, TAKE ONE TABLET MY MOUTH DAILY, Disp: 90 tablet, Rfl: 3   meloxicam (MOBIC) 15 MG tablet, Take 1 tablet (15 mg total) by mouth daily., Disp: 90 tablet, Rfl: 0   metFORMIN (GLUCOPHAGE-XR) 500 MG 24 hr tablet, TAKE TWO TABLETS BY MOUTH TWICE A DAY WITH A MEAL, Disp: 360 tablet, Rfl: 0   Multiple Vitamin (MULTIVITAMIN WITH MINERALS) TABS tablet, Take 1 tablet by mouth daily., Disp: , Rfl:    naproxen sodium (ALEVE) 220 MG tablet, Take 440 mg by mouth daily as needed (pain)., Disp: ,  Rfl:    nitroGLYCERIN (NITROSTAT) 0.4 MG SL tablet, Place 1 tablet (0.4 mg total) under the tongue every 5 (five) minutes as needed for chest pain., Disp: 25 tablet, Rfl: 3   pantoprazole (PROTONIX) 40 MG tablet, Take 1 tablet (40 mg total) by mouth daily., Disp: 90 tablet, Rfl: 3   potassium chloride (KLOR-CON) 10 MEQ tablet, Take 1 tablet (10 mEq total) by mouth 2 (two) times daily., Disp: 180 tablet, Rfl: 3   pregabalin (LYRICA) 150 MG capsule, TAKE 1 CAPSULE BY MOUTH TWO TIMES A DAY, Disp: 180 capsule, Rfl: 3   senna (SENOKOT) 8.6 MG TABS tablet, Take 1 tablet by mouth daily., Disp: , Rfl:    torsemide (DEMADEX) 20 MG tablet, Take 2 tablets (40 mg total) by mouth 2 (two) times daily., Disp: 360 tablet, Rfl: 3   TRULICITY 1.5 MG/0.5ML SOPN, INJECT 1.5 MG UNDER THE SKIN ONCE A WEEK, Disp: 6 mL, Rfl: 3  Social History   Tobacco Use  Smoking Status Former   Packs/day: 0.25   Years: 2.00   Pack years: 0.50   Types: Cigarettes   Quit date: 05/26/2005   Years since quitting: 16.1  Smokeless Tobacco Former   Types: Chew    Allergies  Allergen Reactions   Colchicine Other (See Comments)    Palpitations and headache   Lisinopril Cough   Objective:  There were no vitals filed for this visit. There is no height or weight on file to calculate BMI. Constitutional Well developed. Well nourished.  Vascular Dorsalis pedis pulses palpable bilaterally. Posterior tibial pulses palpable bilaterally. Capillary refill normal to all digits.  No cyanosis or clubbing noted. Pedal hair growth normal.  Neurologic Normal speech. Oriented to person, place, and time. Epicritic sensation to light touch grossly present bilaterally.  Dermatologic Nails well groomed and normal in appearance. No open wounds. No skin lesions.  Orthopedic: Positive Mulder's click noted in bilateral second interspace very mild in nature.  Negative Sullivan sign.  No pain with range of motion of the metatarsophalangeal  joint any of the metatarsophalangeal joint.  Negative plantar plate rupture noted.   Radiographs: 3 views of skeletally mature adult bilateral foot: There is decrease in intermetatarsal space between the second and third metatarsal head leading to likely cause of neuroma.  There is no bony abnormalities identified.  Osteoarthritic changes moderate noted to the metatarsal phalangeal joint.  Assessment:   1. Morton's metatarsalgia, neuralgia, or neuroma, bilateral    Plan:  Patient was evaluated and treated and all questions answered.  Bilateral second interspace neuroma mild -I discussed with the patient  the etiology of neuroma and various treatment options were extensively discussed. -Given that this is very mild in nature I believe patient will benefit from shoe gear modification as well as neuroma pads.  I discussed these with the patient patient states understand will work on that -If he continues to get worse I discussed diagnostic injections versus 4% sclerosing injection versus surgical excision. -If any foot and ankle issues arise in future I will asked him to come see me right away.  He states understanding.  No follow-ups on file.

## 2021-07-09 NOTE — Telephone Encounter (Signed)
[facility] faxed refill request for the following medications:  allopurinol (ZYLOPRIM) 300 MG tablet   Please advise.

## 2021-07-09 NOTE — Telephone Encounter (Signed)
Please review the last time he had his uric acid level checked was 09/2020, wasn't sure if he needed to have lab drawn prior to refill since it has been over 6 months?KW

## 2021-07-16 ENCOUNTER — Telehealth: Payer: Self-pay | Admitting: Family Medicine

## 2021-07-16 ENCOUNTER — Other Ambulatory Visit: Payer: Self-pay | Admitting: Cardiovascular Disease

## 2021-07-16 ENCOUNTER — Other Ambulatory Visit: Payer: Self-pay | Admitting: Family Medicine

## 2021-07-16 DIAGNOSIS — I10 Essential (primary) hypertension: Secondary | ICD-10-CM

## 2021-07-16 DIAGNOSIS — I25118 Atherosclerotic heart disease of native coronary artery with other forms of angina pectoris: Secondary | ICD-10-CM

## 2021-07-16 DIAGNOSIS — E1151 Type 2 diabetes mellitus with diabetic peripheral angiopathy without gangrene: Secondary | ICD-10-CM

## 2021-07-16 MED ORDER — METFORMIN HCL ER 500 MG PO TB24: ORAL_TABLET | ORAL | 0 refills | Status: DC

## 2021-07-16 NOTE — Telephone Encounter (Signed)
[facility] faxed refill request for the following medications:  metFORMIN (GLUCOPHAGE-XR) 500 MG 24 hr tablet   Please advise.

## 2021-07-29 ENCOUNTER — Other Ambulatory Visit: Payer: Self-pay

## 2021-07-29 ENCOUNTER — Telehealth: Payer: Self-pay

## 2021-07-29 ENCOUNTER — Telehealth: Payer: Self-pay | Admitting: Family Medicine

## 2021-07-29 DIAGNOSIS — I25118 Atherosclerotic heart disease of native coronary artery with other forms of angina pectoris: Secondary | ICD-10-CM

## 2021-07-29 DIAGNOSIS — L409 Psoriasis, unspecified: Secondary | ICD-10-CM

## 2021-07-29 DIAGNOSIS — I10 Essential (primary) hypertension: Secondary | ICD-10-CM

## 2021-07-29 DIAGNOSIS — E1151 Type 2 diabetes mellitus with diabetic peripheral angiopathy without gangrene: Secondary | ICD-10-CM

## 2021-07-29 MED ORDER — EZETIMIBE 10 MG PO TABS: 10.0000 mg | ORAL_TABLET | Freq: Every day | ORAL | 0 refills | Status: DC

## 2021-07-29 MED ORDER — ISOSORBIDE MONONITRATE ER 60 MG PO TB24: 60.0000 mg | ORAL_TABLET | Freq: Every day | ORAL | 0 refills | Status: DC

## 2021-07-29 MED ORDER — ATORVASTATIN CALCIUM 40 MG PO TABS: 40.0000 mg | ORAL_TABLET | Freq: Every day | ORAL | 0 refills | Status: DC

## 2021-07-29 MED ORDER — AMLODIPINE BESYLATE 5 MG PO TABS: 5.0000 mg | ORAL_TABLET | Freq: Two times a day (BID) | ORAL | 0 refills | Status: DC

## 2021-07-29 MED ORDER — CLOBETASOL PROPIONATE 0.05 % EX SOLN: 1.0000 "application " | Freq: Two times a day (BID) | CUTANEOUS | 1 refills | Status: DC

## 2021-07-29 MED ORDER — POTASSIUM CHLORIDE ER 10 MEQ PO TBCR: 10.0000 meq | EXTENDED_RELEASE_TABLET | Freq: Two times a day (BID) | ORAL | 0 refills | Status: DC

## 2021-07-29 MED ORDER — TORSEMIDE 20 MG PO TABS: 40.0000 mg | ORAL_TABLET | Freq: Two times a day (BID) | ORAL | 0 refills | Status: DC

## 2021-07-29 MED ORDER — CARVEDILOL 25 MG PO TABS: 25.0000 mg | ORAL_TABLET | Freq: Two times a day (BID) | ORAL | 0 refills | Status: DC

## 2021-07-29 MED ORDER — METFORMIN HCL ER 500 MG PO TB24: ORAL_TABLET | ORAL | 0 refills | Status: DC

## 2021-07-29 NOTE — Telephone Encounter (Signed)
[facility] faxed refill request for the following medications:  clobetasol (TEMOVATE) 0.05 % external solution   ketoconazole (NIZORAL) 2 % shampoo   pantoprazole (PROTONIX) 40 MG tablet   pregabalin (LYRICA) 150 MG capsule    metFORMIN (GLUCOPHAGE-XR) 500 MG 24 hr tablet   losartan (COZAAR) 100 MG tablet   allopurinol (ZYLOPRIM) 300 MG tablet   meloxicam (MOBIC) 15 MG tablet   Please advise.

## 2021-07-29 NOTE — Telephone Encounter (Signed)
[facility] faxed refill request for the following medications:   clobetasol (TEMOVATE) 0.05 % external solution -LRF 07/01/21 100 ml x 1 @ [facility]   ketoconazole (NIZORAL) 2 % shampoo -07/01/21 120 [facility] x [facility]   pantoprazole (PROTONIX) 40 MG tablet -06/23/21 90 [facility]   pregabalin (LYRICA) 150 MG capsule  -07/03/21 180 x [facility]   metFORMIN (GLUCOPHAGE-XR) 500 MG 24 hr tablet -07/16/21 360 x 0 [facility]   losartan (COZAAR) 100 MG tablet -07/16/21 90 x 3 [facility]   allopurinol (ZYLOPRIM) 300 MG tablet -07/09/21 90 x 3 [facility]   meloxicam (MOBIC) 15 MG tablet -90 x 0 [facility]

## 2021-07-29 NOTE — Telephone Encounter (Signed)
cOMPLETED

## 2021-07-29 NOTE — Telephone Encounter (Addendum)
Patient returned call- patient states he was able to get [facility] to [facility]. The only 2 he needs forwarded are from [facility]: Clobetasol and Metformin. Please have those forwarded for him.

## 2021-08-04 ENCOUNTER — Telehealth: Payer: Self-pay | Admitting: Family Medicine

## 2021-08-04 ENCOUNTER — Other Ambulatory Visit: Payer: Self-pay

## 2021-08-04 DIAGNOSIS — I10 Essential (primary) hypertension: Secondary | ICD-10-CM

## 2021-08-04 DIAGNOSIS — M5137 Other intervertebral disc degeneration, lumbosacral region: Secondary | ICD-10-CM

## 2021-08-04 DIAGNOSIS — M5416 Radiculopathy, lumbar region: Secondary | ICD-10-CM

## 2021-08-04 DIAGNOSIS — I25118 Atherosclerotic heart disease of native coronary artery with other forms of angina pectoris: Secondary | ICD-10-CM

## 2021-08-04 DIAGNOSIS — M47817 Spondylosis without myelopathy or radiculopathy, lumbosacral region: Secondary | ICD-10-CM

## 2021-08-04 MED ORDER — ALLOPURINOL 300 MG PO TABS: 300.0000 mg | ORAL_TABLET | Freq: Every day | ORAL | 3 refills | Status: DC

## 2021-08-04 MED ORDER — PANTOPRAZOLE SODIUM 40 MG PO TBEC: 40.0000 mg | DELAYED_RELEASE_TABLET | Freq: Every day | ORAL | 3 refills | Status: DC

## 2021-08-04 MED ORDER — LOSARTAN POTASSIUM 100 MG PO TABS: ORAL_TABLET | ORAL | 0 refills | Status: DC

## 2021-08-04 MED ORDER — PREGABALIN 150 MG PO CAPS: ORAL_CAPSULE | ORAL | 3 refills | Status: DC

## 2021-08-04 MED ORDER — MELOXICAM 15 MG PO TABS: 15.0000 mg | ORAL_TABLET | Freq: Every day | ORAL | 0 refills | Status: DC

## 2021-08-04 NOTE — Telephone Encounter (Signed)
[facility] faxed refill request for the following medications:  losartan (COZAAR) 100 MG tablet   allopurinol (ZYLOPRIM) 300 MG tablet   pantoprazole (PROTONIX) 40 MG tablet   pregabalin (LYRICA) 150 MG capsule   meloxicam (MOBIC) 15 MG tablet   Please advise.

## 2021-08-08 ENCOUNTER — Telehealth: Payer: Self-pay | Admitting: Family Medicine

## 2021-08-08 DIAGNOSIS — L409 Psoriasis, unspecified: Secondary | ICD-10-CM

## 2021-08-08 MED ORDER — KETOCONAZOLE 2 % EX SHAM: 1.0000 "application " | MEDICATED_SHAMPOO | CUTANEOUS | 1 refills | Status: DC

## 2021-08-08 NOTE — Telephone Encounter (Signed)
[facility] faxed refill request for the following medications:  ketoconazole (NIZORAL) 2 % shampoo   Please advise

## 2021-08-28 ENCOUNTER — Other Ambulatory Visit: Payer: Self-pay

## 2021-08-28 ENCOUNTER — Encounter: Payer: Self-pay | Admitting: Podiatry

## 2021-08-28 ENCOUNTER — Ambulatory Visit (INDEPENDENT_AMBULATORY_CARE_PROVIDER_SITE_OTHER): Admitting: Podiatry

## 2021-08-28 DIAGNOSIS — R2689 Other abnormalities of gait and mobility: Secondary | ICD-10-CM | POA: Diagnosis not present

## 2021-08-28 DIAGNOSIS — M21372 Foot drop, left foot: Secondary | ICD-10-CM

## 2021-08-28 NOTE — Progress Notes (Signed)
?Subjective:  ?Patient ID: Jonathon Newman, male    DOB: 08/14/1955,  MRN: [id number] ? ?Chief Complaint  ?Patient presents with  ? Neuroma  ?  "They always feel cold"  ? ? ?66 y.o. male presents with the above complaint.  Patient presents with a new complaint of left dropfoot.  Patient states he used to wear a brace however that brace is no longer functioning right and is becoming too loose.  He wanted to get evaluated for new brace.  He states that he have caught his foot multiple times and felt fall because he is unable to clear the ground.  He states that this has been going on for a long time is progressive gotten worse.  The brace is at least 74 to 65 years old.  He has not seen anyone as prior to seeing me for this.  He denies any other acute complaints. ? ? ?Review of Systems: Negative except as noted in the HPI. Denies N/V/F/Ch. ? ?Past Medical History:  ?Diagnosis Date  ? Anginal pain (HCC)   ? Arthritis   ? BPH (benign prostatic hyperplasia)   ? CHF (congestive heart failure) (HCC)   ? Coronary artery disease 2010  ? a.) LHC 2010: high grade RCA stenosis -> 2.5 x 74mm Cypher DES to RCA. b.) NSTEMI 2016 -> PCI revealed pat RCA stent; sig dLM Dz with FFR ratio 0.76 and oLCx stenosis; ref to CVTS. c.) 2v CABG 04/21/2015; LIMA-LAD, SVG-OM2. d.) [facility] 05/02/2018: EF 55-65%; LVEDP 14-15 mmHg; 10% p-m RCA, 30% pRCA, 20% dRCA, 95% o-pLCx, 80% m-dLM; LIMA graft pat. SVG graft with mild diffuse Dz.  ? DDD (degenerative disc disease), lumbar   ? Dyspnea   ? GERD (gastroesophageal reflux disease)   ? Hypercholesteremia   ? Hypertension   ? Left foot drop   ? NSTEMI (non-ST elevated myocardial infarction) (HCC) 2016  ? a.) PCI revealed patent RCA stent; significant dLM disease with FFR ratio 0.76 and oLCx stenosis; refer to CVTS for CABG.  ? OSA on CPAP   ? Postlaminectomy syndrome   ? 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 a.) 2v CABG; LIMA-LAD, SVG-OM2  ? T2DM (type 2 diabetes mellitus) (HCC)   ? ? ?Current Outpatient  Medications:  ?  allopurinol (ZYLOPRIM) 300 MG tablet, Take 1 tablet (300 mg total) by mouth daily., Disp: 90 tablet, Rfl: 3 ?  amLODipine (NORVASC) 5 MG tablet, Take 1 tablet (5 mg total) by mouth 2 (two) times daily., Disp: 180 tablet, Rfl: 0 ?  atorvastatin (LIPITOR) 40 MG tablet, Take 1 tablet (40 mg total) by mouth daily., Disp: 90 tablet, Rfl: 0 ?  Blood Glucose Monitoring Suppl (FREESTYLE LITE) DEVI, To check blood sugar once daily, Disp: 1 each, Rfl: 0 ?  carvedilol (COREG) 25 MG tablet, Take 1 tablet (25 mg total) by mouth 2 (two) times daily with a meal., Disp: 180 tablet, Rfl: 0 ?  clobetasol (TEMOVATE) 0.05 % external solution, Apply 1 application topically 2 (two) times daily., Disp: 100 mL, Rfl: 1 ?  cyclobenzaprine (FLEXERIL) 10 MG tablet, TAKE ONE TABLET BY MOUTH THREE TIMES A DAY AS NEEDED FOR MUSCLE SPASMS, Disp: 90 tablet, Rfl: 1 ?  ezetimibe (ZETIA) 10 MG tablet, Take 1 tablet (10 mg total) by mouth daily., Disp: 90 tablet, Rfl: 0 ?  glucose blood (FREESTYLE LITE) test strip, USE TO CHECK BLOOD SUGAR ONCE DAILY, Disp: 100 strip, Rfl: 4 ?  isosorbide mononitrate (IMDUR) 60 MG 24 hr tablet, Take 1 tablet (  60 mg total) by mouth daily., Disp: 90 tablet, Rfl: 0 ?  ketoconazole (NIZORAL) 2 % shampoo, Apply 1 application topically 2 (two) times a week., Disp: 120 mL, Rfl: 1 ?  Lancets (FREESTYLE) lancets, USE TO CHECK BLOOD SUGAR ONCE DAILY, Disp: 100 each, Rfl: 4 ?  losartan (COZAAR) 100 MG tablet, TAKE ONE TABLET BY MOUTH DAILY, Disp: 90 tablet, Rfl: 0 ?  meloxicam (MOBIC) 15 MG tablet, Take 1 tablet (15 mg total) by mouth daily., Disp: 90 tablet, Rfl: 0 ?  metFORMIN (GLUCOPHAGE-XR) 500 MG 24 hr tablet, TAKE TWO TABLETS BY MOUTH TWICE A DAY WITH A MEAL, Disp: 360 tablet, Rfl: 0 ?  Multiple Vitamin (MULTIVITAMIN WITH MINERALS) TABS tablet, Take 1 tablet by mouth daily., Disp: , Rfl:  ?  naproxen sodium (ALEVE) 220 MG tablet, Take 440 mg by mouth daily as needed (pain)., Disp: , Rfl:  ?  nitroGLYCERIN  (NITROSTAT) 0.4 MG SL tablet, Place 1 tablet (0.4 mg total) under the tongue every 5 (five) minutes as needed for chest pain., Disp: 25 tablet, Rfl: 3 ?  pantoprazole (PROTONIX) 40 MG tablet, Take 1 tablet (40 mg total) by mouth daily., Disp: 90 tablet, Rfl: 3 ?  potassium chloride (KLOR-CON) 10 MEQ tablet, Take 1 tablet (10 mEq total) by mouth 2 (two) times daily., Disp: 180 tablet, Rfl: 0 ?  pregabalin (LYRICA) 150 MG capsule, TAKE 1 CAPSULE BY MOUTH TWO TIMES A DAY, Disp: 180 capsule, Rfl: 3 ?  senna (SENOKOT) 8.6 MG TABS tablet, Take 1 tablet by mouth daily., Disp: , Rfl:  ?  torsemide (DEMADEX) 20 MG tablet, Take 2 tablets (40 mg total) by mouth 2 (two) times daily., Disp: 360 tablet, Rfl: 0 ?  TRULICITY 1.5 MG/0.5ML SOPN, INJECT 1.5 MG UNDER THE SKIN ONCE A WEEK, Disp: 6 mL, Rfl: 3 ? ?Social History  ? ?Tobacco Use  ?Smoking Status Former  ? Packs/day: 0.25  ? Years: 2.00  ? Pack years: 0.50  ? Types: Cigarettes  ? Quit date: 05/26/2005  ? Years since quitting: 16.2  ?Smokeless Tobacco Former  ? Types: Chew  ? ? ?Allergies  ?Allergen Reactions  ? Colchicine Other (See Comments)  ?  Palpitations and headache  ? Lisinopril Cough  ? ?Objective:  ?There were no vitals filed for this visit. ?There is no height or weight on file to calculate BMI. ?Constitutional Well developed. ?Well nourished.  ?Vascular Dorsalis pedis pulses palpable bilaterally. ?Posterior tibial pulses palpable bilaterally. ?Capillary refill normal to all digits.  ?No cyanosis or clubbing noted. ?Pedal hair growth normal.  ?Neurologic Normal speech. ?Oriented to person, place, and time. ?Epicritic sensation to light touch grossly present bilaterally.  ?Dermatologic Nails well groomed and normal in appearance. ?No open wounds. ?No skin lesions.  ?Orthopedic: Gait examination shows dropfoot to the left side unable to clear the ground during the lift off phase during the gait cycle.  This is primarily to the left side.  The right side is  functioning well and without gait abnormality  ? ?Radiographs: None ?Assessment:  ? ?1. Left foot drop   ?2. Antalgic gait   ? ?Plan:  ?Patient was evaluated and treated and all questions answered. ? ?Left dropfoot with antalgic gait and history of falls ?-All questions and concerns were discussed with the patient in extensive detail ?-I discussed with the patient that he will benefit from an AFO brace to help clear the ground.  I discussed with him he will be scheduled to see Arlys JohnBrian for  foot and ankle arthrosis for dropfoot. ?-I will see him back again in 8 weeks to assess him with the brace. ? ?Return for schedule with Arlys John for bracing left foot.  ?

## 2021-09-05 ENCOUNTER — Ambulatory Visit (INDEPENDENT_AMBULATORY_CARE_PROVIDER_SITE_OTHER)

## 2021-09-05 ENCOUNTER — Other Ambulatory Visit: Payer: Self-pay

## 2021-09-05 ENCOUNTER — Other Ambulatory Visit: Payer: Self-pay | Admitting: Family Medicine

## 2021-09-05 DIAGNOSIS — M21372 Foot drop, left foot: Secondary | ICD-10-CM

## 2021-09-05 DIAGNOSIS — M5137 Other intervertebral disc degeneration, lumbosacral region: Secondary | ICD-10-CM

## 2021-09-05 DIAGNOSIS — M47817 Spondylosis without myelopathy or radiculopathy, lumbosacral region: Secondary | ICD-10-CM

## 2021-09-05 DIAGNOSIS — R2689 Other abnormalities of gait and mobility: Secondary | ICD-10-CM

## 2021-09-05 DIAGNOSIS — M5416 Radiculopathy, lumbar region: Secondary | ICD-10-CM

## 2021-09-05 NOTE — Progress Notes (Signed)
SITUATION ?Patient Name:  Jonathon Newman ?MRN:   [id number] ?Reason for Visit: Evaluation for Speciality AFO ? ?Patient Report: ?Chief Complaint:   Foot Drop ?Nature of Discomfort/Pain:  Ambulatory ?Location:    left lower extremity ?Onset & Duration:   Sudden and Present longer than 3 months ?Course:    unchanged ?Aggravating or Alleviating Factors: Ambulation ? ?OBJECTIVE DATA & MEASUREMENTS ?Prognosis:    Good ?Duration of use:   5 years ? ?Diagnosis: ?  ICD-10-CM   ?1. Left foot drop  M21.372   ?  ?2. Antalgic gait  R26.89   ?  ? ? ?Patient Weight:   ?Shoe Size:   9.5XW ? ?GOALS, NECESSITIES, & JUSTIFICATIONS ?Recommended Device: Rene Paci Reaction ?Size:    Large ?Side:    left ? ?Laterality HCPCS Code Description Justification  ?left [id number] Ankle foot orthosis (AFO), pretibial shell, plastic or other material, prefabricated, includes fitting and adjustment Necessary to prevent foot drop during ambulation in order to prevent tripping and injury.  ? ? ?I certify that [name] qualifies for and will benefit from an ankle foot orthosis used during ambulation based on meeting all of the following criteria;  ? ?The patient is: ?- Ambulatory, and ?- Has weakness or deformity of the foot and ankle, and ?- Requires stabilization for medical reasons, and ?- Has the potential to benefit functionally ? ?The patient?s medical record contains sufficient documentation of the patients medical condition to substantiate the necessity for the type and quantity of the items ordered. ? ?The goals of this therapy: ?- Improve Mobility ?- Improve Lower Extremity Stability ?- Decrease Pain ? ?I hereby certify that the ankle foot orthotic described above is a rigid or semi-rigid device which is used for the purpose of supporting a weak or deformed body member or restricting or eliminating motion in a diseased or injured part of the body. It is designed to provide support and counterforce on the limb or body part that is  being braced. In my opinion, the ankle foot orthosis reasonable and necessary in reference to accepted standards of medical practice in the treatment  ?of the patient condition and rehabilitation. ? ?ACTIONS PERFORMED ?Patient was evaluated and measured for Prefabricated Carbon AFO with tibial shell. Procedure was explained to patient. Patient tolerated procedure. patient selected device color and closure method.  ? ?PLAN ?Patient to return in four to six weeks for fitting and delivery of device. Plan of care was explained to and agreed upon by patient. All questions were answered and concerns addressed. ? ? ? ? ? ?

## 2021-10-15 ENCOUNTER — Other Ambulatory Visit: Payer: Self-pay | Admitting: Cardiovascular Disease

## 2021-10-24 ENCOUNTER — Other Ambulatory Visit: Payer: Self-pay | Admitting: Family Medicine

## 2021-10-24 ENCOUNTER — Telehealth: Payer: Self-pay

## 2021-10-24 DIAGNOSIS — M5416 Radiculopathy, lumbar region: Secondary | ICD-10-CM

## 2021-10-24 DIAGNOSIS — M51379 Other intervertebral disc degeneration, lumbosacral region without mention of lumbar back pain or lower extremity pain: Secondary | ICD-10-CM

## 2021-10-24 DIAGNOSIS — M5137 Other intervertebral disc degeneration, lumbosacral region: Secondary | ICD-10-CM

## 2021-10-24 NOTE — Telephone Encounter (Signed)
AFO ordered

## 2021-10-27 NOTE — Telephone Encounter (Signed)
Requested medication (s) are due for refill today: yes ? ?Requested medication (s) are on the active medication list: yes ? ?Last refill:  07/01/21 #90 1  RF ? ?Future visit scheduled: no ? ?Notes to clinic:  med not delegated to NT to RF ? ? ?Requested Prescriptions  ?Pending Prescriptions Disp Refills  ? cyclobenzaprine (FLEXERIL) 10 MG tablet [Pharmacy Med Name: CYCLOBENZAPRINE HCL TABS 10MG ] 90 tablet 11  ?  Sig: TAKE 1 TABLET THREE TIMES A DAY AS NEEDED FOR MUSCLE SPASMS  ?  ? Not Delegated - Analgesics:  Muscle Relaxants Failed - 10/24/2021 10:03 AM  ?  ?  Failed - This refill cannot be delegated  ?  ?  Passed - Valid encounter within last 6 months  ?  Recent Outpatient Visits   ? ?      ? 3 months ago Psoriasis of scalp  ? [facility], FNP  ? 7 months ago History of recent fall  ? [facility], FNP  ? 10 months ago Type 2 diabetes mellitus with diabetic peripheral angiopathy without gangrene, without long-term current use of insulin (HCC)  ? [facility] [place], [place], MD  ? 10 months ago Type 2 diabetes mellitus with hyperosmolarity without coma, without long-term current use of insulin (HCC)  ? [facility] [place], [facility], Jodell Cipro  ? 11 months ago Lumbar radiculopathy  ? [facility] [place], [facility], PA-C  ? ?  ?  ?Future Appointments   ? ?        ? In 1 week [place], Jodell Cipro, MD [facility], [facility]  ? ?  ? ? ?  ?  ?  ? ? ? ? ?

## 2021-11-03 NOTE — Progress Notes (Unsigned)
Cardiology Office Note  Date:  11/05/2021   ID:  Jonathon Newman, DOB 1956-01-12, MRN [id number]  PCP:  Gwyneth Sprout, FNP   Chief Complaint  Patient presents with   6 month follow up     "Doing well." Medications reviewed by the patient verbally.     HPI:  [name] is a 66 year old gentleman with past medical history of coronary artery disease  chronic diastolic heart failure.   Cypher drug-eluting stent placement to the RCA in 2010.   two-vessel coronary artery bypass graft in 2016 at [facility].   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 [place]  Back surgery, foot drop, recent falls, followed by orthopedics Spinal cord stimulator December 2022 Took out temp stimulator, 05/26/21 scheduled for permanent stimulator  Taking extra torsemide 20 milligrams at lunch sometimes for leg swelling Drinks lots of water Continues torsemide 40 BID Some stuttered breathing  GERD on PPI  Lab work reviewed Acute renal failure 02/14/21,  above baseline Hemoglobin A1c 6.4 in September 2022 Total cholesterol 146 LDL 22 triglycerides elevated 775 Creatinine improved 1.28, BUN 27  EKG personally reviewed by myself on todays visit Nsr rate 68 bpm, no ST or T wave changes  Other past medical hx reviewed Chronic SOB, rare chest and jaw pain, Chronic dizziness, dating back several years  stress test was ordered Oct 18, 2019  low risk study no significant ischemia ejection fraction 52%  cardiac  catheterization November 2019  medical management was recommended  having symptoms of chest discomfort at the time  diagnostic R/LHC on 05/02/2018 that showed significant underlying left main disease with patent grafts including LIMA to LAD and SVG to OM2. The RCA stent was patent with minimal restenosis. He had normal LVSF with a mildly elevated LVEDP at 14-15 mmHg.   He is wearing his C-PAP.  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  PSH:    Past Surgical History:  Procedure Laterality Date   APPENDECTOMY     BACK SURGERY  11/01/2017   SL 5 and S1   CARDIAC CATHETERIZATION     CARPAL TUNNEL RELEASE     left hand   CHOLECYSTECTOMY     CHOLECYSTECTOMY     CORONARY ARTERY BYPASS GRAFT  04/21/2015   CABG x 2 [place] [facility] LIMA to LAD amd SVG to OM2   CORONARY STENT PLACEMENT  2010   Cordis Cypher Sirolimus-eluting stent 2.50 mm x 26 mm placed to the RCA at [place] N/A 08/04/2017   Procedure: ESOPHAGEAL MANOMETRY (EM);  Surgeon: Lin Landsman, MD;  Location: [facility] END[facility];  Service: Endoscopy;  Laterality: N/A;   FRACTURE SURGERY  KNEE SURGERY     KNEE SURGERY     right knee    LAMINECTOMY     "plate in neck [id number]"   LEFT HEART CATH AND CORS/GRAFTS ANGIOGRAPHY N/A 05/02/2018   Procedure: CORS/GRAFTS ANGIOGRAPHY;  Surgeon: Wellington Hampshire, MD;  Location: [place] [facility];  Service: Cardiovascular;  Laterality: N/A;   RIGHT/LEFT HEART CATH AND CORONARY ANGIOGRAPHY Bilateral 05/02/2018    Procedure: LEFT HEART CATH;  Surgeon: Wellington Hampshire, MD;  Location: [place] CV LAB;  Service: Cardiovascular;  Laterality: Bilateral;   THORACIC LAMINECTOMY FOR SPINAL CORD STIMULATOR N/A 05/26/2021   Procedure: THORACIC SPINAL CORD STIMULATOR AND PULSE GENERATOR PLACEMENT ([employer]TRON[employer]);  Surgeon: Deetta Perla, MD;  Location: [facility] ORS;  Service: Neurosurgery;  Laterality: N/A;   VASECTOMY      Current Outpatient Medications  Medication Sig Dispense Refill   allopurinol (ZYLOPRIM) 300 MG tablet Take 1 tablet (300 mg total) by mouth daily. 90 tablet 3   amLODipine (NORVASC) 5 MG tablet Take 1 tablet (5 mg total) by mouth 2 (two) times daily. 180 tablet 0   atorvastatin (LIPITOR) 40 MG tablet Take 1 tablet (40 mg total) by mouth daily. 90 tablet 0   Blood Glucose Monitoring Suppl (FREESTYLE LITE) DEVI To check blood sugar once daily 1 each 0   carvedilol (COREG) 25 MG tablet Take 1 tablet (25 mg total) by mouth 2 (two) times daily with a meal. 180 tablet 0   clobetasol (TEMOVATE) 0.05 % external solution Apply 1 application topically 2 (two) times daily. 100 mL 1   cyclobenzaprine (FLEXERIL) 10 MG tablet TAKE 1 TABLET THREE TIMES A DAY AS NEEDED FOR MUSCLE SPASMS 90 tablet 11   ezetimibe (ZETIA) 10 MG tablet TAKE ONE TABLET BY MOUTH DAILY 90 tablet 0   glucose blood (FREESTYLE LITE) test strip USE TO CHECK BLOOD SUGAR ONCE DAILY 100 strip 4   isosorbide mononitrate (IMDUR) 60 MG 24 hr tablet Take 1 tablet (60 mg total) by mouth daily. 90 tablet 0   ketoconazole (NIZORAL) 2 % shampoo Apply 1 application topically 2 (two) times a week. 120 mL 1   Lancets (FREESTYLE) lancets USE TO CHECK BLOOD SUGAR ONCE DAILY 100 each 4   losartan (COZAAR) 100 MG tablet TAKE ONE TABLET BY MOUTH DAILY 90 tablet 0   meloxicam (MOBIC) 15 MG tablet Take 1 tablet (15 mg total) by mouth daily. 90 tablet 0   metFORMIN (GLUCOPHAGE-XR) 500 MG 24 hr tablet TAKE TWO TABLETS BY MOUTH TWICE A DAY WITH A MEAL 360 tablet 0    Multiple Vitamin (MULTIVITAMIN WITH MINERALS) TABS tablet Take 1 tablet by mouth daily.     naproxen sodium (ALEVE) 220 MG tablet Take 440 mg by mouth daily as needed (pain).     nitroGLYCERIN (NITROSTAT) 0.4 MG SL tablet Place 1 tablet (0.4 mg total) under the tongue every 5 (five) minutes as needed for chest pain. 25 tablet 3   pantoprazole (PROTONIX) 40 MG tablet Take 1 tablet (40 mg total) by mouth daily. 90 tablet 3   potassium chloride (KLOR-CON) 10 MEQ tablet Take 1 tablet (10 mEq total) by mouth 2 (two) times daily. 180 tablet 0   pregabalin (LYRICA) 150 MG capsule TAKE 1 CAPSULE BY MOUTH TWO TIMES A DAY 180 capsule 3   senna (SENOKOT) 8.6 MG TABS tablet Take 1 tablet by mouth daily.     torsemide (DEMADEX) 20 MG tablet Take 2 tablets (40 mg total) by mouth 2 (two) times  daily. [id number] tablet 0   TRULICITY 1.5 0000000 SOPN INJECT 1.5 MG UNDER THE SKIN ONCE A WEEK 6 mL 3   No current facility-administered medications for this visit.    Allergies:   Colchicine and Lisinopril   Social History:  The patient  reports that he quit smoking about 16 years ago. His smoking use included cigarettes. He has a 0.50 pack-year smoking history. He has quit using smokeless tobacco.  His smokeless tobacco use included chew. He reports that he does not drink alcohol and does not use drugs.   Family History:   family history includes Arthritis in his mother; Cancer in his maternal grandfather; Cataracts in his maternal grandmother; Glaucoma in his maternal grandmother; Heart attack in his paternal grandfather; Heart attack (age of onset: 66) in his father; Heart murmur in his brother; Hyperlipidemia in his father; Hypertension in his father and paternal grandfather; Valvular heart disease in his brother.    Review of Systems: Review of Systems  Constitutional: Negative.   HENT: Negative.    Respiratory: Negative.    Cardiovascular: Negative.   Gastrointestinal:  Positive for heartburn.   Musculoskeletal: Negative.   Neurological:  Positive for dizziness.  Psychiatric/Behavioral: Negative.    All other systems reviewed and are negative.   PHYSICAL EXAM: VS:  BP 110/60 (BP Location: Left Arm, Patient Position: Sitting, Cuff Size: Large)   Pulse 68   Ht 5\' 7"  (1.702 m)   Wt 240 lb 8 oz (109.1 kg)   SpO2 98%   BMI 37.67 kg/m  , BMI Body mass index is 37.67 kg/m. Constitutional:  oriented to person, place, and time. No distress.  HENT:  Head: Grossly normal Eyes:  no discharge. No scleral icterus.  Neck: No JVD, no carotid bruits  Cardiovascular: Regular rate and rhythm, no murmurs appreciated Pulmonary/Chest: Clear to auscultation bilaterally, no wheezes or rails Abdominal: Soft.  no distension.  no tenderness.  Musculoskeletal: Normal range of motion Neurological:  normal muscle tone. Coordination normal. No atrophy Skin: Skin warm and dry Psychiatric: normal affect, pleasant Do not forget to Recent Labs: 02/10/2021: ALT 299 05/12/2021: BUN 27; Creatinine, Ser 1.28; Hemoglobin 12.7; Platelets 176; Potassium 3.9; Sodium 138    Lipid Panel Lab Results  Component Value Date   CHOL 146 12/27/2020   HDL 25 (L) 12/27/2020   LDLCALC 22 12/27/2020   TRIG 775 (HH) 12/27/2020      Wt Readings from Last 3 Encounters:  11/05/21 240 lb 8 oz (109.1 kg)  07/01/21 233 lb 4.8 oz (105.8 kg)  05/26/21 226 lb 12.8 oz (102.9 kg)     ASSESSMENT AND PLAN:  Acute on chronic diastolic congestive heart failure (HCC) -  On torsemide 40 BID, sometimes taking extra torsemide 20 at lunch for leg swelling High fluid intake CR was elevated September 2022 with improvement November 2022 at 1.28, appears euvolemic Recheck BMP given he is taking extra torsemide Occasional shortness of breath at sitting position Echocardiogram pending  Dizziness Unable to exclude prerenal state Lab work ordered Recommend he monitor blood pressure at home  GERD Continue on PPI  Coronary  artery disease involving native coronary artery of native heart with angina pectoris (HCC) Chronic angina, denies unstable angina symptoms Has had prior catheterization 2019  S/P coronary artery bypass graft x 2  Stress test as above, low risk Prior cardiac catheterization No unstable angina  Hyperlipidemia, unspecified hyperlipidemia type Continue Lipitor 40 daily,  goal LDL less than 70 No changes  Essential hypertension Blood  pressure is well controlled on today's visit. No changes made to the medications. We will need to monitor hypovolemia given lower blood pressure and some dizziness.  Lab work pending  Chronic dizziness Prior ENT work-up in the past  Prior MRI head 2017 no acute findings  Diabetes type 2 A1C improved into the low 6 range Since then weight has been trending upwards since retirement We will encouraged careful diet management in an effort to lose weight.    Total encounter time more than 30 minutes  Greater than 50% was spent in counseling and coordination of care with the patient     No orders of the defined types were placed in this encounter.    Signed, Esmond Plants, M.D., Ph.D. 11/05/2021  [place], [place]

## 2021-11-05 ENCOUNTER — Encounter: Payer: Self-pay | Admitting: Cardiovascular Disease

## 2021-11-05 ENCOUNTER — Ambulatory Visit (INDEPENDENT_AMBULATORY_CARE_PROVIDER_SITE_OTHER): Payer: Medicare PPO | Admitting: Cardiovascular Disease

## 2021-11-05 VITALS — BP 110/60 | HR 68 | Ht 67.0 in | Wt 240.5 lb

## 2021-11-05 DIAGNOSIS — I5032 Chronic diastolic (congestive) heart failure: Secondary | ICD-10-CM | POA: Diagnosis not present

## 2021-11-05 DIAGNOSIS — E785 Hyperlipidemia, unspecified: Secondary | ICD-10-CM | POA: Diagnosis not present

## 2021-11-05 DIAGNOSIS — I10 Essential (primary) hypertension: Secondary | ICD-10-CM

## 2021-11-05 DIAGNOSIS — E1151 Type 2 diabetes mellitus with diabetic peripheral angiopathy without gangrene: Secondary | ICD-10-CM | POA: Diagnosis not present

## 2021-11-05 DIAGNOSIS — R42 Dizziness and giddiness: Secondary | ICD-10-CM

## 2021-11-05 DIAGNOSIS — I25118 Atherosclerotic heart disease of native coronary artery with other forms of angina pectoris: Secondary | ICD-10-CM | POA: Diagnosis not present

## 2021-11-05 MED ORDER — CARVEDILOL 25 MG PO TABS: 25.0000 mg | ORAL_TABLET | Freq: Two times a day (BID) | ORAL | 3 refills | Status: DC

## 2021-11-05 MED ORDER — TORSEMIDE 20 MG PO TABS: 40.0000 mg | ORAL_TABLET | Freq: Two times a day (BID) | ORAL | 3 refills | Status: DC

## 2021-11-05 MED ORDER — ATORVASTATIN CALCIUM 40 MG PO TABS: 40.0000 mg | ORAL_TABLET | Freq: Every day | ORAL | 3 refills | Status: DC

## 2021-11-05 MED ORDER — ISOSORBIDE MONONITRATE ER 60 MG PO TB24: 60.0000 mg | ORAL_TABLET | Freq: Every day | ORAL | 3 refills | Status: DC

## 2021-11-05 MED ORDER — LOSARTAN POTASSIUM 100 MG PO TABS: ORAL_TABLET | ORAL | 3 refills | Status: DC

## 2021-11-05 MED ORDER — EZETIMIBE 10 MG PO TABS: 10.0000 mg | ORAL_TABLET | Freq: Every day | ORAL | 3 refills | Status: DC

## 2021-11-05 MED ORDER — AMLODIPINE BESYLATE 5 MG PO TABS: 5.0000 mg | ORAL_TABLET | Freq: Two times a day (BID) | ORAL | 3 refills | Status: DC

## 2021-11-05 NOTE — Patient Instructions (Addendum)
Medication Instructions:  No changes  If you need a refill on your cardiac medications before your next appointment, please call your pharmacy.   Lab work:   CMP , LIPIDs, A1C at hospital   [facility] Entrance at [facility] 1st desk on the right to check in (REGISTRATION)  Lab hours: Monday- Friday (7:30 am- 5:30 pm)   Testing/Procedures:  Your physician has requested that you have an echocardiogram. Echocardiography is a painless test that uses sound waves to create images of your heart. It provides your doctor with information about the size and shape of your heart and how well your heart's chambers and valves are working. This procedure takes approximately one hour. There are no restrictions for this procedure.   Follow-Up: At [facility], you and your health needs are our priority.  As part of our continuing mission to provide you with exceptional heart care, we have created designated Provider Care Teams.  These Care Teams include your primary Cardiologist (physician) and Advanced Practice Providers (APPs -  Physician Assistants and Nurse Practitioners) who all work together to provide you with the care you need, when you need it.  You will need a follow up appointment in 12 months  Providers on your designated Care Team:   Nicolasa Ducking, NP Eula Listen, PA-C Cadence Fransico [place], [place]  COVID-19 Vaccine Information can be found at: [email or web address] For questions related to vaccine distribution or appointments, please email [email or web address] or call [phone].

## 2021-11-06 ENCOUNTER — Other Ambulatory Visit
Admission: RE | Admit: 2021-11-06 | Discharge: 2021-11-06 | Disposition: A | Discharge status: Home / Self Care | Payer: Medicare PPO | Attending: Cardiovascular Disease | Admitting: Cardiovascular Disease

## 2021-11-06 DIAGNOSIS — E785 Hyperlipidemia, unspecified: Secondary | ICD-10-CM | POA: Diagnosis present

## 2021-11-06 DIAGNOSIS — I5032 Chronic diastolic (congestive) heart failure: Secondary | ICD-10-CM | POA: Insufficient documentation

## 2021-11-06 DIAGNOSIS — E1151 Type 2 diabetes mellitus with diabetic peripheral angiopathy without gangrene: Secondary | ICD-10-CM | POA: Insufficient documentation

## 2021-11-06 LAB — COMPREHENSIVE METABOLIC PANEL
ALT: 50 U/L — ABNORMAL HIGH (ref 0–44)
AST: 44 U/L — ABNORMAL HIGH (ref 15–41)
Albumin: 4.3 g/dL (ref 3.5–5.0)
Alkaline Phosphatase: 83 U/L (ref 38–126)
Anion gap: 11 (ref 5–15)
BUN: 26 mg/dL — ABNORMAL HIGH (ref 8–23)
CO2: 23 mmol/L (ref 22–32)
Calcium: 9.7 mg/dL (ref 8.9–10.3)
Chloride: 104 mmol/L (ref 98–111)
Creatinine, Ser: 1.44 mg/dL — ABNORMAL HIGH (ref 0.61–1.24)
GFR, Estimated: 54 mL/min — ABNORMAL LOW (ref >60)
Glucose, Bld: 134 mg/dL — ABNORMAL HIGH (ref 70–99)
Potassium: 4.1 mmol/L (ref 3.5–5.1)
Sodium: 138 mmol/L (ref 135–145)
Total Bilirubin: 0.6 mg/dL (ref 0.3–1.2)
Total Protein: 7.5 g/dL (ref 6.5–8.1)

## 2021-11-06 LAB — LDL CHOLESTEROL, DIRECT: Direct LDL: 34.8 mg/dL (ref 0–99)

## 2021-11-06 LAB — LIPID PANEL
Cholesterol: 106 mg/dL (ref 0–200)
HDL: 27 mg/dL — ABNORMAL LOW (ref >40)
LDL Cholesterol: UNDETERMINED mg/dL (ref 0–99)
Total CHOL/HDL Ratio: 3.9 RATIO
Triglycerides: 441 mg/dL — ABNORMAL HIGH (ref <150)
VLDL: UNDETERMINED mg/dL (ref 0–40)

## 2021-11-06 LAB — HEMOGLOBIN A1C
Hgb A1c MFr Bld: 6 % — ABNORMAL HIGH (ref 4.8–5.6)
Mean Plasma Glucose: 125.5 mg/dL

## 2021-12-04 ENCOUNTER — Other Ambulatory Visit: Payer: Self-pay | Admitting: Family Medicine

## 2021-12-04 DIAGNOSIS — E1151 Type 2 diabetes mellitus with diabetic peripheral angiopathy without gangrene: Secondary | ICD-10-CM

## 2021-12-11 ENCOUNTER — Ambulatory Visit (INDEPENDENT_AMBULATORY_CARE_PROVIDER_SITE_OTHER): Payer: Medicare PPO

## 2021-12-11 DIAGNOSIS — I5032 Chronic diastolic (congestive) heart failure: Secondary | ICD-10-CM

## 2021-12-11 LAB — ECHOCARDIOGRAM COMPLETE
AR max vel: 1.54 cm2
AV Area VTI: 1.42 cm2
AV Area mean vel: 1.52 cm2
AV Mean grad: 5 mmHg
AV Peak grad: 8.4 mmHg
Ao pk vel: 1.45 m/s
Area-P 1/2: 3.87 cm2
S' Lateral: 3.6 cm
Single Plane A4C EF: 55.4 %

## 2021-12-12 NOTE — Progress Notes (Signed)
Established patient visit   Patient: Jonathon Newman   DOB: 16-Aug-1955   66 y.o. Male  MRN: [id number] Visit Date: 12/19/2021  Today's healthcare provider: Jacky Kindle, FNP  I, Jacky Kindle, FNP, have reviewed all documentation for this visit. The documentation on 12/19/21 for the exam, diagnosis, procedures, and orders are all accurate and complete.   I,Tiffany J Bragg,acting as a scribe for Jacky Kindle, FNP.,have documented all relevant documentation on the behalf of Jacky Kindle, FNP,as directed by  Jacky Kindle, FNP while in the presence of Jacky Kindle, FNP.   Chief Complaint  Patient presents with   Leg Swelling    Patient complains of bilateral leg swelling for about 4 months. States he fell a month ago and bruised R ankle.    Hip Pain    States his L hip has been painful for 5 months. Walking aggravates it, but it is constantly painful.    Subjective    HPI HPI     Leg Swelling    Additional comments: Patient complains of bilateral leg swelling for about 4 months. States he fell a month ago and bruised R ankle.         Hip Pain    Additional comments: States his L hip has been painful for 5 months. Walking aggravates it, but it is constantly painful.       Last edited by Marlana Salvage, CMA on 12/19/2021  8:12 AM.       Medications: Outpatient Medications Prior to Visit  Medication Sig   allopurinol (ZYLOPRIM) 300 MG tablet Take 1 tablet (300 mg total) by mouth daily.   amLODipine (NORVASC) 5 MG tablet Take 1 tablet (5 mg total) by mouth 2 (two) times daily.   atorvastatin (LIPITOR) 40 MG tablet Take 1 tablet (40 mg total) by mouth daily.   Blood Glucose Monitoring Suppl (FREESTYLE LITE) DEVI To check blood sugar once daily   carvedilol (COREG) 25 MG tablet Take 1 tablet (25 mg total) by mouth 2 (two) times daily with a meal.   clobetasol (TEMOVATE) 0.05 % external solution Apply 1 application topically 2 (two) times daily.   cyclobenzaprine  (FLEXERIL) 10 MG tablet TAKE 1 TABLET THREE TIMES A DAY AS NEEDED FOR MUSCLE SPASMS   ezetimibe (ZETIA) 10 MG tablet Take 1 tablet (10 mg total) by mouth daily.   glucose blood (FREESTYLE LITE) test strip USE TO CHECK BLOOD SUGAR ONCE DAILY   isosorbide mononitrate (IMDUR) 60 MG 24 hr tablet Take 1 tablet (60 mg total) by mouth daily.   ketoconazole (NIZORAL) 2 % shampoo Apply 1 application topically 2 (two) times a week.   Lancets (FREESTYLE) lancets USE TO CHECK BLOOD SUGAR ONCE DAILY   losartan (COZAAR) 100 MG tablet TAKE ONE TABLET BY MOUTH DAILY   meloxicam (MOBIC) 15 MG tablet Take 1 tablet (15 mg total) by mouth daily.   metFORMIN (GLUCOPHAGE-XR) 500 MG 24 hr tablet TAKE 2 TABLETS TWICE A DAY WITH MEALS   Multiple Vitamin (MULTIVITAMIN WITH MINERALS) TABS tablet Take 1 tablet by mouth daily.   naproxen sodium (ALEVE) 220 MG tablet Take 440 mg by mouth daily as needed (pain).   nitroGLYCERIN (NITROSTAT) 0.4 MG SL tablet Place 1 tablet (0.4 mg total) under the tongue every 5 (five) minutes as needed for chest pain.   pantoprazole (PROTONIX) 40 MG tablet Take 1 tablet (40 mg total) by mouth daily.   pregabalin (LYRICA) 150 MG  capsule TAKE 1 CAPSULE BY MOUTH TWO TIMES A DAY   senna (SENOKOT) 8.6 MG TABS tablet Take 1 tablet by mouth daily.   torsemide (DEMADEX) 20 MG tablet Take 2 tablets (40 mg total) by mouth 2 (two) times daily.   TRULICITY 1.5 MG/0.5ML SOPN INJECT 1.5 MG UNDER THE SKIN ONCE A WEEK   [DISCONTINUED] potassium chloride (KLOR-CON) 10 MEQ tablet Take 1 tablet (10 mEq total) by mouth 2 (two) times daily.   No facility-administered medications prior to visit.    Review of Systems     Objective    BP 133/78 (BP Location: Right Arm, Patient Position: Sitting, Cuff Size: Normal)   Pulse 61   Temp 97.7 F (36.5 C) (Oral)   Resp 16   Ht 5\' 7"  (1.702 m)   Wt 239 lb (108.4 kg)   SpO2 98%   BMI 37.43 kg/m    Physical Exam Vitals and nursing note reviewed.   Constitutional:      Appearance: Normal appearance. He is obese.  HENT:     Head: Normocephalic and atraumatic.  Eyes:     Pupils: Pupils are equal, round, and reactive to light.  Cardiovascular:     Rate and Rhythm: Normal rate and regular rhythm.     Pulses: Normal pulses.     Heart sounds: Normal heart sounds.  Pulmonary:     Effort: Pulmonary effort is normal.     Breath sounds: Normal breath sounds.  Musculoskeletal:        General: Swelling, tenderness and signs of injury present. Normal range of motion.     Cervical back: Normal range of motion.     Right lower leg: Tenderness present. 3+ Pitting Edema present.     Left lower leg: Tenderness present. 2+ Pitting Edema present.     Right ankle: Swelling and ecchymosis present.     Comments: Resolving ecchymosis to R ankle s/p fall  Tenderness noted on shin line with edema displaced for assessment   Feet:     Right foot:     Skin integrity: Skin integrity normal.     Left foot:     Skin integrity: Skin integrity normal.  Skin:    General: Skin is warm and dry.     Capillary Refill: Capillary refill takes less than 2 seconds.  Neurological:     General: No focal deficit present.     Mental Status: He is alert and oriented to person, place, and time. Mental status is at baseline.      No results found for any visits on 12/19/21.  Assessment & Plan     Problem List Items Addressed This Visit       Cardiovascular and Mediastinum   Chronic diastolic CHF (congestive heart failure) (HCC) - Primary    Acute on chronic, noted 20# weight increase in 1 year Recommend f/u of labs with increase in diuretic Dr 02/19/22 messaged to reach back out to patient Edema noted in BLE +1 JV noted in observation, not officially measured       Relevant Medications   metolazone (ZAROXOLYN) 5 MG tablet   potassium chloride (KLOR-CON) 10 MEQ tablet     Other   Foot drop, left    Chronic, stable Was fitted for PFO replacement; chart  review noted that it was ordered in May 2023 and patient has not received at this time Recommend f/u with June 2023, staff message sent      History of recent fall  Recurrent fall; noted bruising on R ankle s/p fall and occasional pain in R foot s/p fall 3+ edema in R leg; 2+ in leg leg; feels that edema is worsening his pain Recommend xrays at this time to ensure that patient did not have untreated fracture Continues to use cane later in day d/t mobility concerns      Relevant Orders   DG Foot Complete Right   DG Ankle Complete Right   Left hip pain    Chronic x2 months; may be associated with decreased mobility in L foot/ankle s/p foot drop Patient has not been wearing his previous PFO d/t poor fit/LE edema Will refer to Ortho for additional management       Relevant Orders   Ambulatory referral to Orthopedic Surgery   Localized edema    Acute on chronic, worsening 3+ in RLE 2+ in LLE Has been present for >2 months Feels that diuretic is not working Recommend additional diruetic, f/u with Gollan MD and consult to vascular  CMP completed to check Creatinine and Potassium baseline Recommend addition of knee level TED/compression hose with strict monitoring of sodium in diet, 1800mg   and water intake with goal of 64 oz.      Relevant Medications   potassium chloride (KLOR-CON) 10 MEQ tablet   Other Relevant Orders   Ambulatory referral to Vascular Surgery   Comprehensive metabolic panel   Morbid obesity (HCC)    Chronic, stable Body mass index is 37.43 kg/m. Discussed importance of healthy weight management Discussed diet and exercise         Return in about 4 weeks (around 01/16/2022) for chonic disease management.      Leilani Merl, FNP, have reviewed all documentation for this visit. The documentation on 12/19/21 for the exam, diagnosis, procedures, and orders are all accurate and complete.    Jacky Kindle, FNP  [facility] [phone]  (phone) [phone] (fax)  [facility]

## 2021-12-19 ENCOUNTER — Encounter: Payer: Self-pay | Admitting: Family Medicine

## 2021-12-19 ENCOUNTER — Ambulatory Visit (INDEPENDENT_AMBULATORY_CARE_PROVIDER_SITE_OTHER): Payer: Medicare PPO | Admitting: Family Medicine

## 2021-12-19 ENCOUNTER — Ambulatory Visit
Admission: RE | Admit: 2021-12-19 | Discharge: 2021-12-19 | Disposition: A | Discharge status: Home / Self Care | Payer: Medicare PPO | Attending: Family Medicine | Admitting: Family Medicine

## 2021-12-19 ENCOUNTER — Ambulatory Visit
Admission: RE | Admit: 2021-12-19 | Discharge: 2021-12-19 | Disposition: A | Discharge status: Home / Self Care | Payer: Medicare PPO | Source: Ambulatory Visit | Attending: Family Medicine | Admitting: Family Medicine

## 2021-12-19 VITALS — BP 133/78 | HR 61 | Temp 97.7°F | Resp 16 | Ht 67.0 in | Wt 239.0 lb

## 2021-12-19 DIAGNOSIS — R600 Localized edema: Secondary | ICD-10-CM | POA: Insufficient documentation

## 2021-12-19 DIAGNOSIS — Z9181 History of falling: Secondary | ICD-10-CM

## 2021-12-19 DIAGNOSIS — M21372 Foot drop, left foot: Secondary | ICD-10-CM

## 2021-12-19 DIAGNOSIS — M25552 Pain in left hip: Secondary | ICD-10-CM | POA: Insufficient documentation

## 2021-12-19 DIAGNOSIS — I5032 Chronic diastolic (congestive) heart failure: Secondary | ICD-10-CM | POA: Insufficient documentation

## 2021-12-19 MED ORDER — METOLAZONE 5 MG PO TABS: ORAL_TABLET | ORAL | 0 refills | Status: DC

## 2021-12-19 MED ORDER — POTASSIUM CHLORIDE ER 10 MEQ PO TBCR: 20.0000 meq | EXTENDED_RELEASE_TABLET | Freq: Two times a day (BID) | ORAL | 1 refills | Status: DC

## 2021-12-19 NOTE — Assessment & Plan Note (Signed)
Acute on chronic, noted 20# weight increase in 1 year Recommend f/u of labs with increase in diuretic Dr Mariah Milling messaged to reach back out to patient Edema noted in BLE +1 JV noted in observation, not officially measured

## 2021-12-19 NOTE — Assessment & Plan Note (Signed)
Chronic x2 months; may be associated with decreased mobility in L foot/ankle s/p foot drop Patient has not been wearing his previous PFO d/t poor fit/LE edema Will refer to Ortho for additional management

## 2021-12-19 NOTE — Assessment & Plan Note (Signed)
Chronic, stable Body mass index is 37.43 kg/m. Discussed importance of healthy weight management Discussed diet and exercise

## 2021-12-19 NOTE — Assessment & Plan Note (Signed)
Recurrent fall; noted bruising on R ankle s/p fall and occasional pain in R foot s/p fall 3+ edema in R leg; 2+ in leg leg; feels that edema is worsening his pain Recommend xrays at this time to ensure that patient did not have untreated fracture Continues to use cane later in day d/t mobility concerns

## 2021-12-19 NOTE — Assessment & Plan Note (Signed)
Chronic, stable Was fitted for PFO replacement; chart review noted that it was ordered in May 2023 and patient has not received at this time Recommend f/u with Allena Katz, staff message sent

## 2021-12-19 NOTE — Assessment & Plan Note (Signed)
Acute on chronic, worsening 3+ in RLE 2+ in LLE Has been present for >2 months Feels that diuretic is not working Recommend additional diruetic, f/u with Gollan MD and consult to vascular  CMP completed to check Creatinine and Potassium baseline Recommend addition of knee level TED/compression hose with strict monitoring of sodium in diet, 1800mg   and water intake with goal of 64 oz.

## 2021-12-20 LAB — COMPREHENSIVE METABOLIC PANEL
ALT: 65 IU/L — ABNORMAL HIGH (ref 0–44)
AST: 46 IU/L — ABNORMAL HIGH (ref 0–40)
Albumin/Globulin Ratio: 1.7 (ref 1.2–2.2)
Albumin: 4.8 g/dL (ref 3.8–4.8)
Alkaline Phosphatase: 101 IU/L (ref 44–121)
BUN/Creatinine Ratio: 19 (ref 10–24)
BUN: 25 mg/dL (ref 8–27)
Bilirubin Total: 0.4 mg/dL (ref 0.0–1.2)
CO2: 20 mmol/L (ref 20–29)
Calcium: 9.7 mg/dL (ref 8.6–10.2)
Chloride: 101 mmol/L (ref 96–106)
Creatinine, Ser: 1.29 mg/dL — ABNORMAL HIGH (ref 0.76–1.27)
Globulin, Total: 2.8 g/dL (ref 1.5–4.5)
Glucose: 119 mg/dL — ABNORMAL HIGH (ref 70–99)
Potassium: 4 mmol/L (ref 3.5–5.2)
Sodium: 139 mmol/L (ref 134–144)
Total Protein: 7.6 g/dL (ref 6.0–8.5)
eGFR: 62 mL/min/{1.73_m2} (ref >59)

## 2021-12-22 ENCOUNTER — Other Ambulatory Visit: Payer: Self-pay | Admitting: Family Medicine

## 2021-12-22 ENCOUNTER — Telehealth: Payer: Self-pay | Admitting: Emergency Medicine

## 2021-12-22 DIAGNOSIS — R600 Localized edema: Secondary | ICD-10-CM

## 2021-12-22 NOTE — Progress Notes (Signed)
Stable creatinine. Improved from 1 month ago. Gollan recommends hold Norvasc. His nurse, Maralyn Sago may also reach out. Ensure adequate water intake, 64 oz minimum, with use of new diuretic. Recommend labs/weight check on Thursday, 7/13. Stable liver enzymes; can do liver ultrasound with GI if desired.  Jacky Kindle, FNP  [facility] [address] [place], [place] [address] [phone] (phone) [phone] (fax) [facility]

## 2021-12-22 NOTE — Progress Notes (Signed)
Nurse appt made for weight check on 7/13 with repeat CMP.  Ankle xray confirms previous trauma and soft tissue swelling; no evidence of break.  Jacky Kindle, FNP  [facility] [address] [place], [place] [address] [phone] (phone) [phone] (fax) [facility]

## 2021-12-22 NOTE — Telephone Encounter (Signed)
-----   Message from Antonieta Iba, MD sent at 12/21/2021  1:24 PM EDT ----- Regarding: RE: Worsening edema in BLE, continued stuttered breating Deneene Tarver, Can we have him hold his amlodipine (this could contribute to leg swelling) Would be cautious with any use of metolazone to avoid dehydration Recent BMP done several days ago looks okay Recent echocardiogram with normal ejection fraction He may need to wear compression hose when he is sitting for long periods of time,  moderate his fluid intake If symptoms do not prove by holding amlodipine, we can see him in clinic in follow-up in several weeks time Need to try to get his weight trending downward through dietary changes  Htx TG   ----- Message ----- From: Jacky Kindle, FNP Sent: 12/19/2021   9:56 AM EDT To: Antonieta Iba, MD Subject: Worsening edema in BLE, continued stuttered #  Good morning, Dr Mariah Milling,  Onalee Hua was in office today with 3+ pitting edema to RLE and 2+ to LLE. He noted having a fall with ankle bruising, which continues to R foot. I have advised x-ray. He also has not received his PFO for his left foot drop, Patel's team, and I have contacted them given complaints of decreased mobility with L hip pain at end of day.  I have provided him with additional metolazone as he notes no increase in urination since adding additional torsemide from your conversation earlier this Spring. I have also advised pt to get CMP to check GFR/Creatinine and K+ levels. I didn't know if you/your team wanted to bring him back for an ECHO or additional f/u.  I have also referred pt to vascular for [place] for LE to evaluate need for intervention vs HFrEF management.  Thanks,  Jacky Kindle, FNP  [facility] [address] [place], [place] [address] [phone] (phone) [phone] (fax) [facility]

## 2021-12-22 NOTE — Telephone Encounter (Signed)
Attempted to call patient. No answer. Detailed message left (per DPR) for patient to hold his amlodipine, try compress hose and use metolazone sparingly.   Advised patient to call or send MyChart message with any questions or concerns.

## 2021-12-25 ENCOUNTER — Encounter: Payer: Self-pay | Admitting: Family Medicine

## 2021-12-25 ENCOUNTER — Ambulatory Visit (INDEPENDENT_AMBULATORY_CARE_PROVIDER_SITE_OTHER): Payer: Medicare PPO | Admitting: Family Medicine

## 2021-12-25 VITALS — Wt 235.0 lb

## 2021-12-25 DIAGNOSIS — R600 Localized edema: Secondary | ICD-10-CM | POA: Diagnosis not present

## 2021-12-25 NOTE — Progress Notes (Signed)
Discussed dietary changes and follow up needed with both cardiology and vascular; repeat CMP today. Weight decrease noted, down to 225#, then back up s/p hamburger yesterday. Endorses thirst; however, ensures adequate PO intake.  Jacky Kindle, FNP  [facility] [address] [place], [place] [address] [phone] (phone) [phone] (fax) [facility]

## 2021-12-25 NOTE — Progress Notes (Signed)
Patient is here for weight check and recheck of labs.

## 2021-12-26 ENCOUNTER — Other Ambulatory Visit: Payer: Self-pay | Admitting: Family Medicine

## 2021-12-26 ENCOUNTER — Encounter: Payer: Self-pay | Admitting: Family Medicine

## 2021-12-26 DIAGNOSIS — N179 Acute kidney failure, unspecified: Secondary | ICD-10-CM

## 2021-12-26 DIAGNOSIS — M5137 Other intervertebral disc degeneration, lumbosacral region: Secondary | ICD-10-CM

## 2021-12-26 DIAGNOSIS — M5416 Radiculopathy, lumbar region: Secondary | ICD-10-CM

## 2021-12-26 LAB — COMPREHENSIVE METABOLIC PANEL
ALT: 45 IU/L — ABNORMAL HIGH (ref 0–44)
AST: 33 IU/L (ref 0–40)
Albumin/Globulin Ratio: 1.6 (ref 1.2–2.2)
Albumin: 4.7 g/dL (ref 3.9–4.9)
Alkaline Phosphatase: 104 IU/L (ref 44–121)
BUN/Creatinine Ratio: 31 — ABNORMAL HIGH (ref 10–24)
BUN: 82 mg/dL (ref 8–27)
Bilirubin Total: 0.3 mg/dL (ref 0.0–1.2)
CO2: 19 mmol/L — ABNORMAL LOW (ref 20–29)
Calcium: 10.5 mg/dL — ABNORMAL HIGH (ref 8.6–10.2)
Chloride: 92 mmol/L — ABNORMAL LOW (ref 96–106)
Creatinine, Ser: 2.62 mg/dL — ABNORMAL HIGH (ref 0.76–1.27)
Globulin, Total: 2.9 g/dL (ref 1.5–4.5)
Glucose: 176 mg/dL — ABNORMAL HIGH (ref 70–99)
Potassium: 4.2 mmol/L (ref 3.5–5.2)
Sodium: 136 mmol/L (ref 134–144)
Total Protein: 7.6 g/dL (ref 6.0–8.5)
eGFR: 26 mL/min/{1.73_m2} — ABNORMAL LOW (ref >59)

## 2021-12-26 NOTE — Progress Notes (Signed)
Please stop metolazone if you haven't already done so. Focus on water as primary source of fluids, goal of 64 oz minimum per day. Hold torsemide as well until Monday until fluid status is corrected. Recommend repeat CMP next week to follow up on kidney function given decrease noted, more than anticipated.   Jacky Kindle, FNP  [facility] [address] [place], [place] [address] [phone] (phone) [phone] (fax) [facility]

## 2021-12-26 NOTE — Telephone Encounter (Signed)
[facility] faxed refill request for the following medications:  meloxicam (MOBIC) 15 MG tablet   Please advise.

## 2021-12-26 NOTE — Telephone Encounter (Signed)
-----   Message from Jacky Kindle, FNP sent at 12/26/2021  7:41 AM EDT ----- Please stop metolazone if you haven't already done so. Focus on water as primary source of fluids, goal of 64 oz minimum per day. Hold torsemide as well until Monday until fluid status is corrected. Recommend repeat CMP next week to follow up on kidney function given decrease noted, more than anticipated.   Jacky Kindle, FNP  [facility] [address] [place], [place] [address] [phone] (phone) [phone] (fax) [facility]

## 2021-12-31 ENCOUNTER — Telehealth: Payer: Self-pay | Admitting: Family Medicine

## 2021-12-31 DIAGNOSIS — M5137 Other intervertebral disc degeneration, lumbosacral region: Secondary | ICD-10-CM

## 2021-12-31 DIAGNOSIS — M5416 Radiculopathy, lumbar region: Secondary | ICD-10-CM

## 2021-12-31 MED ORDER — MELOXICAM 15 MG PO TABS: 15.0000 mg | ORAL_TABLET | Freq: Every day | ORAL | 0 refills | Status: DC

## 2021-12-31 NOTE — Telephone Encounter (Signed)
[facility] faxed refill request for the following medications:  meloxicam (MOBIC) 15 MG tablet   Please advise.

## 2021-12-31 NOTE — Addendum Note (Signed)
Addended by: Hyacinth Meeker on: 12/31/2021 11:03 AM   Modules accepted: Orders

## 2022-01-02 LAB — COMPREHENSIVE METABOLIC PANEL WITH GFR
ALT: 91 IU/L — ABNORMAL HIGH (ref 0–44)
AST: 54 IU/L — ABNORMAL HIGH (ref 0–40)
Albumin/Globulin Ratio: 1.8 (ref 1.2–2.2)
Albumin: 4.3 g/dL (ref 3.9–4.9)
Alkaline Phosphatase: 98 IU/L (ref 44–121)
BUN/Creatinine Ratio: 16 (ref 10–24)
BUN: 19 mg/dL (ref 8–27)
Bilirubin Total: 0.4 mg/dL (ref 0.0–1.2)
CO2: 18 mmol/L — ABNORMAL LOW (ref 20–29)
Calcium: 9.6 mg/dL (ref 8.6–10.2)
Chloride: 104 mmol/L (ref 96–106)
Creatinine, Ser: 1.18 mg/dL (ref 0.76–1.27)
Globulin, Total: 2.4 g/dL (ref 1.5–4.5)
Glucose: 132 mg/dL — ABNORMAL HIGH (ref 70–99)
Potassium: 4.4 mmol/L (ref 3.5–5.2)
Sodium: 138 mmol/L (ref 134–144)
Total Protein: 6.7 g/dL (ref 6.0–8.5)
eGFR: 68 mL/min/1.73

## 2022-01-02 NOTE — Progress Notes (Signed)
Hi Jonathon Newman,  Creatinine is the lowest it has been in 10 months! This is very reassuring given previous elevation last week. Kidney function is back to normal. Liver enzymes remain elevated. Could follow up with GI for liver US if desired.  Please let us know if you have any questions.  Thank you, Jacky Kindle, FNP  [facility] [address] [place], [place] [address] [phone] (phone) [phone] (fax) [facility]

## 2022-01-05 ENCOUNTER — Telehealth: Payer: Self-pay

## 2022-01-05 NOTE — Telephone Encounter (Signed)
Pt called to get lab results and got to discussing things with this NT. Pt states he has had a HA ongoing for 4-5 days. His RLE is double the size normally from swelling and LLE is swollen as well. He has gained 8lbs since stopping the medications from [facility] and Dr. Mariah Milling and pt's BP has been running elevated and feeling like he took a nitroglycerin. Pt's head was hurting so bad last night that wife put ice on back of neck and didn't help and encouraged pt to go to ED d/t feeling like he was breathing harder than normal but pt refused. I educated pt on symptoms of fluid overload and the signs of when to go to ED. Asked pt about scheduling appt with [facility] vs Dr. Mariah Milling since he is cardio but pt said it can be hard to get an appt with him so scheduled appt for 01/09/22 at 1100. Advised pt to monitor BS, BP, and weight closely until appt. Pt verbalized understanding.

## 2022-01-06 ENCOUNTER — Other Ambulatory Visit: Payer: Self-pay | Admitting: Family Medicine

## 2022-01-06 ENCOUNTER — Emergency Department: Payer: Medicare PPO

## 2022-01-06 ENCOUNTER — Other Ambulatory Visit: Payer: Self-pay

## 2022-01-06 DIAGNOSIS — R600 Localized edema: Secondary | ICD-10-CM | POA: Insufficient documentation

## 2022-01-06 DIAGNOSIS — I5030 Unspecified diastolic (congestive) heart failure: Secondary | ICD-10-CM | POA: Insufficient documentation

## 2022-01-06 DIAGNOSIS — N179 Acute kidney failure, unspecified: Secondary | ICD-10-CM | POA: Diagnosis not present

## 2022-01-06 DIAGNOSIS — I110 Hypertensive heart disease with heart failure: Secondary | ICD-10-CM | POA: Diagnosis not present

## 2022-01-06 DIAGNOSIS — M7989 Other specified soft tissue disorders: Secondary | ICD-10-CM | POA: Diagnosis present

## 2022-01-06 DIAGNOSIS — R7989 Other specified abnormal findings of blood chemistry: Secondary | ICD-10-CM

## 2022-01-06 LAB — CBC WITH DIFFERENTIAL/PLATELET
Abs Immature Granulocytes: 0.02 10*3/uL (ref 0.00–0.07)
Basophils Absolute: 0.1 10*3/uL (ref 0.0–0.1)
Basophils Relative: 1 %
Eosinophils Absolute: 0.3 10*3/uL (ref 0.0–0.5)
Eosinophils Relative: 5 %
HCT: 34.6 % — ABNORMAL LOW (ref 39.0–52.0)
Hemoglobin: 12 g/dL — ABNORMAL LOW (ref 13.0–17.0)
Immature Granulocytes: 0 %
Lymphocytes Relative: 27 %
Lymphs Abs: 1.6 10*3/uL (ref 0.7–4.0)
MCH: 30.8 pg (ref 26.0–34.0)
MCHC: 34.7 g/dL (ref 30.0–36.0)
MCV: 88.9 fL (ref 80.0–100.0)
Monocytes Absolute: 0.6 10*3/uL (ref 0.1–1.0)
Monocytes Relative: 11 %
Neutro Abs: 3.4 10*3/uL (ref 1.7–7.7)
Neutrophils Relative %: 56 %
Platelets: 161 10*3/uL (ref 150–400)
RBC: 3.89 MIL/uL — ABNORMAL LOW (ref 4.22–5.81)
RDW: 14.3 % (ref 11.5–15.5)
WBC: 6 10*3/uL (ref 4.0–10.5)
nRBC: 0 % (ref 0.0–0.2)

## 2022-01-06 LAB — COMPREHENSIVE METABOLIC PANEL
ALT: 60 U/L — ABNORMAL HIGH (ref 0–44)
AST: 40 U/L (ref 15–41)
Albumin: 4.2 g/dL (ref 3.5–5.0)
Alkaline Phosphatase: 76 U/L (ref 38–126)
Anion gap: 7 (ref 5–15)
BUN: 21 mg/dL (ref 8–23)
CO2: 21 mmol/L — ABNORMAL LOW (ref 22–32)
Calcium: 9.3 mg/dL (ref 8.9–10.3)
Chloride: 110 mmol/L (ref 98–111)
Creatinine, Ser: 1.72 mg/dL — ABNORMAL HIGH (ref 0.61–1.24)
GFR, Estimated: 44 mL/min — ABNORMAL LOW (ref >60)
Glucose, Bld: 143 mg/dL — ABNORMAL HIGH (ref 70–99)
Potassium: 4 mmol/L (ref 3.5–5.1)
Sodium: 138 mmol/L (ref 135–145)
Total Bilirubin: 0.9 mg/dL (ref 0.3–1.2)
Total Protein: 7.3 g/dL (ref 6.5–8.1)

## 2022-01-06 LAB — TROPONIN I (HIGH SENSITIVITY): Troponin I (High Sensitivity): 14 ng/L (ref <18)

## 2022-01-06 LAB — BRAIN NATRIURETIC PEPTIDE: B Natriuretic Peptide: 73 pg/mL (ref 0.0–100.0)

## 2022-01-06 NOTE — ED Triage Notes (Addendum)
Ambulatory to triage with c/o high BP, ongoing for months. Fluid retention x 3 months with edema to BLE. Also c/o headache x 4 days.Denies chest pain, reports dyspnea on exertion. Pt reports taking prescribed BP meds as directed, Per pt PCP stopped Lasix 07/14 " because they wanted to let my kidneys recover." Also reports he has had decreased liver function and is scheduled for [place] to dx cause.

## 2022-01-06 NOTE — Telephone Encounter (Signed)
Pt called back. BP today was 184/78 pulse 64. Pt reports that leg swelling continues. Rt leg much more swollen than left. Pt will go to ED to be seen. Pt will not be able to go until about 9pm

## 2022-01-06 NOTE — Telephone Encounter (Signed)
Tried calling patient. Left message to call back. OK for [facility] triage to advise.  ?

## 2022-01-07 ENCOUNTER — Emergency Department
Admission: EM | Admit: 2022-01-07 | Discharge: 2022-01-07 | Disposition: A | Discharge status: Home / Self Care | Payer: Medicare PPO | Attending: Emergency Medicine | Admitting: Emergency Medicine

## 2022-01-07 DIAGNOSIS — R600 Localized edema: Secondary | ICD-10-CM | POA: Diagnosis not present

## 2022-01-07 DIAGNOSIS — N179 Acute kidney failure, unspecified: Secondary | ICD-10-CM

## 2022-01-07 DIAGNOSIS — R609 Edema, unspecified: Secondary | ICD-10-CM

## 2022-01-07 DIAGNOSIS — I10 Essential (primary) hypertension: Secondary | ICD-10-CM

## 2022-01-07 LAB — TROPONIN I (HIGH SENSITIVITY): Troponin I (High Sensitivity): 14 ng/L (ref <18)

## 2022-01-07 NOTE — Telephone Encounter (Signed)
FYI  KP

## 2022-01-07 NOTE — ED Provider Notes (Signed)
[facility] Provider Note    Event Date/Time   First MD Initiated Contact with Patient 01/07/22 0033     (approximate)   History   Hypertension   HPI  Jonathon Newman is a 66 y.o. male whose medical history is notable for hypertension, obesity, OSA, and congestive heart failure with preserved ejection fraction of 55 to 60%.  He presents at the recommendation of his PCP.  He has been struggling with high blood pressure for months and occasional kidney problems for which she was taken off of his fluid pills.  He has been to his primary care doctor a couple of times in the past 2 weeks and said that his kidney function has changed quite a bit during that time.  He has also seen Dr. Rockey Situ in the past.  He said that he has had a little bit more swelling in his legs than usual but it is normal for him to be able to press his finger into his lower legs and leave a fingerprint.  He said he is not having any trouble breathing.  He denies chest pain.  He has had no recent fever.  He is still urinating without any difficulties.  He said he did not want to come in tonight but when he touch base with his primary care doctor she recommended that he come to the emergency department for additional evaluation.     Physical Exam   Triage Vital Signs: ED Triage Vitals  Enc Vitals Group     BP 01/06/22 2041 (!) 164/85     Pulse Rate 01/06/22 2041 63     Resp 01/06/22 2041 18     Temp 01/06/22 2041 98.4 F (36.9 C)     Temp Source 01/06/22 2041 Oral     SpO2 01/06/22 2041 96 %     Weight 01/06/22 2040 104.8 kg (231 lb)     Height 01/06/22 2040 1.702 m (5\' 7" )     Head Circumference --      Peak Flow --      Pain Score 01/06/22 2054 0     Pain Loc --      Pain Edu? --      Excl. in [place]? --     Most recent vital signs: Vitals:   01/06/22 2041 01/07/22 0018  BP: (!) 164/85 (!) 163/85  Pulse: 63 (!) 56  Resp: 18 16  Temp: 98.4 F (36.9 C) 98.1 F (36.7 C)  SpO2:  96% 96%     General: Awake, no distress.  Generally well appearing. CV:  Good peripheral perfusion.  Normal heart sounds.  Regular rate and rhythm. Resp:  Normal effort.  Lungs are clear to auscultation bilaterally. Abd:  No distention.  No tenderness to palpation. Other:  1+ pitting edema in bilateral lower extremities, right slightly worse than left.   ED Results / Procedures / Treatments   Labs (all labs ordered are listed, but only abnormal results are displayed) Labs Reviewed  CBC WITH DIFFERENTIAL/PLATELET - Abnormal; Notable for the following components:      Result Value   RBC 3.89 (*)    Hemoglobin 12.0 (*)    HCT 34.6 (*)    All other components within normal limits  COMPREHENSIVE METABOLIC PANEL - Abnormal; Notable for the following components:   CO2 21 (*)    Glucose, Bld 143 (*)    Creatinine, Ser 1.72 (*)    ALT 60 (*)    GFR, Estimated  44 (*)    All other components within normal limits  BRAIN NATRIURETIC PEPTIDE  TROPONIN I (HIGH SENSITIVITY)  TROPONIN I (HIGH SENSITIVITY)     EKG  ED ECG REPORT I, Loleta Rose, the attending physician, personally viewed and interpreted this ECG.  Date: 01/06/2022 EKG Time: 20: 59 Rate: 64 Rhythm: normal sinus rhythm with occasional premature atrial complexes QRS Axis: normal Intervals: normal ST/T Wave abnormalities: normal Narrative Interpretation: no evidence of acute ischemia    RADIOLOGY I viewed and interpreted the patient's one-view portable chest x-ray.  He has some cardiomegaly but no evidence of acute abnormality such as pneumonia or interstitial edema.  I also read the radiologist's report, which confirmed no acute findings.    PROCEDURES:  Critical Care performed: No  Procedures   MEDICATIONS ORDERED IN ED: Medications - No data to display   IMPRESSION / MDM / ASSESSMENT AND PLAN / ED COURSE  I reviewed the triage vital signs and the nursing notes.                               Differential diagnosis includes, but is not limited to, primary hypertension, acute kidney injury or acute renal failure, metabolic or electrolyte abnormality, ACS, hypertensive urgency/emergency.  Patient's presentation is most consistent with acute presentation with potential threat to life or bodily function.  Vital signs are notable for hypertension, otherwise reassuring.  Labs/studies ordered include EKG, 1 view chest x-ray, CBC with differential, CMP, high-sensitivity troponin x2, basic metabolic panel.  Work-up has been reassuring.  CBC with differential is essentially normal and BNP is within normal limits.  High-sensitivity troponins are also within normal limits.  CMP shows what may be some acute kidney injury with a creatinine of 1.72.  However I reviewed his last for results going back about 2 weeks, and his kidney function has varied widely.  2 weeks ago his creatinine was 2.6, then it completely went back within normal limits, now it is slightly elevated.  The patient is adamant that he does not want to stay in the hospital, and though I considered hospitalization given his erratic kidney function, it does not appear he is having an emergent medical condition at this time.  He would likely benefit from specialty consultation regarding management of his diuretics and CHF in the setting of some kidney dysfunction.  After we talked about it, I referred him to the heart failure clinic for additional evaluation and assistance.  He is agreeable to this plan.  I gave him my usual customary return precautions, and I encouraged him to continue taking his medications as recently recommended by his provider, and he will follow-up as an outpatient but return to the ED with new or worsening symptoms.   Of note, I also read the report on his echocardiogram from 12/11/2021 and verified his preserved ejection fraction of 55 to 60%.  FINAL CLINICAL IMPRESSION(S) / ED DIAGNOSES   Final diagnoses:   Essential hypertension  Peripheral edema  Acute kidney injury (HCC)     Rx / DC Orders   ED Discharge Orders          Ordered    AMB referral to CHF clinic       Comments: Patient has been struggling with heart failure, peripheral edema, diuretic management, acute kidney injury, etc, for quite awhile. Sees Dr. Mariah Milling as well as [facility].  Would benefit from your assistance with med management between  diuresis and kidney function.   01/07/22 0045             Note:  This document was prepared using Dragon voice recognition software and may include unintentional dictation errors.   Loleta Rose, MD 01/07/22 [phone]

## 2022-01-07 NOTE — Discharge Instructions (Addendum)
As we discussed, your kidney function has been highly variable over the last 2 weeks.  Your lab work tonight shows that you have a little bit of what is called a "acute kidney injury", but given that you are able to drink plenty of fluids and stay hydrated, there is no need for you to stay in the hospital (which you do not want to do anyway!).  However, we encourage you to follow-up with the [facility] heart failure clinic.  The provider, Clarisa Kindred, works closely with the cardiologists (like Dr. Mariah Milling) and with the local primary care providers to help patients with their heart failure, appropriate medication regimens, and similar issues.  We put in a referral and anticipate that someone from the clinic will be contacting you within a day or 2 to schedule a follow-up appointment.  You can also follow-up directly with your primary care provider and/or Dr. Mariah Milling, but we strongly encourage you to try going to the heart failure clinic.  In the meantime, continue taking your medication as previously recommended by your providers, including holding off on the fluid pill for now.  Stay hydrated to make sure your kidneys are still working well.  Return to the emergency department if you develop new or worsening symptoms that concern you.

## 2022-01-08 ENCOUNTER — Other Ambulatory Visit: Payer: Self-pay | Admitting: Family Medicine

## 2022-01-08 DIAGNOSIS — R7989 Other specified abnormal findings of blood chemistry: Secondary | ICD-10-CM

## 2022-01-09 ENCOUNTER — Encounter: Payer: Self-pay | Admitting: Family Medicine

## 2022-01-09 ENCOUNTER — Ambulatory Visit
Admission: RE | Admit: 2022-01-09 | Discharge: 2022-01-09 | Disposition: A | Discharge status: Home / Self Care | Payer: Medicare PPO | Source: Ambulatory Visit | Attending: Family Medicine | Admitting: Family Medicine

## 2022-01-09 ENCOUNTER — Ambulatory Visit (INDEPENDENT_AMBULATORY_CARE_PROVIDER_SITE_OTHER): Payer: Medicare PPO | Admitting: Family Medicine

## 2022-01-09 ENCOUNTER — Other Ambulatory Visit: Payer: Self-pay | Admitting: Family Medicine

## 2022-01-09 VITALS — BP 155/82 | HR 60 | Temp 97.6°F | Wt 238.0 lb

## 2022-01-09 DIAGNOSIS — N179 Acute kidney failure, unspecified: Secondary | ICD-10-CM | POA: Diagnosis not present

## 2022-01-09 DIAGNOSIS — I5032 Chronic diastolic (congestive) heart failure: Secondary | ICD-10-CM

## 2022-01-09 DIAGNOSIS — K760 Fatty (change of) liver, not elsewhere classified: Secondary | ICD-10-CM

## 2022-01-09 DIAGNOSIS — R7989 Other specified abnormal findings of blood chemistry: Secondary | ICD-10-CM | POA: Insufficient documentation

## 2022-01-09 DIAGNOSIS — R609 Edema, unspecified: Secondary | ICD-10-CM | POA: Insufficient documentation

## 2022-01-09 LAB — COMPREHENSIVE METABOLIC PANEL
ALT: 78 IU/L — ABNORMAL HIGH (ref 0–44)
AST: 44 IU/L — ABNORMAL HIGH (ref 0–40)
Albumin/Globulin Ratio: 1.9 (ref 1.2–2.2)
Albumin: 4.3 g/dL (ref 3.9–4.9)
Alkaline Phosphatase: 88 IU/L (ref 44–121)
BUN/Creatinine Ratio: 11 (ref 10–24)
BUN: 12 mg/dL (ref 8–27)
Bilirubin Total: 0.4 mg/dL (ref 0.0–1.2)
CO2: 19 mmol/L — ABNORMAL LOW (ref 20–29)
Calcium: 9.1 mg/dL (ref 8.6–10.2)
Chloride: 109 mmol/L — ABNORMAL HIGH (ref 96–106)
Creatinine, Ser: 1.14 mg/dL (ref 0.76–1.27)
Globulin, Total: 2.3 g/dL (ref 1.5–4.5)
Glucose: 143 mg/dL — ABNORMAL HIGH (ref 70–99)
Potassium: 4.9 mmol/L (ref 3.5–5.2)
Sodium: 141 mmol/L (ref 134–144)
Total Protein: 6.6 g/dL (ref 6.0–8.5)
eGFR: 71 mL/min/{1.73_m2} (ref >59)

## 2022-01-09 MED ORDER — TORSEMIDE 20 MG PO TABS: ORAL_TABLET | ORAL | 0 refills | Status: DC

## 2022-01-09 NOTE — Assessment & Plan Note (Signed)
Substantial more lymphedema in RLE; no pain or concern for DVTs However, pt has upcoming appt with [facility] for further assistance Encourage use of TED Encourage small, more frequent walks to assist Encourage 1500 mg sodium restriction with FW goal of 64 oz/day

## 2022-01-09 NOTE — Assessment & Plan Note (Signed)
Re-establish new diuresis goals given previous AKI -continue to hold losartan until return of labs -continue to hold mobic -continue to hold metolazone -resume torsemide at 20 mg today; with plan for 40 mg once daily until f/u with [facility] pharmacy team will assist with further medication mgmt to prevent side effects from additional use -pt report use of up to 100 mg of torsemide daily, dependent on his weight- as previously directed by cardiology -encouraged use of thigh high TED hose -encouraged strict control on sodium content in diet

## 2022-01-09 NOTE — Progress Notes (Signed)
Ensure note is seen/addressed- has appt today

## 2022-01-09 NOTE — Patient Instructions (Addendum)
Continue to hold [facility] Hold Losartan 100 mg until return of labs- follow up with Inetta Fermo Change dose of torsemide to 40 mg daily not BID; plan to do 20 mg tonight/this afternoon Do not restart metolazone until advised

## 2022-01-09 NOTE — Progress Notes (Signed)
Blood sugar remains elevated on CMP; however, Kidney function has stabilized to where it was 8 days ago. BUN remains stable. Liver enzymes elevated; continue to follow up with GI for fibro scan.  Very reassuring given the variation lately. I will defer to Alex/Tina/Dr Mariah Milling for further assistance regarding additional re-start.  Jacky Kindle, FNP  [facility] [address] [place], [place] [address] [phone] (phone) [phone] (fax) [facility]

## 2022-01-09 NOTE — Assessment & Plan Note (Signed)
Chronic, variable Reports elevated home blood pressures and complaints of worsening edema in RLE in addition to LLE Also has complaints of ongoing daily headaches Followed by [facility] Referral placed- appt next with with HF clinic, [facility] Repeat CMP

## 2022-01-09 NOTE — Progress Notes (Signed)
I,Roshena L Chambers,acting as a scribe for Gwyneth Sprout, FNP.,have documented all relevant documentation on the behalf of Gwyneth Sprout, FNP,as directed by  Gwyneth Sprout, FNP while in the presence of Gwyneth Sprout, FNP.   Established patient visit   Patient: Jonathon Newman   DOB: 05/18/1956   66 y.o. Male  MRN: [id number] Visit Date: 01/09/2022  Today's healthcare provider: Gwyneth Sprout, FNP  Re Introduced to nurse practitioner role and practice setting.  All questions answered.  Discussed provider/patient relationship and expectations.   Chief Complaint  Patient presents with   Follow-up   Subjective    HPI  Follow up ER visit  Patient was seen in ER for hypertension, edema, and AKI on 01/07/2022. Treatment for this included referring to heart failure clinic. He reports good compliance with treatment. He reports this condition is Unchanged.  -----------------------------------------------------------------------------------------   Medications: Outpatient Medications Prior to Visit  Medication Sig Note   allopurinol (ZYLOPRIM) 300 MG tablet Take 1 tablet (300 mg total) by mouth daily.    atorvastatin (LIPITOR) 40 MG tablet Take 1 tablet (40 mg total) by mouth daily.    Blood Glucose Monitoring Suppl (FREESTYLE LITE) DEVI To check blood sugar once daily    carvedilol (COREG) 25 MG tablet Take 1 tablet (25 mg total) by mouth 2 (two) times daily with a meal.    clobetasol (TEMOVATE) 0.05 % external solution Apply 1 application topically 2 (two) times daily.    cyclobenzaprine (FLEXERIL) 10 MG tablet TAKE 1 TABLET THREE TIMES A DAY AS NEEDED FOR MUSCLE SPASMS    ezetimibe (ZETIA) 10 MG tablet Take 1 tablet (10 mg total) by mouth daily.    glucose blood (FREESTYLE LITE) test strip USE TO CHECK BLOOD SUGAR ONCE DAILY    isosorbide mononitrate (IMDUR) 60 MG 24 hr tablet Take 1 tablet (60 mg total) by mouth daily.    ketoconazole (NIZORAL) 2 % shampoo Apply 1 application  topically 2 (two) times a week.    Lancets (FREESTYLE) lancets USE TO CHECK BLOOD SUGAR ONCE DAILY    metFORMIN (GLUCOPHAGE-XR) 500 MG 24 hr tablet TAKE 2 TABLETS TWICE A DAY WITH MEALS    Multiple Vitamin (MULTIVITAMIN WITH MINERALS) TABS tablet Take 1 tablet by mouth daily.    nitroGLYCERIN (NITROSTAT) 0.4 MG SL tablet Place 1 tablet (0.4 mg total) under the tongue every 5 (five) minutes as needed for chest pain.    pantoprazole (PROTONIX) 40 MG tablet Take 1 tablet (40 mg total) by mouth daily.    potassium chloride (KLOR-CON) 10 MEQ tablet Take 2 tablets (20 mEq total) by mouth 2 (two) times daily.    pregabalin (LYRICA) 150 MG capsule TAKE 1 CAPSULE BY MOUTH TWO TIMES A DAY    senna (SENOKOT) 8.6 MG TABS tablet Take 1 tablet by mouth daily.    TRULICITY 1.5 SA/6.3KZ SOPN INJECT 1.5 MG UNDER THE SKIN ONCE A WEEK    [DISCONTINUED] losartan (COZAAR) 100 MG tablet TAKE ONE TABLET BY MOUTH DAILY 01/09/2022: AKI   [DISCONTINUED] meloxicam (MOBIC) 15 MG tablet Take 1 tablet (15 mg total) by mouth daily. 01/09/2022: AKI   [DISCONTINUED] metolazone (ZAROXOLYN) 5 MG tablet Take as needed for fluid balance with lasix and additional potassium 01/09/2022: AKI   [DISCONTINUED] naproxen sodium (ALEVE) 220 MG tablet Take 440 mg by mouth daily as needed (pain). 01/09/2022: AKI   [DISCONTINUED] torsemide (DEMADEX) 20 MG tablet Take 2 tablets (40 mg total) by mouth  2 (two) times daily.    No facility-administered medications prior to visit.    Review of Systems  Constitutional:  Negative for appetite change, chills and fever.  Respiratory:  Negative for chest tightness, shortness of breath and wheezing.   Cardiovascular:  Negative for chest pain and palpitations.  Gastrointestinal:  Negative for abdominal pain, nausea and vomiting.    Last CBC Lab Results  Component Value Date   WBC 6.0 01/06/2022   HGB 12.0 (L) 01/06/2022   HCT 34.6 (L) 01/06/2022   MCV 88.9 01/06/2022   MCH 30.8 01/06/2022   RDW  14.3 01/06/2022   PLT 161 41/28/7867   Last metabolic panel Lab Results  Component Value Date   GLUCOSE WILL FOLLOW 01/09/2022   NA 141 01/09/2022   K 4.9 01/09/2022   CL 109 (H) 01/09/2022   CO2 WILL FOLLOW 01/09/2022   BUN WILL FOLLOW 01/09/2022   CREATININE WILL FOLLOW 01/09/2022   GFRNONAA 44 (L) 01/06/2022   CALCIUM WILL FOLLOW 01/09/2022   PROT WILL FOLLOW 01/09/2022   ALBUMIN WILL FOLLOW 01/09/2022   LABGLOB WILL FOLLOW 01/09/2022   AGRATIO WILL FOLLOW 01/09/2022   BILITOT WILL FOLLOW 01/09/2022   ALKPHOS WILL FOLLOW 01/09/2022   AST WILL FOLLOW 01/09/2022   ALT WILL FOLLOW 01/09/2022   ANIONGAP 7 01/06/2022   Last lipids Lab Results  Component Value Date   CHOL 106 11/06/2021   HDL 27 (L) 11/06/2021   LDLCALC UNABLE TO CALCULATE IF TRIGLYCERIDE OVER 400 mg/dL 11/06/2021   LDLDIRECT 34.8 11/06/2021   TRIG 441 (H) 11/06/2021   CHOLHDL 3.9 11/06/2021   Last hemoglobin A1c Lab Results  Component Value Date   HGBA1C 6.0 (H) 11/06/2021   Last thyroid functions Lab Results  Component Value Date   TSH 1.600 02/02/2018       Objective    BP (!) 155/82 (BP Location: Left Arm, Patient Position: Sitting, Cuff Size: Large)   Pulse 60   Temp 97.6 F (36.4 C) (Oral)   Wt 238 lb (108 kg)   SpO2 95% Comment: room air  BMI 37.28 kg/m   BP Readings from Last 3 Encounters:  01/09/22 (!) 155/82  01/07/22 (!) 163/85  12/19/21 133/78   Wt Readings from Last 3 Encounters:  01/09/22 238 lb (108 kg)  01/06/22 231 lb (104.8 kg)  12/25/21 235 lb (106.6 kg)   SpO2 Readings from Last 3 Encounters:  01/09/22 95%  01/07/22 96%  12/19/21 98%      Physical Exam Vitals and nursing note reviewed.  Constitutional:      Appearance: Normal appearance. He is obese.  HENT:     Head: Normocephalic and atraumatic.  Eyes:     Pupils: Pupils are equal, round, and reactive to light.  Cardiovascular:     Rate and Rhythm: Normal rate and regular rhythm.     Pulses: Normal  pulses.     Heart sounds: Normal heart sounds.  Pulmonary:     Effort: Pulmonary effort is normal.     Breath sounds: Normal breath sounds.  Musculoskeletal:        General: Tenderness present. Normal range of motion.     Cervical back: Normal range of motion.     Right lower leg: 3+ Pitting Edema present.     Left lower leg: 3+ Pitting Edema present.     Comments: Hip pain in L hip; use of cane  Skin:    General: Skin is warm and dry.  Capillary Refill: Capillary refill takes less than 2 seconds.  Neurological:     General: No focal deficit present.     Mental Status: He is alert and oriented to person, place, and time. Mental status is at baseline.     Results for orders placed or performed in visit on 01/09/22  Comprehensive Metabolic Panel (CMET)  Result Value Ref Range   Glucose WILL FOLLOW    BUN WILL FOLLOW    Creatinine, Ser WILL FOLLOW    eGFR WILL FOLLOW    BUN/Creatinine Ratio WILL FOLLOW    Sodium 141 134 - 144 mmol/L   Potassium 4.9 3.5 - 5.2 mmol/L   Chloride 109 (H) 96 - 106 mmol/L   CO2 WILL FOLLOW    Calcium WILL FOLLOW    Total Protein WILL FOLLOW    Albumin WILL FOLLOW    Globulin, Total WILL FOLLOW    Albumin/Globulin Ratio WILL FOLLOW    Bilirubin Total WILL FOLLOW    Alkaline Phosphatase WILL FOLLOW    AST WILL FOLLOW    ALT WILL FOLLOW     Assessment & Plan     Problem List Items Addressed This Visit       Cardiovascular and Mediastinum   Chronic diastolic CHF (congestive heart failure) (HCC)    Chronic, variable Reports elevated home blood pressures and complaints of worsening edema in RLE in addition to LLE Also has complaints of ongoing daily headaches Followed by Rockey Situ Referral placed- appt next with with HF clinic, [facility] Repeat CMP      Relevant Medications   torsemide (DEMADEX) 20 MG tablet     Genitourinary   AKI (acute kidney injury) ([place]) - Primary    Re-establish new diuresis goals given previous AKI -continue to  hold losartan until return of labs -continue to hold mobic -continue to hold metolazone -resume torsemide at 20 mg today; with plan for 40 mg once daily until f/u with [facility] team will assist with further medication mgmt to prevent side effects from additional use -pt report use of up to 100 mg of torsemide daily, dependent on his weight- as previously directed by cardiology -encouraged use of thigh high TED hose -encouraged strict control on sodium content in diet      Relevant Medications   torsemide (DEMADEX) 20 MG tablet   Other Relevant Orders   Comprehensive Metabolic Panel (CMET) (Completed)     Other   3+ pitting edema    Substantial more lymphedema in RLE; no pain or concern for DVTs However, pt has upcoming appt with [facility] for further assistance Encourage use of TED Encourage small, more frequent walks to assist Encourage 1500 mg sodium restriction with FW goal of 64 oz/day       Relevant Medications   torsemide (DEMADEX) 20 MG tablet     Return in about 4 weeks (around 02/06/2022) for chonic disease management.      Vonna Kotyk, FNP, have reviewed all documentation for this visit. The documentation on 01/09/22 for the exam, diagnosis, procedures, and orders are all accurate and complete.  Billing reflects consult of [facility] pharmacist- Junius Argyle, Pharm D as well as Esmond Plants, MD.   Rockey Situ 7/28 1141: "suspect he might need compression hose/TEDS. seems to have chronic edema, suspect component of dependant edema. h/a and dissy not sure. BP not looking low by hx"  Gwyneth Sprout, Fate [phone] (phone) [phone] (fax)  [facility] [place]

## 2022-01-11 ENCOUNTER — Other Ambulatory Visit: Payer: Self-pay | Admitting: Family Medicine

## 2022-01-12 ENCOUNTER — Other Ambulatory Visit: Payer: Self-pay | Admitting: Cardiovascular Disease

## 2022-01-12 ENCOUNTER — Other Ambulatory Visit: Payer: Self-pay | Admitting: Family Medicine

## 2022-01-12 DIAGNOSIS — N179 Acute kidney failure, unspecified: Secondary | ICD-10-CM

## 2022-01-12 DIAGNOSIS — E1151 Type 2 diabetes mellitus with diabetic peripheral angiopathy without gangrene: Secondary | ICD-10-CM

## 2022-01-12 DIAGNOSIS — I5032 Chronic diastolic (congestive) heart failure: Secondary | ICD-10-CM

## 2022-01-13 ENCOUNTER — Telehealth: Payer: Self-pay | Admitting: *Deleted

## 2022-01-13 ENCOUNTER — Ambulatory Visit: Payer: Medicare PPO | Attending: Family | Admitting: Family

## 2022-01-13 ENCOUNTER — Encounter: Payer: Self-pay | Admitting: Family

## 2022-01-13 VITALS — BP 129/71 | HR 76 | Resp 14 | Ht 67.0 in | Wt 230.0 lb

## 2022-01-13 DIAGNOSIS — I110 Hypertensive heart disease with heart failure: Secondary | ICD-10-CM | POA: Diagnosis present

## 2022-01-13 DIAGNOSIS — I2510 Atherosclerotic heart disease of native coronary artery without angina pectoris: Secondary | ICD-10-CM | POA: Insufficient documentation

## 2022-01-13 DIAGNOSIS — I890 Lymphedema, not elsewhere classified: Secondary | ICD-10-CM | POA: Insufficient documentation

## 2022-01-13 DIAGNOSIS — I5032 Chronic diastolic (congestive) heart failure: Secondary | ICD-10-CM | POA: Diagnosis present

## 2022-01-13 DIAGNOSIS — E119 Type 2 diabetes mellitus without complications: Secondary | ICD-10-CM | POA: Insufficient documentation

## 2022-01-13 DIAGNOSIS — I10 Essential (primary) hypertension: Secondary | ICD-10-CM

## 2022-01-13 DIAGNOSIS — K219 Gastro-esophageal reflux disease without esophagitis: Secondary | ICD-10-CM | POA: Diagnosis not present

## 2022-01-13 DIAGNOSIS — Z9049 Acquired absence of other specified parts of digestive tract: Secondary | ICD-10-CM | POA: Insufficient documentation

## 2022-01-13 DIAGNOSIS — I25118 Atherosclerotic heart disease of native coronary artery with other forms of angina pectoris: Secondary | ICD-10-CM

## 2022-01-13 DIAGNOSIS — N400 Enlarged prostate without lower urinary tract symptoms: Secondary | ICD-10-CM | POA: Diagnosis not present

## 2022-01-13 NOTE — Patient Instructions (Addendum)
Continue weighing daily and call for an overnight weight gain of 3 pounds or more or a weekly weight gain of more than 5 pounds.   If you have voicemail, please make sure your mailbox is cleaned out so that we may leave a message and please make sure to listen to any voicemails.    Keep daily sodium intake to less than 2000mg     Try to keep daily fluid intake to 60-64 ounces daily

## 2022-01-13 NOTE — Chronic Care Management (AMB) (Signed)
  Chronic Care Management   Outreach Note  01/13/2022 Name: Jonathon Newman MRN: [id number] DOB: June 06, 1956  Jonathon Newman is a 66 y.o. year old male who is a primary care patient of Jacky Kindle, FNP. I reached out to Jonathon Newman by phone today in response to a referral sent by Jonathon Newman primary care provider.  An unsuccessful telephone outreach was attempted today. The patient was referred to the case management team for assistance with care management and care coordination.   Follow Up Plan: A HIPAA compliant phone message was left for the patient providing contact information and requesting a return call.   Jonathon Newman, Jonathon Newman Care Coordination Care Guide Direct Dial: [phone]

## 2022-01-13 NOTE — Telephone Encounter (Signed)
Requested medication (s) are due for refill today - expired Rx  Requested medication (s) are on the active medication list -yes  Future visit scheduled -no  Last refill: 12/23/20 #100 4RF  Notes to clinic: expired Rx  Requested Prescriptions  Pending Prescriptions Disp Refills   FREESTYLE LITE test strip [Pharmacy Med Name: FREESTYLE LITE STRIPS 50'S] 100 strip 3    Sig: USE TO CHECK BLOOD SUGAR ONCE DAILY     Endocrinology: Diabetes - Testing Supplies Passed - 01/11/2022 10:35 AM      Passed - Valid encounter within last 12 months    Recent Outpatient Visits           4 days ago AKI (acute kidney injury) [facility])   [facility] Merita Norton T, FNP   3 weeks ago Chronic diastolic CHF (congestive heart failure) [facility])   [facility] Merita Norton T, FNP   6 months ago Psoriasis of scalp   [facility] Merita Norton T, FNP   9 months ago History of recent fall   [facility] Merita Norton T, FNP   1 year ago Type 2 diabetes mellitus with diabetic peripheral angiopathy without gangrene, without long-term current use of insulin [facility])   [facility], Marzella Schlein, MD                 Requested Prescriptions  Pending Prescriptions Disp Refills   FREESTYLE LITE test strip [Pharmacy Med Name: FREESTYLE LITE STRIPS 50'S] 100 strip 3    Sig: USE TO CHECK BLOOD SUGAR ONCE DAILY     Endocrinology: Diabetes - Testing Supplies Passed - 01/11/2022 10:35 AM      Passed - Valid encounter within last 12 months    Recent Outpatient Visits           4 days ago AKI (acute kidney injury) [facility])   [facility] Merita Norton T, FNP   3 weeks ago Chronic diastolic CHF (congestive heart failure) [facility])   [facility] Merita Norton T, FNP   6 months ago Psoriasis of scalp   [facility] Merita Norton T, FNP   9 months ago History of recent fall   [facility] Merita Norton T, FNP   1 year ago Type 2 diabetes mellitus with diabetic peripheral angiopathy without gangrene, without long-term current use of insulin [facility])   [facility], Marzella Schlein, MD

## 2022-01-13 NOTE — Progress Notes (Signed)
Patient ID: Jonathon Newman, male    DOB: [birth year], 66 y.o.   MRN: [id number]  HPI  Jonathon Newman is a 66 y/o male with a history of CAD, DM, hyperlipidemia, HTN, BPH, GERD, previous tobacco use and chronic heart failure.   Echo report from 12/11/21 reviewed and showed an EF of 55-60%  LHC done 05/02/18 and showed: The left ventricular systolic function is normal. LV end diastolic pressure is mildly elevated. The left ventricular ejection fraction is 55-65% by visual estimate. Prox RCA to Mid RCA lesion is 10% stenosed. Prox RCA lesion is 30% stenosed. Dist RCA lesion is 20% stenosed. SVG graft was visualized by angiography. The graft exhibits mild diffuse disease. Ost Cx to Prox Cx lesion is 95% stenosed. Mid LM to Dist LM lesion is 80% stenosed. LIMA and is normal in caliber. The graft exhibits no disease.  1. Significant underlying left main and RCA disease with patent grafts including LIMA to LAD and SVG to OM 2.  Patent RCA stent with minimal restenosis. 2.  Normal LV systolic function.  Mildly elevated left ventricular end-diastolic pressure at 14 to 15 mmHg.  Was in the ED 01/07/22 due to HTN, edema and AKI where he was evaluated and released.   He presents today for his initial visit with a chief complaint of moderate shortness of breath with minimal exertion. Describes this as chronic in nature. He has associated fatigue, pedal edema, palpitations, dizziness, headaches, chronic back pain and fluctuating weight along with this. He denies any difficulty sleeping, abdominal distention, chest pain, wheezing or cough.   Weighing daily and weight has ranged from 225-215 but is steadily declining. Home glucose has ranged from 93-173.   Not adding salt and is trying to use salt free seasoning such as Mrs Sharilyn Sites or No Salt. Is drinking LOTS of fluids because he says that his mouth stays dry "all the time" due to his diabetes. Estimates that his cup holds ~ 30 ounces and he's filling that  up 3- 4 times daily.   Is supposed to be wearing compression socks but hasn't gotten them yet. Does have knee high ones at home but is needing to get thigh high ones.   Past Medical History:  Diagnosis Date   Anginal pain (HCC)    Arthritis    BPH (benign prostatic hyperplasia)    CHF (congestive heart failure) (HCC)    Coronary artery disease 2010   a.) LHC 2010: high grade RCA stenosis -> 2.5 x 42mm Cypher DES to RCA. b.) NSTEMI 2016 -> PCI revealed pat RCA stent; sig dLM Dz with FFR ratio 0.76 and oLCx stenosis; ref to CVTS. c.) 2v CABG 04/21/2015; LIMA-LAD, SVG-OM2. d.) [facility] 05/02/2018: EF 55-65%; LVEDP 14-15 mmHg; 10% p-m RCA, 30% pRCA, 20% dRCA, 95% o-pLCx, 80% m-dLM; LIMA graft pat. SVG graft with mild diffuse Dz.   DDD (degenerative disc disease), lumbar    Dyspnea    GERD (gastroesophageal reflux disease)    Hypercholesteremia    Hypertension    Left foot drop    NSTEMI (non-ST elevated myocardial infarction) (HCC) 2016   a.) PCI revealed patent RCA stent; significant dLM disease with FFR ratio 0.76 and oLCx stenosis; refer to CVTS for CABG.   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 LIMA to LAD amd SVG to OM2   CORONARY STENT PLACEMENT  2010   Cordis Cypher Sirolimus-eluting stent 2.50 mm x 26 mm placed to the RCA at [facility]    ESOPHAGEAL MANOMETRY N/A 08/04/2017   Procedure: ESOPHAGEAL MANOMETRY (EM);  Surgeon: Toney Reil, MD;  Location: [facility] ENDOSCOPY;  Service: Endoscopy;  Laterality: N/A;   FRACTURE SURGERY     KNEE SURGERY     KNEE SURGERY     right  knee    LAMINECTOMY     "plate in neck C7-E9"   LEFT HEART CATH AND CORS/GRAFTS ANGIOGRAPHY N/A 05/02/2018   Procedure: CORS/GRAFTS ANGIOGRAPHY;  Surgeon: Iran Ouch, MD;  Location: [facility] INVASIVE CV LAB;  Service: Cardiovascular;  Laterality: N/A;   RIGHT/LEFT HEART CATH AND CORONARY ANGIOGRAPHY Bilateral 05/02/2018   Procedure: LEFT HEART CATH;  Surgeon: Iran Ouch, MD;  Location: [facility] INVASIVE CV LAB;  Service: Cardiovascular;  Laterality: Bilateral;   THORACIC LAMINECTOMY FOR SPINAL CORD STIMULATOR N/A 05/26/2021   Procedure: THORACIC SPINAL CORD STIMULATOR AND PULSE GENERATOR PLACEMENT (MEDTRONIC);  Surgeon: Lucy Chris, MD;  Location: [facility] ORS;  Service: Neurosurgery;  Laterality: N/A;   VASECTOMY     Family History  Problem Relation Age of Onset   Arthritis Mother    Hyperlipidemia Father    Hypertension Father    Heart attack Father 15   Heart murmur Brother    Valvular heart disease Brother    Cataracts Maternal Grandmother    Glaucoma Maternal Grandmother    Cancer Maternal Grandfather    Heart attack Paternal Grandfather    Hypertension Paternal Grandfather    Prostate cancer Neg Hx    Bladder Cancer Neg Hx    Kidney cancer Neg Hx    Social History   Tobacco Use   Smoking status: Former    Packs/day: 0.25    Years: 2.00    Total pack years: 0.50    Types: Cigarettes    Quit date: 05/26/2005    Years since quitting: 16.6   Smokeless tobacco: Former    Types: Chew  Substance Use Topics   Alcohol use: No   Allergies  Allergen Reactions   Colchicine Other (See Comments)    Palpitations and headache   Lisinopril Cough   Prior to Admission medications   Medication Sig Start Date End Date Taking? Authorizing Provider  allopurinol (ZYLOPRIM) 300 MG tablet Take 1 tablet (300 mg total) by mouth daily. 08/04/21  Yes Jacky Kindle, FNP  atorvastatin (LIPITOR) 40 MG tablet Take 1 tablet (40 mg total) by mouth daily. 11/05/21  Yes Antonieta Iba, MD   Blood Glucose Monitoring Suppl (FREESTYLE LITE) DEVI To check blood sugar once daily 07/20/19  Yes Margaretann Loveless, PA-C  carvedilol (COREG) 25 MG tablet Take 1 tablet (25 mg total) by mouth 2 (two) times daily with a meal. 11/05/21  Yes Gollan, Tollie Pizza, MD  clobetasol (TEMOVATE) 0.05 % external solution Apply 1 application topically 2 (two) times daily. 07/29/21  Yes Merita Norton T, FNP  ezetimibe (ZETIA) 10 MG tablet TAKE ONE TABLET BY MOUTH DAILY 01/12/22  Yes Gollan, Tollie Pizza, MD  glucose blood (FREESTYLE  LITE) test strip USE TO CHECK BLOOD SUGAR ONCE DAILY 12/23/20  Yes Bacigalupo, Marzella Schlein, MD  isosorbide mononitrate (IMDUR) 60 MG 24 hr tablet Take 1 tablet (60 mg total) by mouth daily. 11/05/21  Yes Gollan, Tollie Pizza, MD  ketoconazole (NIZORAL) 2 % shampoo Apply 1 application topically 2 (two) times a week. 08/11/21  Yes Jacky Kindle, FNP  Lancets (FREESTYLE) lancets USE TO CHECK BLOOD SUGAR ONCE DAILY 12/23/20  Yes Bacigalupo, Marzella Schlein, MD  LOSARTAN POTASSIUM PO Take 100 mg by mouth daily.   Yes [provider]  metFORMIN (GLUCOPHAGE-XR) 500 MG 24 hr tablet TAKE 2 TABLETS TWICE A DAY WITH MEALS 12/04/21  Yes Jacky Kindle, FNP  Multiple Vitamin (MULTIVITAMIN WITH MINERALS) TABS tablet Take 1 tablet by mouth daily.   Yes [provider]  pantoprazole (PROTONIX) 40 MG tablet Take 1 tablet (40 mg total) by mouth daily. 08/04/21  Yes Jacky Kindle, FNP  potassium chloride (KLOR-CON) 10 MEQ tablet Take 2 tablets (20 mEq total) by mouth 2 (two) times daily. 12/19/21  Yes Jacky Kindle, FNP  pregabalin (LYRICA) 150 MG capsule TAKE 1 CAPSULE BY MOUTH TWO TIMES A DAY 08/04/21  Yes Jacky Kindle, FNP  senna (SENOKOT) 8.6 MG TABS tablet Take 1 tablet by mouth daily.   Yes [provider]  torsemide (DEMADEX) 20 MG tablet 20 mg 7/28 PM/noon; 40 mg 7/29 AM, ongoing 40 mg daily until seen by heart failure team Patient taking differently: Take 40 mg by mouth daily. 01/09/22  Yes  Jacky Kindle, FNP  TRULICITY 1.5 MG/0.5ML SOPN INJECT 1.5 MG UNDER THE SKIN ONCE A WEEK 05/14/21  Yes Jacky Kindle, FNP  nitroGLYCERIN (NITROSTAT) 0.4 MG SL tablet Place 1 tablet (0.4 mg total) under the tongue every 5 (five) minutes as needed for chest pain. Patient not taking: Reported on 01/13/2022 01/22/20   Antonieta Iba, MD    Review of Systems  Constitutional:  Positive for fatigue (tire easily). Negative for appetite change.  HENT:  Negative for congestion, postnasal drip and sore throat.   Eyes: Negative.   Respiratory:  Positive for shortness of breath (easily). Negative for cough and wheezing.   Cardiovascular:  Positive for palpitations (at times) and leg swelling (improving). Negative for chest pain.  Gastrointestinal:  Negative for abdominal distention and abdominal pain.  Endocrine: Negative.   Genitourinary: Negative.   Musculoskeletal:  Positive for back pain (has stimulator implanted). Negative for neck pain.  Skin: Negative.   Allergic/Immunologic: Negative.   Neurological:  Positive for dizziness, light-headedness and headaches.  Hematological:  Negative for adenopathy. Does not bruise/bleed easily.  Psychiatric/Behavioral:  Negative for dysphoric mood and sleep disturbance (wearing CPAP, sleeping on 2 pillows). The patient is not nervous/anxious.     Vitals:   01/13/22 0935  BP: 129/71  Pulse: 76  Resp: 14  SpO2: 98%  Weight: 230 lb (104.3 kg)  Height: 5\' 7"  (1.702 m)   Wt Readings from Last 3 Encounters:  01/13/22 230 lb (104.3 kg)  01/09/22 238 lb (108 kg)  01/06/22 231 lb (104.8 kg)   Lab Results  Component Value Date   CREATININE 1.14 01/09/2022   CREATININE 1.72 (H) 01/06/2022   CREATININE 1.18 01/01/2022   Physical Exam Vitals and nursing note reviewed.  Constitutional:      Appearance: Normal appearance.  HENT:     Head: Normocephalic and atraumatic.  Cardiovascular:     Rate and Rhythm: Normal rate and regular  rhythm.  Pulmonary:      Effort: Pulmonary effort is normal. No respiratory distress.     Breath sounds: No wheezing or rales.  Abdominal:     General: There is no distension.     Palpations: Abdomen is soft.  Musculoskeletal:        General: No tenderness.     Cervical back: Normal range of motion and neck supple.     Right lower leg: Edema (1+ pitting) present.     Left lower leg: Edema (1+ pitting) present.  Skin:    General: Skin is warm and dry.  Neurological:     General: No focal deficit present.     Mental Status: He is alert and oriented to person, place, and time.  Psychiatric:        Mood and Affect: Mood normal.        Behavior: Behavior normal.        Thought Content: Thought content normal.    Assessment & Plan:  1: Chronic heart failure with preserved ejection fraction without structural changes- - NYHA class III - euvolemic today - weighing daily and weight has been fluctuating although on the decline; instructed to call for an overnight weight gain of > 2 pounds or a weekly weight gain of > 5 pounds - not adding "much" salt and says that he's trying; he is using more Mrs Sharilyn Sites, Vonna Drafts and has been reading food labels for sodium content - knows to keep daily fluid intake to 64 ounces but says that he's drinking way more than that due to his dry mouth from his diabetes; explained the rationale between his fluid intake, diuretic and edema. Encouraged him to use sugar free candy to suck on instead of drinking so much fluids - reviewed the use of furoscix and QR code provided so that he can go home and watch the video - BNP 01/06/22 was 73.0  2: HTN- - BP looks good (129/71) - saw PCP Suzie Portela) 01/09/22 - BMP 01/09/22 reviewed and showed sodium 141, potassium 4.9, creatinine 1.14 and GFR 71 - recheck lab work in a few weeks  3: DM- - A1c 11/06/21 was 6.0% - home glucose fluctuating from 93-173 - this morning at home it was 125  4: CAD- - saw cardiology Mariah Milling) 11/05/21 - says that he hasn't  needed NTG in > 1 year  5: Lymphedema- - stage 2 - elevates his legs some when sitting for long periods of time - limited in his ability to exercise due to his symptoms - emphasized getting compression socks to wear with putting them on every morning and removal at bedtime - consider compression boots if edema persists   Patient did not bring his medications nor a list. Each medication was verbally reviewed with the patient and he was encouraged to bring the bottles to every visit to confirm accuracy of list.   Return in 3 weeks, sooner if needed.

## 2022-01-13 NOTE — Chronic Care Management (AMB) (Signed)
  Chronic Care Management   Note  01/13/2022 Name: Jonathon Newman MRN: [id number] DOB: [birth year]  Jonathon Newman is a 66 y.o. year old male who is a primary care patient of Gwyneth Sprout, FNP. I reached out to Myrtie Cruise by phone today in response to a referral sent by Jonathon Newman PCP.  [name] was given information about Chronic Care Management services today including:  CCM service includes personalized support from designated clinical staff supervised by his physician, including individualized plan of care and coordination with other care providers 24/7 contact phone numbers for assistance for urgent and routine care needs. Service will only be billed when office clinical staff spend 20 minutes or more in a month to coordinate care. Only one practitioner may furnish and bill the service in a calendar month. The patient may stop CCM services at any time (effective at the end of the month) by phone call to the office staff. The patient is responsible for co-pay (up to 20% after annual deductible is met) if co-pay is required by the individual health plan.   Patient agreed to services and verbal consent obtained.   Follow up plan: Telephone appointment with care management team member scheduled for: 01/16/2022  Julian Hy, [place] Direct Dial: [phone]

## 2022-01-15 ENCOUNTER — Telehealth: Payer: Self-pay

## 2022-01-15 NOTE — Progress Notes (Signed)
Chronic Care Management Pharmacy Assistant   Name: Jonathon Newman  MRN: [id number] DOB: [birth year]  Chart Review for the Clinical pharmacist for 01/20/2022 at 9:30 am.   Conditions to be addressed/monitored: CHF, HTN, HLD, DMII, GERD, and Lumbar radiculopathy,DDD  Primary concerns for visit include: Patient denies any issues or concerns at this time.Patient reports he has dizziness, but is unsure if this is related to his medication.   Recent office visits:  01/09/2022 Merita Norton FNP (PCP) Hold losartan, Hold mobic, Hold metolazone, resume torsemide at 20 mg today; with plan for 40 mg once daily until f/u with [facility], Return in about 4 weeks 12/25/2021  Merita Norton FNP (PCP)  Stop metolazone , Hold torsemide as well until Monday until fluid status is corrected 12/19/2021  Merita Norton FNP (PCP) Start metolazone 5 mg , increase Potassium to 20 MEQ 2 times daily, Ambulatory referral to Orthopedic Surgery, Ambulatory referral to Vascular Surgery, Return in about 4 weeks   Recent consult visits:  01/13/2022 Angus Seller FNP (Cardiology) No Medication Changes noted 12/19/2021 Cam Hai PA-C - unable to see note 11/05/2021 Dr. Mariah Milling MD (Cardiology) No Medication Changes noted 08/28/2021 Nicholes Rough DPM (Podiatry) No Medication Changes noted   Hospital visits:  Medication Reconciliation was completed by comparing discharge summary, patient's EMR and Pharmacy list, and upon discussion with patient.  Admitted to the hospital on 01/07/2022 due to Essential Hypertension. Discharge date was 01/07/2022. Discharged from [facility].    New?Medications Started at [facility] Discharge:?? -started None ID  Medication Changes at Hospital Discharge: -Changed None ID  Medications Discontinued at Hospital Discharge: -Stopped None ID  Medications that remain the same after Hospital Discharge:??  -All other medications will remain the same.    Questions for Clinical  Pharmacist:   1.Are you able to connect with Patient ?yes     2.Confirmed appointment date/time with patient/caregiver? Confirm appointment on 01/20/2022 at 9:30 am with Julious Payer CPP      3.Visit type telephone     4.Patient/Caregiver instructed to bring medications to appointment. Patient is aware to bring all medications and supplements to appointment    5.What, if any, problems do you have getting your medications from the pharmacy? None      6.What is your top health concern to discuss at your upcoming visit?  Patient denies any issues or concerns at this time.Patient reports he has dizziness, but is unsure if this is related to his medication.   7.Have you seen any other providers since your last visit? No       Medications: Outpatient Encounter Medications as of 01/15/2022  Medication Sig   allopurinol (ZYLOPRIM) 300 MG tablet Take 1 tablet (300 mg total) by mouth daily.   atorvastatin (LIPITOR) 40 MG tablet Take 1 tablet (40 mg total) by mouth daily.   Blood Glucose Monitoring Suppl (FREESTYLE LITE) DEVI To check blood sugar once daily   carvedilol (COREG) 25 MG tablet Take 1 tablet (25 mg total) by mouth 2 (two) times daily with a meal.   clobetasol (TEMOVATE) 0.05 % external solution Apply 1 application topically 2 (two) times daily.   ezetimibe (ZETIA) 10 MG tablet TAKE ONE TABLET BY MOUTH DAILY   FREESTYLE LITE test strip USE TO CHECK BLOOD SUGAR ONCE DAILY   isosorbide mononitrate (IMDUR) 60 MG 24 hr tablet Take 1 tablet (60 mg total) by mouth daily.   ketoconazole (NIZORAL) 2 % shampoo Apply 1 application topically 2 (two) times a week.  Lancets (FREESTYLE) lancets USE TO CHECK BLOOD SUGAR ONCE DAILY   LOSARTAN POTASSIUM PO Take 100 mg by mouth daily.   metFORMIN (GLUCOPHAGE-XR) 500 MG 24 hr tablet TAKE 2 TABLETS TWICE A DAY WITH MEALS   Multiple Vitamin (MULTIVITAMIN WITH MINERALS) TABS tablet Take 1 tablet by mouth daily.   nitroGLYCERIN (NITROSTAT)  0.4 MG SL tablet Place 1 tablet (0.4 mg total) under the tongue every 5 (five) minutes as needed for chest pain. (Patient not taking: Reported on 01/13/2022)   pantoprazole (PROTONIX) 40 MG tablet Take 1 tablet (40 mg total) by mouth daily.   potassium chloride (KLOR-CON) 10 MEQ tablet Take 2 tablets (20 mEq total) by mouth 2 (two) times daily.   pregabalin (LYRICA) 150 MG capsule TAKE 1 CAPSULE BY MOUTH TWO TIMES A DAY   senna (SENOKOT) 8.6 MG TABS tablet Take 1 tablet by mouth daily.   torsemide (DEMADEX) 20 MG tablet 20 mg 7/28 PM/noon; 40 mg 7/29 AM, ongoing 40 mg daily until seen by heart failure team (Patient taking differently: Take 40 mg by mouth daily.)   TRULICITY 1.5 MG/0.5ML SOPN INJECT 1.5 MG UNDER THE SKIN ONCE A WEEK   No facility-administered encounter medications on file as of 01/15/2022.    Care Gaps: Ophthalmology Exam Shingrix Vaccine COVID-19 Vaccine Pneumonia Vaccine Influenza Vaccine HTN 155/82 - 01/09/2022  Star Rating Drugs: Atorvastatin 40 mg last filled 07/29/2021 for 90 day supply at [facility]. Metformin 500 mg ast filled 12/04/2021 for 90 day supply at [facility]. Trulicity 1.5 mg ast filled 11/12/2021 for 90 day supply at [facility].   Everlean Cherry Clinical Pharmacist Assistant [phone]

## 2022-01-16 ENCOUNTER — Telehealth: Payer: Self-pay

## 2022-01-16 ENCOUNTER — Encounter: Payer: Self-pay | Admitting: Family Medicine

## 2022-01-16 NOTE — Telephone Encounter (Signed)
Copied from CRM [id number]. Topic: General - Other >> Jan 16, 2022 10:52 AM Lyman Speller wrote: Reason for CRM: Pt called and would like to speak with Trinna Post / please advise >> Jan 16, 2022 10:55 AM Lyman Speller wrote: He sent a message thru mychart to [place] regarding taking Medrol Dose pack taper / please advise

## 2022-01-20 ENCOUNTER — Other Ambulatory Visit: Payer: Self-pay | Admitting: Family Medicine

## 2022-01-20 ENCOUNTER — Other Ambulatory Visit: Payer: Self-pay | Admitting: Cardiovascular Disease

## 2022-01-20 ENCOUNTER — Ambulatory Visit (INDEPENDENT_AMBULATORY_CARE_PROVIDER_SITE_OTHER): Payer: Medicare PPO

## 2022-01-20 DIAGNOSIS — E1151 Type 2 diabetes mellitus with diabetic peripheral angiopathy without gangrene: Secondary | ICD-10-CM

## 2022-01-20 DIAGNOSIS — I5042 Chronic combined systolic (congestive) and diastolic (congestive) heart failure: Secondary | ICD-10-CM

## 2022-01-20 NOTE — Progress Notes (Signed)
Chronic Care Management Pharmacy Note  02/12/2022 Name:  Jonathon Newman MRN:  [id number] DOB:  [birth year]  Summary: Patient presents for CCM follow-up  Recommendations/Changes made from today's visit: -Recommend STARTING Spironolactone 25 mg daily.  -Recommend STOPPING potassium  Plan: CCM follow-up 3 months  Subjective: Jonathon Newman is an 66 y.o. year old male who is a primary patient of Gwyneth Sprout, [place].  The CCM team was consulted for assistance with disease management and care coordination needs.    Engaged with patient by telephone for follow up visit in response to provider referral for pharmacy case management and/or care coordination services.   Consent to Services:  The patient was given information about Chronic Care Management services, agreed to services, and gave verbal consent prior to initiation of services.  Please see initial visit note for detailed documentation.   Patient Care Team: Gwyneth Sprout, FNP as PCP - General (Family Medicine) Rockey Situ Kathlene November, MD as PCP - Cardiology (Cardiology) Germaine Pomfret, [facility] (Pharmacist)  Recent office visits: 11/04/20: Patient presented to Vernie Murders, PA-C for lumbar radiculopathy  09/27/20: Patient presented to Laverna Peace, [place] for follow-up. A1c worsened to 9.0%  06/28/20: Patient presented to Fenton Malling, PA-C for follow-up. A1c improved to 7.0%. Lyrica increased to 75 mg BID and 150 mg BID after 2 weeks 05/29/20: Patient presented to Fenton Malling, PA-C for follow-up. Patient started on Lyrica for DDD. Patient started on Trulicity 3.88 mg weekly.    Recent consult visits: 11/01/20: Patient presented to Christell Faith, PA-C (Cardiology) for follow-up.   Hospital visits: None in previous 6 months  Objective:  Lab Results  Component Value Date   CREATININE 1.29 (H) 02/10/2022   BUN 30 (H) 02/10/2022   GFRNONAA >60 02/10/2022   GFRAA 72 06/28/2020   NA 141 02/10/2022   K 3.9 02/10/2022    CALCIUM 9.5 02/10/2022   CO2 25 02/10/2022   GLUCOSE 127 (H) 02/10/2022    Lab Results  Component Value Date/Time   HGBA1C 6.0 (H) 11/06/2021 09:48 AM   HGBA1C 6.4 (H) 02/14/2021 03:24 PM   MICROALBUR 50 07/17/2019 07:26 PM    Last diabetic Eye exam: No results found for: "HMDIABEYEEXA"  Last diabetic Foot exam: No results found for: "HMDIABFOOTEX"   Lab Results  Component Value Date   CHOL 106 11/06/2021   HDL 27 (L) 11/06/2021   LDLCALC UNABLE TO CALCULATE IF TRIGLYCERIDE OVER 400 mg/dL 11/06/2021   LDLDIRECT 34.8 11/06/2021   TRIG 441 (H) 11/06/2021   CHOLHDL 3.9 11/06/2021       Latest Ref Rng & Units 01/09/2022   11:29 AM 01/06/2022    8:42 PM 01/01/2022    8:23 AM  Hepatic Function  Total Protein 6.0 - 8.5 g/dL 6.6  7.3  6.7   Albumin 3.9 - 4.9 g/dL 4.3  4.2  4.3   AST 0 - 40 IU/L 44  40  54   ALT 0 - 44 IU/L 78  60  91   Alk Phosphatase 44 - 121 IU/L 88  76  98   Total Bilirubin 0.0 - 1.2 mg/dL 0.4  0.9  0.4     Lab Results  Component Value Date/Time   TSH 1.600 02/02/2018 04:19 PM       Latest Ref Rng & Units 01/06/2022    8:42 PM 05/12/2021   12:47 PM 02/14/2021    3:40 AM  CBC  WBC 4.0 - 10.5 K/uL 6.0  5.8  11.0  Hemoglobin 13.0 - 17.0 g/dL 12.0  12.7  15.1   Hematocrit 39.0 - 52.0 % 34.6  36.9  41.8   Platelets 150 - 400 K/uL 161  176  220     No results found for: "VD25OH"  Clinical ASCVD: Yes  The ASCVD Risk score (Arnett DK, et al., 2019) failed to calculate for the following reasons:   The valid total cholesterol range is 130 to 320 mg/dL       02/10/2022    9:04 AM 01/13/2022    9:44 AM 12/19/2021    8:14 AM  Depression screen PHQ 2/9  Decreased Interest 0 0 0  Down, Depressed, Hopeless 0 0 0  PHQ - 2 Score 0 0 0  Altered sleeping   0  Tired, decreased energy   1  Change in appetite   0  Feeling bad or failure about yourself    0  Trouble concentrating   0  Moving slowly or fidgety/restless   0  Suicidal thoughts   0  PHQ-9 Score   1   Difficult doing work/chores   Not difficult at all    Social History   Tobacco Use  Smoking Status Former   Packs/day: 0.25   Years: 2.00   Total pack years: 0.50   Types: Cigarettes   Quit date: 05/26/2005   Years since quitting: 16.7  Smokeless Tobacco Former   Types: Chew   BP Readings from Last 3 Encounters:  02/11/22 112/80  02/10/22 (!) 165/82  01/23/22 (!) 147/82   Pulse Readings from Last 3 Encounters:  02/11/22 77  02/10/22 75  01/23/22 65   Wt Readings from Last 3 Encounters:  02/11/22 230 lb 12.8 oz (104.7 kg)  02/10/22 232 lb 2 oz (105.3 kg)  01/23/22 227 lb (103 kg)   BMI Readings from Last 3 Encounters:  02/11/22 36.15 kg/m  02/10/22 36.36 kg/m  01/23/22 35.55 kg/m    Assessment/Interventions: Review of patient past medical history, allergies, medications, health status, including review of consultants reports, laboratory and other test data, was performed as part of comprehensive evaluation and provision of chronic care management services.   SDOH:  (Social Determinants of Health) assessments and interventions performed: Yes    SDOH Screenings   Alcohol Screen: Low Risk  (12/19/2021)   Alcohol Screen    Last Alcohol Screening Score (AUDIT): 0  Depression (PHQ2-9): Low Risk  (02/10/2022)   Depression (PHQ2-9)    PHQ-2 Score: 0  Financial Resource Strain: Medium Risk (12/25/2020)   Overall Financial Resource Strain (CARDIA)    Difficulty of Paying Living Expenses: Somewhat hard  Food Insecurity: Not on file  Housing: Not on file  Physical Activity: Not on file  Social Connections: Not on file  Stress: Not on file  Tobacco Use: Medium Risk (02/11/2022)   Patient History    Smoking Tobacco Use: Former    Smokeless Tobacco Use: Former    Passive Exposure: Not on [employer] consultant Needs: No Transportation Needs (07/30/2020)   PRAPARE - Hydrologist (Medical): No    Lack of Transportation (Non-Medical): No     CCM Care Plan  Allergies  Allergen Reactions   Colchicine Other (See Comments)    Palpitations and headache   Lisinopril Cough    Medications Reviewed Today     Reviewed by Sindy Messing (Physician Assistant Certified) on 34/19/37 at [place] List Status: <None>   Medication Order Taking? Sig Documenting Provider  Last Dose Status Informant  acetaminophen (TYLENOL) 500 MG tablet [id number] Yes Take 1,000 mg by mouth every 8 (eight) hours as needed for mild pain or moderate pain. Takes once a day [provider] Taking Active   atorvastatin (LIPITOR) 40 MG tablet [id number] Yes Take 1 tablet (40 mg total) by mouth daily. Minna Merritts, MD Taking Active   Blood Glucose Monitoring Suppl (FREESTYLE LITE) DEVI [id number] Yes To check blood sugar once daily Mar Daring, [place] Taking Active Self  carvedilol (COREG) 25 MG tablet [id number] Yes Take 1 tablet (25 mg total) by mouth 2 (two) times daily with a meal. Gollan, Kathlene November, MD Taking Active   clobetasol (TEMOVATE) 0.05 % external solution [id number] Yes Apply 1 application topically 2 (two) times daily. Tally Joe T, FNP Taking Active   ezetimibe (ZETIA) 10 MG tablet [id number] Yes TAKE ONE TABLET BY MOUTH DAILY Rockey Situ Kathlene November, MD Taking Active   FREESTYLE LITE test strip [id number] Yes USE TO CHECK BLOOD SUGAR ONCE DAILY Gwyneth Sprout, FNP Taking Active   isosorbide mononitrate (IMDUR) 60 MG 24 hr tablet [id number] Yes TAKE 1 TABLET DAILY Gollan, Kathlene November, MD Taking Active   ketoconazole (NIZORAL) 2 % shampoo [id number] Yes Apply 1 application topically 2 (two) times a week. Gwyneth Sprout, FNP Taking Active   Lancets (FREESTYLE) lancets [id number] Yes USE TO CHECK BLOOD SUGAR ONCE DAILY Brita Romp Dionne Bucy, MD Taking Active Self  LOSARTAN POTASSIUM PO [id number] Yes Take 100 mg by mouth daily. [provider] Taking Active   metFORMIN (GLUCOPHAGE-XR) 500 MG 24 hr tablet [id number] Yes TAKE 2 TABLETS  TWICE A DAY WITH MEALS Gwyneth Sprout, FNP Taking Active   methylPREDNISolone (MEDROL DOSEPAK) 4 MG TBPK tablet [id number]  Take by mouth. [provider]  Active   Multiple Vitamin (MULTIVITAMIN WITH MINERALS) TABS tablet [id number] Yes Take 1 tablet by mouth daily. [provider] Taking Active Self  nitroGLYCERIN (NITROSTAT) 0.4 MG SL tablet [id number] No Place 1 tablet (0.4 mg total) under the tongue every 5 (five) minutes as needed for chest pain.  Patient not taking: Reported on 02/10/2022   Alisa Graff, FNP Not Taking Active   pantoprazole (PROTONIX) 40 MG tablet [id number] Yes Take 1 tablet (40 mg total) by mouth daily. Gwyneth Sprout, FNP Taking Active   potassium chloride (KLOR-CON) 10 MEQ tablet [id number] Yes Take 2 tablets (20 mEq total) by mouth 2 (two) times daily. Tally Joe T, FNP Taking Active   pregabalin (LYRICA) 150 MG capsule [id number] Yes TAKE 1 CAPSULE BY MOUTH TWO TIMES A DAY Gwyneth Sprout, FNP Taking Active   senna (SENOKOT) 8.6 MG TABS tablet [id number] Yes Take 1 tablet by mouth daily. [provider] Taking Active Self  torsemide (DEMADEX) 20 MG tablet [id number] Yes 20 mg 7/28 PM/noon; 40 mg 7/29 AM, ongoing 40 mg daily until seen by heart failure team  Patient taking differently: Take 40 mg by mouth daily.   Gwyneth Sprout, FNP Taking Active   TRULICITY 1.5 NI/7.7OE [place] Aid [id number] Yes INJECT 1.5 MG UNDER THE SKIN ONCE A WEEK Gwyneth Sprout, FNP Taking Active             Patient Active Problem List   Diagnosis Date Noted   Swelling of limb 01/23/2022   3+ pitting edema 01/09/2022   AKI (acute kidney injury) ([place]) 01/09/2022   Left hip pain 12/19/2021   Chronic diastolic CHF (congestive heart failure) ([place]) 12/19/2021  Localized edema 12/19/2021   Morton's neuroma of both feet 07/01/2021   DDD (degenerative disc disease), lumbosacral 07/01/2021   Psoriasis of scalp 07/01/2021   Essential hypertension 03/31/2021   Type 2 diabetes  mellitus with diabetic peripheral angiopathy without gangrene, without long-term current use of insulin ([place]) 03/31/2021   History of recent fall 03/31/2021   Foot drop, left 03/31/2021   BMI 33.0-33.9,adult 03/31/2021   Hyponatremia 02/14/2021   Hyperlipidemia associated with type 2 diabetes mellitus ([place]) 12/27/2020   Elevated transaminase level 10/04/2020   Morbid obesity ([place]) 05/20/2020   T2DM (type 2 diabetes mellitus) ([place]) 07/17/2019   Benign paroxysmal positional vertigo due to bilateral vestibular disorder 03/15/2019   Abnormal findings on diagnostic imaging of lung 03/15/2019   Chest pain 03/10/2019   Unstable angina (HCC) 04/25/2018   SOB (shortness of breath) 04/13/2018   Dizziness 04/13/2018   S/P coronary artery bypass graft x 2    Incomplete bladder emptying 07/21/2017   Low back pain 07/09/2017   Lumbar radiculopathy 07/09/2017   Gastroesophageal reflux disease with esophagitis 07/05/2017   Congestive heart failure ([place]) 07/05/2017   Benign prostatic hyperplasia with weak urinary stream 07/05/2017   Dysphagia 07/05/2017   DDD (degenerative disc disease), lumbar 07/05/2017   Hypertension associated with type 2 diabetes mellitus ([place]) 07/31/2015   OSA (obstructive sleep apnea) 07/31/2015    Immunization History  Administered Date(s) Administered   Moderna Sars-Covid-2 Vaccination 11/28/2019, 12/28/2019   Pneumococcal Polysaccharide-23 08/03/2012   Tdap 08/03/2012   Zoster Recombinat (Shingrix) 12/22/2018    Conditions to be addressed/monitored:  Hypertension, Hyperlipidemia, Diabetes, Heart Failure, Coronary Artery Disease, GERD and Gout  Care Plan : General Pharmacy (Adult)  Updates made by Germaine Pomfret, RPH since 02/12/2022 12:00 AM     Problem: Hypertension, Hyperlipidemia, Diabetes, Heart Failure, Coronary Artery Disease, GERD and Gout   Priority: High     Long-Range Goal: Patient-Specific Goal   Start Date: 07/29/2020  Expected End Date:  02/13/2023  This Visit's Progress: On track  Recent Progress: On track  Priority: High  Note:   Current Barriers:  Unable to independently afford treatment regimen  Pharmacist Clinical Goal(s):  Over the next 90 days, patient will verbalize ability to afford treatment regimen through collaboration with PharmD and provider.   Interventions: 1:1 collaboration with Gwyneth Sprout, FNP regarding development and update of comprehensive plan of care as evidenced by provider attestation and co-signature Inter-disciplinary care team collaboration (see longitudinal plan of care) Comprehensive medication review performed; medication list updated in electronic medical record  Heart Failure (Goal: control symptoms and prevent exacerbations) Uncontrolled Type: Diastolic -NYHA Class: II (slight limitation of activity) -Ejection fraction: NA (Date: NA) -Current treatment: Carvedilol 25 mg twice daily  Imdur 60 mg daily  Losartan 100 mg daily  Torsemide 40 mg daily  -Medications previously tried: Amlodipine (Swelling), Hydralazine, Metolazone (AKI), Spironolactone,  -Home blood pressure: 162/80 (8/4). Home meter tends to run ~10 mmHG higher than office BP readings.  -Weighing daily, currently stable around 225. Swelling in legs much improved. Still struggles with dizziness and frequent headaches. Drinks 5-6 20 oz. Water. 1-2 sodas weekly. 1-2 cups of coffee every morning.  -Would likely avoid amlodipine in this patient given recently history of significant LE edema. Likewise, hydralazine may worsen patient's dizziness spells.  -Recommend STARTING Spironolactone 25 mg daily.  -Recommend STOPPING potassium -Continue current medications    Hyperlipidemia: (LDL goal < 70) -Controlled -Current treatment: Atorvastatin 40 mg daily Zetia 10 mg daily  -Medications  previously tried: NA  -Educated on Importance of limiting foods high in cholesterol; -Recommended to continue current  medication  Diabetes (A1c goal <7%) -Controlled -Current medications: Metformin ER 500 mg 2 tablets twice daily  Trulicity 1.5 mg weekly (Started 11/11/20) -Medications previously tried: NA   -Current home glucose readings fasting glucose:  135  post prandial glucose: 162, 187 -Reports hyperglycemic symptoms: dry mouth, fatigue  Continue current medications   Chronic Pain (Goal: Reduce pain and improve quality of life) -Not ideally controlled -Current treatment  Acetaminophen 500 mg 1-2 tablets every 8 hours as needed  Medrol DosePak - Started August 6th for 1 week  Pregabalin 150 mg twice daily  -Medications previously tried: NA  -Utilizes spinal stimulator when hip pain worsens.  -More headaches, fatigues  Continue current medications   Gout (Goal: Prevent gout flares) -Controlled -Current treatment  None -Last gout flare: small flare lasting one day one week prior, treated with one dose of the allopurinol.   -Medications previously tried: Allopurinol (DC by provider)  Counseled patient on low purine diet plan. Counseled patient to reduce consumption of high-fructose corn syrup, sweetened soft drinks, fruit juices, meat, and seafood. Counseled patient to avoid alcohol consumption.  Continue current medications  GERD (Goal: prevent symptoms of heartburn and reflux) -controlled -Current treatment  Pantoprazole 40 mg daily  -Medications previously tried: NA  -Continue current medications  Patient Goals/Self-Care Activities Over the next 90 days, patient will:  - check glucose daily, document, and provide at future appointments check blood pressure if feeling symptoms of low blood pressure, document, and provide at future appointments  Follow Up Plan: Telephone follow up appointment with care management team member scheduled for:  04/17/22 at 8:30 AM    Medication Assistance:  Patient likely does not qualify for many forms of patient assistance due to [employer]  Patient's preferred pharmacy is:  [place], [place] [address] [place] [address] Phone: [phone] Fax: [phone]  [id number]: [phone]: [id number][place]Phone: [phone]: [phone]  Uses pill box? Yes Pt endorses 100% compliance  We discussed: Current pharmacy is preferred with insurance plan and patient is satisfied with pharmacy services Patient decided to: Continue current medication management strategy  Care Plan and Follow Up Patient Decision:  Patient agrees to Care Plan and Follow-up.  Plan: Telephone follow up appointment with care management team member scheduled for:  04/17/22 at 8:30 AM  AJunius Argyle PharmD, BPara March [phone]

## 2022-01-23 ENCOUNTER — Encounter (INDEPENDENT_AMBULATORY_CARE_PROVIDER_SITE_OTHER): Payer: Self-pay | Admitting: Vascular Surgery

## 2022-01-23 ENCOUNTER — Ambulatory Visit (INDEPENDENT_AMBULATORY_CARE_PROVIDER_SITE_OTHER): Payer: Medicare PPO | Admitting: Vascular Surgery

## 2022-01-23 VITALS — BP 147/82 | HR 65 | Resp 17 | Ht 67.0 in | Wt 227.0 lb

## 2022-01-23 DIAGNOSIS — I10 Essential (primary) hypertension: Secondary | ICD-10-CM

## 2022-01-23 DIAGNOSIS — I5042 Chronic combined systolic (congestive) and diastolic (congestive) heart failure: Secondary | ICD-10-CM

## 2022-01-23 DIAGNOSIS — M7989 Other specified soft tissue disorders: Secondary | ICD-10-CM | POA: Insufficient documentation

## 2022-01-23 DIAGNOSIS — I5032 Chronic diastolic (congestive) heart failure: Secondary | ICD-10-CM

## 2022-01-23 DIAGNOSIS — E1151 Type 2 diabetes mellitus with diabetic peripheral angiopathy without gangrene: Secondary | ICD-10-CM | POA: Diagnosis not present

## 2022-01-23 NOTE — Assessment & Plan Note (Signed)
Could certainly contribute to LE swelling. 

## 2022-01-23 NOTE — Patient Instructions (Signed)
Edema ? ?Edema is when you have too much fluid in your body or under your skin. Edema may make your legs, feet, and ankles swell. Swelling often happens in looser tissues, such as around your eyes. This is a common condition. It gets more common as you get older. ?There are many possible causes of edema. These include: ?Eating too much salt (sodium). ?Being on your feet or sitting for a long time. ?Certain medical conditions, such as: ?Pregnancy. ?Heart failure. ?Liver disease. ?Kidney disease. ?Cancer. ?Hot weather may make edema worse. Edema is usually painless. Your skin may look swollen or shiny. ?Follow these instructions at home: ?Medicines ?Take over-the-counter and prescription medicines only as told by your doctor. ?Your doctor may prescribe a medicine to help your body get rid of extra water (diuretic). Take this medicine if you are told to take it. ?Eating and drinking ?Eat a low-salt (low-sodium) diet as told by your doctor. Sometimes, eating less salt may reduce swelling. ?Depending on the cause of your swelling, you may need to limit how much fluid you drink (fluid restriction). ?General instructions ?Raise the injured area above the level of your heart while you are sitting or lying down. ?Do not sit still or stand for a long time. ?Do not wear tight clothes. Do not wear garters on your upper legs. ?Exercise your legs. This can help the swelling go down. ?Wear compression stockings as told by your doctor. It is important that these are the right size. These should be prescribed by your doctor to prevent possible injuries. ?If elastic bandages or wraps are recommended, use them as told by your doctor. ?Contact a doctor if: ?Treatment is not working. ?You have heart, liver, or kidney disease and have symptoms of edema. ?You have sudden and unexplained weight gain. ?Get help right away if: ?You have shortness of breath or chest pain. ?You cannot breathe when you lie down. ?You have pain, redness, or  warmth in the swollen areas. ?You have heart, liver, or kidney disease and get edema all of a sudden. ?You have a fever and your symptoms get worse all of a sudden. ?These symptoms may be an emergency. Get help right away. Call 911. ?Do not wait to see if the symptoms will go away. ?Do not drive yourself to the hospital. ?Summary ?Edema is when you have too much fluid in your body or under your skin. ?Edema may make your legs, feet, and ankles swell. Swelling often happens in looser tissues, such as around your eyes. ?Raise the injured area above the level of your heart while you are sitting or lying down. ?Follow your doctor's instructions about diet and how much fluid you can drink. ?This information is not intended to replace advice given to you by your health care provider. Make sure you discuss any questions you have with your health care provider. ?Document Revised: 02/03/2021 Document Reviewed: 02/03/2021 ?Elsevier Patient Education ? 2023 [employer] ? ?

## 2022-01-23 NOTE — Assessment & Plan Note (Signed)
blood pressure control important in reducing the progression of atherosclerotic disease. On appropriate oral medications.  

## 2022-01-23 NOTE — Assessment & Plan Note (Signed)

## 2022-01-23 NOTE — Progress Notes (Signed)
Patient ID: Jonathon Newman, male   DOB: [birth year], 66 y.o.   MRN: [id number]  Chief Complaint  Patient presents with   Establish Care    Referred by Dr. Rollene Rotunda    HPI Jonathon Newman is a 66 y.o. male.  I am asked to see the patient by Tally Joe for evaluation of leg swelling. The patient reports this to be a problem over several months.  Looking at his history, he developed some renal insufficiency a few months ago and his meds were changed around.  After that improved and he began wearing compression socks and elevating his legs, this got better but has not resolved.  Right leg is worse. Left leg has some swelling. No history of DVT or SVT to his knowledge. No ulcers or weeping.  No fevers or chills.  No chest pain or shortness of breath   Past Medical History:  Diagnosis Date   Anginal pain (HCC)    Arthritis    BPH (benign prostatic hyperplasia)    CHF (congestive heart failure) ([place])    Coronary artery disease 2010   a.) [place] 2010: high grade RCA stenosis -> 2.5 x 5m Cypher DES to RCA. b.) NSTEMI 2016 -> PCI revealed pat RCA stent; sig dLM Dz with FFR ratio 0.76 and oLCx stenosis; ref to CVTS. c.) 2v CABG 04/21/2015; LIMA-LAD, SVG-OM2. [facility] EF 55-65%; LVEDP 14-15 mmHg; 10% p-m RCA, 30% pRCA, 20% dRCA, 95% o-pLCx, 80% m-dLM; LIMA graft pat. SVG graft with mild diffuse Dz.   DDD (degenerative disc disease), lumbar    Dyspnea    GERD (gastroesophageal reflux disease)    Hypercholesteremia    Hypertension    Left foot drop    NSTEMI (non-ST elevated myocardial infarction) ([place] 2016   a.) PCI revealed patent RCA stent; significant dLM disease with FFR ratio 0.76 and oLCx stenosis; refer to CVTS for CABG.   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 LIMA to LAD amd SVG to OM2   CORONARY STENT PLACEMENT  2010   [place] Cypher Sirolimus-eluting stent 2.50 mm x 26 mm placed to the RCA at [place] 08/04/2017   Procedure: ESOPHAGEAL MANOMETRY (EM);  Surgeon: VLin Landsman MD;  Location: [facility] ENDOSCOPY;  Service: Endoscopy;  Laterality: N/A;   FRACTURE SURGERY     KNEE SURGERY     KNEE SURGERY     right knee    LAMINECTOMY     "plate in neck CL8-H9   LEFT HEART CATH AND CORS/GRAFTS ANGIOGRAPHY N/A 05/02/2018   Procedure: CORS/GRAFTS ANGIOGRAPHY;  Surgeon: AWellington Hampshire MD;  [facility];  Service: Cardiovascular;  Laterality: N/A;   RIGHT/LEFT HEART CATH AND CORONARY ANGIOGRAPHY Bilateral 05/02/2018   Procedure: LEFT HEART CATH;  Surgeon: AWellington Hampshire MD;  [facility];  Service: Cardiovascular;  Laterality: Bilateral;   THORACIC LAMINECTOMY FOR  SPINAL CORD STIMULATOR N/A 05/26/2021   Procedure: THORACIC SPINAL CORD STIMULATOR AND PULSE GENERATOR PLACEMENT (MEDTRONIC);  Surgeon: Deetta Perla, MD;  Location: [facility] ORS;  Service: Neurosurgery;  Laterality: N/A;   VASECTOMY       Family History  Problem Relation Age of Onset   Arthritis Mother    Hyperlipidemia Father    Hypertension Father    Heart attack Father 58   Heart murmur Brother    Valvular heart disease Brother    Cataracts Maternal Grandmother    Glaucoma Maternal Grandmother    Cancer Maternal Grandfather    Heart attack Paternal Grandfather    Hypertension Paternal Grandfather    Prostate cancer Neg Hx    Bladder Cancer Neg Hx    Kidney cancer Neg Hx      Social History   Tobacco Use   Smoking status: Former    Packs/day: 0.25    Years: 2.00    Total pack years: 0.50    Types:  Cigarettes    Quit date: 05/26/2005    Years since quitting: 16.6   Smokeless tobacco: Former    Types: Nurse, children's Use: Never used  Substance Use Topics   Alcohol use: No   Drug use: No     Allergies  Allergen Reactions   Colchicine Other (See Comments)    Palpitations and headache   Lisinopril Cough    Current Outpatient Medications  Medication Sig Dispense Refill   acetaminophen (TYLENOL) 500 MG tablet Take 1,000 mg by mouth every 8 (eight) hours as needed for mild pain or moderate pain.     atorvastatin (LIPITOR) 40 MG tablet Take 1 tablet (40 mg total) by mouth daily. 90 tablet 3   Blood Glucose Monitoring Suppl (FREESTYLE LITE) DEVI To check blood sugar once daily 1 each 0   carvedilol (COREG) 25 MG tablet Take 1 tablet (25 mg total) by mouth 2 (two) times daily with a meal. 180 tablet 3   clobetasol (TEMOVATE) 0.05 % external solution Apply 1 application topically 2 (two) times daily. 100 mL 1   ezetimibe (ZETIA) 10 MG tablet TAKE ONE TABLET BY MOUTH DAILY 90 tablet 3   FREESTYLE LITE test strip USE TO CHECK BLOOD SUGAR ONCE DAILY 100 strip 3   isosorbide mononitrate (IMDUR) 60 MG 24 hr tablet TAKE 1 TABLET DAILY 90 tablet 3   ketoconazole (NIZORAL) 2 % shampoo Apply 1 application topically 2 (two) times a week. 120 mL 1   Lancets (FREESTYLE) lancets USE TO CHECK BLOOD SUGAR ONCE DAILY 100 each 4   LOSARTAN POTASSIUM PO Take 100 mg by mouth daily.     metFORMIN (GLUCOPHAGE-XR) 500 MG 24 hr tablet TAKE 2 TABLETS TWICE A DAY WITH MEALS 120 tablet 1   methylPREDNISolone (MEDROL DOSEPAK) 4 MG TBPK tablet Take by mouth.     Multiple Vitamin (MULTIVITAMIN WITH MINERALS) TABS tablet Take 1 tablet by mouth daily.     pantoprazole (PROTONIX) 40 MG tablet Take 1 tablet (40 mg total) by mouth daily. 90 tablet 3   potassium chloride (KLOR-CON) 10 MEQ tablet Take 2 tablets (20 mEq total) by mouth 2 (two) times daily. 360 each 1   pregabalin (LYRICA) 150 MG capsule TAKE 1  CAPSULE BY MOUTH TWO TIMES A DAY 180 capsule 3   senna (SENOKOT) 8.6 MG TABS tablet Take 1 tablet by mouth daily.     torsemide (DEMADEX) 20 MG tablet 20 mg 7/28 PM/noon; 40 mg 7/29  AM, ongoing 40 mg daily until seen by heart failure team (Patient taking differently: Take 40 mg by mouth daily.) 10 tablet 0   TRULICITY 1.5 OA/4.1YS SOPN INJECT 1.5 MG UNDER THE SKIN ONCE A WEEK 6 mL 3   allopurinol (ZYLOPRIM) 300 MG tablet Take 1 tablet (300 mg total) by mouth daily. (Patient not taking: Reported on 01/20/2022) 90 tablet 3   nitroGLYCERIN (NITROSTAT) 0.4 MG SL tablet Place 1 tablet (0.4 mg total) under the tongue every 5 (five) minutes as needed for chest pain. (Patient not taking: Reported on 01/13/2022) 25 tablet 3   No current facility-administered medications for this visit.      REVIEW OF SYSTEMS (Negative unless checked)  Constitutional: '[]' Weight loss  '[]' Fever  '[]' Chills Cardiac: '[]' Chest pain   '[]' Chest pressure   '[]' Palpitations   '[]' Shortness of breath when laying flat   '[]' Shortness of breath at rest   '[]' Shortness of breath with exertion. Vascular:  '[]' Pain in legs with walking   '[]' Pain in legs at rest   '[]' Pain in legs when laying flat   '[]' Claudication   '[]' Pain in feet when walking  '[]' Pain in feet at rest  '[]' Pain in feet when laying flat   '[]' History of DVT   '[]' Phlebitis   '[x]' Swelling in legs   '[]' Varicose veins   '[]' Non-healing ulcers Pulmonary:   '[]' Uses home oxygen   '[]' Productive cough   '[]' Hemoptysis   '[]' Wheeze  '[]' COPD   '[]' Asthma Neurologic:  '[]' Dizziness  '[]' Blackouts   '[]' Seizures   '[]' History of stroke   '[]' History of TIA  '[]' Aphasia   '[]' Temporary blindness   '[]' Dysphagia   '[]' Weakness or numbness in arms   '[]' Weakness or numbness in legs Musculoskeletal:  '[x]' Arthritis   '[]' Joint swelling   '[x]' Joint pain   '[]' Low back pain Hematologic:  '[]' Easy bruising  '[]' Easy bleeding   '[]' Hypercoagulable state   '[]' Anemic  '[]' Hepatitis Gastrointestinal:  '[]' Blood in stool   '[]' Vomiting blood  '[]' Gastroesophageal  reflux/heartburn   '[]' Abdominal pain Genitourinary:  '[x]' Chronic kidney disease   '[]' Difficult urination  '[]' Frequent urination  '[]' Burning with urination   '[]' Hematuria Skin:  '[]' Rashes   '[]' Ulcers   '[]' Wounds Psychological:  '[]' History of anxiety   '[]'  History of major depression.    Physical Exam BP (!) 147/82 (BP Location: Right Arm)   Pulse 65   Resp 17   Ht '5\' 7"'  (1.702 m)   Wt 227 lb (103 kg)   BMI 35.55 kg/m  Gen:  WD/WN, NAD Head: [place]/AT, No temporalis wasting.  Ear/Nose/Throat: Hearing grossly intact, nares w/o erythema or drainage, oropharynx w/o Erythema/Exudate Eyes: Conjunctiva clear, sclera non-icteric  Neck: trachea midline.  No JVD.  Pulmonary:  Good air movement, respirations not labored, no use of accessory muscles  Cardiac: RRR, no JVD Vascular:  Vessel Right Left  Radial Palpable Palpable                                   Gastrointestinal:. No masses, surgical incisions, or scars. Musculoskeletal: M/S 5/5 throughout.  Extremities without ischemic changes.  No deformity or atrophy. 1+ RLE edema, trace LLE edema. Neurologic: Sensation grossly intact in extremities.  Symmetrical.  Speech is fluent. Motor exam as listed above. Psychiatric: Judgment intact, Mood & affect appropriate for pt's clinical situation. Dermatologic: No rashes or ulcers noted.  No cellulitis or open wounds.    Radiology US ABDOMEN LIMITED RUQ (LIVER/GB)  Result Date: 01/09/2022 CLINICAL DATA:  LFT elevation. EXAM:  ULTRASOUND ABDOMEN LIMITED RIGHT UPPER QUADRANT COMPARISON:  None Available. FINDINGS: Gallbladder: Surgically absent. Common bile duct: Diameter: 4 mm Liver: Parenchymal echogenicity is diffusely increased. There are no focal lesions. Portal vein is patent on color Doppler imaging with normal direction of blood flow towards the liver. Other: None. IMPRESSION: 1. Fatty infiltration of the liver. 2. Status post cholecystectomy. 3. No acute findings.  No biliary ductal dilatation.  Electronically Signed   By: Valetta Mole M.D.   On: 01/09/2022 08:03   DG Chest Portable 1 View  Result Date: 01/06/2022 CLINICAL DATA:  Shortness of breath EXAM: PORTABLE CHEST 1 VIEW COMPARISON:  CT 03/27/2019 FINDINGS: Post sternotomy changes with multiple sternal wire fracture. No acute airspace disease, pleural effusion or pneumothorax. Borderline cardiomegaly. Hardware in the upper thoracic spine. IMPRESSION: No active disease. Electronically Signed   By: Donavan Foil M.D.   On: 01/06/2022 21:18    Labs Recent Results (from the past 2160 hour(s))  HgB A1c     Status: Abnormal   Collection Time: 11/06/21  9:48 AM  Result Value Ref Range   Hgb A1c MFr Bld 6.0 (H) 4.8 - 5.6 %    Comment: (NOTE) Pre diabetes:          5.7%-6.4%  Diabetes:              >6.4%  Glycemic control for   <7.0% adults with diabetes    Mean Plasma Glucose 125.5 mg/dL    Comment: Performed at [facility] [address] [place], [place] [address]  Lipid Profile     Status: Abnormal   Collection Time: 11/06/21  9:48 AM  Result Value Ref Range   Cholesterol 106 0 - 200 mg/dL   Triglycerides 441 (H) <150 mg/dL   HDL 27 (L) >40 mg/dL   Total CHOL/HDL Ratio 3.9 RATIO   VLDL UNABLE TO CALCULATE IF TRIGLYCERIDE OVER 400 mg/dL 0 - 40 mg/dL   LDL Cholesterol UNABLE TO CALCULATE IF TRIGLYCERIDE OVER 400 mg/dL 0 - 99 mg/dL    Comment:        Total Cholesterol/HDL:CHD Risk Coronary Heart Disease Risk Table                     Men   Women  1/2 Average Risk   3.4   3.3  Average Risk       5.0   4.4  2 X Average Risk   9.6   7.1  3 X Average Risk  23.4   11.0        Use the calculated Patient Ratio above and the CHD Risk Table to determine the patient's CHD Risk.        ATP III CLASSIFICATION (LDL):  <100     mg/dL   Optimal  100-129  mg/dL   Near or Above                    Optimal  130-159  mg/dL   Borderline  160-189  mg/dL   High  >190     mg/dL   Very High Performed at [place], [place] [address]   Comp Met (CMET)     Status: Abnormal   Collection Time: 11/06/21  9:48 AM  Result Value Ref Range   Sodium 138 135 - 145 mmol/L   Potassium 4.1 3.5 - 5.1 mmol/L   Chloride 104 98 - 111 mmol/L   CO2 23  22 - 32 mmol/L   Glucose, Bld 134 (H) 70 - 99 mg/dL    Comment: Glucose reference range applies only to samples taken after fasting for at least 8 hours.   BUN 26 (H) 8 - 23 mg/dL   Creatinine, Ser 1.44 (H) 0.61 - 1.24 mg/dL   Calcium 9.7 8.9 - 10.3 mg/dL   Total Protein 7.5 6.5 - 8.1 g/dL   Albumin 4.3 3.5 - 5.0 g/dL   AST 44 (H) 15 - 41 U/L   ALT 50 (H) 0 - 44 U/L   Alkaline Phosphatase 83 38 - 126 U/L   Total Bilirubin 0.6 0.3 - 1.2 mg/dL   GFR, Estimated 54 (L) >60 mL/min    Comment: (NOTE) Calculated using the CKD-EPI Creatinine Equation (2021)    Anion gap 11 5 - 15    Comment: Performed at [facility], [address] [place], [place] [address]  LDL cholesterol, direct     Status: None   Collection Time: 11/06/21  9:48 AM  Result Value Ref Range   Direct LDL 34.8 0 - 99 mg/dL    Comment: Performed at [facility] [address] [place], [place] [address]  ECHOCARDIOGRAM COMPLETE     Status: None   Collection Time: 12/11/21 10:21 AM  Result Value Ref Range   AR max vel 1.54 cm2   AV Peak grad 8.4 mmHg   Ao pk vel 1.45 m/s   S' Lateral 3.60 cm   Area-P 1/2 3.87 cm2   AV Area VTI 1.42 cm2   AV Mean grad 5.0 mmHg   Single Plane A4C EF 55.4 %   AV Area mean vel 1.52 cm2  Comprehensive metabolic panel     Status: Abnormal   Collection Time: 12/19/21  9:03 AM  Result Value Ref Range   Glucose 119 (H) 70 - 99 mg/dL   BUN 25 8 - 27 mg/dL   Creatinine, Ser 1.29 (H) 0.76 - 1.27 mg/dL   eGFR 62 >59 mL/min/1.73   BUN/Creatinine Ratio 19 10 - 24   Sodium 139 134 - 144 mmol/L   Potassium 4.0 3.5 - 5.2 mmol/L   Chloride 101 96 - 106 mmol/L   CO2 20 20 - 29 mmol/L   Calcium 9.7 8.6 - 10.2 mg/dL   Total Protein  7.6 6.0 - 8.5 g/dL   Albumin 4.8 3.8 - 4.8 g/dL    Comment:                **Effective December 22, 2021 Albumin reference interval**                  will be changing to:                             Age                  Male          Male                            0 -   7 days       3.6 - 4.9      3.6 - 4.9                            8 -  30 days  3.5 - 4.6      3.5 - 4.6                            1 -   6 months     3.7 - 4.8      3.7 - 4.8                     7 months -   2 years      4.0 - 5.0      4.0 - 5.0                            3 -   5 years      4.1 - 5.0      4.1 - 5.0                            6 -  12 years      4.2 - 5.0      4.2 - 5.0                           13 -  30 years      4.3 - 5.2      4.0 - 5.0                           31 -  50 years      4.1 - 5.1      3.9 - 4.9                           51 -  60 years      3.8 - 4.9      3.8 - 4.9                           61 -  70 years      3.9 - 4.9      3.9 - 4.9                           71 -  80 years      3.8 - 4.8      3.8 - 4.'8                           8 1 ' -  89 years      3.7 - 4.7      3.7 - 4.7                           90 - 199 years      3.6 - 4.6      3.6 - 4.6    Globulin, Total 2.8 1.5 - 4.5 g/dL   Albumin/Globulin Ratio 1.7 1.2 - 2.2   Bilirubin Total 0.4 0.0 - 1.2 mg/dL   Alkaline Phosphatase 101 44 - 121 IU/L   AST 46 (H) 0 - 40 IU/L   ALT 65 (H) 0 - 44 IU/L  Comprehensive Metabolic Panel (CMET)     Status: Abnormal   Collection Time: 12/25/21 10:44 AM  Result Value Ref Range   Glucose 176 (H) 70 - 99 mg/dL   BUN 82 (HH) 8 - 27 mg/dL   Creatinine, Ser 2.62 (H) 0.76 - 1.27 mg/dL   eGFR 26 (L) >59 mL/min/1.73   BUN/Creatinine Ratio 31 (H) 10 - 24   Sodium 136 134 - 144 mmol/L   Potassium 4.2 3.5 - 5.2 mmol/L   Chloride 92 (L) 96 - 106 mmol/L   CO2 19 (L) 20 - 29 mmol/L   Calcium 10.5 (H) 8.6 - 10.2 mg/dL   Total Protein 7.6 6.0 - 8.5 g/dL   Albumin 4.7 3.9 - 4.9 g/dL    Comment:                **Please note reference interval change**   Globulin, Total 2.9 1.5 - 4.5 g/dL   Albumin/Globulin Ratio 1.6 1.2 - 2.2   Bilirubin Total 0.3 0.0 - 1.2 mg/dL   Alkaline Phosphatase 104 44 - 121 IU/L   AST 33 0 - 40 IU/L   ALT 45 (H) 0 - 44 IU/L  Comprehensive Metabolic Panel (CMET)     Status: Abnormal   Collection Time: 01/01/22  8:23 AM  Result Value Ref Range   Glucose 132 (H) 70 - 99 mg/dL   BUN 19 8 - 27 mg/dL   Creatinine, Ser 1.18 0.76 - 1.27 mg/dL   eGFR 68 >59 mL/min/1.73   BUN/Creatinine Ratio 16 10 - 24   Sodium 138 134 - 144 mmol/L   Potassium 4.4 3.5 - 5.2 mmol/L   Chloride 104 96 - 106 mmol/L   CO2 18 (L) 20 - 29 mmol/L   Calcium 9.6 8.6 - 10.2 mg/dL   Total Protein 6.7 6.0 - 8.5 g/dL   Albumin 4.3 3.9 - 4.9 g/dL    Comment:               **Please note reference interval change**   Globulin, Total 2.4 1.5 - 4.5 g/dL   Albumin/Globulin Ratio 1.8 1.2 - 2.2   Bilirubin Total 0.4 0.0 - 1.2 mg/dL   Alkaline Phosphatase 98 44 - 121 IU/L   AST 54 (H) 0 - 40 IU/L   ALT 91 (H) 0 - 44 IU/L  CBC with Differential     Status: Abnormal   Collection Time: 01/06/22  8:42 PM  Result Value Ref Range   WBC 6.0 4.0 - 10.5 K/uL   RBC 3.89 (L) 4.22 - 5.81 MIL/uL   Hemoglobin 12.0 (L) 13.0 - 17.0 g/dL   HCT 34.6 (L) 39.0 - 52.0 %   MCV 88.9 80.0 - 100.0 fL   MCH 30.8 26.0 - 34.0 pg   MCHC 34.7 30.0 - 36.0 g/dL   RDW 14.3 11.5 - 15.5 %   Platelets 161 150 - 400 K/uL   nRBC 0.0 0.0 - 0.2 %   Neutrophils Relative % 56 %   Neutro Abs 3.4 1.7 - 7.7 K/uL   Lymphocytes Relative 27 %   Lymphs Abs 1.6 0.7 - 4.0 K/uL   Monocytes Relative 11 %   Monocytes Absolute 0.6 0.1 - 1.0 K/uL   Eosinophils Relative 5 %   Eosinophils Absolute 0.3 0.0 - 0.5 K/uL   Basophils Relative 1 %   Basophils Absolute 0.1 0.0 - 0.1 K/uL   Immature Granulocytes 0 %   Abs Immature Granulocytes 0.02 0.00 - 0.07 K/uL    Comment: Performed at [facility], [address], [place], [place] [address]   Comprehensive metabolic  panel     Status: Abnormal   Collection Time: 01/06/22  8:42 PM  Result Value Ref Range   Sodium 138 135 - 145 mmol/L   Potassium 4.0 3.5 - 5.1 mmol/L   Chloride 110 98 - 111 mmol/L   CO2 21 (L) 22 - 32 mmol/L   Glucose, Bld 143 (H) 70 - 99 mg/dL    Comment: Glucose reference range applies only to samples taken after fasting for at least 8 hours.   BUN 21 8 - 23 mg/dL   Creatinine, Ser 1.72 (H) 0.61 - 1.24 mg/dL   Calcium 9.3 8.9 - 10.3 mg/dL   Total Protein 7.3 6.5 - 8.1 g/dL   Albumin 4.2 3.5 - 5.0 g/dL   AST 40 15 - 41 U/L   ALT 60 (H) 0 - 44 U/L   Alkaline Phosphatase 76 38 - 126 U/L   Total Bilirubin 0.9 0.3 - 1.2 mg/dL   GFR, Estimated 44 (L) >60 mL/min    Comment: (NOTE) Calculated using the CKD-EPI Creatinine Equation (2021)    Anion gap 7 5 - 15    Comment: Performed at [facility], [address] [place], [place] [address]  Brain natriuretic peptide     Status: None   Collection Time: 01/06/22  8:42 PM  Result Value Ref Range   B Natriuretic Peptide 73.0 0.0 - 100.0 pg/mL    Comment: Performed at [facility], [place], [place] [address]  Troponin I (High Sensitivity)     Status: None   Collection Time: 01/06/22  8:42 PM  Result Value Ref Range   Troponin I (High Sensitivity) 14 <18 ng/L    Comment: (NOTE) Elevated high sensitivity troponin I (hsTnI) values and significant  changes across serial measurements may suggest ACS but many other  chronic and acute conditions are known to elevate hsTnI results.  Refer to the "Links" section for chest pain algorithms and additional  guidance. Performed at [facility], [place], [place] [address]   Troponin I (High Sensitivity)     Status: None   Collection Time: 01/07/22 12:21 AM  Result Value Ref Range   Troponin I (High Sensitivity) 14 <18 ng/L    Comment: (NOTE) Elevated high sensitivity troponin I (hsTnI) values and significant   changes across serial measurements may suggest ACS but many other  chronic and acute conditions are known to elevate hsTnI results.  Refer to the "Links" section for chest pain algorithms and additional  guidance. Performed at [facility], [address] [place], [place] [address]   Comprehensive Metabolic Panel (CMET)     Status: Abnormal   Collection Time: 01/09/22 11:29 AM  Result Value Ref Range   Glucose 143 (H) 70 - 99 mg/dL   BUN 12 8 - 27 mg/dL   Creatinine, Ser 1.14 0.76 - 1.27 mg/dL   eGFR 71 >59 mL/min/1.73   BUN/Creatinine Ratio 11 10 - 24   Sodium 141 134 - 144 mmol/L   Potassium 4.9 3.5 - 5.2 mmol/L   Chloride 109 (H) 96 - 106 mmol/L   CO2 19 (L) 20 - 29 mmol/L   Calcium 9.1 8.6 - 10.2 mg/dL   Total Protein 6.6 6.0 - 8.5 g/dL   Albumin 4.3 3.9 - 4.9 g/dL   Globulin, Total 2.3 1.5 - 4.5 g/dL   Albumin/Globulin Ratio 1.9 1.2 - 2.2   Bilirubin Total 0.4 0.0 - 1.2 mg/dL   Alkaline Phosphatase 88 44 -  121 IU/L   AST 44 (H) 0 - 40 IU/L   ALT 78 (H) 0 - 44 IU/L    Assessment/Plan:  Swelling of limb Recommend:  I have had a long discussion with the patient regarding swelling and why it  causes symptoms.  Patient will begin wearing graduated compression on a daily basis a prescription was given. The patient will  wear the stockings first thing in the morning and removing them in the evening. The patient is instructed specifically not to sleep in the stockings.   In addition, behavioral modification will be initiated.  This will include frequent elevation, use of over the counter pain medications and exercise such as walking.  Consideration for a lymph pump will also be made based upon the effectiveness of conservative therapy.  This would help to improve the edema control and prevent sequela such as ulcers and infections   Patient should undergo duplex ultrasound of the venous system to ensure that DVT or reflux is not present.  The patient will follow-up  with me after the ultrasound.    Essential hypertension blood pressure control important in reducing the progression of atherosclerotic disease. On appropriate oral medications.   Congestive heart failure ([place]) Could certainly contribute to LE swelling  Type 2 diabetes mellitus with diabetic peripheral angiopathy without gangrene, without long-term current use of insulin (HCC) blood glucose control important in reducing the progression of atherosclerotic disease. Also, involved in wound healing. On appropriate medications.      Leotis Pain 01/23/2022, 3:31 PM   This note was created with Dragon medical transcription system.  Any errors from dictation are unintentional.

## 2022-01-23 NOTE — Assessment & Plan Note (Signed)
blood glucose control important in reducing the progression of atherosclerotic disease. Also, involved in wound healing. On appropriate medications.  

## 2022-01-29 ENCOUNTER — Other Ambulatory Visit: Payer: Self-pay | Admitting: Cardiovascular Disease

## 2022-02-10 ENCOUNTER — Other Ambulatory Visit
Admission: RE | Admit: 2022-02-10 | Discharge: 2022-02-10 | Disposition: A | Discharge status: Home / Self Care | Payer: Medicare PPO | Source: Ambulatory Visit | Attending: Family | Admitting: Family

## 2022-02-10 ENCOUNTER — Encounter: Payer: Self-pay | Admitting: Family

## 2022-02-10 ENCOUNTER — Telehealth: Payer: Self-pay

## 2022-02-10 ENCOUNTER — Ambulatory Visit (HOSPITAL_BASED_OUTPATIENT_CLINIC_OR_DEPARTMENT_OTHER): Payer: Medicare PPO | Admitting: Family

## 2022-02-10 VITALS — BP 165/82 | HR 75 | Resp 14 | Ht 67.0 in | Wt 232.1 lb

## 2022-02-10 DIAGNOSIS — E119 Type 2 diabetes mellitus without complications: Secondary | ICD-10-CM

## 2022-02-10 DIAGNOSIS — R42 Dizziness and giddiness: Secondary | ICD-10-CM | POA: Insufficient documentation

## 2022-02-10 DIAGNOSIS — R252 Cramp and spasm: Secondary | ICD-10-CM | POA: Insufficient documentation

## 2022-02-10 DIAGNOSIS — E785 Hyperlipidemia, unspecified: Secondary | ICD-10-CM | POA: Insufficient documentation

## 2022-02-10 DIAGNOSIS — I25118 Atherosclerotic heart disease of native coronary artery with other forms of angina pectoris: Secondary | ICD-10-CM

## 2022-02-10 DIAGNOSIS — R5383 Other fatigue: Secondary | ICD-10-CM | POA: Insufficient documentation

## 2022-02-10 DIAGNOSIS — I10 Essential (primary) hypertension: Secondary | ICD-10-CM | POA: Diagnosis not present

## 2022-02-10 DIAGNOSIS — I2510 Atherosclerotic heart disease of native coronary artery without angina pectoris: Secondary | ICD-10-CM | POA: Insufficient documentation

## 2022-02-10 DIAGNOSIS — Z87891 Personal history of nicotine dependence: Secondary | ICD-10-CM | POA: Insufficient documentation

## 2022-02-10 DIAGNOSIS — I5032 Chronic diastolic (congestive) heart failure: Secondary | ICD-10-CM | POA: Insufficient documentation

## 2022-02-10 DIAGNOSIS — Z7984 Long term (current) use of oral hypoglycemic drugs: Secondary | ICD-10-CM | POA: Insufficient documentation

## 2022-02-10 DIAGNOSIS — Z955 Presence of coronary angioplasty implant and graft: Secondary | ICD-10-CM | POA: Insufficient documentation

## 2022-02-10 DIAGNOSIS — K219 Gastro-esophageal reflux disease without esophagitis: Secondary | ICD-10-CM | POA: Insufficient documentation

## 2022-02-10 DIAGNOSIS — Z794 Long term (current) use of insulin: Secondary | ICD-10-CM | POA: Insufficient documentation

## 2022-02-10 DIAGNOSIS — N400 Enlarged prostate without lower urinary tract symptoms: Secondary | ICD-10-CM | POA: Insufficient documentation

## 2022-02-10 DIAGNOSIS — I110 Hypertensive heart disease with heart failure: Secondary | ICD-10-CM | POA: Insufficient documentation

## 2022-02-10 LAB — BASIC METABOLIC PANEL
Anion gap: 12 (ref 5–15)
BUN: 30 mg/dL — ABNORMAL HIGH (ref 8–23)
CO2: 25 mmol/L (ref 22–32)
Calcium: 9.5 mg/dL (ref 8.9–10.3)
Chloride: 104 mmol/L (ref 98–111)
Creatinine, Ser: 1.29 mg/dL — ABNORMAL HIGH (ref 0.61–1.24)
GFR, Estimated: >60 mL/min (ref >60)
Glucose, Bld: 127 mg/dL — ABNORMAL HIGH (ref 70–99)
Potassium: 3.9 mmol/L (ref 3.5–5.1)
Sodium: 141 mmol/L (ref 135–145)

## 2022-02-10 LAB — MAGNESIUM: Magnesium: 1.7 mg/dL (ref 1.7–2.4)

## 2022-02-10 MED ORDER — NITROGLYCERIN 0.4 MG SL SUBL
0.4000 mg | SUBLINGUAL_TABLET | SUBLINGUAL | 3 refills | Status: DC | PRN
Start: 2022-02-10 — End: 2023-03-30

## 2022-02-10 NOTE — Progress Notes (Signed)
Patient ID: Jonathon Newman, male    DOB: 29-Aug-1955, 66 y.o.   MRN: [id number]  HPI  Mr Ayson is a 66 y/o male with a history of CAD, DM, hyperlipidemia, HTN, BPH, GERD, previous tobacco use and chronic heart failure.   Echo report from 12/11/21 reviewed and showed an EF of 55-60%  LHC done 05/02/18 and showed: The left ventricular systolic function is normal. LV end diastolic pressure is mildly elevated. The left ventricular ejection fraction is 55-65% by visual estimate. Prox RCA to Mid RCA lesion is 10% stenosed. Prox RCA lesion is 30% stenosed. Dist RCA lesion is 20% stenosed. SVG graft was visualized by angiography. The graft exhibits mild diffuse disease. Ost Cx to Prox Cx lesion is 95% stenosed. Mid LM to Dist LM lesion is 80% stenosed. LIMA and is normal in caliber. The graft exhibits no disease.  1. Significant underlying left main and RCA disease with patent grafts including LIMA to LAD and SVG to OM 2.  Patent RCA stent with minimal restenosis. 2.  Normal LV systolic function.  Mildly elevated left ventricular end-diastolic pressure at 14 to 15 mmHg.  Was in the ED 01/07/22 due to HTN, edema and AKI where he was evaluated and released.   He presents today for a follow-up visit with a chief complaint of moderate fatigue with minimal exertion. Describes this as chronic in nature. He has associated chest tightness, shortness of breath, pedal edema, palpitations, dizziness (worsening), headaches, muscle cramps and back pain along with this. He denies any difficulty sleeping, abdominal distention, chest pain, wheezing, cough or difficulty sleeping.   His biggest concern today is of worsening dizziness. He says that it's present "all the time" but worse if he looks up or bends over. He has started using his cane or keeping his hand on the wall to steady himself. He says that the last 2 times it got this bad was right before he had his stent and then again before his bypass so he's  concerned that he may be having more blockages.   Continues to drink too much fluids but says that his mouth stays so dry. Drinking 3.5-5 twenty ounce bottles of water and 2 cups of coffee daily. Using NoSalt for seasoning.  Past Medical History:  Diagnosis Date   Anginal pain (HCC)    Arthritis    BPH (benign prostatic hyperplasia)    CHF (congestive heart failure) (HCC)    Coronary artery disease 2010   a.) LHC 2010: high grade RCA stenosis -> 2.5 x 70mm Cypher DES to RCA. b.) NSTEMI 2016 -> PCI revealed pat RCA stent; sig dLM Dz with FFR ratio 0.76 and oLCx stenosis; ref to CVTS. c.) 2v CABG 04/21/2015; LIMA-LAD, SVG-OM2. d.) [facility] 05/02/2018: EF 55-65%; LVEDP 14-15 mmHg; 10% p-m RCA, 30% pRCA, 20% dRCA, 95% o-pLCx, 80% m-dLM; LIMA graft pat. SVG graft with mild diffuse Dz.   DDD (degenerative disc disease), lumbar    Dyspnea    GERD (gastroesophageal reflux disease)    Hypercholesteremia    Hypertension    Left foot drop    NSTEMI (non-ST elevated myocardial infarction) (HCC) 2016   a.) PCI revealed patent RCA stent; significant dLM disease with FFR ratio 0.76 and oLCx stenosis; refer to CVTS for CABG.   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 LIMA to LAD amd SVG to OM2   CORONARY STENT PLACEMENT  2010   Cordis Cypher Sirolimus-eluting stent 2.50 mm x 26 mm placed to the RCA at [facility]    ESOPHAGEAL MANOMETRY N/A 08/04/2017   Procedure: ESOPHAGEAL MANOMETRY (EM);  Surgeon: Toney Reil, MD;  Location: [facility] ENDOSCOPY;  Service: Endoscopy;  Laterality: N/A;    FRACTURE SURGERY     KNEE SURGERY     KNEE SURGERY     right knee    LAMINECTOMY     "plate in neck F6-O1"   LEFT HEART CATH AND CORS/GRAFTS ANGIOGRAPHY N/A 05/02/2018   Procedure: CORS/GRAFTS ANGIOGRAPHY;  Surgeon: Iran Ouch, MD;  Location: [facility] INVASIVE CV LAB;  Service: Cardiovascular;  Laterality: N/A;   RIGHT/LEFT HEART CATH AND CORONARY ANGIOGRAPHY Bilateral 05/02/2018   Procedure: LEFT HEART CATH;  Surgeon: Iran Ouch, MD;  Location: [facility] INVASIVE CV LAB;  Service: Cardiovascular;  Laterality: Bilateral;   THORACIC LAMINECTOMY FOR SPINAL CORD STIMULATOR N/A 05/26/2021   Procedure: THORACIC SPINAL CORD STIMULATOR AND PULSE GENERATOR PLACEMENT (MEDTRONIC);  Surgeon: Lucy Chris, MD;  Location: [facility] ORS;  Service: Neurosurgery;  Laterality: N/A;   VASECTOMY     Family History  Problem Relation Age of Onset   Arthritis Mother    Hyperlipidemia Father    Hypertension Father    Heart attack Father 42   Heart murmur Brother    Valvular heart disease Brother    Cataracts Maternal Grandmother    Glaucoma Maternal Grandmother    Cancer Maternal Grandfather    Heart attack Paternal Grandfather    Hypertension Paternal Grandfather    Prostate cancer Neg Hx    Bladder Cancer Neg Hx    Kidney cancer Neg Hx    Social History   Tobacco Use   Smoking status: Former    Packs/day: 0.25    Years: 2.00    Total pack years: 0.50    Types: Cigarettes    Quit date: 05/26/2005    Years since quitting: 16.7   Smokeless tobacco: Former    Types: Chew  Substance Use Topics   Alcohol use: No   Allergies  Allergen Reactions   Colchicine Other (See Comments)    Palpitations and headache   Lisinopril Cough   Prior to Admission medications   Medication Sig Start Date End Date Taking? Authorizing Provider  acetaminophen (TYLENOL) 500 MG tablet Take 1,000 mg by mouth every 8 (eight) hours as needed for mild pain or moderate pain. Takes once a day   Yes [provider]  atorvastatin (LIPITOR) 40 MG tablet Take 1 tablet (40 mg total) by mouth daily. 11/05/21  Yes Antonieta Iba, MD  Blood Glucose Monitoring Suppl (FREESTYLE LITE) DEVI To check blood sugar once daily 07/20/19  Yes Margaretann Loveless, PA-C  carvedilol (COREG) 25 MG tablet Take 1 tablet (25 mg total) by mouth 2 (two) times daily with a meal. 11/05/21  Yes Gollan, Tollie Pizza, MD  clobetasol (TEMOVATE) 0.05 % external solution Apply 1 application topically 2 (two) times daily. 07/29/21  Yes Jacky Kindle, FNP  ezetimibe (  ZETIA) 10 MG tablet TAKE ONE TABLET BY MOUTH DAILY 01/12/22  Yes Gollan, Tollie Pizzaimothy J, MD  FREESTYLE LITE test strip USE TO CHECK BLOOD SUGAR ONCE DAILY 01/13/22  Yes Jacky KindlePayne, Elise T, FNP  isosorbide mononitrate (IMDUR) 60 MG 24 hr tablet TAKE 1 TABLET DAILY 01/20/22  Yes Gollan, Tollie Pizzaimothy J, MD  ketoconazole (NIZORAL) 2 % shampoo Apply 1 application topically 2 (two) times a week. 08/11/21  Yes Jacky KindlePayne, Elise T, FNP  Lancets (FREESTYLE) lancets USE TO CHECK BLOOD SUGAR ONCE DAILY 12/23/20  Yes Bacigalupo, Marzella SchleinAngela M, MD  LOSARTAN POTASSIUM PO Take 100 mg by mouth daily.   Yes [provider]  metFORMIN (GLUCOPHAGE-XR) 500 MG 24 hr tablet TAKE 2 TABLETS TWICE A DAY WITH MEALS 01/20/22  Yes Jacky KindlePayne, Elise T, FNP  Multiple Vitamin (MULTIVITAMIN WITH MINERALS) TABS tablet Take 1 tablet by mouth daily.   Yes [provider]  pantoprazole (PROTONIX) 40 MG tablet Take 1 tablet (40 mg total) by mouth daily. 08/04/21  Yes Jacky KindlePayne, Elise T, FNP  potassium chloride (KLOR-CON) 10 MEQ tablet Take 2 tablets (20 mEq total) by mouth 2 (two) times daily. 12/19/21  Yes Jacky KindlePayne, Elise T, FNP  pregabalin (LYRICA) 150 MG capsule TAKE 1 CAPSULE BY MOUTH TWO TIMES A DAY 08/04/21  Yes Jacky KindlePayne, Elise T, FNP  senna (SENOKOT) 8.6 MG TABS tablet Take 1 tablet by mouth daily.   Yes [provider]  torsemide (DEMADEX) 20 MG tablet 20 mg 7/28 PM/noon; 40 mg 7/29 AM, ongoing 40 mg daily until seen by heart failure  team Patient taking differently: Take 40 mg by mouth daily. 01/09/22  Yes Jacky KindlePayne, Elise T, FNP  TRULICITY 1.5 MG/0.5ML SOPN INJECT 1.5 MG UNDER THE SKIN ONCE A WEEK 05/14/21  Yes Merita NortonPayne, Elise T, FNP  methylPREDNISolone (MEDROL DOSEPAK) 4 MG TBPK tablet Take by mouth. 01/16/22   [provider]  nitroGLYCERIN (NITROSTAT) 0.4 MG SL tablet Place 1 tablet (0.4 mg total) under the tongue every 5 (five) minutes as needed for chest pain. Patient not taking: Reported on 02/10/2022 02/10/22   Delma FreezeHackney, Maite Burlison A, FNP   Review of Systems  Constitutional:  Positive for fatigue (tire easily). Negative for appetite change.  HENT:  Negative for congestion, postnasal drip and sore throat.   Eyes: Negative.   Respiratory:  Positive for chest tightness and shortness of breath (at times). Negative for cough and wheezing.   Cardiovascular:  Positive for palpitations (at times) and leg swelling (improving). Negative for chest pain.  Gastrointestinal:  Negative for abdominal distention and abdominal pain.  Endocrine: Negative.   Genitourinary: Negative.   Musculoskeletal:  Positive for back pain (has stimulator implanted). Negative for neck pain.  Skin: Negative.   Allergic/Immunologic: Negative.   Neurological:  Positive for dizziness (worsening), light-headedness and headaches.  Hematological:  Negative for adenopathy. Does not bruise/bleed easily.  Psychiatric/Behavioral:  Negative for dysphoric mood and sleep disturbance (wearing CPAP, sleeping on 2 pillows). The patient is not nervous/anxious.    Vitals:   02/10/22 0852  BP: (!) 165/82  Pulse: 75  Resp: 14  SpO2: 98%  Weight: 232 lb 2 oz (105.3 kg)  Height: 5\' 7"  (1.702 m)   Wt Readings from Last 3 Encounters:  02/10/22 232 lb 2 oz (105.3 kg)  01/23/22 227 lb (103 kg)  01/13/22 230 lb (104.3 kg)   Lab Results  Component Value Date   CREATININE 1.29 (H) 02/10/2022   CREATININE 1.14 01/09/2022   CREATININE 1.72 (H) 01/06/2022  Physical  Exam Vitals and nursing note reviewed.  Constitutional:      Appearance: Normal appearance.  HENT:     Head: Normocephalic and atraumatic.  Cardiovascular:     Rate and Rhythm: Normal rate and regular rhythm.  Pulmonary:     Effort: Pulmonary effort is normal. No respiratory distress.     Breath sounds: No wheezing or rales.  Abdominal:     General: There is no distension.     Palpations: Abdomen is soft.  Musculoskeletal:        General: No tenderness.     Cervical back: Normal range of motion and neck supple.     Right lower leg: Edema (trace pitting) present.     Left lower leg: Edema (trace pitting) present.  Skin:    General: Skin is warm and dry.  Neurological:     General: No focal deficit present.     Mental Status: He is alert and oriented to person, place, and time.  Psychiatric:        Mood and Affect: Mood normal.        Behavior: Behavior normal.        Thought Content: Thought content normal.    Assessment & Plan:  1: Chronic heart failure with preserved ejection fraction without structural changes- - NYHA class III - euvolemic today - weighing daily; reminded to call for an overnight weight gain of > 2 pounds or a weekly weight gain of > 5 pounds - weight up 2 pounds from last visit here 1 month ago - not adding "much" salt and says that he's trying; he is using Mrs Sharilyn [employer], [employer] and has been reading food labels for sodium content - knows to keep daily fluid intake to 64 ounces but says that he's drinking way more than that due to his dry mouth from his diabetes; explained the rationale between his fluid intake, diuretic and edema.  - drinking 70-100 ounces of water and 2 cups of coffee daily - wearing compression socks a few times a week with good results - BNP 01/06/22 was 73.0  2: HTN- - BP elevated today (165/82) - saw PCP Suzie Portela) 01/09/22 - BMP 01/09/22 reviewed and showed sodium 141, potassium 4.9, creatinine 1.14 and GFR 71 - check BMP today - saw  vascular (Dew) 01/23/22  3: DM- - A1c 11/06/21 was 6.0% - this morning at home it was 117  4: CAD- - saw cardiology Mariah Milling) 11/05/21 - says that he hasn't needed NTG in > 1 year but did provide a refill for this today - since worsening dizziness has occurred prior to his stents and his bypass, emphasized that he get a f/u appointment with cardiology - this has been scheduled for tomorrow  5: Muscle cramps- - will check BMP/Mg levels today   Medication bottles reviewed.   Return in 2 months, sooner if needed.

## 2022-02-10 NOTE — Patient Instructions (Signed)
Continue weighing daily and call for an overnight weight gain of 3 pounds or more or a weekly weight gain of more than 5 pounds.   If you have voicemail, please make sure your mailbox is cleaned out so that we may leave a message and please make sure to listen to any voicemails.     

## 2022-02-10 NOTE — Progress Notes (Unsigned)
Cardiology Office Note    Date:  02/11/2022   ID:  Jonathon Newman, DOB 08/12/1955, MRN [id number]  PCP:  Jacky KindlePayne, Elise T, FNP  Cardiologist:  Julien Nordmannimothy Gollan, MD  Electrophysiologist:  None   Chief Complaint: Dizziness  History of Present Illness:   Jonathon Newman is a 66 y.o. male with history of CAD s/p [facility]essel [facility], HFpEF, chronic positional dizziness felt to BPPV, chronic dyspnea, prediabetes, HTN, HLD, obesity, sleep apnea on CPAP, chronic back pain, and prior tobacco abuse who presents for evaluation of dizziness.    He is retired from [employer] though is no longer eligible for his care through the [facility]. He previously underwent Cypher drug-eluting stent to the RCA in 2010. This was followed by a NSTEMI in 2016 with cardiac cath showing a patent RCA stent with significant distal left main disease with an FFR of 0.76 and ostial LCx stenosis. He underwent 2-vessel CABG with a LIMA to LAD and SVG to OM2 in 2016. He was seen in 06/2017 for atypical chest pain and exertional dyspnea. He underwent Lexiscan Myoview on 07/08/2017 that showed no evidence of ischemia with a normal EF. He has required adjustments to his outpatient diuretic regimen periodically.  He underwent diagnostic R/LHC on 05/02/2018 that showed significant underlying left main disease with patent LIMA to LAD and SVG to OM2. The RCA stent was patent with minimal restenosis. He had normal LVSF with a mildly elevated LVEDP at 14-15 mmHg. There was no cardiac culprit for his symptoms based on this cath. Medical therapy was advised.  For intermittent palpitations/dizziness, he underwent outpatient cardiac monitoring that showed NSR with an average heart rate of 72 bpm with a minimum heart rate of 50 bpm, 3 short runs of SVT, no significant arrhythmia to explain the patient's dizziness.  Lexiscan MPI in 10/2019, showed no evidence of ischemia with an EF of 52%.  He underwent back  surgery in 2022 complicated by foot drop and falls status post stimulator.  He was last seen in our office in 10/2021 and was taking an extra 20 mg of torsemide at lunch for lower extremity swelling, in addition to 40 mg twice daily.  Note indicates he was drinking large amounts of water.  When compared to his visit in 04/2021, his weight was up 16 pounds.  Dizziness was also noted at that time.  He was continued on torsemide 40 mg twice daily with an additional 20 mg as needed.  Echo in 11/2021 demonstrated an EF of 55 to 60%, no regional wall motion abnormalities, grade 2 diastolic dysfunction, normal RV systolic function with mildly enlarged ventricular cavity size, trivial aortic insufficiency, and an estimated right atrial pressure of 3 mmHg.  He was seen in the ED in 12/2021 at the recommendation of his PCP for elevated blood pressure and abnormal renal function (serum creatinine had trended to 2.62 with a BUN of 82).  Note indicates his PCP had taken him off his diuretic.  BP was in the 160s systolic.  High-sensitivity troponin normal x2.  BNP 73.  Chest x-ray showed no active disease.  It was noted he has a significant fluctuation in his renal function.  He was advised to follow-up as an outpatient.  [facility] 02[facility]10/2022 noting worsening dizziness that was "present all the time."  Dizziness was worse if he looked up or bent over.  He reported symptoms were similar  to what he experienced leading up to his prior PCI/CABG.  He continues to consume large amounts of liquids.  Documentation indicates he was taking torsemide 40 mg daily.  He comes in today noting a progression of his chronic dizziness.  Symptoms are largely constant and occur regardless of position or positional change.  Symptoms are exacerbated when he lays flat and gazes upwards.  Symptoms are described as "dizziness" and room spinning.  Symptoms feel similar to what he was experiencing leading up to his prior  PCI.  He does note some chest tightness and mild dyspnea at times as well.  No presyncope or syncope.  No palpitations.  Because of his dizziness, he is now ambulating with a cane.  Prior MRI of the brain in 05/2016, 03/2020, and 02/2021 showed no acute intracranial abnormality with findings of mild chronic small vessel ischemia and generalized volume loss noted.  He reports therapies for vertigo have previously been ineffective.  His weight today is down 10 pounds compared to his last clinic visit in 10/2021.   Labs independently reviewed: 01/2022 - potassium 3.9, BUN 30, serum creatinine 1.29, magnesium 1.7 12/2021 - BUN 12, serum creatinine 1.14, potassium 4.9, albumin 4.3, AST 44, ALT 78, Hgb 12.0, PLT 161 10/2021 - direct LDL 34, TC 106, TG 441, HDL 27, A1c 6.0 01/2018 - TSH normal  Past Medical History:  Diagnosis Date   Anginal pain (HCC)    Arthritis    BPH (benign prostatic hyperplasia)    CHF (congestive heart failure) (HCC)    Coronary artery disease 2010   a.) LHC 2010: high grade RCA stenosis -> 2.5 x 75mm Cypher DES to RCA. b.) NSTEMI 2016 -> PCI revealed pat RCA stent; sig dLM Dz with FFR ratio 0.76 and oLCx stenosis; ref to CVTS. c.) 2v CABG 04/21/2015; LIMA-LAD, SVG-OM2. d.) [facility] 05/02/2018: EF 55-65%; LVEDP 14-15 mmHg; 10% p-m RCA, 30% pRCA, 20% dRCA, 95% o-pLCx, 80% m-dLM; LIMA graft pat. SVG graft with mild diffuse Dz.   DDD (degenerative disc disease), lumbar    Dyspnea    GERD (gastroesophageal reflux disease)    Hypercholesteremia    Hypertension    Left foot drop    NSTEMI (non-ST elevated myocardial infarction) (HCC) 2016   a.) PCI revealed patent RCA stent; significant dLM disease with FFR ratio 0.76 and oLCx stenosis; refer to CVTS for CABG.   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 LIMA to LAD amd SVG to OM2   CORONARY STENT PLACEMENT  2010   Cordis Cypher Sirolimus-eluting stent 2.50 mm x 26 mm placed to the RCA at [facility]    ESOPHAGEAL MANOMETRY N/A 08/04/2017   Procedure: ESOPHAGEAL MANOMETRY (EM);  Surgeon: Toney Reil, MD;  Location: [facility] ENDOSCOPY;  Service: Endoscopy;  Laterality: N/A;   FRACTURE SURGERY     KNEE SURGERY     KNEE SURGERY     right knee    LAMINECTOMY     "  plate in neck Z1-I4"   LEFT HEART CATH AND CORS/GRAFTS ANGIOGRAPHY N/A 05/02/2018   Procedure: CORS/GRAFTS ANGIOGRAPHY;  Surgeon: Iran Ouch, MD;  Location: [facility] INVASIVE CV LAB;  Service: Cardiovascular;  Laterality: N/A;   RIGHT/LEFT HEART CATH AND CORONARY ANGIOGRAPHY Bilateral 05/02/2018   Procedure: LEFT HEART CATH;  Surgeon: Iran Ouch, MD;  Location: [facility] INVASIVE CV LAB;  Service: Cardiovascular;  Laterality: Bilateral;   THORACIC LAMINECTOMY FOR SPINAL CORD STIMULATOR N/A 05/26/2021   Procedure: THORACIC SPINAL CORD STIMULATOR AND PULSE GENERATOR PLACEMENT (MEDTRONIC);  Surgeon: Lucy Chris, MD;  Location: [facility] ORS;  Service: Neurosurgery;  Laterality: N/A;   VASECTOMY      Current Medications: Current Meds  Medication Sig   acetaminophen (TYLENOL) 500 MG tablet Take 1,000 mg by mouth every 8 (eight) hours as needed for mild pain or moderate pain. Takes once a day   atorvastatin (LIPITOR) 40 MG tablet Take 1 tablet (40 mg total) by mouth daily.   Blood Glucose Monitoring Suppl (FREESTYLE LITE) DEVI To check blood sugar once daily   carvedilol (COREG) 25 MG tablet Take 1 tablet (25 mg total) by mouth 2 (two) times daily with a meal.   clobetasol (TEMOVATE) 0.05 % external solution Apply 1 application topically 2 (two) times  daily.   ezetimibe (ZETIA) 10 MG tablet TAKE ONE TABLET BY MOUTH DAILY   FREESTYLE LITE test strip USE TO CHECK BLOOD SUGAR ONCE DAILY   isosorbide mononitrate (IMDUR) 60 MG 24 hr tablet TAKE 1 TABLET DAILY   ketoconazole (NIZORAL) 2 % shampoo Apply 1 application topically 2 (two) times a week.   Lancets (FREESTYLE) lancets USE TO CHECK BLOOD SUGAR ONCE DAILY   LOSARTAN POTASSIUM PO Take 100 mg by mouth daily.   meclizine (ANTIVERT) 12.5 MG tablet Take 1 tablet (12.5 mg total) by mouth 3 (three) times daily as needed for dizziness.   metFORMIN (GLUCOPHAGE-XR) 500 MG 24 hr tablet TAKE 2 TABLETS TWICE A DAY WITH MEALS   Multiple Vitamin (MULTIVITAMIN WITH MINERALS) TABS tablet Take 1 tablet by mouth daily.   pantoprazole (PROTONIX) 40 MG tablet Take 1 tablet (40 mg total) by mouth daily.   potassium chloride (KLOR-CON) 10 MEQ tablet Take 2 tablets (20 mEq total) by mouth 2 (two) times daily.   pregabalin (LYRICA) 150 MG capsule TAKE 1 CAPSULE BY MOUTH TWO TIMES A DAY   senna (SENOKOT) 8.6 MG TABS tablet Take 1 tablet by mouth daily.   torsemide (DEMADEX) 20 MG tablet 20 mg 7/28 PM/noon; 40 mg 7/29 AM, ongoing 40 mg daily until seen by heart failure team (Patient taking differently: Take 40 mg by mouth daily.)   TRULICITY 1.5 MG/0.5ML SOPN INJECT 1.5 MG UNDER THE SKIN ONCE A WEEK    Allergies:   Colchicine and Lisinopril   Social History   Socioeconomic History   Marital status: Divorced    Spouse name: Not on file   Number of children: Not on file   Years of education: Not on file   Highest education level: Not on file  Occupational History   Not on file  Tobacco Use   Smoking status: Former    Packs/day: 0.25    Years: 2.00    Total pack years: 0.50    Types: Cigarettes    Quit date: 05/26/2005    Years since quitting: 16.7   Smokeless tobacco: Former    Types: Associate [employer] Use: Never used  Substance and Sexual Activity  Alcohol use: No   Drug use: No    Sexual activity: Not on file  Other Topics Concern   Not on file  Social History Narrative   Lives at home with [name] (friend)    Social Determinants of Health   Financial Resource Strain: Medium Risk (12/25/2020)   Overall Financial Resource Strain (CARDIA)    Difficulty of Paying Living Expenses: Somewhat hard  Food Insecurity: Not on file  Transportation Needs: No Transportation Needs (07/30/2020)   PRAPARE - Administrator, Civil Service (Medical): No    Lack of Transportation (Non-Medical): No  Physical Activity: Not on file  Stress: Not on file  Social Connections: Not on file     Family History:  [name] family history includes Arthritis in his mother; Cancer in his maternal grandfather; Cataracts in his maternal grandmother; Glaucoma in his maternal grandmother; Heart attack in his paternal grandfather; Heart attack (age of onset: 74) in his father; Heart murmur in his brother; Hyperlipidemia in his father; Hypertension in his father and paternal grandfather; Valvular heart disease in his brother. There is no history of Prostate cancer, Bladder Cancer, or Kidney cancer.  ROS:   12-point review of systems is negative unless otherwise noted in HPI.   EKGs/Labs/Other Studies Reviewed:    Studies reviewed were summarized above. The additional studies were reviewed today:  R/LHC 04/2018: The left ventricular systolic function is normal. LV end diastolic pressure is mildly elevated. The left ventricular ejection fraction is 55-65% by visual estimate. Prox RCA to Mid RCA lesion is 10% stenosed. Prox RCA lesion is 30% stenosed. Dist RCA lesion is 20% stenosed. SVG graft was visualized by angiography. The graft exhibits mild diffuse disease. Ost Cx to Prox Cx lesion is 95% stenosed. Mid LM to Dist LM lesion is 80% stenosed. LIMA and is normal in caliber. The graft exhibits no disease.   1. Significant underlying left main and RCA disease with patent grafts  including LIMA to LAD and SVG to OM 2.  Patent RCA stent with minimal restenosis. 2.  Normal LV systolic function.  Mildly elevated left ventricular end-diastolic pressure at 14 to 15 mmHg.   Recommendations: I do not see a culprit for the patient symptoms. Even his LVEDP is only mildly elevated. Continue medical therapy for coronary artery disease and chronic diastolic heart failure. __________   Luci Bank 12/2018: 14-day ZIO patch monitor: Normal sinus rhythm with an average heart rate of 72 bpm.  Minimum heart rate was 50 bpm. 3 short runs of SVT. No significant arrhythmia to explain dizziness. __________   Eugenie Birks MPI 07/2019: Pharmacological myocardial perfusion imaging study with no significant ischemia Small fixed apical defect of mild severity likely secondary to attenuation artifact Normal wall motion, EF estimated at 52% No EKG changes concerning for ischemia at peak stress or in recovery. Low risk scan __________  2D echo 12/11/2021: 1. Left ventricular ejection fraction, by estimation, is 55 to 60%. The  left ventricle has normal function. The left ventricle has no regional  wall motion abnormalities. Left ventricular diastolic parameters are  consistent with Grade II diastolic  dysfunction (pseudonormalization).   2. Right ventricular systolic function is normal. The right ventricular  size is mildly enlarged.   3. The mitral valve is normal in structure. No evidence of mitral valve  regurgitation.   4. The aortic valve is tricuspid. Aortic valve regurgitation is trivial.   5. The inferior vena cava is normal in size with greater than 50%  respiratory variability, suggesting right atrial pressure of 3 mmHg.   Comparison(s): Stress test:Oct 18, 2019 low risk study no significant  ischemia ejection fraction 52%.   EKG:  EKG is ordered today.  The EKG ordered today demonstrates NSR with rare PAC, 77 bpm, significant baseline artifact, no acute ST-T changes.  EKG was not  repeated due to significant dizziness/vertigo in the office today.  Recent Labs: 01/06/2022: B Natriuretic Peptide 73.0; Hemoglobin 12.0; Platelets 161 01/09/2022: ALT 78 02/10/2022: BUN 30; Creatinine, Ser 1.29; Magnesium 1.7; Potassium 3.9; Sodium 141  Recent Lipid Panel    Component Value Date/Time   CHOL 106 11/06/2021 0948   CHOL 146 12/27/2020 0919   TRIG 441 (H) 11/06/2021 0948   HDL 27 (L) 11/06/2021 0948   HDL 25 (L) 12/27/2020 0919   CHOLHDL 3.9 11/06/2021 0948   VLDL UNABLE TO CALCULATE IF TRIGLYCERIDE OVER 400 mg/dL 16/03/9603 5409   LDLCALC UNABLE TO CALCULATE IF TRIGLYCERIDE OVER 400 mg/dL 81/19/1478 2956   LDLCALC 22 12/27/2020 0919   LDLDIRECT 34.8 11/06/2021 0948    PHYSICAL EXAM:    VS:  BP 112/80 (BP Location: Left Arm, Patient Position: Sitting, Cuff Size: Large)   Pulse 77   Ht  (1.702 m)   Wt 230 lb 12.8 oz (104.7 kg)   SpO2 94%   BMI 36.15 kg/m   BMI: Body mass index is 36.15 kg/m.  Physical Exam Vitals reviewed.  Constitutional:      Appearance: He is well-developed.  HENT:     Head: Normocephalic and atraumatic.  Eyes:     General:        Right eye: No discharge.        Left eye: No discharge.  Neck:     Vascular: No JVD.  Cardiovascular:     Rate and Rhythm: Normal rate and regular rhythm.     Pulses:          Posterior tibial pulses are 2+ on the right side and 2+ on the left side.     Heart sounds: Normal heart sounds, S1 normal and S2 normal. Heart sounds not distant. No midsystolic click and no opening snap. No murmur heard.    No friction rub.     Comments: Discomfort was noted along the right side of the neck while auscultating for carotid bruit. Pulmonary:     Effort: Pulmonary effort is normal. No respiratory distress.     Breath sounds: Normal breath sounds. No decreased breath sounds, wheezing or rales.  Chest:     Chest wall: No tenderness.  Abdominal:     General: There is no distension.  Musculoskeletal:     Cervical  back: Normal range of motion.     Right lower leg: No edema.     Left lower leg: No edema.  Skin:    General: Skin is warm and dry.     Nails: There is no clubbing.  Neurological:     Mental Status: He is alert and oriented to person, place, and time.  Psychiatric:        Speech: Speech normal.        Behavior: Behavior normal.        Thought Content: Thought content normal.        Judgment: Judgment normal.     Wt Readings from Last 3 Encounters:  02/11/22 230 lb 12.8 oz (104.7 kg)  02/10/22 232 lb 2 oz (105.3 kg)  01/23/22 227 lb (103 kg)  ASSESSMENT & PLAN:   Chronic dizziness: Longstanding issue, though worse as of late.  Orthostatic vital signs negative in the office today.  He is concerned symptoms are related to his possible anginal equivalent.  Prior cardiac work-up has been unrevealing.  He reports no improvement with symptoms following manipulations for vertigo previously as well.  MRI of the brain has been unrevealing x3 dating back to 2017.  We did discuss potential repeat MRI of the brain given worsening symptoms, though he recently underwent a spinal stimulator that is MR conditional.  Given this, we will pursue head CT without contrast, Zio patch, carotid artery ultrasound, and Lexiscan MPI given possible anginal equivalent and chest tightness.  We will undergo a trial of meclizine 12.5 mg twice daily.  If cardiac work-up above is unrevealing, recommend he follow-up with PCP and/or ENT.  CAD status post CABG: Currently chest pain-free.  Schedule Lexiscan MPI as outlined above.  Otherwise, continue aggressive risk factor modification and secondary prevention including current medications.  HFpEF: He appears euvolemic and well compensated.  Recent BMP showed stable renal function.  Continue current medical therapy.  HTN: Blood pressure is well controlled in the office today.  No changes to medical therapy.  HLD: LDL 34.  He remains on atorvastatin.  Obesity with  sleep apnea: Continue CPAP.  This was not discussed in detail at today's visit.   Shared Decision Making/Informed Consent{  The risks [chest pain, shortness of breath, cardiac arrhythmias, dizziness, blood pressure fluctuations, myocardial infarction, stroke/transient ischemic attack, nausea, vomiting, allergic reaction, radiation exposure, metallic taste sensation and life-threatening complications (estimated to be 1 in 10,000)], benefits (risk stratification, diagnosing coronary artery disease, treatment guidance) and alternatives of a nuclear stress test were discussed in detail with Mr. Bolar and he agrees to proceed.    Disposition: F/u with Dr. Mariah Milling or an APP in 2 months.   Medication Adjustments/Labs and Tests Ordered: Current medicines are reviewed at length with the patient today.  Concerns regarding medicines are outlined above. Medication changes, Labs and Tests ordered today are summarized above and listed in the Patient Instructions accessible in Encounters.   Signed, Eula Listen, PA-C 02/11/2022 9:19 AM     [facility] [place] [address] [place], [place] [address] [phone]

## 2022-02-10 NOTE — Telephone Encounter (Signed)
-----   Message from Delma Freeze, [place] sent at 02/10/2022  1:46 PM EDT ----- Potassium and magnesium are normal so that's not what is causing muscle cramps, please f/u with PCP about this. Kidney function in your normal range.

## 2022-02-11 ENCOUNTER — Ambulatory Visit: Payer: Medicare PPO | Attending: Physician Assistant | Admitting: Physician Assistant

## 2022-02-11 ENCOUNTER — Ambulatory Visit (INDEPENDENT_AMBULATORY_CARE_PROVIDER_SITE_OTHER): Payer: Medicare PPO

## 2022-02-11 ENCOUNTER — Encounter: Payer: Self-pay | Admitting: Physician Assistant

## 2022-02-11 VITALS — BP 112/80 | HR 77 | Ht 67.0 in | Wt 230.8 lb

## 2022-02-11 DIAGNOSIS — R42 Dizziness and giddiness: Secondary | ICD-10-CM

## 2022-02-11 DIAGNOSIS — E669 Obesity, unspecified: Secondary | ICD-10-CM

## 2022-02-11 DIAGNOSIS — I25118 Atherosclerotic heart disease of native coronary artery with other forms of angina pectoris: Secondary | ICD-10-CM | POA: Diagnosis not present

## 2022-02-11 DIAGNOSIS — I2510 Atherosclerotic heart disease of native coronary artery without angina pectoris: Secondary | ICD-10-CM

## 2022-02-11 DIAGNOSIS — I208 Other forms of angina pectoris: Secondary | ICD-10-CM | POA: Diagnosis not present

## 2022-02-11 DIAGNOSIS — I110 Hypertensive heart disease with heart failure: Secondary | ICD-10-CM

## 2022-02-11 DIAGNOSIS — I5032 Chronic diastolic (congestive) heart failure: Secondary | ICD-10-CM | POA: Diagnosis not present

## 2022-02-11 DIAGNOSIS — I10 Essential (primary) hypertension: Secondary | ICD-10-CM

## 2022-02-11 DIAGNOSIS — E785 Hyperlipidemia, unspecified: Secondary | ICD-10-CM

## 2022-02-11 DIAGNOSIS — Z6836 Body mass index (BMI) 36.0-36.9, adult: Secondary | ICD-10-CM

## 2022-02-11 MED ORDER — MECLIZINE HCL 12.5 MG PO TABS: 12.5000 mg | ORAL_TABLET | Freq: Two times a day (BID) | ORAL | 0 refills | Status: DC

## 2022-02-11 MED ORDER — MECLIZINE HCL 12.5 MG PO TABS: 12.5000 mg | ORAL_TABLET | Freq: Three times a day (TID) | ORAL | 0 refills | Status: DC | PRN

## 2022-02-11 NOTE — Addendum Note (Signed)
Addended by: Gibson Ramp on: 02/11/2022 12:01 PM   Modules accepted: Orders

## 2022-02-11 NOTE — Patient Instructions (Signed)
Medication Instructions:   Your physician has recommended you make the following change in your medication:    START taking Meclizine 12.5 MG twice a day.   *If you need a refill on your cardiac medications before your next appointment, please call your pharmacy*   Testing/Procedures:  Your physician has requested that you have a carotid duplex. This test is an ultrasound of the carotid arteries in your neck. It looks at blood flow through these arteries that supply the brain with blood. Allow one hour for this exam. There are no restrictions or special instructions.    2.    [facility] MYOVIEW     Your caregiver has ordered a Stress Test with nuclear imaging. The purpose of this test is to evaluate the blood supply to your heart muscle. This procedure is referred to as a "Non-Invasive Stress Test." This is because other than having an IV started in your vein, nothing is inserted or "invades" your body. Cardiac stress tests are done to find areas of poor blood flow to the heart by determining the extent of coronary artery disease (CAD). Some patients exercise on a treadmill, which naturally increases the blood flow to your heart, while others who are  unable to walk on a treadmill due to physical limitations have a pharmacologic/chemical stress agent called Lexiscan . This medicine will mimic walking on a treadmill by temporarily increasing your coronary blood flow.      PLEASE REPORT TO [facility] MEDICAL MALL ENTRANCE   THE VOLUNTEERS AT THE FIRST DESK WILL DIRECT YOU WHERE TO GO     *Please note: these test may take anywhere between 2-4 hours to complete       Date of Procedure:_____________________________________   Arrival Time for Procedure:______________________________    PLEASE NOTIFY THE OFFICE AT LEAST 24 HOURS IN ADVANCE IF YOU ARE UNABLE TO KEEP YOUR APPOINTMENT.  [phone]  PLEASE NOTIFY NUCLEAR MEDICINE AT [facility] AT LEAST 24 HOURS IN ADVANCE IF YOU ARE UNABLE TO KEEP YOUR  APPOINTMENT. [phone]      How to prepare for your Myoview test:        ____:  Hold diabetes medication the morning of procedure:   metFORMIN (GLUCOPHAGE-XR) 500 MG 24 hr tablet    1. Do not eat or drink after midnight  2. No caffeine for 24 hours prior to test  3. No smoking 24 hours prior to test.  4. Unless instructed otherwise, Take your medication with a small sips of water.    5.         Ladies, please do not wear dresses. Skirts or pants are appropriate. Please wear a short sleeve shirt.  6. No perfume, cologne or lotion.  7. Wear comfortable walking shoes. No heels!    3.  Your caregiver has ordered a CT of the head.   Your cardiac CT will be scheduled at:  [facility] [address] [place], [place] [address] [phone]  Friday 02/13/22 at 8:30 AM   For scheduling needs, including cancellations and rescheduling, please call [place], [phone].    4.    Your physician has recommended that you wear a Zio XT monitor for 2 weeks. This will be mailed to your home address in 4-5 business days.   Your clinician has requested a Zio heart rhythm monitor by [employer] to be mailed to your home for you to wear for 14 days. You should expect a small box to arrive via USPS (or FedEx  in some cases) within this next week. If you do not receive it please call [employer] at [phone].  Closely watching your heart at this time will help your care team understand more and provide information needed to develop your plan of care.  Please apply your Zio patch monitor the day you receive it. Keep this packaging, you will use this to return your Zio monitor.  You will easily be able to apply the monitor with the instructions provided in the Patient Guide.  If you need assistance, [employer] representatives are available 24/7 at [phone].  You can also download the [facility] app on your phone to view detailed application instructions  and log symptoms.  After you wear your monitor for 14 days, place it back in the blue box or envelope, along with your Symptom Log.  To send your monitor back: Simply use the pre-addressed and pre-paid box/envelope.  Send it back through [employer] the same day you remove it via your local post office or by placing it in your mailbox.  As soon as we receive the results, they will be reviewed and your clinician will contact you.  For the first 24 hours- it is essential to not shower or exercise, to allow the patch to adhere to your skin. Avoid excessive sweating to help maximize wear time. Do not submerge the device, no hot tubs, and no swimming pools. Keep any lotions or oils away from the patch. After 24 hours you may shower with the patch on. Take brief showers with your back facing the shower head.  Do not remove patch once it has been placed because that will interrupt data and decrease adhesive wear time. Push the button when you have any symptoms and write down what you were feeling. Once you have completed wearing your monitor, remove and place into box which has postage paid and place in your outgoing mailbox.  If for some reason you have misplaced your box then call our office and we can provide another box and/or mail it off for you.   Follow-Up: At [facility], you and your health needs are our priority.  As part of our continuing mission to provide you with exceptional heart care, we have created designated Provider Care Teams.  These Care Teams include your primary Cardiologist (physician) and Advanced Practice Providers (APPs -  Physician Assistants and Nurse Practitioners) who all work together to provide you with the care you need, when you need it.  We recommend signing up for the patient portal called "MyChart".  Sign up information is provided on this After Visit Summary.  MyChart is used to connect with patients for Virtual Visits (Telemedicine).  Patients are  able to view lab/test results, encounter notes, upcoming appointments, etc.  Non-urgent messages can be sent to your provider as well.   To learn more about what you can do with MyChart, go to [email or web address].    Your next appointment:   2 month(s)  The format for your next appointment:   In Person  Provider:   You may see Julien Nordmann, MD or one of the following Advanced Practice Providers on your designated Care Team:   Nicolasa Ducking, NP Eula Listen, PA-C Cadence Fransico Michael, PA-C Charlsie Quest, NP    Other Instructions   Important Information About Sugar

## 2022-02-12 ENCOUNTER — Telehealth: Payer: Self-pay | Admitting: Cardiovascular Disease

## 2022-02-12 DIAGNOSIS — E1159 Type 2 diabetes mellitus with other circulatory complications: Secondary | ICD-10-CM

## 2022-02-12 DIAGNOSIS — I5030 Unspecified diastolic (congestive) heart failure: Secondary | ICD-10-CM | POA: Diagnosis not present

## 2022-02-12 DIAGNOSIS — E785 Hyperlipidemia, unspecified: Secondary | ICD-10-CM | POA: Diagnosis not present

## 2022-02-12 NOTE — Telephone Encounter (Signed)
*  STAT* If patient is at the pharmacy, call can be transferred to refill team.   1. Which medications need to be refilled? (please list name of each medication and dose if known) atorvastatin (LIPITOR) 40 MG tablet  2. Which pharmacy/location (including street and city if local pharmacy) is medication to be sent to? [facility] - [place], [place] - [address]  3. Do they need a 30 day or 90 day supply? 90 day   

## 2022-02-12 NOTE — Patient Instructions (Signed)
Visit Information It was great speaking with you today!  Please let me know if you have any questions about our visit.  Patient Care Plan: General Pharmacy (Adult)     Problem Identified: Hypertension, Hyperlipidemia, Diabetes, Heart Failure, Coronary Artery Disease, GERD and Gout   Priority: High     Long-Range Goal: Patient-Specific Goal   Start Date: 07/29/2020  Expected End Date: 02/13/2023  This Visit's Progress: On track  Recent Progress: On track  Priority: High  Note:   Current Barriers:  Unable to independently afford treatment regimen  Pharmacist Clinical Goal(s):  Over the next 90 days, patient will verbalize ability to afford treatment regimen through collaboration with PharmD and provider.   Interventions: 1:1 collaboration with Jacky Kindle, FNP regarding development and update of comprehensive plan of care as evidenced by provider attestation and co-signature Inter-disciplinary care team collaboration (see longitudinal plan of care) Comprehensive medication review performed; medication list updated in electronic medical record  Heart Failure (Goal: control symptoms and prevent exacerbations) Uncontrolled Type: Diastolic -NYHA Class: II (slight limitation of activity) -Ejection fraction: NA (Date: NA) -Current treatment: Carvedilol 25 mg twice daily  Imdur 60 mg daily  Losartan 100 mg daily  Torsemide 40 mg daily  -Medications previously tried: Amlodipine (Swelling), Hydralazine, Metolazone (AKI), Spironolactone,  -Home blood pressure: 162/80 (8/4). Home meter tends to run ~10 mmHG higher than office BP readings.  -Weighing daily, currently stable around 225. Swelling in legs much improved. Still struggles with dizziness and frequent headaches. Drinks 5-6 20 oz. Water. 1-2 sodas weekly. 1-2 cups of coffee every morning.  -Would likely avoid amlodipine in this patient given recently history of significant LE edema. Likewise, hydralazine may worsen patient's  dizziness spells.  -Recommend STARTING Spironolactone 25 mg daily.  -Recommend STOPPING potassium -Continue current medications    Hyperlipidemia: (LDL goal < 70) -Controlled -Current treatment: Atorvastatin 40 mg daily Zetia 10 mg daily  -Medications previously tried: NA  -Educated on Importance of limiting foods high in cholesterol; -Recommended to continue current medication  Diabetes (A1c goal <7%) -Controlled -Current medications: Metformin ER 500 mg 2 tablets twice daily  Trulicity 1.5 mg weekly (Started 11/11/20) -Medications previously tried: NA   -Current home glucose readings fasting glucose:  135  post prandial glucose: 162, 187 -Reports hyperglycemic symptoms: dry mouth, fatigue  Continue current medications   Chronic Pain (Goal: Reduce pain and improve quality of life) -Not ideally controlled -Current treatment  Acetaminophen 500 mg 1-2 tablets every 8 hours as needed  Medrol DosePak - Started August 6th for 1 week  Pregabalin 150 mg twice daily  -Medications previously tried: NA  -Utilizes spinal stimulator when hip pain worsens.  -More headaches, fatigues  Continue current medications   Gout (Goal: Prevent gout flares) -Controlled -Current treatment  None -Last gout flare: small flare lasting one day one week prior, treated with one dose of the allopurinol.   -Medications previously tried: Allopurinol (DC by provider)  Counseled patient on low purine diet plan. Counseled patient to reduce consumption of high-fructose corn syrup, sweetened soft drinks, fruit juices, meat, and seafood. Counseled patient to avoid alcohol consumption.  Continue current medications  GERD (Goal: prevent symptoms of heartburn and reflux) -controlled -Current treatment  Pantoprazole 40 mg daily  -Medications previously tried: NA  -Continue current medications  Patient Goals/Self-Care Activities Over the next 90 days, patient will:  - check glucose daily, document, and  provide at future appointments check blood pressure if feeling symptoms of low blood pressure, document,  and provide at future appointments  Follow Up Plan: Telephone follow up appointment with care management team member scheduled for:  04/17/22 at 8:30 AM      Patient agreed to services and verbal consent obtained.   Patient verbalizes understanding of instructions and care plan provided today and agrees to view in MyChart. Active MyChart status and patient understanding of how to access instructions and care plan via MyChart confirmed with patient.     Jonathon Newman, PharmD, Patsy Baltimore, CPP  Clinical Pharmacist Practitioner  [facility] [phone]

## 2022-02-13 ENCOUNTER — Ambulatory Visit: Payer: Medicare PPO

## 2022-02-17 DIAGNOSIS — R42 Dizziness and giddiness: Secondary | ICD-10-CM | POA: Diagnosis not present

## 2022-02-24 ENCOUNTER — Ambulatory Visit: Payer: Medicare PPO

## 2022-02-26 ENCOUNTER — Encounter
Admission: RE | Admit: 2022-02-26 | Discharge: 2022-02-26 | Disposition: A | Discharge status: Home / Self Care | Payer: Medicare PPO | Source: Ambulatory Visit | Attending: Physician Assistant | Admitting: Physician Assistant

## 2022-02-26 DIAGNOSIS — Z951 Presence of aortocoronary bypass graft: Secondary | ICD-10-CM | POA: Diagnosis not present

## 2022-02-26 DIAGNOSIS — R0609 Other forms of dyspnea: Secondary | ICD-10-CM | POA: Diagnosis not present

## 2022-02-26 DIAGNOSIS — R42 Dizziness and giddiness: Secondary | ICD-10-CM | POA: Diagnosis not present

## 2022-02-26 MED ORDER — REGADENOSON 0.4 MG/5ML IV SOLN
0.4000 mg | Freq: Once | INTRAVENOUS | Status: AC
  Administered 2022-02-26: 0.4 mg via INTRAVENOUS

## 2022-02-26 MED ORDER — TECHNETIUM TC 99M TETROFOSMIN IV KIT
10.0000 | PACK | Freq: Once | INTRAVENOUS | Status: AC
  Administered 2022-02-26: 9.8 via INTRAVENOUS

## 2022-02-26 MED ORDER — TECHNETIUM TC 99M TETROFOSMIN IV KIT
31.6400 | PACK | Freq: Once | INTRAVENOUS | Status: AC | PRN
  Administered 2022-02-26: 31.64 via INTRAVENOUS

## 2022-02-27 LAB — NM MYOCAR MULTI W/SPECT W/WALL MOTION / EF
LV dias vol: 74 mL (ref 62–150)
LV sys vol: 28 mL
Nuc Stress EF: 62 %
Peak HR: 83 {beats}/min
Rest HR: 68 {beats}/min
Rest Nuclear Isotope Dose: 9.8 mCi
SDS: 1
SRS: 1
SSS: 1
ST Depression (mm): 0 mm
Stress Nuclear Isotope Dose: 31.6 mCi
TID: 1.06

## 2022-03-04 ENCOUNTER — Ambulatory Visit
Admission: RE | Admit: 2022-03-04 | Discharge: 2022-03-04 | Disposition: A | Discharge status: Home / Self Care | Payer: Medicare PPO | Source: Ambulatory Visit | Attending: Physician Assistant | Admitting: Physician Assistant

## 2022-03-04 DIAGNOSIS — R42 Dizziness and giddiness: Secondary | ICD-10-CM | POA: Insufficient documentation

## 2022-03-11 ENCOUNTER — Telehealth: Payer: Self-pay | Admitting: Cardiovascular Disease

## 2022-03-11 DIAGNOSIS — R42 Dizziness and giddiness: Secondary | ICD-10-CM

## 2022-03-11 NOTE — Telephone Encounter (Signed)
Please inform the patient chronic microvascular changes can contribute to issues with thinking, ambulation/dizziness, and mood.  It is controlled with risk factor modification.  Otherwise, cardiac work-up for his longstanding dizziness has been unrevealing.  If he would like, we could have him evaluated by neurology as well.

## 2022-03-11 NOTE — Telephone Encounter (Signed)
Spoke with pt who is asking to review Brain CT and monitor result.  Pt advised of Ryan Dunn's comments.  Pt has concerns regarding comments in the brain CT as below. Pt is asking if this could be related to early dementia.  Explained to pt contributing factors for microvascular disease are age, hypertension and diabetes and that is important to keep good control of his HTN and diabetes.  With normal test results he remains concerned regarding cause of dizziness.  Pt states he will be leaving to go out of town and would like PA to respond via [place].  BRAIN Patchy and confluent areas of decreased attenuation are noted throughout the deep and periventricular white matter of the cerebral hemispheres bilaterally, compatible with chronic microvascular ischemic disease.

## 2022-03-11 NOTE — Telephone Encounter (Signed)
Called over to [facility] with referral and they will reach out to schedule once they go through their referral process.

## 2022-03-11 NOTE — Telephone Encounter (Signed)
Pt is requesting a call back in regards to labs.

## 2022-03-11 NOTE — Telephone Encounter (Signed)
Reviewed provider response. He verbalized understanding and would like to proceed with neurology referral stating that he needs to find out what is going on and causing his symptoms. Referral placed and advised they would call to get him appointment. He verbalized understanding of our conversation with no further questions at this time.

## 2022-03-13 ENCOUNTER — Encounter: Payer: Self-pay | Admitting: Family Medicine

## 2022-03-16 ENCOUNTER — Telehealth: Payer: Self-pay | Admitting: Podiatry

## 2022-03-16 ENCOUNTER — Encounter: Payer: Self-pay | Admitting: Cardiovascular Disease

## 2022-03-16 NOTE — Telephone Encounter (Signed)
Pt was returning a call to [facility] he thinks about a brace. After asking amanda pt needs to be scheduled to pick up the brace that he was cast for. He asked why it took so long and I did apologize and explain I was not sure but Aaron Edelman the gentleman the molded him is no longer here. I looked for an appt in [place] and there is no openings until November and he is frustrated and I did offer him [place]oro or the option to have christan to call back later this week when the schedule gets opened in [place].,but he said never mind he did not want the brace.

## 2022-03-17 ENCOUNTER — Ambulatory Visit: Payer: Medicare PPO | Attending: Physician Assistant

## 2022-03-17 DIAGNOSIS — R42 Dizziness and giddiness: Secondary | ICD-10-CM

## 2022-03-19 ENCOUNTER — Telehealth: Payer: Self-pay | Admitting: *Deleted

## 2022-03-19 NOTE — Progress Notes (Signed)
Please inform the patient his carotid artery ultrasound was very reassuring with near normal bilateral internal carotid arteries with normal flow along the bilateral vertebral and subclavian arteries.

## 2022-03-19 NOTE — Telephone Encounter (Signed)
Left voicemail message to call back for review of results.  

## 2022-03-19 NOTE — Telephone Encounter (Signed)
-----   Message from Rise Mu, [place] sent at 03/18/2022  8:40 AM EDT ----- Please inform the patient his carotid artery ultrasound was very reassuring with near normal bilateral internal carotid arteries with normal flow along the bilateral vertebral and subclavian arteries.

## 2022-03-24 ENCOUNTER — Ambulatory Visit (INDEPENDENT_AMBULATORY_CARE_PROVIDER_SITE_OTHER): Payer: Medicare PPO

## 2022-03-24 ENCOUNTER — Ambulatory Visit (INDEPENDENT_AMBULATORY_CARE_PROVIDER_SITE_OTHER): Payer: Medicare PPO | Admitting: Vascular Surgery

## 2022-03-24 ENCOUNTER — Encounter (INDEPENDENT_AMBULATORY_CARE_PROVIDER_SITE_OTHER): Payer: Self-pay | Admitting: Vascular Surgery

## 2022-03-24 VITALS — BP 147/79 | HR 78 | Resp 16 | Wt 232.6 lb

## 2022-03-24 DIAGNOSIS — M7989 Other specified soft tissue disorders: Secondary | ICD-10-CM

## 2022-03-24 DIAGNOSIS — E1151 Type 2 diabetes mellitus with diabetic peripheral angiopathy without gangrene: Secondary | ICD-10-CM

## 2022-03-24 DIAGNOSIS — I10 Essential (primary) hypertension: Secondary | ICD-10-CM

## 2022-03-24 DIAGNOSIS — I5042 Chronic combined systolic (congestive) and diastolic (congestive) heart failure: Secondary | ICD-10-CM | POA: Diagnosis not present

## 2022-03-24 NOTE — Assessment & Plan Note (Signed)
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  The right deep venous system had minimal reflux of less than 1 second.  The left great saphenous vein only had a small blip of reflux at the saphenofemoral junction.  No DVT or superficial thrombophlebitis.  Given this finding, he does not have significant venous disease requiring treatment.  He likely has some degree of lymphedema with more swelling on the right after saphenous vein harvest.  We discussed a lymphedema pump but he says he lives with a massage therapist and can get massage on his legs which I think would be a reasonably helpful option.  She continue to wear his compression socks and elevate his legs.  I will see him back as needed.

## 2022-03-24 NOTE — Progress Notes (Signed)
MRN : [id number]  Jonathon Newman is a 66 y.o. (Feb 21, 1956) male who presents with chief complaint of  Chief Complaint  Patient presents with   Follow-up    Ultrasound follow up  .  History of Present Illness: Patient returns today in follow up of his leg swelling.  He says it is about the same.  He says the compression socks really do not help.  He has no ulceration or infection.  No fevers or chills. 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  The right deep venous system had minimal reflux of less than 1 second.  The left great saphenous vein only had a small blip of reflux at the saphenofemoral junction.  No DVT or superficial thrombophlebitis.  Current Outpatient Medications  Medication Sig Dispense Refill   acetaminophen (TYLENOL) 500 MG tablet Take 1,000 mg by mouth every 8 (eight) hours as needed for mild pain or moderate pain. Takes once a day     atorvastatin (LIPITOR) 40 MG tablet Take 1 tablet (40 mg total) by mouth daily. 90 tablet 3   Blood Glucose Monitoring Suppl (FREESTYLE LITE) DEVI To check blood sugar once daily 1 each 0   carvedilol (COREG) 25 MG tablet Take 1 tablet (25 mg total) by mouth 2 (two) times daily with a meal. 180 tablet 3   clobetasol (TEMOVATE) 0.05 % external solution Apply 1 application topically 2 (two) times daily. 100 mL 1   ezetimibe (ZETIA) 10 MG tablet TAKE ONE TABLET BY MOUTH DAILY 90 tablet 3   FREESTYLE LITE test strip USE TO CHECK BLOOD SUGAR ONCE DAILY 100 strip 3   isosorbide mononitrate (IMDUR) 60 MG 24 hr tablet TAKE 1 TABLET DAILY 90 tablet 3   ketoconazole (NIZORAL) 2 % shampoo Apply 1 application topically 2 (two) times a week. 120 mL 1   Lancets (FREESTYLE) lancets USE TO CHECK BLOOD SUGAR ONCE DAILY 100 each 4   LOSARTAN POTASSIUM PO Take 100 mg by mouth daily.     metFORMIN (GLUCOPHAGE-XR) 500 MG 24 hr tablet TAKE 2 TABLETS TWICE A DAY WITH MEALS 120  tablet 1   Multiple Vitamin (MULTIVITAMIN WITH MINERALS) TABS tablet Take 1 tablet by mouth daily.     pantoprazole (PROTONIX) 40 MG tablet Take 1 tablet (40 mg total) by mouth daily. 90 tablet 3   potassium chloride (KLOR-CON) 10 MEQ tablet Take 2 tablets (20 mEq total) by mouth 2 (two) times daily. 360 each 1   pregabalin (LYRICA) 150 MG capsule TAKE 1 CAPSULE BY MOUTH TWO TIMES A DAY 180 capsule 3   senna (SENOKOT) 8.6 MG TABS tablet Take 1 tablet by mouth daily.     torsemide (DEMADEX) 20 MG tablet 20 mg 7/28 PM/noon; 40 mg 7/29 AM, ongoing 40 mg daily until seen by heart failure team (Patient taking differently: Take 40 mg by mouth daily.) 10 tablet 0   TRULICITY 1.5 DJ/2.4QA SOPN INJECT 1.5 MG UNDER THE SKIN ONCE A WEEK 6 mL 3   meclizine (ANTIVERT) 12.5 MG tablet Take 1 tablet (12.5 mg total) by mouth 2 (two) times daily. 30 tablet 0   methylPREDNISolone (MEDROL DOSEPAK) 4 MG TBPK tablet Take by mouth.     nitroGLYCERIN (NITROSTAT) 0.4 MG SL tablet Place 1 tablet (0.4 mg total) under the tongue every 5 (five) minutes as needed for chest pain. (Patient not taking: Reported on 02/10/2022) 25 tablet 3   No current facility-administered  medications for this visit.    Past Medical History:  Diagnosis Date   Anginal pain ([place])    Arthritis    BPH (benign prostatic hyperplasia)    CHF (congestive heart failure) ([place])    Coronary artery disease 2010   a.) LHC 2010: high grade RCA stenosis -> 2.5 x 15m Cypher DES to RCA. b.) NSTEMI 2016 -> PCI revealed pat RCA stent; sig dLM Dz with FFR ratio 0.76 and oLCx stenosis; ref to CVTS. c.) 2v CABG 04/21/2015; LIMA-LAD, SVG-OM2. d.) [facility] EF 55-65%; LVEDP 14-15 mmHg; 10% p-m RCA, 30% pRCA, 20% dRCA, 95% o-pLCx, 80% m-dLM; LIMA graft pat. SVG graft with mild diffuse Dz.   DDD (degenerative disc disease), lumbar    Dyspnea    GERD (gastroesophageal reflux disease)    Hypercholesteremia    Hypertension    Left foot drop    NSTEMI (non-ST  elevated myocardial infarction) [place]   a.) PCI revealed patent RCA stent; significant dLM disease with FFR ratio 0.76 and oLCx stenosis; refer to CVTS for CABG.   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 LIMA to LAD amd SVG to OM2   CORONARY STENT PLACEMENT  2010   Cordis Cypher Sirolimus-eluting stent 2.50 mm x 26 mm placed to the RCA at [place] 08/04/2017   Procedure: ESOPHAGEAL MANOMETRY (EM);  Surgeon: VLin Landsman MD;  Location: [facility];  Service: Endoscopy;  Laterality: N/A;   FRACTURE SURGERY     KNEE SURGERY     KNEE SURGERY     right knee    LAMINECTOMY     "plate in neck CV0-J5   LEFT HEART CATH AND CORS/GRAFTS ANGIOGRAPHY N/A 05/02/2018   Procedure: CORS/GRAFTS ANGIOGRAPHY;  Surgeon: AWellington Hampshire MD;  [facility];  Service: Cardiovascular;  Laterality: N/A;   RIGHT/LEFT HEART CATH AND CORONARY ANGIOGRAPHY Bilateral 05/02/2018   Procedure: LEFT HEART CATH;  Surgeon: AWellington Hampshire MD;  [facility];  Service: Cardiovascular;  Laterality: Bilateral;   THORACIC LAMINECTOMY FOR SPINAL CORD STIMULATOR N/A 05/26/2021   Procedure: THORACIC SPINAL CORD STIMULATOR AND PULSE GENERATOR PLACEMENT (MEDTRONIC);  Surgeon: CDeetta Perla MD;  Location: [facility];  Service: Neurosurgery;  Laterality: N/A;   VASECTOMY       Social History   Tobacco Use   Smoking status: Former    Packs/day: 0.25    Years: 2.00    Total pack years: 0.50    Types: Cigarettes    Quit date: 05/26/2005     Years since quitting: 16.8   Smokeless tobacco: Former    Types: CNurse, children'sUse: Never used  Substance Use Topics   Alcohol use: No   Drug use: No      Family History  Problem Relation Age of Onset   Arthritis Mother    Hyperlipidemia Father  Hypertension Father    Heart attack Father 53   Heart murmur Brother    Valvular heart disease Brother    Cataracts Maternal Grandmother    Glaucoma Maternal Grandmother    Cancer Maternal Grandfather    Heart attack Paternal Grandfather    Hypertension Paternal Grandfather    Prostate cancer Neg Hx    Bladder Cancer Neg Hx    Kidney cancer Neg Hx      Allergies  Allergen Reactions   Colchicine Other (See Comments)    Palpitations and headache   Lisinopril Cough    REVIEW OF SYSTEMS (Negative unless checked)   Constitutional: _0 Weight loss  _1 Fever  _2 Chills Cardiac: _3 Chest pain   _4 Chest pressure   _5 Palpitations   _6 Shortness of breath when laying flat   _7 Shortness of breath at rest   _8 Shortness of breath with exertion. Vascular:  _9 Pain in legs with walking   _10 Pain in legs at rest   _11 Pain in legs when laying flat   _12 Claudication   _13 Pain in feet when walking  _14 Pain in feet at rest  _15 Pain in feet when laying flat   _16 History of DVT   _17 Phlebitis   _18 Swelling in legs   _19 Varicose veins   _20 Non-healing ulcers Pulmonary:   _21 Uses home oxygen   _22 Productive cough   _23 Hemoptysis   _24 Wheeze  _25 COPD   _26 Asthma Neurologic:  _27 Dizziness  _28 Blackouts   _29 Seizures   _30 History of stroke   _31 History of TIA  _32 Aphasia   _33 Temporary blindness   _34 Dysphagia   _35 Weakness or numbness in arms   _36 Weakness or numbness in legs Musculoskeletal:  _37 Arthritis   _38 Joint swelling   _39 Joint pain   _40 Low back pain Hematologic:  _41 Easy bruising  _42 Easy bleeding   _43 Hypercoagulable state   _44 Anemic  _45 Hepatitis Gastrointestinal:  _46 Blood in stool   _47 Vomiting blood  _48 Gastroesophageal reflux/heartburn   _49 Abdominal  pain Genitourinary:  _50 Chronic kidney disease   _51 Difficult urination  _52 Frequent urination  _53 Burning with urination   _54 Hematuria Skin:  _55 Rashes   _56 Ulcers   _57 Wounds Psychological:  _58 History of anxiety   _59  History of major depression.  Physical Examination  BP (!) 147/79 (BP Location: Left Arm)   Pulse 78   Resp 16   Wt 232 lb 9.6 oz (105.5 kg)   BMI 36.43 kg/m  Gen:  WD/WN, NAD Head: [place]/AT, No temporalis wasting. Ear/Nose/Throat: Hearing grossly intact, nares w/o erythema or drainage Eyes: Conjunctiva clear. Sclera non-icteric Neck: Supple.  Trachea midline Pulmonary:  Good air movement, no use of accessory muscles.  Cardiac: RRR, no JVD Vascular:  Vessel Right Left  Radial Palpable Palpable                          PT Palpable Palpable  DP Palpable Palpable   Gastrointestinal: soft, non-tender/non-distended. No guarding/reflex.  Musculoskeletal: M/S 5/5 throughout.  No deformity or atrophy. Mild BLE edema. Neurologic: Sensation grossly intact in extremities.  Symmetrical.  Speech is fluent.  Psychiatric: Judgment intact, Mood & affect appropriate for pt's clinical situation. Dermatologic: No rashes or ulcers noted.  No cellulitis or open wounds.      Labs Recent Results (from the past 2160 hour(s))  Comprehensive Metabolic Panel (CMET)     Status: Abnormal   Collection Time: 12/25/21 10:44 AM  Result Value Ref Range   Glucose 176 (H) 70 - 99 mg/dL   BUN 82 (HH) 8 - 27 mg/dL   Creatinine, Ser 2.62 (H) 0.76 -  1.27 mg/dL   eGFR 26 (L) >59 mL/min/1.73   BUN/Creatinine Ratio 31 (H) 10 - 24   Sodium 136 134 - 144 mmol/L   Potassium 4.2 3.5 - 5.2 mmol/L   Chloride 92 (L) 96 - 106 mmol/L   CO2 19 (L) 20 - 29 mmol/L   Calcium 10.5 (H) 8.6 - 10.2 mg/dL   Total Protein 7.6 6.0 - 8.5 g/dL   Albumin 4.7 3.9 - 4.9 g/dL    Comment:               **Please note reference interval change**   Globulin, Total 2.9 1.5 - 4.5 g/dL   Albumin/Globulin Ratio 1.6 1.2 - 2.2    Bilirubin Total 0.3 0.0 - 1.2 mg/dL   Alkaline Phosphatase 104 44 - 121 IU/L   AST 33 0 - 40 IU/L   ALT 45 (H) 0 - 44 IU/L  Comprehensive Metabolic Panel (CMET)     Status: Abnormal   Collection Time: 01/01/22  8:23 AM  Result Value Ref Range   Glucose 132 (H) 70 - 99 mg/dL   BUN 19 8 - 27 mg/dL   Creatinine, Ser 1.18 0.76 - 1.27 mg/dL   eGFR 68 >59 mL/min/1.73   BUN/Creatinine Ratio 16 10 - 24   Sodium 138 134 - 144 mmol/L   Potassium 4.4 3.5 - 5.2 mmol/L   Chloride 104 96 - 106 mmol/L   CO2 18 (L) 20 - 29 mmol/L   Calcium 9.6 8.6 - 10.2 mg/dL   Total Protein 6.7 6.0 - 8.5 g/dL   Albumin 4.3 3.9 - 4.9 g/dL    Comment:               **Please note reference interval change**   Globulin, Total 2.4 1.5 - 4.5 g/dL   Albumin/Globulin Ratio 1.8 1.2 - 2.2   Bilirubin Total 0.4 0.0 - 1.2 mg/dL   Alkaline Phosphatase 98 44 - 121 IU/L   AST 54 (H) 0 - 40 IU/L   ALT 91 (H) 0 - 44 IU/L  CBC with Differential     Status: Abnormal   Collection Time: 01/06/22  8:42 PM  Result Value Ref Range   WBC 6.0 4.0 - 10.5 K/uL   RBC 3.89 (L) 4.22 - 5.81 MIL/uL   Hemoglobin 12.0 (L) 13.0 - 17.0 g/dL   HCT 34.6 (L) 39.0 - 52.0 %   MCV 88.9 80.0 - 100.0 fL   MCH 30.8 26.0 - 34.0 pg   MCHC 34.7 30.0 - 36.0 g/dL   RDW 14.3 11.5 - 15.5 %   Platelets 161 150 - 400 K/uL   nRBC 0.0 0.0 - 0.2 %   Neutrophils Relative % 56 %   Neutro Abs 3.4 1.7 - 7.7 K/uL   Lymphocytes Relative 27 %   Lymphs Abs 1.6 0.7 - 4.0 K/uL   Monocytes Relative 11 %   Monocytes Absolute 0.6 0.1 - 1.0 K/uL   Eosinophils Relative 5 %   Eosinophils Absolute 0.3 0.0 - 0.5 K/uL   Basophils Relative 1 %   Basophils Absolute 0.1 0.0 - 0.1 K/uL   Immature Granulocytes 0 %   Abs Immature Granulocytes 0.02 0.00 - 0.07 K/uL    Comment: Performed at [facility], [address] [place], [place] [address]  Comprehensive metabolic panel     Status: Abnormal   Collection Time: 01/06/22  8:42 PM  Result Value Ref Range    Sodium 138 135 - 145 mmol/L  Potassium 4.0 3.5 - 5.1 mmol/L   Chloride 110 98 - 111 mmol/L   CO2 21 (L) 22 - 32 mmol/L   Glucose, Bld 143 (H) 70 - 99 mg/dL    Comment: Glucose reference range applies only to samples taken after fasting for at least 8 hours.   BUN 21 8 - 23 mg/dL   Creatinine, Ser 1.72 (H) 0.61 - 1.24 mg/dL   Calcium 9.3 8.9 - 10.3 mg/dL   Total Protein 7.3 6.5 - 8.1 g/dL   Albumin 4.2 3.5 - 5.0 g/dL   AST 40 15 - 41 U/L   ALT 60 (H) 0 - 44 U/L   Alkaline Phosphatase 76 38 - 126 U/L   Total Bilirubin 0.9 0.3 - 1.2 mg/dL   GFR, Estimated 44 (L) >60 mL/min    Comment: (NOTE) Calculated using the CKD-EPI Creatinine Equation (2021)    Anion gap 7 5 - 15    Comment: Performed at [facility], [address] [place], [place] [address]  Brain natriuretic peptide     Status: None   Collection Time: 01/06/22  8:42 PM  Result Value Ref Range   B Natriuretic Peptide 73.0 0.0 - 100.0 pg/mL    Comment: Performed at [facility], [place], [place] [address]  Troponin I (High Sensitivity)     Status: None   Collection Time: 01/06/22  8:42 PM  Result Value Ref Range   Troponin I (High Sensitivity) 14 <18 ng/L    Comment: (NOTE) Elevated high sensitivity troponin I (hsTnI) values and significant  changes across serial measurements may suggest ACS but many other  chronic and acute conditions are known to elevate hsTnI results.  Refer to the "Links" section for chest pain algorithms and additional  guidance. Performed at [facility], [place], [place] [address]   Troponin I (High Sensitivity)     Status: None   Collection Time: 01/07/22 12:21 AM  Result Value Ref Range   Troponin I (High Sensitivity) 14 <18 ng/L    Comment: (NOTE) Elevated high sensitivity troponin I (hsTnI) values and significant  changes across serial measurements may suggest ACS but many other  chronic and acute conditions are known to  elevate hsTnI results.  Refer to the "Links" section for chest pain algorithms and additional  guidance. Performed at [facility], [address] [place],  [address]   Comprehensive Metabolic Panel (CMET)     Status: Abnormal   Collection Time: 01/09/22 11:29 AM  Result Value Ref Range   Glucose 143 (H) 70 - 99 mg/dL   BUN 12 8 - 27 mg/dL   Creatinine, Ser 1.14 0.76 - 1.27 mg/dL   eGFR 71 >59 mL/min/1.73   BUN/Creatinine Ratio 11 10 - 24   Sodium 141 134 - 144 mmol/L   Potassium 4.9 3.5 - 5.2 mmol/L   Chloride 109 (H) 96 - 106 mmol/L   CO2 19 (L) 20 - 29 mmol/L   Calcium 9.1 8.6 - 10.2 mg/dL   Total Protein 6.6 6.0 - 8.5 g/dL   Albumin 4.3 3.9 - 4.9 g/dL   Globulin, Total 2.3 1.5 - 4.5 g/dL   Albumin/Globulin Ratio 1.9 1.2 - 2.2   Bilirubin Total 0.4 0.0 - 1.2 mg/dL   Alkaline Phosphatase 88 44 - 121 IU/L   AST 44 (H) 0 - 40 IU/L   ALT 78 (H) 0 - 44 IU/L  Basic metabolic panel     Status:  Abnormal   Collection Time: 02/10/22  9:33 AM  Result Value Ref Range   Sodium 141 135 - 145 mmol/L   Potassium 3.9 3.5 - 5.1 mmol/L   Chloride 104 98 - 111 mmol/L   CO2 25 22 - 32 mmol/L   Glucose, Bld 127 (H) 70 - 99 mg/dL    Comment: Glucose reference range applies only to samples taken after fasting for at least 8 hours.   BUN 30 (H) 8 - 23 mg/dL   Creatinine, Ser 1.29 (H) 0.61 - 1.24 mg/dL   Calcium 9.5 8.9 - 10.3 mg/dL   GFR, Estimated >60 >60 mL/min    Comment: (NOTE) Calculated using the CKD-EPI Creatinine Equation (2021)    Anion gap 12 5 - 15    Comment: Performed at [facility], [address] [place], [place] [address]  Magnesium     Status: None   Collection Time: 02/10/22  9:33 AM  Result Value Ref Range   Magnesium 1.7 1.7 - 2.4 mg/dL    Comment: Performed at [facility], [address] [place], [place] [address]  NM Myocar Multi W/Spect W/Wall Motion / EF     Status: None   Collection Time: 02/26/22 10:31 AM  Result Value  Ref Range   Rest HR 68.0 bpm   Rest BP 149/83 mmHg   Peak HR 83 bpm   Peak BP 149/83 mmHg   ST Depression (mm) 0 mm   Rest Nuclear Isotope Dose 9.8 mCi   Stress Nuclear Isotope Dose 31.6 mCi   SSS 1.0    SRS 1.0    SDS 1.0    TID 1.06    LV sys vol 28.0 mL   LV dias vol 74.0 62 - 150 mL   Nuc Stress EF 62 %    Radiology VAS US CAROTID  Result Date: 03/20/2022 Carotid Arterial Duplex Study Patient Name:  RAYBURN MUNDIS  Date of Exam:   03/17/2022 Medical Rec #: [id number]        Accession #:    [id number] Date of Birth: 18-Feb-1956         Patient Gender: M Patient Age:   67 years Exam Location:  [place] Procedure:      VAS US CAROTID Referring Phys: Christell Faith --------------------------------------------------------------------------------  Indications:       Excessive, progressing dizziness. Risk Factors:      Hypertension, Diabetes, past history of smoking. Comparison Study:  None Performing Technologist: Pilar Jarvis RDMS, RVT, RDCS  Examination Guidelines: A complete evaluation includes B-mode imaging, spectral Doppler, color Doppler, and power Doppler as needed of all accessible portions of each vessel. Bilateral testing is considered an integral part of a complete examination. Limited examinations for reoccurring indications may be performed as noted.  Right Carotid Findings: +----------+--------+--------+--------+--------------------------+--------+           PSV cm/sEDV cm/sStenosisPlaque Description        Comments +----------+--------+--------+--------+--------------------------+--------+ CCA Prox  51      8                                                  +----------+--------+--------+--------+--------------------------+--------+ CCA Distal79      17                                                 +----------+--------+--------+--------+--------------------------+--------+  ICA Prox  61      14      1-39%   focal, calcific and smooth          +----------+--------+--------+--------+--------------------------+--------+ ICA Mid   51      16      Normal                                     +----------+--------+--------+--------+--------------------------+--------+ ICA Distal44      14                                                 +----------+--------+--------+--------+--------------------------+--------+ ECA       99                                                         +----------+--------+--------+--------+--------------------------+--------+ +----------+--------+-------+----------------+-------------------+           PSV cm/sEDV cmsDescribe        Arm Pressure (mmHG) +----------+--------+-------+----------------+-------------------+ [id number]            Multiphasic, [id number]                 +----------+--------+-------+----------------+-------------------+ +---------+--------+--+--------+--+---------+ VertebralPSV cm/s32EDV cm/s11Antegrade +---------+--------+--+--------+--+---------+  Left Carotid Findings: +----------+--------+--------+--------+---------------------+--------+           PSV cm/sEDV cm/sStenosisPlaque Description   Comments +----------+--------+--------+--------+---------------------+--------+ CCA Prox  79      13                                            +----------+--------+--------+--------+---------------------+--------+ CCA Distal60      13                                            +----------+--------+--------+--------+---------------------+--------+ ICA Prox  41      15      1-39%   hypoechoic and smooth         +----------+--------+--------+--------+---------------------+--------+ ICA Mid   45      18      Normal                                +----------+--------+--------+--------+---------------------+--------+ ICA Distal40      15                                             +----------+--------+--------+--------+---------------------+--------+ ECA       73      11                                            +----------+--------+--------+--------+---------------------+--------+ +----------+--------+--------+----------------+-------------------+           PSV  cm/sEDV cm/sDescribe        Arm Pressure (mmHG) +----------+--------+--------+----------------+-------------------+ Subclavian90      13      Multiphasic, [id number]                 +----------+--------+--------+----------------+-------------------+ +---------+--------+--+--------+--+---------+ VertebralPSV cm/s40EDV cm/s15Antegrade +---------+--------+--+--------+--+---------+   Summary: Right Carotid: The extracranial vessels were near-normal with only minimal wall                thickening or plaque. Left Carotid: The extracranial vessels were near-normal with only minimal wall               thickening or plaque. Vertebrals:  Bilateral vertebral arteries demonstrate antegrade flow. Subclavians: Normal flow hemodynamics were seen in bilateral subclavian              arteries. *See table(s) above for measurements and observations.  Electronically signed by Kathlyn Sacramento MD on 03/20/2022 at 11:49:48 AM.    Final    LONG TERM MONITOR (3-14 DAYS)  Result Date: 03/07/2022 Event monitor Patch Wear Time:  13 days and 19 hours (2023-09-05T11:45:01-399 to 2023-09-19T07:43:03-399) Normal sinus rhythm Patient had a min HR of 48 bpm, max HR of 118 bpm, and avg HR of 73 bpm. 2 Supraventricular Tachycardia runs occurred, the run with the fastest interval lasting 4 beats with a max rate of 118 bpm (avg 101 bpm); the run with the fastest interval was also the longest. Isolated SVEs were frequent (8.0%, [id number]), SVE Couplets were rare (<1.0%, 224), and SVE Triplets were rare (<1.0%, 29). Isolated VEs were rare (<1.0%), and no VE Couplets or VE Triplets were present. Triggered events associated with normal sinus rhythm,  rare PAC.  No significant arrhythmia Signed, Esmond Plants, MD, Ph.D [facility]  CT HEAD WO CONTRAST (5MM)  Result Date: 03/04/2022 CLINICAL DATA:  Dizziness, non-specific dizziness EXAM: CT HEAD WITHOUT CONTRAST TECHNIQUE: Contiguous axial images were obtained from the base of the skull through the vertex without intravenous contrast. RADIATION DOSE REDUCTION: This exam was performed according to the departmental dose-optimization program which includes automated exposure control, adjustment of the mA and/or kV according to patient size and/or use of iterative reconstruction technique. COMPARISON:  MRI head 02/14/2021 BRAIN: BRAIN Patchy and confluent areas of decreased attenuation are noted throughout the deep and periventricular white matter of the cerebral hemispheres bilaterally, compatible with chronic microvascular ischemic disease. No evidence of large-territorial acute infarction. No parenchymal hemorrhage. No mass lesion. No extra-axial collection. No mass effect or midline shift. No hydrocephalus. Basilar cisterns are patent. Vascular: No hyperdense vessel. Skull: No acute fracture or focal lesion. Sinuses/Orbits: Paranasal sinuses and mastoid air cells are clear. The orbits are unremarkable. Other: None. IMPRESSION: No acute intracranial abnormality. Electronically Signed   By: Iven Finn M.D.   On: 03/04/2022 21:27   NM Myocar Multi W/Spect W/Wall Motion / EF  Result Date: 02/27/2022 Pharmacological myocardial perfusion imaging study with no significant  ischemia Small region fixed defect in the inferolateral apical region, unable to exclude prior MI versus attenuation artifact GI uptake artifact noted Normal wall motion, EF estimated at 62% No EKG changes concerning for ischemia at peak stress or in recovery. CT attenuation correction images with three-vessel coronary calcification Low risk scan Signed, Esmond Plants, MD, Ph.D [facility]    Assessment/Plan Essential  hypertension blood pressure control important in reducing the progression of atherosclerotic disease. On appropriate oral medications.     Congestive heart failure ([place]) Could certainly contribute to LE swelling   Type 2  diabetes mellitus with diabetic peripheral angiopathy without gangrene, without long-term current use of insulin (HCC) blood glucose control important in reducing the progression of atherosclerotic disease. Also, involved in wound healing. On appropriate medications.  Swelling of limb Venous duplex today was unrevealing with the right great saphenous vein having been removed for coronary bypass grafting.  The right deep venous system had minimal reflux of less than 1 second.  The left great saphenous vein only had a small blip of reflux at the saphenofemoral junction.  No DVT or superficial thrombophlebitis.  Given this finding, he does not have significant venous disease requiring treatment.  He likely has some degree of lymphedema with more swelling on the right after saphenous vein harvest.  We discussed a lymphedema pump but he says he lives with a massage therapist and can get massage on his legs which I think would be a reasonably helpful option.  She continue to wear his compression socks and elevate his legs.  I will see him back as needed.    Leotis Pain, MD  03/24/2022 9:09 AM    This note was created with Dragon medical transcription system.  Any errors from dictation are purely unintentional

## 2022-03-30 ENCOUNTER — Telehealth: Payer: Self-pay | Admitting: Family Medicine

## 2022-03-30 DIAGNOSIS — E1151 Type 2 diabetes mellitus with diabetic peripheral angiopathy without gangrene: Secondary | ICD-10-CM

## 2022-03-30 MED ORDER — METFORMIN HCL ER 500 MG PO TB24: ORAL_TABLET | ORAL | 1 refills | Status: DC

## 2022-03-30 NOTE — Telephone Encounter (Signed)
[facility] requesting New RX of  Metformin HCL ER (Osmotic) Tab 500MG   90 day supply

## 2022-04-07 ENCOUNTER — Encounter: Payer: Self-pay | Admitting: Family

## 2022-04-07 ENCOUNTER — Ambulatory Visit: Payer: Medicare PPO | Attending: Family | Admitting: Family

## 2022-04-07 VITALS — BP 151/83 | HR 73 | Resp 20 | Ht 67.0 in | Wt 233.2 lb

## 2022-04-07 DIAGNOSIS — I5032 Chronic diastolic (congestive) heart failure: Secondary | ICD-10-CM | POA: Insufficient documentation

## 2022-04-07 DIAGNOSIS — K219 Gastro-esophageal reflux disease without esophagitis: Secondary | ICD-10-CM | POA: Insufficient documentation

## 2022-04-07 DIAGNOSIS — Z7984 Long term (current) use of oral hypoglycemic drugs: Secondary | ICD-10-CM | POA: Diagnosis not present

## 2022-04-07 DIAGNOSIS — E119 Type 2 diabetes mellitus without complications: Secondary | ICD-10-CM | POA: Diagnosis not present

## 2022-04-07 DIAGNOSIS — Z87891 Personal history of nicotine dependence: Secondary | ICD-10-CM | POA: Insufficient documentation

## 2022-04-07 DIAGNOSIS — I10 Essential (primary) hypertension: Secondary | ICD-10-CM

## 2022-04-07 DIAGNOSIS — M549 Dorsalgia, unspecified: Secondary | ICD-10-CM | POA: Diagnosis not present

## 2022-04-07 DIAGNOSIS — I110 Hypertensive heart disease with heart failure: Secondary | ICD-10-CM | POA: Insufficient documentation

## 2022-04-07 DIAGNOSIS — I2510 Atherosclerotic heart disease of native coronary artery without angina pectoris: Secondary | ICD-10-CM | POA: Diagnosis not present

## 2022-04-07 DIAGNOSIS — Z794 Long term (current) use of insulin: Secondary | ICD-10-CM | POA: Insufficient documentation

## 2022-04-07 DIAGNOSIS — R531 Weakness: Secondary | ICD-10-CM | POA: Diagnosis not present

## 2022-04-07 DIAGNOSIS — R42 Dizziness and giddiness: Secondary | ICD-10-CM | POA: Diagnosis not present

## 2022-04-07 DIAGNOSIS — R0789 Other chest pain: Secondary | ICD-10-CM | POA: Diagnosis not present

## 2022-04-07 DIAGNOSIS — G8929 Other chronic pain: Secondary | ICD-10-CM | POA: Insufficient documentation

## 2022-04-07 DIAGNOSIS — R002 Palpitations: Secondary | ICD-10-CM | POA: Diagnosis not present

## 2022-04-07 DIAGNOSIS — R0602 Shortness of breath: Secondary | ICD-10-CM | POA: Diagnosis not present

## 2022-04-07 DIAGNOSIS — I890 Lymphedema, not elsewhere classified: Secondary | ICD-10-CM | POA: Insufficient documentation

## 2022-04-07 DIAGNOSIS — E785 Hyperlipidemia, unspecified: Secondary | ICD-10-CM | POA: Insufficient documentation

## 2022-04-07 DIAGNOSIS — R5383 Other fatigue: Secondary | ICD-10-CM | POA: Insufficient documentation

## 2022-04-07 NOTE — Patient Instructions (Signed)
Continue weighing daily and call for an overnight weight gain of 3 pounds or more or a weekly weight gain of more than 5 pounds.   If you have voicemail, please make sure your mailbox is cleaned out so that we may leave a message and please make sure to listen to any voicemails.     

## 2022-04-07 NOTE — Progress Notes (Signed)
Patient ID: Jonathon Newman, male    DOB: 12-01-1955, 66 y.o.   MRN: [id number]  HPI  Jonathon Newman is a 66 y/o male with a history of CAD, DM, hyperlipidemia, HTN, BPH, GERD, previous tobacco use and chronic heart failure.   Echo report from 12/11/21 reviewed and showed an EF of 55-60%  LHC done 05/02/18 and showed: The left ventricular systolic function is normal. LV end diastolic pressure is mildly elevated. The left ventricular ejection fraction is 55-65% by visual estimate. Prox RCA to Mid RCA lesion is 10% stenosed. Prox RCA lesion is 30% stenosed. Dist RCA lesion is 20% stenosed. SVG graft was visualized by angiography. The graft exhibits mild diffuse disease. Ost Cx to Prox Cx lesion is 95% stenosed. Mid LM to Dist LM lesion is 80% stenosed. LIMA and is normal in caliber. The graft exhibits no disease.  1. Significant underlying left main and RCA disease with patent grafts including LIMA to LAD and SVG to OM 2.  Patent RCA stent with minimal restenosis. 2.  Normal LV systolic function.  Mildly elevated left ventricular end-diastolic pressure at 14 to 15 mmHg.  Was in the ED 01/07/22 due to HTN, edema and AKI where he was evaluated and released.   He presents today for a follow-up visit with a chief complaint of moderate fatigue with minimal exertion. Describes this as chronic in nature having been present for several years. He has associated chest tightness, shortness of breath, pedal edema, palpitations, chronic back pain, dizziness and leg weakness along with this. He denies any difficulty sleeping, abdominal distention, chest pain, wheezing, cough or weight gain.   Continues to drink too much fluids but says that his mouth stays so dry. Drinking 3.5-5 twenty ounce bottles of water and 2 cups of coffee daily. Using NoSalt for seasoning.  Past Medical History:  Diagnosis Date   Anginal pain ([place])    Arthritis    BPH (benign prostatic hyperplasia)    CHF (congestive heart  failure) ([place])    Coronary artery disease 2010   a.) LHC 2010: high grade RCA stenosis -> 2.5 x 50mm Cypher DES to RCA. b.) NSTEMI 2016 -> PCI revealed pat RCA stent; sig dLM Dz with FFR ratio 0.76 and oLCx stenosis; ref to CVTS. c.) 2v CABG 04/21/2015; LIMA-LAD, SVG-OM2. d.) [facility] 05/02/2018: EF 55-65%; LVEDP 14-15 mmHg; 10% p-m RCA, 30% pRCA, 20% dRCA, 95% o-pLCx, 80% m-dLM; LIMA graft pat. SVG graft with mild diffuse Dz.   DDD (degenerative disc disease), lumbar    Dyspnea    GERD (gastroesophageal reflux disease)    Hypercholesteremia    Hypertension    Left foot drop    NSTEMI (non-ST elevated myocardial infarction) ([place]) 2016   a.) PCI revealed patent RCA stent; significant dLM disease with FFR ratio 0.76 and oLCx stenosis; refer to CVTS for CABG.   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 LIMA to LAD amd SVG to OM2  CORONARY STENT PLACEMENT  2010   Cordis Cypher Sirolimus-eluting stent 2.50 mm x 26 mm placed to the RCA at [place] N/A 08/04/2017   Procedure: ESOPHAGEAL MANOMETRY (EM);  Surgeon: Lin Landsman, MD;  Location: [facility] ENDOSCOPY;  Service: Endoscopy;  Laterality: N/A;   FRACTURE SURGERY     KNEE SURGERY     KNEE SURGERY     right knee    LAMINECTOMY     "plate in neck [id number]"   LEFT HEART CATH AND CORS/GRAFTS ANGIOGRAPHY N/A 05/02/2018   Procedure: CORS/GRAFTS ANGIOGRAPHY;  Surgeon: Wellington Hampshire, MD;  Location: [place] [facility];  Service: Cardiovascular;  Laterality: N/A;   RIGHT/LEFT HEART CATH AND CORONARY ANGIOGRAPHY  Bilateral 05/02/2018   Procedure: LEFT HEART CATH;  Surgeon: Wellington Hampshire, MD;  Location: [place] [facility];  Service: Cardiovascular;  Laterality: Bilateral;   THORACIC LAMINECTOMY FOR SPINAL CORD STIMULATOR N/A 05/26/2021   Procedure: THORACIC SPINAL CORD STIMULATOR AND PULSE GENERATOR PLACEMENT (MEDTRONIC);  Surgeon: Deetta Perla, MD;  Location: [facility] ORS;  Service: Neurosurgery;  Laterality: N/A;   VASECTOMY     Family History  Problem Relation Age of Onset   Arthritis Mother    Hyperlipidemia Father    Hypertension Father    Heart attack Father 68   Heart murmur Brother    Valvular heart disease Brother    Cataracts Maternal Grandmother    Glaucoma Maternal Grandmother    Cancer Maternal Grandfather    Heart attack Paternal Grandfather    Hypertension Paternal Grandfather    Prostate cancer Neg Hx    Bladder Cancer Neg Hx    Kidney cancer Neg Hx    Social History   Tobacco Use   Smoking status: Former    Packs/day: 0.25    Years: 2.00    Total pack years: 0.50    Types: Cigarettes    Quit date: 05/26/2005    Years since quitting: 16.8   Smokeless tobacco: Former    Types: Chew  Substance Use Topics   Alcohol use: No   Allergies  Allergen Reactions   Colchicine Other (See Comments)    Palpitations and headache   Lisinopril Cough   Prior to Admission medications   Medication Sig Start Date End Date Taking? Authorizing Provider  acetaminophen (TYLENOL) 500 MG tablet Take 1,000 mg by mouth every 8 (eight) hours as needed for mild pain or moderate pain. Takes once a day   Yes [provider]  atorvastatin (LIPITOR) 40 MG tablet Take 1 tablet (40 mg total) by mouth daily. 11/05/21  Yes Minna Merritts, MD  Blood Glucose Monitoring Suppl (FREESTYLE LITE) DEVI To check blood sugar once daily 07/20/19  Yes Mar Daring, PA-C  carvedilol (COREG) 25 MG tablet Take 1 tablet (25 mg total) by mouth 2 (two) times daily with a meal. 11/05/21  Yes Gollan,  Kathlene November, MD  clobetasol (TEMOVATE) 0.05 % external solution Apply 1 application topically 2 (two) times daily. 07/29/21  Yes Tally Joe T, FNP  ezetimibe (ZETIA) 10 MG tablet TAKE ONE TABLET BY MOUTH DAILY 01/12/22  Yes Minna Merritts, MD  FREESTYLE LITE test strip USE TO CHECK BLOOD SUGAR ONCE DAILY 01/13/22  Yes Gwyneth Sprout, FNP  isosorbide mononitrate (IMDUR) 60 MG 24 hr tablet TAKE 1 TABLET DAILY 01/20/22  Yes Gollan, Kathlene November, MD  ketoconazole (NIZORAL) 2 % shampoo Apply 1 application topically 2 (two) times a week.  08/11/21  Yes Gwyneth Sprout, FNP  Lancets (FREESTYLE) lancets USE TO CHECK BLOOD SUGAR ONCE DAILY 12/23/20  Yes Bacigalupo, Dionne Bucy, MD  LOSARTAN POTASSIUM PO Take 100 mg by mouth daily.   Yes [provider]  metFORMIN (GLUCOPHAGE-XR) 500 MG 24 hr tablet TAKE 2 TABLETS TWICE A DAY WITH MEALS 03/30/22  Yes Tally Joe T, FNP  Multiple Vitamin (MULTIVITAMIN WITH MINERALS) TABS tablet Take 1 tablet by mouth daily.   Yes [provider]  nitroGLYCERIN (NITROSTAT) 0.4 MG SL tablet Place 1 tablet (0.4 mg total) under the tongue every 5 (five) minutes as needed for chest pain. 02/10/22  Yes Seeley Southgate, Otila Kluver A, FNP  pantoprazole (PROTONIX) 40 MG tablet Take 1 tablet (40 mg total) by mouth daily. 08/04/21  Yes Gwyneth Sprout, FNP  potassium chloride (KLOR-CON) 10 MEQ tablet Take 2 tablets (20 mEq total) by mouth 2 (two) times daily. 12/19/21  Yes Gwyneth Sprout, FNP  pregabalin (LYRICA) 150 MG capsule TAKE 1 CAPSULE BY MOUTH TWO TIMES A DAY 08/04/21  Yes Gwyneth Sprout, FNP  senna (SENOKOT) 8.6 MG TABS tablet Take 1 tablet by mouth daily.   Yes [provider]  torsemide (DEMADEX) 20 MG tablet 20 mg 7/28 PM/noon; 40 mg 7/29 AM, ongoing 40 mg daily until seen by heart failure team Patient taking differently: Take 40 mg by mouth daily. 01/09/22  Yes Gwyneth Sprout, FNP  TRULICITY 1.5 0000000 SOPN INJECT 1.5 MG UNDER THE SKIN ONCE A WEEK 05/14/21  Yes Tally Joe T,  FNP    Review of Systems  Constitutional:  Positive for fatigue (tire easily). Negative for appetite change.  HENT:  Negative for congestion, postnasal drip and sore throat.   Eyes: Negative.   Respiratory:  Positive for chest tightness and shortness of breath (at times). Negative for cough and wheezing.   Cardiovascular:  Positive for palpitations (at times) and leg swelling (improving). Negative for chest pain.  Gastrointestinal:  Negative for abdominal distention and abdominal pain.  Endocrine: Negative.   Genitourinary: Negative.   Musculoskeletal:  Positive for back pain (has stimulator implanted). Negative for neck pain.  Skin: Negative.   Allergic/Immunologic: Negative.   Neurological:  Positive for dizziness (all the time), weakness and light-headedness. Negative for headaches.  Hematological:  Negative for adenopathy. Does not bruise/bleed easily.  Psychiatric/Behavioral:  Negative for dysphoric mood and sleep disturbance (wearing CPAP, sleeping on 2 pillows). The patient is not nervous/anxious.    Vitals:   04/07/22 0938  BP: (!) 151/83  Pulse: 73  Resp: 20  SpO2: 97%  Weight: 233 lb 4 oz (105.8 kg)  Height: 5\' 7"  (1.702 m)   Wt Readings from Last 3 Encounters:  04/07/22 233 lb 4 oz (105.8 kg)  03/24/22 232 lb 9.6 oz (105.5 kg)  02/11/22 230 lb 12.8 oz (104.7 kg)   Lab Results  Component Value Date   CREATININE 1.29 (H) 02/10/2022   CREATININE 1.14 01/09/2022   CREATININE 1.72 (H) 01/06/2022   Physical Exam Vitals and nursing note reviewed.  Constitutional:      Appearance: Normal appearance.  HENT:     Head: Normocephalic and atraumatic.  Cardiovascular:     Rate and Rhythm: Normal rate and regular rhythm.  Pulmonary:     Effort: Pulmonary effort is normal. No respiratory distress.     Breath sounds: No wheezing or rales.  Abdominal:     General: There is no distension.     Palpations: Abdomen is soft.  Musculoskeletal:        General: No tenderness.      Cervical back: Normal range of motion and neck supple.     Right lower leg: Edema (trace pitting) present.     Left lower leg: Edema (trace pitting) present.  Skin:    General: Skin is warm and dry.  Neurological:     General: No focal deficit present.     Mental Status: He is alert and oriented to person, place, and time.  Psychiatric:        Mood and Affect: Mood normal.        Behavior: Behavior normal.        Thought Content: Thought content normal.   Assessment & Plan:  1: Chronic heart failure with preserved ejection fraction without structural changes- - NYHA class III - euvolemic today - weighing daily; reminded to call for an overnight weight gain of > 2 pounds or a weekly weight gain of > 5 pounds - weight stable from last visit here 2 months ago - not adding "much" salt and says that he's trying; he is using Mrs Deliah Boston, Thayer Jew and has been reading food labels for sodium content - knows to keep daily fluid intake to 64 ounces but says that he's drinking way more than that due to his dry mouth from his diabetes; explained the rationale between his fluid intake, diuretic and edema.  - drinking 70-100 ounces of water and 2 cups of coffee daily - BNP 01/06/22 was 73.0  2: HTN- - BP mildly elevated (151/83) - saw PCP Rollene Rotunda) 01/09/22 - BMP 02/10/22 reviewed and showed sodium 141, potassium 3.9, creatinine 1.29 and GFR >60  3: DM- - A1c 11/06/21 was 6.0% - this morning at home fasting glucose was 88; he has eaten breakfast since then  4: Dizziness- - saw cardiology ([place]) 02/11/22 - he reports negative carotid ultrasound - previously has seen ENT and may be going back to see them  5: Lymphedema- - saw vascular ([place]) 03/24/22 - wearing compression socks a few times a week with good results   Medication bottles reviewed.   Return in 6 months, sooner if needed.

## 2022-04-10 NOTE — Progress Notes (Unsigned)
Cardiology Office Note    Date:  04/13/2022   ID:  Olufemi Mofield, DOB 01-13-1956, MRN [id number]  PCP:  Jacky Kindle, FNP  Cardiologist:  Julien Nordmann, MD  Electrophysiologist:  None   Chief Complaint: Follow-up  History of Present Illness:   Jonathon Newman is a 66 y.o. male with history of CAD s/p 2-vessel CABG in 2016 at the [facility], HFpEF, chronic positional dizziness felt to BPPV, chronic dyspnea, prediabetes, HTN, HLD, obesity, sleep apnea on CPAP, chronic back pain, and prior tobacco abuse who presents for follow-up of head CT, Lexiscan MPI, Zio patch, and carotid artery ultrasound performed for chronic dizziness.    He is retired from [employer] though is no longer eligible for his care through the [employer]. He previously underwent Cypher drug-eluting stent to the RCA in 2010. This was followed by a NSTEMI in 2016 with cardiac cath showing a patent RCA stent with significant distal left main disease with an FFR of 0.76 and ostial LCx stenosis. He underwent 2-vessel CABG with a LIMA to LAD and SVG to OM2 in 2016. He was seen in 06/2017 for atypical chest pain and exertional dyspnea. He underwent Lexiscan Myoview on 07/08/2017 that showed no evidence of ischemia with a normal EF. He has required adjustments to his outpatient diuretic regimen periodically.  He underwent diagnostic R/LHC on 05/02/2018 that showed significant underlying left main disease with patent LIMA to LAD and SVG to OM2. The RCA stent was patent with minimal restenosis. He had normal LVSF with a mildly elevated LVEDP at 14-15 mmHg. There was no cardiac culprit for his symptoms based on this cath. Medical therapy was advised.  For intermittent palpitations/dizziness, he underwent outpatient cardiac monitoring that showed NSR with an average heart rate of 72 bpm with a minimum heart rate of 50 bpm, 3 short runs of SVT, no significant arrhythmia to explain the patient's dizziness.  Lexiscan MPI  in 10/2019, showed no evidence of ischemia with an EF of 52%.  He underwent back surgery in 2022 complicated by foot drop and falls status post stimulator.  He was last seen in our office in 10/2021 and was taking an extra 20 mg of torsemide at lunch for lower extremity swelling, in addition to 40 mg twice daily.  Note indicates he was drinking large amounts of water.  When compared to his visit in 04/2021, his weight was up 16 pounds.  Dizziness was also noted at that time.  He was continued on torsemide 40 mg twice daily with an additional 20 mg as needed.  Echo in 11/2021 demonstrated an EF of 55 to 60%, no regional wall motion abnormalities, grade 2 diastolic dysfunction, normal RV systolic function with mildly enlarged ventricular cavity size, trivial aortic insufficiency, and an estimated right atrial pressure of 3 mmHg.   He was seen in the ED in 12/2021 at the recommendation of his PCP for elevated blood pressure and abnormal renal function (serum creatinine had trended to 2.62 with a BUN of 82).  Note indicates his PCP had taken him off his diuretic.  BP was in the 160s systolic.  High-sensitivity troponin normal x2.  BNP 73.  Chest x-ray showed no active disease.  It was noted he has a significant fluctuation in his renal function.  He was advised to follow-up as an outpatient.   He was seen by the [facility]F clinic on 02/10/2022 noting worsening dizziness that was "present all the time."  Dizziness  was worse if he looked up or bent over.  He reported symptoms were similar to what he experienced leading up to his prior PCI/CABG.  He continued to consume large amounts of liquids.  Documentation indicates he was taking torsemide 40 mg daily.  He was last seen in the office in 01/2022 noting a progression of his chronic dizziness that was largely constant and exacerbated when laying flat and gazing upwards.  Symptoms of dizziness were also associated with a room spinning sensation.  It was noted that  prior MRI of the brain in 05/2016, 03/2020, and 02/2021 showed no acute intracranial abnormality with findings of mild chronic small vessel ischemia and generalized volume loss noted.  He has previously reported vertigo therapies to be an effective.  Head CT showed no acute intracranial abnormality.  Lexiscan MPI showed no significant ischemia with an EF of 62% and was overall low risk.  Outpatient cardiac monitoring showed a predominant rhythm of sinus with an average rate of 73 bpm (range 48 to 118 bpm, 2 episodes of SVT with the fastest and longest interval lasting just 4 beats with a maximal rate of 118 bpm, frequent PACs representing an 8% burden, rare PVCs, and patient triggered events corresponding with sinus rhythm and rare PAC.  Overall, no significant arrhythmia identified.  Carotid artery ultrasound showed near normal bilateral internal carotid arteries with anterograde flow of the bilateral vertebral arteries and normal flow hemodynamics of the bilateral subclavian arteries.  He comes in today continuing to note chronic dizziness and unsteadiness with gait.  He also notes continued chest pressure and shortness of breath.  No presyncope or syncope.  No palpitations.  His functional status is limited due to his dizziness, unsteady gait, orthopedic concerns, and dyspnea.  His weight is up 5 pounds today when compared to his prior clinic visit.   Labs independently reviewed: 01/2022 - potassium 3.9, BUN 30, serum creatinine 1.29, magnesium 1.7 12/2021 - BUN 12, serum creatinine 1.14, potassium 4.9, albumin 4.3, AST 44, ALT 78, Hgb 12.0, PLT 161 10/2021 - direct LDL 34, TC 106, TG 441, HDL 27, A1c 6.0 01/2018 - TSH normal  Past Medical History:  Diagnosis Date   Anginal pain (HCC)    Arthritis    BPH (benign prostatic hyperplasia)    CHF (congestive heart failure) (HCC)    Coronary artery disease 2010   a.) LHC 2010: high grade RCA stenosis -> 2.5 x 26mm Cypher DES to RCA. b.) NSTEMI 2016 -> PCI  revealed pat RCA stent; sig dLM Dz with FFR ratio 0.76 and oLCx stenosis; ref to CVTS. c.) 2v CABG 04/21/2015; LIMA-LAD, SVG-OM2. d.) [facility] EF 55-65%; LVEDP 14-15 mmHg; 10% p-m RCA, 30% pRCA, 20% dRCA, 95% o-pLCx, 80% m-dLM; LIMA graft pat. SVG graft with mild diffuse Dz.   DDD (degenerative disc disease), lumbar    Dyspnea    GERD (gastroesophageal reflux disease)    Hypercholesteremia    Hypertension    Left foot drop    NSTEMI (non-ST elevated myocardial infarction) (HCC) 2016   a.) PCI revealed patent RCA stent; significant dLM disease with FFR ratio 0.76 and oLCx stenosis; refer to CVTS for CABG.   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   a.) 2v CABG; LIMA-LAD, SVG-OM2   T2DM (type 2 diabetes mellitus) (HCC)     Past Surgical History:  Procedure Laterality Date   APPENDECTOMY     BACK SURGERY  11/01/2017  SL 5 and S1   CARDIAC CATHETERIZATION     CARPAL TUNNEL RELEASE     left hand   CHOLECYSTECTOMY     CHOLECYSTECTOMY     CORONARY ARTERY BYPASS GRAFT  04/21/2015   CABG x 2 [facility] LIMA to LAD amd SVG to OM2   CORONARY STENT PLACEMENT  2010   Cordis Cypher Sirolimus-eluting stent 2.50 mm x 26 mm placed to the RCA at [facility]    ESOPHAGEAL MANOMETRY N/A 08/04/2017   Procedure: ESOPHAGEAL MANOMETRY (EM);  Surgeon: Toney Reil, MD;  Location: [facility] ENDOSCOPY;  Service: Endoscopy;  Laterality: N/A;   FRACTURE SURGERY     KNEE SURGERY     KNEE SURGERY     right knee    LAMINECTOMY     "plate in neck K4-Q2"   LEFT HEART CATH AND CORS/GRAFTS ANGIOGRAPHY N/A 05/02/2018   Procedure: CORS/GRAFTS ANGIOGRAPHY;  Surgeon: Iran Ouch, MD;  Location: [facility] INVASIVE CV LAB;  Service: Cardiovascular;  Laterality: N/A;   RIGHT/LEFT HEART CATH AND CORONARY ANGIOGRAPHY Bilateral 05/02/2018   Procedure: LEFT HEART CATH;  Surgeon: Iran Ouch, MD;  Location: [facility] INVASIVE CV LAB;  Service: Cardiovascular;   Laterality: Bilateral;   THORACIC LAMINECTOMY FOR SPINAL CORD STIMULATOR N/A 05/26/2021   Procedure: THORACIC SPINAL CORD STIMULATOR AND PULSE GENERATOR PLACEMENT (MEDTRONIC);  Surgeon: Lucy Chris, MD;  Location: [facility] ORS;  Service: Neurosurgery;  Laterality: N/A;   VASECTOMY      Current Medications: Current Meds  Medication Sig   acetaminophen (TYLENOL) 500 MG tablet Take 1,000 mg by mouth every 8 (eight) hours as needed for mild pain or moderate pain. Takes once a day   atorvastatin (LIPITOR) 40 MG tablet Take 1 tablet (40 mg total) by mouth daily.   Blood Glucose Monitoring Suppl (FREESTYLE LITE) DEVI To check blood sugar once daily   carvedilol (COREG) 25 MG tablet Take 1 tablet (25 mg total) by mouth 2 (two) times daily with a meal.   clobetasol (TEMOVATE) 0.05 % external solution Apply 1 application topically 2 (two) times daily.   ezetimibe (ZETIA) 10 MG tablet TAKE ONE TABLET BY MOUTH DAILY   FREESTYLE LITE test strip USE TO CHECK BLOOD SUGAR ONCE DAILY   isosorbide mononitrate (IMDUR) 60 MG 24 hr tablet TAKE 1 TABLET DAILY   ketoconazole (NIZORAL) 2 % shampoo Apply 1 application topically 2 (two) times a week.   Lancets (FREESTYLE) lancets USE TO CHECK BLOOD SUGAR ONCE DAILY   metFORMIN (GLUCOPHAGE-XR) 500 MG 24 hr tablet TAKE 2 TABLETS TWICE A DAY WITH MEALS   Multiple Vitamin (MULTIVITAMIN WITH MINERALS) TABS tablet Take 1 tablet by mouth daily.   nitroGLYCERIN (NITROSTAT) 0.4 MG SL tablet Place 1 tablet (0.4 mg total) under the tongue every 5 (five) minutes as needed for chest pain.   pantoprazole (PROTONIX) 40 MG tablet Take 1 tablet (40 mg total) by mouth daily.   potassium chloride (KLOR-CON) 10 MEQ tablet Take 2 tablets (20 mEq total) by mouth 2 (two) times daily.   pregabalin (LYRICA) 150 MG capsule TAKE 1 CAPSULE BY MOUTH TWO TIMES A DAY   sacubitril-valsartan (ENTRESTO) 49-51 MG Take 1 tablet by mouth 2 (two) times daily.   senna (SENOKOT) 8.6 MG TABS tablet Take 1  tablet by mouth daily.   torsemide (DEMADEX) 20 MG tablet 20 mg 7/28 PM/noon; 40 mg 7/29 AM, ongoing 40 mg daily until seen by heart failure team (Patient taking differently: Take 40 mg by  mouth daily.)   TRULICITY 1.5 MG/0.5ML SOPN INJECT 1.5 MG UNDER THE SKIN ONCE A WEEK   [DISCONTINUED] LOSARTAN POTASSIUM PO Take 100 mg by mouth daily.    Allergies:   Colchicine and Lisinopril   Social History   Socioeconomic History   Marital status: Divorced    Spouse name: Not on file   Number of children: Not on file   Years of education: Not on file   Highest education level: Not on file  Occupational History   Not on file  Tobacco Use   Smoking status: Former    Packs/day: 0.25    Years: 2.00    Total pack years: 0.50    Types: Cigarettes    Quit date: 05/26/2005    Years since quitting: 16.8   Smokeless tobacco: Former    Types: Associate [employer] Use: Never used  Substance and Sexual Activity   Alcohol use: No   Drug use: No   Sexual activity: Not on file  Other Topics Concern   Not on file  Social History Narrative   Lives at home with [name] (friend)    Social Determinants of Health   Financial Resource Strain: Medium Risk (12/25/2020)   Overall Financial Resource Strain (CARDIA)    Difficulty of Paying Living Expenses: Somewhat hard  Food Insecurity: Not on file  Transportation Needs: No Transportation Needs (07/30/2020)   PRAPARE - Administrator, Civil Service (Medical): No    Lack of Transportation (Non-Medical): No  Physical Activity: Not on file  Stress: Not on file  Social Connections: Not on file     Family History:  [name] family history includes Arthritis in his mother; Cancer in his maternal grandfather; Cataracts in his maternal grandmother; Glaucoma in his maternal grandmother; Heart attack in his paternal grandfather; Heart attack (age of onset: 39) in his father; Heart murmur in his brother; Hyperlipidemia in his father;  Hypertension in his father and paternal grandfather; Valvular heart disease in his brother. There is no history of Prostate cancer, Bladder Cancer, or Kidney cancer.  ROS:   12-review of systems is negative unless otherwise noted in the HPI.   EKGs/Labs/Other Studies Reviewed:    Studies reviewed were summarized above. The additional studies were reviewed today:  R/LHC 04/2018: The left ventricular systolic function is normal. LV end diastolic pressure is mildly elevated. The left ventricular ejection fraction is 55-65% by visual estimate. Prox RCA to Mid RCA lesion is 10% stenosed. Prox RCA lesion is 30% stenosed. Dist RCA lesion is 20% stenosed. SVG graft was visualized by angiography. The graft exhibits mild diffuse disease. Ost Cx to Prox Cx lesion is 95% stenosed. Mid LM to Dist LM lesion is 80% stenosed. LIMA and is normal in caliber. The graft exhibits no disease.   1. Significant underlying left main and RCA disease with patent grafts including LIMA to LAD and SVG to OM 2.  Patent RCA stent with minimal restenosis. 2.  Normal LV systolic function.  Mildly elevated left ventricular end-diastolic pressure at 14 to 15 mmHg.   Recommendations: I do not see a culprit for the patient symptoms. Even his LVEDP is only mildly elevated. Continue medical therapy for coronary artery disease and chronic diastolic heart failure. __________   Luci Bank 12/2018: 14-day ZIO patch monitor: Normal sinus rhythm with an average heart rate of 72 bpm.  Minimum heart rate was 50 bpm. 3 short runs of SVT. No significant arrhythmia to explain dizziness.  __________   Eugenie Birks MPI 07/2019: Pharmacological myocardial perfusion imaging study with no significant ischemia Small fixed apical defect of mild severity likely secondary to attenuation artifact Normal wall motion, EF estimated at 52% No EKG changes concerning for ischemia at peak stress or in recovery. Low risk scan __________   2D echo  12/11/2021: 1. Left ventricular ejection fraction, by estimation, is 55 to 60%. The  left ventricle has normal function. The left ventricle has no regional  wall motion abnormalities. Left ventricular diastolic parameters are  consistent with Grade II diastolic  dysfunction (pseudonormalization).   2. Right ventricular systolic function is normal. The right ventricular  size is mildly enlarged.   3. The mitral valve is normal in structure. No evidence of mitral valve  regurgitation.   4. The aortic valve is tricuspid. Aortic valve regurgitation is trivial.   5. The inferior vena cava is normal in size with greater than 50%  respiratory variability, suggesting right atrial pressure of 3 mmHg.   Comparison(s): Stress test:Oct 18, 2019 low risk study no significant  ischemia ejection fraction 52%. __________  Eugenie Birks MPI 02/26/2022: Pharmacological myocardial perfusion imaging study with no significant  ischemia Small region fixed defect in the inferolateral apical region, unable to exclude prior MI versus attenuation artifact GI uptake artifact noted Normal wall motion, EF estimated at 62% No EKG changes concerning for ischemia at peak stress or in recovery. CT attenuation correction images with three-vessel coronary calcification Low risk scan __________  Zio patch 01/2022: Normal sinus rhythm Patient had a min HR of 48 bpm, max HR of 118 bpm, and avg HR of 73 bpm.    2 Supraventricular Tachycardia runs occurred, the run with the fastest interval lasting 4 beats with a max rate of 118 bpm (avg 101 bpm); the run with the fastest interval was also the longest.    Isolated SVEs were frequent (8.0%, 017510), SVE Couplets were rare (<1.0%, 224), and SVE Triplets were rare (<1.0%, 29).  Isolated VEs were rare (<1.0%), and no VE Couplets or VE Triplets were present.    Triggered events associated with normal sinus rhythm, rare PAC.  No significant arrhythmia __________  Carotid artery  ultrasound 03/17/2022: Right Carotid: The extracranial vessels were near-normal with only minimal  wall thickening or plaque.   Left Carotid: The extracranial vessels were near-normal with only minimal  wall thickening or plaque.   Vertebrals:  Bilateral vertebral arteries demonstrate antegrade flow.  Subclavians: Normal flow hemodynamics were seen in bilateral subclavian arteries.     EKG:  EKG is not ordered today.    Recent Labs: 01/06/2022: B Natriuretic Peptide 73.0; Hemoglobin 12.0; Platelets 161 01/09/2022: ALT 78 02/10/2022: BUN 30; Creatinine, Ser 1.29; Magnesium 1.7; Potassium 3.9; Sodium 141  Recent Lipid Panel    Component Value Date/Time   CHOL 106 11/06/2021 0948   CHOL 146 12/27/2020 0919   TRIG 441 (H) 11/06/2021 0948   HDL 27 (L) 11/06/2021 0948   HDL 25 (L) 12/27/2020 0919   CHOLHDL 3.9 11/06/2021 0948   VLDL UNABLE TO CALCULATE IF TRIGLYCERIDE OVER 400 mg/dL 25/85/2778 2423   LDLCALC UNABLE TO CALCULATE IF TRIGLYCERIDE OVER 400 mg/dL 53/61/4431 5400   LDLCALC 22 12/27/2020 0919   LDLDIRECT 34.8 11/06/2021 0948    PHYSICAL EXAM:    VS:  BP (!) 180/100 (BP Location: Left Arm, Patient Position: Sitting, Cuff Size: Normal)   Pulse 69   Ht 5\' 7"  (1.702 m)   Wt 235 lb 3.2 oz (106.7 kg)  SpO2 95%   BMI 36.84 kg/m   BMI: Body mass index is 36.84 kg/m.  Physical Exam Vitals reviewed.  Constitutional:      Appearance: He is well-developed.  HENT:     Head: Normocephalic and atraumatic.  Eyes:     General:        Right eye: No discharge.        Left eye: No discharge.  Neck:     Vascular: No JVD.  Cardiovascular:     Rate and Rhythm: Normal rate and regular rhythm.     Pulses:          Posterior tibial pulses are 2+ on the right side and 2+ on the left side.     Heart sounds: Normal heart sounds, S1 normal and S2 normal. Heart sounds not distant. No midsystolic click and no opening snap. No murmur heard.    No friction rub.  Pulmonary:     Effort:  Pulmonary effort is normal. No respiratory distress.     Breath sounds: Normal breath sounds. No decreased breath sounds, wheezing or rales.  Chest:     Chest wall: No tenderness.  Abdominal:     General: There is no distension.  Musculoskeletal:     Cervical back: Normal range of motion.     Right lower leg: No edema.     Left lower leg: No edema.  Skin:    General: Skin is warm and dry.     Nails: There is no clubbing.  Neurological:     Mental Status: He is alert and oriented to person, place, and time.  Psychiatric:        Speech: Speech normal.        Behavior: Behavior normal.        Thought Content: Thought content normal.        Judgment: Judgment normal.     Wt Readings from Last 3 Encounters:  04/13/22 235 lb 3.2 oz (106.7 kg)  04/07/22 233 lb 4 oz (105.8 kg)  03/24/22 232 lb 9.6 oz (105.5 kg)     ASSESSMENT & PLAN:   Chronic dizziness with gait unsteadiness: Cardiac work-up has been unrevealing including prior ischemic evaluations with Lexiscan MPI x2, diagnostic R/LHC, echo, and multiple outpatient cardiac monitors.  Carotid artery ultrasound unrevealing.  CT head and prior MRI brain unrevealing.  Orthostatics negative.  No evidence of POTS.  Refer to neurology.  CAD status post CABG: Recent Lexiscan MPI was low risk and without evidence of ischemia.  Continue aggressive risk factor modification and secondary prevention including current medications.  No indication for further ischemic testing at this time.  HFpEF: His weight is up 5 pounds when compared to last clinic visit, though at his last visit he was down 10 pounds when compared to visit prior to that.  Transition from losartan to Entresto 49/51 mg twice daily.  Otherwise, continue carvedilol and torsemide.  HTN: Blood pressure is elevated in the office today.  This is likely contributing to his dyspnea and chest pressure with an element of diastolic heart failure.  Transition from losartan to [facility] as  outlined above.  Otherwise, he remains on carvedilol and isosorbide.  Not a good candidate for amlodipine due to history of lower extremity swelling with calcium channel blocker.  Would defer spironolactone use given prior history of AKI.  Hydralazine is likely to exacerbate his dizziness.  HLD: LDL 34.  He remains on atorvastatin and ezetimibe.  Obesity with sleep apnea: Weight loss  is encouraged with heart healthy diet and regular exercise, as tolerated.  Continue CPAP.   Disposition: F/u with Dr. Mariah Milling in 3 months.   Medication Adjustments/Labs and Tests Ordered: Current medicines are reviewed at length with the patient today.  Concerns regarding medicines are outlined above. Medication changes, Labs and Tests ordered today are summarized above and listed in the Patient Instructions accessible in Encounters.   Signed, Eula Listen, PA-C 04/13/2022 1:26 PM     [facility] [place] [address] [place], [place] [address] [phone]

## 2022-04-13 ENCOUNTER — Encounter: Payer: Self-pay | Admitting: Physician Assistant

## 2022-04-13 ENCOUNTER — Encounter (INDEPENDENT_AMBULATORY_CARE_PROVIDER_SITE_OTHER): Payer: Self-pay

## 2022-04-13 ENCOUNTER — Ambulatory Visit: Payer: Medicare PPO | Attending: Physician Assistant | Admitting: Physician Assistant

## 2022-04-13 VITALS — BP 180/100 | HR 69 | Ht 67.0 in | Wt 235.2 lb

## 2022-04-13 DIAGNOSIS — E785 Hyperlipidemia, unspecified: Secondary | ICD-10-CM | POA: Diagnosis not present

## 2022-04-13 DIAGNOSIS — R42 Dizziness and giddiness: Secondary | ICD-10-CM

## 2022-04-13 DIAGNOSIS — R2681 Unsteadiness on feet: Secondary | ICD-10-CM

## 2022-04-13 DIAGNOSIS — I10 Essential (primary) hypertension: Secondary | ICD-10-CM

## 2022-04-13 DIAGNOSIS — G4730 Sleep apnea, unspecified: Secondary | ICD-10-CM

## 2022-04-13 DIAGNOSIS — I5032 Chronic diastolic (congestive) heart failure: Secondary | ICD-10-CM | POA: Diagnosis not present

## 2022-04-13 DIAGNOSIS — I25118 Atherosclerotic heart disease of native coronary artery with other forms of angina pectoris: Secondary | ICD-10-CM

## 2022-04-13 MED ORDER — ENTRESTO 49-51 MG PO TABS: 1.0000 | ORAL_TABLET | Freq: Two times a day (BID) | ORAL | 3 refills | Status: DC

## 2022-04-13 NOTE — Patient Instructions (Addendum)
Medication Instructions:  Your physician has recommended you make the following change in your medication:   STOP Losartan START Entresto 49/51 twice a day  *If you need a refill on your cardiac medications before your next appointment, please call your pharmacy*   Lab Work: None  If you have labs (blood work) drawn today and your tests are completely normal, you will receive your results only by: [place] (if you have MyChart) OR A paper copy in the mail If you have any lab test that is abnormal or we need to change your treatment, we will call you to review the results.   Testing/Procedures: None   Follow-Up: At [facility], you and your health needs are our priority.  As part of our continuing mission to provide you with exceptional heart care, we have created designated Provider Care Teams.  These Care Teams include your primary Cardiologist (physician) and Advanced Practice Providers (APPs -  Physician Assistants and Nurse Practitioners) who all work together to provide you with the care you need, when you need it.   Your next appointment:   3 month(s)  The format for your next appointment:   In Person  Provider:   Ida Rogue, MD    Other Instructions Referral placed for Neurology to assess your dizziness. Scheduled appointment for 04/29/22 at 3:15 pm with Dr. Melrose Nakayama at [facility]. Their number is [phone]  Medication Samples have been provided to the patient.  Drug name: Delene Loll        Strength: 49/51 mg         Qty: 2 bottles   LOT: [id number]   Exp.Date: January 2025   Important Information about Sugar

## 2022-04-16 NOTE — Telephone Encounter (Signed)
03/19/2022  4:47 PM EDT     Reviewed results with patient and he verbalized understanding with no further questions at this time.

## 2022-04-17 ENCOUNTER — Telehealth: Payer: Medicare PPO

## 2022-04-20 ENCOUNTER — Other Ambulatory Visit: Payer: Self-pay | Admitting: Physician Assistant

## 2022-04-20 DIAGNOSIS — M238X2 Other internal derangements of left knee: Secondary | ICD-10-CM | POA: Diagnosis not present

## 2022-04-20 DIAGNOSIS — M25562 Pain in left knee: Secondary | ICD-10-CM | POA: Insufficient documentation

## 2022-04-21 ENCOUNTER — Ambulatory Visit
Admission: RE | Admit: 2022-04-21 | Discharge: 2022-04-21 | Disposition: A | Discharge status: Home / Self Care | Payer: Medicare PPO | Source: Ambulatory Visit | Attending: Physician Assistant | Admitting: Physician Assistant

## 2022-04-21 DIAGNOSIS — M1712 Unilateral primary osteoarthritis, left knee: Secondary | ICD-10-CM | POA: Diagnosis not present

## 2022-04-21 DIAGNOSIS — M25562 Pain in left knee: Secondary | ICD-10-CM | POA: Insufficient documentation

## 2022-04-23 DIAGNOSIS — S83412A Sprain of medial collateral ligament of left knee, initial encounter: Secondary | ICD-10-CM | POA: Diagnosis not present

## 2022-04-28 ENCOUNTER — Ambulatory Visit (INDEPENDENT_AMBULATORY_CARE_PROVIDER_SITE_OTHER): Payer: Medicare PPO

## 2022-04-28 DIAGNOSIS — I5042 Chronic combined systolic (congestive) and diastolic (congestive) heart failure: Secondary | ICD-10-CM

## 2022-04-28 DIAGNOSIS — E1151 Type 2 diabetes mellitus with diabetic peripheral angiopathy without gangrene: Secondary | ICD-10-CM

## 2022-04-28 NOTE — Progress Notes (Signed)
Chronic Care Management Pharmacy Note  05/14/2022 Name:  Jonathon Newman MRN:  [id number] DOB:  January 20, 1956  Summary: Patient presents for CCM follow-up. Has had flu-like symptoms for 3-weeks. No fever, has had some sinus congestion.   -Recommend SGLT2 inhibitor for DM, CKD, and HF control. After discussion with PCP, will defer at this time given recent AKI. Will plan to wait until next PCP follow-up to recheck labwork and re-assess appropriateness of initiation then.   Recommendations/Changes made from today's visit: Continue current medications  Plan: CCM follow-up 3 months  Subjective: Jonathon Newman is an 66 y.o. year old male who is a primary patient of Gwyneth Sprout, [place].  The CCM team was consulted for assistance with disease management and care coordination needs.    Engaged with patient by telephone for follow up visit in response to provider referral for pharmacy case management and/or care coordination services.   Consent to Services:  The patient was given information about Chronic Care Management services, agreed to services, and gave verbal consent prior to initiation of services.  Please see initial visit note for detailed documentation.   Patient Care Team: Gwyneth Sprout, FNP as PCP - General (Family Medicine) Minna Merritts, MD as PCP - Cardiology (Cardiology) Germaine Pomfret, [facility] (Pharmacist)  Recent office visits: 01/09/22: Patient presented to Tally Joe, FNP for AKI.   Recent consult visits: 04/13/22: Patient presented to Christell Faith, PA-C (Cardiology) for follow-up. Entresto.  04/07/22: Patient presented to Darylene Price, [place] (Cardiology) for follow-up.   Hospital visits: None in previous 6 months  Objective:  Lab Results  Component Value Date   CREATININE 1.29 (H) 02/10/2022   BUN 30 (H) 02/10/2022   GFRNONAA >60 02/10/2022   GFRAA 72 06/28/2020   NA 141 02/10/2022   K 3.9 02/10/2022   CALCIUM 9.5 02/10/2022   CO2 25 02/10/2022    GLUCOSE 127 (H) 02/10/2022    Lab Results  Component Value Date/Time   HGBA1C 6.0 (H) 11/06/2021 09:48 AM   HGBA1C 6.4 (H) 02/14/2021 03:24 PM   MICROALBUR 50 07/17/2019 07:26 PM    Last diabetic Eye exam: No results found for: "HMDIABEYEEXA"  Last diabetic Foot exam: No results found for: "HMDIABFOOTEX"   Lab Results  Component Value Date   CHOL 106 11/06/2021   HDL 27 (L) 11/06/2021   LDLCALC UNABLE TO CALCULATE IF TRIGLYCERIDE OVER 400 mg/dL 11/06/2021   LDLDIRECT 34.8 11/06/2021   TRIG 441 (H) 11/06/2021   CHOLHDL 3.9 11/06/2021       Latest Ref Rng & Units 01/09/2022   11:29 AM 01/06/2022    8:42 PM 01/01/2022    8:23 AM  Hepatic Function  Total Protein 6.0 - 8.5 g/dL 6.6  7.3  6.7   Albumin 3.9 - 4.9 g/dL 4.3  4.2  4.3   AST 0 - 40 IU/L 44  40  54   ALT 0 - 44 IU/L 78  60  91   Alk Phosphatase 44 - 121 IU/L 88  76  98   Total Bilirubin 0.0 - 1.2 mg/dL 0.4  0.9  0.4     Lab Results  Component Value Date/Time   TSH 1.600 02/02/2018 04:19 PM       Latest Ref Rng & Units 01/06/2022    8:42 PM 05/12/2021   12:47 PM 02/14/2021    3:40 AM  CBC  WBC 4.0 - 10.5 K/uL 6.0  5.8  11.0   Hemoglobin 13.0 - 17.0 g/dL 12.0  12.7  15.1   Hematocrit 39.0 - 52.0 % 34.6  36.9  41.8   Platelets 150 - 400 K/uL 161  176  220     No results found for: "VD25OH"  Clinical ASCVD: Yes  The ASCVD Risk score (Arnett DK, et al., 2019) failed to calculate for the following reasons:   The valid total cholesterol range is 130 to 320 mg/dL       02/10/2022    9:04 AM 01/13/2022    9:44 AM 12/19/2021    8:14 AM  Depression screen PHQ 2/9  Decreased Interest 0 0 0  Down, Depressed, Hopeless 0 0 0  PHQ - 2 Score 0 0 0  Altered sleeping   0  Tired, decreased energy   1  Change in appetite   0  Feeling bad or failure about yourself    0  Trouble concentrating   0  Moving slowly or fidgety/restless   0  Suicidal thoughts   0  PHQ-9 Score   1  Difficult doing work/chores   Not difficult  at all    Social History   Tobacco Use  Smoking Status Former   Packs/day: 0.25   Years: 2.00   Total pack years: 0.50   Types: Cigarettes   Quit date: 05/26/2005   Years since quitting: 16.9  Smokeless Tobacco Former   Types: Chew   BP Readings from Last 3 Encounters:  04/13/22 (!) 180/100  04/07/22 (!) 151/83  03/24/22 (!) 147/79   Pulse Readings from Last 3 Encounters:  04/13/22 69  04/07/22 73  03/24/22 78   Wt Readings from Last 3 Encounters:  04/13/22 235 lb 3.2 oz (106.7 kg)  04/07/22 233 lb 4 oz (105.8 kg)  03/24/22 232 lb 9.6 oz (105.5 kg)   BMI Readings from Last 3 Encounters:  04/13/22 36.84 kg/m  04/07/22 36.53 kg/m  03/24/22 36.43 kg/m    Assessment/Interventions: Review of patient past medical history, allergies, medications, health status, including review of consultants reports, laboratory and other test data, was performed as part of comprehensive evaluation and provision of chronic care management services.   SDOH:  (Social Determinants of Health) assessments and interventions performed: Yes SDOH Interventions    Flowsheet Row Chronic Care Management from 12/03/2020 in [place] Management from 10/28/2020 in [place] Management from 07/29/2020 in [place] Interventions     Transportation Interventions -- -- Intervention Not Indicated  Financial Strain Interventions Intervention Not Indicated Intervention Not Indicated Other (Comment)  [PAP]        SDOH Screenings   Transportation Needs: No Transportation Needs (07/30/2020)  Alcohol Screen: Low Risk  (12/19/2021)  Depression (PHQ2-9): Low Risk  (02/10/2022)  Financial Resource Strain: Medium Risk (12/25/2020)  Tobacco Use: Medium Risk (04/13/2022)    CCM Care Plan  Allergies  Allergen Reactions   Colchicine Other (See Comments)    Palpitations and headache   Lisinopril Cough    Medications Reviewed Today      Reviewed by Quentin Angst, CMA (Certified Medical Assistant) on 04/13/22 at [phone]  Med List Status: <None>   Medication Order Taking? Sig Documenting Provider Last Dose Status Informant  acetaminophen (TYLENOL) 500 MG tablet [id number] Yes Take 1,000 mg by mouth every 8 (eight) hours as needed for mild pain or moderate pain. Takes once a day [provider] Taking Active   atorvastatin (LIPITOR) 40 MG tablet [id number] Yes Take 1 tablet (40 mg total)  by mouth daily. Minna Merritts, MD Taking Active   Blood Glucose Monitoring Suppl (FREESTYLE LITE) DEVI [id number] Yes To check blood sugar once daily Mar Daring, [place] Taking Active Self  carvedilol (COREG) 25 MG tablet [id number] Yes Take 1 tablet (25 mg total) by mouth 2 (two) times daily with a meal. Gollan, Kathlene November, MD Taking Active   clobetasol (TEMOVATE) 0.05 % external solution [id number] Yes Apply 1 application topically 2 (two) times daily. Tally Joe T, FNP Taking Active   ezetimibe (ZETIA) 10 MG tablet [id number] Yes TAKE ONE TABLET BY MOUTH DAILY Rockey Situ Kathlene November, MD Taking Active   FREESTYLE LITE test strip [id number] Yes USE TO CHECK BLOOD SUGAR ONCE DAILY Gwyneth Sprout, FNP Taking Active   isosorbide mononitrate (IMDUR) 60 MG 24 hr tablet [id number] Yes TAKE 1 TABLET DAILY Gollan, Kathlene November, MD Taking Active   ketoconazole (NIZORAL) 2 % shampoo [id number] Yes Apply 1 application topically 2 (two) times a week. Gwyneth Sprout, FNP Taking Active   Lancets (FREESTYLE) lancets [id number] Yes USE TO CHECK BLOOD SUGAR ONCE DAILY Brita Romp Dionne Bucy, MD Taking Active Self  LOSARTAN POTASSIUM PO [id number] Yes Take 100 mg by mouth daily. [provider] Taking Active   metFORMIN (GLUCOPHAGE-XR) 500 MG 24 hr tablet [id number] Yes TAKE 2 TABLETS TWICE A DAY WITH MEALS Gwyneth Sprout, FNP Taking Active   Multiple Vitamin (MULTIVITAMIN WITH MINERALS) TABS tablet [id number] Yes Take 1 tablet by mouth daily. [provider] Taking Active Self  nitroGLYCERIN (NITROSTAT) 0.4 MG SL tablet [id number] Yes Place 1 tablet (0.4 mg total) under the tongue every 5 (five) minutes as needed for chest pain. Alisa Graff, FNP Taking Active   pantoprazole (PROTONIX) 40 MG tablet [id number] Yes Take 1 tablet (40 mg total) by mouth daily. Gwyneth Sprout, FNP Taking Active   potassium chloride (KLOR-CON) 10 MEQ tablet [id number] Yes Take 2 tablets (20 mEq total) by mouth 2 (two) times daily. Tally Joe T, FNP Taking Active   pregabalin (LYRICA) 150 MG capsule [id number] Yes TAKE 1 CAPSULE BY MOUTH TWO TIMES A DAY Gwyneth Sprout, FNP Taking Active   senna (SENOKOT) 8.6 MG TABS tablet [id number] Yes Take 1 tablet by mouth daily. [provider] Taking Active Self  torsemide (DEMADEX) 20 MG tablet [id number] Yes 20 mg 7/28 PM/noon; 40 mg 7/29 AM, ongoing 40 mg daily until seen by heart failure team  Patient taking differently: Take 40 mg by mouth daily.   Gwyneth Sprout, FNP Taking Active   TRULICITY 1.5 ZW/2.5EN [place] Aid [id number] Yes INJECT 1.5 MG UNDER THE SKIN ONCE A WEEK Gwyneth Sprout, FNP Taking Active             Patient Active Problem List   Diagnosis Date Noted   Swelling of limb 01/23/2022   3+ pitting edema 01/09/2022   AKI (acute kidney injury) ([place]) 01/09/2022   Left hip pain 12/19/2021   Chronic diastolic CHF (congestive heart failure) ([place]) 12/19/2021   Localized edema 12/19/2021   Morton's neuroma of both feet 07/01/2021   DDD (degenerative disc disease), lumbosacral 07/01/2021   Psoriasis of scalp 07/01/2021   Essential hypertension 03/31/2021   Type 2 diabetes mellitus with diabetic peripheral angiopathy without gangrene, without long-term current use of insulin ([place]) 03/31/2021   History of recent fall 03/31/2021   Foot drop, left 03/31/2021   BMI 33.0-33.9,adult 03/31/2021   Hyponatremia 02/14/2021   Hyperlipidemia associated with type 2 diabetes mellitus ([place])  12/27/2020   Elevated  transaminase level 10/04/2020   Morbid obesity ([place]) 05/20/2020   T2DM (type 2 diabetes mellitus) ([place]) 07/17/2019   Benign paroxysmal positional vertigo due to bilateral vestibular disorder 03/15/2019   Abnormal findings on diagnostic imaging of lung 03/15/2019   Chest pain 03/10/2019   Unstable angina ([place]) 04/25/2018   SOB (shortness of breath) 04/13/2018   Dizziness 04/13/2018   S/P coronary artery bypass graft x 2    Incomplete bladder emptying 07/21/2017   Low back pain 07/09/2017   Lumbar radiculopathy 07/09/2017   Gastroesophageal reflux disease with esophagitis 07/05/2017   Congestive heart failure ([place]) 07/05/2017   Benign prostatic hyperplasia with weak urinary stream 07/05/2017   Dysphagia 07/05/2017   DDD (degenerative disc disease), lumbar 07/05/2017   Hypertension associated with type 2 diabetes mellitus ([place]) 07/31/2015   OSA (obstructive sleep apnea) 07/31/2015    Immunization History  Administered Date(s) Administered   Moderna Sars-Covid-2 Vaccination 11/28/2019, 12/28/2019   Pneumococcal Polysaccharide-23 08/03/2012   Tdap 08/03/2012   Zoster Recombinat (Shingrix) 12/22/2018    Conditions to be addressed/monitored:  Hypertension, Hyperlipidemia, Diabetes, Heart Failure, Coronary Artery Disease, GERD and Gout  Care Plan : General Pharmacy (Adult)  Updates made by Germaine Pomfret, RPH since 05/14/2022 12:00 AM     Problem: Hypertension, Hyperlipidemia, Diabetes, Heart Failure, Coronary Artery Disease, GERD and Gout   Priority: High     Long-Range Goal: Patient-Specific Goal   Start Date: 07/29/2020  Expected End Date: 02/13/2023  This Visit's Progress: On track  Recent Progress: On track  Priority: High  Note:   Current Barriers:  Unable to independently afford treatment regimen  Pharmacist Clinical Goal(s):  Over the next 90 days, patient will verbalize ability to afford treatment regimen through collaboration with PharmD and provider.    Interventions: 1:1 collaboration with Gwyneth Sprout, FNP regarding development and update of comprehensive plan of care as evidenced by provider attestation and co-signature Inter-disciplinary care team collaboration (see longitudinal plan of care) Comprehensive medication review performed; medication list updated in electronic medical record  Heart Failure (Goal: control symptoms and prevent exacerbations) Uncontrolled Type: Diastolic -NYHA Class: II (slight limitation of activity) -Ejection fraction: 55-60% (Date: Jun 2023) -Current treatment: Carvedilol 25 mg twice daily  Imdur 60 mg daily  Entresto 49-51 mg twice daily  Torsemide 40 mg daily  -Medications previously tried: Amlodipine (Swelling), Hydralazine, Metolazone (AKI), Spironolactone, Losartan  -Home blood pressure: 140/70, 152/78  -Weight stable 228-232  -Still has dizziness at baseline.  -Continue current medications    Hyperlipidemia: (LDL goal < 70) -Controlled -Current treatment: Atorvastatin 40 mg daily Zetia 10 mg daily  -Medications previously tried: NA  -Educated on Importance of limiting foods high in cholesterol; -Recommended to continue current medication  Diabetes (A1c goal <7%) -Controlled -Current medications: Metformin ER 500 mg 2 tablets twice daily  Trulicity 1.5 mg weekly (Started 11/11/20) -Medications previously tried: NA   -Current home glucose readings fasting glucose:  101-138  In October blood sugars were ranging 87-120  -Reports hyperglycemic symptoms: dry mouth, fatigue  -Recommend SGLT2 inhibitor for DM, CKD, and HF control. After discussion with PCP, will defer at this time given recent AKI. Will plan to wait until next PCP follow-up to recheck labwork and re-assess appropriateness of initiation then.  Continue current medications   Chronic Pain (Goal: Reduce pain and improve quality of life) -Not ideally controlled -Current treatment  Acetaminophen 500 mg 1-2 tablets every 8  hours as needed  Pregabalin 150 mg  twice daily  -Medications previously tried: NA  -Utilizes spinal stimulator when hip pain worsens.  -More headaches, fatigues  Continue current medications   Gout (Goal: Prevent gout flares) -Controlled -Current treatment  None -Last gout flare: small flare lasting one day one week prior, treated with one dose of the allopurinol.   -Medications previously tried: Allopurinol (DC by provider)  Counseled patient on low purine diet plan. Counseled patient to reduce consumption of high-fructose corn syrup, sweetened soft drinks, fruit juices, meat, and seafood. Counseled patient to avoid alcohol consumption.  Continue current medications  GERD (Goal: prevent symptoms of heartburn and reflux) -controlled -Current treatment  Pantoprazole 40 mg daily  -Medications previously tried: NA  -Continue current medications  Patient Goals/Self-Care Activities Over the next 90 days, patient will:  - check glucose daily, document, and provide at future appointments check blood pressure if feeling symptoms of low blood pressure, document, and provide at future appointments  Follow Up Plan: Telephone follow up appointment with care management team member scheduled for:  04/17/22 at 8:30 AM     Medication Assistance:  Patient likely does not qualify for many forms of patient assistance due to [employer]  Patient's preferred pharmacy is:  [facility] [place] [address] [place] [address] Phone: [phone] Fax: [phone]  [id number] [id number]: [phone]: [id number][place]: [phone]: [phone]  Uses pill box? Yes Pt endorses 100% compliance  We discussed: Current pharmacy is  preferred with insurance plan and patient is satisfied with pharmacy services Patient decided to: Continue current medication management strategy  Care Plan and Follow Up Patient Decision:  Patient agrees to Care Plan and Follow-up.  Plan: Telephone follow up appointment with care management team member scheduled for:  04/17/22 at 8:30 AM  AJunius Argyle PharmD, BPara [phone]

## 2022-04-29 DIAGNOSIS — M21372 Foot drop, left foot: Secondary | ICD-10-CM | POA: Diagnosis not present

## 2022-04-29 DIAGNOSIS — R202 Paresthesia of skin: Secondary | ICD-10-CM | POA: Diagnosis not present

## 2022-04-29 DIAGNOSIS — R29818 Other symptoms and signs involving the nervous system: Secondary | ICD-10-CM | POA: Diagnosis not present

## 2022-04-29 DIAGNOSIS — R42 Dizziness and giddiness: Secondary | ICD-10-CM | POA: Diagnosis not present

## 2022-04-29 DIAGNOSIS — R200 Anesthesia of skin: Secondary | ICD-10-CM | POA: Diagnosis not present

## 2022-05-14 DIAGNOSIS — E1151 Type 2 diabetes mellitus with diabetic peripheral angiopathy without gangrene: Secondary | ICD-10-CM

## 2022-05-14 DIAGNOSIS — I5042 Chronic combined systolic (congestive) and diastolic (congestive) heart failure: Secondary | ICD-10-CM

## 2022-05-19 ENCOUNTER — Other Ambulatory Visit: Payer: Self-pay | Admitting: Family Medicine

## 2022-06-01 ENCOUNTER — Other Ambulatory Visit: Payer: Self-pay

## 2022-06-01 DIAGNOSIS — R21 Rash and other nonspecific skin eruption: Secondary | ICD-10-CM | POA: Insufficient documentation

## 2022-06-01 DIAGNOSIS — M7710 Lateral epicondylitis, unspecified elbow: Secondary | ICD-10-CM | POA: Insufficient documentation

## 2022-06-01 DIAGNOSIS — E559 Vitamin D deficiency, unspecified: Secondary | ICD-10-CM | POA: Insufficient documentation

## 2022-06-01 DIAGNOSIS — R0683 Snoring: Secondary | ICD-10-CM | POA: Insufficient documentation

## 2022-06-01 DIAGNOSIS — L259 Unspecified contact dermatitis, unspecified cause: Secondary | ICD-10-CM | POA: Insufficient documentation

## 2022-06-01 DIAGNOSIS — J312 Chronic pharyngitis: Secondary | ICD-10-CM | POA: Insufficient documentation

## 2022-06-01 DIAGNOSIS — R10811 Right upper quadrant abdominal tenderness: Secondary | ICD-10-CM | POA: Insufficient documentation

## 2022-06-01 DIAGNOSIS — M25539 Pain in unspecified wrist: Secondary | ICD-10-CM | POA: Insufficient documentation

## 2022-06-01 DIAGNOSIS — Z87898 Personal history of other specified conditions: Secondary | ICD-10-CM | POA: Insufficient documentation

## 2022-06-01 DIAGNOSIS — I2510 Atherosclerotic heart disease of native coronary artery without angina pectoris: Secondary | ICD-10-CM | POA: Insufficient documentation

## 2022-06-01 DIAGNOSIS — Z7189 Other specified counseling: Secondary | ICD-10-CM | POA: Insufficient documentation

## 2022-06-01 HISTORY — DX: Chronic pharyngitis: J31.2

## 2022-06-02 ENCOUNTER — Ambulatory Visit (INDEPENDENT_AMBULATORY_CARE_PROVIDER_SITE_OTHER): Payer: Medicare PPO | Admitting: Gastroenterology

## 2022-06-02 ENCOUNTER — Other Ambulatory Visit: Payer: Self-pay

## 2022-06-02 ENCOUNTER — Encounter: Payer: Self-pay | Admitting: Gastroenterology

## 2022-06-02 VITALS — BP 106/70 | HR 74 | Temp 98.9°F | Ht 67.0 in | Wt 220.0 lb

## 2022-06-02 DIAGNOSIS — R1319 Other dysphagia: Secondary | ICD-10-CM

## 2022-06-02 DIAGNOSIS — R7989 Other specified abnormal findings of blood chemistry: Secondary | ICD-10-CM

## 2022-06-02 DIAGNOSIS — K760 Fatty (change of) liver, not elsewhere classified: Secondary | ICD-10-CM | POA: Diagnosis not present

## 2022-06-02 DIAGNOSIS — E6609 Other obesity due to excess calories: Secondary | ICD-10-CM | POA: Diagnosis not present

## 2022-06-02 DIAGNOSIS — Z6834 Body mass index (BMI) 34.0-34.9, adult: Secondary | ICD-10-CM

## 2022-06-02 NOTE — Progress Notes (Signed)
Jonathon Mood MD, MRCP(U.K) [address]  [address]  [place], [place] [address]  Main: [phone]  Fax: [phone]   Primary Care Physician: Jonathon Kindle, FNP  Primary Gastroenterologist:  Dr. Wyline Newman   Chief Complaint  Patient presents with   Fatty liver    HPI: Jonathon Newman is a 66 y.o. male    Summary of history :   He follows with me for dysphagia.  Seen at our office back in 2022 prior to that seen in 2019 for dysphagia..  At that time the patient was recommended to undergo a esophageal manometry but he was unable to have the probe placed after multiple attempts by staff members as he describes it. The patient has tried pantoprazole 40 mg twice a day despite which had had breakthrough symptoms .   Subsequently had a barium swallow showed no abnormality in the esophagus but there was some minimal fluid going down his lungs suggesting a transfer dysphagia Interval history  10/03/2020-06/02/2022  Today he has been out for fatty liver.  Right upper quadrant in July 2023 showed fatty infiltration of the liver  He weighs 235 pounds CMP in July 2023 showed AST of 44 and ALT of 78  Since last visit he still says that food gets stuck in the middle of his chest never had an upper endoscopy.  Denies any excess alcohol use no illegal drug use no blood transfusions he served over 20 years in the [employer] was deployed to [place].  Recollects was vaccinated with vaccination gun  Current Outpatient Medications  Medication Sig Dispense Refill   acetaminophen (TYLENOL) 500 MG tablet Take 1,000 mg by mouth every 8 (eight) hours as needed for mild pain or moderate pain. Takes once a day     aspirin EC 81 MG tablet Take 81 mg by mouth daily. Swallow whole.     atorvastatin (LIPITOR) 40 MG tablet Take 1 tablet (40 mg total) by mouth daily. 90 tablet 3   Blood Glucose Monitoring Suppl (FREESTYLE LITE) DEVI To check blood sugar once daily 1 each 0   carvedilol (COREG) 25 MG  tablet Take 1 tablet (25 mg total) by mouth 2 (two) times daily with a meal. 180 tablet 3   clobetasol (TEMOVATE) 0.05 % external solution Apply 1 application topically 2 (two) times daily. 100 mL 1   ezetimibe (ZETIA) 10 MG tablet TAKE ONE TABLET BY MOUTH DAILY 90 tablet 3   FREESTYLE LITE test strip USE TO CHECK BLOOD SUGAR ONCE DAILY 100 strip 3   isosorbide mononitrate (IMDUR) 60 MG 24 hr tablet TAKE 1 TABLET DAILY 90 tablet 3   ketoconazole (NIZORAL) 2 % shampoo Apply 1 application topically 2 (two) times a week. 120 mL 1   Lancets (FREESTYLE) lancets USE TO CHECK BLOOD SUGAR ONCE DAILY 100 each 4   meclizine (ANTIVERT) 12.5 MG tablet Take 12.5 mg by mouth 2 (two) times daily.     metFORMIN (GLUCOPHAGE-XR) 500 MG 24 hr tablet TAKE 2 TABLETS TWICE A DAY WITH MEALS 120 tablet 1   Multiple Vitamin (MULTIVITAMIN WITH MINERALS) TABS tablet Take 1 tablet by mouth daily.     nitroGLYCERIN (NITROSTAT) 0.4 MG SL tablet Place 1 tablet (0.4 mg total) under the tongue every 5 (five) minutes as needed for chest pain. 25 tablet 3   pantoprazole (PROTONIX) 40 MG tablet Take 1 tablet (40 mg total) by mouth daily. 90 tablet 3   potassium chloride (KLOR-CON) 10 MEQ tablet Take 2  tablets (20 mEq total) by mouth 2 (two) times daily. 360 each 1   pregabalin (LYRICA) 150 MG capsule TAKE 1 CAPSULE BY MOUTH TWO TIMES A DAY 180 capsule 3   sacubitril-valsartan (ENTRESTO) 24-26 MG Take 1 tablet by mouth 2 (two) times daily.     senna (SENOKOT) 8.6 MG TABS tablet Take 1 tablet by mouth daily.     torsemide (DEMADEX) 20 MG tablet 20 mg 7/28 PM/noon; 40 mg 7/29 AM, ongoing 40 mg daily until seen by heart failure team (Patient taking differently: Take 40 mg by mouth daily.) 10 tablet 0   TRULICITY 1.5 MG/0.5ML SOPN INJECT 1.5 MG UNDER THE SKIN ONCE A WEEK 6 mL 3   No current facility-administered medications for this visit.    Allergies as of 06/02/2022 - Review Complete 06/02/2022  Allergen Reaction Noted    Colchicine Other (See Comments) 07/17/2019   Lisinopril Cough 12/02/2017    ROS:  General: Negative for anorexia, weight loss, fever, chills, fatigue, weakness. ENT: Negative for hoarseness, difficulty swallowing , nasal congestion. CV: Negative for chest pain, angina, palpitations, dyspnea on exertion, peripheral edema.  Respiratory: Negative for dyspnea at rest, dyspnea on exertion, cough, sputum, wheezing.  GI: See history of present illness. GU:  Negative for dysuria, hematuria, urinary incontinence, urinary frequency, nocturnal urination.  Endo: Negative for unusual weight change.    Physical Examination:   BP 106/70   Pulse 74   Temp 98.9 F (37.2 C) (Oral)   Ht 5\' 7"  (1.702 m)   Wt 220 lb (99.8 kg)   BMI 34.46 kg/m   General: Well-nourished, well-developed in no acute distress.  Eyes: No icterus. Conjunctivae pink. Abdomen: Bowel sounds are normal, nontender, nondistended, no hepatosplenomegaly or masses, no abdominal bruits or hernia , no rebound or guarding.   Extremities: No lower extremity edema. No clubbing or deformities. Neuro: Alert and oriented x 3.  Grossly intact. Skin: Warm and dry, no jaundice.   Psych: Alert and cooperative, normal Newman and affect.   Imaging Studies: No results found.  Assessment and Plan:   Jonathon Newman is a 66 y.o. y/o male has a prior history of dysphagia referred today for fatty liver seen on ultrasound in July 2023 transaminases have been mildly elevated.  Has dysphagia that persists  Plan 1.  Full autoimmune and viral hepatitis workup 2.  Next visit will determine vaccination status and give him any vaccines that he has not received 3.  Suggest to lose weight exercise eat a healthy diet would recommend a Mediterranean diet, patient information provided there may be drugs available over the next few months to treat nonalcoholic fatty liver disease more effectively we will see him back in 4 months s to see if he is eligible for  any of these medications.  Limit use of any alcohol 4.  EGD to evaluate for dysphagia and rule out eosinophilic esophagitis  I have discussed alternative options, risks & benefits,  which include, but are not limited to, bleeding, infection, perforation,respiratory complication & drug reaction.  The patient agrees with this plan & written consent will be obtained.     Dr August 2023  MD,MRCP [facility]) Follow up in 4 months

## 2022-06-02 NOTE — Patient Instructions (Signed)
Fatty Liver Disease  The liver converts food into energy, removes toxic material from the blood, makes important proteins, and absorbs necessary vitamins from food. Fatty liver disease occurs when too much fat has built up in your liver cells. Fatty liver disease is also called hepatic steatosis. In many cases, fatty liver disease does not cause symptoms or problems. It is often diagnosed when tests are being done for other reasons. However, over time, fatty liver can cause inflammation that may lead to more serious liver problems, such as scarring of the liver (cirrhosis) and liver failure. Fatty liver is associated with insulin resistance, increased body fat, high blood pressure (hypertension), and high cholesterol. These are features of metabolic syndrome and increase your risk for stroke, diabetes, and heart disease. What are the causes? This condition may be caused by components of metabolic syndrome: Obesity. Insulin resistance. High cholesterol. Other causes: Alcohol abuse. Poor nutrition. Cushing syndrome. Pregnancy. Certain drugs. Poisons. Some viral infections. What increases the risk? You are more likely to develop this condition if you: Abuse alcohol. Are overweight. Have diabetes. Have hepatitis. Have a high triglyceride level. Are pregnant. What are the signs or symptoms? Fatty liver disease often does not cause symptoms. If symptoms do develop, they can include: Fatigue and weakness. Weight loss. Confusion. Nausea, vomiting, or abdominal pain. Yellowing of your skin and the white parts of your eyes (jaundice). Itchy skin. How is this diagnosed? This condition may be diagnosed by: A physical exam and your medical history. Blood tests. Imaging tests, such as an ultrasound, CT scan, or MRI. A liver biopsy. A small sample of liver tissue is removed using a needle. The sample is then looked at under a microscope. How is this treated? Fatty liver disease is often  caused by other health conditions. Treatment for fatty liver may involve medicines and lifestyle changes to manage conditions such as: Alcoholism. High cholesterol. Diabetes. Being overweight or obese. Follow these instructions at home:  Do not drink alcohol. If you have trouble quitting, ask your health care provider how to safely quit with the help of medicine or a supervised program. This is important to keep your condition from getting worse. Eat a healthy diet as told by your health care provider. Ask your health care provider about working with a dietitian to develop an eating plan. Exercise regularly. This can help you lose weight and control your cholesterol and diabetes. Talk to your health care provider about an exercise plan and which activities are best for you. Take over-the-counter and prescription medicines only as told by your health care provider. Keep all follow-up visits. This is important. Contact a health care provider if: You have trouble controlling your: Blood sugar. This is especially important if you have diabetes. Cholesterol. Drinking of alcohol. Get help right away if: You have abdominal pain. You have jaundice. You have nausea and are vomiting. You vomit blood or material that looks like coffee grounds. You have stools that are black, tar-like, or bloody. Summary Fatty liver disease develops when too much fat builds up in the cells of your liver. Fatty liver disease often causes no symptoms or problems. However, over time, fatty liver can cause inflammation that may lead to more serious liver problems, such as scarring of the liver (cirrhosis). You are more likely to develop this condition if you abuse alcohol, are pregnant, are overweight, have diabetes, have hepatitis, or have high triglyceride or cholesterol levels. Contact your health care provider if you have trouble controlling your blood   sugar, cholesterol, or drinking of alcohol. This information is  not intended to replace advice given to you by your health care provider. Make sure you discuss any questions you have with your health care provider. Document Revised: 03/14/2020 Document Reviewed: 03/14/2020 Elsevier Patient Education  2023 [employer].  [facility] Healthy Lifestyle Nutrition and healthy eating The Mediterranean diet is a Geologist, engineering. It's focused on plants and includes the traditional flavors and cooking methods of the region.  By [facility] Staff If you're looking for a heart-healthy eating plan, the Mediterranean diet might be right for you. It's less of a diet, meaning a restricted way to eat, and more of a lifestyle.  It blends the basics of healthy eating with the traditional flavors and cooking methods of the people in the Mediterranean region.  Diet is known to have an effect on long-term diseases. These include heart and blood vessel problems known as cardiovascular disease. Observations from a study in the 1960s found that cardiovascular disease was linked to fewer deaths in some Mediterranean countries, such as [place] and [place], than in the [place]. and [place].  More-recent studies linked the Mediterranean diet with lower risk factors for heart disease, such as high cholesterol and high blood pressure.  Today, the Mediterranean diet is one of the healthy eating plans that American nutrition experts recommend. It's also recognized by the [facility] as a healthy-eating pattern.  Many cultures have eating patterns similar to the Mediterranean diet, including [place], for example.  And other diets have some of the same recommendations as the Mediterranean diet. Two examples are the Dietary Approaches to Stop Hypertension (DASH) diet, and the Dietary Guidelines for Americans.  Research suggests that it's key to follow the Mediterranean diet over the long term for your heart to benefit.  The Mediterranean diet is a way of eating based on  the traditional cuisine of countries bordering the [employer]. There's no single definition for the diet. But most often, it's high in:  Vegetables. Fruits. Whole grains. Beans. Nuts and seeds. Olive oil. Seasoning with herbs and spices. The main steps to follow the diet include:  Each day, eat vegetables, fruits, whole grains and plant-based fats. Each week, have fish, poultry, beans, legumes and eggs. Enjoy moderate portions of dairy products. Limit how much red meat you eat. Limit how many foods with added sugar you eat. Some other elements of the Mediterranean diet are to:  Share meals with family and friends. Get regular exercise. Enjoy wine in moderation if you drink alcohol. Mediterranean diet for heart health The foundation of the Mediterranean diet is plant foods. That means meals are built around vegetables, fruits, herbs, nuts, beans and whole grains.  Moderate amounts of dairy, poultry and eggs are part of the Mediterranean diet, as is seafood. In contrast, red meat is eaten only once in a while.  Unsaturated fats are a strength of the Mediterranean diet. They're eaten instead of saturated and trans fats, which play roles in heart disease.  Olive oil and nuts are the main sources of fat in the Mediterranean diet. They provide unsaturated fat. When unsaturated fat comes from plant sources, it seems to lower levels of total cholesterol as well as low-density lipoprotein, also called LDL or "bad" cholesterol.  Replacing saturated fat with polyunsaturated fat lowers risk of cardiovascular disease events and death related to cardiovascular disease, according to the Dietary Guidelines for Americans.  Seafood, seeds, nuts, legumes and some vegetable oils have healthy fats,  including the polyunsaturated kind.  Fish also are a key part of the Mediterranean diet. Some healthy choices are:  Mackerel. Herring. Sardines. [place] tuna. Salmon. Anchovies. These are  known as fatty fish. And the fats they contain are omega-3 fatty acids.  Omega-3s are unsaturated fats that may lower immune system action in the body known as inflammation. They also may help reduce blood fats called triglycerides, and they affect blood clotting. Omega 3s may lower the risk of stroke and heart failure too.  Lean fish and shellfish also are included in the Mediterranean diet. Shellfish include shrimp, crab, clams and scallops. Some types of lean fish are cod, haddock, hake and whitefish.  Choose fish that are low in mercury, such as the ones listed above. This is important for children ages 1 to 5311 and people who are pregnant and breastfeeding.  Too much mercury can harm the brain and nervous system over time. If your family catches and eats fish, check local fish advisories to find out about any cases of mercury contamination.  Like people all over the world, some who live in the Mediterranean region drink alcohol and some do not. Many versions of the Mediterranean diet include some wine with a meal.  Red wine tends to be included more often than is white wine. Some experts and dietary guidelines recommend that women limit themselves to one glass of wine a day, and for men no more than two glasses a day.  Alcohol has been linked with a lower risk of heart disease in some studies. But it's not risk-free. So don't start to drink alcohol or drink more often in hopes of gaining possible health benefits.  Recent studies cast doubt on the notion that even a little alcohol may be good for the heart.  One large study suggested that people who regularly drank any amount of alcohol had a higher risk of high blood pressure and coronary artery disease. The more alcohol they drank, the higher the risk.  Another study found that having slightly more than one alcoholic drink a day was linked with a higher risk of atrial fibrillation, a type of irregular heartbeat.  If you drink alcohol, talk  to your health care provider or a specialist in nutrition, called a dietitian, to figure out what amount -- if any -- is right for you.  Factors that affect your decision might be the extra calories alcohol brings to the diet, or any kidney or liver problems you may have. And if you just don't like the taste of alcohol, that's a good reason to stay away from it too.  Want to try the Mediterranean diet? These tips will help you get started:  Eat more fruits and vegetables. Each day, aim for 2 to 3 servings of fruit and four or more servings of vegetables. One serving of fruit equals a medium piece of whole fruit or one cup of chopped. One serving of vegetables equals two cups of leafy produce, one cup of raw veggies, or half a cup of cooked vegetables. Choose whole grains. Switch to whole-grain bread, cereal and pasta. You also can try other whole grains, such as bulgur, barley and farro. If you eat about 2,000 calories a day, aim to have at least 3 ounces of whole grains. You can get 1 ounce from a slice of bread, a cup of ready-made cereal, or half a cup of cooked rice or pasta. Read the Nutrition Facts label to find out how much of a product  is in one serving. Use unsaturated fats from plants. Replacing saturated fats with unsaturated fats may help lower the risk of heart disease. For example, you could replace butter with olive, canola, or safflower or sunflower oil in cooking or at the table. And instead of putting butter or margarine on bread, you could use nut or seed spreads on toast or on an apple. Eat more seafood. Eat fish or shellfish 2 to 3 times a week. Children and people who are pregnant or breastfeeding may want to limit certain types of fish due to mercury levels. One serving of fish is around 3 to 5 ounces for adults. That's about the size of a deck of cards. The U.S. Food and Drug Administration recommends smaller servings for children twice a week. Fresh or water-packed tuna, salmon,  trout, mackerel and herring are healthy choices. Stay away from deep-fried fish. Get nuts. Each week, aim to eat four servings of raw, unsalted nuts. One serving is a quarter of a cup. Enjoy some dairy. Some good choices are skim or 1% milk, low-fat cottage cheese, and low-fat [place] or plain yogurt. Limit how much cheese you eat. One serving is about the size of four dice. And cut back on higher fat dairy. That includes whole and 2% milk, butter, margarine, and ice cream. Reduce red and processed meat. Eat more fish, poultry or beans instead. If you eat meat, make sure it's lean and keep portions small. And before you cook it, first try to remove any fat you can see. Spice it up. Herbs and spices boost flavor and lessen the need for salt. The Mediterranean diet has a lot of flexibility, so you can make it a delicious and nutritious way to eat. Follow this eating pattern long-term to get the most of out of it.  Show References Mediterranean diet 101 brochure. [employer]. YogaGadgets.at. Accessed Nov 09, 2017. Health Education & Content Services. The Mediterranean Diet. [facility]; 2022. AlAufi N, et al. Application of Mediterranean diet in cardiovascular diseases and type 2 diabetes mellitus: Motivations and challenges. Nutrients. 2022; doi:10.3390/nu14132777. Monounsaturated fat. American Heart Association. AtomicApps.com.br. Accessed Jan. 11, 2023. Leonarda Salon (expert opinion). [facility]. Jan. 26, 2023. Mediterranean diet. [employer]. PhoneTrainer.no. Accessed Nov 09, 2017. Rimm EB, et al. Seafood long-chain n-3 polyunsaturated fatty acids and cardiovascular disease: A science advisory from the [employer]. Circulation. 2018; doi:10.1161/CIR.0000000000000574. Mazza E, et al. Mediterranean  diet in healthy aging. Journal of Nutrition, Health and Aging. 2021; doi:10.1007/s12603-720 534 4990-6. 2020-2025 Dietary Guidelines for Americans. U.S. Department of Health and [employer] and U.S. Department of Agriculture. TotallyWiped.com.ee. Accessed Jan. 11, 2023. [place]. Healthy diet in adults. WebmailGuide.co.za. Accessed Nov 09, 2017. Melbourne Abts, et al. Mediterranean-style diet for the primary and secondary prevention of cardiovascular disease. Cochrane Database of Systematic Reviews. https://www.cochranelibrary.com. Accessed November 23, 2017. Mediterranean diet. American Heart Association. GamingBus.hu. Accessed November 25, 2017. Health Evidence Network synthesis report 75. World Science writer. SleepsAround.co.za. Accessed Jan. 12, 2023. My Plate: Grains. U.S. Department of Agriculture. PureLie.ch. Accessed Jun 30, 2021. Poor nutrition. Centers for Disease Control and Prevention. PanelJobs.es. Accessed Jan. 19, 2023. Tsugane S. Why has [place] become the world's most long-lived country: Insights from a food and Counsellor. European Journal of Clinical Nutrition. 2021; doi:10.1038/s41430-361 321 0501-5. Scientific report of the 2020 Dietary Guidelines Advisory Committee. U.S. Department of Agriculture. TalkMeditation.is. Accessed Jan. 19, 2023. Biddinger K, et al. Association of habitual alcohol intake with risk of cardiovascular disease. JAMA Network Open. 2022; doi:10.1001/jamanetworkopen.2022.3849. Csengeri D,  et al. Alcohol consumption, cardiac biomarkers, and risk of atrial fibrillation and adverse outcomes.  European Heart Journal. 2021; doi:10.1093/eurheartj/ehaa953. Tou JC, et al. Lipid-modifying effects of lean fish and fish-derived protein consumption in humans: a systematic review and meta-analysis of randomized controlled trials. Nutrition Reviews. 2021; doi:10.1093/nutrit/nuab003. Questions and answers from the FDA/EPA advice about eating fish for those who might become or are pregnant or breastfeeding and children ages 1 to 54 years. U.S. Food and Drug Administration. https://kirby.com/. Accessed Jan. 27, 2023. Advice about eating fish. U.S. Food and Drug Administration. TalkAbuse.com.cy. Accessed Jan. 27, 2023. December 27, 2021 Original article: CallRank.dk  .[facility] Footer Legal Conditions and Terms Any use of this site constitutes your agreement to the Terms and Conditions and Privacy Policy linked below.  Terms and Conditions Privacy Policy Notice of Privacy Practices Notice of Nondiscrimination Manage Cookies Advertising [facility] is a nonprofit organization and proceeds from [employer] help support our mission. [facility] does not endorse any of the third party products and services advertised.  Advertising and sponsorship policy Advertising and sponsorship opportunities Reprint Permissions A single copy of these materials may be reprinted for noncommercial personal use only. "[facility]," "[facility]," "reinvestinglink.com," "[facility] Healthy Living," and the triple-shield [facility] logo are trademarks of [employer] for [employer] and Research.   [id number] [employer] for Medical Education and Research [facility]). All rights reserved.

## 2022-06-02 NOTE — Progress Notes (Signed)
Patient was instructed to hold his Trulicuty one week prior to procedure and his metformin two days prior to procedure. Patient understood.

## 2022-06-05 LAB — HEPATITIS B SURFACE ANTIGEN: Hepatitis B Surface Ag: NEGATIVE

## 2022-06-05 LAB — IRON,TIBC AND FERRITIN PANEL
Ferritin: 66 ng/mL (ref 30–400)
Iron Saturation: 19 % (ref 15–55)
Iron: 89 ug/dL (ref 38–169)
Total Iron Binding Capacity: 472 ug/dL — ABNORMAL HIGH (ref 250–450)
UIBC: 383 ug/dL — ABNORMAL HIGH (ref 111–343)

## 2022-06-05 LAB — ANA: Anti Nuclear Antibody (ANA): NEGATIVE

## 2022-06-05 LAB — MITOCHONDRIAL/SMOOTH MUSCLE AB PNL
Mitochondrial Ab: <20 Units (ref 0.0–20.0)
Smooth Muscle Ab: 4 Units (ref 0–19)

## 2022-06-05 LAB — CERULOPLASMIN: Ceruloplasmin: 19.5 mg/dL (ref 16.0–31.0)

## 2022-06-05 LAB — ANTI-MICROSOMAL ANTIBODY LIVER / KIDNEY: LKM1 Ab: 2.3 Units (ref 0.0–20.0)

## 2022-06-05 LAB — HIV ANTIBODY (ROUTINE TESTING W REFLEX): HIV Screen 4th Generation wRfx: NONREACTIVE

## 2022-06-05 LAB — CK: Total CK: 72 U/L (ref 41–331)

## 2022-06-05 LAB — IMMUNOGLOBULINS A/E/G/M, SERUM
IgA/Immunoglobulin A, Serum: 258 mg/dL (ref 61–437)
IgE (Immunoglobulin E), Serum: 90 IU/mL (ref 6–495)
IgG (Immunoglobin G), Serum: 983 mg/dL (ref 603–1613)
IgM (Immunoglobulin M), Srm: 98 mg/dL (ref 20–172)

## 2022-06-05 LAB — CELIAC DISEASE AB SCREEN W/RFX
Antigliadin Abs, IgA: 6 units (ref 0–19)
Transglutaminase IgA: <2 U/mL (ref 0–3)

## 2022-06-05 LAB — HEPATITIS B E ANTIGEN: Hep B E Ag: NEGATIVE

## 2022-06-05 LAB — HEPATITIS C ANTIBODY: Hep C Virus Ab: NONREACTIVE

## 2022-06-05 LAB — HEPATITIS B E ANTIBODY: Hep B E Ab: NEGATIVE

## 2022-06-05 LAB — HEPATITIS A ANTIBODY, TOTAL: hep A Total Ab: POSITIVE — AB

## 2022-06-05 LAB — ALPHA-1-ANTITRYPSIN: A-1 Antitrypsin: 116 mg/dL (ref 101–187)

## 2022-06-05 LAB — HEPATITIS B SURFACE ANTIBODY,QUALITATIVE: Hep B Surface Ab, Qual: REACTIVE

## 2022-06-05 LAB — HEPATITIS B CORE ANTIBODY, TOTAL: Hep B Core Total Ab: NEGATIVE

## 2022-06-15 ENCOUNTER — Other Ambulatory Visit: Payer: Self-pay | Admitting: Family Medicine

## 2022-06-15 DIAGNOSIS — E1151 Type 2 diabetes mellitus with diabetic peripheral angiopathy without gangrene: Secondary | ICD-10-CM

## 2022-06-16 ENCOUNTER — Encounter: Payer: Self-pay | Admitting: Gastroenterology

## 2022-06-17 ENCOUNTER — Ambulatory Visit: Payer: Medicare PPO | Admitting: Registered Nurse

## 2022-06-17 ENCOUNTER — Encounter
Admission: RE | Disposition: A | Discharge status: Home / Self Care | Payer: Self-pay | Source: Home / Self Care | Attending: Gastroenterology

## 2022-06-17 ENCOUNTER — Ambulatory Visit
Admission: RE | Admit: 2022-06-17 | Discharge: 2022-06-17 | Disposition: A | Discharge status: Home / Self Care | Payer: Medicare PPO | Attending: Gastroenterology | Admitting: Gastroenterology

## 2022-06-17 DIAGNOSIS — E119 Type 2 diabetes mellitus without complications: Secondary | ICD-10-CM | POA: Insufficient documentation

## 2022-06-17 DIAGNOSIS — I110 Hypertensive heart disease with heart failure: Secondary | ICD-10-CM | POA: Diagnosis not present

## 2022-06-17 DIAGNOSIS — Z79899 Other long term (current) drug therapy: Secondary | ICD-10-CM | POA: Insufficient documentation

## 2022-06-17 DIAGNOSIS — I509 Heart failure, unspecified: Secondary | ICD-10-CM | POA: Diagnosis not present

## 2022-06-17 DIAGNOSIS — Z7985 Long-term (current) use of injectable non-insulin antidiabetic drugs: Secondary | ICD-10-CM | POA: Diagnosis not present

## 2022-06-17 DIAGNOSIS — K219 Gastro-esophageal reflux disease without esophagitis: Secondary | ICD-10-CM | POA: Insufficient documentation

## 2022-06-17 DIAGNOSIS — Z87891 Personal history of nicotine dependence: Secondary | ICD-10-CM | POA: Insufficient documentation

## 2022-06-17 DIAGNOSIS — Z9049 Acquired absence of other specified parts of digestive tract: Secondary | ICD-10-CM | POA: Insufficient documentation

## 2022-06-17 DIAGNOSIS — R1314 Dysphagia, pharyngoesophageal phase: Secondary | ICD-10-CM | POA: Diagnosis not present

## 2022-06-17 DIAGNOSIS — R1319 Other dysphagia: Secondary | ICD-10-CM

## 2022-06-17 DIAGNOSIS — Z7984 Long term (current) use of oral hypoglycemic drugs: Secondary | ICD-10-CM | POA: Diagnosis not present

## 2022-06-17 DIAGNOSIS — M1990 Unspecified osteoarthritis, unspecified site: Secondary | ICD-10-CM | POA: Insufficient documentation

## 2022-06-17 DIAGNOSIS — Z951 Presence of aortocoronary bypass graft: Secondary | ICD-10-CM | POA: Diagnosis not present

## 2022-06-17 DIAGNOSIS — I25119 Atherosclerotic heart disease of native coronary artery with unspecified angina pectoris: Secondary | ICD-10-CM | POA: Insufficient documentation

## 2022-06-17 DIAGNOSIS — I252 Old myocardial infarction: Secondary | ICD-10-CM | POA: Diagnosis not present

## 2022-06-17 DIAGNOSIS — Z955 Presence of coronary angioplasty implant and graft: Secondary | ICD-10-CM | POA: Insufficient documentation

## 2022-06-17 DIAGNOSIS — E7800 Pure hypercholesterolemia, unspecified: Secondary | ICD-10-CM | POA: Diagnosis not present

## 2022-06-17 DIAGNOSIS — G4733 Obstructive sleep apnea (adult) (pediatric): Secondary | ICD-10-CM | POA: Insufficient documentation

## 2022-06-17 HISTORY — PX: ESOPHAGOGASTRODUODENOSCOPY (EGD) WITH PROPOFOL: SHX5813

## 2022-06-17 LAB — GLUCOSE, CAPILLARY: Glucose-Capillary: 139 mg/dL — ABNORMAL HIGH (ref 70–99)

## 2022-06-17 SURGERY — ESOPHAGOGASTRODUODENOSCOPY (EGD) WITH PROPOFOL
Anesthesia: General

## 2022-06-17 MED ORDER — PROPOFOL 10 MG/ML IV BOLUS
INTRAVENOUS | Status: DC | PRN
  Administered 2022-06-17: 20 mg via INTRAVENOUS
  Administered 2022-06-17: 80 mg via INTRAVENOUS

## 2022-06-17 MED ORDER — DEXMEDETOMIDINE HCL IN NACL 80 MCG/20ML IV SOLN
INTRAVENOUS | Status: DC | PRN
  Administered 2022-06-17: 8 ug via INTRAVENOUS

## 2022-06-17 MED ORDER — SODIUM CHLORIDE 0.9 % IV SOLN: INTRAVENOUS | Status: DC

## 2022-06-17 MED ORDER — LIDOCAINE HCL (CARDIAC) PF 100 MG/5ML IV SOSY
PREFILLED_SYRINGE | INTRAVENOUS | Status: DC | PRN
  Administered 2022-06-17: 80 mg via INTRAVENOUS

## 2022-06-17 NOTE — Anesthesia Preprocedure Evaluation (Signed)
Anesthesia Evaluation  Patient identified by MRN, date of birth, ID band Patient awake    Reviewed: Allergy & Precautions, NPO status , Patient's Chart, lab work & pertinent test results  Airway Mallampati: III  TM Distance: >3 FB Neck ROM: full    Dental  (+) Teeth Intact   Pulmonary neg pulmonary ROS, shortness of breath, sleep apnea , former smoker   Pulmonary exam normal  + decreased breath sounds      Cardiovascular Exercise Tolerance: Poor hypertension, Pt. on medications + angina  + CAD, + Past MI and +CHF  negative cardio ROS Normal cardiovascular exam Rhythm:Regular Rate:Normal     Neuro/Psych negative neurological ROS  negative psych ROS   GI/Hepatic negative GI ROS, Neg liver ROS,GERD  Medicated,,  Endo/Other  negative endocrine ROSdiabetes, Well Controlled    Renal/GU CRFRenal diseasenegative Renal ROS  negative genitourinary   Musculoskeletal  (+) Arthritis ,    Abdominal  (+) + obese  Peds negative pediatric ROS (+)  Hematology negative hematology ROS (+)   Anesthesia Other Findings Past Medical History: No date: Anginal pain (HCC) No date: Arthritis No date: BPH (benign prostatic hyperplasia) No date: CHF (congestive heart failure) ([place]) 2010: Coronary artery disease     Comment:  a.) LHC 2010: high grade RCA stenosis -> 2.5 x 38mm               Cypher DES to RCA. b.) NSTEMI 2016 -> PCI revealed pat               RCA stent; sig dLM Dz with FFR ratio 0.76 and oLCx               stenosis; ref to CVTS. c.) 2v CABG 04/21/2015; LIMA-LAD,               SVG-OM2. d.) [facility] 05/02/2018: EF 55-65%; LVEDP 14-15               mmHg; 10% p-m RCA, 30% pRCA, 20% dRCA, 95% o-pLCx, 80%               m-dLM; LIMA graft pat. SVG graft with mild diffuse Dz. No date: DDD (degenerative disc disease), lumbar No date: Dyspnea No date: GERD (gastroesophageal reflux disease) No date: Hypercholesteremia No date:  Hypertension No date: Left foot drop 2016: NSTEMI (non-ST elevated myocardial infarction) ([place])     Comment:  a.) PCI revealed patent RCA stent; significant dLM               disease with FFR ratio 0.76 and oLCx stenosis; refer to               CVTS for CABG. No date: OSA on CPAP No date: Postlaminectomy syndrome 04/21/2015: S/P CABG x 2     Comment:  a.) 2v CABG; LIMA-LAD, SVG-OM2 No date: T2DM (type 2 diabetes mellitus) ([place])  Past Surgical History: No date: APPENDECTOMY 11/01/2017: BACK SURGERY     Comment:  SL 5 and S1 No date: CARDIAC CATHETERIZATION No date: CARPAL TUNNEL RELEASE     Comment:  left hand No date: CHOLECYSTECTOMY No date: CHOLECYSTECTOMY 04/21/2015: CORONARY ARTERY BYPASS GRAFT     Comment:  CABG x 2 [facility] LIMA to LAD amd SVG to OM2 2010: CORONARY STENT PLACEMENT     Comment:  Cordis Cypher Sirolimus-eluting stent 2.50 mm x 26 mm               [facility] the RCA at [facility]  [facility]  08/04/2017: ESOPHAGEAL MANOMETRY; N/A     Comment:  Procedure: ESOPHAGEAL MANOMETRY (EM);  Surgeon: Lin Landsman, MD;  Location: [facility] ENDOSCOPY;  Service:               Endoscopy;  Laterality: N/A; No date: FRACTURE SURGERY No date: KNEE SURGERY No date: KNEE SURGERY     Comment:  right knee  No date: LAMINECTOMY     Comment:  "plate in neck Z6-X0" 05/02/2018: LEFT HEART CATH AND CORS/GRAFTS ANGIOGRAPHY; N/A     Comment:  Procedure: CORS/GRAFTS ANGIOGRAPHY;  Surgeon: Wellington Hampshire, MD;  Location: [place] CV LAB;                Service: Cardiovascular;  Laterality: N/A; 05/02/2018: RIGHT/LEFT HEART CATH AND CORONARY ANGIOGRAPHY; Bilateral     Comment:  Procedure: LEFT HEART CATH;  Surgeon: Wellington Hampshire,              MD;  Location: [place] CV LAB;  Service:               Cardiovascular;  Laterality: Bilateral; 05/26/2021: THORACIC LAMINECTOMY FOR SPINAL CORD STIMULATOR; N/A     Comment:  Procedure:  THORACIC SPINAL CORD STIMULATOR AND PULSE               GENERATOR PLACEMENT ([employer]);  Surgeon: Deetta Perla,               MD;  Location: [facility] ORS;  Service: Neurosurgery;                Laterality: N/A; No date: VASECTOMY  BMI    Body Mass Index: 36.81 kg/m      Reproductive/Obstetrics negative OB ROS                             Anesthesia Physical Anesthesia Plan  ASA: 4  Anesthesia Plan: General   Post-op Pain Management:    Induction: Intravenous  PONV Risk Score and Plan: Propofol infusion and TIVA  Airway Management Planned: Natural Airway  Additional Equipment:   Intra-op Plan:   Post-operative Plan:   Informed Consent: I have reviewed the patients History and Physical, chart, labs and discussed the procedure including the risks, benefits and alternatives for the proposed anesthesia with the patient or authorized representative who has indicated his/her understanding and acceptance.     Dental Advisory Given  Plan Discussed with: CRNA and Surgeon  Anesthesia Plan Comments:        Anesthesia Quick Evaluation

## 2022-06-17 NOTE — H&P (Signed)
Jonathon Bellows, MD [address] [place], [place], [place], [address] [address] [place], [place], [place], [address] Phone: [phone]  Fax: [phone]  Primary Care Physician:  Gwyneth Sprout, FNP   Pre-Procedure History & Physical: HPI:  Jonathon Newman is a 66 y.o. male is here for an endoscopy    Past Medical History:  Diagnosis Date   Anginal pain ([place])    Arthritis    BPH (benign prostatic hyperplasia)    CHF (congestive heart failure) ([place])    Coronary artery disease 2010   a.) LHC 2010: high grade RCA stenosis -> 2.5 x 43mm Cypher DES to RCA. b.) NSTEMI 2016 -> PCI revealed pat RCA stent; sig dLM Dz with FFR ratio 0.76 and oLCx stenosis; ref to CVTS. c.) 2v CABG 04/21/2015; LIMA-LAD, SVG-OM2. d.) [facility] 05/02/2018: EF 55-65%; LVEDP 14-15 mmHg; 10% p-m RCA, 30% pRCA, 20% dRCA, 95% o-pLCx, 80% m-dLM; LIMA graft pat. SVG graft with mild diffuse Dz.   DDD (degenerative disc disease), lumbar    Dyspnea    GERD (gastroesophageal reflux disease)    Hypercholesteremia    Hypertension    Left foot drop    NSTEMI (non-ST elevated myocardial infarction) ([place]) 2016   a.) PCI revealed patent RCA stent; significant dLM disease with FFR ratio 0.76 and oLCx stenosis; refer to CVTS for CABG.   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 LIMA to LAD amd SVG to OM2   CORONARY STENT PLACEMENT  2010   Cordis Cypher Sirolimus-eluting stent 2.50 mm x 26 mm placed to the RCA at [place] N/A 08/04/2017   Procedure: ESOPHAGEAL MANOMETRY (EM);  Surgeon:  Lin Landsman, MD;  Location: [facility] ENDOSCOPY;  Service: Endoscopy;  Laterality: N/A;   FRACTURE SURGERY     KNEE SURGERY     KNEE SURGERY     right knee    LAMINECTOMY     "plate in neck H3-Z1"   LEFT HEART CATH AND CORS/GRAFTS ANGIOGRAPHY N/A 05/02/2018   Procedure: CORS/GRAFTS ANGIOGRAPHY;  Surgeon: Wellington Hampshire, MD;  Location: [facility] CV LAB;  Service: Cardiovascular;  Laterality: N/A;   RIGHT/LEFT HEART CATH AND CORONARY ANGIOGRAPHY Bilateral 05/02/2018   Procedure: LEFT HEART CATH;  Surgeon: Wellington Hampshire, MD;  Location: [place] CV LAB;  Service: Cardiovascular;  Laterality: Bilateral;   THORACIC LAMINECTOMY FOR SPINAL CORD STIMULATOR N/A 05/26/2021   Procedure: THORACIC SPINAL CORD STIMULATOR AND PULSE GENERATOR PLACEMENT (MEDTRONIC);  Surgeon: Deetta Perla, MD;  Location: [facility] ORS;  Service: Neurosurgery;  Laterality: N/A;   VASECTOMY      Prior to Admission medications   Medication Sig Start Date End Date Taking? Authorizing Provider  acetaminophen (TYLENOL) 500 MG tablet Take 1,000 mg by mouth every 8 (eight) hours as needed for mild pain or moderate pain. Takes once a day    [provider]  aspirin EC 81  MG tablet Take 81 mg by mouth daily. Swallow whole.    [provider]  atorvastatin (LIPITOR) 40 MG tablet Take 1 tablet (40 mg total) by mouth daily. 11/05/21   Minna Merritts, MD  Blood Glucose Monitoring Suppl (FREESTYLE LITE) DEVI To check blood sugar once daily 07/20/19   Mar Daring, PA-C  carvedilol (COREG) 25 MG tablet Take 1 tablet (25 mg total) by mouth 2 (two) times daily with a meal. 11/05/21   Gollan, Kathlene November, MD  clobetasol (TEMOVATE) 0.05 % external solution Apply 1 application topically 2 (two) times daily. 07/29/21   Gwyneth Sprout, FNP  ezetimibe (ZETIA) 10 MG tablet TAKE ONE TABLET BY MOUTH DAILY 01/12/22   Minna Merritts, MD  FREESTYLE LITE test strip USE TO CHECK BLOOD SUGAR ONCE DAILY 01/13/22   Gwyneth Sprout, FNP  isosorbide mononitrate (IMDUR) 60 MG 24 hr tablet TAKE 1 TABLET DAILY 01/20/22   Minna Merritts, MD  ketoconazole (NIZORAL) 2 % shampoo Apply 1 application topically 2 (two) times a week. 08/11/21   Gwyneth Sprout, FNP  Lancets (FREESTYLE) lancets USE TO CHECK BLOOD SUGAR ONCE DAILY 12/23/20   Bacigalupo, Dionne Bucy, MD  meclizine (ANTIVERT) 12.5 MG tablet Take 12.5 mg by mouth 2 (two) times daily. 04/29/22   [provider]  metFORMIN (GLUCOPHAGE-XR) 500 MG 24 hr tablet TAKE 2 TABLETS TWICE A DAY WITH MEALS 03/30/22   Gwyneth Sprout, FNP  Multiple Vitamin (MULTIVITAMIN WITH MINERALS) TABS tablet Take 1 tablet by mouth daily.    [provider]  nitroGLYCERIN (NITROSTAT) 0.4 MG SL tablet Place 1 tablet (0.4 mg total) under the tongue every 5 (five) minutes as needed for chest pain. 02/10/22   Alisa Graff, FNP  pantoprazole (PROTONIX) 40 MG tablet Take 1 tablet (40 mg total) by mouth daily. 08/04/21   Gwyneth Sprout, FNP  potassium chloride (KLOR-CON) 10 MEQ tablet Take 2 tablets (20 mEq total) by mouth 2 (two) times daily. 12/19/21   Gwyneth Sprout, FNP  pregabalin (LYRICA) 150 MG capsule TAKE 1 CAPSULE BY MOUTH TWO TIMES A DAY 08/04/21   Gwyneth Sprout, FNP  sacubitril-valsartan (ENTRESTO) 24-26 MG Take 1 tablet by mouth 2 (two) times daily.    [provider]  senna (SENOKOT) 8.6 MG TABS tablet Take 1 tablet by mouth daily.    [provider]  torsemide (DEMADEX) 20 MG tablet 20 mg 7/28 PM/noon; 40 mg 7/29 AM, ongoing 40 mg daily until seen by heart failure team Patient taking differently: Take 40 mg by mouth daily. 01/09/22   Gwyneth Sprout, FNP  TRULICITY 1.5 UE/4.5WU SOPN INJECT 1.5 MG UNDER THE SKIN ONCE A WEEK 05/19/22   Tally Joe T, FNP    Allergies as of 06/02/2022 - Review Complete 06/02/2022  Allergen Reaction Noted   Colchicine Other (See Comments) 07/17/2019   Lisinopril Cough 12/02/2017    Family History  Problem Relation Age of  Onset   Arthritis Mother    Hyperlipidemia Father    Hypertension Father    Heart attack Father 31   Heart murmur Brother    Valvular heart disease Brother    Cataracts Maternal Grandmother    Glaucoma Maternal Grandmother    Cancer Maternal Grandfather    Heart attack Paternal Grandfather    Hypertension Paternal Grandfather    Prostate cancer Neg Hx    Bladder Cancer Neg Hx    Kidney cancer Neg Hx  Social History   Socioeconomic History   Marital status: Divorced    Spouse name: Not on file   Number of children: Not on file   Years of education: Not on file   Highest education level: Not on file  Occupational History   Not on file  Tobacco Use   Smoking status: Former    Packs/day: 0.25    Years: 2.00    Total pack years: 0.50    Types: Cigarettes    Quit date: 05/26/2005    Years since quitting: 17.0   Smokeless tobacco: Former    Types: Associate Professor Use: Never used  Substance and Sexual Activity   Alcohol use: No   Drug use: No   Sexual activity: Not on file  Other Topics Concern   Not on file  Social History Narrative   Lives at home with [name] (friend)    Social Determinants of Health   Financial Resource Strain: Medium Risk (12/25/2020)   Overall Financial Resource Strain (CARDIA)    Difficulty of Paying Living Expenses: Somewhat hard  Food Insecurity: Not on file  Transportation Needs: No Transportation Needs (07/30/2020)   PRAPARE - Administrator, Civil Service (Medical): No    Lack of Transportation (Non-Medical): No  Physical Activity: Not on file  Stress: Not on file  Social Connections: Not on file  Intimate Partner Violence: Not on file    Review of Systems: See HPI, otherwise negative ROS  Physical Exam: BP (!) 188/93   Pulse (!) 56   Temp 97.6 F (36.4 C) (Temporal)   Resp 16   Ht 5\' 7"  (1.702 m)   Wt 106.6 kg   SpO2 99%   BMI 36.81 kg/m  General:   Alert,  pleasant and cooperative in NAD Head:   Normocephalic and atraumatic. Neck:  Supple; no masses or thyromegaly. Lungs:  Clear throughout to auscultation, normal respiratory effort.    Heart:  +S1, +S2, Regular rate and rhythm, No edema. Abdomen:  Soft, nontender and nondistended. Normal bowel sounds, without guarding, and without rebound.   Neurologic:  Alert and  oriented x4;  grossly normal neurologically.  Impression/Plan: Jonathon Newman is here for an endoscopy  to be performed for  evaluation of dysphagia    Risks, benefits, limitations, and alternatives regarding endoscopy have been reviewed with the patient.  Questions have been answered.  All parties agreeable.   [name], MD  06/17/2022, 9:14 AM

## 2022-06-17 NOTE — Transfer of Care (Signed)
Immediate Anesthesia Transfer of Care Note  Patient: Jonathon Newman  Procedure(s) Performed: ESOPHAGOGASTRODUODENOSCOPY (EGD) WITH PROPOFOL  Patient Location: PACU  Anesthesia Type:General  Level of Consciousness: awake, alert , and oriented  Airway & Oxygen Therapy: Patient Spontanous Breathing  Post-op Assessment: Report given to RN and Post -op Vital signs reviewed and stable  Post vital signs: Reviewed and stable  Last Vitals:  Vitals Value Taken Time  BP    Temp    Pulse 61 06/17/22 0938  Resp 20 06/17/22 0938  SpO2 96 % 06/17/22 0938    Last Pain:  Vitals:   06/17/22 0938  TempSrc: (P) Temporal  PainSc:          Complications: No notable events documented.

## 2022-06-17 NOTE — Op Note (Signed)
[facility] Gastroenterology Patient Name: Stephfon Bovey Procedure Date: 06/17/2022 9:26 AM MRN: [id number] Account #: [id number] Date of Birth: 10/11/1955 Admit Type: Outpatient Age: 67 Room: [facility] ENDO ROOM 4 Gender: Male Note Status: Finalized Instrument Name: Upper Endoscope [id number] Procedure:             Upper GI endoscopy Indications:           Dysphagia Providers:             Jonathon Bellows MD, MD Medicines:             Monitored Anesthesia Care Complications:         No immediate complications. Procedure:             Pre-Anesthesia Assessment:                        - Prior to the procedure, a History and Physical was                         performed, and patient medications, allergies and                         sensitivities were reviewed. [name] tolerance                         of previous anesthesia was reviewed.                        - The risks and benefits of the procedure and the                         sedation options and risks were discussed with the                         patient. All questions were answered and informed                         consent was obtained.                        - After reviewing the risks and benefits, the patient                         was deemed in satisfactory condition to undergo the                         procedure.                        - ASA Grade Assessment: II - A patient with mild                         systemic disease.                        After obtaining informed consent, the endoscope was                         passed under direct vision. Throughout the procedure,  the patient's blood pressure, pulse, and oxygen                         saturations were monitored continuously. The Endoscope                         was introduced through the mouth, and advanced to the                         third part of duodenum. The upper GI endoscopy was                          accomplished with ease. The patient tolerated the                         procedure well. Findings:      The examined esophagus was normal. A TTS dilator was passed through the       scope. Dilation with a 15-16.5-18 mm balloon dilator was performed to 18       mm. The dilation site was examined and showed no change. Biopsies were       taken with a cold forceps for histology.      The stomach was normal.      The examined duodenum was normal.      The cardia and gastric fundus were normal on retroflexion. Impression:            - Normal esophagus. Dilated. Biopsied.                        - Normal stomach.                        - Normal examined duodenum. Recommendation:        - Await pathology results.                        - Discharge patient to home (with escort).                        - Resume previous diet.                        - Continue present medications.                        - Return to my office as previously scheduled. Procedure Code(s):     --- Professional ---                        [phone], Esophagogastroduodenoscopy, flexible,                         transoral; with transendoscopic balloon dilation of                         esophagus (less than 30 mm diameter)                        [id number], 59, Esophagogastroduodenoscopy, flexible,  transoral; with biopsy, single or multiple Diagnosis Code(s):     --- Professional ---                        R13.10, Dysphagia, unspecified CPT copyright 2022 American Medical Association. All rights reserved. The codes documented in this report are preliminary and upon coder review may  be revised to meet current compliance requirements. Jonathon Bellows, MD Jonathon Bellows MD, MD 06/17/2022 9:36:41 AM This report has been signed electronically. Number of Addenda: 0 Note Initiated On: 06/17/2022 9:26 AM Estimated Blood Loss:  Estimated blood loss: none.      [facility]

## 2022-06-17 NOTE — Anesthesia Postprocedure Evaluation (Signed)
Anesthesia Post Note  Patient: Jonathon Newman  Procedure(s) Performed: ESOPHAGOGASTRODUODENOSCOPY (EGD) WITH PROPOFOL  Patient location during evaluation: PACU Anesthesia Type: General Level of consciousness: awake and awake and alert Pain management: satisfactory to patient Vital Signs Assessment: post-procedure vital signs reviewed and stable Respiratory status: spontaneous breathing and respiratory function stable Cardiovascular status: stable Anesthetic complications: no  No notable events documented.   Last Vitals:  Vitals:   06/17/22 0948 06/17/22 0958  BP: (!) 160/88 (!) 154/90  Pulse: 63 (!) 57  Resp: 19 19  Temp:    SpO2: 97% 96%    Last Pain:  Vitals:   06/17/22 0958  TempSrc:   PainSc: 0-No pain                 VAN STAVEREN,Quantae Martel

## 2022-06-18 ENCOUNTER — Encounter: Payer: Self-pay | Admitting: Gastroenterology

## 2022-06-18 LAB — SURGICAL PATHOLOGY

## 2022-06-23 ENCOUNTER — Encounter: Payer: Self-pay | Admitting: Gastroenterology

## 2022-06-30 ENCOUNTER — Other Ambulatory Visit: Payer: Self-pay

## 2022-06-30 ENCOUNTER — Telehealth: Payer: Self-pay | Admitting: Cardiovascular Disease

## 2022-06-30 ENCOUNTER — Telehealth: Payer: Self-pay

## 2022-06-30 MED ORDER — ATORVASTATIN CALCIUM 40 MG PO TABS: 40.0000 mg | ORAL_TABLET | Freq: Every day | ORAL | 3 refills | Status: DC

## 2022-06-30 NOTE — Telephone Encounter (Signed)
*  STAT* If patient is at the pharmacy, call can be transferred to refill team.   1. Which medications need to be refilled? (please list name of each medication and dose if known) atorvastatin (LIPITOR) 40 MG tablet   2. Which pharmacy/location (including street and city if local pharmacy) is medication to be sent to? [facility] [place], [place]   3. Do they need a 30 day or 90 day supply? 90  Pt is currently out of this medication

## 2022-06-30 NOTE — Progress Notes (Signed)
Care Management & Coordination Services Pharmacy Team  Reason for Encounter: General adherence update   Contacted patient on 06/30/2022 for general disease state and medication adherence call.   Recent office visits:  None ID  Recent consult visits:  06/02/2022 Dr. Vicente Males MD (Gastroenterology) No Medication Changes noted, follow up in 4 months 04/29/2022 Dr. Melrose Nakayama MD (Neurology) Start meclizine 12.5 mg twice daily for dizziness for two weeks, then increase to 25 mg twice daily, follow up 2-3 months  Hospital visits:  Medication Reconciliation was completed by comparing discharge summary, patient's EMR and Pharmacy list, and upon discussion with patient.  Admitted to the hospital on 06/17/2022 due to  Esophageal dysphagia. Discharge date was 06/17/2022. Discharged from [place]?Medications Started at [facility] Discharge:?? -started None ID  Medication Changes at Hospital Discharge: -Changed None ID  Medications Discontinued at Hospital Discharge: -Stopped None ID  Medications that remain the same after Hospital Discharge:??  -All other medications will remain the same.    Medications: Outpatient Encounter Medications as of 06/30/2022  Medication Sig   acetaminophen (TYLENOL) 500 MG tablet Take 1,000 mg by mouth every 8 (eight) hours as needed for mild pain or moderate pain. Takes once a day   aspirin EC 81 MG tablet Take 81 mg by mouth daily. Swallow whole.   atorvastatin (LIPITOR) 40 MG tablet Take 1 tablet (40 mg total) by mouth daily.   Blood Glucose Monitoring Suppl (FREESTYLE LITE) DEVI To check blood sugar once daily   carvedilol (COREG) 25 MG tablet Take 1 tablet (25 mg total) by mouth 2 (two) times daily with a meal.   clobetasol (TEMOVATE) 0.05 % external solution Apply 1 application topically 2 (two) times daily.   ezetimibe (ZETIA) 10 MG tablet TAKE ONE TABLET BY MOUTH DAILY   FREESTYLE LITE test strip USE TO CHECK BLOOD SUGAR ONCE DAILY    isosorbide mononitrate (IMDUR) 60 MG 24 hr tablet TAKE 1 TABLET DAILY   ketoconazole (NIZORAL) 2 % shampoo Apply 1 application topically 2 (two) times a week.   Lancets (FREESTYLE) lancets USE TO CHECK BLOOD SUGAR ONCE DAILY   meclizine (ANTIVERT) 12.5 MG tablet Take 12.5 mg by mouth 2 (two) times daily.   metFORMIN (GLUCOPHAGE-XR) 500 MG 24 hr tablet TAKE 2 TABLETS TWICE A DAY WITH MEALS   Multiple Vitamin (MULTIVITAMIN WITH MINERALS) TABS tablet Take 1 tablet by mouth daily.   nitroGLYCERIN (NITROSTAT) 0.4 MG SL tablet Place 1 tablet (0.4 mg total) under the tongue every 5 (five) minutes as needed for chest pain.   pantoprazole (PROTONIX) 40 MG tablet Take 1 tablet (40 mg total) by mouth daily.   potassium chloride (KLOR-CON) 10 MEQ tablet Take 2 tablets (20 mEq total) by mouth 2 (two) times daily.   pregabalin (LYRICA) 150 MG capsule TAKE 1 CAPSULE BY MOUTH TWO TIMES A DAY   sacubitril-valsartan (ENTRESTO) 24-26 MG Take 1 tablet by mouth 2 (two) times daily.   senna (SENOKOT) 8.6 MG TABS tablet Take 1 tablet by mouth daily.   torsemide (DEMADEX) 20 MG tablet 20 mg 7/28 PM/noon; 40 mg 7/29 AM, ongoing 40 mg daily until seen by heart failure team (Patient taking differently: Take 40 mg by mouth daily.)   TRULICITY 1.5 KP/5.4SF SOPN INJECT 1.5 MG UNDER THE SKIN ONCE A WEEK   No facility-administered encounter medications on file as of 06/30/2022.    Recent vitals BP Readings from Last 3 Encounters:  06/17/22 (!) 154/90  06/02/22 106/70  04/13/22 Marland Kitchen)  180/100   Pulse Readings from Last 3 Encounters:  06/17/22 (!) 57  06/02/22 74  04/13/22 69   Wt Readings from Last 3 Encounters:  06/17/22 235 lb (106.6 kg)  06/02/22 220 lb (99.8 kg)  04/13/22 235 lb 3.2 oz (106.7 kg)   BMI Readings from Last 3 Encounters:  06/17/22 36.81 kg/m  06/02/22 34.46 kg/m  04/13/22 36.84 kg/m    Recent lab results    Component Value Date/Time   NA 141 02/10/2022 0933   NA 141 01/09/2022 1129   K  3.9 02/10/2022 0933   CL 104 02/10/2022 0933   CO2 25 02/10/2022 0933   GLUCOSE 127 (H) 02/10/2022 0933   BUN 30 (H) 02/10/2022 0933   BUN 12 01/09/2022 1129   CREATININE 1.29 (H) 02/10/2022 0933   CALCIUM 9.5 02/10/2022 0933    Lab Results  Component Value Date   CREATININE 1.29 (H) 02/10/2022   EGFR 71 01/09/2022   GFRNONAA >60 02/10/2022   GFRAA 72 06/28/2020   Lab Results  Component Value Date/Time   HGBA1C 6.0 (H) 11/06/2021 09:48 AM   HGBA1C 6.4 (H) 02/14/2021 03:24 PM   MICROALBUR 50 07/17/2019 07:26 PM    Lab Results  Component Value Date   CHOL 106 11/06/2021   HDL 27 (L) 11/06/2021   LDLCALC UNABLE TO CALCULATE IF TRIGLYCERIDE OVER 400 mg/dL 11/06/2021   LDLDIRECT 34.8 11/06/2021   TRIG 441 (H) 11/06/2021   CHOLHDL 3.9 11/06/2021     What concerns do you have about your medications?  The patient reports the following side effects with their medications.  Patient states he is having worse symptoms of dizziness.Patient states the meclizine that he was started on has not even touch it and may have made his symptoms worst. Patient reports he does have a follow up schedule and will discuss other options.  Are you having any problems getting your medications from your pharmacy? Yes Patient reports he is out of his atorvastatin completely and unsure why [facility] is unable to get a refill from his Cardiology.I reach out to his cardiology to request a [facility]ill [facility] as [facility] from patient.Per Cardiology, they will send the message to the refill team.  Has the cost of your medications been a concern? No If yes, what medication and is patient assistance available or has it been applied for?  Since last visit with PharmD, the following interventions have been made.   04/29/2022 Dr. Melrose Nakayama MD (Neurology) Start meclizine 12.5 mg twice daily for dizziness for two weeks, then increase to 25 mg twice daily, follow up 2-3 months  The patient has had an  ED visit since last contact.   The patient reports the following problems with their health.   Patient reports having pain, and numbness in the bottom of his feet.Patient reports the pain is so painful that it causes him to be off balance "its like I'm stepping on top of needles". Patient states he would like a referral to another podiatrist as he is not sure what happen to the one he was seeing and he was unhappy with their care.Patient reports he was suppose to get  some kind of footwear for his feet, but the office never follow through. Notified Clinical pharmacist if he can place a referral.   Patient reports the following concerns or questions for Daron Offer , PharmD at this time.   Counseled patient on: Importance of taking medication daily without missed doses and Access  to carecoordination team for any cost, medication or pharmacy concerns.   Care Gaps: Annual wellness visit in last year? No-Never done  If Diabetic: Last eye exam / retinopathy screening: Never done Last diabetic foot exam:None ID Last UACR: Last completed 07/17/2019  Care Gaps: Ophthalmology Exam Shingrix Vaccine COVID-19 Vaccine Pneumonia Vaccine Influenza Vaccine HEMOGLOBIN A1C HTN 155/82 - 01/09/2022   Star Rating Drugs: Atorvastatin 40 mg last filled 07/29/2021 for 90 day supply at [facility]. Metformin 500 mg ast filled 05/01/2022 for 90 day supply at [facility]. Trulicity 1.5 mg ast filled 05/19/2022 for 84 day supply at [facility].  Everlean Cherry Clinical Pharmacist Assistant [phone]

## 2022-06-30 NOTE — Telephone Encounter (Signed)
Disp Refills Start End   atorvastatin (LIPITOR) 40 MG tablet 90 tablet 3 06/30/2022    Sig - Route: Take 1 tablet (40 mg total) by mouth daily. - Oral   Sent to pharmacy as: atorvastatin (LIPITOR) 40 MG tablet   E-Prescribing Status: Receipt confirmed by pharmacy (06/30/2022  4:12 PM EST)    Pharmacy  [place], [place]

## 2022-07-01 ENCOUNTER — Other Ambulatory Visit: Payer: Self-pay | Admitting: Family Medicine

## 2022-07-01 DIAGNOSIS — E1151 Type 2 diabetes mellitus with diabetic peripheral angiopathy without gangrene: Secondary | ICD-10-CM

## 2022-07-09 ENCOUNTER — Ambulatory Visit: Payer: Medicare PPO | Attending: Neurology

## 2022-07-09 DIAGNOSIS — M6281 Muscle weakness (generalized): Secondary | ICD-10-CM | POA: Diagnosis not present

## 2022-07-09 DIAGNOSIS — R42 Dizziness and giddiness: Secondary | ICD-10-CM | POA: Insufficient documentation

## 2022-07-09 DIAGNOSIS — R262 Difficulty in walking, not elsewhere classified: Secondary | ICD-10-CM | POA: Insufficient documentation

## 2022-07-09 DIAGNOSIS — R2681 Unsteadiness on feet: Secondary | ICD-10-CM | POA: Insufficient documentation

## 2022-07-09 DIAGNOSIS — M542 Cervicalgia: Secondary | ICD-10-CM | POA: Diagnosis not present

## 2022-07-09 NOTE — Therapy (Signed)
OUTPATIENT PHYSICAL THERAPY VESTIBULAR EVALUATION     Patient Name: Jonathon Newman MRN: [id number] DOB:28-Oct-1955, 67 y.o., male Today's Date: 07/10/2022  END OF SESSION:  PT End of Session - 07/09/22 1549     Visit Number 1    Number of Visits 25    Date for PT Re-Evaluation 10/01/22    PT Start Time 0851    PT Stop Time 0945    PT Time Calculation (min) 54 min    Activity Tolerance Patient tolerated treatment well    Behavior During Therapy [facility] for tasks assessed/performed             Past Medical History:  Diagnosis Date   Anginal pain ([place])    Arthritis    BPH (benign prostatic hyperplasia)    CHF (congestive heart failure) ([place])    Coronary artery disease 2010   a.) [place] 2010: high grade RCA stenosis -> 2.5 x 48mm Cypher DES to RCA. b.) NSTEMI 2016 -> PCI revealed pat RCA stent; sig dLM Dz with FFR ratio 0.76 and oLCx stenosis; ref to CVTS. c.) 2v CABG 04/21/2015; LIMA-LAD, SVG-OM2. d.) [facility] 05/02/2018: EF 55-65%; LVEDP 14-15 mmHg; 10% p-m RCA, 30% pRCA, 20% dRCA, 95% o-pLCx, 80% m-dLM; LIMA graft pat. SVG graft with mild diffuse Dz.   DDD (degenerative disc disease), lumbar    Dyspnea    GERD (gastroesophageal reflux disease)    Hypercholesteremia    Hypertension    Left foot drop    NSTEMI (non-ST elevated myocardial infarction) ([place]) 2016   a.) PCI revealed patent RCA stent; significant dLM disease with FFR ratio 0.76 and oLCx stenosis; refer to CVTS for CABG.   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 LIMA to LAD amd SVG to OM2   CORONARY STENT PLACEMENT  2010   Cordis  Cypher Sirolimus-eluting stent 2.50 mm x 26 mm placed to the RCA at [place] N/A 08/04/2017   Procedure: ESOPHAGEAL MANOMETRY (EM);  Surgeon: Lin Landsman, MD;  Location: [facility] ENDOSCOPY;  Service: Endoscopy;  Laterality: N/A;   ESOPHAGOGASTRODUODENOSCOPY (EGD) WITH PROPOFOL N/A 06/17/2022   Procedure: ESOPHAGOGASTRODUODENOSCOPY (EGD) WITH PROPOFOL;  Surgeon: Jonathon Bellows, MD;  Location: [facility] ENDOSCOPY;  Service: Gastroenterology;  Laterality: N/A;   FRACTURE SURGERY     KNEE SURGERY     KNEE SURGERY     right knee    LAMINECTOMY     "plate in neck B8-G6"   LEFT HEART CATH AND CORS/GRAFTS ANGIOGRAPHY N/A 05/02/2018   Procedure: CORS/GRAFTS ANGIOGRAPHY;  Surgeon: Wellington Hampshire, MD;  Location: [place] CV LAB;  Service: Cardiovascular;  Laterality: N/A;   RIGHT/LEFT HEART CATH AND CORONARY ANGIOGRAPHY Bilateral 05/02/2018   Procedure: LEFT HEART CATH;  Surgeon: Wellington Hampshire, MD;  Location: [place] CV LAB;  Service: Cardiovascular;  Laterality: Bilateral;   THORACIC LAMINECTOMY FOR SPINAL CORD STIMULATOR N/A 05/26/2021   Procedure: THORACIC SPINAL CORD STIMULATOR AND  PULSE GENERATOR PLACEMENT ([employer]TRONIC);  Surgeon: Deetta Perla, MD;  Location: [facility] ORS;  Service: Neurosurgery;  Laterality: N/A;   VASECTOMY     Patient Active Problem List   Diagnosis Date Noted   CAD (coronary artery disease) 06/01/2022   Vitamin D deficiency 06/01/2022   Snoring 06/01/2022   Other specified counseling 06/01/2022   Lateral epicondylitis (tennis elbow) 06/01/2022   History of heartburn 06/01/2022   Contact dermatitis 06/01/2022   Chronic pharyngitis 06/01/2022   Abdominal tenderness, right upper quadrant 06/01/2022   Pain in joint, forearm 06/01/2022   Rash and other nonspecific skin eruption 06/01/2022   Pain in joint of left knee 04/20/2022   Swelling of limb 01/23/2022   3+ pitting edema 01/09/2022   AKI (acute kidney injury) ([place])  01/09/2022   Left hip pain 12/19/2021   Chronic diastolic CHF (congestive heart failure) ([place]) 12/19/2021   Localized edema 12/19/2021   Morton's neuroma of both feet 07/01/2021   DDD (degenerative disc disease), lumbosacral 07/01/2021   Psoriasis of scalp 07/01/2021   Essential hypertension 03/31/2021   Type 2 diabetes mellitus with diabetic peripheral angiopathy without gangrene, without long-term current use of insulin ([place]) 03/31/2021   History of recent fall 03/31/2021   Foot drop, left 03/31/2021   BMI 33.0-33.9,adult 03/31/2021   Hyponatremia 02/14/2021   Hyperlipidemia associated with type 2 diabetes mellitus ([place]) 12/27/2020   Elevated transaminase level 10/04/2020   Morbid obesity ([place]) 05/20/2020   T2DM (type 2 diabetes mellitus) ([place]) 07/17/2019   Strain of foot 05/09/2019   Benign paroxysmal positional vertigo due to bilateral vestibular disorder 03/15/2019   Abnormal findings on diagnostic imaging of lung 03/15/2019   Chest pain 03/10/2019   Pain in right knee 09/10/2018   Unstable angina (H[facility]) 04/25/2018   SOB (shortness of breath) 04/13/2018   Vertigo 04/13/2018   S/P coronary artery bypass graft x 2    Incomplete bladder emptying 07/21/2017   Low back pain 07/09/2017   Lumbar radiculopathy 07/09/2017   Gastroesophageal reflux disease with esophagitis 07/05/2017   Congestive heart failure ([place]) 07/05/2017   Benign prostatic hyperplasia with weak urinary stream 07/05/2017   Dysphagia 07/05/2017   DDD (degenerative disc disease), lumbar 07/05/2017   Hypertension associated with type 2 diabetes mellitus ([place]) 07/31/2015   OSA (obstructive sleep apnea) 07/31/2015    PCP: Gwyneth Sprout, FNP  REFERRING PROVIDER: Anabel Bene, MD   REFERRING DIAG: R42 (ICD-10-CM) - Dizziness   THERAPY DIAG:  Dizziness and giddiness  Unsteadiness on feet  ONSET DATE: Reports onset of approximately 1 year ago (2023)  Rationale for Evaluation and Treatment:  Rehabilitation  SUBJECTIVE:   SUBJECTIVE STATEMENT: Pt presents to PT for evaluation of chronic dizziness.   Pt accompanied by: self  PERTINENT HISTORY:   Pt is a 67 y/o male presenting to PT for chronic dizziness. He is using a SPC to ambulate. He reports onset over a year ago. Pt states "there's days it's just crazy, and days I can do it." Dizziness can start 5-10 minutes after lying down. Dizziness occurs with sit>stand. To be careful, he will sit for 2-3 minutes prior to standing. Pt can experience spinning while ambulating. He reports nature of dizziness is typically a spinning sensation that lasts for 15-20 seconds. Frequency of dizziness is random. Most recent episode was this past Tuesday when lying down. Reports he can go a week before having another episode. Rolling over does not cause spinning, tilting his head back causes him to feel unsteady.  He reports he has tried canalith repositioning maneuvers in past without success. Pt did have migraines when younger (30 years ago), seldom gets headaches now. Pt does take aspirin for his heart and Tyelenol (hx of CAB x2, 2016). Pt also had stimulator placed (thoracic, 2022) due to back problems that caused difficulty walking and maintaining balance. Pt reports hx LE weakness but no hx of stroke. Pt has fallen multiple times and 2x in past six months. He denies hearing loss, does have B tinnitus (low frequency), and reports no ear pain. He reports no unusual changes in vision. Pt can feel lightheaded multiple times/month, says it is random. 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 He has difficulty navigating stairs. 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 PMH significant for the following: arthritis, CHF, coronary artery disease, DDD, hypercholesteremia, HTN, L foot drop, NSTEMI, OSA on CPAP, postlaminectomy syndrome S/P CAB x2 2016, DMII, low back pain, lumbar radiculopathy, vertigo, BPPV, L hip pain, AKI,  Vitamin D deficiency, pain in joint of L knee, lateral epicondylitis, please refer to chart for full details.   PAIN:  Are you having pain? Yes: NPRS scale: not rated/10 Pain location: back pain Pain description:   Aggravating factors:   Relieving factors: has stimulator   PRECAUTIONS: Fall  WEIGHT BEARING RESTRICTIONS: No  FALLS: Has patient fallen in last 6 months? Yes. Number of falls 2, no head impact or injuries with falls  LIVING ENVIRONMENT: Lives with:  Lives in:  Stairs:  Has following equipment at home:   PLOF: Independent  PATIENT GOALS: reduce dizziness  OBJECTIVE:   DIAGNOSTIC FINDINGS:   PER referral [place]: "Reviewed head CT and carotid ultrasound, which were within normal limits."  CT HEAD WO CONTRAST 03/04/2022 " IMPRESSION: No acute intracranial abnormality."  MR Cervical spine WO contrast 01/06/2021" IMPRESSION: At C6-C7, there has been prior ACDF. There is fusion across the disc space. No significant spinal canal or foraminal stenosis.   Cervical spondylosis, as outlined. No more than mild spinal canal stenosis. Multilevel neural foraminal narrowing, greatest on the left at C3-C4 (moderate), on the left at C4-C5 (moderate) and bilaterally at C5-C6 (moderate right, moderate/severe left).   Also of note, there is a small right-sided facet joint effusion at C4-C5."  MR Brain WO contrast 02/14/2021 "IMPRESSION: 1. No acute intracranial abnormality. 2. Findings of mild chronic small vessel ischemia and generalized volume loss."   COGNITION: Overall cognitive status: Within functional limits for tasks assessed   SENSATION: Impaired, pt reports neuropathy that affects feet bilat    POSTURE:  rounded shoulders  Cervical ROM:  deferred  STRENGTH: deferred    TRANSFERS: Assistive device utilized: Single point cane  Sit to stand: Modified independence Stand to sit: Modified independence   GAIT: Gait pattern:  observed decreased gait  speed (further assessment to be completed future session), pt ambulates with SPC Distance walked: clinic distances Assistive device utilized: Single point cane   FUNCTIONAL TESTS:  DGI, to be completed next 1-2 visits  PATIENT SURVEYS:  FOTO 62 ABC Scale: 84.4%  VESTIBULAR ASSESSMENT:  GENERAL OBSERVATION: Relies on SPC for balance, cautious ambulator (gait speed to be formally assessed future visit)   SYMPTOM BEHAVIOR:  Subjective history: Pt describes primarily a spinning dizziness and at times a lightheadedness. Symptom onset >1 year ago. Positionally triggered and also random.  Non-Vestibular symptoms: headaches and tinnitus  Type of dizziness: Spinning/Vertigo and Lightheadedness/Faint  Frequency: Pt reports is mostly random  Duration: seconds  Aggravating factors: Spontaneous and Induced by position change: also reports occurs when ambulating   OCULOMOTOR EXAM:  Ocular Alignment: normal  Ocular ROM: No Limitations  Spontaneous Nystagmus: absent  Gaze-Induced Nystagmus: absent  Smooth Pursuits: majority of movement appears smooth, however, few possible saccadic intrusions noted  Saccades: intact  Convergence/Divergence: at 6" sees double   VESTIBULAR - OCULAR REFLEX:   Slow VOR:   VOR Cancellation:   Head-Impulse Test: deferred  Dynamic Visual Acuity: Abnormal, line change 10 to 7, however, no dizziness with testing   POSITIONAL TESTING:   Dix-Hallpike  -R side: instant onset of jerk nystagmus, downbeat and R torsional lasting <10 sec -L side: very weak, subtle jerk nystagmus also downbeat appears torsional to R side  Roll testing: negative bilaterally   OTHOSTATICS: deferred  FUNCTIONAL GAIT: Dynamic Gait Index: to be complete next 1-2 visits    VESTIBULAR TREATMENT:                                                                                                   DATE:   Reviewed HEP (below) within session to confirm correct technique. Pt,  seated, completed VORx1, plain background horizontal head turns 2x30 sec, and vertical head turns 1x30 sec.    PATIENT EDUCATION: Education details: exam findings, plan, HEP Person educated: Patient Education method: Explanation, Demonstration, Tactile cues, Verbal cues, and Handouts Education comprehension: verbalized understanding, returned demonstration, and needs further education  HOME EXERCISE PROGRAM:  Access Code: [email or web address] URL: [email or web address] Date: 07/09/2022 Prepared by: Ricard Dillon  Exercises - Seated Gaze Stabilization with Head Rotation  - 1 x daily - 7 x weekly - 3 sets - 2 reps - 30 sec hold  GOALS: Goals reviewed with patient? Initiated goal review with patient, further review to be completed with testing next visit    SHORT TERM GOALS: Target date: 08/21/2022   Patient will be independent in home exercise program to improve strength/mobility for better functional independence with ADLs. Baseline: Goal status: INITIAL   LONG TERM GOALS: Target date: 10/02/2022    Patient will increase FOTO score to equal to or greater than 68 to demonstrate s improvement in mobility and quality of life.  Baseline: 62 Goal status: INITIAL  2.  Patient will increase ABC scale score >80% to demonstrate better functional mobility and better confidence with ADLs.  Baseline: 84.4% Goal status: INITIAL  3.  Patient will increase dynamic gait index score to >19/24 as to demonstrate reduced fall risk and improved dynamic gait balance for better safety with community/home ambulation.   Baseline: to be completed next 1-2 visits Goal status: INITIAL  4. The pt will ambulate at least 1.0 m/s with LRD on 10MWT to indicate functional community ambulation.  Baseline: to be completed next 1-2 visits.  Goal status: INITIAL    ASSESSMENT:  CLINICAL IMPRESSION: Patient is a 67 y.o. male who was seen today for physical therapy evaluation and treatment for dizziness.  Exam reveals possible abnormality with smooth pursuit testing and abnormal DVA performance. PT found to have instant onset of jerk-nystagmus that was downbeating and torsional to  R lasting only seconds, and this was observed to be much weaker upon testing L side. Hx and findings are suggestive of possible mixed central and peripheral contributors to pt dizziness. HIT and orthostatics to be assessed future session as well. Pt initiated seated VORx1 HEP today. The pt will benefit from further skilled PT to address these deficits in order to improve balance and decrease movement-triggered dizziness and imbalance.    OBJECTIVE IMPAIRMENTS: Abnormal gait, decreased balance, decreased mobility, difficulty walking, dizziness, impaired sensation, improper body mechanics, and pain.   ACTIVITY LIMITATIONS: bending, squatting, stairs, reach over head, and locomotion level  PARTICIPATION LIMITATIONS: meal prep, cleaning, shopping, and community activity  PERSONAL FACTORS: Age, Fitness, Past/current experiences, Time since onset of injury/illness/exacerbation, and 3+ comorbidities: PMH significant for the following: arthritis, CHF, coronary artery disease, DDD, hypercholesteremia, HTN, L foot drop, NSTEMI, OSA on CPAP, postlaminectomy syndrome S/P CAB x2 2016, DMII, low back pain, lumbar radiculopathy, vertigo, unstable angina, BPPV, L hip pain, AKI, Vitamin D deficiency, pain in joint of L knee, lateral epicondylitis, please refer to chart for full details.   are also affecting patient's functional outcome.   REHAB POTENTIAL: Good  CLINICAL DECISION MAKING: Evolving/moderate complexity  EVALUATION COMPLEXITY: Moderate     PLAN:  PT FREQUENCY: 2x/week  PT DURATION: 12 weeks  PLANNED INTERVENTIONS: Therapeutic exercises, Therapeutic activity, Neuromuscular re-education, Balance training, Gait training, Patient/Family education, Self Care, Joint mobilization, Stair training, Vestibular training, Canalith  repositioning, Visual/preceptual remediation/compensation, Orthotic/Fit training, DME instructions, Wheelchair mobility training, Spinal mobilization, Cryotherapy, Moist heat, Splintting, Taping, Manual therapy, and Re-evaluation  PLAN FOR NEXT SESSION: complete further assessment, DGI, orthostatics, head impulse testing, VOR, balance, positional reassessment with trial of CRM as indicated   Zollie Pee, PT 07/10/2022, 9:48 AM

## 2022-07-13 NOTE — Progress Notes (Unsigned)
Cardiology Office Note  Date:  07/14/2022   ID:  Jonathon Newman, DOB Apr 14, 1956, MRN [id number]  PCP:  Jonathon Sprout, FNP   Chief Complaint  Patient presents with   3 month follow up     Patient c/o shortness of breath with over exertion. Medications reviewed by the patient verbally.     HPI:  [name] is a 67 year old gentleman with past medical history of coronary artery disease  chronic diastolic heart failure.   Jonathon Newman placement to the RCA in 2010.   two-vessel coronary artery bypass graft in 2016 at [facility].   Hyperlipidemia Chronic dizziness date back to 2019 Chronic shortness of breath dating back several years OSA, wears CPAP Diabetes type 2 cardiac catheterization November 2019,  medical management  Who presents for follow-up of his chronic diastolic CHF, CAD  Last seen by myself in clinic May 2023 Seen by one of our providers October 2023  Echo in 11/2021 demonstrated an EF of 55 to 60%, no regional wall motion abnormalities, grade 2 diastolic dysfunction, normal RV systolic function with mildly enlarged ventricular cavity size, trivial aortic insufficiency, and an estimated right atrial pressure of 3 mmHg.    seen in the ED in 12/2021 for elevated blood pressure and abnormal renal function (serum creatinine had trended to 2.62 with a BUN of 82).  Note indicates his PCP had taken him off his diuretic.  BP was in the 413K systolic.  High-sensitivity troponin normal x2.  BNP 73.  Chest x-ray showed no active disease.      [facility] on 02/10/2022 noting worsening dizziness that was "present all the time."  Dizziness was worse if he looked up or bent over.   MRI of the brain in 05/2016, 03/2020, and 02/2021 showed no acute intracranial abnormality with findings of mild chronic small vessel ischemia and generalized volume loss noted.   Lexiscan MPI showed no significant ischemia with an EF of 62% and was overall low risk.   Outpatient  cardiac monitoring showed a predominant rhythm of sinus with an average rate of 73 bpm (range 48 to 118 bpm, 2 episodes of SVT with the fastest and longest interval lasting just 4 beats with a maximal rate of 118 bpm, frequent PACs representing an 8% burden, rare PVCs, and patient triggered events corresponding with sinus rhythm and rare PAC.   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 Stays busy on his property, building a shed  Takes torsemide 40 mg daily, occasionally with 20 mg in the afternoon High fluid intake  Recently signed up for [employer], would like to start exercise program and lose weight  EKG personally reviewed by myself on todays visit Normal sinus rhythm rate 71 bpm no significant ST-T wave changes  Spinal cord stimulator December 2022 Took out temp stimulator, 05/26/21 scheduled for permanent stimulator  GERD on PPI  Lab work reviewed Acute renal failure 02/14/21,  above baseline Hemoglobin A1c 6.4 in September 2022 Total cholesterol 146 LDL 22 triglycerides elevated 775 Creatinine improved 1.28, BUN 27  Other past medical hx reviewed Chronic SOB, rare chest and jaw pain, Chronic dizziness, dating back several years  stress test was ordered Oct 18, 2019  low risk study no significant ischemia ejection fraction 52%  cardiac catheterization November 2019  medical management was recommended  having symptoms  of chest discomfort at the time  diagnostic R/LHC on 05/02/2018 that showed significant underlying left main disease with patent grafts including LIMA to LAD and SVG to OM2. The RCA Newman was patent with minimal restenosis. He had normal LVSF with a mildly elevated LVEDP at 14-15 mmHg.   He is wearing his C-PAP.  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  PSH:    Past Surgical History:  Procedure Laterality Date   APPENDECTOMY     BACK SURGERY  11/01/2017   SL 5 and S1   CARDIAC CATHETERIZATION     CARPAL TUNNEL RELEASE     left hand   CHOLECYSTECTOMY     CHOLECYSTECTOMY     CORONARY ARTERY BYPASS GRAFT  04/21/2015   CABG x 2  V.A. LIMA to LAD amd SVG to OM2   CORONARY Newman PLACEMENT  2010   Cordis Jonathon Sirolimus-eluting Newman 2.50 mm x 26 mm placed to the RCA at [facility] N/A 08/04/2017   Procedure: ESOPHAGEAL MANOMETRY (EM);  Surgeon: Jonathon Landsman, MD;  Location: [facility] ENDOSCOPY;  Service: Endoscopy;  Laterality: N/A;   ESOPHAGOGASTRODUODENOSCOPY (EGD) WITH PROPOFOL N/A 06/17/2022   Procedure: ESOPHAGOGASTRODUODENOSCOPY (EGD) WITH PROPOFOL;  Surgeon: Jonathon Bellows, MD;  Location: [facility] ENDOSCOPY;  Service: Gastroenterology;  Laterality: N/A;   FRACTURE SURGERY     KNEE SURGERY     KNEE SURGERY     right knee    LAMINECTOMY     "plate in neck Z3-Y8"   LEFT HEART CATH AND CORS/GRAFTS ANGIOGRAPHY N/A 05/02/2018   Procedure: CORS/GRAFTS ANGIOGRAPHY;  Surgeon: Jonathon Hampshire, MD;  Location: [facility];  Service: Cardiovascular;  Laterality: N/A;   RIGHT/LEFT HEART CATH AND CORONARY ANGIOGRAPHY Bilateral 05/02/2018   Procedure: LEFT HEART CATH;  Surgeon: Jonathon Hampshire, MD;  Location: [facility];  Service: Cardiovascular;  Laterality: Bilateral;   THORACIC LAMINECTOMY FOR SPINAL CORD STIMULATOR N/A 05/26/2021   Procedure: THORACIC SPINAL CORD STIMULATOR AND  PULSE GENERATOR PLACEMENT (MEDTRONIC);  Surgeon: Jonathon Perla, MD;  Location: [facility] ORS;  Service: Neurosurgery;  Laterality: N/A;   VASECTOMY      Current Outpatient Medications  Medication Sig Dispense Refill   acetaminophen (TYLENOL) 500 MG tablet Take 1,000 mg by mouth every 8 (eight) hours as needed for mild pain or moderate pain. Takes once a day     aspirin EC 81 MG tablet Take 81 mg by mouth daily. Swallow whole.     atorvastatin (LIPITOR) 40 MG tablet Take 1 tablet (40 mg total) by mouth daily. 90 tablet 3   Blood Glucose Monitoring Suppl (FREESTYLE LITE) DEVI To check blood sugar once daily 1 each 0   carvedilol (COREG) 25 MG tablet Take 1 tablet (25 mg total) by mouth 2 (two) times daily with a meal. 180 tablet 3   clobetasol (TEMOVATE) 0.05 % external solution Apply 1 application topically 2 (two) times daily. 100 mL 1   ezetimibe (ZETIA) 10 MG tablet TAKE ONE TABLET BY MOUTH DAILY 90 tablet 3   FREESTYLE LITE test strip USE TO CHECK BLOOD SUGAR ONCE DAILY 100  strip 3   isosorbide mononitrate (IMDUR) 60 MG 24 hr tablet TAKE 1 TABLET DAILY 90 tablet 3   ketoconazole (NIZORAL) 2 % shampoo Apply 1 application topically 2 (two) times a week. 120 mL 1   Lancets (FREESTYLE) lancets USE TO CHECK BLOOD SUGAR ONCE DAILY 100 each 4   metFORMIN (GLUCOPHAGE-XR) 500 MG 24 hr tablet TAKE 2 TABLETS TWICE A DAY WITH MEALS 120 tablet 1   Multiple Vitamin (MULTIVITAMIN WITH MINERALS) TABS tablet Take 1 tablet by mouth daily.     nitroGLYCERIN (NITROSTAT) 0.4 MG SL tablet Place 1 tablet (0.4 mg total) under the tongue every 5 (five) minutes as needed for chest pain. 25 tablet 3   pantoprazole (PROTONIX) 40 MG tablet Take 1 tablet (40 mg total) by mouth daily. 90 tablet 3   potassium chloride (KLOR-CON) 10 MEQ tablet Take 2 tablets (20 mEq total) by mouth 2 (two) times daily. 360 each 1   pregabalin (LYRICA) 150 MG capsule TAKE 1 CAPSULE BY MOUTH TWO TIMES A DAY 180 capsule 3   sacubitril-valsartan  (ENTRESTO) 24-26 MG Take 1 tablet by mouth 2 (two) times daily.     senna (SENOKOT) 8.6 MG TABS tablet Take 1 tablet by mouth daily.     torsemide (DEMADEX) 20 MG tablet Take 40 mg by mouth daily.     TRULICITY 1.5 MG/0.5ML SOPN INJECT 1.5 MG UNDER THE SKIN ONCE A WEEK 6 mL 3   meclizine (ANTIVERT) 12.5 MG tablet Take 12.5 mg by mouth 2 (two) times daily. (Patient not taking: Reported on 07/14/2022)     No current facility-administered medications for this visit.    Allergies:   Colchicine and Lisinopril   Social History:  The patient  reports that he quit smoking about 17 years ago. His smoking use included cigarettes. He has a 0.50 pack-year smoking history. He has quit using smokeless tobacco.  His smokeless tobacco use included chew. He reports that he does not drink alcohol and does not use drugs.   Family History:   family history includes Arthritis in his mother; Cancer in his maternal grandfather; Cataracts in his maternal grandmother; Glaucoma in his maternal grandmother; Heart attack in his paternal grandfather; Heart attack (age of onset: 76) in his father; Heart murmur in his brother; Hyperlipidemia in his father; Hypertension in his father and paternal grandfather; Valvular heart disease in his brother.    Review of Systems: Review of Systems  Constitutional: Negative.   HENT: Negative.    Respiratory: Negative.    Cardiovascular: Negative.   Gastrointestinal: Negative.   Musculoskeletal: Negative.   Neurological: Negative.   Psychiatric/Behavioral: Negative.    All other systems reviewed and are negative.   PHYSICAL EXAM: VS:  BP 120/60 (BP Location: Left Arm, Patient Position: Sitting, Cuff Size: Normal)   Pulse 71   Ht 5\' 7"  (1.702 m)   Wt 233 lb (105.7 kg)   SpO2 96%   BMI 36.49 kg/m  , BMI Body mass index is 36.49 kg/m. Constitutional:  oriented to person, place, and time. No distress.  HENT:  Head: Grossly normal Eyes:  no discharge. No scleral icterus.   Neck: No JVD, no carotid bruits  Cardiovascular: Regular rate and rhythm, no murmurs appreciated Pulmonary/Chest: Clear to auscultation bilaterally, no wheezes or rails Abdominal: Soft.  no distension.  no tenderness.  Musculoskeletal: Normal range of motion Neurological:  normal muscle tone. Coordination normal. No atrophy Skin: Skin warm and dry Psychiatric: normal affect, pleasant  Recent Labs: 01/06/2022:  B Natriuretic Peptide 73.0; Hemoglobin 12.0; Platelets 161 01/09/2022: ALT 78 02/10/2022: BUN 30; Creatinine, Ser 1.29; Magnesium 1.7; Potassium 3.9; Sodium 141    Lipid Panel Lab Results  Component Value Date   CHOL 106 11/06/2021   HDL 27 (L) 11/06/2021   LDLCALC UNABLE TO CALCULATE IF TRIGLYCERIDE OVER 400 mg/dL 38/03/1750   TRIG 025 (H) 11/06/2021      Wt Readings from Last 3 Encounters:  07/14/22 233 lb (105.7 kg)  06/17/22 235 lb (106.6 kg)  06/02/22 220 lb (99.8 kg)     ASSESSMENT AND PLAN:  Acute on chronic diastolic congestive heart failure (HCC) -  On torsemide 40 daily, sometimes taking extra torsemide 20 at lunch for leg swelling Appears euvolemic Recent echo and stress test 6 months ago with normal LV function, no high risk ischemia  Dizziness No significant sx Active on his property, appears euvolemic  GERD Continue on PPI  Coronary artery disease involving native coronary artery of native heart with angina pectoris (HCC) Currently with no symptoms of angina. No further workup at this time. Continue current medication regimen. No unstable angina symptoms Has had prior catheterization 2019  S/P coronary artery bypass graft x 2  Stress test as above, low risk Prior cardiac catheterization No unstable angina  Hyperlipidemia, unspecified hyperlipidemia type Continue Lipitor 40 daily,  goal LDL less than 70 No changes  Essential hypertension Blood pressure is well controlled on today's visit. No changes made to the medications.  Chronic  dizziness Prior ENT work-up in the past  Prior MRI head 2017 no acute findings  Diabetes type 2 A1C improved into the low 6 range Reports he is joining [employer] to start exercise program    Total encounter time more than 30 minutes  Greater than 50% was spent in counseling and coordination of care with the patient     No orders of the defined types were placed in this encounter.    Signed, Dossie Arbour, M.D., Ph.D. 07/14/2022  [facility] [place], [place] [phone]

## 2022-07-14 ENCOUNTER — Encounter: Payer: Self-pay | Admitting: Cardiovascular Disease

## 2022-07-14 ENCOUNTER — Ambulatory Visit: Payer: Medicare PPO | Attending: Cardiovascular Disease | Admitting: Cardiovascular Disease

## 2022-07-14 VITALS — BP 120/60 | HR 71 | Ht 67.0 in | Wt 233.0 lb

## 2022-07-14 DIAGNOSIS — E1151 Type 2 diabetes mellitus with diabetic peripheral angiopathy without gangrene: Secondary | ICD-10-CM | POA: Diagnosis not present

## 2022-07-14 DIAGNOSIS — R42 Dizziness and giddiness: Secondary | ICD-10-CM

## 2022-07-14 DIAGNOSIS — I25118 Atherosclerotic heart disease of native coronary artery with other forms of angina pectoris: Secondary | ICD-10-CM

## 2022-07-14 DIAGNOSIS — I5032 Chronic diastolic (congestive) heart failure: Secondary | ICD-10-CM

## 2022-07-14 DIAGNOSIS — E785 Hyperlipidemia, unspecified: Secondary | ICD-10-CM

## 2022-07-14 DIAGNOSIS — I10 Essential (primary) hypertension: Secondary | ICD-10-CM

## 2022-07-14 NOTE — Patient Instructions (Addendum)
Medication Instructions:  ?No changes ? ?If you need a refill on your cardiac medications before your next appointment, please call your pharmacy.  ? ?Lab work: ?No new labs needed ? ?Testing/Procedures: ?No new testing needed ? ?Follow-Up: ?At [facility], you and your health needs are our priority.  As part of our continuing mission to provide you with exceptional heart care, we have created designated Provider Care Teams.  These Care Teams include your primary Cardiologist (physician) and Advanced Practice Providers (APPs -  Physician Assistants and Nurse Practitioners) who all work together to provide you with the care you need, when you need it. ? ?You will need a follow up appointment in 6 months ? ?Providers on your designated Care Team:   ?Christopher Berge, NP ?Ryan Dunn, PA-C ?Cadence Furth, PA-C ? ?COVID-19 Vaccine Information can be found at: [email or web address] For questions related to vaccine distribution or appointments, please email [email or web address] or call [phone].  ? ?

## 2022-07-15 ENCOUNTER — Ambulatory Visit: Payer: Medicare PPO

## 2022-07-15 DIAGNOSIS — R42 Dizziness and giddiness: Secondary | ICD-10-CM

## 2022-07-15 DIAGNOSIS — M6281 Muscle weakness (generalized): Secondary | ICD-10-CM

## 2022-07-15 DIAGNOSIS — M542 Cervicalgia: Secondary | ICD-10-CM

## 2022-07-15 DIAGNOSIS — R262 Difficulty in walking, not elsewhere classified: Secondary | ICD-10-CM | POA: Diagnosis not present

## 2022-07-15 DIAGNOSIS — R2681 Unsteadiness on feet: Secondary | ICD-10-CM | POA: Diagnosis not present

## 2022-07-15 NOTE — Therapy (Signed)
OUTPATIENT PHYSICAL THERAPY VESTIBULAR TREATMENT NOTE     Patient Name: Jonathon Newman MRN: [id number] DOB:03/05/1956, 67 y.o., male Today's Date: 07/15/2022  END OF SESSION:  PT End of Session - 07/15/22 1006     Visit Number 2    Number of Visits 25    Date for PT Re-Evaluation 10/01/22    PT Start Time 1016    PT Stop Time 1056    PT Time Calculation (min) 40 min    Equipment Utilized During Treatment Gait belt    Activity Tolerance Patient tolerated treatment well    Behavior During Therapy WFL for tasks assessed/performed             Past Medical History:  Diagnosis Date   Anginal pain (HCC)    Arthritis    BPH (benign prostatic hyperplasia)    CHF (congestive heart failure) (HCC)    Coronary artery disease 2010   a.) LHC 2010: high grade RCA stenosis -> 2.5 x 33mm Cypher DES to RCA. b.) NSTEMI 2016 -> PCI revealed pat RCA stent; sig dLM Dz with FFR ratio 0.76 and oLCx stenosis; ref to CVTS. c.) 2v CABG 04/21/2015; LIMA-LAD, SVG-OM2. d.) [facility] 05/02/2018: EF 55-65%; LVEDP 14-15 mmHg; 10% p-m RCA, 30% pRCA, 20% dRCA, 95% o-pLCx, 80% m-dLM; LIMA graft pat. SVG graft with mild diffuse Dz.   DDD (degenerative disc disease), lumbar    Dyspnea    GERD (gastroesophageal reflux disease)    Hypercholesteremia    Hypertension    Left foot drop    NSTEMI (non-ST elevated myocardial infarction) (HCC) 2016   a.) PCI revealed patent RCA stent; significant dLM disease with FFR ratio 0.76 and oLCx stenosis; refer to CVTS for CABG.   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 LIMA to LAD amd SVG  to OM2   CORONARY STENT PLACEMENT  2010   Cordis Cypher Sirolimus-eluting stent 2.50 mm x 26 mm placed to the RCA at [facility]    ESOPHAGEAL MANOMETRY N/A 08/04/2017   Procedure: ESOPHAGEAL MANOMETRY (EM);  Surgeon: Toney Reil, MD;  Location: [facility] ENDOSCOPY;  Service: Endoscopy;  Laterality: N/A;   ESOPHAGOGASTRODUODENOSCOPY (EGD) WITH PROPOFOL N/A 06/17/2022   Procedure: ESOPHAGOGASTRODUODENOSCOPY (EGD) WITH PROPOFOL;  Surgeon: [name], MD;  Location: [facility] ENDOSCOPY;  Service: Gastroenterology;  Laterality: N/A;   FRACTURE SURGERY     KNEE SURGERY     KNEE SURGERY     right knee    LAMINECTOMY     "plate in neck N8-G9"   LEFT HEART CATH AND CORS/GRAFTS ANGIOGRAPHY N/A 05/02/2018   Procedure: CORS/GRAFTS ANGIOGRAPHY;  Surgeon: Iran Ouch, MD;  Location: [facility] INVASIVE CV LAB;  Service: Cardiovascular;  Laterality: N/A;   RIGHT/LEFT HEART CATH AND CORONARY ANGIOGRAPHY Bilateral 05/02/2018   Procedure: LEFT HEART CATH;  Surgeon: Iran Ouch, MD;  Location: [facility] INVASIVE CV LAB;  Service: Cardiovascular;  Laterality: Bilateral;   THORACIC LAMINECTOMY FOR SPINAL CORD STIMULATOR  N/A 05/26/2021   Procedure: THORACIC SPINAL CORD STIMULATOR AND PULSE GENERATOR PLACEMENT (MEDTRONIC);  Surgeon: Lucy Chris, MD;  Location: [facility] ORS;  Service: Neurosurgery;  Laterality: N/A;   VASECTOMY     Patient Active Problem List   Diagnosis Date Noted   CAD (coronary artery disease) 06/01/2022   Vitamin D deficiency 06/01/2022   Snoring 06/01/2022   Other specified counseling 06/01/2022   Lateral epicondylitis (tennis elbow) 06/01/2022   History of heartburn 06/01/2022   Contact dermatitis 06/01/2022   Chronic pharyngitis 06/01/2022   Abdominal tenderness, right upper quadrant 06/01/2022   Pain in joint, forearm 06/01/2022   Rash and other nonspecific skin eruption 06/01/2022   Pain in joint of left knee 04/20/2022   Swelling of limb 01/23/2022   3+ pitting  edema 01/09/2022   AKI (acute kidney injury) (HCC) 01/09/2022   Left hip pain 12/19/2021   Chronic diastolic CHF (congestive heart failure) (HCC) 12/19/2021   Localized edema 12/19/2021   Morton's neuroma of both feet 07/01/2021   DDD (degenerative disc disease), lumbosacral 07/01/2021   Psoriasis of scalp 07/01/2021   Essential hypertension 03/31/2021   Type 2 diabetes mellitus with diabetic peripheral angiopathy without gangrene, without long-term current use of insulin (HCC) 03/31/2021   History of recent fall 03/31/2021   Foot drop, left 03/31/2021   BMI 33.0-33.9,adult 03/31/2021   Hyponatremia 02/14/2021   Hyperlipidemia associated with type 2 diabetes mellitus (HCC) 12/27/2020   Elevated transaminase level 10/04/2020   Morbid obesity (HCC) 05/20/2020   T2DM (type 2 diabetes mellitus) (HCC) 07/17/2019   Strain of foot 05/09/2019   Benign paroxysmal positional vertigo due to bilateral vestibular disorder 03/15/2019   Abnormal findings on diagnostic imaging of lung 03/15/2019   Chest pain 03/10/2019   Pain in right knee 09/10/2018   Unstable angina (HCC) 04/25/2018   SOB (shortness of breath) 04/13/2018   Vertigo 04/13/2018   S/P coronary artery bypass graft x 2    Incomplete bladder emptying 07/21/2017   Low back pain 07/09/2017   Lumbar radiculopathy 07/09/2017   Gastroesophageal reflux disease with esophagitis 07/05/2017   Congestive heart failure (HCC) 07/05/2017   Benign prostatic hyperplasia with weak urinary stream 07/05/2017   Dysphagia 07/05/2017   DDD (degenerative disc disease), lumbar 07/05/2017   Hypertension associated with type 2 diabetes mellitus (HCC) 07/31/2015   OSA (obstructive sleep apnea) 07/31/2015    PCP: Jacky Kindle, FNP  REFERRING PROVIDER: Morene Crocker, MD   REFERRING DIAG: R42 (ICD-10-CM) - Dizziness   THERAPY DIAG:  Dizziness and giddiness  Cervicalgia  Muscle weakness (generalized)  Difficulty in walking, not elsewhere  classified  ONSET DATE: Reports onset of approximately 1 year ago (2023)  Rationale for Evaluation and Treatment: Rehabilitation  SUBJECTIVE:   SUBJECTIVE STATEMENT: Pt reports doing HEP for a couple days until he got food poisoning. He is feeling better now but was unable to perform HEP because at the time it increased his symptoms and headache. He reports some foot pain currently and rates it 8/10. Pt reports he has gout pain in R foot.  Pt accompanied by: self  PERTINENT HISTORY:   Pt is a 67 y/o male presenting to PT for chronic dizziness. He is using a SPC to ambulate. He reports onset over a year ago. Pt states "there's days it's just crazy, and days I can do it." Dizziness can start 5-10 minutes after lying down. Dizziness occurs with sit>stand. To be careful, he will sit for 2-3 minutes prior to  standing. Pt can experience spinning while ambulating. He reports nature of dizziness is typically a spinning sensation that lasts for 15-20 seconds. Frequency of dizziness is random. Most recent episode was this past Tuesday when lying down. Reports he can go a week before having another episode. Rolling over does not cause spinning, tilting his head back causes him to feel unsteady. He reports he has tried canalith repositioning maneuvers in past without success. Pt did have migraines when younger (30 years ago), seldom gets headaches now. Pt does take aspirin for his heart and Tyelenol (hx of CAB x2, 2016). Pt also had stimulator placed (thoracic, 2022) due to back problems that caused difficulty walking and maintaining balance. Pt reports hx LE weakness but no hx of stroke. Pt has fallen multiple times and 2x in past six months. He denies hearing loss, does have B tinnitus (low frequency), and reports no ear pain. He reports no unusual changes in vision. Pt can feel lightheaded multiple times/month, says it is random. 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 He has difficulty  navigating stairs. 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 PMH significant for the following: arthritis, CHF, coronary artery disease, DDD, hypercholesteremia, HTN, L foot drop, NSTEMI, OSA on CPAP, postlaminectomy syndrome S/P CAB x2 2016, DMII, low back pain, lumbar radiculopathy, vertigo, BPPV, L hip pain, AKI, Vitamin D deficiency, pain in joint of L knee, lateral epicondylitis, please refer to chart for full details.   PAIN: From eval Are you having pain? Yes: NPRS scale: not rated/10 Pain location: back pain Pain description:   Aggravating factors:   Relieving factors: has stimulator   PRECAUTIONS: Fall  WEIGHT BEARING RESTRICTIONS: No  FALLS: Has patient fallen in last 6 months? Yes. Number of falls 2, no head impact or injuries with falls  LIVING ENVIRONMENT: Lives with:  Lives in:  Stairs:  Has following equipment at home:   PLOF: Independent  PATIENT GOALS: reduce dizziness  OBJECTIVE: taken at eval unless specified otherwise  DIAGNOSTIC FINDINGS:   PER referral Potter: "Reviewed head CT and carotid ultrasound, which were within normal limits."  CT HEAD WO CONTRAST 03/04/2022 " IMPRESSION: No acute intracranial abnormality."  MR Cervical spine WO contrast 01/06/2021" IMPRESSION: At C6-C7, there has been prior ACDF. There is fusion across the disc space. No significant spinal canal or foraminal stenosis.   Cervical spondylosis, as outlined. No more than mild spinal canal stenosis. Multilevel neural foraminal narrowing, greatest on the left at C3-C4 (moderate), on the left at C4-C5 (moderate) and bilaterally at C5-C6 (moderate right, moderate/severe left).   Also of note, there is a small right-sided facet joint effusion at C4-C5."  MR Brain WO contrast 02/14/2021 "IMPRESSION: 1. No acute intracranial abnormality. 2. Findings of mild chronic small vessel ischemia and generalized volume loss."   COGNITION: Overall cognitive status:  Within functional limits for tasks assessed   SENSATION: Impaired, pt reports neuropathy that affects feet bilat   POSTURE:  rounded shoulders  Cervical ROM:   07/15/2022:  Cervical rotation  R: 75 deg, feels pain on L L: 70 deg, feels pain on L  STRENGTH:  07/15/2022 LE MMT: 4+/5    TRANSFERS: Assistive device utilized: Single point cane  Sit to stand: Modified independence Stand to sit: Modified independence   GAIT: Gait pattern:  observed decreased gait speed (further assessment to be completed future session), pt ambulates with SPC Distance walked: clinic distances Assistive device utilized: Single point cane   FUNCTIONAL TESTS:  07/15/2022 DGI - deferred 10MWT - 0.81 m/s no AD  PATIENT SURVEYS:  FOTO 62 ABC Scale: 84.4%  VESTIBULAR ASSESSMENT:  GENERAL OBSERVATION: Relies on SPC for balance, cautious ambulator (gait speed to be formally assessed future visit)   SYMPTOM BEHAVIOR:  Subjective history: Pt describes primarily a spinning dizziness and at times a lightheadedness. Symptom onset >1 year ago. Positionally triggered and also random.  Non-Vestibular symptoms: headaches and tinnitus  Type of dizziness: Spinning/Vertigo and Lightheadedness/Faint  Frequency: Pt reports is mostly random  Duration: seconds  Aggravating factors: Spontaneous and Induced by position change: also reports occurs when ambulating   OCULOMOTOR EXAM:  Ocular Alignment: normal  Ocular ROM: No Limitations  Spontaneous Nystagmus: absent  Gaze-Induced Nystagmus: absent  Smooth Pursuits: majority of movement appears smooth, however, few possible saccadic intrusions noted  Saccades: intact  Convergence/Divergence: at 6" sees double   VESTIBULAR - OCULAR REFLEX:   Slow VOR:   VOR Cancellation:   Head-Impulse Test: deferred  Dynamic Visual Acuity: Abnormal, line change 10 to 7, however, no dizziness with testing   POSITIONAL TESTING:    Dix-Hallpike  -R side: instant onset  of jerk nystagmus, downbeat and R torsional lasting <10 sec -L side: very weak, subtle jerk nystagmus also downbeat appears torsional to R side  Roll testing: negative bilaterally Pt reports headache rated as 4/10 following treatment. He reports his headaches can last all day or an hour.   OTHOSTATICS: deferred  FUNCTIONAL GAIT: Dynamic Gait Index: to be complete next 1-2 visits    VESTIBULAR TREATMENT:                                                                                                   DATE:   Dix-Hallpike R: onset of jerk nystagmus, torsional and upbeating to R, <10 seconds, pt reported spinning sensation. This fatigued and nystagmus ceased. However, a few seconds later pt reported new onset of spinning this time with noted onset of down-beat torsional nystagmus <30 sec. Dix-Hallpike R: No nystagmus noted (retested following first R Epley). Pt did report brief spinning with first position of second Epley maneuver  Epley 2x to treat R side.  Provided informational packet from [employer] regarding what to expect following maneuver.   10MWT: 0.81 m/s no AD, noted decreased arm-swing bilat and R hip drop  LE MMT: BLE grossly 4+/5   PATIENT EDUCATION: Education details: further assessment findings, indications, maneuvers, post-maneuver precautions Person educated: Patient Education method: Explanation, Demonstration, Tactile cues, Verbal cues, and Handouts Education comprehension: verbalized understanding and returned demonstration  HOME EXERCISE PROGRAM: Pt to continue HEP as previously given within symptom tolerance  Access Code: [email or web address] URL: [email or web address] Date: 07/09/2022 Prepared by: Ricard Dillon  Exercises - Seated Gaze Stabilization with Head Rotation  - 1 x daily - 7 x weekly - 3 sets - 2 reps - 30 sec hold  GOALS: Goals reviewed with patient? Initiated goal review with patient, further review to be  completed with testing next visit    SHORT TERM GOALS: Target date: 08/26/2022   Patient will be independent  in home exercise program to improve strength/mobility for better functional independence with ADLs. Baseline: Goal status: INITIAL   LONG TERM GOALS: Target date: 10/07/2022    Patient will increase FOTO score to equal to or greater than 68 to demonstrate s improvement in mobility and quality of life.  Baseline: 62 Goal status: INITIAL  2.  Patient will increase ABC scale score >80% to demonstrate better functional mobility and better confidence with ADLs.  Baseline: 84.4% Goal status: INITIAL  3.  Patient will increase dynamic gait index score to >19/24 as to demonstrate reduced fall risk and improved dynamic gait balance for better safety with community/home ambulation.   Baseline: to be completed next 1-2 visits Goal status: INITIAL  4. The pt will ambulate at least 1.0 m/s with LRD on 10MWT to indicate functional community ambulation.  Baseline: to be completed next 1-2 visits. 07/15/22: 0.81 m/s  Goal status: INITIAL    ASSESSMENT:  CLINICAL IMPRESSION: Further assessment completed. Pt found to have impaired gait speed/mechanics and BLE strength. Positional testing reassessed and pt noted to have positive R Epley with up-beat, R tosrional nystagmus that ceased, then with onset of downbeat torsional nystagmus (both lasted <30 sec) before pt brought out of testing position. PT provided R Epley 2x today with no nystagmus by second maneuver, but with mild dizziness/spinning reported at beginning of second maneuver. Will reassess next visit. The pt will benefit from further skilled PT to address these deficits in order to improve balance and decrease movement-triggered dizziness and imbalance.    OBJECTIVE IMPAIRMENTS: Abnormal gait, decreased balance, decreased mobility, difficulty walking, dizziness, impaired sensation, improper body mechanics, and pain.   ACTIVITY  LIMITATIONS: bending, squatting, stairs, reach over head, and locomotion level  PARTICIPATION LIMITATIONS: meal prep, cleaning, shopping, and community activity  PERSONAL FACTORS: Age, Fitness, Past/current experiences, Time since onset of injury/illness/exacerbation, and 3+ comorbidities: PMH significant for the following: arthritis, CHF, coronary artery disease, DDD, hypercholesteremia, HTN, L foot drop, NSTEMI, OSA on CPAP, postlaminectomy syndrome S/P CAB x2 2016, DMII, low back pain, lumbar radiculopathy, vertigo, unstable angina, BPPV, L hip pain, AKI, Vitamin D deficiency, pain in joint of L knee, lateral epicondylitis, please refer to chart for full details.   are also affecting patient's functional outcome.   REHAB POTENTIAL: Good  CLINICAL DECISION MAKING: Evolving/moderate complexity  EVALUATION COMPLEXITY: Moderate     PLAN:  PT FREQUENCY: 2x/week  PT DURATION: 12 weeks  PLANNED INTERVENTIONS: Therapeutic exercises, Therapeutic activity, Neuromuscular re-education, Balance training, Gait training, Patient/Family education, Self Care, Joint mobilization, Stair training, Vestibular training, Canalith repositioning, Visual/preceptual remediation/compensation, Orthotic/Fit training, DME instructions, Wheelchair mobility training, Spinal mobilization, Cryotherapy, Moist heat, Splintting, Taping, Manual therapy, and Re-evaluation  PLAN FOR NEXT SESSION: complete further assessment, orthostatics, VOR, balance, positional reassessment with trial of CRM as indicated   Zollie Pee, PT 07/15/2022, 11:45 AM

## 2022-07-17 ENCOUNTER — Other Ambulatory Visit: Payer: Self-pay | Admitting: Cardiovascular Disease

## 2022-07-20 ENCOUNTER — Ambulatory Visit: Payer: Medicare PPO | Attending: Neurology

## 2022-07-20 DIAGNOSIS — R2681 Unsteadiness on feet: Secondary | ICD-10-CM | POA: Insufficient documentation

## 2022-07-20 DIAGNOSIS — M542 Cervicalgia: Secondary | ICD-10-CM | POA: Diagnosis not present

## 2022-07-20 DIAGNOSIS — R42 Dizziness and giddiness: Secondary | ICD-10-CM | POA: Diagnosis not present

## 2022-07-20 DIAGNOSIS — R262 Difficulty in walking, not elsewhere classified: Secondary | ICD-10-CM | POA: Insufficient documentation

## 2022-07-20 NOTE — Therapy (Signed)
OUTPATIENT PHYSICAL THERAPY VESTIBULAR TREATMENT NOTE     Patient Name: Jonathon Newman MRN: [id number] DOB:1956/04/30, 67 y.o., male Today's Date: 07/20/2022  END OF SESSION:  PT End of Session - 07/20/22 1535     Visit Number 3    Number of Visits 25    Date for PT Re-Evaluation 10/01/22    PT Start Time 1017    PT Stop Time 1053    PT Time Calculation (min) 36 min    Equipment Utilized During Treatment Gait belt    Activity Tolerance Patient tolerated treatment well;Other (comment)   pt feeling unwell, headache   Behavior During Therapy WFL for tasks assessed/performed              Past Medical History:  Diagnosis Date   Anginal pain ([place])    Arthritis    BPH (benign prostatic hyperplasia)    CHF (congestive heart failure) ([place])    Coronary artery disease 2010   a.) LHC 2010: high grade RCA stenosis -> 2.5 x 57mm Cypher DES to RCA. b.) NSTEMI 2016 -> PCI revealed pat RCA stent; sig dLM Dz with FFR ratio 0.76 and oLCx stenosis; ref to CVTS. c.) 2v CABG 04/21/2015; LIMA-LAD, SVG-OM2. d.) [facility] 05/02/2018: EF 55-65%; LVEDP 14-15 mmHg; 10% p-m RCA, 30% pRCA, 20% dRCA, 95% o-pLCx, 80% m-dLM; LIMA graft pat. SVG graft with mild diffuse Dz.   DDD (degenerative disc disease), lumbar    Dyspnea    GERD (gastroesophageal reflux disease)    Hypercholesteremia    Hypertension    Left foot drop    NSTEMI (non-ST elevated myocardial infarction) ([place]) 2016   a.) PCI revealed patent RCA stent; significant dLM disease with FFR ratio 0.76 and oLCx stenosis; refer to CVTS for CABG.   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 LIMA to LAD amd SVG to OM2   CORONARY STENT PLACEMENT  2010   Cordis Cypher Sirolimus-eluting stent 2.50 mm x 26 mm placed to the RCA at [place] N/A 08/04/2017   Procedure: ESOPHAGEAL MANOMETRY (EM);  Surgeon: Lin Landsman, MD;  Location: [facility] ENDOSCOPY;  Service: Endoscopy;  Laterality: N/A;   ESOPHAGOGASTRODUODENOSCOPY (EGD) WITH PROPOFOL N/A 06/17/2022   Procedure: ESOPHAGOGASTRODUODENOSCOPY (EGD) WITH PROPOFOL;  Surgeon: Jonathon Bellows, MD;  Location: [facility] ENDOSCOPY;  Service: Gastroenterology;  Laterality: N/A;   FRACTURE SURGERY     KNEE SURGERY     KNEE SURGERY     right knee    LAMINECTOMY     "plate in neck Y7-C6"   LEFT HEART CATH AND CORS/GRAFTS ANGIOGRAPHY N/A 05/02/2018   Procedure: CORS/GRAFTS ANGIOGRAPHY;  Surgeon: Wellington Hampshire, MD;  Location: [place] CV [facility];  Service: Cardiovascular;  Laterality: N/A;   RIGHT/LEFT HEART CATH AND CORONARY ANGIOGRAPHY Bilateral 05/02/2018   Procedure: LEFT HEART CATH;  Surgeon: Wellington Hampshire, MD;  Location: [place] [facility];  Service: Cardiovascular;  Laterality: Bilateral;  THORACIC LAMINECTOMY FOR SPINAL CORD STIMULATOR N/A 05/26/2021   Procedure: THORACIC SPINAL CORD STIMULATOR AND PULSE GENERATOR PLACEMENT ([employer]TRON[employer]);  Surgeon: Lucy Chris, MD;  Location: [facility] ORS;  Service: Neurosurgery;  Laterality: N/A;   VASECTOMY     Patient Active Problem List   Diagnosis Date Noted   CAD (coronary artery disease) 06/01/2022   Vitamin D deficiency 06/01/2022   Snoring 06/01/2022   Other specified counseling 06/01/2022   Lateral epicondylitis (tennis elbow) 06/01/2022   History of heartburn 06/01/2022   Contact dermatitis 06/01/2022   Chronic pharyngitis 06/01/2022   Abdominal tenderness, right upper quadrant 06/01/2022   Pain in joint, forearm 06/01/2022   Rash and other nonspecific skin eruption 06/01/2022   Pain in joint of left knee  04/20/2022   Swelling of limb 01/23/2022   3+ pitting edema 01/09/2022   AKI (acute kidney injury) (HCC) 01/09/2022   Left hip pain 12/19/2021   Chronic diastolic CHF (congestive heart failure) (HCC) 12/19/2021   Localized edema 12/19/2021   Morton's neuroma of both feet 07/01/2021   DDD (degenerative disc disease), lumbosacral 07/01/2021   Psoriasis of scalp 07/01/2021   Essential hypertension 03/31/2021   Type 2 diabetes mellitus with diabetic peripheral angiopathy without gangrene, without long-term current use of insulin (HCC) 03/31/2021   History of recent fall 03/31/2021   Foot drop, left 03/31/2021   BMI 33.0-33.9,adult 03/31/2021   Hyponatremia 02/14/2021   Hyperlipidemia associated with type 2 diabetes mellitus (HCC) 12/27/2020   Elevated transaminase level 10/04/2020   Morbid obesity (HCC) 05/20/2020   T2DM (type 2 diabetes mellitus) (HCC) 07/17/2019   Strain of foot 05/09/2019   Benign paroxysmal positional vertigo due to bilateral vestibular disorder 03/15/2019   Abnormal findings on diagnostic imaging of lung 03/15/2019   Chest pain 03/10/2019   Pain in right knee 09/10/2018   Unstable angina (HCC) 04/25/2018   SOB (shortness of breath) 04/13/2018   Vertigo 04/13/2018   S/P coronary artery bypass graft x 2    Incomplete bladder emptying 07/21/2017   Low back pain 07/09/2017   Lumbar radiculopathy 07/09/2017   Gastroesophageal reflux disease with esophagitis 07/05/2017   Congestive heart failure (HCC) 07/05/2017   Benign prostatic hyperplasia with weak urinary stream 07/05/2017   Dysphagia 07/05/2017   DDD (degenerative disc disease), lumbar 07/05/2017   Hypertension associated with type 2 diabetes mellitus (HCC) 07/31/2015   OSA (obstructive sleep apnea) 07/31/2015    PCP: Jacky Kindle, FNP  REFERRING PROVIDER: Morene Crocker, MD   REFERRING DIAG: R42 (ICD-10-CM) - Dizziness   THERAPY DIAG:  Dizziness and giddiness  Unsteadiness on feet  ONSET  DATE: Reports onset of approximately 1 year ago (2023)  Rationale for Evaluation and Treatment: Rehabilitation  SUBJECTIVE:   SUBJECTIVE STATEMENT: Pt reports blood sugar has been "all over the place" since food poisoning last week. He reports it has gotten somewhat better. He has been having headaches, rates current one as 4/10. Pt reports continued spinning/dizziness since last appointment. He reports even without moving he has been dizzy. Says it is worse with increased sugars. Pt reports no falls but some tripping when walking, thinks this is due to his drop-foot.  Pt reports blood sugar earlier was around 197 Pt accompanied by: self  PERTINENT HISTORY:   Pt is a 67 y/o male presenting to PT for chronic dizziness. He is using a SPC to ambulate. He reports onset over a year ago. Pt states "there's days it's just crazy, and days I can do it."  Dizziness can start 5-10 minutes after lying down. Dizziness occurs with sit>stand. To be careful, he will sit for 2-3 minutes prior to standing. Pt can experience spinning while ambulating. He reports nature of dizziness is typically a spinning sensation that lasts for 15-20 seconds. Frequency of dizziness is random. Most recent episode was this past Tuesday when lying down. Reports he can go a week before having another episode. Rolling over does not cause spinning, tilting his head back causes him to feel unsteady. He reports he has tried canalith repositioning maneuvers in past without success. Pt did have migraines when younger (30 years ago), seldom gets headaches now. Pt does take aspirin for his heart and Tyelenol (hx of CAB x2, 2016). Pt also had stimulator placed (thoracic, 2022) due to back problems that caused difficulty walking and maintaining balance. Pt reports hx LE weakness but no hx of stroke. Pt has fallen multiple times and 2x in past six months. He denies hearing loss, does have B tinnitus (low frequency), and reports no ear pain. He reports  no unusual changes in vision. Pt can feel lightheaded multiple times/month, says it is random. 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 He has difficulty navigating stairs. 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 PMH significant for the following: arthritis, CHF, coronary artery disease, DDD, hypercholesteremia, HTN, L foot drop, NSTEMI, OSA on CPAP, postlaminectomy syndrome S/P CAB x2 2016, DMII, low back pain, lumbar radiculopathy, vertigo, BPPV, L hip pain, AKI, Vitamin D deficiency, pain in joint of L knee, lateral epicondylitis, please refer to chart for full details.   PAIN: From eval Are you having pain? Yes: NPRS scale: not rated/10 Pain location: back pain Pain description:   Aggravating factors:   Relieving factors: has stimulator   PRECAUTIONS: Fall  WEIGHT BEARING RESTRICTIONS: No  FALLS: Has patient fallen in last 6 months? Yes. Number of falls 2, no head impact or injuries with falls  LIVING ENVIRONMENT: Lives with:  Lives in:  Stairs:  Has following equipment at home:   PLOF: Independent  PATIENT GOALS: reduce dizziness  OBJECTIVE: taken at eval unless specified otherwise  DIAGNOSTIC FINDINGS:   PER referral [place]: "Reviewed head CT and carotid ultrasound, which were within normal limits."  CT HEAD WO CONTRAST 03/04/2022 " IMPRESSION: No acute intracranial abnormality."  MR Cervical spine WO contrast 01/06/2021" IMPRESSION: At C6-C7, there has been prior ACDF. There is fusion across the disc space. No significant spinal canal or foraminal stenosis.   Cervical spondylosis, as outlined. No more than mild spinal canal stenosis. Multilevel neural foraminal narrowing, greatest on the left at C3-C4 (moderate), on the left at C4-C5 (moderate) and bilaterally at C5-C6 (moderate right, moderate/severe left).   Also of note, there is a small right-sided facet joint effusion at C4-C5."  MR Brain WO contrast 02/14/2021  "IMPRESSION: 1. No acute intracranial abnormality. 2. Findings of mild chronic small vessel ischemia and generalized volume loss."   COGNITION: Overall cognitive status: Within functional limits for tasks assessed   SENSATION: Impaired, pt reports neuropathy that affects feet bilat   POSTURE:  rounded shoulders  Cervical ROM:   07/15/2022:  Cervical rotation  R: 75 deg, feels pain on L L: 70 deg, feels pain on L  STRENGTH:  07/15/2022 LE MMT: 4+/5    TRANSFERS: Assistive device utilized: Single point cane  Sit to stand: Modified independence Stand to sit: Modified independence   GAIT: Gait pattern:  observed decreased gait speed (further assessment  to be completed future session), pt ambulates with SPC Distance walked: clinic distances Assistive device utilized: Single point cane   FUNCTIONAL TESTS:  07/15/2022 DGI - deferred 10MWT - 0.81 m/s no AD  PATIENT SURVEYS:  FOTO 62 ABC Scale: 84.4%  VESTIBULAR ASSESSMENT:  GENERAL OBSERVATION: Relies on SPC for balance, cautious ambulator (gait speed to be formally assessed future visit)   SYMPTOM BEHAVIOR:  Subjective history: Pt describes primarily a spinning dizziness and at times a lightheadedness. Symptom onset >1 year ago. Positionally triggered and also random.  Non-Vestibular symptoms: headaches and tinnitus  Type of dizziness: Spinning/Vertigo and Lightheadedness/Faint  Frequency: Pt reports is mostly random  Duration: seconds  Aggravating factors: Spontaneous and Induced by position change: also reports occurs when ambulating   OCULOMOTOR EXAM:  Ocular Alignment: normal  Ocular ROM: No Limitations  Spontaneous Nystagmus: absent  Gaze-Induced Nystagmus: absent  Smooth Pursuits: majority of movement appears smooth, however, few possible saccadic intrusions noted  Saccades: intact  Convergence/Divergence: at 6" sees double   VESTIBULAR - OCULAR REFLEX:   Slow VOR:   VOR Cancellation:    Head-Impulse Test: deferred  Dynamic Visual Acuity: Abnormal, line change 10 to 7, however, no dizziness with testing   POSITIONAL TESTING:    Dix-Hallpike  -R side: instant onset of jerk nystagmus, downbeat and R torsional lasting <10 sec -L side: very weak, subtle jerk nystagmus also downbeat appears torsional to R side  Roll testing: negative bilaterally Pt reports headache rated as 4/10 following treatment. He reports his headaches can last all day or an hour.   OTHOSTATICS: deferred  FUNCTIONAL GAIT: Dynamic Gait Index: to be complete next 1-2 visits    VESTIBULAR TREATMENT:                                                                                                   DATE:   TherAct BP prior to treatment (LUE, seated, resting): 156/97 mmHg, HR 69 bpm  BP (seated) taken after pt performs a few activities with DGI 155/94 mmHg HR 68 - reports increased headache, "throbbing" BP (seated) taken after a few minutes of rest: 146/90 mmHg HR 69 bpm. Pt continue to have headache although it has improved some with rest.  DGI: 17/24   Pt reports increased headache with DGI test. Further interventions discontinued today as a result. Nursing present and provides 3rd BP assessment (see above).  Pt reports he has a "water pill" he takes around noon (has not taken yet). He reports he has noted minor swelling in BLE and decreased urination. Pt with hx of CHF, does think he may be retaining extra fluid. PT and nursing recommend that if pt still feeling bad after taking his medications to contact his physician or go to urgent care. Pt verbalized understanding. Pt reports he feels well enough to ambulate out of clinic (pt exhibits steady gait).  Pt rests for several minutes throughout.   PATIENT EDUCATION: Education details: BP assessment, recommendations Person educated: Patient Education method: Explanation, Demonstration, Tactile cues, Verbal cues, and Handouts Education  comprehension: verbalized understanding and returned demonstration  HOME  EXERCISE PROGRAM: Pt to continue HEP as previously given within symptom tolerance - did instruct to not perform if pt continues to experience increased headache  Access Code: [email or web address] URL: [email or web address] Date: 07/09/2022 Prepared by: Ricard Dillon  Exercises - Seated Gaze Stabilization with Head Rotation  - 1 x daily - 7 x weekly - 3 sets - 2 reps - 30 sec hold  GOALS: Goals reviewed with patient? Initiated goal review with patient, further review to be completed with testing next visit    SHORT TERM GOALS: Target date: 08/31/2022   Patient will be independent in home exercise program to improve strength/mobility for better functional independence with ADLs. Baseline: Goal status: INITIAL   LONG TERM GOALS: Target date: 10/12/2022    Patient will increase FOTO score to equal to or greater than 68 to demonstrate s improvement in mobility and quality of life.  Baseline: 62 Goal status: INITIAL  2.  Patient will increase ABC scale score >80% to demonstrate better functional mobility and better confidence with ADLs.  Baseline: 84.4% Goal status: INITIAL  3.  Patient will increase dynamic gait index score to >19/24 as to demonstrate reduced fall risk and improved dynamic gait balance for better safety with community/home ambulation.   Baseline: to be completed next 1-2 visits Goal status: INITIAL  4. The pt will ambulate at least 1.0 m/s with LRD on 10MWT to indicate functional community ambulation.  Baseline: to be completed next 1-2 visits. 07/15/22: 0.81 m/s  Goal status: INITIAL    ASSESSMENT:  CLINICAL IMPRESSION: PT completes DGI and scores 17/24, indicating balance impairment and that pt is at increased risk for falls. Session limited today and pt monitored while resting due to increased HA symptoms, reports of fluctuating blood sugar, and high diastolic B, increased dizziness.  Please refer to recommendations provided above for pt safety and follow-up. The pt will benefit from further skilled PT to address these deficits in order to improve balance and decrease movement-triggered dizziness and imbalance.    OBJECTIVE IMPAIRMENTS: Abnormal gait, decreased balance, decreased mobility, difficulty walking, dizziness, impaired sensation, improper body mechanics, and pain.   ACTIVITY LIMITATIONS: bending, squatting, stairs, reach over head, and locomotion level  PARTICIPATION LIMITATIONS: meal prep, cleaning, shopping, and community activity  PERSONAL FACTORS: Age, Fitness, Past/current experiences, Time since onset of injury/illness/exacerbation, and 3+ comorbidities: PMH significant for the following: arthritis, CHF, coronary artery disease, DDD, hypercholesteremia, HTN, L foot drop, NSTEMI, OSA on CPAP, postlaminectomy syndrome S/P CAB x2 2016, DMII, low back pain, lumbar radiculopathy, vertigo, unstable angina, BPPV, L hip pain, AKI, Vitamin D deficiency, pain in joint of L knee, lateral epicondylitis, please refer to chart for full details.   are also affecting patient's functional outcome.   REHAB POTENTIAL: Good  CLINICAL DECISION MAKING: Evolving/moderate complexity  EVALUATION COMPLEXITY: Moderate     PLAN:  PT FREQUENCY: 2x/week  PT DURATION: 12 weeks  PLANNED INTERVENTIONS: Therapeutic exercises, Therapeutic activity, Neuromuscular re-education, Balance training, Gait training, Patient/Family education, Self Care, Joint mobilization, Stair training, Vestibular training, Canalith repositioning, Visual/preceptual remediation/compensation, Orthotic/Fit training, DME instructions, Wheelchair mobility training, Spinal mobilization, Cryotherapy, Moist heat, Splintting, Taping, Manual therapy, and Re-evaluation  PLAN FOR NEXT SESSION: complete further assessment, orthostatics, VOR, balance, positional reassessment with trial of CRM as indicated   Zollie Pee,  PT 07/20/2022, 3:45 PM

## 2022-07-22 ENCOUNTER — Ambulatory Visit: Payer: Medicare PPO

## 2022-07-24 ENCOUNTER — Other Ambulatory Visit: Payer: Self-pay | Admitting: Cardiovascular Disease

## 2022-07-24 ENCOUNTER — Other Ambulatory Visit: Payer: Self-pay | Admitting: Family Medicine

## 2022-07-24 DIAGNOSIS — I5032 Chronic diastolic (congestive) heart failure: Secondary | ICD-10-CM

## 2022-07-24 DIAGNOSIS — R600 Localized edema: Secondary | ICD-10-CM

## 2022-07-24 NOTE — Telephone Encounter (Signed)
Requested Prescriptions  Pending Prescriptions Disp Refills   potassium chloride (KLOR-CON) 10 MEQ tablet [Pharmacy Med Name: POTASSIUM CHLORIDE ER (WAX) TABS 10MEQ] 360 tablet 1    Sig: TAKE 2 TABLETS TWICE A DAY     Endocrinology:  Minerals - Potassium Supplementation Failed - 07/24/2022 12:34 PM      Failed - Cr in normal range and within 360 days    Creatinine, Ser  Date Value Ref Range Status  02/10/2022 1.29 (H) 0.61 - 1.24 mg/dL Final         Passed - K in normal range and within 360 days    Potassium  Date Value Ref Range Status  02/10/2022 3.9 3.5 - 5.1 mmol/L Final         Passed - Valid encounter within last 12 months    Recent Outpatient Visits           6 months ago AKI (acute kidney injury) [facility])   [place] Tally Joe T, FNP   7 months ago Chronic diastolic CHF (congestive heart failure) [facility])   [place] Tally Joe T, FNP   1 year ago Psoriasis of scalp   [place] Tally Joe T, [place]   1 year ago History of recent fall   [place] Tally Joe T, FNP   1 year ago Type 2 diabetes mellitus with diabetic peripheral angiopathy without gangrene, without long-term current use of insulin [facility])   [place]igal[place], Dionne Bucy, MD       Future Appointments             In 2 months Jonathon Bellows, MD [place] [facility]

## 2022-07-27 ENCOUNTER — Ambulatory Visit: Payer: Medicare PPO

## 2022-07-27 ENCOUNTER — Telehealth: Payer: Self-pay | Admitting: Cardiovascular Disease

## 2022-07-27 DIAGNOSIS — R2681 Unsteadiness on feet: Secondary | ICD-10-CM | POA: Diagnosis not present

## 2022-07-27 DIAGNOSIS — R42 Dizziness and giddiness: Secondary | ICD-10-CM | POA: Diagnosis not present

## 2022-07-27 DIAGNOSIS — M542 Cervicalgia: Secondary | ICD-10-CM

## 2022-07-27 DIAGNOSIS — R262 Difficulty in walking, not elsewhere classified: Secondary | ICD-10-CM | POA: Diagnosis not present

## 2022-07-27 NOTE — Therapy (Signed)
OUTPATIENT PHYSICAL THERAPY VESTIBULAR TREATMENT NOTE     Patient Name: Jonathon Newman MRN: [id number] DOB:[birth year], 67 y.o., male Today's Date: 07/27/2022  END OF SESSION:  PT End of Session - 07/27/22 1053     Visit Number 4    Number of Visits 25    Date for PT Re-Evaluation 10/01/22    PT Start Time [id number]    PT Stop Time 1051    PT Time Calculation (min) 36 min    Equipment Utilized During Treatment Gait belt    Activity Tolerance Patient tolerated treatment well;Other (comment)   pt feeling unwell, headache   Behavior During Therapy WFL for tasks assessed/performed              Past Medical History:  Diagnosis Date   Anginal pain ([place])    Arthritis    BPH (benign prostatic hyperplasia)    CHF (congestive heart failure) ([place])    Coronary artery disease 2010   a.) LHC 2010: high grade RCA stenosis -> 2.5 x 35m Cypher DES to RCA. b.) NSTEMI 2016 -> PCI revealed pat RCA stent; sig dLM Dz with FFR ratio 0.76 and oLCx stenosis; ref to CVTS. c.) 2v CABG 04/21/2015; LIMA-LAD, SVG-OM2. [facility].) [facility] EF 55-65%; LVEDP 14-15 mmHg; 10% p-m RCA, 30% pRCA, 20% dRCA, 95% o-pLCx, 80% m-dLM; LIMA graft pat. SVG graft with mild diffuse Dz.   DDD (degenerative disc disease), lumbar    Dyspnea    GERD (gastroesophageal reflux disease)    Hypercholesteremia    Hypertension    Left foot drop    NSTEMI (non-ST elevated [place] infarction) [place]   a.) PCI revealed patent RCA stent; significant dLM disease with FFR ratio 0.76 and oLCx stenosis; refer to CVTS for CABG.   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 LIMA to LAD amd SVG to OM2   CORONARY STENT PLACEMENT  2010   Cordis Cypher Sirolimus-eluting stent 2.50 mm x 26 [place]   Procedure: ESOPHAGEAL MANOMETRY (EM);  Surgeon: VLin Landsman MD;  Location: [facility] ENDOSCOPY;  Service: Endoscopy;  Laterality: N/A;   ESOPHAGOGASTRODUODENOSCOPY (EGD) WITH PROPOFOL N/A 06/17/2022   Procedure: ESOPHAGOGASTRODUODENOSCOPY (EGD) WITH PROPOFOL;  Surgeon: AJonathon Bellows MD;  [facility];  Service: Gastroenterology;  Laterality: N/A;   FRACTURE SURGERY     KNEE SURGERY     KNEE SURGERY     right knee    LAMINECTOMY     "plate in neck [id number]   LEFT HEART CATH AND CORS/GRAFTS ANGIOGRAPHY N/A 05/02/2018   Procedure: CORS/GRAFTS ANGIOGRAPHY;  Surgeon: AWellington Hampshire MD;  [facility];  Service: Cardiovascular;  Laterality: N/A;   RIGHT/LEFT HEART CATH AND CORONARY ANGIOGRAPHY Bilateral 05/02/2018   Procedure: LEFT HEART CATH;  Surgeon: AWellington Hampshire MD;  [facility];  Service: Cardiovascular;  Laterality: Bilateral;  THORACIC LAMINECTOMY FOR SPINAL CORD STIMULATOR N/A 05/26/2021   Procedure: THORACIC SPINAL CORD STIMULATOR AND PULSE GENERATOR PLACEMENT ([employer]);  Surgeon: Deetta Perla, MD;  Location: [facility] ORS;  Service: Neurosurgery;  Laterality: N/A;   VASECTOMY     Patient Active Problem List   Diagnosis Date Noted   CAD (coronary artery disease) 06/01/2022   Vitamin D deficiency 06/01/2022   Snoring 06/01/2022   Other specified counseling 06/01/2022   Lateral epicondylitis (tennis elbow) 06/01/2022   History of heartburn 06/01/2022   Contact dermatitis 06/01/2022   Chronic pharyngitis 06/01/2022   Abdominal tenderness, right upper quadrant 06/01/2022   Pain in joint, forearm 06/01/2022   Rash and other nonspecific skin eruption 06/01/2022   Pain in joint of left knee  04/20/2022   Swelling of limb 01/23/2022   3+ pitting edema 01/09/2022   AKI (acute kidney injury) ([place]) 01/09/2022   Left hip pain 12/19/2021   Chronic diastolic CHF (congestive heart failure) ([place]) 12/19/2021   Localized edema 12/19/2021   Morton's neuroma of both feet 07/01/2021   DDD (degenerative disc disease), lumbosacral 07/01/2021   Psoriasis of scalp 07/01/2021   Essential hypertension 03/31/2021   Type 2 diabetes mellitus with diabetic peripheral angiopathy without gangrene, without long-term current use of insulin ([place]) 03/31/2021   History of recent fall 03/31/2021   Foot drop, left 03/31/2021   BMI 33.0-33.9,adult 03/31/2021   Hyponatremia 02/14/2021   Hyperlipidemia associated with type 2 diabetes mellitus ([place]) 12/27/2020   Elevated transaminase level 10/04/2020   Morbid obesity ([place]) 05/20/2020   T2DM (type 2 diabetes mellitus) ([place]) 07/17/2019   Strain of foot 05/09/2019   Benign paroxysmal positional vertigo due to bilateral vestibular disorder 03/15/2019   Abnormal findings on diagnostic imaging of lung 03/15/2019   Chest pain 03/10/2019   Pain in right knee 09/10/2018   Unstable angina ([facility]) 04/25/2018   SOB (shortness of breath) 04/13/2018   Vertigo 04/13/2018   S/P coronary artery bypass graft x 2    Incomplete bladder emptying 07/21/2017   Low back pain 07/09/2017   Lumbar radiculopathy 07/09/2017   Gastroesophageal reflux disease with esophagitis 07/05/2017   Congestive heart failure ([place]) 07/05/2017   Benign prostatic hyperplasia with weak urinary stream 07/05/2017   Dysphagia 07/05/2017   DDD (degenerative disc disease), lumbar 07/05/2017   Hypertension associated with type 2 diabetes mellitus ([place]) 07/31/2015   OSA (obstructive sleep apnea) 07/31/2015    PCP: Gwyneth Sprout, FNP  REFERRING PROVIDER: Anabel Bene, MD   REFERRING DIAG: R42 (ICD-10-CM) - Dizziness   THERAPY DIAG:  Dizziness and giddiness  Difficulty in walking, not  elsewhere classified  Unsteadiness on feet  Cervicalgia  ONSET DATE: Reports onset of approximately 1 year ago (2023)  Rationale for Evaluation and Treatment: Rehabilitation  SUBJECTIVE:   SUBJECTIVE STATEMENT: Pt reports he is doing alright. He reports over the weekend "I was real, real dizzy."  He reports 3 instances of spinning since he was last here that lasted for seconds. Pt reports on Friday he felt weak. He reports recent blood sugar was 148. He reports a couple "light headaches."  Pt says he tripped on way over but didn't fall, caught himself, thinks is is due to L foot drop. Pt accompanied by: self  PERTINENT HISTORY:   Pt is a 67 y/o male presenting to PT for chronic dizziness. He is using a SPC to ambulate. He reports onset over a year ago. Pt states "there's days it's just crazy, and days I can  do it." Dizziness can start 5-10 minutes after lying down. Dizziness occurs with sit>stand. To be careful, he will sit for 2-3 minutes prior to standing. Pt can experience spinning while ambulating. He reports nature of dizziness is typically a spinning sensation that lasts for 15-20 seconds. Frequency of dizziness is random. Most recent episode was this past Tuesday when lying down. Reports he can go a week before having another episode. Rolling over does not cause spinning, tilting his head back causes him to feel unsteady. He reports he has tried canalith repositioning maneuvers in past without success. Pt did have migraines when younger (30 years ago), seldom gets headaches now. Pt does take aspirin for his heart and Tyelenol (hx of CAB x2, 2016). Pt also had stimulator placed (thoracic, 2022) due to back problems that caused difficulty walking and maintaining balance. Pt reports hx LE weakness but no hx of stroke. Pt has fallen multiple times and 2x in past six months. He denies hearing loss, does have B tinnitus (low frequency), and reports no ear pain. He reports no unusual changes in  vision. Pt can feel lightheaded multiple times/month, says it is random. 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 He has difficulty navigating stairs. 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 PMH significant for the following: arthritis, CHF, coronary artery disease, DDD, hypercholesteremia, HTN, L foot drop, NSTEMI, OSA on CPAP, postlaminectomy syndrome S/P CAB x2 2016, DMII, low back pain, lumbar radiculopathy, vertigo, BPPV, L hip pain, AKI, Vitamin D deficiency, pain in joint of L knee, lateral epicondylitis, please refer to chart for full details.   PAIN: From eval Are you having pain? Yes: NPRS scale: not rated/10 Pain location: back pain Pain description:   Aggravating factors:   Relieving factors: has stimulator   PRECAUTIONS: Fall  WEIGHT BEARING RESTRICTIONS: No  FALLS: Has patient fallen in last 6 months? Yes. Number of falls 2, no head impact or injuries with falls  LIVING ENVIRONMENT: Lives with:  Lives in:  Stairs:  Has following equipment at home:   PLOF: Independent  PATIENT GOALS: reduce dizziness  OBJECTIVE: taken at eval unless specified otherwise  DIAGNOSTIC FINDINGS:   PER referral [place]: "Reviewed head CT and carotid ultrasound, which were within normal limits."  CT HEAD WO CONTRAST 03/04/2022 " IMPRESSION: No acute intracranial abnormality."  MR Cervical spine WO contrast 01/06/2021" IMPRESSION: At C6-C7, there has been prior ACDF. There is fusion across the disc space. No significant spinal canal or foraminal stenosis.   Cervical spondylosis, as outlined. No more than mild spinal canal stenosis. Multilevel neural foraminal narrowing, greatest on the left at C3-C4 (moderate), on the left at C4-C5 (moderate) and bilaterally at C5-C6 (moderate right, moderate/severe left).   Also of note, there is a small right-sided facet joint effusion at C4-C5."  MR Brain WO contrast 02/14/2021 "IMPRESSION: 1. No acute  intracranial abnormality. 2. Findings of mild chronic small vessel ischemia and generalized volume loss."   COGNITION: Overall cognitive status: Within functional limits for tasks assessed   SENSATION: Impaired, pt reports neuropathy that affects feet bilat   POSTURE:  rounded shoulders  Cervical ROM:   07/15/2022:  Cervical rotation  R: 75 deg, feels pain on L L: 70 deg, feels pain on L  STRENGTH:  07/15/2022 LE MMT: 4+/5    TRANSFERS: Assistive device utilized: Single point cane  Sit to stand: Modified independence Stand to sit: Modified independence   GAIT: Gait pattern:  observed decreased gait speed (  further assessment to be completed future session), pt ambulates with SPC Distance walked: clinic distances Assistive device utilized: Single point cane   FUNCTIONAL TESTS:  07/15/2022 DGI - deferred 10MWT - 0.81 m/s no AD  PATIENT SURVEYS:  FOTO 62 ABC Scale: 84.4%  VESTIBULAR ASSESSMENT:  GENERAL OBSERVATION: Relies on SPC for balance, cautious ambulator (gait speed to be formally assessed future visit)   SYMPTOM BEHAVIOR:  Subjective history: Pt describes primarily a spinning dizziness and at times a lightheadedness. Symptom onset >1 year ago. Positionally triggered and also random.  Non-Vestibular symptoms: headaches and tinnitus  Type of dizziness: Spinning/Vertigo and Lightheadedness/Faint  Frequency: Pt reports is mostly random  Duration: seconds  Aggravating factors: Spontaneous and Induced by position change: also reports occurs when ambulating   OCULOMOTOR EXAM:  Ocular Alignment: normal  Ocular ROM: No Limitations  Spontaneous Nystagmus: absent  Gaze-Induced Nystagmus: absent  Smooth Pursuits: majority of movement appears smooth, however, few possible saccadic intrusions noted  Saccades: intact  Convergence/Divergence: at 6" sees double   VESTIBULAR - OCULAR REFLEX:   Slow VOR:   VOR Cancellation:   Head-Impulse Test: deferred  Dynamic  Visual Acuity: Abnormal, line change 10 to 7, however, no dizziness with testing   POSITIONAL TESTING:    Dix-Hallpike  -R side: instant onset of jerk nystagmus, downbeat and R torsional lasting <10 sec -L side: very weak, subtle jerk nystagmus also downbeat appears torsional to R side  Roll testing: negative bilaterally Pt reports headache rated as 4/10 following treatment. He reports his headaches can last all day or an hour.   OTHOSTATICS: deferred  FUNCTIONAL GAIT: Dynamic Gait Index: to be complete next 1-2 visits    VESTIBULAR TREATMENT:                                                                                                   DATE:   NMR: BP prior to treatment: 136/93 mmHg, HR 68 bpm, SPO2 97%  Repeat of positional testing: DH R: negative DH L: jerk nystgamus that appears torsional to L, upbeat, lasts <30 sec  Epley 2x to treat L side. No nystagmus or dizziness with second maneuver.   Pt reports 4/10 headache following manuever  PT instructed pt in post-maneuver precautions   BP after a few minutes rest following maneuvers, seated, LUE: 137/92 mmHg HR 64 bpm   TherEx: Seated DF 2x20 bilat  Seated heel raises 2x20 LLE only due to pain felt RLE  Pt reports decreased headache following seated interventions. Pt noted to tilt his head back while seated and reported brief instance of spinning sensation. No nystagmus observed.    Reviewed and updated HEP to address ankle strength deficits. Instructed to perform only on RLE if pain-free.  Added to address ankle weakness Access Code: [email or web address] URL: [email or web address] Date: 07/27/2022 Prepared by: Ricard Dillon  Exercises - Seated Toe Raise  - 1 x daily - 6 x weekly - 2 sets - 20 reps - Seated Heel Raise  - 1 x daily - 6 x weekly - 2 sets - 20 reps    PATIENT EDUCATION:  Education details: positional testing, post maneuver precautions, updated HEP Person educated: Patient Education  method: Explanation, Demonstration, Tactile cues, Verbal cues, and Handouts Education comprehension: verbalized understanding and returned demonstration  HOME EXERCISE PROGRAM:  07/27/22: Access Code: [email or web address] URL: [email or web address] Date: 07/27/2022 Prepared by: Ricard Dillon  Exercises - Seated Toe Raise  - 1 x daily - 6 x weekly - 2 sets - 20 reps - Seated Heel Raise  - 1 x daily - 6 x weekly - 2 sets - 20 reps   Access Code: [email or web address] URL: [email or web address] Date: 07/09/2022 Prepared by: Ricard Dillon  Exercises - Seated Gaze Stabilization with Head Rotation  - 1 x daily - 7 x weekly - 3 sets - 2 reps - 30 sec hold  GOALS: Goals reviewed with patient? Initiated goal review with patient, further review to be completed with testing next visit    SHORT TERM GOALS: Target date: 09/07/2022   Patient will be independent in home exercise program to improve strength/mobility for better functional independence with ADLs. Baseline: Goal status: INITIAL   LONG TERM GOALS: Target date: 10/19/2022    Patient will increase FOTO score to equal to or greater than 68 to demonstrate s improvement in mobility and quality of life.  Baseline: 62 Goal status: INITIAL  2.  Patient will increase ABC scale score >80% to demonstrate better functional mobility and better confidence with ADLs.  Baseline: 84.4% Goal status: INITIAL  3.  Patient will increase dynamic gait index score to >19/24 as to demonstrate reduced fall risk and improved dynamic gait balance for better safety with community/home ambulation.   Baseline: to be completed next 1-2 visits Goal status: INITIAL  4. The pt will ambulate at least 1.0 m/s with LRD on 10MWT to indicate functional community ambulation.  Baseline: to be completed next 1-2 visits. 07/15/22: 0.81 m/s  Goal status: INITIAL    ASSESSMENT:  CLINICAL IMPRESSION: Pt with positive L DH today. PT provided Epley 2x to treat, with  no nystagmus or dizziness with second maneuver. Pt did tilt head back while seated later in session and noted very brief spinning sensation. Will continue to monitor, and plan to resume VOR interventions. The pt will benefit from further skilled PT to address these deficits in order to improve balance and decrease movement-triggered dizziness and imbalance.    OBJECTIVE IMPAIRMENTS: Abnormal gait, decreased balance, decreased mobility, difficulty walking, dizziness, impaired sensation, improper body mechanics, and pain.   ACTIVITY LIMITATIONS: bending, squatting, stairs, reach over head, and locomotion level  PARTICIPATION LIMITATIONS: meal prep, cleaning, shopping, and community activity  PERSONAL FACTORS: Age, Fitness, Past/current experiences, Time since onset of injury/illness/exacerbation, and 3+ comorbidities: PMH significant for the following: arthritis, CHF, coronary artery disease, DDD, hypercholesteremia, HTN, L foot drop, NSTEMI, OSA on CPAP, postlaminectomy syndrome S/P CAB x2 2016, DMII, low back pain, lumbar radiculopathy, vertigo, unstable angina, BPPV, L hip pain, AKI, Vitamin D deficiency, pain in joint of L knee, lateral epicondylitis, please refer to chart for full details.   are also affecting patient's functional outcome.   REHAB POTENTIAL: Good  CLINICAL DECISION MAKING: Evolving/moderate complexity  EVALUATION COMPLEXITY: Moderate     PLAN:  PT FREQUENCY: 2x/week  PT DURATION: 12 weeks  PLANNED INTERVENTIONS: Therapeutic exercises, Therapeutic activity, Neuromuscular re-education, Balance training, Gait training, Patient/Family education, Self Care, Joint mobilization, Stair training, Vestibular training, Canalith repositioning, Visual/preceptual remediation/compensation, Orthotic/Fit training, DME instructions, Wheelchair mobility training, Spinal mobilization, Cryotherapy, Moist heat, Splintting, Taping, Manual therapy, and Re-evaluation  PLAN FOR NEXT  SESSION:  VOR, balance, strength   Zollie Pee, PT 07/27/2022, 5:49 PM

## 2022-07-27 NOTE — Telephone Encounter (Signed)
Patient came by office - in lobby  States that Jonathon Newman has been denied by pharmacy  Patient would like to know how to get medication Please advise

## 2022-07-27 NOTE — Telephone Encounter (Signed)
Left message to call back  

## 2022-07-28 DIAGNOSIS — R202 Paresthesia of skin: Secondary | ICD-10-CM | POA: Diagnosis not present

## 2022-07-28 DIAGNOSIS — R29818 Other symptoms and signs involving the nervous system: Secondary | ICD-10-CM | POA: Diagnosis not present

## 2022-07-28 DIAGNOSIS — R42 Dizziness and giddiness: Secondary | ICD-10-CM | POA: Diagnosis not present

## 2022-07-28 DIAGNOSIS — R200 Anesthesia of skin: Secondary | ICD-10-CM | POA: Diagnosis not present

## 2022-07-28 DIAGNOSIS — M21372 Foot drop, left foot: Secondary | ICD-10-CM | POA: Diagnosis not present

## 2022-07-28 NOTE — Telephone Encounter (Signed)
I called and spoke with Biance at [facility]. She advised that the patient's Zetia (Ezetimibe) prescription was delivered to the home address we have on file on 07/22/22.  I attempted to call the patient to follow up. No answer- I left a detailed message (ok per DPR) of the above information that I received from [facility].  I asked that the patient call back if we received the wrong message or the name of the wrong medication, but otherwise he should have the Zetia in his possession.Marland Kitchen

## 2022-07-29 ENCOUNTER — Ambulatory Visit: Payer: Medicare PPO

## 2022-07-29 DIAGNOSIS — R2681 Unsteadiness on feet: Secondary | ICD-10-CM | POA: Diagnosis not present

## 2022-07-29 DIAGNOSIS — R42 Dizziness and giddiness: Secondary | ICD-10-CM

## 2022-07-29 DIAGNOSIS — R262 Difficulty in walking, not elsewhere classified: Secondary | ICD-10-CM | POA: Diagnosis not present

## 2022-07-29 DIAGNOSIS — M542 Cervicalgia: Secondary | ICD-10-CM | POA: Diagnosis not present

## 2022-07-29 NOTE — Therapy (Signed)
OUTPATIENT PHYSICAL THERAPY VESTIBULAR TREATMENT NOTE     Patient Name: Jonathon Newman MRN: [id number] DOB:1956-05-03, 67 y.o., male Today's Date: 07/29/2022  END OF SESSION:  PT End of Session - 07/29/22 1137     Visit Number 5    Number of Visits 25    Date for PT Re-Evaluation 10/01/22    PT Start Time [id number]    PT Stop Time 1128    PT Time Calculation (min) 35 min    Equipment Utilized During Treatment Gait belt    Activity Tolerance Patient tolerated treatment well;Other (comment)   pt feeling unwell, headache   Behavior During Therapy WFL for tasks assessed/performed               Past Medical History:  Diagnosis Date   Anginal pain ([place])    Arthritis    BPH (benign prostatic hyperplasia)    CHF (congestive heart failure) ([place])    Coronary artery disease 2010   a.) LHC 2010: high grade RCA stenosis -> 2.5 x 62m Cypher DES to RCA. b.) NSTEMI 2016 -> PCI revealed pat RCA stent; sig dLM Dz with FFR ratio 0.76 and oLCx stenosis; ref to CVTS. c.) 2v CABG 04/21/2015; LIMA-LAD, SVG-OM2. d.) [facility] EF 55-65%; LVEDP 14-15 mmHg; 10% p-m RCA, 30% pRCA, 20% dRCA, 95% o-pLCx, 80% m-dLM; LIMA graft pat. SVG graft with mild diffuse Dz.   DDD (degenerative disc disease), lumbar    Dyspnea    GERD (gastroesophageal reflux disease)    Hypercholesteremia    Hypertension    Left foot drop    NSTEMI (non-ST elevated myocardial infarction) ([place] 2016   a.) PCI revealed patent RCA stent; significant dLM disease with FFR ratio 0.76 and oLCx stenosis; refer to CVTS for CABG.   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 LIMA to LAD amd SVG to OM2   CORONARY STENT PLACEMENT  2010   Cordis Cypher Sirolimus-eluting stent 2.50 mm x [place]   Procedure: ESOPHAGEAL MANOMETRY (EM);  Surgeon: VLin Landsman MD;  Location: [facility] ENDOSCOPY;  Service: Endoscopy;  Laterality: N/A;   ESOPHAGOGASTRODUODENOSCOPY (EGD) WITH PROPOFOL N/A 06/17/2022   Procedure: ESOPHAGOGASTRODUODENOSCOPY (EGD) WITH PROPOFOL;  Surgeon: AJonathon Bellows MD;  [facility];  Service: Gastroenterology;  Laterality: N/A;   FRACTURE SURGERY     KNEE SURGERY     KNEE SURGERY     right knee    LAMINECTOMY     "plate in neck [id number]   LEFT HEART CATH AND CORS/GRAFTS ANGIOGRAPHY N/A 05/02/2018   Procedure: CORS/GRAFTS ANGIOGRAPHY;  Surgeon: AWellington Hampshire MD;  [facility];  Service: Cardiovascular;  Laterality: N/A;   RIGHT/LEFT HEART CATH AND CORONARY ANGIOGRAPHY Bilateral 05/02/2018   Procedure: LEFT HEART CATH;  Surgeon: AWellington Hampshire MD;  [facility];  Service: Cardiovascular;  Laterality: Bilateral;  THORACIC LAMINECTOMY FOR SPINAL CORD STIMULATOR N/A 05/26/2021   Procedure: THORACIC SPINAL CORD STIMULATOR AND PULSE GENERATOR PLACEMENT ([employer]TRON[employer]);  Surgeon: Deetta Perla, MD;  Location: [facility] ORS;  Service: Neurosurgery;  Laterality: N/A;   VASECTOMY     Patient Active Problem List   Diagnosis Date Noted   CAD (coronary artery disease) 06/01/2022   Vitamin D deficiency 06/01/2022   Snoring 06/01/2022   Other specified counseling 06/01/2022   Lateral epicondylitis (tennis elbow) 06/01/2022   History of heartburn 06/01/2022   Contact dermatitis 06/01/2022   Chronic pharyngitis 06/01/2022   Abdominal tenderness, right upper quadrant 06/01/2022   Pain in joint, forearm 06/01/2022   Rash and other nonspecific skin eruption 06/01/2022   Pain in joint of left knee  04/20/2022   Swelling of limb 01/23/2022   3+ pitting edema 01/09/2022   AKI (acute kidney injury) ([place]) 01/09/2022   Left hip pain 12/19/2021   Chronic diastolic CHF (congestive heart failure) ([place]) 12/19/2021   Localized edema 12/19/2021   Morton's neuroma of both feet 07/01/2021   DDD (degenerative disc disease), lumbosacral 07/01/2021   Psoriasis of scalp 07/01/2021   Essential hypertension 03/31/2021   Type 2 diabetes mellitus with diabetic peripheral angiopathy without gangrene, without long-term current use of insulin ([place]) 03/31/2021   History of recent fall 03/31/2021   Foot drop, left 03/31/2021   BMI 33.0-33.9,adult 03/31/2021   Hyponatremia 02/14/2021   Hyperlipidemia associated with type 2 diabetes mellitus ([place]) 12/27/2020   Elevated transaminase level 10/04/2020   Morbid obesity ([place]) 05/20/2020   T2DM (type 2 diabetes mellitus) ([place]) 07/17/2019   Strain of foot 05/09/2019   Benign paroxysmal positional vertigo due to bilateral vestibular disorder 03/15/2019   Abnormal findings on diagnostic imaging of lung 03/15/2019   Chest pain 03/10/2019   Pain in right knee 09/10/2018   Unstable angina ([facility]) 04/25/2018   SOB (shortness of breath) 04/13/2018   Vertigo 04/13/2018   S/P coronary artery bypass graft x 2    Incomplete bladder emptying 07/21/2017   Low back pain 07/09/2017   Lumbar radiculopathy 07/09/2017   Gastroesophageal reflux disease with esophagitis 07/05/2017   Congestive heart failure ([place]) 07/05/2017   Benign prostatic hyperplasia with weak urinary stream 07/05/2017   Dysphagia 07/05/2017   DDD (degenerative disc disease), lumbar 07/05/2017   Hypertension associated with type 2 diabetes mellitus ([place]) 07/31/2015   OSA (obstructive sleep apnea) 07/31/2015    PCP: Gwyneth Sprout, FNP  REFERRING PROVIDER: Anabel Bene, MD   REFERRING DIAG: R42 (ICD-10-CM) - Dizziness   THERAPY DIAG:  Dizziness and giddiness  Unsteadiness on feet  ONSET  DATE: Reports onset of approximately 1 year ago (2023)  Rationale for Evaluation and Treatment: Rehabilitation  SUBJECTIVE:   SUBJECTIVE STATEMENT: Pt reports he went to the gym to this morning. Currently has a headache he rates as a 7-8/10.  Pt reports he is still getting spinning dizziness and says it is random. Pt reports no falls but stumbling due to foot drop. He recently was prescribed new medication for his symptoms but has not taken it yet today, plans to when he gets home as he wants to see how it affects him first. Pt accompanied by: self  PERTINENT HISTORY:   Pt is a 67 y/o male presenting to PT for chronic dizziness. He is using a SPC to ambulate. He reports onset over a year ago. Pt states "there's days it's just crazy, and days I can do it." Dizziness can start 5-10 minutes  after lying down. Dizziness occurs with sit>stand. To be careful, he will sit for 2-3 minutes prior to standing. Pt can experience spinning while ambulating. He reports nature of dizziness is typically a spinning sensation that lasts for 15-20 seconds. Frequency of dizziness is random. Most recent episode was this past Tuesday when lying down. Reports he can go a week before having another episode. Rolling over does not cause spinning, tilting his head back causes him to feel unsteady. He reports he has tried canalith repositioning maneuvers in past without success. Pt did have migraines when younger (30 years ago), seldom gets headaches now. Pt does take aspirin for his heart and Tyelenol (hx of CAB x2, 2016). Pt also had stimulator placed (thoracic, 2022) due to back problems that caused difficulty walking and maintaining balance. Pt reports hx LE weakness but no hx of stroke. Pt has fallen multiple times and 2x in past six months. He denies hearing loss, does have B tinnitus (low frequency), and reports no ear pain. He reports no unusual changes in vision. Pt can feel lightheaded multiple times/month, says it is  random. 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 He has difficulty navigating stairs. 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 PMH significant for the following: arthritis, CHF, coronary artery disease, DDD, hypercholesteremia, HTN, L foot drop, NSTEMI, OSA on CPAP, postlaminectomy syndrome S/P CAB x2 2016, DMII, low back pain, lumbar radiculopathy, vertigo, BPPV, L hip pain, AKI, Vitamin D deficiency, pain in joint of L knee, lateral epicondylitis, please refer to chart for full details.   PAIN: From eval Are you having pain? Yes: NPRS scale: not rated/10 Pain location: back pain Pain description:   Aggravating factors:   Relieving factors: has stimulator   PRECAUTIONS: Fall  WEIGHT BEARING RESTRICTIONS: No  FALLS: Has patient fallen in last 6 months? Yes. Number of falls 2, no head impact or injuries with falls  LIVING ENVIRONMENT: Lives with:  Lives in:  Stairs:  Has following equipment at home:   PLOF: Independent  PATIENT GOALS: reduce dizziness  OBJECTIVE: taken at eval unless specified otherwise  DIAGNOSTIC FINDINGS:   PER referral [place]: "Reviewed head CT and carotid ultrasound, which were within normal limits."  CT HEAD WO CONTRAST 03/04/2022 " IMPRESSION: No acute intracranial abnormality."  MR Cervical spine WO contrast 01/06/2021" IMPRESSION: At C6-C7, there has been prior ACDF. There is fusion across the disc space. No significant spinal canal or foraminal stenosis.   Cervical spondylosis, as outlined. No more than mild spinal canal stenosis. Multilevel neural foraminal narrowing, greatest on the left at C3-C4 (moderate), on the left at C4-C5 (moderate) and bilaterally at C5-C6 (moderate right, moderate/severe left).   Also of note, there is a small right-sided facet joint effusion at C4-C5."  MR Brain WO contrast 02/14/2021 "IMPRESSION: 1. No acute intracranial abnormality. 2. Findings of mild chronic small vessel  ischemia and generalized volume loss."   COGNITION: Overall cognitive status: Within functional limits for tasks assessed   SENSATION: Impaired, pt reports neuropathy that affects feet bilat   POSTURE:  rounded shoulders  Cervical ROM:   07/15/2022:  Cervical rotation  R: 75 deg, feels pain on L L: 70 deg, feels pain on L  STRENGTH:  07/15/2022 LE MMT: 4+/5    TRANSFERS: Assistive device utilized: Single point cane  Sit to stand: Modified independence Stand to sit: Modified independence   GAIT: Gait pattern:  observed decreased gait speed (further assessment to be completed future session),  pt ambulates with SPC Distance walked: clinic distances Assistive device utilized: Single point cane   FUNCTIONAL TESTS:  07/15/2022 DGI - deferred 10MWT - 0.81 m/s no AD  PATIENT SURVEYS:  FOTO 62 ABC Scale: 84.4%  VESTIBULAR ASSESSMENT:  GENERAL OBSERVATION: Relies on SPC for balance, cautious ambulator (gait speed to be formally assessed future visit)   SYMPTOM BEHAVIOR:  Subjective history: Pt describes primarily a spinning dizziness and at times a lightheadedness. Symptom onset >1 year ago. Positionally triggered and also random.  Non-Vestibular symptoms: headaches and tinnitus  Type of dizziness: Spinning/Vertigo and Lightheadedness/Faint  Frequency: Pt reports is mostly random  Duration: seconds  Aggravating factors: Spontaneous and Induced by position change: also reports occurs when ambulating   OCULOMOTOR EXAM:  Ocular Alignment: normal  Ocular ROM: No Limitations  Spontaneous Nystagmus: absent  Gaze-Induced Nystagmus: absent  Smooth Pursuits: majority of movement appears smooth, however, few possible saccadic intrusions noted  Saccades: intact  Convergence/Divergence: at 6" sees double   VESTIBULAR - OCULAR REFLEX:   Slow VOR:   VOR Cancellation:   Head-Impulse Test: deferred  Dynamic Visual Acuity: Abnormal, line change 10 to 7, however, no dizziness  with testing   POSITIONAL TESTING:    Dix-Hallpike  -R side: instant onset of jerk nystagmus, downbeat and R torsional lasting <10 sec -L side: very weak, subtle jerk nystagmus also downbeat appears torsional to R side  Roll testing: negative bilaterally Pt reports headache rated as 4/10 following treatment. He reports his headaches can last all day or an hour.   OTHOSTATICS: deferred  FUNCTIONAL GAIT: Dynamic Gait Index: to be complete next 1-2 visits    VESTIBULAR TREATMENT:                                                                                                   DATE:   NMR BP, seated, LUE (prior to interventions) 127/72 mmHg, HR 80   VORx1, standing, firm surface 2x30 sec horizontal head turns, 1x30 sec vertical head turns. Pt does report this increases his headache. Pt feels headache around his eyes mostly. Pt also exhibits increased sway with intervention. Intervention discontinued to allow for rest break.  Heat donned to bilteral shoulders while pt takes rest break. Pt reports heat feels good. No adverse reaction to treatment. Pt reports nothing on skin that would prohibit use of heat. Skin appears WNL  On airex: WBOS EO 30 sec WBOS EC 30 sec - increased sway, reports increased headache NBOS EO 30 sec   Seated EC with horizontal head turns 10x 2 sets. Briefly recreates spinning sensation.  Seated EC with vertical head turns 10x 2 sets  Seated EO with horizontal head turns 10x 2 sets  Airex beam - LTL gait and tandem gait x multiple reps of each. Intermittent UE support  SLB 30 sec each LE      PATIENT EDUCATION: Education details: Pt educated throughout session about proper posture and technique with exercises. Improved exercise technique, movement at target joints, use of target muscles after min to mod verbal, visual, tactile cues.  Person educated: Patient Education method:  Explanation, Demonstration, Tactile cues, Verbal cues, and  Handouts Education comprehension: verbalized understanding and returned demonstration  HOME EXERCISE PROGRAM: pt to continue HEP as previously given 07/27/22: Access Code: [email or web address] URL: [email or web address] Date: 07/27/2022 Prepared by: Ricard Dillon  Exercises - Seated Toe Raise  - 1 x daily - 6 x weekly - 2 sets - 20 reps - Seated Heel Raise  - 1 x daily - 6 x weekly - 2 sets - 20 reps   Access Code: [email or web address] URL: [email or web address] Date: 07/09/2022 Prepared by: Ricard Dillon  Exercises - Seated Gaze Stabilization with Head Rotation  - 1 x daily - 7 x weekly - 3 sets - 2 reps - 30 sec hold  GOALS: Goals reviewed with patient? Initiated goal review with patient, further review to be completed with testing next visit    SHORT TERM GOALS: Target date: 09/09/2022   Patient will be independent in home exercise program to improve strength/mobility for better functional independence with ADLs. Baseline: Goal status: INITIAL   LONG TERM GOALS: Target date: 10/21/2022    Patient will increase FOTO score to equal to or greater than 68 to demonstrate s improvement in mobility and quality of life.  Baseline: 62 Goal status: INITIAL  2.  Patient will increase ABC scale score >80% to demonstrate better functional mobility and better confidence with ADLs.  Baseline: 84.4% Goal status: INITIAL  3.  Patient will increase dynamic gait index score to >19/24 as to demonstrate reduced fall risk and improved dynamic gait balance for better safety with community/home ambulation.   Baseline: to be completed next 1-2 visits Goal status: INITIAL  4. The pt will ambulate at least 1.0 m/s with LRD on 10MWT to indicate functional community ambulation.  Baseline: to be completed next 1-2 visits. 07/15/22: 0.81 m/s  Goal status: INITIAL    ASSESSMENT:  CLINICAL IMPRESSION: Pt presents to PT session with headache. Vitals taken and were WNL. Reports he has continued  to have spinning dizziness, can be random. He will be trying new medication to address his symptoms. Overall appointment limited due to headache, session ended early as a result. Pt symptoms did increase with EC interventions, and this also increased sway feeling as well. The pt will benefit from further skilled PT to address these deficits in order to improve balance and decrease movement-triggered dizziness and imbalance.    OBJECTIVE IMPAIRMENTS: Abnormal gait, decreased balance, decreased mobility, difficulty walking, dizziness, impaired sensation, improper body mechanics, and pain.   ACTIVITY LIMITATIONS: bending, squatting, stairs, reach over head, and locomotion level  PARTICIPATION LIMITATIONS: meal prep, cleaning, shopping, and community activity  PERSONAL FACTORS: Age, Fitness, Past/current experiences, Time since onset of injury/illness/exacerbation, and 3+ comorbidities: PMH significant for the following: arthritis, CHF, coronary artery disease, DDD, hypercholesteremia, HTN, L foot drop, NSTEMI, OSA on CPAP, postlaminectomy syndrome S/P CAB x2 2016, DMII, low back pain, lumbar radiculopathy, vertigo, unstable angina, BPPV, L hip pain, AKI, Vitamin D deficiency, pain in joint of L knee, lateral epicondylitis, please refer to chart for full details.   are also affecting patient's functional outcome.   REHAB POTENTIAL: Good  CLINICAL DECISION MAKING: Evolving/moderate complexity  EVALUATION COMPLEXITY: Moderate     PLAN:  PT FREQUENCY: 2x/week  PT DURATION: 12 weeks  PLANNED INTERVENTIONS: Therapeutic exercises, Therapeutic activity, Neuromuscular re-education, Balance training, Gait training, Patient/Family education, Self Care, Joint mobilization, Stair training, Vestibular training, Canalith repositioning, Visual/preceptual remediation/compensation, Orthotic/Fit training, DME instructions, Wheelchair mobility training, Spinal mobilization, Cryotherapy, Moist heat, Splintting,  Taping, Manual  therapy, and Re-evaluation  PLAN FOR NEXT SESSION: VOR, balance, strength   Zollie Pee, PT 07/29/2022, 11:42 AM

## 2022-08-04 ENCOUNTER — Other Ambulatory Visit: Payer: Self-pay | Admitting: Family Medicine

## 2022-08-04 ENCOUNTER — Encounter: Payer: Self-pay | Admitting: Family Medicine

## 2022-08-04 ENCOUNTER — Ambulatory Visit: Payer: Medicare PPO

## 2022-08-04 DIAGNOSIS — L409 Psoriasis, unspecified: Secondary | ICD-10-CM

## 2022-08-05 MED ORDER — SEMAGLUTIDE (1 MG/DOSE) 4 MG/3ML ~~LOC~~ SOPN: 1.0000 mg | PEN_INJECTOR | SUBCUTANEOUS | 0 refills | Status: DC

## 2022-08-06 ENCOUNTER — Ambulatory Visit: Payer: Medicare PPO

## 2022-08-06 ENCOUNTER — Other Ambulatory Visit: Payer: Self-pay | Admitting: Family Medicine

## 2022-08-06 DIAGNOSIS — R2681 Unsteadiness on feet: Secondary | ICD-10-CM | POA: Diagnosis not present

## 2022-08-06 DIAGNOSIS — R262 Difficulty in walking, not elsewhere classified: Secondary | ICD-10-CM

## 2022-08-06 DIAGNOSIS — R42 Dizziness and giddiness: Secondary | ICD-10-CM | POA: Diagnosis not present

## 2022-08-06 DIAGNOSIS — M542 Cervicalgia: Secondary | ICD-10-CM | POA: Diagnosis not present

## 2022-08-06 NOTE — Therapy (Signed)
OUTPATIENT PHYSICAL THERAPY VESTIBULAR TREATMENT NOTE     Patient Name: Jonathon Newman MRN: [id number] DOB:Aug 11, 1955, 67 y.o., male Today's Date: 08/06/2022  END OF SESSION:  PT End of Session - 08/06/22 0940     Visit Number 6    Number of Visits 25    Date for PT Re-Evaluation 10/01/22    Authorization Type [employer] Medicare; Tricare for life secondary    Authorization Time Period 07/09/22-10/02/22    Progress Note Due on Visit 10    PT Start Time 0935    PT Stop Time 1013    PT Time Calculation (min) 38 min    Equipment Utilized During Treatment Gait belt    Activity Tolerance Patient tolerated treatment well;Other (comment)    Behavior During Therapy WFL for tasks assessed/performed               Past Medical History:  Diagnosis Date   Anginal pain ([place])    Arthritis    BPH (benign prostatic hyperplasia)    CHF (congestive heart failure) ([place])    Coronary artery disease 2010   a.) LHC 2010: high grade RCA stenosis -> 2.5 x 27m Cypher DES to RCA. b.) NSTEMI 2016 -> PCI revealed pat RCA stent; sig dLM Dz with FFR ratio 0.76 and oLCx stenosis; ref to CVTS. c.) 2v CABG 04/21/2015; LIMA-LAD, SVG-OM2. [facility].) [facility] EF 55-65%; LVEDP 14-15 mmHg; 10% p-m RCA, 30% pRCA, 20% dRCA, 95% o-pLCx, 80% m-dLM; LIMA graft pat. SVG graft with mild diffuse Dz.   DDD (degenerative disc disease), lumbar    Dyspnea    GERD (gastroesophageal reflux disease)    Hypercholesteremia    Hypertension    Left foot drop    NSTEMI (non-ST elevated myocardial infarction) ([place] 2016   a.) PCI revealed patent RCA stent; significant dLM disease with FFR ratio 0.76 and oLCx stenosis; refer to CVTS for CABG.   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 LIMA to LAD amd SVG to OM2   CORONARY STENT PLACEMENT  2010   Cordis Cypher Sirolimus-eluting stent 2.50 mm x 26 mm placed to the RCA at [place] 08/04/2017   Procedure: ESOPHAGEAL MANOMETRY (EM);  Surgeon: VLin Landsman MD;  Location: [facility] ENDOSCOPY;  Service: Endoscopy;  Laterality: N/A;   ESOPHAGOGASTRODUODENOSCOPY (EGD) WITH PROPOFOL N/A 06/17/2022   Procedure: ESOPHAGOGASTRODUODENOSCOPY (EGD) WITH PROPOFOL;  Surgeon: AJonathon Bellows MD;  [facility];  Service: Gastroenterology;  Laterality: N/A;   FRACTURE SURGERY     KNEE SURGERY     KNEE SURGERY     right knee    LAMINECTOMY     "plate in neck [id number]   LEFT HEART CATH AND CORS/GRAFTS ANGIOGRAPHY N/A 05/02/2018   Procedure: CORS/GRAFTS ANGIOGRAPHY;  Surgeon: AWellington Hampshire MD;  [facility];  Service: Cardiovascular;  Laterality: N/A;   RIGHT/LEFT HEART CATH AND CORONARY ANGIOGRAPHY Bilateral 05/02/2018  Procedure: LEFT HEART CATH;  Surgeon: Wellington Hampshire, MD;  Location: [place] CV LAB;  Service: Cardiovascular;  Laterality: Bilateral;   THORACIC LAMINECTOMY FOR SPINAL CORD STIMULATOR N/A 05/26/2021   Procedure: THORACIC SPINAL CORD STIMULATOR AND PULSE GENERATOR PLACEMENT (MEDTRONIC);  Surgeon: Deetta Perla, MD;  Location: [facility] ORS;  Service: Neurosurgery;  Laterality: N/A;   VASECTOMY     Patient Active Problem List   Diagnosis Date Noted   CAD (coronary artery disease) 06/01/2022   Vitamin D deficiency 06/01/2022   Snoring 06/01/2022   Other specified counseling 06/01/2022   Lateral epicondylitis (tennis elbow) 06/01/2022   History of heartburn 06/01/2022   Contact dermatitis 06/01/2022   Chronic pharyngitis 06/01/2022   Abdominal tenderness, right upper quadrant 06/01/2022   Pain in  joint, forearm 06/01/2022   Rash and other nonspecific skin eruption 06/01/2022   Pain in joint of left knee 04/20/2022   Swelling of limb 01/23/2022   3+ pitting edema 01/09/2022   AKI (acute kidney injury) ([place]) 01/09/2022   Left hip pain 12/19/2021   Chronic diastolic CHF (congestive heart failure) ([place]) 12/19/2021   Localized edema 12/19/2021   Morton's neuroma of both feet 07/01/2021   DDD (degenerative disc disease), lumbosacral 07/01/2021   Psoriasis of scalp 07/01/2021   Essential hypertension 03/31/2021   Type 2 diabetes mellitus with diabetic peripheral angiopathy without gangrene, without long-term current use of insulin ([place]) 03/31/2021   History of recent fall 03/31/2021   Foot drop, left 03/31/2021   BMI 33.0-33.9,adult 03/31/2021   Hyponatremia 02/14/2021   Hyperlipidemia associated with type 2 diabetes mellitus ([place]) 12/27/2020   Elevated transaminase level 10/04/2020   Morbid obesity ([place]) 05/20/2020   T2DM (type 2 diabetes mellitus) ([place]) 07/17/2019   Strain of foot 05/09/2019   Benign paroxysmal positional vertigo due to bilateral vestibular disorder 03/15/2019   Abnormal findings on diagnostic imaging of lung 03/15/2019   Chest pain 03/10/2019   Pain in right knee 09/10/2018   Unstable angina ([facility]) 04/25/2018   SOB (shortness of breath) 04/13/2018   Vertigo 04/13/2018   S/P coronary artery bypass graft x 2    Incomplete bladder emptying 07/21/2017   Low back pain 07/09/2017   Lumbar radiculopathy 07/09/2017   Gastroesophageal reflux disease with esophagitis 07/05/2017   Congestive heart failure ([place]) 07/05/2017   Benign prostatic hyperplasia with weak urinary stream 07/05/2017   Dysphagia 07/05/2017   DDD (degenerative disc disease), lumbar 07/05/2017   Hypertension associated with type 2 diabetes mellitus ([place]) 07/31/2015   OSA (obstructive sleep apnea) 07/31/2015    PCP: Gwyneth Sprout, FNP  REFERRING PROVIDER: Anabel Bene, MD   REFERRING  DIAG: R42 (ICD-10-CM) - Dizziness   THERAPY DIAG:  Dizziness and giddiness  Unsteadiness on feet  Difficulty in walking, not elsewhere classified  Cervicalgia  ONSET DATE: Reports onset of approximately 1 year ago (2023)  Rationale for Evaluation and Treatment: Rehabilitation  SUBJECTIVE:   SUBJECTIVE STATEMENT: Started new dizziness med with Dr. Melrose Nakayama, says it is helping tremendously. No other updates. HEP conitnues to go well.   PERTINENT HISTORY:  Pt is a 67 y/o male presenting to PT for chronic dizziness. He is using a SPC to ambulate. He reports onset over a year ago. Pt states "there's days it's just crazy, and days I can do it." Dizziness can start 5-10 minutes after lying down. Dizziness occurs with sit>stand. To be careful, he will sit for 2-3 minutes prior to standing. Pt can experience spinning while ambulating. He  reports nature of dizziness is typically a spinning sensation that lasts for 15-20 seconds. Frequency of dizziness is random. Most recent episode was this past Tuesday when lying down. Reports he can go a week before having another episode. Rolling over does not cause spinning, tilting his head back causes him to feel unsteady. He reports he has tried canalith repositioning maneuvers in past without success. Pt did have migraines when younger (30 years ago), seldom gets headaches now. Pt does take aspirin for his heart and Tyelenol (hx of CAB x2, 2016). Pt also had stimulator placed (thoracic, 2022) due to back problems that caused difficulty walking and maintaining balance. Pt reports hx LE weakness but no hx of stroke. Pt has fallen multiple times and 2x in past six months. He denies hearing loss, does have B tinnitus (low frequency), and reports no ear pain. He reports no unusual changes in vision. Pt can feel lightheaded multiple times/month, says it is random. 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 He has difficulty navigating stairs. 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 PMH significant for the following: arthritis, CHF, coronary artery disease, DDD, hypercholesteremia, HTN, L foot drop, NSTEMI, OSA on CPAP, postlaminectomy syndrome S/P CAB x2 2016, DMII, low back pain, lumbar radiculopathy, vertigo, BPPV, L hip pain, AKI, Vitamin D deficiency, pain in joint of L knee, lateral epicondylitis, please refer to chart for full details.   PAIN: Yes, typical daily pain, does not rate; also reports HA at 1/10 at present.   PRECAUTIONS: Fall  WEIGHT BEARING RESTRICTIONS: No  FALLS: Has patient fallen in last 6 months? Yes. Number of falls 2, no head impact or injuries with falls  PATIENT GOALS: reduce dizziness  OBJECTIVE:   INTERVENTION THIS DATE 08/06/22:  -normal stance firm surface horizontal VOR 2x30sec, vertical VOR 2x30sec  -normal stance airex pad 2x60sec, static, then vertical head turns 1x25, horizontal head turns 2x20, velocity increased 2nd set -airex beam - LTL gait and tandem gait x multiple reps of each 5x each  -airex stance alternate step taps alternating blue med ball self toss/catch x20    PATIENT EDUCATION: Ways to gradually progress interventions today based off symptoms response.   HOME EXERCISE PROGRAM: pt to continue HEP as previously given 07/27/22: Access Code: [email or web address] URL: [email or web address] Date: 07/27/2022 Prepared by: Ricard Dillon  Exercises - Seated Toe Raise  - 1 x daily - 6 x weekly - 2 sets - 20 reps - Seated Heel Raise  - 1 x daily - 6 x weekly - 2 sets - 20 reps   Access Code: [email or web address] URL: [email or web address] Date: 07/09/2022 Prepared by: Ricard Dillon  Exercises - Seated Gaze Stabilization with Head Rotation  - 1 x daily - 7 x weekly - 3 sets - 2 reps - 30 sec hold  GOALS: Goals reviewed with patient? Initiated goal review with patient, further review to be completed with testing next visit    SHORT TERM GOALS: Target date:  09/17/2022   Patient will be independent in home exercise program to improve strength/mobility for better functional independence with ADLs. Baseline: Goal status: INITIAL   LONG TERM GOALS: Target date: 10/29/2022    Patient will increase FOTO score to equal to or greater than 68 to demonstrate s improvement in mobility and quality of life.  Baseline: 62 Goal status: INITIAL  2.  Patient will increase ABC scale score >80% to demonstrate better functional mobility and better confidence with ADLs.  Baseline: 84.4% Goal  status: INITIAL  3.  Patient will increase dynamic gait index score to >19/24 as to demonstrate reduced fall risk and improved dynamic gait balance for better safety with community/home ambulation.   Baseline: to be completed next 1-2 visits Goal status: INITIAL  4. The pt will ambulate at least 1.0 m/s with LRD on 10MWT to indicate functional community ambulation.  Baseline: to be completed next 1-2 visits. 07/15/22: 0.81 m/s  Goal status: INITIAL    ASSESSMENT:  CLINICAL IMPRESSION: Continued with interventions to optimize function of vestibular system. Pt able to advance intensity of many interventions, although unclear how much of this advancement is related to recent medication changes. The pt will benefit from further skilled PT to address these deficits in order to improve balance and decrease movement-triggered dizziness and imbalance.    OBJECTIVE IMPAIRMENTS: Abnormal gait, decreased balance, decreased mobility, difficulty walking, dizziness, impaired sensation, improper body mechanics, and pain.   ACTIVITY LIMITATIONS: bending, squatting, stairs, reach over head, and locomotion level  PARTICIPATION LIMITATIONS: meal prep, cleaning, shopping, and community activity  PERSONAL FACTORS: Age, Fitness, Past/current experiences, Time since onset of injury/illness/exacerbation, and 3+ comorbidities: PMH significant for the following: arthritis, CHF, coronary artery  disease, DDD, hypercholesteremia, HTN, L foot drop, NSTEMI, OSA on CPAP, postlaminectomy syndrome S/P CAB x2 2016, DMII, low back pain, lumbar radiculopathy, vertigo, unstable angina, BPPV, L hip pain, AKI, Vitamin D deficiency, pain in joint of L knee, lateral epicondylitis, please refer to chart for full details.   are also affecting patient's functional outcome.   REHAB POTENTIAL: Good  CLINICAL DECISION MAKING: Evolving/moderate complexity  EVALUATION COMPLEXITY: Moderate     PLAN:  PT FREQUENCY: 2x/week  PT DURATION: 12 weeks  PLANNED INTERVENTIONS: Therapeutic exercises, Therapeutic activity, Neuromuscular re-education, Balance training, Gait training, Patient/Family education, Self Care, Joint mobilization, Stair training, Vestibular training, Canalith repositioning, Visual/preceptual remediation/compensation, Orthotic/Fit training, DME instructions, Wheelchair mobility training, Spinal mobilization, Cryotherapy, Moist heat, Splintting, Taping, Manual therapy, and Re-evaluation  PLAN FOR NEXT SESSION: VOR, balance, strength   Layton Naves C, PT 08/06/2022, 9:56 AM  9:56 AM, 08/06/22 Etta Grandchild, PT, DPT Physical Therapist - [place] [phone]

## 2022-08-10 DIAGNOSIS — G4733 Obstructive sleep apnea (adult) (pediatric): Secondary | ICD-10-CM | POA: Diagnosis not present

## 2022-08-12 ENCOUNTER — Ambulatory Visit: Payer: Medicare PPO

## 2022-08-12 NOTE — Telephone Encounter (Signed)
Pt called in for assistance. Pt says that he has a week left on his Trulicity medication. Pt says that he doesn't want to give out. Pt says that he was told to have medication changed to something that [employer] (his insurance) covers.   Pt would like assistance from [place] if possible. Pt says that he was able to assist him with this concern before.     Please assist pt further.

## 2022-08-13 ENCOUNTER — Telehealth: Payer: Self-pay

## 2022-08-13 DIAGNOSIS — E1151 Type 2 diabetes mellitus with diabetic peripheral angiopathy without gangrene: Secondary | ICD-10-CM

## 2022-08-13 MED ORDER — OZEMPIC (0.25 OR 0.5 MG/DOSE) 2 MG/3ML ~~LOC~~ SOPN: 0.5000 mg | PEN_INJECTOR | SUBCUTANEOUS | 0 refills | Status: DC

## 2022-08-13 NOTE — Telephone Encounter (Signed)
Rx for Ozempic was sent into [facility].

## 2022-08-13 NOTE — Addendum Note (Signed)
Addended by: Daron Offer A on: 08/13/2022 03:57 PM   Modules accepted: Orders

## 2022-08-13 NOTE — Progress Notes (Signed)
Per Clinical pharmacist, patient  is having an issue with [facility] deliverying his Trulicity. It looks like they are out of Trulicity right now and did not approve switching him to Ozempic. Can you call [facility] and see what you can find out about his medication? We may need to get him a sample while [facility] is out of supply if we can't find a solution.  I [facility] to [facility] as requested from Clinical pharmacist. Per [facility], there is a Producer, television/film/video regarding Trulicity at this time.Patient can either see if there another pharmacy  may have Trulicity in  stock or can try something else. Per [facility], all they needed for Ozempic was prior authorization which approved , and they just  need  a script for ozempic for that to be fill now.They are unsure how long the shortness will be  for Trulicity.Notified Clinical pharmacist.    Anderson [place] Clinical Pharmacist Assistant [phone]

## 2022-08-14 ENCOUNTER — Telehealth: Payer: Self-pay

## 2022-08-14 NOTE — Progress Notes (Signed)
Per Clinical pharmacist, please reach out to [facility] to confirm they receive the script for Ozempic, and they are able to deliver it. Please inform patient I sent in prescription for Ozempic 0.5 mg weekly.   Per [facility],they did receive the prescription for Ozempic,and they are able process it.They only ship medication on Tuesday,Wednesday ,and Thursday of each week, and once it is ship it can take 7 -10 business days for the patient to receive it.I request his shipment to be expedited since the patient only has a week left of Trulicity. Per [facility],she was able to expedite his order for Ozempic,and the patient should receive his shipment next week.  I left a voice message informing the patient of the above information, and my contact information for him to return my call  incase he has any questions.   [place] Pharmacist Assistant [phone]

## 2022-08-16 ENCOUNTER — Other Ambulatory Visit: Payer: Self-pay | Admitting: Family Medicine

## 2022-08-16 DIAGNOSIS — L409 Psoriasis, unspecified: Secondary | ICD-10-CM

## 2022-08-17 ENCOUNTER — Telehealth: Payer: Self-pay

## 2022-08-17 ENCOUNTER — Ambulatory Visit: Payer: Medicare PPO | Attending: Neurology

## 2022-08-17 DIAGNOSIS — R262 Difficulty in walking, not elsewhere classified: Secondary | ICD-10-CM | POA: Insufficient documentation

## 2022-08-17 NOTE — Telephone Encounter (Signed)
Called pt due to missed visit/no-show today. PT called pt via secure phone line and left VM with upcoming visit date/time and clinic contact information.   Ricard Dillon PT, DPT

## 2022-08-18 NOTE — Telephone Encounter (Signed)
Requested medication (s) are due for refill today: yes  Requested medication (s) are on the active medication list: yes    Last refill: 07/29/21 195m  1 refill  Future visit scheduled no  Notes to clinic:Not delegated, please review. Thank you.  Requested Prescriptions  Pending Prescriptions Disp Refills   clobetasol (TEMOVATE) 0.05 % external solution [Pharmacy Med Name: CLOBETASOL PROPIONATE SOLN 25ML 0.05%] 100 mL 6    Sig: APPLY 1 APPLICATION TOPICALLY TWICE A DAY     Not Delegated - Dermatology:  Corticosteroids Failed - 08/16/2022  6:16 PM      Failed - This refill cannot be delegated      Passed - Valid encounter within last 12 months    Recent Outpatient Visits           7 months ago AKI (acute kidney injury) ([facility]   [place], FNP   8 months ago Chronic diastolic CHF (congestive heart failure) ([facility]   [place], FNP   1 year ago Psoriasis of scalp   [place] T, [place]  1 year ago History of recent fall   [place] Joe[place], FNP   1 year ago Type 2 diabetes mellitus with diabetic peripheral angiopathy without gangrene, without long-term current use of insulin ([facility]   [place], ADionne Bucy MD       Future Appointments             In 1 month AJonathon Bellows MD [facility]

## 2022-08-19 ENCOUNTER — Ambulatory Visit: Payer: Medicare PPO

## 2022-08-20 ENCOUNTER — Ambulatory Visit: Payer: Medicare PPO

## 2022-08-20 DIAGNOSIS — R262 Difficulty in walking, not elsewhere classified: Secondary | ICD-10-CM | POA: Diagnosis not present

## 2022-08-20 NOTE — Therapy (Signed)
OUTPATIENT PHYSICAL THERAPY VESTIBULAR TREATMENT NOTE/DISCHARGE     Patient Name: Jonathon Newman MRN: [id number] DOB:02/01/1956, 66 y.o., male Today's Date: 08/20/2022  END OF SESSION:  PT End of Session - 08/20/22 1406     Visit Number 7    Number of Visits 25    Date for PT Re-Evaluation 10/01/22    Authorization Type [employer] Medicare; Tricare for life secondary    Authorization Time Period 07/09/22-10/02/22    Progress Note Due on Visit 10    PT Start Time 1330    PT Stop Time 1400    PT Time Calculation (min) 30 min    Equipment Utilized During Treatment Gait belt    Activity Tolerance Patient tolerated treatment well    Behavior During Therapy WFL for tasks assessed/performed               Past Medical History:  Diagnosis Date   Anginal pain ([place])    Arthritis    BPH (benign prostatic hyperplasia)    CHF (congestive heart failure) ([place])    Coronary artery disease 2010   a.) [place] 2010: high grade RCA stenosis -> 2.5 x 61m Cypher DES to RCA. b.) NSTEMI 2016 -> PCI revealed pat RCA stent; sig dLM Dz with FFR ratio 0.76 and oLCx stenosis; ref to CVTS. c.) 2v CABG 04/21/2015; LIMA-LAD, SVG-OM2. [facility] EF 55-65%; LVEDP 14-15 mmHg; 10% p-m RCA, 30% pRCA, 20% dRCA, 95% o-pLCx, 80% m-dLM; LIMA graft pat. SVG graft with mild diffuse Dz.   DDD (degenerative disc disease), lumbar    Dyspnea    GERD (gastroesophageal reflux disease)    Hypercholesteremia    Hypertension    Left foot drop    NSTEMI (non-ST elevated myocardial infarction) [place]   a.) PCI revealed patent RCA stent; significant dLM disease with FFR ratio 0.76 and oLCx stenosis; refer to CVTS for CABG.   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 LIMA to LAD amd SVG to OM2   CORONARY STENT PLACEMENT  2010   [place]ordis Cypher Sirolimus-eluting stent 2.50 mm [place]   Procedure: ESOPHAGEAL MANOMETRY (EM);  Surgeon: VLin Landsman MD;  Location: [facility] ENDOSCOPY;  Service: Endoscopy;  Laterality: N/A;   ESOPHAGOGASTRODUODENOSCOPY (EGD) WITH PROPOFOL N/A 06/17/2022   Procedure: ESOPHAGOGASTRODUODENOSCOPY (EGD) WITH PROPOFOL;  Surgeon: AJonathon Bellows MD;  [facility];  Service: Gastroenterology;  Laterality: N/A;   FRACTURE SURGERY     KNEE SURGERY     KNEE SURGERY     right knee    LAMINECTOMY     "plate in neck [id number]   LEFT HEART CATH AND CORS/GRAFTS ANGIOGRAPHY N/A 05/02/2018   Procedure: CORS/GRAFTS ANGIOGRAPHY;  Surgeon: AWellington Hampshire MD;  [facility];  Service: Cardiovascular;  Laterality: N/A;   RIGHT/LEFT HEART CATH AND CORONARY ANGIOGRAPHY Bilateral 05/02/2018   Procedure:  LEFT HEART CATH;  Surgeon: Wellington Hampshire, MD;  Location: [place] CV LAB;  Service: Cardiovascular;  Laterality: Bilateral;   THORACIC LAMINECTOMY FOR SPINAL CORD STIMULATOR N/A 05/26/2021   Procedure: THORACIC SPINAL CORD STIMULATOR AND PULSE GENERATOR PLACEMENT (MEDTRONIC);  Surgeon: Deetta Perla, MD;  Location: [facility] ORS;  Service: Neurosurgery;  Laterality: N/A;   VASECTOMY     Patient Active Problem List   Diagnosis Date Noted   CAD (coronary artery disease) 06/01/2022   Vitamin D deficiency 06/01/2022   Snoring 06/01/2022   Other specified counseling 06/01/2022   Lateral epicondylitis (tennis elbow) 06/01/2022   History of heartburn 06/01/2022   Contact dermatitis 06/01/2022   Chronic pharyngitis 06/01/2022   Abdominal tenderness, right upper quadrant 06/01/2022   Pain in joint,  forearm 06/01/2022   Rash and other nonspecific skin eruption 06/01/2022   Pain in joint of left knee 04/20/2022   Swelling of limb 01/23/2022   3+ pitting edema 01/09/2022   AKI (acute kidney injury) ([place]) 01/09/2022   Left hip pain 12/19/2021   Chronic diastolic CHF (congestive heart failure) ([place]) 12/19/2021   Localized edema 12/19/2021   Morton's neuroma of both feet 07/01/2021   DDD (degenerative disc disease), lumbosacral 07/01/2021   Psoriasis of scalp 07/01/2021   Essential hypertension 03/31/2021   Type 2 diabetes mellitus with diabetic peripheral angiopathy without gangrene, without long-term current use of insulin ([place]) 03/31/2021   History of recent fall 03/31/2021   Foot drop, left 03/31/2021   BMI 33.0-33.9,adult 03/31/2021   Hyponatremia 02/14/2021   Hyperlipidemia associated with type 2 diabetes mellitus ([place]) 12/27/2020   Elevated transaminase level 10/04/2020   Morbid obesity ([place]) 05/20/2020   T2DM (type 2 diabetes mellitus) ([place]) 07/17/2019   Strain of foot 05/09/2019   Benign paroxysmal positional vertigo due to bilateral vestibular disorder 03/15/2019   Abnormal findings on diagnostic imaging of lung 03/15/2019   Chest pain 03/10/2019   Pain in right knee 09/10/2018   Unstable angina (HCC) 04/25/2018   SOB (shortness of breath) 04/13/2018   Vertigo 04/13/2018   S/P coronary artery bypass graft x 2    Incomplete bladder emptying 07/21/2017   Low back pain 07/09/2017   Lumbar radiculopathy 07/09/2017   Gastroesophageal reflux disease with esophagitis 07/05/2017   Congestive heart failure ([place]) 07/05/2017   Benign prostatic hyperplasia with weak urinary stream 07/05/2017   Dysphagia 07/05/2017   DDD (degenerative disc disease), lumbar 07/05/2017   Hypertension associated with type 2 diabetes mellitus ([place]) 07/31/2015   OSA (obstructive sleep apnea) 07/31/2015    PCP: Gwyneth Sprout, FNP  REFERRING PROVIDER: Anabel Bene, MD   REFERRING DIAG: R42  (ICD-10-CM) - Dizziness   THERAPY DIAG:  Difficulty in walking, not elsewhere classified  ONSET DATE: Reports onset of approximately 1 year ago (2023)  Rationale for Evaluation and Treatment: Rehabilitation  SUBJECTIVE:   SUBJECTIVE STATEMENT: Pt reports things are still going well since new medication change. He reports he has a couple instances of dizziness but this occurred when he didn't take the medication, otherwise he has not been dizzy.  Pt reports his balance has normally been pretty good but not so much today because of the pain. He is currently using his SPC. Pt reports he feels ready to discharge from PT.  PERTINENT HISTORY:  Pt is a 67 y/o male presenting to PT for chronic dizziness. He is using a SPC to ambulate. He reports onset over a year ago. Pt states "there's days it's just crazy,  and days I can do it." Dizziness can start 5-10 minutes after lying down. Dizziness occurs with sit>stand. To be careful, he will sit for 2-3 minutes prior to standing. Pt can experience spinning while ambulating. He reports nature of dizziness is typically a spinning sensation that lasts for 15-20 seconds. Frequency of dizziness is random. Most recent episode was this past Tuesday when lying down. Reports he can go a week before having another episode. Rolling over does not cause spinning, tilting his head back causes him to feel unsteady. He reports he has tried canalith repositioning maneuvers in past without success. Pt did have migraines when younger (30 years ago), seldom gets headaches now. Pt does take aspirin for his heart and Tyelenol (hx of CAB x2, 2016). Pt also had stimulator placed (thoracic, 2022) due to back problems that caused difficulty walking and maintaining balance. Pt reports hx LE weakness but no hx of stroke. Pt has fallen multiple times and 2x in past six months. He denies hearing loss, does have B tinnitus (low frequency), and reports no ear pain. He reports no unusual changes  in vision. Pt can feel lightheaded multiple times/month, says it is random. 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 He has difficulty navigating stairs. 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 PMH significant for the following: arthritis, CHF, coronary artery disease, DDD, hypercholesteremia, HTN, L foot drop, NSTEMI, OSA on CPAP, postlaminectomy syndrome S/P CAB x2 2016, DMII, low back pain, lumbar radiculopathy, vertigo, BPPV, L hip pain, AKI, Vitamin D deficiency, pain in joint of L knee, lateral epicondylitis, please refer to chart for full details.   PAIN: Yes, typical daily pain, does not rate; also reports HA at 1/10 at present.   PRECAUTIONS: Fall  WEIGHT BEARING RESTRICTIONS: No  FALLS: Has patient fallen in last 6 months? Yes. Number of falls 2, no head impact or injuries with falls  PATIENT GOALS: reduce dizziness  OBJECTIVE:   INTERVENTION THIS DATE 08/20/22:  TherAct: Goal reassessment completed for discharge note. Please refer to goal section below for details.    PATIENT EDUCATION: Discharge recommendations.  HOME EXERCISE PROGRAM: pt to continue HEP as previously given 07/27/22: Access Code: [email or web address] URL: [email or web address] Date: 07/27/2022 Prepared by: Ricard Dillon  Exercises - Seated Toe Raise  - 1 x daily - 6 x weekly - 2 sets - 20 reps - Seated Heel Raise  - 1 x daily - 6 x weekly - 2 sets - 20 reps   Access Code: [email or web address] URL: [email or web address] Date: 07/09/2022 Prepared by: Ricard Dillon  Exercises - Seated Gaze Stabilization with Head Rotation  - 1 x daily - 7 x weekly - 3 sets - 2 reps - 30 sec hold  GOALS: Goals reviewed with patient? Initiated goal review with patient, further review to be completed with testing next visit    SHORT TERM GOALS: Target date: 10/01/2022   Patient will be independent in home exercise program to improve strength/mobility for better functional  independence with ADLs. Baseline:08/20/22: pt understands what to do post-discharge Goal status: MET   LONG TERM GOALS: Target date: 11/12/2022    Patient will increase FOTO score to equal to or greater than 68 to demonstrate s improvement in mobility and quality of life.  Baseline: 62; 08/20/2022: 67  Goal status: Partially met  2.  Patient will increase ABC scale score >80% to demonstrate better functional mobility and better confidence with ADLs.  Baseline: 84.4% Goal status: original score greater than  80%  3.  Patient will increase dynamic gait index score to >19/24 as to demonstrate reduced fall risk and improved dynamic gait balance for better safety with community/home ambulation.   Baseline: to be completed next 1-2 visits; 3/7: 21/24  Goal status: MET  4. The pt will ambulate at least 1.0 m/s with LRD on 10MWT to indicate functional community ambulation.  Baseline: to be completed next 1-2 visits. 07/15/22: 0.81 m/s; 08/20/2022 0.47 with SPC and antalgic today   Goal status: ongoing    ASSESSMENT:  CLINICAL IMPRESSION: Pt presents to PT reporting dizziness has about completely resolved since starting new medication and that he feels ready for discharge. Goals reassessed and pt has met DGI goal and partially met FOTO goal, indicating low fall risk and improved perception of functional mobility and QOL. Pt saw decrease only on 10MWT likely due to increased hip pain today.  Pt agreeable to plan to discharge from vestibular PT. The pt does not require further skilled vestibular PT at this time.   OBJECTIVE IMPAIRMENTS: Abnormal gait, decreased balance, decreased mobility, difficulty walking, dizziness, impaired sensation, improper body mechanics, and pain.   ACTIVITY LIMITATIONS: bending, squatting, stairs, reach over head, and locomotion level  PARTICIPATION LIMITATIONS: meal prep, cleaning, shopping, and community activity  PERSONAL FACTORS: Age, Fitness, Past/current  experiences, Time since onset of injury/illness/exacerbation, and 3+ comorbidities: PMH significant for the following: arthritis, CHF, coronary artery disease, DDD, hypercholesteremia, HTN, L foot drop, NSTEMI, OSA on CPAP, postlaminectomy syndrome S/P CAB x2 2016, DMII, low back pain, lumbar radiculopathy, vertigo, unstable angina, BPPV, L hip pain, AKI, Vitamin D deficiency, pain in joint of L knee, lateral epicondylitis, please refer to chart for full details.   are also affecting patient's functional outcome.   REHAB POTENTIAL: Good  CLINICAL DECISION MAKING: Evolving/moderate complexity  EVALUATION COMPLEXITY: Moderate     PLAN:  PT FREQUENCY: 2x/week  PT DURATION: 12 weeks  PLANNED INTERVENTIONS: Therapeutic exercises, Therapeutic activity, Neuromuscular re-education, Balance training, Gait training, Patient/Family education, Self Care, Joint mobilization, Stair training, Vestibular training, Canalith repositioning, Visual/preceptual remediation/compensation, Orthotic/Fit training, DME instructions, Wheelchair mobility training, Spinal mobilization, Cryotherapy, Moist heat, Splintting, Taping, Manual therapy, and Re-evaluation  PLAN FOR NEXT SESSION: VOR, balance, strength    08/20/2022, 2:17 PM  2:17 PM, 08/20/22  Physical Therapist - [place] [phone]

## 2022-08-24 ENCOUNTER — Other Ambulatory Visit: Payer: Self-pay | Admitting: Family Medicine

## 2022-08-25 ENCOUNTER — Ambulatory Visit: Payer: Medicare PPO

## 2022-08-25 NOTE — Telephone Encounter (Signed)
Requested medication (s) are due for refill today- expired Rx  Requested medication (s) are on the active medication list -yes  Future visit scheduled -no  Last refill: 08/04/21 #90 3RF  Notes to clinic: expired Rx  Requested Prescriptions  Pending Prescriptions Disp Refills   pantoprazole (PROTONIX) 40 MG tablet [Pharmacy Med Name: PANTOPRAZOLE SODIUM DR TABS '40MG'$ ] 90 tablet 3    Sig: TAKE 1 TABLET DAILY     Gastroenterology: Proton Pump Inhibitors Passed - 08/24/2022  9:29 AM      Passed - Valid encounter within last 12 months    Recent Outpatient Visits           7 months ago AKI (acute kidney injury) [facility])   [place] Tally Joe T, FNP   8 months ago Chronic diastolic CHF (congestive heart failure) [facility])   [place] Tally Joe T, FNP   1 year ago Psoriasis of scalp   [place] Tally Joe T, FNP   1 year ago History of recent fall   [place] Tally Joe T, FNP   1 year ago Type 2 diabetes mellitus with diabetic peripheral angiopathy without gangrene, without long-term current use of insulin [facility])   [place], Dionne Bucy, MD       Future Appointments             In 1 month Jonathon Bellows, [place] [facility] [place]   pantoprazole ([place]) 40 MG tablet [Pharmacy Med Name: PANTOPRAZOLE SODIUM DR TABS '40MG'$ ] 90 tablet 3    Sig: TAKE 1 TABLET DAILY     Gastroenterology: Proton Pump Inhibitors Passed - 08/24/2022  9:29 AM      Passed - Valid encounter within last 12 months    Recent Outpatient Visits           7 months ago AKI (acute kidney injury) [facility])   [place] Tally Joe T, FNP   8 months ago Chronic diastolic CHF (congestive heart failure) [facility])   [place] Tally Joe T, FNP   1 year ago Psoriasis of scalp   [place] Tally Joe T, [place]   1 year ago History of recent fall   [place] Tally Joe T, FNP   1 year ago Type 2 diabetes mellitus with diabetic peripheral angiopathy without gangrene, without long-term current use of insulin [facility])   [place], Dionne Bucy, MD       Future Appointments             In 1 month Jonathon Bellows, MD [place] [facility] at [facility]

## 2022-08-26 ENCOUNTER — Ambulatory Visit: Payer: Medicare PPO

## 2022-08-31 ENCOUNTER — Ambulatory Visit: Payer: Medicare PPO

## 2022-09-02 ENCOUNTER — Ambulatory Visit: Payer: Medicare PPO

## 2022-09-03 ENCOUNTER — Ambulatory Visit: Payer: Medicare PPO

## 2022-09-07 ENCOUNTER — Ambulatory Visit: Payer: Medicare PPO

## 2022-09-08 ENCOUNTER — Ambulatory Visit: Payer: Medicare PPO | Admitting: Podiatry

## 2022-09-09 ENCOUNTER — Ambulatory Visit: Payer: Medicare PPO

## 2022-09-10 ENCOUNTER — Ambulatory Visit: Payer: Medicare PPO

## 2022-09-15 ENCOUNTER — Telehealth: Payer: Self-pay

## 2022-09-15 NOTE — Progress Notes (Unsigned)
Care Management & Coordination Services Pharmacy Team  Reason for Encounter: Diabetes  Contacted patient to discuss diabetes disease state. {US HC Outreach:28874}  Current antihyperglycemic regimen:  Metformin ER 500 mg 2 tablets twice daily  Ozempic 0.5 mg weekly    Patient verbally confirms he is taking the above medications as directed. {yes/no:20286}  What diet changes have been made to improve diabetes control?  What recent interventions/DTPs have been made to improve glycemic control:  None ID  Have there been any recent hospitalizations or ED visits since last visit with PharmD? No  Patient {reports/denies:24182} hypoglycemic symptoms, including {Hypoglycemic Symptoms:3049003}  Patient {reports/denies:24182} hyperglycemic symptoms, including {symptoms; hyperglycemia:17903}  How often are you checking your blood sugar? {BG Testing frequency:23922}  What are your blood sugars ranging?  Fasting: *** Before meals: *** After meals: *** Bedtime: ***  During the week, how often does your blood glucose drop below 70? {LowBGfrequency:24142}  Are you checking your feet daily/regularly? {yes/no:20286}  Schedule follow up!!!!  Adherence Review: Is the patient currently on a STATIN medication? Yes Is the patient currently on ACE/ARB medication? No Does the patient have >5 day gap between last estimated fill dates? No  Care Gaps: Annual wellness visit in last year? No-Never done   If Diabetic: Last eye exam / retinopathy screening: Never done Last diabetic foot exam:Last completed 07/01/2022 Last UACR: Last completed 07/17/2019 Colonoscopy Shingrix Vaccine COVID-19 Vaccine Pneumonia Vaccine Dtap Vaccine HEMOGLOBIN A1C HTN 155/82 - 01/09/2022   Star Rating Drugs: Atorvastatin 40 mg last filled 06/30/2022 for 90 day supply at [facility]. Metformin 500 mg last filled 05/01/2022 for 90 day supply at [facility]. Trulicity 1.5 mg last filled 08/02/2022 for  84 day supply at [facility] Ozempic 0.25 mg last filled 08/13/2022 for 84 day supply at [facility]  Chart Updates:  Recent office visits:  None ID  Recent consult visits:  08/20/2022 Ricard Dillon PT (Physical Therapy) No medication changes noted 08/06/2022 Rebbeca Paul PT (Physical Therapy) No Medication changes noted 07/29/2022 Ricard Dillon PT (Physical Therapy) No medication changes noted 07/28/2022 Dr. Melrose Nakayama MD (Neurology) Stop meclizine, Start Valium 2 mg 2-3 times daily for vertigo,Follow up 6-8 weeks  07/27/2022 Ricard Dillon PT (Physical Therapy) No medication changes noted 07/20/2022  Ricard Dillon PT (Physical Therapy) No medication changes noted 07/15/2022  Ricard Dillon PT (Physical Therapy) No medication changes noted 07/14/2022 Dr. Rockey Situ MD (Cardiology) No medication changes noted 07/09/2022 Ricard Dillon PT (Physical Therapy) No medication changes noted  Hospital visits:  None in previous 6 months  Medications: Outpatient Encounter Medications as of 09/15/2022  Medication Sig   acetaminophen (TYLENOL) 500 MG tablet Take 1,000 mg by mouth every 8 (eight) hours as needed for mild pain or moderate pain. Takes once a day   aspirin EC 81 MG tablet Take 81 mg by mouth daily. Swallow whole.   atorvastatin (LIPITOR) 40 MG tablet Take 1 tablet (40 mg total) by mouth daily.   Blood Glucose Monitoring Suppl (FREESTYLE LITE) DEVI To check blood sugar once daily   carvedilol (COREG) 25 MG tablet Take 1 tablet (25 mg total) by mouth 2 (two) times daily with a meal.   clobetasol (TEMOVATE) 0.05 % external solution APPLY 1 APPLICATION TOPICALLY TWICE A DAY   ezetimibe (ZETIA) 10 MG tablet TAKE 1 TABLET DAILY   FREESTYLE LITE test strip USE TO CHECK BLOOD SUGAR ONCE DAILY   isosorbide mononitrate (IMDUR) 60 MG 24 hr tablet TAKE 1 TABLET DAILY   ketoconazole (NIZORAL) 2 %  shampoo APPLY 1 APPLICATION TOPICALLY 2 TIMES WEEKLY AS DIRECTED   Lancets (FREESTYLE) lancets USE TO CHECK BLOOD  SUGAR ONCE DAILY   meclizine (ANTIVERT) 12.5 MG tablet Take 12.5 mg by mouth 2 (two) times daily. (Patient not taking: Reported on 07/14/2022)   metFORMIN (GLUCOPHAGE-XR) 500 MG 24 hr tablet TAKE 2 TABLETS TWICE A DAY WITH MEALS   Multiple Vitamin (MULTIVITAMIN WITH MINERALS) TABS tablet Take 1 tablet by mouth daily.   nitroGLYCERIN (NITROSTAT) 0.4 MG SL tablet Place 1 tablet (0.4 mg total) under the tongue every 5 (five) minutes as needed for chest pain.   pantoprazole (PROTONIX) 40 MG tablet TAKE 1 TABLET DAILY   potassium chloride (KLOR-CON) 10 MEQ tablet TAKE 2 TABLETS TWICE A DAY   pregabalin (LYRICA) 150 MG capsule TAKE 1 CAPSULE BY MOUTH TWO TIMES A DAY   sacubitril-valsartan (ENTRESTO) 24-26 MG Take 1 tablet by mouth 2 (two) times daily.   Semaglutide,0.25 or 0.5MG /DOS, (OZEMPIC, 0.25 OR 0.5 MG/DOSE,) 2 MG/3ML SOPN Inject 0.5 mg into the skin once a week.   senna (SENOKOT) 8.6 MG TABS tablet Take 1 tablet by mouth daily.   torsemide (DEMADEX) 20 MG tablet Take 40 mg by mouth daily.   No facility-administered encounter medications on file as of 09/15/2022.    Recent Relevant Labs: Lab Results  Component Value Date/Time   HGBA1C 6.0 (H) 11/06/2021 09:48 AM   HGBA1C 6.4 (H) 02/14/2021 03:24 PM   MICROALBUR 50 07/17/2019 07:26 PM    Kidney Function Lab Results  Component Value Date/Time   CREATININE 1.29 (H) 02/10/2022 09:33 AM   CREATININE 1.14 01/09/2022 11:29 AM   GFRNONAA >60 02/10/2022 09:33 AM   GFRAA 72 06/28/2020 09:42 AM    [place] Clinical Pharmacist Assistant [phone]

## 2022-09-16 ENCOUNTER — Telehealth: Payer: Self-pay | Admitting: Family Medicine

## 2022-09-16 NOTE — Telephone Encounter (Signed)
Contacted Myrtie Cruise to schedule their annual wellness visit. Appointment made for 09/23/2022.  [place] Direct Dial: [phone]

## 2022-09-22 ENCOUNTER — Ambulatory Visit: Payer: Medicare PPO

## 2022-09-23 ENCOUNTER — Ambulatory Visit: Payer: Medicare PPO

## 2022-09-23 ENCOUNTER — Telehealth: Payer: Self-pay

## 2022-09-23 NOTE — Telephone Encounter (Signed)
09/23/2022 03:09 PM EDT by Sue Lush, LPN  Outgoing Jonathon Newman, Jonathon Newman (Self) [phone] (Home) Remove  Left Message - left msg x2 to rtn call to practice to r/s

## 2022-09-25 NOTE — Progress Notes (Unsigned)
Patient ID: Jonathon Newman, male    DOB: April 26, 1956, 67 y.o.   MRN: [id number]  Primary cardiologist: Julien Nordmann, MD (last seen 01/24) PCP: Jacky Kindle, FNP (last seen 07/23; returns in 2 days)  HPI  Mr Jonathon Newman is a 67 y/o male with a history of CAD, DM, hyperlipidemia, HTN, BPH, GERD, previous tobacco use and chronic heart failure.   Echo 12/11/21: EF of 55-60%  LHC 05/02/18:  The left ventricular systolic function is normal. LV end diastolic pressure is mildly elevated. The left ventricular ejection fraction is 55-65% by visual estimate. Prox RCA to Mid RCA lesion is 10% stenosed. Prox RCA lesion is 30% stenosed. Dist RCA lesion is 20% stenosed. SVG graft was visualized by angiography. The graft exhibits mild diffuse disease. Ost Cx to Prox Cx lesion is 95% stenosed. Mid LM to Dist LM lesion is 80% stenosed. LIMA and is normal in caliber. The graft exhibits no disease.  1. Significant underlying left main and RCA disease with patent grafts including LIMA to LAD and SVG to OM 2.  Patent RCA stent with minimal restenosis. 2.  Normal LV systolic function.  Mildly elevated left ventricular end-diastolic pressure at 14 to 15 mmHg.  Has not been admitted or been in the ED in the last 6 months.   He presents today for a HF follow-up visit with a chief complaint of moderate fatigue with minimal exertion. Chronic in nature. Has SOB, pedal edema, chronic back pain, dizziness, tingling in left jaw and weakness along with this. Denies difficulty sleeping, abdominal distention, palpitations, chest pain, wheezing, cough or weight gain.   Has been having intermittent tingling in the left side of his jaw which then radiates to left clavicle and then upper shoulder. Resolves on its own. Had his muscles massaged in that area but without any relief. Does have a back stimulator that was placed 12/22 and he intermittent has to turn it up.   Continues to drink too much fluids but says that his  mouth stays so dry. Drinking 3.5-5 twenty ounce bottles of water and 2 cups of coffee daily. Using NoSalt for seasoning.  Occasionally takes a PM dose of torsemide. Has compression socks but doesn't always wear them.   Past Medical History:  Diagnosis Date   Anginal pain (HCC)    Arthritis    BPH (benign prostatic hyperplasia)    CHF (congestive heart failure) (HCC)    Coronary artery disease 2010   a.) LHC 2010: high grade RCA stenosis -> 2.5 x 26mm Cypher DES to RCA. b.) NSTEMI 2016 -> PCI revealed pat RCA stent; sig dLM Dz with FFR ratio 0.76 and oLCx stenosis; ref to CVTS. c.) 2v CABG 04/21/2015; LIMA-LAD, SVG-OM2. d.) [facility] 05/02/2018: EF 55-65%; LVEDP 14-15 mmHg; 10% p-m RCA, 30% pRCA, 20% dRCA, 95% o-pLCx, 80% m-dLM; LIMA graft pat. SVG graft with mild diffuse Dz.   DDD (degenerative disc disease), lumbar    Dyspnea    GERD (gastroesophageal reflux disease)    Hypercholesteremia    Hypertension    Left foot drop    NSTEMI (non-ST elevated myocardial infarction) (HCC) 2016   a.) PCI revealed patent RCA stent; significant dLM disease with FFR ratio 0.76 and oLCx stenosis; refer to CVTS for CABG.   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 LIMA to LAD amd SVG to OM2   CORONARY STENT PLACEMENT  2010   Cordis Cypher Sirolimus-eluting stent 2.50 mm x 26 mm placed to the RCA at [facility]    ESOPHAGEAL MANOMETRY N/A 08/04/2017   Procedure: ESOPHAGEAL MANOMETRY (EM);  Surgeon: Toney Reil, MD;  Location: [facility] ENDOSCOPY;  Service: Endoscopy;  Laterality: N/A;    ESOPHAGOGASTRODUODENOSCOPY (EGD) WITH PROPOFOL N/A 06/17/2022   Procedure: ESOPHAGOGASTRODUODENOSCOPY (EGD) WITH PROPOFOL;  Surgeon: [name], MD;  Location: [facility] ENDOSCOPY;  Service: Gastroenterology;  Laterality: N/A;   FRACTURE SURGERY     KNEE SURGERY     KNEE SURGERY     right knee    LAMINECTOMY     "plate in neck B1-Y7"   LEFT HEART CATH AND CORS/GRAFTS ANGIOGRAPHY N/A 05/02/2018   Procedure: CORS/GRAFTS ANGIOGRAPHY;  Surgeon: Iran Ouch, MD;  Location: [facility] INVASIVE CV LAB;  Service: Cardiovascular;  Laterality: N/A;   RIGHT/LEFT HEART CATH AND CORONARY ANGIOGRAPHY Bilateral 05/02/2018   Procedure: LEFT HEART CATH;  Surgeon: Iran Ouch, MD;  Location: [facility] INVASIVE CV LAB;  Service: Cardiovascular;  Laterality: Bilateral;   THORACIC LAMINECTOMY FOR SPINAL CORD STIMULATOR N/A 05/26/2021   Procedure: THORACIC SPINAL CORD STIMULATOR AND PULSE GENERATOR PLACEMENT (MEDTRONIC);  Surgeon: Lucy Chris, MD;  Location: [facility] ORS;  Service: Neurosurgery;  Laterality: N/A;   VASECTOMY     Family History  Problem Relation Age of Onset   Arthritis Mother    Hyperlipidemia Father    Hypertension Father    Heart attack Father 69   Heart murmur Brother    Valvular heart disease Brother    Cataracts Maternal Grandmother    Glaucoma Maternal Grandmother    Cancer Maternal Grandfather    Heart attack Paternal Grandfather    Hypertension Paternal Grandfather    Prostate cancer Neg Hx    Bladder Cancer Neg Hx    Kidney cancer Neg Hx    Social History   Tobacco Use   Smoking status: Former    Packs/day: 0.25    Years: 2.00    Additional pack years: 0.00    Total pack years: 0.50    Types: Cigarettes    Quit date: 05/26/2005    Years since quitting: 17.3   Smokeless tobacco: Former    Types: Chew  Substance Use Topics   Alcohol use: No   Allergies  Allergen Reactions   Colchicine Other (See Comments)    Palpitations and headache   Lisinopril Cough   Prior to  Admission medications   Medication Sig Start Date End Date Taking? Authorizing Provider  acetaminophen (TYLENOL) 500 MG tablet Take 1,000 mg by mouth every 8 (eight) hours as needed for mild pain or moderate pain. Takes once a day   Yes [provider]  aspirin EC 81 MG tablet Take 81 mg by mouth daily. Swallow whole.   Yes [provider]  atorvastatin (LIPITOR) 40 MG tablet Take 1 tablet (40 mg total) by mouth daily. 06/30/22  Yes Antonieta Iba, MD  Blood Glucose Monitoring Suppl (FREESTYLE LITE) DEVI To check blood sugar once daily 07/20/19  Yes  Joycelyn Man M, PA-C  carvedilol (COREG) 25 MG tablet Take 1 tablet (25 mg total) by mouth 2 (two) times daily with a meal. 11/05/21  Yes Gollan, Tollie Pizza, MD  clobetasol (TEMOVATE) 0.05 % external solution APPLY 1 APPLICATION TOPICALLY TWICE A DAY 08/18/22  Yes Jacky Kindle, FNP  cyclobenzaprine (FLEXERIL) 10 MG tablet Take 10 mg by mouth 3 (three) times daily.   Yes [provider]  diazepam (VALIUM) 2 MG tablet Take 2 mg by mouth every 8 (eight) hours as needed. 07/28/22  Yes [provider]  ezetimibe (ZETIA) 10 MG tablet TAKE 1 TABLET DAILY 07/17/22  Yes Gollan, Tollie Pizza, MD  FREESTYLE LITE test strip USE TO CHECK BLOOD SUGAR ONCE DAILY 01/13/22  Yes Jacky Kindle, FNP  indomethacin (INDOCIN) 50 MG capsule Take 50 mg by mouth 2 (two) times daily with a meal. Taking once daily   Yes [provider]  isosorbide mononitrate (IMDUR) 60 MG 24 hr tablet TAKE 1 TABLET DAILY 01/20/22  Yes Gollan, Tollie Pizza, MD  ketoconazole (NIZORAL) 2 % shampoo APPLY 1 APPLICATION TOPICALLY 2 TIMES WEEKLY AS DIRECTED 08/04/22  Yes Merita Norton T, FNP  Lancets (FREESTYLE) lancets USE TO CHECK BLOOD SUGAR ONCE DAILY 12/23/20  Yes Erasmo Downer, MD  metFORMIN (GLUCOPHAGE-XR) 500 MG 24 hr tablet TAKE 2 TABLETS TWICE A DAY WITH MEALS 03/30/22  Yes Merita Norton T, FNP  Multiple Vitamin (MULTIVITAMIN WITH MINERALS) TABS tablet  Take 1 tablet by mouth daily.   Yes [provider]  nitroGLYCERIN (NITROSTAT) 0.4 MG SL tablet Place 1 tablet (0.4 mg total) under the tongue every 5 (five) minutes as needed for chest pain. 02/10/22  Yes Clarisa Kindred A, FNP  pantoprazole (PROTONIX) 40 MG tablet TAKE 1 TABLET DAILY 08/25/22  Yes Bacigalupo, Marzella Schlein, MD  potassium chloride (KLOR-CON) 10 MEQ tablet TAKE 2 TABLETS TWICE A DAY 07/24/22  Yes Jacky Kindle, FNP  pregabalin (LYRICA) 150 MG capsule TAKE 1 CAPSULE BY MOUTH TWO TIMES A DAY 08/04/21  Yes Jacky Kindle, FNP  sacubitril-valsartan (ENTRESTO) 24-26 MG Take 1 tablet by mouth 2 (two) times daily.   Yes [provider]  Semaglutide,0.25 or 0.5MG /DOS, (OZEMPIC, 0.25 OR 0.5 MG/DOSE,) 2 MG/3ML SOPN Inject 0.5 mg into the skin once a week. 08/13/22  Yes Jacky Kindle, FNP  senna (SENOKOT) 8.6 MG TABS tablet Take 1 tablet by mouth daily.   Yes [provider]  torsemide (DEMADEX) 20 MG tablet Take 40 mg by mouth daily.   Yes [provider]   Review of Systems  Constitutional:  Positive for fatigue (tire easily). Negative for appetite change.  HENT:  Negative for congestion, postnasal drip and sore throat.   Eyes: Negative.   Respiratory:  Positive for shortness of breath (at times). Negative for cough, chest tightness and wheezing.   Cardiovascular:  Positive for leg swelling (worsening over the last week). Negative for chest pain and palpitations.  Gastrointestinal:  Negative for abdominal distention and abdominal pain.  Endocrine: Negative.   Genitourinary: Negative.   Musculoskeletal:  Positive for back pain (has stimulator implanted) and neck pain.  Skin: Negative.   Allergic/Immunologic: Negative.   Neurological:  Positive for dizziness (all the time), weakness and numbness (tingling clavicle/ shoulder area). Negative for light-headedness and headaches.  Hematological:  Negative for adenopathy. Does not bruise/bleed easily.   Psychiatric/Behavioral:  Negative for dysphoric mood and sleep disturbance (wearing CPAP, sleeping on 2 pillows). The patient is not  nervous/anxious.    Vitals:   09/28/22 0835  BP: 130/84  Pulse: 71  Resp: 14  SpO2: 100%  Weight: 226 lb 2 oz (102.6 kg)   Wt Readings from Last 3 Encounters:  09/28/22 226 lb 2 oz (102.6 kg)  07/14/22 233 lb (105.7 kg)  06/17/22 235 lb (106.6 kg)   Lab Results  Component Value Date   CREATININE 1.29 (H) 02/10/2022   CREATININE 1.14 01/09/2022   CREATININE 1.72 (H) 01/06/2022   Physical Exam Vitals and nursing note reviewed.  Constitutional:      Appearance: Normal appearance.  HENT:     Head: Normocephalic and atraumatic.  Cardiovascular:     Rate and Rhythm: Normal rate and regular rhythm.  Pulmonary:     Effort: Pulmonary effort is normal. No respiratory distress.     Breath sounds: No wheezing or rales.  Abdominal:     General: There is no distension.     Palpations: Abdomen is soft.  Musculoskeletal:        General: No tenderness.     Cervical back: Normal range of motion and neck supple.     Right lower leg: Edema (trace pitting) present.     Left lower leg: Edema (trace pitting) present.  Skin:    General: Skin is warm and dry.  Neurological:     General: No focal deficit present.     Mental Status: He is alert and oriented to person, place, and time.  Psychiatric:        Mood and Affect: Mood normal.        Behavior: Behavior normal.        Thought Content: Thought content normal.   Assessment & Plan:  1: Ischemic heart failure with preserved ejection fraction- - NYHA class III - euvolemic today - weighing daily; reminded to call for an overnight weight gain of > 2 pounds or a weekly weight gain of > 5 pounds - weight down 7 pounds from last visit here 6 months ago - Echo 12/11/21: EF of 55-60% - continue carvedilol  BID - continue entresto 24/26mg  BID - continue torsemide  daily w/ extra  PRN/ potassium  BID - will add jardiance  daily - BMP next visit - using NoSalt; does eat out occasionally but no more than usual - knows to keep daily fluid intake to 64 ounces but says that he's drinking way more than that due to his dry mouth  - encouraged to put compression socks on  - BNP 01/06/22 was 73.0  2: HTN- - BP 130/84 - saw PCP Suzie Portela) 01/09/22; returns in 2 days - BMP 02/10/22 reviewed and showed sodium 141, potassium 3.9, creatinine 1.29 and GFR >60  3: DM- - A1c 11/06/21 was 6.0% - glucose at home ranging from 100-150's  4: CAD- - saw cardiology Mariah Milling) 01/24 - LHC 05/02/18:  The left ventricular systolic function is normal. LV end diastolic pressure is mildly elevated. The left ventricular ejection fraction is 55-65% by visual estimate. Prox RCA to Mid RCA lesion is 10% stenosed. Prox RCA lesion is 30% stenosed. Dist RCA lesion is 20% stenosed. SVG graft was visualized by angiography. The graft exhibits mild diffuse disease. Ost Cx to Prox Cx lesion is 95% stenosed. Mid LM to Dist LM lesion is 80% stenosed. LIMA and is normal in caliber. The graft exhibits no disease.  1. Significant underlying left main and RCA disease with patent grafts including LIMA to LAD and SVG to OM 2.  Patent RCA stent with minimal restenosis. 2.  Normal LV systolic function.  Mildly elevated left ventricular end-diastolic pressure at 14 to 15 mmHg.  5: Lymphedema- - saw vascular (Dew) 03/24/22 - wearing compression socks a few times a week with good results; encouraged to wear them daily  6: Tingling of left side of face- - EKG done today which shows NSR - sees PCP in 2 days and will discuss further with her; may need cervical xray   Return in 3 weeks, sooner if needed.

## 2022-09-28 ENCOUNTER — Ambulatory Visit: Payer: Medicare PPO | Attending: Family | Admitting: Family

## 2022-09-28 ENCOUNTER — Other Ambulatory Visit (HOSPITAL_COMMUNITY): Payer: Self-pay

## 2022-09-28 ENCOUNTER — Encounter: Payer: Self-pay | Admitting: Family

## 2022-09-28 VITALS — BP 130/84 | HR 71 | Resp 14 | Wt 226.1 lb

## 2022-09-28 DIAGNOSIS — N400 Enlarged prostate without lower urinary tract symptoms: Secondary | ICD-10-CM | POA: Insufficient documentation

## 2022-09-28 DIAGNOSIS — I25118 Atherosclerotic heart disease of native coronary artery with other forms of angina pectoris: Secondary | ICD-10-CM | POA: Diagnosis not present

## 2022-09-28 DIAGNOSIS — Z87891 Personal history of nicotine dependence: Secondary | ICD-10-CM | POA: Insufficient documentation

## 2022-09-28 DIAGNOSIS — E119 Type 2 diabetes mellitus without complications: Secondary | ICD-10-CM | POA: Insufficient documentation

## 2022-09-28 DIAGNOSIS — I5030 Unspecified diastolic (congestive) heart failure: Secondary | ICD-10-CM | POA: Insufficient documentation

## 2022-09-28 DIAGNOSIS — R682 Dry mouth, unspecified: Secondary | ICD-10-CM | POA: Diagnosis not present

## 2022-09-28 DIAGNOSIS — E1151 Type 2 diabetes mellitus with diabetic peripheral angiopathy without gangrene: Secondary | ICD-10-CM

## 2022-09-28 DIAGNOSIS — Z955 Presence of coronary angioplasty implant and graft: Secondary | ICD-10-CM | POA: Insufficient documentation

## 2022-09-28 DIAGNOSIS — I2510 Atherosclerotic heart disease of native coronary artery without angina pectoris: Secondary | ICD-10-CM | POA: Diagnosis not present

## 2022-09-28 DIAGNOSIS — I890 Lymphedema, not elsewhere classified: Secondary | ICD-10-CM | POA: Diagnosis not present

## 2022-09-28 DIAGNOSIS — I10 Essential (primary) hypertension: Secondary | ICD-10-CM

## 2022-09-28 DIAGNOSIS — K219 Gastro-esophageal reflux disease without esophagitis: Secondary | ICD-10-CM | POA: Insufficient documentation

## 2022-09-28 DIAGNOSIS — I110 Hypertensive heart disease with heart failure: Secondary | ICD-10-CM | POA: Insufficient documentation

## 2022-09-28 DIAGNOSIS — R202 Paresthesia of skin: Secondary | ICD-10-CM | POA: Diagnosis not present

## 2022-09-28 DIAGNOSIS — I5032 Chronic diastolic (congestive) heart failure: Secondary | ICD-10-CM | POA: Diagnosis not present

## 2022-09-28 MED ORDER — EMPAGLIFLOZIN 10 MG PO TABS: 10.0000 mg | ORAL_TABLET | Freq: Every day | ORAL | 5 refills | Status: DC

## 2022-09-28 NOTE — Patient Instructions (Addendum)
Start taking jardiance as 1 tablet every morning.  

## 2022-09-28 NOTE — Progress Notes (Signed)
Medication Samples have been provided to the patient.  Drug name: Jardiance       Strength: 10 mg        Qty: 4  LOT: [id number]  Exp.Date: 04/2024  Dosing instructions: Take one tablet by mouth once daily  The patient has been instructed regarding the correct time, dose, and frequency of taking this medication, including desired effects and most common side effects.   Jonathon Newman 10:17 AM 09/28/2022

## 2022-09-29 DIAGNOSIS — M21372 Foot drop, left foot: Secondary | ICD-10-CM | POA: Diagnosis not present

## 2022-09-29 DIAGNOSIS — R202 Paresthesia of skin: Secondary | ICD-10-CM | POA: Diagnosis not present

## 2022-09-29 DIAGNOSIS — R29818 Other symptoms and signs involving the nervous system: Secondary | ICD-10-CM | POA: Diagnosis not present

## 2022-09-29 DIAGNOSIS — R200 Anesthesia of skin: Secondary | ICD-10-CM | POA: Diagnosis not present

## 2022-09-29 DIAGNOSIS — R42 Dizziness and giddiness: Secondary | ICD-10-CM | POA: Diagnosis not present

## 2022-09-29 NOTE — Progress Notes (Unsigned)
I,J'ya E Tirrell Buchberger,acting as a scribe for Jacky Kindle, FNP.,have documented all relevant documentation on the behalf of Jacky Kindle, FNP,as directed by  Jacky Kindle, FNP while in the presence of Jacky Kindle, FNP.  Annual Wellness Visit  Patient: Jonathon Newman, Male    DOB: 1955-07-22, 67 y.o.   MRN: [id number] Visit Date: 09/30/2022  Today's Provider: Jacky Kindle, FNP  Patient presents for new patient visit to establish care.  Introduced to Publishing rights manager role and practice setting.  All questions answered.  Discussed provider/patient relationship and expectations.  Missed medicare annual initial given 09/13/21 initial; presenting today for AWV.  Chief Complaint  Patient presents with   Annual Exam    AWV   Subjective    Jonathon Newman is a 67 y.o. male who presents today for his Annual Wellness Visit. He reports consuming a general diet. The patient does not participate in regular exercise at present. He generally feels fairly well. He reports sleeping well. He does have additional problems to discuss today.   Patient presents with a back rash that initially appeared a couple of months ago. He complains of numbness in his left hand that started 3 days ago and a tingling down his neck that radiates to his chest and shoulders. Patient reports that both of his ankles are swollen.   Patient reports that his last diabetic eye exam was June 2023.  HPI  Medications: Outpatient Medications Prior to Visit  Medication Sig   acetaminophen (TYLENOL) 500 MG tablet Take 1,000 mg by mouth every 8 (eight) hours as needed for mild pain or moderate pain. Takes once a day   aspirin EC 81 MG tablet Take 81 mg by mouth daily. Swallow whole.   atorvastatin (LIPITOR) 40 MG tablet Take 1 tablet (40 mg total) by mouth daily.   Blood Glucose Monitoring Suppl (FREESTYLE LITE) DEVI To check blood sugar once daily   carvedilol (COREG) 25 MG tablet Take 1 tablet (25 mg total) by mouth 2 (two) times  daily with a meal.   clobetasol (TEMOVATE) 0.05 % external solution APPLY 1 APPLICATION TOPICALLY TWICE A DAY   cyclobenzaprine (FLEXERIL) 10 MG tablet Take 10 mg by mouth 3 (three) times daily.   diazepam (VALIUM) 2 MG tablet Take 2 mg by mouth every 8 (eight) hours as needed.   empagliflozin (JARDIANCE) 10 MG TABS tablet Take 1 tablet (10 mg total) by mouth daily before breakfast.   ezetimibe (ZETIA) 10 MG tablet TAKE 1 TABLET DAILY   FREESTYLE LITE test strip USE TO CHECK BLOOD SUGAR ONCE DAILY   indomethacin (INDOCIN) 50 MG capsule Take 50 mg by mouth 2 (two) times daily with a meal. Taking once daily   isosorbide mononitrate (IMDUR) 60 MG 24 hr tablet TAKE 1 TABLET DAILY   ketoconazole (NIZORAL) 2 % shampoo APPLY 1 APPLICATION TOPICALLY 2 TIMES WEEKLY AS DIRECTED   Lancets (FREESTYLE) lancets USE TO CHECK BLOOD SUGAR ONCE DAILY   metFORMIN (GLUCOPHAGE-XR) 500 MG 24 hr tablet TAKE 2 TABLETS TWICE A DAY WITH MEALS   Multiple Vitamin (MULTIVITAMIN WITH MINERALS) TABS tablet Take 1 tablet by mouth daily.   nitroGLYCERIN (NITROSTAT) 0.4 MG SL tablet Place 1 tablet (0.4 mg total) under the tongue every 5 (five) minutes as needed for chest pain.   pantoprazole (PROTONIX) 40 MG tablet TAKE 1 TABLET DAILY   potassium chloride (KLOR-CON) 10 MEQ tablet TAKE 2 TABLETS TWICE A DAY   pregabalin (LYRICA) 150 MG capsule TAKE  1 CAPSULE BY MOUTH TWO TIMES A DAY   sacubitril-valsartan (ENTRESTO) 24-26 MG Take 1 tablet by mouth 2 (two) times daily.   Semaglutide,0.25 or 0.5MG /DOS, (OZEMPIC, 0.25 OR 0.5 MG/DOSE,) 2 MG/3ML SOPN Inject 0.5 mg into the skin once a week.   senna (SENOKOT) 8.6 MG TABS tablet Take 1 tablet by mouth daily.   torsemide (DEMADEX) 20 MG tablet Take 40 mg by mouth daily.   No facility-administered medications prior to visit.    Allergies  Allergen Reactions   Colchicine Other (See Comments)    Palpitations and headache   Lisinopril Cough    Patient Care Team: Jacky Kindle,  FNP as PCP - General (Family Medicine) Mariah Milling Tollie Pizza, MD as PCP - Cardiology (Cardiology) Gaspar Cola, [facility] (Pharmacist)  Review of Systems  HENT:  Positive for trouble swallowing.   Respiratory:  Positive for apnea.   Cardiovascular:  Positive for leg swelling.  Endocrine: Positive for polyphagia.  Musculoskeletal:  Positive for arthralgias, back pain, gait problem and neck pain.  Skin:  Positive for rash.  Neurological:  Positive for dizziness, light-headedness and numbness.    Objective    Vitals: BP 110/71 (BP Location: Right Arm, Patient Position: Sitting, Cuff Size: Large)   Pulse 80   Temp 97.9 F (36.6 C) (Oral)   Resp 12   Ht  (1.702 m)   Wt 226 lb (102.5 kg)   SpO2 96%   BMI 35.40 kg/m   Physical Exam Vitals and nursing note reviewed.  Constitutional:      General: He is awake. He is not in acute distress.    Appearance: Normal appearance. He is well-developed and well-groomed. He is not ill-appearing, toxic-appearing or diaphoretic.  HENT:     Head: Normocephalic and atraumatic.     Jaw: There is normal jaw occlusion. No trismus, tenderness, swelling or pain on movement.     Salivary Glands: Right salivary gland is not diffusely enlarged or tender. Left salivary gland is not diffusely enlarged or tender.     Right Ear: Hearing, tympanic membrane, ear canal and external ear normal. There is no impacted cerumen.     Left Ear: Hearing, tympanic membrane, ear canal and external ear normal. There is no impacted cerumen.     Nose: Nose normal. No congestion or rhinorrhea.     Right Turbinates: Not enlarged, swollen or pale.     Left Turbinates: Not enlarged, swollen or pale.     Right Sinus: No maxillary sinus tenderness or frontal sinus tenderness.     Left Sinus: No maxillary sinus tenderness or frontal sinus tenderness.     Mouth/Throat:     Lips: Pink.     Mouth: Mucous membranes are moist. No injury, lacerations, oral lesions or angioedema.      Pharynx: Oropharynx is clear. Uvula midline. No pharyngeal swelling, oropharyngeal exudate or posterior oropharyngeal erythema.     Tonsils: No tonsillar exudate or tonsillar abscesses.  Eyes:     General: Lids are normal. Vision grossly intact. Gaze aligned appropriately.        Right eye: No discharge.        Left eye: No discharge.     Extraocular Movements: Extraocular movements intact.     Conjunctiva/sclera: Conjunctivae normal.     Pupils: Pupils are equal, round, and reactive to light.  Neck:     Thyroid: No thyroid mass, thyromegaly or thyroid tenderness.     Vascular: No carotid bruit.  Trachea: Trachea normal. No tracheal tenderness.  Cardiovascular:     Rate and Rhythm: Normal rate and regular rhythm.     Pulses: Normal pulses.          Carotid pulses are 2+ on the right side and 2+ on the left side.      Radial pulses are 2+ on the right side and 2+ on the left side.       Femoral pulses are 2+ on the right side and 2+ on the left side.      Popliteal pulses are 2+ on the right side and 2+ on the left side.       Dorsalis pedis pulses are 2+ on the right side and 2+ on the left side.       Posterior tibial pulses are 2+ on the right side and 2+ on the left side.     Heart sounds: Normal heart sounds, S1 normal and S2 normal. No murmur heard.    No friction rub. No gallop.  Pulmonary:     Effort: Pulmonary effort is normal. No respiratory distress.     Breath sounds: Normal breath sounds and air entry. No stridor. No wheezing, rhonchi or rales.  Chest:     Chest wall: No tenderness.  Abdominal:     General: Abdomen is flat. Bowel sounds are normal. There is no distension.     Palpations: Abdomen is soft. There is no mass.     Tenderness: There is no abdominal tenderness. There is no guarding or rebound.     Hernia: No hernia is present.  Genitourinary:    Comments: Exam deferred; denies complaints Musculoskeletal:        General: No swelling, tenderness, deformity  or signs of injury. Normal range of motion.     Cervical back: Normal range of motion and neck supple. No rigidity or tenderness.     Right lower leg: No edema.     Left lower leg: No edema.  Lymphadenopathy:     Cervical: No cervical adenopathy.     Right cervical: No superficial, deep or posterior cervical adenopathy.    Left cervical: No superficial, deep or posterior cervical adenopathy.  Skin:    General: Skin is warm and dry.     Capillary Refill: Capillary refill takes less than 2 seconds.     Coloration: Skin is not jaundiced or pale.     Findings: Erythema and rash present. No bruising or lesion.       Neurological:     General: No focal deficit present.     Mental Status: He is alert and oriented to person, place, and time. Mental status is at baseline.     GCS: GCS eye subscore is 4. GCS verbal subscore is 5. GCS motor subscore is 6.     Sensory: Sensation is intact. No sensory deficit.     Motor: Motor function is intact. No weakness.     Coordination: Coordination is intact.     Gait: Gait is intact.  Psychiatric:        Attention and Perception: Attention and perception normal.        Mood and Affect: Mood and affect normal.        Speech: Speech normal.        Behavior: Behavior normal. Behavior is cooperative.        Thought Content: Thought content normal.        Cognition and Memory: Cognition normal.  Judgment: Judgment normal.     Most recent functional status assessment:    09/30/2022    9:35 AM  In your present state of health, do you have any difficulty performing the following activities:  Hearing? 0  Vision? 0  Difficulty concentrating or making decisions? 0  Walking or climbing stairs? 1  Dressing or bathing? 0  Doing errands, shopping? 0   Most recent fall risk assessment:    09/30/2022    9:35 AM  Fall Risk   Number falls in past yr: 1  Injury with Fall? 1  Risk for fall due to : History of fall(s);Impaired balance/gait    Most  recent depression screenings:    09/30/2022    9:35 AM 02/10/2022    9:04 AM  PHQ 2/9 Scores  PHQ - 2 Score 0 0  PHQ- 9 Score 2    Most recent cognitive screening:    09/30/2022    9:31 AM  6CIT Screen  What Year? 0 points  What month? 0 points  What time? 0 points  Count back from 20 0 points  Months in reverse 0 points  Repeat phrase 0 points  Total Score 0 points   Most recent Audit-C alcohol use screening    09/30/2022    9:36 AM  Alcohol Use Disorder Test (AUDIT)  1. How often do you have a drink containing alcohol? 0   A score of 3 or more in women, and 4 or more in men indicates increased risk for alcohol abuse, EXCEPT if all of the points are from question 1   No results found for any visits on 09/30/22.  Assessment & Plan     Annual wellness visit done today including the all of the following: Reviewed patient's Family Medical History Reviewed and updated list of patient's medical providers Assessment of cognitive impairment was done Assessed patient's functional ability Established a written schedule for health screening services Health Risk Assessent Completed and Reviewed  Exercise Activities and Dietary recommendations  Goals      Monitor and Manage My Blood Sugar-Diabetes Type 2     Timeframe:  Long-Range Goal Priority:  High Start Date:  07/29/2020                           Expected End Date:  01/26/2021                     Follow Up Date 12/03/2020    - check blood sugar at prescribed times - check blood sugar if I feel it is too high or too low    Why is this important?   Checking your blood sugar at home helps to keep it from getting very high or very low.  Writing the results in a diary or log helps the doctor know how to care for you.  Your blood sugar log should have the time, date and the results.  Also, write down the amount of insulin or other medicine that you take.  Other information, like what you ate, exercise done and how you were  feeling, will also be helpful.     Notes:         Immunization History  Administered Date(s) Administered   Hepatitis A, Adult 09/23/1995   Influenza, Seasonal, Injecte, Preservative Fre 06/14/2015   Moderna Sars-Covid-2 Vaccination 11/28/2019, 12/28/2019   Pneumococcal Polysaccharide-23 08/03/2012   Tdap 08/03/2012   Zoster Recombinat (Shingrix) 12/22/2018  Health Maintenance  Topic Date Due   OPHTHALMOLOGY EXAM  Never done   Zoster Vaccines- Shingrix (2 of 2) 02/16/2019   COLONOSCOPY (Pts 45-67yrs Insurance coverage will need to be confirmed)  05/26/2020   Diabetic kidney evaluation - Urine ACR  07/16/2020   Pneumonia Vaccine 35+ Years old (2 of 2 - PCV) 09/20/2021   FOOT EXAM  12/27/2021   COVID-19 Vaccine (3 - 2023-24 season) 02/13/2022   HEMOGLOBIN A1C  05/09/2022   DTaP/Tdap/Td (2 - Td or Tdap) 08/03/2022   INFLUENZA VACCINE  01/14/2023   Diabetic kidney evaluation - eGFR measurement  02/11/2023   Medicare Annual Wellness (AWV)  09/30/2023   Hepatitis C Screening  Completed   HPV VACCINES  Aged Out     Discussed health benefits of physical activity, and encouraged him to engage in regular exercise appropriate for his age and condition.    Problem List Items Addressed This Visit       Other   23-polyvalent pneumococcal polysaccharide vaccine declined   Medicare annual wellness visit, initial - Primary    Missed WTM visit; EKG already completed with cardiology Will refer for colon cancer screening Due for vision for DM screening Followed by multiple specialists; working to establish plan for numbness and tingling ongoing on L arm/L shoulder up to ear/face      Screening for colon cancer    Has upcoming appt with GI with Dr Tobi Bastos      Relevant Orders   Ambulatory referral to Gastroenterology   Return in about 3 months (around 12/30/2022) for chonic disease management.    Leilani Merl, FNP, have reviewed all documentation for this visit. The  documentation on 09/30/22 for the exam, diagnosis, procedures, and orders are all accurate and complete.  Jacky Kindle, FNP  [facility] [phone] (phone) [phone] (fax)  [facility]

## 2022-09-30 ENCOUNTER — Ambulatory Visit (INDEPENDENT_AMBULATORY_CARE_PROVIDER_SITE_OTHER): Payer: Medicare PPO | Admitting: Family Medicine

## 2022-09-30 ENCOUNTER — Other Ambulatory Visit: Payer: Self-pay | Admitting: Family Medicine

## 2022-09-30 ENCOUNTER — Other Ambulatory Visit: Payer: Self-pay

## 2022-09-30 ENCOUNTER — Encounter: Payer: Self-pay | Admitting: Family Medicine

## 2022-09-30 VITALS — BP 110/71 | HR 80 | Temp 97.9°F | Resp 12 | Ht 67.0 in | Wt 226.0 lb

## 2022-09-30 DIAGNOSIS — Z0000 Encounter for general adult medical examination without abnormal findings: Secondary | ICD-10-CM | POA: Diagnosis not present

## 2022-09-30 DIAGNOSIS — E785 Hyperlipidemia, unspecified: Secondary | ICD-10-CM | POA: Diagnosis not present

## 2022-09-30 DIAGNOSIS — E1159 Type 2 diabetes mellitus with other circulatory complications: Secondary | ICD-10-CM | POA: Diagnosis not present

## 2022-09-30 DIAGNOSIS — R200 Anesthesia of skin: Secondary | ICD-10-CM | POA: Insufficient documentation

## 2022-09-30 DIAGNOSIS — E1169 Type 2 diabetes mellitus with other specified complication: Secondary | ICD-10-CM | POA: Diagnosis not present

## 2022-09-30 DIAGNOSIS — E559 Vitamin D deficiency, unspecified: Secondary | ICD-10-CM | POA: Diagnosis not present

## 2022-09-30 DIAGNOSIS — E1151 Type 2 diabetes mellitus with diabetic peripheral angiopathy without gangrene: Secondary | ICD-10-CM

## 2022-09-30 DIAGNOSIS — I152 Hypertension secondary to endocrine disorders: Secondary | ICD-10-CM | POA: Insufficient documentation

## 2022-09-30 DIAGNOSIS — R202 Paresthesia of skin: Secondary | ICD-10-CM | POA: Diagnosis not present

## 2022-09-30 DIAGNOSIS — Z1211 Encounter for screening for malignant neoplasm of colon: Secondary | ICD-10-CM

## 2022-09-30 DIAGNOSIS — G8929 Other chronic pain: Secondary | ICD-10-CM

## 2022-09-30 DIAGNOSIS — Z2821 Immunization not carried out because of patient refusal: Secondary | ICD-10-CM

## 2022-09-30 MED ORDER — CLOBETASOL PROPIONATE 0.05 % EX OINT: 1.0000 | TOPICAL_OINTMENT | Freq: Two times a day (BID) | CUTANEOUS | 5 refills | Status: DC | PRN

## 2022-09-30 NOTE — Assessment & Plan Note (Signed)
Has upcoming appt with GI with Dr Tobi Bastos

## 2022-09-30 NOTE — Assessment & Plan Note (Signed)
Due for colon cancer screening Eye exam due in June Due for vaccines; declines today Things to do to keep yourself healthy  - Exercise at least 30-45 minutes a day, 3-4 days a week.  - Eat a low-fat diet with lots of fruits and vegetables, up to 7-9 servings per day.  - Seatbelts can save your life. Wear them always.  - Smoke detectors on every level of your home, check batteries every year.  - Eye Doctor - have an eye exam every 1-2 years  - Safe sex - if you may be exposed to STDs, use a condom.  - Alcohol -  If you drink, do it moderately, less than 2 drinks per day.  - Health Care Power of Attorney. Choose someone to speak for you if you are not able.  - Depression is common in our stressful world.If you're feeling down or losing interest in things you normally enjoy, please come in for a visit.  - Violence - If anyone is threatening or hurting you, please call immediately.

## 2022-09-30 NOTE — Assessment & Plan Note (Signed)
Missed WTM visit; EKG already completed with cardiology Will refer for colon cancer screening Due for vision for DM screening Followed by multiple specialists; working to establish plan for numbness and tingling ongoing on L arm/L shoulder up to ear/face

## 2022-09-30 NOTE — Addendum Note (Signed)
Addended by: Merita Norton T on: 09/30/2022 10:12 AM   Modules accepted: Level of Service

## 2022-10-01 ENCOUNTER — Other Ambulatory Visit: Payer: Self-pay | Admitting: Family Medicine

## 2022-10-01 DIAGNOSIS — E1151 Type 2 diabetes mellitus with diabetic peripheral angiopathy without gangrene: Secondary | ICD-10-CM | POA: Diagnosis not present

## 2022-10-01 DIAGNOSIS — N179 Acute kidney failure, unspecified: Secondary | ICD-10-CM

## 2022-10-01 LAB — LIPID PANEL
Cholesterol, Total: 130 mg/dL (ref 100–199)
VLDL Cholesterol Cal: 35 mg/dL (ref 5–40)

## 2022-10-01 LAB — VITAMIN B6

## 2022-10-01 NOTE — Progress Notes (Signed)
Patient was in office and advised we are doing an [place] of his kidneys and a referral to kidney specialist. Also advised I have forwarded his labs to his heart team and neuro team.  -unclear why elevation has returned in creatinine and decrease in eGFR--> perform [place] of kidneys and plan to see kidney specialist - A1c remains well controlled -cholesterol remains well controlled; LDL goal 50-70 -PTH is elevated; can address with nephro at that specialist appt -anemia is improved -thyroid normal -Vit D stable -B12/folate stable -b1/b6 pending

## 2022-10-02 ENCOUNTER — Ambulatory Visit (INDEPENDENT_AMBULATORY_CARE_PROVIDER_SITE_OTHER): Payer: Medicare PPO | Admitting: Podiatry

## 2022-10-02 ENCOUNTER — Other Ambulatory Visit: Payer: Self-pay | Admitting: Cardiovascular Disease

## 2022-10-02 ENCOUNTER — Other Ambulatory Visit: Payer: Self-pay | Admitting: Family Medicine

## 2022-10-02 DIAGNOSIS — M2041 Other hammer toe(s) (acquired), right foot: Secondary | ICD-10-CM

## 2022-10-02 DIAGNOSIS — E1100 Type 2 diabetes mellitus with hyperosmolarity without nonketotic hyperglycemic-hyperosmolar coma (NKHHC): Secondary | ICD-10-CM

## 2022-10-02 DIAGNOSIS — E1151 Type 2 diabetes mellitus with diabetic peripheral angiopathy without gangrene: Secondary | ICD-10-CM

## 2022-10-02 DIAGNOSIS — M2042 Other hammer toe(s) (acquired), left foot: Secondary | ICD-10-CM

## 2022-10-02 DIAGNOSIS — E119 Type 2 diabetes mellitus without complications: Secondary | ICD-10-CM

## 2022-10-02 LAB — MICROALBUMIN / CREATININE URINE RATIO
Creatinine, Urine: 56.9 mg/dL
Microalb/Creat Ratio: 10 mg/g creat (ref 0–29)
Microalbumin, Urine: 5.9 ug/mL

## 2022-10-02 NOTE — Progress Notes (Unsigned)
Subjective: Jonathon Newman presents today referred by Jacky Kindle, FNP for diabetic foot evaluation.  Patient relates many year history of diabetes.  Patient denies any history of foot wounds.  Patient denies any history of numbness, tingling, burning, pins/needles sensations.  Past Medical History:  Diagnosis Date   Anginal pain    Arthritis    BPH (benign prostatic hyperplasia)    CHF (congestive heart failure)    Coronary artery disease 2010   a.) LHC 2010: high grade RCA stenosis -> 2.5 x 26mm Cypher DES to RCA. b.) NSTEMI 2016 -> PCI revealed pat RCA stent; sig dLM Dz with FFR ratio 0.76 and oLCx stenosis; ref to CVTS. c.) 2v CABG 04/21/2015; LIMA-LAD, SVG-OM2. d.) [facility] 05/02/2018: EF 55-65%; LVEDP 14-15 mmHg; 10% p-m RCA, 30% pRCA, 20% dRCA, 95% o-pLCx, 80% m-dLM; LIMA graft pat. SVG graft with mild diffuse Dz.   DDD (degenerative disc disease), lumbar    Dyspnea    GERD (gastroesophageal reflux disease)    Hypercholesteremia    Hypertension    Left foot drop    NSTEMI (non-ST elevated myocardial infarction) 2016   a.) PCI revealed patent RCA stent; significant dLM disease with FFR ratio 0.76 and oLCx stenosis; refer to CVTS for CABG.   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   a.) 2v CABG; LIMA-LAD, SVG-OM2   T2DM (type 2 diabetes mellitus)     Patient Active Problem List   Diagnosis Date Noted   Benign prostatic hyperplasia with urinary obstruction 10/05/2022   Screening for colon cancer 09/30/2022   23-polyvalent pneumococcal polysaccharide vaccine declined 09/30/2022   Avitaminosis D 09/30/2022   Numbness and tingling in left hand 09/30/2022   Hypertension associated with diabetes 09/30/2022   Medicare annual wellness visit, initial 09/30/2022   CAD (coronary artery disease) 06/01/2022   Vitamin D deficiency 06/01/2022   Snoring 06/01/2022   Other specified counseling 06/01/2022   Lateral epicondylitis (tennis elbow) 06/01/2022    History of heartburn 06/01/2022   Contact dermatitis 06/01/2022   Chronic pharyngitis 06/01/2022   Abdominal tenderness, right upper quadrant 06/01/2022   Pain in joint, forearm 06/01/2022   Rash and other nonspecific skin eruption 06/01/2022   Pain in joint of left knee 04/20/2022   Swelling of limb 01/23/2022   3+ pitting edema 01/09/2022   AKI (acute kidney injury) 01/09/2022   Left hip pain 12/19/2021   Chronic diastolic CHF (congestive heart failure) 12/19/2021   Localized edema 12/19/2021   Morton's neuroma of both feet 07/01/2021   DDD (degenerative disc disease), lumbosacral 07/01/2021   Psoriasis of scalp 07/01/2021   Essential hypertension 03/31/2021   Type 2 diabetes mellitus with diabetic peripheral angiopathy without gangrene, without long-term current use of insulin 03/31/2021   History of recent fall 03/31/2021   Foot drop, left 03/31/2021   BMI 33.0-33.9,adult 03/31/2021   Hyponatremia 02/14/2021   Hyperlipidemia associated with type 2 diabetes mellitus 12/27/2020   Elevated transaminase level 10/04/2020   Morbid obesity 05/20/2020   T2DM (type 2 diabetes mellitus) 07/17/2019   Strain of foot 05/09/2019   Benign paroxysmal positional vertigo due to bilateral vestibular disorder 03/15/2019   Abnormal findings on diagnostic imaging of lung 03/15/2019   Chest pain 03/10/2019   Pain in right knee 09/10/2018   Unstable angina 04/25/2018   SOB (shortness of breath) 04/13/2018   Vertigo 04/13/2018   S/P coronary artery bypass graft x 2    Incomplete bladder emptying  07/21/2017   Low back pain 07/09/2017   Lumbar radiculopathy 07/09/2017   Gastroesophageal reflux disease with esophagitis 07/05/2017   Congestive heart failure 07/05/2017   Benign prostatic hyperplasia with weak urinary stream 07/05/2017   Dysphagia 07/05/2017   DDD (degenerative disc disease), lumbar 07/05/2017   OSA (obstructive sleep apnea) 07/31/2015    Past Surgical History:  Procedure  Laterality Date   APPENDECTOMY     BACK SURGERY  11/01/2017   SL 5 and S1   CARDIAC CATHETERIZATION     CARPAL TUNNEL RELEASE     left hand   CHOLECYSTECTOMY     CHOLECYSTECTOMY     CORONARY ARTERY BYPASS GRAFT  04/21/2015   CABG x 2 [facility] LIMA to LAD amd SVG to OM2   CORONARY STENT PLACEMENT  2010   Cordis Cypher Sirolimus-eluting stent 2.50 mm x 26 mm placed to the RCA at [facility]    ESOPHAGEAL MANOMETRY N/A 08/04/2017   Procedure: ESOPHAGEAL MANOMETRY (EM);  Surgeon: Toney Reil, MD;  Location: [facility] ENDOSCOPY;  Service: Endoscopy;  Laterality: N/A;   ESOPHAGOGASTRODUODENOSCOPY (EGD) WITH PROPOFOL N/A 06/17/2022   Procedure: ESOPHAGOGASTRODUODENOSCOPY (EGD) WITH PROPOFOL;  Surgeon: [name], MD;  Location: [facility] ENDOSCOPY;  Service: Gastroenterology;  Laterality: N/A;   FRACTURE SURGERY     KNEE SURGERY     KNEE SURGERY     right knee    LAMINECTOMY     "plate in neck W0-J8"   LEFT HEART CATH AND CORS/GRAFTS ANGIOGRAPHY N/A 05/02/2018   Procedure: CORS/GRAFTS ANGIOGRAPHY;  Surgeon: Iran Ouch, MD;  Location: [facility] INVASIVE CV LAB;  Service: Cardiovascular;  Laterality: N/A;   RIGHT/LEFT HEART CATH AND CORONARY ANGIOGRAPHY Bilateral 05/02/2018   Procedure: LEFT HEART CATH;  Surgeon: Iran Ouch, MD;  Location: [facility] INVASIVE CV LAB;  Service: Cardiovascular;  Laterality: Bilateral;   THORACIC LAMINECTOMY FOR SPINAL CORD STIMULATOR N/A 05/26/2021   Procedure: THORACIC SPINAL CORD STIMULATOR AND PULSE GENERATOR PLACEMENT (MEDTRONIC);  Surgeon: Lucy Chris, MD;  Location: [facility] ORS;  Service: Neurosurgery;  Laterality: N/A;   VASECTOMY      Current Outpatient Medications on File Prior to Visit  Medication Sig Dispense Refill   acetaminophen (TYLENOL) 500 MG tablet Take 1,000 mg by mouth every 8 (eight) hours as needed for mild pain or moderate pain. Takes once a day     aspirin EC 81 MG tablet Take 81 mg by mouth daily. Swallow whole.      atorvastatin (LIPITOR) 40 MG tablet Take 1 tablet (40 mg total) by mouth daily. 90 tablet 3   Blood Glucose Monitoring Suppl (FREESTYLE LITE) DEVI To check blood sugar once daily 1 each 0   carvedilol (COREG) 25 MG tablet Take 1 tablet (25 mg total) by mouth 2 (two) times daily with a meal. 180 tablet 3   clobetasol (TEMOVATE) 0.05 % external solution APPLY 1 APPLICATION TOPICALLY TWICE A DAY 100 mL 6   clobetasol ointment (TEMOVATE) 0.05 % Apply 1 Application topically 2 (two) times daily as needed. 60 g 5   cyclobenzaprine (FLEXERIL) 10 MG tablet Take 10 mg by mouth 3 (three) times daily.     diazepam (VALIUM) 2 MG tablet Take 2 mg by mouth every 8 (eight) hours as needed.     empagliflozin (JARDIANCE) 10 MG TABS tablet Take 1 tablet (10 mg total) by mouth daily before breakfast. 30 tablet 5   FREESTYLE LITE test strip USE TO CHECK BLOOD SUGAR ONCE DAILY 100 strip  3   indomethacin (INDOCIN) 50 MG capsule Take 50 mg by mouth 2 (two) times daily with a meal. Taking once daily     isosorbide mononitrate (IMDUR) 60 MG 24 hr tablet TAKE 1 TABLET DAILY 90 tablet 3   ketoconazole (NIZORAL) 2 % shampoo APPLY 1 APPLICATION TOPICALLY 2 TIMES WEEKLY AS DIRECTED 120 mL 3   Lancets (FREESTYLE) lancets USE TO CHECK BLOOD SUGAR ONCE DAILY 100 each 4   metFORMIN (GLUCOPHAGE-XR) 500 MG 24 hr tablet TAKE 2 TABLETS TWICE A DAY WITH MEALS 120 tablet 1   Multiple Vitamin (MULTIVITAMIN WITH MINERALS) TABS tablet Take 1 tablet by mouth daily.     nitroGLYCERIN (NITROSTAT) 0.4 MG SL tablet Place 1 tablet (0.4 mg total) under the tongue every 5 (five) minutes as needed for chest pain. 25 tablet 3   pantoprazole (PROTONIX) 40 MG tablet TAKE 1 TABLET DAILY 90 tablet 3   potassium chloride (KLOR-CON) 10 MEQ tablet TAKE 2 TABLETS TWICE A DAY 360 tablet 1   pregabalin (LYRICA) 150 MG capsule Take 150 mg by mouth 2 (two) times daily.     sacubitril-valsartan (ENTRESTO) 24-26 MG Take 1 tablet by mouth 2 (two) times daily.      Semaglutide,0.25 or 0.5MG /DOS, (OZEMPIC, 0.25 OR 0.5 MG/DOSE,) 2 MG/3ML SOPN Inject 0.5 mg into the skin once a week. 9 mL 0   senna (SENOKOT) 8.6 MG TABS tablet Take 1 tablet by mouth daily.     No current facility-administered medications on file prior to visit.     Allergies  Allergen Reactions   Colchicine Other (See Comments)    Palpitations and headache   Lisinopril Cough    Social History   Occupational History   Not on file  Tobacco Use   Smoking status: Former    Packs/day: 0.25    Years: 2.00    Additional pack years: 0.00    Total pack years: 0.50    Types: Cigarettes    Quit date: 05/26/2005    Years since quitting: 17.3   Smokeless tobacco: Former    Types: Associate Professor Use: Never used  Substance and Sexual Activity   Alcohol use: No   Drug use: No   Sexual activity: Not on file    Family History  Problem Relation Age of Onset   Arthritis Mother    Hyperlipidemia Father    Hypertension Father    Heart attack Father 12   Heart murmur Brother    Valvular heart disease Brother    Cataracts Maternal Grandmother    Glaucoma Maternal Grandmother    Cancer Maternal Grandfather    Heart attack Paternal Grandfather    Hypertension Paternal Grandfather    Prostate cancer Neg Hx    Bladder Cancer Neg Hx    Kidney cancer Neg Hx     Immunization History  Administered Date(s) Administered   Hepatitis A, Adult 09/23/1995   Influenza, Seasonal, Injecte, Preservative Fre 06/14/2015   Moderna Sars-Covid-2 Vaccination 11/28/2019, 12/28/2019   Pneumococcal Polysaccharide-23 08/03/2012   Tdap 08/03/2012   Zoster Recombinat (Shingrix) 12/22/2018    Review of systems: Positive Findings in bold print.  Constitutional:  chills, fatigue, fever, sweats, weight change Communication: Nurse, learning disability, sign Presenter, broadcasting, hand writing, iPad/Android device Head: headaches, head injury Eyes: changes in vision, eye pain, glaucoma, cataracts, macular  degeneration, diplopia, glare,  light sensitivity, eyeglasses or contacts, blindness Ears nose mouth throat: hearing impaired, hearing aids,  ringing in ears, deaf, sign language,  vertigo, nosebleeds,  rhinitis,  cold sores, snoring, swollen glands Cardiovascular: HTN, edema, arrhythmia, pacemaker in place, defibrillator in place, chest pain/tightness, chronic anticoagulation, blood clot, heart failure, MI Peripheral Vascular: leg cramps, varicose veins, blood clots, lymphedema, varicosities Respiratory:  asthma, difficulty breathing, denies congestion, SOB, wheezing, cough, emphysema Gastrointestinal: change in appetite or weight, abdominal pain, constipation, diarrhea, nausea, vomiting, vomiting blood, change in bowel habits, abdominal pain, jaundice, rectal bleeding, hemorrhoids, GERD Genitourinary:  nocturia,  pain on urination, polyuria,  blood in urine, Foley catheter, urinary urgency, ESRD on hemodialysis Musculoskeletal: amputation, cramping, stiff joints, painful joints, decreased joint motion, fractures, OA, gout, hemiplegia, paraplegia, uses cane, wheelchair bound, uses walker, uses rollator Skin: +changes in toenails, color change, dryness, itching, mole changes,  rash, wound(s) Neurological: headaches, numbness in feet, paresthesias in feet, burning in feet, fainting,  seizures, change in speech, migraines, memory problems/poor historian, cerebral palsy, weakness, paralysis, CVA, TIA Endocrine: diabetes, hypothyroidism, hyperthyroidism,  goiter, dry mouth, flushing, heat intolerance, cold intolerance,  excessive thirst, denies polyuria,  nocturia Hematological:  easy bleeding, excessive bleeding, easy bruising, enlarged lymph nodes, on long term blood thinner, history of past transusions Allergy/immunological:  hives, eczema, frequent infections, multiple drug allergies, seasonal allergies, transplant recipient, multiple food allergies Psychiatric:  anxiety, depression, mood disorder,  suicidal ideations, hallucinations, insomnia  Objective: There were no vitals filed for this visit. Vascular Examination: Capillary refill time less than 3 seconds x 10 digits.  Dorsalis pedis pulses palpable 2 out of 4.  Posterior tibial pulses palpable 2 out of 4.  Digital hair not present x 10 digits.  Skin temperature gradient WNL b/l.  Dermatological Examination: Skin with normal turgor, texture and tone b/l  Toenails 1-5 b/l discolored, thick, dystrophic with subungual debris and pain with palpation to nailbeds due to thickness of nails.  Musculoskeletal: Muscle strength 5/5 to all LE muscle groups.  Neurological: Sensation intact with 10 gram monofilament.  Vibratory sensation intact.  Assessment: NIDDM Encounter for diabetic foot examination Hammertoe contractures 2 through 4  Plan: Discussed diabetic foot care principles. Literature dispensed on today. Patient to continue soft, supportive shoe gear daily. Patient to report any pedal injuries to medical professional immediately. Follow up one year. Patient/POA to call should there be a concern in the interim. Given the patient of hammertoe contracture in setting of diabetes patient will benefit from diabetic shoes he will be scheduled to see orthotics department   Normal exam ht 2-4 dm shoes 6.4

## 2022-10-04 LAB — COMPREHENSIVE METABOLIC PANEL
ALT: 34 IU/L (ref 0–44)
AST: 25 IU/L (ref 0–40)
Albumin/Globulin Ratio: 1.6 (ref 1.2–2.2)
Albumin: 4.5 g/dL (ref 3.9–4.9)
Alkaline Phosphatase: 89 IU/L (ref 44–121)
BUN/Creatinine Ratio: 15 (ref 10–24)
BUN: 30 mg/dL — ABNORMAL HIGH (ref 8–27)
Bilirubin Total: 0.6 mg/dL (ref 0.0–1.2)
CO2: 20 mmol/L (ref 20–29)
Calcium: 9.5 mg/dL (ref 8.6–10.2)
Chloride: 102 mmol/L (ref 96–106)
Creatinine, Ser: 1.97 mg/dL — ABNORMAL HIGH (ref 0.76–1.27)
Globulin, Total: 2.9 g/dL (ref 1.5–4.5)
Glucose: 126 mg/dL — ABNORMAL HIGH (ref 70–99)
Potassium: 4.2 mmol/L (ref 3.5–5.2)
Sodium: 139 mmol/L (ref 134–144)
Total Protein: 7.4 g/dL (ref 6.0–8.5)
eGFR: 37 mL/min/{1.73_m2} — ABNORMAL LOW (ref >59)

## 2022-10-04 LAB — CBC WITH DIFFERENTIAL/PLATELET
Basophils Absolute: 0.1 10*3/uL (ref 0.0–0.2)
Basos: 2 %
EOS (ABSOLUTE): 0.2 10*3/uL (ref 0.0–0.4)
Eos: 5 %
Hematocrit: 41.1 % (ref 37.5–51.0)
Hemoglobin: 14.3 g/dL (ref 13.0–17.7)
Immature Grans (Abs): 0 10*3/uL (ref 0.0–0.1)
Immature Granulocytes: 0 %
Lymphocytes Absolute: 1.5 10*3/uL (ref 0.7–3.1)
Lymphs: 27 %
MCH: 31.4 pg (ref 26.6–33.0)
MCHC: 34.8 g/dL (ref 31.5–35.7)
MCV: 90 fL (ref 79–97)
Monocytes Absolute: 0.5 10*3/uL (ref 0.1–0.9)
Monocytes: 10 %
Neutrophils Absolute: 3 10*3/uL (ref 1.4–7.0)
Neutrophils: 56 %
Platelets: 156 10*3/uL (ref 150–450)
RBC: 4.56 x10E6/uL (ref 4.14–5.80)
RDW: 12.6 % (ref 11.6–15.4)
WBC: 5.4 10*3/uL (ref 3.4–10.8)

## 2022-10-04 LAB — TSH+FREE T4
Free T4: 1.05 ng/dL (ref 0.82–1.77)
TSH: 2.43 u[IU]/mL (ref 0.450–4.500)

## 2022-10-04 LAB — LIPID PANEL
Chol/HDL Ratio: 4.1 ratio (ref 0.0–5.0)
HDL: 32 mg/dL — ABNORMAL LOW (ref >39)
LDL Chol Calc (NIH): 63 mg/dL (ref 0–99)
Triglycerides: 212 mg/dL — ABNORMAL HIGH (ref 0–149)

## 2022-10-04 LAB — VITAMIN B1: Thiamine: 105.4 nmol/L (ref 66.5–200.0)

## 2022-10-04 LAB — HEMOGLOBIN A1C
Est. average glucose Bld gHb Est-mCnc: 126 mg/dL
Hgb A1c MFr Bld: 6 % — ABNORMAL HIGH (ref 4.8–5.6)

## 2022-10-04 LAB — PTH, INTACT AND CALCIUM: PTH: 100 pg/mL — ABNORMAL HIGH (ref 15–65)

## 2022-10-04 LAB — B12 AND FOLATE PANEL
Folate: 17.5 ng/mL (ref >3.0)
Vitamin B-12: 595 pg/mL (ref 232–1245)

## 2022-10-04 LAB — VITAMIN D 25 HYDROXY (VIT D DEFICIENCY, FRACTURES): Vit D, 25-Hydroxy: 34.1 ng/mL (ref 30.0–100.0)

## 2022-10-04 NOTE — Progress Notes (Signed)
Normal vitamin labs; deficiency is not contributing to your tingling.

## 2022-10-04 NOTE — Progress Notes (Signed)
Stable urine micro; repeat in 1 year

## 2022-10-05 ENCOUNTER — Ambulatory Visit (INDEPENDENT_AMBULATORY_CARE_PROVIDER_SITE_OTHER): Payer: Medicare PPO | Admitting: Gastroenterology

## 2022-10-05 ENCOUNTER — Telehealth: Payer: Self-pay | Admitting: Gastroenterology

## 2022-10-05 ENCOUNTER — Telehealth: Payer: Self-pay

## 2022-10-05 ENCOUNTER — Ambulatory Visit
Admission: RE | Admit: 2022-10-05 | Discharge: 2022-10-05 | Disposition: A | Discharge status: Home / Self Care | Payer: Medicare PPO | Source: Ambulatory Visit | Attending: Family Medicine | Admitting: Family Medicine

## 2022-10-05 ENCOUNTER — Other Ambulatory Visit: Payer: Self-pay

## 2022-10-05 ENCOUNTER — Encounter: Payer: Self-pay | Admitting: Gastroenterology

## 2022-10-05 VITALS — BP 122/68 | HR 82 | Temp 98.2°F | Ht 67.0 in | Wt 221.0 lb

## 2022-10-05 DIAGNOSIS — Z1211 Encounter for screening for malignant neoplasm of colon: Secondary | ICD-10-CM

## 2022-10-05 DIAGNOSIS — N189 Chronic kidney disease, unspecified: Secondary | ICD-10-CM | POA: Diagnosis not present

## 2022-10-05 DIAGNOSIS — N138 Other obstructive and reflux uropathy: Secondary | ICD-10-CM | POA: Insufficient documentation

## 2022-10-05 DIAGNOSIS — N179 Acute kidney failure, unspecified: Secondary | ICD-10-CM | POA: Diagnosis not present

## 2022-10-05 DIAGNOSIS — K760 Fatty (change of) liver, not elsewhere classified: Secondary | ICD-10-CM

## 2022-10-05 MED ORDER — TORSEMIDE 20 MG PO TABS: 40.0000 mg | ORAL_TABLET | ORAL | Status: DC

## 2022-10-05 MED ORDER — PEG 3350-KCL-NA BICARB-NACL 420 G PO SOLR: ORAL | 0 refills | Status: DC

## 2022-10-05 NOTE — Progress Notes (Signed)
[name] MD, MRCP(U.K) [address]  [address]  [place], [place] [address]  Main: [phone]  Fax: [phone]   Primary Care Physician: Jacky Kindle, FNP  Primary Gastroenterologist:  Dr. [name]   Chief Complaint  Patient presents with   Follow-up    4 mo f/u dysphagia    HPI: Jonathon Newman is a 67 y.o. male   Summary of history :     He follows with me for dysphagia.  Seen at our office back in 2022 prior to that seen in 2019 for dysphagia..  At that time the patient was recommended to undergo a esophageal manometry but he was unable to have the probe placed after multiple attempts by staff members as he describes it. The patient has tried pantoprazole 40 mg twice a day despite which had had breakthrough symptoms .    Subsequently had a barium swallow showed no abnormality in the esophagus but there was some minimal fluid going down his lungs suggesting a transfer dysphagia  December 2023 was seen for fatty liver seen on ultrasound in July 2023.AST of 44 and ALT of 78.  No high risk factors or behavior  Interval history 06/02/2022-10/05/2022    06/02/2022: Hep A/B antibody  positive, autoimmune and viral hepatitis workup was negative. 06/17/2022: ZHY:QMVHQION of the esophagus showed no abnormalities  No issues with swallowing trying to lose weight no other complaints.  Current Outpatient Medications  Medication Sig Dispense Refill   acetaminophen (TYLENOL) 500 MG tablet Take 1,000 mg by mouth every 8 (eight) hours as needed for mild pain or moderate pain. Takes once a day     aspirin EC 81 MG tablet Take 81 mg by mouth daily. Swallow whole.     atorvastatin (LIPITOR) 40 MG tablet Take 1 tablet (40 mg total) by mouth daily. 90 tablet 3   Blood Glucose Monitoring Suppl (FREESTYLE LITE) DEVI To check blood sugar once daily 1 each 0   carvedilol (COREG) 25 MG tablet Take 1 tablet (25 mg total) by mouth 2 (two) times daily with a meal. 180 tablet 3    clobetasol (TEMOVATE) 0.05 % external solution APPLY 1 APPLICATION TOPICALLY TWICE A DAY 100 mL 6   clobetasol ointment (TEMOVATE) 0.05 % Apply 1 Application topically 2 (two) times daily as needed. 60 g 5   cyclobenzaprine (FLEXERIL) 10 MG tablet Take 10 mg by mouth 3 (three) times daily.     diazepam (VALIUM) 2 MG tablet Take 2 mg by mouth every 8 (eight) hours as needed.     empagliflozin (JARDIANCE) 10 MG TABS tablet Take 1 tablet (10 mg total) by mouth daily before breakfast. 30 tablet 5   ezetimibe (ZETIA) 10 MG tablet Take 1 tablet (10 mg total) by mouth daily. 90 tablet 1   FREESTYLE LITE test strip USE TO CHECK BLOOD SUGAR ONCE DAILY 100 strip 3   indomethacin (INDOCIN) 50 MG capsule Take 50 mg by mouth 2 (two) times daily with a meal. Taking once daily     isosorbide mononitrate (IMDUR) 60 MG 24 hr tablet TAKE 1 TABLET DAILY 90 tablet 3   ketoconazole (NIZORAL) 2 % shampoo APPLY 1 APPLICATION TOPICALLY 2 TIMES WEEKLY AS DIRECTED 120 mL 3   Lancets (FREESTYLE) lancets USE TO CHECK BLOOD SUGAR ONCE DAILY 100 each 4   metFORMIN (GLUCOPHAGE-XR) 500 MG 24 hr tablet TAKE 2 TABLETS TWICE A DAY WITH MEALS 120 tablet 1   Multiple Vitamin (MULTIVITAMIN WITH MINERALS) TABS tablet  Take 1 tablet by mouth daily.     nitroGLYCERIN (NITROSTAT) 0.4 MG SL tablet Place 1 tablet (0.4 mg total) under the tongue every 5 (five) minutes as needed for chest pain. 25 tablet 3   pantoprazole (PROTONIX) 40 MG tablet TAKE 1 TABLET DAILY 90 tablet 3   potassium chloride (KLOR-CON) 10 MEQ tablet TAKE 2 TABLETS TWICE A DAY 360 tablet 1   pregabalin (LYRICA) 150 MG capsule Take 150 mg by mouth 2 (two) times daily.     sacubitril-valsartan (ENTRESTO) 24-26 MG Take 1 tablet by mouth 2 (two) times daily.     Semaglutide,0.25 or 0.5MG /DOS, (OZEMPIC, 0.25 OR 0.5 MG/DOSE,) 2 MG/3ML SOPN Inject 0.5 mg into the skin once a week. 9 mL 0   senna (SENOKOT) 8.6 MG TABS tablet Take 1 tablet by mouth daily.     torsemide (DEMADEX)  20 MG tablet Take 2 tablets (40 mg total) by mouth every other day.     No current facility-administered medications for this visit.    Allergies as of 10/05/2022 - Review Complete 10/02/2022  Allergen Reaction Noted   Colchicine Other (See Comments) 07/17/2019   Lisinopril Cough 12/02/2017    ROS:  General: Negative for anorexia, weight loss, fever, chills, fatigue, weakness. ENT: Negative for hoarseness, difficulty swallowing , nasal congestion. CV: Negative for chest pain, angina, palpitations, dyspnea on exertion, peripheral edema.  Respiratory: Negative for dyspnea at rest, dyspnea on exertion, cough, sputum, wheezing.  GI: See history of present illness. GU:  Negative for dysuria, hematuria, urinary incontinence, urinary frequency, nocturnal urination.  Endo: Negative for unusual weight change.    Physical Examination:   BP 122/68   Pulse 82   Temp 98.2 F (36.8 C)   Ht  (1.702 m)   Wt 221 lb (100.2 kg)   BMI 34.61 kg/m   General: Well-nourished, well-developed in no acute distress.  Eyes: No icterus. Conjunctivae pink. Skin: Warm and dry, no jaundice.   Psych: Alert and cooperative, normal mood and affect.   Imaging Studies: No results found.  Assessment and Plan:   Jonathon Newman is a 67 y.o. y/o male here to follow-up for , fatty liver seen on ultrasound in July 2023 transaminases have been mildly elevated.  Has dysphagia that persists.  Autoimmune and viral hepatitis workup was negative very likely abnormal LFTs due to fatty liver disease.  Immune to hepatitis a and B   Plan 1.   Suggest pneumococcal vaccine, zoster vaccine, tetanus shot which he states he will think about.  Overdue for a screening colonoscopy which we will schedule 3.  Neutrophils been approved for treatment of noncirrhotic NASH.  We will see him back in 8  months to see if he is eligible for any of these medications.  Limit use of any alcohol    Dr [name]  MD,MRCP [facility]) Follow  up in 8 months

## 2022-10-05 NOTE — Telephone Encounter (Signed)
Pt called to schedule colonoscopy  please return call 

## 2022-10-05 NOTE — Telephone Encounter (Signed)
Pt returned call - appointment scheduled

## 2022-10-05 NOTE — Addendum Note (Signed)
Addended by: Adela Ports on: 10/05/2022 03:17 PM   Modules accepted: Orders

## 2022-10-05 NOTE — Telephone Encounter (Signed)
Patient came in and had an appointment with Dr. Tobi Bastos and then scheduled a colonoscopy.

## 2022-10-05 NOTE — Telephone Encounter (Signed)
-----   Message from Delma Freeze, [place] sent at 10/05/2022 12:46 PM EDT ----- Regarding: RE: CKD worsening Can you call him and schedule a f/u appt here with me?   Thanks,  Inetta Fermo  ----- Message ----- From: Simonne Maffucci, RN Sent: 10/05/2022  12:17 PM EDT To: Delma Freeze, FNP Subject: Annell Greening: CKD worsening                               ----- Message ----- From: Electa Sniff, RN Sent: 10/01/2022  12:13 PM EDT To: Modesta Messing, CMA; Simonne Maffucci, RN Subject: Annell Greening: CKD worsening                               ----- Message ----- From: Jacky Kindle, FNP Sent: 10/01/2022  10:08 AM EDT To: Lonell Face, MD; Antonieta Iba, MD; # Subject: CKD worsening                                  Unclear why his creatinine is acutely worsened; I will plan to get a secondary consult with nephrology and place [place] orders for his kidneys. His fluid status, edema, weight, exam were all stable this week at [facility].  Jacky Kindle, FNP  [facility] [address] [place], [place] [address] [phone] (phone) [phone] (fax) [facility]  ----- Message ----- From: Nell Range Lab Results In Sent: 10/01/2022   7:39 AM EDT To: Jacky Kindle, FNP

## 2022-10-05 NOTE — Telephone Encounter (Signed)
Patient returned call to schedule colonoscopy.  He has an office visit scheduled with Dr. Tobi Bastos today at 1pm.  I wil ask Kandis Cocking to schedule during his office visit. His last colonoscopy was with Dr. Tobi Bastos 05/26/2017.  Thank you,  Marcelino Duster, CMA

## 2022-10-05 NOTE — Telephone Encounter (Signed)
Pt made aware of Dr. Windell Hummingbird recommendations and agreeable to plan to decrease torsemide to every other day.    Antonieta Iba, MD  Jacky Kindle, FNP; P Cv Div [facility] Triage Cc: Delma Freeze, FNP; Lonell Face, MD He has had prior spikes in renal function before Suspect could be secondary to hypovolemia Would not expect 2 or 3 days of Jardiance to have caused a spike but perhaps we should decrease torsemide down to every other day for now until numbers stabilize Thx TGollan

## 2022-10-05 NOTE — Telephone Encounter (Signed)
Called pt to schedule f/u appt. Unable to reach. Left voicemail.

## 2022-10-06 DIAGNOSIS — N1832 Chronic kidney disease, stage 3b: Secondary | ICD-10-CM | POA: Diagnosis not present

## 2022-10-06 DIAGNOSIS — I129 Hypertensive chronic kidney disease with stage 1 through stage 4 chronic kidney disease, or unspecified chronic kidney disease: Secondary | ICD-10-CM | POA: Diagnosis not present

## 2022-10-06 DIAGNOSIS — R8290 Unspecified abnormal findings in urine: Secondary | ICD-10-CM | POA: Diagnosis not present

## 2022-10-06 NOTE — Progress Notes (Signed)
Reassuring [place]; likely low volume given over diuresis. Continue to follow up with Inetta Fermo, NP

## 2022-10-07 MED ORDER — CLENPIQ 10-3.5-12 MG-GM -GM/160ML PO SOLN: ORAL | 0 refills | Status: DC

## 2022-10-07 NOTE — Addendum Note (Signed)
Addended by: Adela Ports on: 10/07/2022 01:20 PM   Modules accepted: Orders

## 2022-10-08 ENCOUNTER — Other Ambulatory Visit: Payer: Self-pay | Admitting: Family Medicine

## 2022-10-08 DIAGNOSIS — E1151 Type 2 diabetes mellitus with diabetic peripheral angiopathy without gangrene: Secondary | ICD-10-CM

## 2022-10-08 MED ORDER — METFORMIN HCL ER 500 MG PO TB24
ORAL_TABLET | ORAL | 0 refills | Status: DC
Start: 2022-10-08 — End: 2023-01-18

## 2022-10-08 NOTE — Telephone Encounter (Signed)
Last refill 03/30/22 for 120 (30) days and 1 refill. Medication due for refill.  Requested Prescriptions  Pending Prescriptions Disp Refills   metFORMIN (GLUCOPHAGE-XR) 500 MG 24 hr tablet 360 tablet 0    Sig: TAKE 2 TABLETS TWICE A DAY WITH MEALS     Endocrinology:  Diabetes - Biguanides Failed - 10/08/2022  8:48 AM      Failed - Cr in normal range and within 360 days    Creatinine, Ser  Date Value Ref Range Status  09/30/2022 1.97 (H) 0.76 - 1.27 mg/dL Final         Failed - eGFR in normal range and within 360 days    GFR calc Af Amer  Date Value Ref Range Status  06/28/2020 72 >59 mL/min/1.73 Final    Comment:    **In accordance with recommendations from the NKF-ASN Task force,**   [facility] is in the process of updating its eGFR calculation to the   2021 CKD-EPI creatinine equation that estimates kidney function   without a race variable.    GFR, Estimated  Date Value Ref Range Status  02/10/2022 >60 >60 mL/min Final    Comment:    (NOTE) Calculated using the CKD-EPI Creatinine Equation (2021)    eGFR  Date Value Ref Range Status  09/30/2022 37 (L) >59 mL/min/1.73 Final         Failed - Valid encounter within last 6 months    Recent Outpatient Visits           9 months ago AKI (acute kidney injury) [facility])   [place] [facility] Merita Norton T, FNP   9 months ago Chronic diastolic CHF (congestive heart failure) (HCC)   [place] [facility] Merita Norton T, FNP   1 year ago Psoriasis of scalp   [place] [facility] Merita Norton T, FNP   1 year ago History of recent fall   [place] [facility] Merita Norton T, FNP   1 year ago Type 2 diabetes mellitus with diabetic peripheral angiopathy without gangrene, without long-term current use of insulin [facility])   [place] [facility], Marzella Schlein, MD       Future Appointments             In 2 months Jacky Kindle, FNP [facility]            Passed - HBA1C is between 0 and 7.9 and within 180 days    Hgb A1c MFr Bld  Date Value Ref Range Status  09/30/2022 6.0 (H) 4.8 - 5.6 % Final    Comment:             Prediabetes: 5.7 - 6.4          Diabetes: >6.4          Glycemic control for adults with diabetes: <7.0          Passed - B12 Level in normal range and within 720 days    Vitamin B-12  Date Value Ref Range Status  09/30/2022 595 232 - 1,245 pg/mL Final         Passed - CBC within normal limits and completed in the last 12 months    WBC  Date Value Ref Range Status  09/30/2022 5.4 3.4 - 10.8 x10E3/uL Final  01/06/2022 6.0 4.0 - 10.5 K/uL Final   RBC  Date Value Ref Range Status  09/30/2022 4.56 4.14 -  5.80 x10E6/uL Final  01/06/2022 3.89 (L) 4.22 - 5.81 MIL/uL Final   Hemoglobin  Date Value Ref Range Status  09/30/2022 14.3 13.0 - 17.7 g/dL Final   Hematocrit  Date Value Ref Range Status  09/30/2022 41.1 37.5 - 51.0 % Final   MCHC  Date Value Ref Range Status  09/30/2022 34.8 31.5 - 35.7 g/dL Final  16/03/9603 54.0 30.0 - 36.0 g/dL Final   [facility]  Date Value Ref Range Status  09/30/2022 31.4 26.6 - 33.0 pg Final  01/06/2022 30.8 26.0 - 34.0 pg Final   MCV  Date Value Ref Range Status  09/30/2022 90 79 - 97 fL Final   No results found for: "PLTCOUNTKUC", "LABPLAT", "POCPLA" RDW  Date Value Ref Range Status  09/30/2022 12.6 11.6 - 15.4 % Final

## 2022-10-08 NOTE — Telephone Encounter (Signed)
Medication Refill - Medication: metFORMIN (GLUCOPHAGE-XR) 500 MG 24 hr tablet   Has the patient contacted their pharmacy? Yes.   Pharmacy requested refill on 10/02/22.    Preferred Pharmacy (with phone number or street name):  [facility] - [place], [place] - [address] Phone: [phone]  Fax: [phone]     Has the patient been seen for an appointment in the last year OR does the patient have an upcoming appointment? Yes.    Agent: Please be advised that RX refills may take up to 3 business days. We ask that you follow-up with your pharmacy.

## 2022-10-09 ENCOUNTER — Ambulatory Visit: Payer: Medicare PPO | Attending: Family | Admitting: Family

## 2022-10-09 ENCOUNTER — Encounter: Payer: Self-pay | Admitting: Family

## 2022-10-09 VITALS — BP 114/76 | HR 81 | Wt 220.2 lb

## 2022-10-09 DIAGNOSIS — Z87891 Personal history of nicotine dependence: Secondary | ICD-10-CM | POA: Diagnosis not present

## 2022-10-09 DIAGNOSIS — I110 Hypertensive heart disease with heart failure: Secondary | ICD-10-CM | POA: Insufficient documentation

## 2022-10-09 DIAGNOSIS — Z955 Presence of coronary angioplasty implant and graft: Secondary | ICD-10-CM | POA: Diagnosis not present

## 2022-10-09 DIAGNOSIS — I10 Essential (primary) hypertension: Secondary | ICD-10-CM | POA: Diagnosis not present

## 2022-10-09 DIAGNOSIS — R42 Dizziness and giddiness: Secondary | ICD-10-CM | POA: Diagnosis not present

## 2022-10-09 DIAGNOSIS — G8929 Other chronic pain: Secondary | ICD-10-CM | POA: Diagnosis not present

## 2022-10-09 DIAGNOSIS — E119 Type 2 diabetes mellitus without complications: Secondary | ICD-10-CM | POA: Diagnosis not present

## 2022-10-09 DIAGNOSIS — I5032 Chronic diastolic (congestive) heart failure: Secondary | ICD-10-CM

## 2022-10-09 DIAGNOSIS — I890 Lymphedema, not elsewhere classified: Secondary | ICD-10-CM | POA: Insufficient documentation

## 2022-10-09 DIAGNOSIS — R0602 Shortness of breath: Secondary | ICD-10-CM | POA: Diagnosis not present

## 2022-10-09 DIAGNOSIS — M549 Dorsalgia, unspecified: Secondary | ICD-10-CM | POA: Diagnosis not present

## 2022-10-09 DIAGNOSIS — N400 Enlarged prostate without lower urinary tract symptoms: Secondary | ICD-10-CM | POA: Diagnosis not present

## 2022-10-09 DIAGNOSIS — R531 Weakness: Secondary | ICD-10-CM | POA: Diagnosis not present

## 2022-10-09 DIAGNOSIS — R202 Paresthesia of skin: Secondary | ICD-10-CM

## 2022-10-09 DIAGNOSIS — E785 Hyperlipidemia, unspecified: Secondary | ICD-10-CM | POA: Diagnosis not present

## 2022-10-09 DIAGNOSIS — Z951 Presence of aortocoronary bypass graft: Secondary | ICD-10-CM | POA: Diagnosis not present

## 2022-10-09 DIAGNOSIS — I2510 Atherosclerotic heart disease of native coronary artery without angina pectoris: Secondary | ICD-10-CM | POA: Insufficient documentation

## 2022-10-09 DIAGNOSIS — I25118 Atherosclerotic heart disease of native coronary artery with other forms of angina pectoris: Secondary | ICD-10-CM

## 2022-10-09 DIAGNOSIS — Z7984 Long term (current) use of oral hypoglycemic drugs: Secondary | ICD-10-CM | POA: Diagnosis not present

## 2022-10-09 DIAGNOSIS — Z8249 Family history of ischemic heart disease and other diseases of the circulatory system: Secondary | ICD-10-CM | POA: Diagnosis not present

## 2022-10-09 DIAGNOSIS — E1151 Type 2 diabetes mellitus with diabetic peripheral angiopathy without gangrene: Secondary | ICD-10-CM | POA: Diagnosis not present

## 2022-10-09 DIAGNOSIS — I5030 Unspecified diastolic (congestive) heart failure: Secondary | ICD-10-CM | POA: Diagnosis not present

## 2022-10-09 NOTE — Progress Notes (Signed)
Patient ID: Jonathon Newman, male    DOB: Dec 19, 1955, 67 y.o.   MRN: [id number]  Primary cardiologist: Julien Nordmann, MD (last seen 01/24) PCP: Jacky Kindle, FNP (last seen 09/30/22)  HPI  Jonathon Newman is a 67 y/o male with a history of CAD, DM, hyperlipidemia, HTN, BPH, GERD, previous tobacco use and chronic heart failure.   Echo 12/11/21: EF of 55-60%  LHC 05/02/18:  The left ventricular systolic function is normal. LV end diastolic pressure is mildly elevated. The left ventricular ejection fraction is 55-65% by visual estimate. Prox RCA to Mid RCA lesion is 10% stenosed. Prox RCA lesion is 30% stenosed. Dist RCA lesion is 20% stenosed. SVG graft was visualized by angiography. The graft exhibits mild diffuse disease. Ost Cx to Prox Cx lesion is 95% stenosed. Mid LM to Dist LM lesion is 80% stenosed. LIMA and is normal in caliber. The graft exhibits no disease.  1. Significant underlying left main and RCA disease with patent grafts including LIMA to LAD and SVG to OM 2.  Patent RCA stent with minimal restenosis. 2.  Normal LV systolic function.  Mildly elevated left ventricular end-diastolic pressure at 14 to 15 mmHg.  Has not been admitted or been in the ED in the last 6 months.   He presents today for a HF visit after PCP noted worsening renal function after we had started jardiance. He presents with a chief complaint of moderate fatigue with little exertion. Chronic in nature. Has SOB, pedal edema, dizziness, numbness/tingling in left clavicle/ neck, weakness & chronic back pain along with this. Denies difficulty sleeping, abdominal distention, palpitations, chest pain, wheezing, cough or weight gain.   Continues to drink too much fluids but says that his mouth stays so dry. Drinking 3.5-5 twenty ounce bottles of water and 2 cups of coffee daily. Using NoSalt for seasoning.  Saw his PCP 4/17 and had labs. Creatinine noted to be 1.97 with eGFR 37 which was worse from previous  labs of 1.29/ >60. Renal ultrasound was done which was normal. Saw nephrology 3 days ago.   Past Medical History:  Diagnosis Date   Anginal pain (HCC)    Arthritis    BPH (benign prostatic hyperplasia)    CHF (congestive heart failure) (HCC)    Coronary artery disease 2010   a.) LHC 2010: high grade RCA stenosis -> 2.5 x 26mm Cypher DES to RCA. b.) NSTEMI 2016 -> PCI revealed pat RCA stent; sig dLM Dz with FFR ratio 0.76 and oLCx stenosis; ref to CVTS. c.) 2v CABG 04/21/2015; LIMA-LAD, SVG-OM2. d.) [facility] 05/02/2018: EF 55-65%; LVEDP 14-15 mmHg; 10% p-m RCA, 30% pRCA, 20% dRCA, 95% o-pLCx, 80% m-dLM; LIMA graft pat. SVG graft with mild diffuse Dz.   DDD (degenerative disc disease), lumbar    Dyspnea    GERD (gastroesophageal reflux disease)    Hypercholesteremia    Hypertension    Left foot drop    NSTEMI (non-ST elevated myocardial infarction) (HCC) 2016   a.) PCI revealed patent RCA stent; significant dLM disease with FFR ratio 0.76 and oLCx stenosis; refer to CVTS for CABG.   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 LIMA to LAD amd SVG to OM2   CORONARY STENT PLACEMENT  2010   Cordis Cypher Sirolimus-eluting stent 2.50 mm x 26 mm placed to the RCA at [facility]    ESOPHAGEAL MANOMETRY N/A 08/04/2017   Procedure: ESOPHAGEAL MANOMETRY (EM);  Surgeon: Toney Reil, MD;  Location: [facility] ENDOSCOPY;  Service: Endoscopy;  Laterality: N/A;   ESOPHAGOGASTRODUODENOSCOPY (EGD) WITH PROPOFOL N/A 06/17/2022   Procedure: ESOPHAGOGASTRODUODENOSCOPY (EGD) WITH PROPOFOL;  Surgeon: [name], MD;  Location: [facility] ENDOSCOPY;  Service: Gastroenterology;  Laterality: N/A;   FRACTURE SURGERY     KNEE SURGERY     KNEE SURGERY     right knee    LAMINECTOMY     "plate in neck Z6-X0"   LEFT HEART CATH AND CORS/GRAFTS ANGIOGRAPHY N/A 05/02/2018   Procedure: CORS/GRAFTS ANGIOGRAPHY;  Surgeon: Iran Ouch, MD;  Location: [facility] INVASIVE CV LAB;  Service: Cardiovascular;  Laterality: N/A;   RIGHT/LEFT HEART CATH AND CORONARY ANGIOGRAPHY Bilateral 05/02/2018   Procedure: LEFT HEART CATH;  Surgeon: Iran Ouch, MD;  Location: [facility] INVASIVE CV LAB;  Service: Cardiovascular;  Laterality: Bilateral;   THORACIC LAMINECTOMY FOR SPINAL CORD STIMULATOR N/A 05/26/2021   Procedure: THORACIC SPINAL CORD STIMULATOR AND PULSE GENERATOR PLACEMENT (MEDTRONIC);  Surgeon: Lucy Chris, MD;  Location: [facility] ORS;  Service: Neurosurgery;  Laterality: N/A;   VASECTOMY     Family History  Problem Relation Age of Onset   Arthritis Mother    Hyperlipidemia Father    Hypertension Father    Heart attack Father 7   Heart murmur Brother    Valvular heart disease Brother    Cataracts Maternal Grandmother    Glaucoma Maternal Grandmother    Cancer Maternal Grandfather    Heart attack Paternal Grandfather    Hypertension Paternal Grandfather    Prostate cancer Neg Hx    Bladder Cancer Neg Hx    Kidney cancer Neg Hx    Social History   Tobacco Use   Smoking status: Former    Packs/day: 0.25    Years: 2.00    Additional pack years: 0.00    Total pack years: 0.50    Types: Cigarettes    Quit date: 05/26/2005    Years since quitting: 17.3   Smokeless tobacco: Former    Types: Chew  Substance Use Topics   Alcohol use: No   Allergies  Allergen Reactions   Colchicine Other (See Comments)    Palpitations and headache   Lisinopril Cough   Prior to Admission medications   Medication Sig Start Date End Date Taking? Authorizing Provider  acetaminophen (TYLENOL) 500 MG tablet Take 1,000 mg  by mouth every 8 (eight) hours as needed for mild pain or moderate pain. Takes once a day   Yes [provider]  aspirin EC 81 MG tablet Take 81 mg by mouth daily. Swallow whole.   Yes [provider]  atorvastatin (LIPITOR) 40 MG tablet Take 1 tablet (40 mg total) by mouth daily. 06/30/22  Yes Antonieta Iba, MD  Blood Glucose Monitoring Suppl (FREESTYLE LITE) DEVI To check blood sugar once daily 07/20/19  Yes Margaretann Loveless, PA-C  carvedilol (COREG) 25 MG tablet Take 1 tablet (25 mg total) by mouth 2 (two) times daily with a meal. 11/05/21  Yes Antonieta Iba, MD  Cholecalciferol (VITAMIN D3) 50 MCG (2000 UT) capsule Take 2,000 Units by mouth daily.   Yes [provider]  clobetasol (TEMOVATE) 0.05 % external solution APPLY 1 APPLICATION TOPICALLY TWICE A DAY 08/18/22  Yes Jacky Kindle, FNP  clobetasol ointment (TEMOVATE) 0.05 % Apply 1 Application topically 2 (two) times daily as needed. 09/30/22  Yes Merita Norton T, FNP  cyanocobalamin (VITAMIN B12) 1000 MCG tablet Take 1,000 mcg by mouth daily.   Yes [provider]  cyclobenzaprine (FLEXERIL) 10 MG tablet Take 10 mg by mouth 3 (three) times daily.   Yes [provider]  diazepam (VALIUM) 2 MG tablet Take 2 mg by mouth every 8 (eight) hours as needed. 07/28/22  Yes [provider]  empagliflozin (JARDIANCE) 10 MG TABS tablet Take 1 tablet (10 mg total) by mouth daily before breakfast. 09/28/22  Yes Clarisa Kindred A, FNP  ezetimibe (ZETIA) 10 MG tablet Take 1 tablet (10 mg total) by mouth daily. 10/02/22  Yes Delma Freeze, FNP  FREESTYLE LITE test strip USE TO CHECK BLOOD SUGAR ONCE DAILY 01/13/22  Yes Jacky Kindle, FNP  indomethacin (INDOCIN) 50 MG capsule Take 50 mg by mouth daily. Taking once daily   Yes [provider]  isosorbide mononitrate (IMDUR) 60 MG 24 hr tablet TAKE 1 TABLET DAILY 01/20/22  Yes Gollan, Tollie Pizza, MD  ketoconazole (NIZORAL) 2 % shampoo APPLY 1  APPLICATION TOPICALLY 2 TIMES WEEKLY AS DIRECTED 08/04/22  Yes Merita Norton T, FNP  Lancets (FREESTYLE) lancets USE TO CHECK BLOOD SUGAR ONCE DAILY 12/23/20  Yes Erasmo Downer, MD  metFORMIN (GLUCOPHAGE-XR) 500 MG 24 hr tablet TAKE 2 TABLETS TWICE A DAY WITH MEALS 10/08/22  Yes Jacky Kindle, FNP  Multiple Vitamin (MULTIVITAMIN WITH MINERALS) TABS tablet Take 1 tablet by mouth daily.   Yes [provider]  nitroGLYCERIN (NITROSTAT) 0.4 MG SL tablet Place 1 tablet (0.4 mg total) under the tongue every 5 (five) minutes as needed for chest pain. 02/10/22  Yes Clarisa Kindred A, FNP  pantoprazole (PROTONIX) 40 MG tablet TAKE 1 TABLET DAILY 08/25/22  Yes Bacigalupo, Marzella Schlein, MD  potassium chloride (KLOR-CON) 10 MEQ tablet TAKE 2 TABLETS TWICE A DAY 07/24/22  Yes Jacky Kindle, FNP  pregabalin (LYRICA) 150 MG capsule Take 150 mg by mouth 2 (two) times daily. 09/29/22  Yes [provider]  sacubitril-valsartan (ENTRESTO) 24-26 MG Take 1 tablet by mouth 2 (two) times daily.   Yes [provider]  Semaglutide,0.25 or 0.5MG /DOS, (OZEMPIC, 0.25 OR 0.5 MG/DOSE,) 2 MG/3ML SOPN Inject 0.5 mg into the skin once a week. 08/13/22  Yes Jacky Kindle, FNP  senna (SENOKOT) 8.6 MG TABS tablet Take 1 tablet by mouth daily.   Yes [provider]  Sod Picosulfate-Mag Ox-Cit Acd (CLENPIQ) 10-3.5-12 MG-GM -GM/160ML SOLN Take 1 bottle at 5 PM followed by five 8 oz cups of water and repeat 5 hours before procedure. 10/07/22  Yes [name], MD  torsemide (DEMADEX) 20 MG tablet Take 2 tablets (40 mg total) by mouth every other day. Patient taking differently: Take 40 mg by mouth daily. 10/05/22  Yes Antonieta Iba, MD   Review of Systems  Constitutional:  Positive for fatigue (tire easily). Negative for appetite change.  HENT:  Negative for congestion, postnasal drip and sore throat.   Eyes: Negative.   Respiratory:  Positive for shortness of breath (at times). Negative for cough, chest  tightness and  wheezing.   Cardiovascular:  Positive for leg swelling (improving). Negative for chest pain and palpitations.  Gastrointestinal:  Negative for abdominal distention and abdominal pain.  Endocrine: Negative.   Genitourinary: Negative.   Musculoskeletal:  Positive for back pain (has stimulator implanted) and neck pain.  Skin: Negative.   Allergic/Immunologic: Negative.   Neurological:  Positive for dizziness (all the time), weakness and numbness (tingling clavicle/ shoulder area). Negative for light-headedness and headaches.  Hematological:  Negative for adenopathy. Does not bruise/bleed easily.  Psychiatric/Behavioral:  Negative for dysphoric mood and sleep disturbance (wearing CPAP, sleeping on 2 pillows). The patient is not nervous/anxious.    Vitals:   10/09/22 1046  BP: 114/76  Pulse: 81  SpO2: 96%  Weight: 220 lb 4 oz (99.9 kg)   Wt Readings from Last 3 Encounters:  10/09/22 220 lb 4 oz (99.9 kg)  10/05/22 221 lb (100.2 kg)  09/30/22 226 lb (102.5 kg)   Lab Results  Component Value Date   CREATININE 1.97 (H) 09/30/2022   CREATININE 1.29 (H) 02/10/2022   CREATININE 1.14 01/09/2022   Physical Exam Vitals and nursing note reviewed.  Constitutional:      Appearance: Normal appearance.  HENT:     Head: Normocephalic and atraumatic.  Cardiovascular:     Rate and Rhythm: Normal rate and regular rhythm.  Pulmonary:     Effort: Pulmonary effort is normal. No respiratory distress.     Breath sounds: No wheezing or rales.  Abdominal:     General: There is no distension.     Palpations: Abdomen is soft.  Musculoskeletal:        General: No tenderness.     Cervical back: Normal range of motion and neck supple.     Right lower leg: No edema.     Left lower leg: Edema (1+ pitting) present.  Skin:    General: Skin is warm and dry.  Neurological:     General: No focal deficit present.     Mental Status: He is alert and oriented to person, place, and time.   Psychiatric:        Mood and Affect: Mood normal.        Behavior: Behavior normal.        Thought Content: Thought content normal.   Assessment & Plan:  1: Ischemic heart failure with preserved ejection fraction- - Etiology: CAD w/ previous CABG - NYHA class III - euvolemic today - weighing daily; reminded to call for an overnight weight gain of > 2 pounds or a weekly weight gain of > 5 pounds - weight down 6 pounds from last visit here 2 weeks ago - Echo 12/11/21: EF of 55-60% - continue carvedilol 25mg  BID - continue entresto 24/26mg  BID - continue torsemide 40mg  daily w/ extra 40mg  PRN/ potassium BID - as weight, edema continues to decrease try to decrease torsemide to 20mg  daily - continue jardiance 10mg  daily - using NoSalt; does eat out occasionally but no more than usual - knows to keep daily fluid intake to 64 ounces but says that he's drinking way more than that due to his dry mouth  - encouraged to put compression socks on  - BNP 01/06/22 was 73.0  2: HTN- - BP 114/76 - saw PCP Suzie Portela) 09/30/22 - saw nephrology Thedore Mins) 10/06/22; returns in 2 weeks - BMP 09/30/22 reviewed and showed sodium 139, potassium 4.2, creatinine 1.97 and GFR 37  3: DM- - A1c 09/30/22 was 6.0%  4: CAD- - saw cardiology (  Gollan) 01/24 - LHC 05/02/18:  The left ventricular systolic function is normal. LV end diastolic pressure is mildly elevated. The left ventricular ejection fraction is 55-65% by visual estimate. Prox RCA to Mid RCA lesion is 10% stenosed. Prox RCA lesion is 30% stenosed. Dist RCA lesion is 20% stenosed. SVG graft was visualized by angiography. The graft exhibits mild diffuse disease. Ost Cx to Prox Cx lesion is 95% stenosed. Mid LM to Dist LM lesion is 80% stenosed. LIMA and is normal in caliber. The graft exhibits no disease.  1. Significant underlying left main and RCA disease with patent grafts including LIMA to LAD and SVG to OM 2.  Patent RCA stent with minimal  restenosis. 2.  Normal LV systolic function.  Mildly elevated left ventricular end-diastolic pressure at 14 to 15 mmHg.  5: Lymphedema- - saw vascular (Dew) 03/24/22 - wearing compression socks a few times a week with good results; encouraged to wear them daily  6: Tingling of left side of face- - encouraged to f/u with his neurologist regarding this; he says that he has mentioned it already but will mention this again  Return in 3 weeks, sooner if needed.

## 2022-10-12 DIAGNOSIS — R200 Anesthesia of skin: Secondary | ICD-10-CM | POA: Diagnosis not present

## 2022-10-12 DIAGNOSIS — M25522 Pain in left elbow: Secondary | ICD-10-CM | POA: Diagnosis not present

## 2022-10-12 DIAGNOSIS — M5412 Radiculopathy, cervical region: Secondary | ICD-10-CM | POA: Diagnosis not present

## 2022-10-12 DIAGNOSIS — M47812 Spondylosis without myelopathy or radiculopathy, cervical region: Secondary | ICD-10-CM | POA: Diagnosis not present

## 2022-10-13 ENCOUNTER — Ambulatory Visit: Payer: Medicare PPO

## 2022-10-17 ENCOUNTER — Other Ambulatory Visit: Payer: Self-pay | Admitting: Family Medicine

## 2022-10-17 DIAGNOSIS — E1151 Type 2 diabetes mellitus with diabetic peripheral angiopathy without gangrene: Secondary | ICD-10-CM

## 2022-10-19 DIAGNOSIS — I129 Hypertensive chronic kidney disease with stage 1 through stage 4 chronic kidney disease, or unspecified chronic kidney disease: Secondary | ICD-10-CM | POA: Diagnosis not present

## 2022-10-19 DIAGNOSIS — N1831 Chronic kidney disease, stage 3a: Secondary | ICD-10-CM | POA: Diagnosis not present

## 2022-10-19 DIAGNOSIS — N2581 Secondary hyperparathyroidism of renal origin: Secondary | ICD-10-CM | POA: Diagnosis not present

## 2022-10-19 DIAGNOSIS — I10 Essential (primary) hypertension: Secondary | ICD-10-CM | POA: Diagnosis not present

## 2022-10-19 NOTE — Telephone Encounter (Signed)
Requested medications are due for refill today.  Unsure  Requested medications are on the active medications list.  yes  Last refill. 08/13/2022  Future visit scheduled.   yes  Notes to clinic.  Please review for refill.    Requested Prescriptions  Pending Prescriptions Disp Refills   OZEMPIC, 0.25 OR 0.5 MG/DOSE, 2 MG/3ML SOPN [Pharmacy Med Name: OZEMPIC PEN (0.25/0.5MG ) 2MG /3ML] 9 mL 3    Sig: INJECT 0.5 MG UNDER THE SKIN ONCE A WEEK     There is no refill protocol information for this order

## 2022-10-23 ENCOUNTER — Other Ambulatory Visit: Payer: Medicare PPO

## 2022-10-23 ENCOUNTER — Other Ambulatory Visit: Payer: Self-pay

## 2022-10-23 DIAGNOSIS — I5032 Chronic diastolic (congestive) heart failure: Secondary | ICD-10-CM

## 2022-10-23 MED ORDER — EMPAGLIFLOZIN 10 MG PO TABS
10.0000 mg | ORAL_TABLET | Freq: Every day | ORAL | 5 refills | Status: DC
Start: 2022-10-23 — End: 2023-03-30

## 2022-10-27 ENCOUNTER — Encounter: Payer: Self-pay | Admitting: Gastroenterology

## 2022-10-27 NOTE — Anesthesia Preprocedure Evaluation (Signed)
Anesthesia Evaluation  Patient identified by MRN, date of birth, ID band Patient awake    Reviewed: Allergy & Precautions, NPO status , Patient's Chart, lab work & pertinent test results  Airway Mallampati: III  TM Distance: >3 FB Neck ROM: full    Dental  (+) Teeth Intact   Pulmonary shortness of breath, sleep apnea , former smoker   Pulmonary exam normal  + decreased breath sounds      Cardiovascular Exercise Tolerance: Poor hypertension, Pt. on medications + CAD, + Past MI, + CABG (x2) and +CHF (HFpEF)  Normal cardiovascular exam Rhythm:Regular Rate:Normal     Neuro/Psych  negative psych ROS   GI/Hepatic Neg liver ROS,GERD  Medicated,,  Endo/Other  diabetes, Well Controlled    Renal/GU CRFRenal disease  negative genitourinary   Musculoskeletal  (+) Arthritis ,    Abdominal  (+) + obese  Peds negative pediatric ROS (+)  Hematology negative hematology ROS (+)   Anesthesia Other Findings Past Medical History: No date: Anginal pain (HCC) No date: Arthritis No date: BPH (benign prostatic hyperplasia) No date: CHF (congestive heart failure) (HCC) 2010: Coronary artery disease     Comment:  a.) LHC 2010: high grade RCA stenosis -> 2.5 x 26mm               Cypher DES to RCA. b.) NSTEMI 2016 -> PCI revealed pat               RCA stent; sig dLM Dz with FFR ratio 0.76 and oLCx               stenosis; ref to CVTS. c.) 2v CABG 04/21/2015; LIMA-LAD,               SVG-OM2. d.) [facility] 05/02/2018: EF 55-65%; LVEDP 14-15               mmHg; 10% p-m RCA, 30% pRCA, 20% dRCA, 95% o-pLCx, 80%               m-dLM; LIMA graft pat. SVG graft with mild diffuse Dz. No date: DDD (degenerative disc disease), lumbar No date: Dyspnea No date: GERD (gastroesophageal reflux disease) No date: Hypercholesteremia No date: Hypertension No date: Left foot drop 2016: NSTEMI (non-ST elevated myocardial infarction) (HCC)     Comment:  a.) PCI  revealed patent RCA stent; significant dLM               disease with FFR ratio 0.76 and oLCx stenosis; refer to               CVTS for CABG. No date: OSA on CPAP No date: Postlaminectomy syndrome 04/21/2015: S/P CABG x 2     Comment:  a.) 2v CABG; LIMA-LAD, SVG-OM2 No date: T2DM (type 2 diabetes mellitus) (HCC)  Past Surgical History: No date: APPENDECTOMY 11/01/2017: BACK SURGERY     Comment:  SL 5 and S1 No date: CARDIAC CATHETERIZATION No date: CARPAL TUNNEL RELEASE     Comment:  left hand No date: CHOLECYSTECTOMY No date: CHOLECYSTECTOMY 04/21/2015: CORONARY ARTERY BYPASS GRAFT     Comment:  CABG x 2 [place] V.A. LIMA to LAD amd SVG to OM2 2010: CORONARY STENT PLACEMENT     Comment:  Cordis Cypher Sirolimus-eluting stent 2.50 mm x 26 mm               placed to the RCA at [facility]  08/04/2017: ESOPHAGEAL MANOMETRY; N/A     Comment:  Procedure: ESOPHAGEAL  MANOMETRY (EM);  Surgeon: Toney Reil, MD;  Location: [facility] ENDOSCOPY;  Service:               Endoscopy;  Laterality: N/A; No date: FRACTURE SURGERY No date: KNEE SURGERY No date: KNEE SURGERY     Comment:  right knee  No date: LAMINECTOMY     Comment:  "plate in neck W1-X9" 05/02/2018: LEFT HEART CATH AND CORS/GRAFTS ANGIOGRAPHY; N/A     Comment:  Procedure: CORS/GRAFTS ANGIOGRAPHY;  Surgeon: Iran Ouch, MD;  Location: [facility] INVASIVE CV LAB;                Service: Cardiovascular;  Laterality: N/A; 05/02/2018: RIGHT/LEFT HEART CATH AND CORONARY ANGIOGRAPHY; Bilateral     Comment:  Procedure: LEFT HEART CATH;  Surgeon: Iran Ouch,              MD;  Location: [facility] INVASIVE CV LAB;  Service:               Cardiovascular;  Laterality: Bilateral; 05/26/2021: THORACIC LAMINECTOMY FOR SPINAL CORD STIMULATOR; N/A     Comment:  Procedure: THORACIC SPINAL CORD STIMULATOR AND PULSE               GENERATOR PLACEMENT (MEDTRONIC);  Surgeon: Lucy Chris,                MD;  Location: [facility] ORS;  Service: Neurosurgery;                Laterality: N/A; No date: VASECTOMY  BMI    Body Mass Index: 36.81 kg/m      Reproductive/Obstetrics negative OB ROS                              Anesthesia Physical Anesthesia Plan  ASA: 3  Anesthesia Plan: General   Post-op Pain Management:    Induction: Intravenous  PONV Risk Score and Plan: Propofol infusion and TIVA  Airway Management Planned: Natural Airway  Additional Equipment:   Intra-op Plan:   Post-operative Plan:   Informed Consent:      Dental Advisory Given  Plan Discussed with: CRNA and Surgeon  Anesthesia Plan Comments:         Anesthesia Quick Evaluation

## 2022-10-28 ENCOUNTER — Other Ambulatory Visit: Payer: Self-pay | Admitting: Family Medicine

## 2022-10-28 ENCOUNTER — Encounter
Admission: RE | Disposition: A | Discharge status: Home / Self Care | Payer: Self-pay | Source: Home / Self Care | Attending: Gastroenterology

## 2022-10-28 ENCOUNTER — Ambulatory Visit: Payer: Medicare HMO | Admitting: Anesthesiology

## 2022-10-28 ENCOUNTER — Other Ambulatory Visit: Payer: Self-pay

## 2022-10-28 ENCOUNTER — Encounter: Payer: Self-pay | Admitting: Gastroenterology

## 2022-10-28 ENCOUNTER — Ambulatory Visit
Admission: RE | Admit: 2022-10-28 | Discharge: 2022-10-28 | Disposition: A | Discharge status: Home / Self Care | Payer: Medicare HMO | Attending: Gastroenterology | Admitting: Gastroenterology

## 2022-10-28 DIAGNOSIS — Z1211 Encounter for screening for malignant neoplasm of colon: Secondary | ICD-10-CM | POA: Diagnosis not present

## 2022-10-28 DIAGNOSIS — I5032 Chronic diastolic (congestive) heart failure: Secondary | ICD-10-CM | POA: Diagnosis not present

## 2022-10-28 DIAGNOSIS — G4730 Sleep apnea, unspecified: Secondary | ICD-10-CM | POA: Insufficient documentation

## 2022-10-28 DIAGNOSIS — Z951 Presence of aortocoronary bypass graft: Secondary | ICD-10-CM | POA: Diagnosis not present

## 2022-10-28 DIAGNOSIS — Z87891 Personal history of nicotine dependence: Secondary | ICD-10-CM | POA: Insufficient documentation

## 2022-10-28 DIAGNOSIS — I130 Hypertensive heart and chronic kidney disease with heart failure and stage 1 through stage 4 chronic kidney disease, or unspecified chronic kidney disease: Secondary | ICD-10-CM | POA: Diagnosis not present

## 2022-10-28 DIAGNOSIS — M1990 Unspecified osteoarthritis, unspecified site: Secondary | ICD-10-CM | POA: Insufficient documentation

## 2022-10-28 DIAGNOSIS — K219 Gastro-esophageal reflux disease without esophagitis: Secondary | ICD-10-CM | POA: Diagnosis not present

## 2022-10-28 DIAGNOSIS — I2510 Atherosclerotic heart disease of native coronary artery without angina pectoris: Secondary | ICD-10-CM | POA: Diagnosis not present

## 2022-10-28 DIAGNOSIS — N400 Enlarged prostate without lower urinary tract symptoms: Secondary | ICD-10-CM | POA: Insufficient documentation

## 2022-10-28 DIAGNOSIS — I5030 Unspecified diastolic (congestive) heart failure: Secondary | ICD-10-CM | POA: Insufficient documentation

## 2022-10-28 DIAGNOSIS — N189 Chronic kidney disease, unspecified: Secondary | ICD-10-CM | POA: Diagnosis not present

## 2022-10-28 DIAGNOSIS — E7800 Pure hypercholesterolemia, unspecified: Secondary | ICD-10-CM | POA: Insufficient documentation

## 2022-10-28 DIAGNOSIS — I110 Hypertensive heart disease with heart failure: Secondary | ICD-10-CM | POA: Diagnosis not present

## 2022-10-28 DIAGNOSIS — E119 Type 2 diabetes mellitus without complications: Secondary | ICD-10-CM | POA: Diagnosis not present

## 2022-10-28 DIAGNOSIS — E1122 Type 2 diabetes mellitus with diabetic chronic kidney disease: Secondary | ICD-10-CM | POA: Diagnosis not present

## 2022-10-28 DIAGNOSIS — I252 Old myocardial infarction: Secondary | ICD-10-CM | POA: Diagnosis not present

## 2022-10-28 DIAGNOSIS — R0602 Shortness of breath: Secondary | ICD-10-CM | POA: Diagnosis not present

## 2022-10-28 HISTORY — PX: COLONOSCOPY WITH PROPOFOL: SHX5780

## 2022-10-28 LAB — GLUCOSE, CAPILLARY: Glucose-Capillary: 117 mg/dL — ABNORMAL HIGH (ref 70–99)

## 2022-10-28 SURGERY — COLONOSCOPY WITH PROPOFOL
Anesthesia: General

## 2022-10-28 MED ORDER — PROPOFOL 1000 MG/100ML IV EMUL
INTRAVENOUS | Status: AC
  Filled 2022-10-28: qty 100

## 2022-10-28 MED ORDER — SODIUM CHLORIDE 0.9 % IV SOLN: INTRAVENOUS | Status: DC

## 2022-10-28 MED ORDER — PROPOFOL 500 MG/50ML IV EMUL
INTRAVENOUS | Status: DC | PRN
  Administered 2022-10-28: 150 ug/kg/min via INTRAVENOUS

## 2022-10-28 MED ORDER — PROPOFOL 10 MG/ML IV BOLUS
INTRAVENOUS | Status: DC | PRN
  Administered 2022-10-28: 80 mg via INTRAVENOUS

## 2022-10-28 MED ORDER — LIDOCAINE HCL (CARDIAC) PF 100 MG/5ML IV SOSY
PREFILLED_SYRINGE | INTRAVENOUS | Status: DC | PRN
  Administered 2022-10-28: 50 mg via INTRAVENOUS

## 2022-10-28 MED ORDER — STERILE WATER FOR IRRIGATION IR SOLN
Status: DC | PRN
  Administered 2022-10-28: 60 mL

## 2022-10-28 MED ORDER — LIDOCAINE HCL (PF) 2 % IJ SOLN
INTRAMUSCULAR | Status: AC
  Filled 2022-10-28: qty 5

## 2022-10-28 NOTE — H&P (Signed)
[name], MD [address], [address], [place], [place], [address] [address], [place], [place], [address] Phone: [phone]  Fax: [phone]  Primary Care Physician:  Jacky Kindle, FNP   Pre-Procedure History & Physical: HPI:  Jonathon Newman is a 67 y.o. male is here for an colonoscopy.   Past Medical History:  Diagnosis Date   Anginal pain (HCC)    Arthritis    BPH (benign prostatic hyperplasia)    CHF (congestive heart failure) (HCC)    Coronary artery disease 2010   a.) LHC 2010: high grade RCA stenosis -> 2.5 x 26mm Cypher DES to RCA. b.) NSTEMI 2016 -> PCI revealed pat RCA stent; sig dLM Dz with FFR ratio 0.76 and oLCx stenosis; ref to CVTS. c.) 2v CABG 04/21/2015; LIMA-LAD, SVG-OM2. d.) [facility] 05/02/2018: EF 55-65%; LVEDP 14-15 mmHg; 10% p-m RCA, 30% pRCA, 20% dRCA, 95% o-pLCx, 80% m-dLM; LIMA graft pat. SVG graft with mild diffuse Dz.   DDD (degenerative disc disease), lumbar    Dyspnea    GERD (gastroesophageal reflux disease)    Hypercholesteremia    Hypertension    Left foot drop    NSTEMI (non-ST elevated myocardial infarction) (HCC) 2016   a.) PCI revealed patent RCA stent; significant dLM disease with FFR ratio 0.76 and oLCx stenosis; refer to CVTS for CABG.   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 LIMA to LAD amd SVG to OM2   CORONARY STENT PLACEMENT  2010   Cordis Cypher Sirolimus-eluting stent 2.50 mm x 26 mm placed to the RCA at [facility]    ESOPHAGEAL MANOMETRY N/A 08/04/2017   Procedure: ESOPHAGEAL MANOMETRY (EM);   Surgeon: Toney Reil, MD;  Location: [facility] ENDOSCOPY;  Service: Endoscopy;  Laterality: N/A;   ESOPHAGOGASTRODUODENOSCOPY (EGD) WITH PROPOFOL N/A 06/17/2022   Procedure: ESOPHAGOGASTRODUODENOSCOPY (EGD) WITH PROPOFOL;  Surgeon: [name], MD;  Location: [facility] ENDOSCOPY;  Service: Gastroenterology;  Laterality: N/A;   FRACTURE SURGERY     KNEE SURGERY     KNEE SURGERY     right knee    LAMINECTOMY     "plate in neck B2-W4"   LEFT HEART CATH AND CORS/GRAFTS ANGIOGRAPHY N/A 05/02/2018   Procedure: CORS/GRAFTS ANGIOGRAPHY;  Surgeon: Iran Ouch, MD;  Location: [facility] INVASIVE CV LAB;  Service: Cardiovascular;  Laterality: N/A;   RIGHT/LEFT HEART CATH AND CORONARY ANGIOGRAPHY Bilateral 05/02/2018   Procedure: LEFT HEART CATH;  Surgeon: Iran Ouch, MD;  Location: [facility] INVASIVE CV LAB;  Service: Cardiovascular;  Laterality: Bilateral;   THORACIC LAMINECTOMY FOR SPINAL CORD STIMULATOR N/A 05/26/2021   Procedure: THORACIC SPINAL CORD STIMULATOR AND PULSE GENERATOR PLACEMENT (MEDTRONIC);  Surgeon: Lucy Chris, MD;  Location: [facility] ORS;  Service: Neurosurgery;  Laterality: N/A;   VASECTOMY      Prior to Admission medications   Medication Sig Start Date End Date Taking? Authorizing Provider  aspirin EC 81 MG tablet Take 81  mg by mouth daily. Swallow whole.   Yes [provider]  atorvastatin (LIPITOR) 40 MG tablet Take 1 tablet (40 mg total) by mouth daily. 06/30/22  Yes Antonieta Iba, MD  carvedilol (COREG) 25 MG tablet Take 1 tablet (25 mg total) by mouth 2 (two) times daily with a meal. 11/05/21  Yes Gollan, Tollie Pizza, MD  ezetimibe (ZETIA) 10 MG tablet Take 1 tablet (10 mg total) by mouth daily. 10/02/22  Yes Clarisa Kindred A, FNP  metFORMIN (GLUCOPHAGE-XR) 500 MG 24 hr tablet TAKE 2 TABLETS TWICE A DAY WITH MEALS 10/08/22  Yes Merita Norton T, FNP  pantoprazole (PROTONIX) 40 MG tablet TAKE 1 TABLET DAILY 08/25/22  Yes Bacigalupo, Marzella Schlein, MD  pregabalin (LYRICA) 150 MG  capsule Take 150 mg by mouth 2 (two) times daily. 09/29/22  Yes [provider]  torsemide (DEMADEX) 20 MG tablet Take 2 tablets (40 mg total) by mouth every other day. Patient taking differently: Take 40 mg by mouth daily. 10/05/22  Yes Antonieta Iba, MD  acetaminophen (TYLENOL) 500 MG tablet Take 1,000 mg by mouth every 8 (eight) hours as needed for mild pain or moderate pain. Takes once a day    [provider]  Blood Glucose Monitoring Suppl (FREESTYLE LITE) DEVI To check blood sugar once daily 07/20/19   Margaretann Loveless, PA-C  Cholecalciferol (VITAMIN D3) 50 MCG (2000 UT) capsule Take 2,000 Units by mouth daily.    [provider]  clobetasol (TEMOVATE) 0.05 % external solution APPLY 1 APPLICATION TOPICALLY TWICE A DAY 08/18/22   Jacky Kindle, FNP  clobetasol ointment (TEMOVATE) 0.05 % Apply 1 Application topically 2 (two) times daily as needed. 09/30/22   Jacky Kindle, FNP  cyanocobalamin (VITAMIN B12) 1000 MCG tablet Take 1,000 mcg by mouth daily.    [provider]  cyclobenzaprine (FLEXERIL) 10 MG tablet Take 10 mg by mouth 3 (three) times daily.    [provider]  diazepam (VALIUM) 2 MG tablet Take 2 mg by mouth every 8 (eight) hours as needed. 07/28/22   [provider]  empagliflozin (JARDIANCE) 10 MG TABS tablet Take 1 tablet (10 mg total) by mouth daily before breakfast. 10/23/22   Delma Freeze, FNP  FREESTYLE LITE test strip USE TO CHECK BLOOD SUGAR ONCE DAILY 01/13/22   Jacky Kindle, FNP  indomethacin (INDOCIN) 50 MG capsule Take 50 mg by mouth daily. Taking once daily    [provider]  isosorbide mononitrate (IMDUR) 60 MG 24 hr tablet TAKE 1 TABLET DAILY 01/20/22   Antonieta Iba, MD  ketoconazole (NIZORAL) 2 % shampoo APPLY 1 APPLICATION TOPICALLY 2 TIMES WEEKLY AS DIRECTED 08/04/22   Jacky Kindle, FNP  Lancets (FREESTYLE) lancets USE TO CHECK BLOOD SUGAR ONCE DAILY 12/23/20   Erasmo Downer, MD   Multiple Vitamin (MULTIVITAMIN WITH MINERALS) TABS tablet Take 1 tablet by mouth daily.    [provider]  nitroGLYCERIN (NITROSTAT) 0.4 MG SL tablet Place 1 tablet (0.4 mg total) under the tongue every 5 (five) minutes as needed for chest pain. 02/10/22   Delma Freeze, FNP  OZEMPIC, 0.25 OR 0.5 MG/DOSE, 2 MG/3ML SOPN INJECT 0.5 MG UNDER THE SKIN ONCE A WEEK 10/20/22   Jacky Kindle, FNP  potassium chloride (KLOR-CON) 10 MEQ tablet TAKE 2 TABLETS TWICE A DAY 07/24/22   Jacky Kindle, FNP  sacubitril-valsartan (ENTRESTO) 24-26 MG Take 1 tablet by mouth 2 (two) times daily.  [provider]  senna (SENOKOT) 8.6 MG TABS tablet Take 1 tablet by mouth daily.    [provider]  Sod Picosulfate-Mag Ox-Cit Acd (CLENPIQ) 10-3.5-12 MG-GM -GM/160ML SOLN Take 1 bottle at 5 PM followed by five 8 oz cups of water and repeat 5 hours before procedure. 10/07/22   [name], MD    Allergies as of 10/06/2022 - Review Complete 10/02/2022  Allergen Reaction Noted   Colchicine Other (See Comments) 07/17/2019   Lisinopril Cough 12/02/2017    Family History  Problem Relation Age of Onset   Arthritis Mother    Hyperlipidemia Father    Hypertension Father    Heart attack Father 38   Heart murmur Brother    Valvular heart disease Brother    Cataracts Maternal Grandmother    Glaucoma Maternal Grandmother    Cancer Maternal Grandfather    Heart attack Paternal Grandfather    Hypertension Paternal Grandfather    Prostate cancer Neg Hx    Bladder Cancer Neg Hx    Kidney cancer Neg Hx     Social History   Socioeconomic History   Marital status: Divorced    Spouse name: Not on file   Number of children: Not on file   Years of education: Not on file   Highest education level: Not on file  Occupational History   Not on file  Tobacco Use   Smoking status: Former    Packs/day: 0.25    Years: 2.00    Additional pack years: 0.00    Total pack years: 0.50    Types:  Cigarettes    Quit date: 05/26/2005    Years since quitting: 17.4   Smokeless tobacco: Former    Types: Associate Professor Use: Never used  Substance and Sexual Activity   Alcohol use: No   Drug use: No   Sexual activity: Not on file  Other Topics Concern   Not on file  Social History Narrative   Lives at home with [name] (friend)    Social Determinants of Health   Financial Resource Strain: Medium Risk (12/25/2020)   Overall Financial Resource Strain (CARDIA)    Difficulty of Paying Living Expenses: Somewhat hard  Food Insecurity: Not on file  Transportation Needs: No Transportation Needs (07/30/2020)   PRAPARE - Administrator, Civil Service (Medical): No    Lack of Transportation (Non-Medical): No  Physical Activity: Not on file  Stress: Not on file  Social Connections: Not on file  Intimate Partner Violence: Not on file    Review of Systems: See HPI, otherwise negative ROS  Physical Exam: BP 136/79   Pulse 68   Temp (!) 96.2 F (35.7 C) (Temporal)   Resp 18   Ht 5\' 7"  (1.702 m)   Wt 95.4 kg   SpO2 97%   BMI 32.95 kg/m  General:   Alert,  pleasant and cooperative in NAD Head:  Normocephalic and atraumatic. Neck:  Supple; no masses or thyromegaly. Lungs:  Clear throughout to auscultation, normal respiratory effort.    Heart:  +S1, +S2, Regular rate and rhythm, No edema. Abdomen:  Soft, nontender and nondistended. Normal bowel sounds, without guarding, and without rebound.   Neurologic:  Alert and  oriented x4;  grossly normal neurologically.  Impression/Plan: Jonathon Newman is here for an colonoscopy to be performed for Screening colonoscopy average risk   Risks, benefits, limitations, and alternatives regarding  colonoscopy have been reviewed with the patient.  Questions  have been answered.  All parties agreeable.   [name], MD  10/28/2022, 8:20 AM

## 2022-10-28 NOTE — Anesthesia Procedure Notes (Signed)
Date/Time: 10/28/2022 8:34 AM  Performed by: Ginger Carne, CRNAPre-anesthesia Checklist: Patient identified, Emergency Drugs available, Suction available, Patient being monitored and Timeout performed Patient Re-evaluated:Patient Re-evaluated prior to induction Oxygen Delivery Method: Nasal cannula Preoxygenation: Pre-oxygenation with 100% oxygen Induction Type: IV induction

## 2022-10-28 NOTE — Op Note (Signed)
[facility] Gastroenterology Patient Name: Jonathon Newman Procedure Date: 10/28/2022 8:34 AM MRN: [id number] Account #: [id number] Date of Birth: 1955/08/06 Admit Type: Outpatient Age: 67 Room: [facility] ENDO ROOM 3 Gender: Male Note Status: Finalized Instrument Name: Nelda Marseille [id number] Procedure:             Colonoscopy Indications:           Screening for colorectal malignant neoplasm Providers:             [name] MD, MD Medicines:             Monitored Anesthesia Care Complications:         No immediate complications. Procedure:             Pre-Anesthesia Assessment:                        - Prior to the procedure, a History and Physical was                         performed, and patient medications, allergies and                         sensitivities were reviewed. [name] tolerance                         of previous anesthesia was reviewed.                        - The risks and benefits of the procedure and the                         sedation options and risks were discussed with the                         patient. All questions were answered and informed                         consent was obtained.                        - ASA Grade Assessment: II - A patient with mild                         systemic disease.                        After obtaining informed consent, the colonoscope was                         passed under direct vision. Throughout the procedure,                         the patient's blood pressure, pulse, and oxygen                         saturations were monitored continuously. The                         Colonoscope was introduced through the anus and  advanced to the the cecum, identified by the                         appendiceal orifice. The colonoscopy was performed                         with ease. The patient tolerated the procedure well.                         The quality of the bowel preparation  was good. The                         ileocecal valve, appendiceal orifice, and rectum were                         photographed. Findings:      The perianal and digital rectal examinations were normal.      The entire examined colon appeared normal on direct and retroflexion       views. Impression:            - The entire examined colon is normal on direct and                         retroflexion views.                        - No specimens collected. Recommendation:        - Discharge patient to home (with escort).                        - Resume previous diet.                        - Continue present medications.                        - Repeat colonoscopy in 10 years for screening                         purposes. Procedure Code(s):     --- Professional ---                        [phone], Colonoscopy, flexible; diagnostic, including                         collection of specimen(s) by brushing or washing, when                         performed (separate procedure) Diagnosis Code(s):     --- Professional ---                        Z12.11, Encounter for screening for malignant neoplasm                         of colon CPT copyright 2022 [employer]. All rights reserved. The codes documented in this report are preliminary and upon coder review may  be revised to meet current compliance requirements. [name], MD [name] MD, MD 10/28/2022 8:50:46 AM This report has  been signed electronically. Number of Addenda: 0 Note Initiated On: 10/28/2022 8:34 AM Scope Withdrawal Time: 0 hours 9 minutes 3 seconds  Total Procedure Duration: 0 hours 11 minutes 41 seconds  Estimated Blood Loss:  Estimated blood loss: none.      [facility]

## 2022-10-28 NOTE — Transfer of Care (Signed)
Immediate Anesthesia Transfer of Care Note  Patient: Jonathon Newman  Procedure(s) Performed: COLONOSCOPY WITH PROPOFOL  Patient Location: Endoscopy Unit  Anesthesia Type:General  Level of Consciousness: sedated  Airway & Oxygen Therapy: Patient Spontanous Breathing  Post-op Assessment: Report given to RN and Post -op Vital signs reviewed and stable  Post vital signs: Reviewed and stable  Last Vitals:  Vitals Value Taken Time  BP 124/64 10/28/22 0854  Temp    Pulse 65 10/28/22 0854  Resp 9 10/28/22 0854  SpO2 92 % 10/28/22 0854    Last Pain:  Vitals:   10/28/22 0746  TempSrc: Temporal  PainSc: 7          Complications: No notable events documented.

## 2022-10-28 NOTE — Anesthesia Postprocedure Evaluation (Signed)
Anesthesia Post Note  Patient: Jonathon Newman  Procedure(s) Performed: COLONOSCOPY WITH PROPOFOL  Patient location during evaluation: Endoscopy Anesthesia Type: General Level of consciousness: awake and alert Pain management: pain level controlled Vital Signs Assessment: post-procedure vital signs reviewed and stable Respiratory status: spontaneous breathing, nonlabored ventilation and respiratory function stable Cardiovascular status: blood pressure returned to baseline and stable Postop Assessment: no apparent nausea or vomiting Anesthetic complications: no   No notable events documented.   Last Vitals:  Vitals:   10/28/22 0854 10/28/22 0903  BP: 124/64 121/73  Pulse: 65   Resp: (!) 9   Temp:    SpO2: 92%     Last Pain:  Vitals:   10/28/22 0913  TempSrc:   PainSc: 0-No pain                 Foye Deer

## 2022-10-29 ENCOUNTER — Encounter: Payer: Self-pay | Admitting: Gastroenterology

## 2022-10-30 ENCOUNTER — Ambulatory Visit: Payer: TRICARE For Life (TFL) | Attending: Family | Admitting: Family

## 2022-10-30 ENCOUNTER — Encounter: Payer: Self-pay | Admitting: Family

## 2022-10-30 VITALS — BP 92/56 | HR 68 | Wt 219.8 lb

## 2022-10-30 DIAGNOSIS — Z79899 Other long term (current) drug therapy: Secondary | ICD-10-CM | POA: Insufficient documentation

## 2022-10-30 DIAGNOSIS — I2510 Atherosclerotic heart disease of native coronary artery without angina pectoris: Secondary | ICD-10-CM | POA: Diagnosis not present

## 2022-10-30 DIAGNOSIS — I890 Lymphedema, not elsewhere classified: Secondary | ICD-10-CM | POA: Diagnosis not present

## 2022-10-30 DIAGNOSIS — I5030 Unspecified diastolic (congestive) heart failure: Secondary | ICD-10-CM | POA: Diagnosis not present

## 2022-10-30 DIAGNOSIS — R682 Dry mouth, unspecified: Secondary | ICD-10-CM | POA: Insufficient documentation

## 2022-10-30 DIAGNOSIS — I10 Essential (primary) hypertension: Secondary | ICD-10-CM | POA: Diagnosis not present

## 2022-10-30 DIAGNOSIS — Z951 Presence of aortocoronary bypass graft: Secondary | ICD-10-CM | POA: Diagnosis not present

## 2022-10-30 DIAGNOSIS — G4733 Obstructive sleep apnea (adult) (pediatric): Secondary | ICD-10-CM | POA: Diagnosis not present

## 2022-10-30 DIAGNOSIS — I110 Hypertensive heart disease with heart failure: Secondary | ICD-10-CM | POA: Insufficient documentation

## 2022-10-30 DIAGNOSIS — N400 Enlarged prostate without lower urinary tract symptoms: Secondary | ICD-10-CM | POA: Diagnosis not present

## 2022-10-30 DIAGNOSIS — G8929 Other chronic pain: Secondary | ICD-10-CM | POA: Diagnosis not present

## 2022-10-30 DIAGNOSIS — Z87891 Personal history of nicotine dependence: Secondary | ICD-10-CM | POA: Diagnosis not present

## 2022-10-30 DIAGNOSIS — E119 Type 2 diabetes mellitus without complications: Secondary | ICD-10-CM | POA: Insufficient documentation

## 2022-10-30 DIAGNOSIS — I5032 Chronic diastolic (congestive) heart failure: Secondary | ICD-10-CM

## 2022-10-30 DIAGNOSIS — I25118 Atherosclerotic heart disease of native coronary artery with other forms of angina pectoris: Secondary | ICD-10-CM

## 2022-10-30 DIAGNOSIS — G4730 Sleep apnea, unspecified: Secondary | ICD-10-CM | POA: Diagnosis not present

## 2022-10-30 DIAGNOSIS — K219 Gastro-esophageal reflux disease without esophagitis: Secondary | ICD-10-CM | POA: Diagnosis not present

## 2022-10-30 DIAGNOSIS — Z955 Presence of coronary angioplasty implant and graft: Secondary | ICD-10-CM | POA: Diagnosis not present

## 2022-10-30 DIAGNOSIS — E1151 Type 2 diabetes mellitus with diabetic peripheral angiopathy without gangrene: Secondary | ICD-10-CM

## 2022-10-30 NOTE — Progress Notes (Signed)
PCP: Jacky Kindle, FNP (last seen 04/24) Primary Cardiologist: Julien Nordmann, MD (last seen 01/24)  HPI:  Jonathon Newman is a 67 y/o male with a history of CAD, DM, hyperlipidemia, HTN, OSA, BPH, GERD, previous tobacco use, chronic back pain with spinal stimulator and chronic heart failure.   Echo 12/11/21: EF of 55-60%  LHC 05/02/18:  The left ventricular systolic function is normal. LV end diastolic pressure is mildly elevated. The left ventricular ejection fraction is 55-65% by visual estimate. Prox RCA to Mid RCA lesion is 10% stenosed. Prox RCA lesion is 30% stenosed. Dist RCA lesion is 20% stenosed. SVG graft was visualized by angiography. The graft exhibits mild diffuse disease. Ost Cx to Prox Cx lesion is 95% stenosed. Mid LM to Dist LM lesion is 80% stenosed. LIMA and is normal in caliber. The graft exhibits no disease.  1. Significant underlying left main and RCA disease with patent grafts including LIMA to LAD and SVG to OM 2.  Patent RCA stent with minimal restenosis. 2.  Normal LV systolic function.  Mildly elevated left ventricular end-diastolic pressure at 14 to 15 mmHg.  Has not been admitted or been in the ED in the last 6 months.   He presents today for a HF visit with a chief complaint of moderate fatigue with little exertion. Chronic in nature. Has associated SOB, intermittent pedal edema, dizziness (has been taking valium for last 6 weeks for this) and chronic neck/ back pain along with this.   Renal function improving. Had carvedilol decreased to 12.5mg  BID at recent nephrology appt due to hypotension (99/65) although he has been taking 25mg  just once daily. Has had a colonoscopy since last here and was normal.   Continues to drink too much fluids but says that his mouth stays so dry. Admits that he can drink up to 120 ounces daily if his mouth feels dry. Is aware that is more fluids than he should be drinking. Using NoSalt for seasoning although does occasionally  use regular salt on foods like watermelon & cantaloupe.  ROS: All systems negative except as listed in HPI, PMH and Problem List.  SH:  Social History   Socioeconomic History   Marital status: Divorced    Spouse name: Not on file   Number of children: Not on file   Years of education: Not on file   Highest education level: Not on file  Occupational History   Not on file  Tobacco Use   Smoking status: Former    Packs/day: 0.25    Years: 2.00    Additional pack years: 0.00    Total pack years: 0.50    Types: Cigarettes    Quit date: 05/26/2005    Years since quitting: 17.4   Smokeless tobacco: Former    Types: Associate Professor Use: Never used  Substance and Sexual Activity   Alcohol use: No   Drug use: No   Sexual activity: Not on file  Other Topics Concern   Not on file  Social History Narrative   Lives at home with Jonathon Newman (friend)    Social Determinants of Health   Financial Resource Strain: Medium Risk (12/25/2020)   Overall Financial Resource Strain (CARDIA)    Difficulty of Paying Living Expenses: Somewhat hard  Food Insecurity: Not on file  Transportation Needs: No Transportation Needs (07/30/2020)   PRAPARE - Administrator, Civil Service (Medical): No    Lack of Transportation (Non-Medical): No  Physical  Activity: Not on file  Stress: Not on file  Social Connections: Not on file  Intimate Partner Violence: Not on file    FH:  Family History  Problem Relation Age of Onset   Arthritis Mother    Hyperlipidemia Father    Hypertension Father    Heart attack Father 60   Heart murmur Brother    Valvular heart disease Brother    Cataracts Maternal Grandmother    Glaucoma Maternal Grandmother    Cancer Maternal Grandfather    Heart attack Paternal Grandfather    Hypertension Paternal Grandfather    Prostate cancer Neg Hx    Bladder Cancer Neg Hx    Kidney cancer Neg Hx     Past Medical History:  Diagnosis Date   Anginal pain  (HCC)    Arthritis    BPH (benign prostatic hyperplasia)    CHF (congestive heart failure) (HCC)    Coronary artery disease 2010   a.) LHC 2010: high grade RCA stenosis -> 2.5 x 26mm Cypher DES to RCA. b.) NSTEMI 2016 -> PCI revealed pat RCA stent; sig dLM Dz with FFR ratio 0.76 and oLCx stenosis; ref to CVTS. c.) 2v CABG 04/21/2015; LIMA-LAD, SVG-OM2. d.) [facility] 05/02/2018: EF 55-65%; LVEDP 14-15 mmHg; 10% p-m RCA, 30% pRCA, 20% dRCA, 95% o-pLCx, 80% m-dLM; LIMA graft pat. SVG graft with mild diffuse Dz.   DDD (degenerative disc disease), lumbar    Dyspnea    GERD (gastroesophageal reflux disease)    Hypercholesteremia    Hypertension    Left foot drop    NSTEMI (non-ST elevated myocardial infarction) (HCC) 2016   a.) PCI revealed patent RCA stent; significant dLM disease with FFR ratio 0.76 and oLCx stenosis; refer to CVTS for CABG.   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 Takes once a day     aspirin EC 81 MG tablet Take 81 mg by mouth daily. Swallow whole.     atorvastatin (LIPITOR) 40 MG tablet Take 1 tablet (40 mg total) by mouth daily. 90 tablet 3   Blood Glucose Monitoring Suppl (FREESTYLE LITE) DEVI To check blood sugar once daily 1 each 0   carvedilol (COREG) 25 MG tablet Take 1 tablet (25 mg total) by mouth 2 (two) times daily with a meal. 180 tablet 3   Cholecalciferol (VITAMIN D3) 50 MCG (2000 UT) capsule Take 2,000 Units by mouth daily.     clobetasol (TEMOVATE) 0.05 % external solution APPLY 1 APPLICATION TOPICALLY TWICE A DAY 100 mL 6   clobetasol ointment (TEMOVATE) 0.05 % Apply 1 Application topically 2 (two) times daily as needed. 60 g 5   cyanocobalamin (VITAMIN B12) 1000 MCG  tablet Take 1,000 mcg by mouth daily.     cyclobenzaprine (FLEXERIL) 10 MG tablet TAKE 1 TABLET THREE TIMES A DAY AS NEEDED FOR MUSCLE SPASMS 90 tablet 11   diazepam (VALIUM) 2 MG tablet Take 2 mg by mouth every 8 (eight) hours as needed.     empagliflozin (JARDIANCE) 10 MG TABS tablet Take 1 tablet (10 mg total) by mouth daily before breakfast. 30 tablet 5   ezetimibe (ZETIA) 10 MG tablet Take 1  tablet (10 mg total) by mouth daily. 90 tablet 1   FREESTYLE LITE test strip USE TO CHECK BLOOD SUGAR ONCE DAILY 100 strip 3   indomethacin (INDOCIN) 50 MG capsule Take 50 mg by mouth daily. Taking once daily     isosorbide mononitrate (IMDUR) 60 MG 24 hr tablet TAKE 1 TABLET DAILY 90 tablet 3   ketoconazole (NIZORAL) 2 % shampoo APPLY 1 APPLICATION TOPICALLY 2 TIMES WEEKLY AS DIRECTED 120 mL 3   Lancets (FREESTYLE) lancets USE TO CHECK BLOOD SUGAR ONCE DAILY 100 each 4   metFORMIN (GLUCOPHAGE-XR) 500 MG 24 hr tablet TAKE 2 TABLETS TWICE A DAY WITH MEALS 360 tablet 0   Multiple Vitamin (MULTIVITAMIN WITH MINERALS) TABS tablet Take 1 tablet by mouth daily.     nitroGLYCERIN (NITROSTAT) 0.4 MG SL tablet Place 1 tablet (0.4 mg total) under the tongue every 5 (five) minutes as needed for chest pain. 25 tablet 3   OZEMPIC, 0.25 OR 0.5 MG/DOSE, 2 MG/3ML SOPN INJECT 0.5 MG UNDER THE SKIN ONCE A WEEK 9 mL 3   pantoprazole (PROTONIX) 40 MG tablet TAKE 1 TABLET DAILY 90 tablet 3   potassium chloride (KLOR-CON) 10 MEQ tablet TAKE 2 TABLETS TWICE A DAY 360 tablet 1   pregabalin (LYRICA) 150 MG capsule Take 150 mg by mouth 2 (two) times daily.     sacubitril-valsartan (ENTRESTO) 24-26 MG Take 1 tablet by mouth 2 (two) times daily.     senna (SENOKOT) 8.6 MG TABS tablet Take 1 tablet by mouth daily.     Sod Picosulfate-Mag Ox-Cit Acd (CLENPIQ) 10-3.5-12 MG-GM -GM/160ML SOLN Take 1 bottle at 5 PM followed by five 8 oz cups of water and repeat 5 hours before procedure. 320 mL 0   torsemide (DEMADEX) 20 MG tablet Take 2  tablets (40 mg total) by mouth every other day. (Patient taking differently: Take 40 mg by mouth daily.)     No current facility-administered medications for this visit.   Vitals:   10/30/22 0824  BP: (!) 92/56  Pulse: 68  SpO2: 94%  Weight: 219 lb 12.8 oz (99.7 kg)   Wt Readings from Last 3 Encounters:  10/30/22 219 lb 12.8 oz (99.7 kg)  10/28/22 210 lb 6.4 oz (95.4 kg)  10/09/22 220 lb 4 oz (99.9 kg)   Lab Results  Component Value Date   CREATININE 1.97 (H) 09/30/2022   CREATININE 1.29 (H) 02/10/2022   CREATININE 1.14 01/09/2022   PHYSICAL EXAM:  General:  Well appearing. No resp difficulty HEENT: normal Neck: supple. JVP flat. No lymphadenopathy or thryomegaly appreciated. Cor: PMI normal. Regular rate & rhythm. No rubs, gallops or murmurs. Lungs: clear Abdomen: soft, nontender, nondistended. No hepatosplenomegaly. No bruits or masses.  Extremities: no cyanosis, clubbing, rash, edema Neuro: alert & oriented x3, cranial nerves grossly intact. Moves all 4 extremities w/o difficulty. Affect pleasant.  ECG: not done  ASSESSMENT & PLAN:  1: Ischemic heart failure with preserved ejection fraction- - Etiology: CAD w/ previous CABG - NYHA class III - euvolemic today - weighing daily; reminded to call for an overnight weight gain of > 2 pounds or a weekly weight gain of > 5 pounds - weight stable from last visit here 3 weeks ago - Echo 12/11/21: EF of 55-60% - has been taking carvedilol 25mg  daily instead of 12.5mg  BID; reviewed that he needed to take this as 12.5mg  BID - continue entresto 24/26mg  BID - continue torsemide 40mg  daily w/ extra 40mg  PRN/ potassium BID -  continue jardiance 10mg  daily - using NoSalt although will use regular salt on watermelon/ cantaloupe - knows to keep daily fluid intake to 64 ounces but says that he's drinking way more than that due to his dry mouth; says that he can drink up to 120 oz daily - encouraged to put compression socks on  -  BNP 01/06/22 was 73.0 - PharmD reconciled meds w/ patient  2: HTN- - BP 92/56; change carvedilol from 25mg  daily to 12.5mg  BID - if BP remains low, consider decreasing his imdur - BP log given and instructed him to check his BP a few times a week and bring log back to next visit - saw PCP Suzie Portela) 4/24 - saw nephrology Thedore Mins) 05/24 - BMP 10/06/22 reviewed and showed sodium 138, potassium 3.7, creatinine 1.52 and GFR 50  3: DM- - A1c 09/30/22 was 6.0%  4: CAD- - saw cardiology Mariah Milling) 01/24 - LHC 05/02/18:  The left ventricular systolic function is normal. LV end diastolic pressure is mildly elevated. The left ventricular ejection fraction is 55-65% by visual estimate. Prox RCA to Mid RCA lesion is 10% stenosed. Prox RCA lesion is 30% stenosed. Dist RCA lesion is 20% stenosed. SVG graft was visualized by angiography. The graft exhibits mild diffuse disease. Ost Cx to Prox Cx lesion is 95% stenosed. Mid LM to Dist LM lesion is 80% stenosed. LIMA and is normal in caliber. The graft exhibits no disease.  1. Significant underlying left main and RCA disease with patent grafts including LIMA to LAD and SVG to OM 2.  Patent RCA stent with minimal restenosis. 2.  Normal LV systolic function.  Mildly elevated left ventricular end-diastolic pressure at 14 to 15 mmHg.  5: Lymphedema- - saw vascular (Dew) 10/23 - wearing compression socks a few times a week with good results; encouraged to wear them daily  6: OSA- - has worn CPAP for >20 years  Return in 1 month, sooner if needed.

## 2022-10-30 NOTE — Progress Notes (Signed)
[facility] - Pharmacist Note  Jonathon Newman is a 67 y.o. male with HFpEF (EF >50%) presenting to the Heart Failure Clinic for follow up. He reports no medication access issues and no adverse drug events. He keeps a digital log of his daily weight on his phone. Per patient and weight log, his weight has been slowly downtrending lately. He hopes to continue to lose weight while on Ozempic and with adjustments to diet. He states that lately he has been consuming >120 oz of fluids due to increased thirst. He is aware of recommended restriction to 64 oz daily. Patient states that he would like to take fewer medicines but understands the need to continue with current regimen.  Recent ED Visit (past 6 months): none  Guideline-Directed Medical Therapy/Evidence Based Medicine ACE/ARB/ARNI: Sacubitril/valsartan 24/26 mg BID Beta Blocker: Carvedilol 25 mg daily Aldosterone Antagonist:  none Diuretic: Torsemide 40 mg daily + additional 20 mg daily as needed SGLT2i: Empagliflozin 10 mg daily  Adherence Assessment Do you ever forget to take your medication? [] Yes [x] No  Do you ever skip doses due to side effects? [] Yes [x] No  Do you have trouble affording your medicines? [] Yes [x] No  Are you ever unable to pick up your medication due to transportation difficulties? [] Yes [x] No  Do you ever stop taking your medications because you don't believe they are helping? [] Yes [x] No  Do you check your weight daily? [x] Yes (keeps digital log) [] No  Adherence strategy: pill box Barriers to obtaining medications: none reported  Diagnostics ECHO: Date 12/11/2021, EF 55-60%, no RWMA, G2DD  Vitals    10/30/2022    8:24 AM 10/28/2022    9:03 AM 10/28/2022    8:54 AM  Vitals with BMI  Weight 219 lbs 13 oz    BMI 34.42    Systolic 92 121 124  Diastolic 56 73 64  Pulse 68  65     Recent Labs    Latest Ref Rng & Units 09/30/2022   10:03 AM 02/10/2022    9:33 AM 01/09/2022   11:29 AM  BMP   Glucose 70 - 99 mg/dL 161  096  045   BUN 8 - 27 mg/dL 30  30  12    Creatinine 0.76 - 1.27 mg/dL 4.09  8.11  9.14   BUN/Creat Ratio 10 - 24 15   11    Sodium 134 - 144 mmol/L 139  141  141   Potassium 3.5 - 5.2 mmol/L 4.2  3.9  4.9   Chloride 96 - 106 mmol/L 102  104  109   CO2 20 - 29 mmol/L 20  25  19    Calcium 8.6 - 10.2 mg/dL 9.5  9.5  9.1     Past Medical History Past Medical History:  Diagnosis Date   Anginal pain (HCC)    Arthritis    BPH (benign prostatic hyperplasia)    CHF (congestive heart failure) (HCC)    Coronary artery disease 2010   a.) LHC 2010: high grade RCA stenosis -> 2.5 x 26mm Cypher DES to RCA. b.) NSTEMI 2016 -> PCI revealed pat RCA stent; sig dLM Dz with FFR ratio 0.76 and oLCx stenosis; ref to CVTS. c.) 2v CABG 04/21/2015; LIMA-LAD, SVG-OM2. d.) [facility] 05/02/2018: EF 55-65%; LVEDP 14-15 mmHg; 10% p-m RCA, 30% pRCA, 20% dRCA, 95% o-pLCx, 80% m-dLM; LIMA graft pat. SVG graft with mild diffuse Dz.   DDD (degenerative disc disease), lumbar    Dyspnea    GERD (gastroesophageal reflux disease)  Hypercholesteremia    Hypertension    Left foot drop    NSTEMI (non-ST elevated myocardial infarction) (HCC) 2016   a.) PCI revealed patent RCA stent; significant dLM disease with FFR ratio 0.76 and oLCx stenosis; refer to CVTS for CABG.   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

## 2022-10-30 NOTE — Patient Instructions (Signed)
You need to take your carvedilol as 1/2 tablet in the morning and 1/2 tablet in the evening

## 2022-11-02 ENCOUNTER — Telehealth: Payer: Self-pay

## 2022-11-02 NOTE — Telephone Encounter (Signed)
Care Management & Coordination Services Outreach Note  11/02/2022 Name: Jonathon Newman MRN: [id number] DOB: 02-Feb-1956  Referred by: Jacky Kindle, FNP  Patient had a phone appointment scheduled with clinical pharmacist today.  An unsuccessful telephone outreach was attempted today. The patient was referred to the pharmacist for assistance with medications, care management and care coordination.   Patient will NOT be penalized in any way for missing a Care Management & Coordination Services appointment. The no-show fee does not apply.  Angelena Sole, PharmD, Patsy Baltimore, CPP  Clinical Pharmacist Practitioner  [facility] [phone]

## 2022-11-02 NOTE — Progress Notes (Unsigned)
Care Management & Coordination Services Pharmacy Note  11/02/2022 Name:  Jonathon Newman MRN:  [id number] DOB:  1956-04-17  Summary: ***  Recommendations/Changes made from today's visit: ***  Follow up plan: ***   Subjective: Jonathon Newman is an 67 y.o. year old male who is a primary patient of Jacky Kindle, [place].  The care coordination team was consulted for assistance with disease management and care coordination needs.    {CCMTELEPHONEFACETOFACE:21091510} for {CCMINITIALFOLLOWUPCHOICE:21091511}.  Recent office visits: 09/30/22: Patient presented to Merita Norton, FNP for AWV.   Recent consult visits: 10/30/22: Patient presented to Clarisa Kindred, FNP (Cardiology) for follow-up. BP 92/56.  10/19/22: Patient presented to Dr. Thedore Mins (Nephrology) for follow-up. Carvedilol 12.5 mg twice daily.  10/05/22: Patient presented to Dr. Tobi Bastos (GI) for follow-up.  Hospital visits: {Hospital DC Yes/No:25215}   Objective:  Lab Results  Component Value Date   CREATININE 1.97 (H) 09/30/2022   BUN 30 (H) 09/30/2022   EGFR 37 (L) 09/30/2022   GFRNONAA >60 02/10/2022   GFRAA 72 06/28/2020   NA 139 09/30/2022   K 4.2 09/30/2022   CALCIUM 9.5 09/30/2022   CO2 20 09/30/2022   GLUCOSE 126 (H) 09/30/2022    Lab Results  Component Value Date/Time   HGBA1C 6.0 (H) 09/30/2022 10:03 AM   HGBA1C 6.0 (H) 11/06/2021 09:48 AM   MICROALBUR 50 07/17/2019 07:26 PM    Last diabetic Eye exam: No results found for: "HMDIABEYEEXA"  Last diabetic Foot exam: No results found for: "HMDIABFOOTEX"   Lab Results  Component Value Date   CHOL 130 09/30/2022   HDL 32 (L) 09/30/2022   LDLCALC 63 09/30/2022   LDLDIRECT 34.8 11/06/2021   TRIG 212 (H) 09/30/2022   CHOLHDL 4.1 09/30/2022       Latest Ref Rng & Units 09/30/2022   10:03 AM 01/09/2022   11:29 AM 01/06/2022    8:42 PM  Hepatic Function  Total Protein 6.0 - 8.5 g/dL 7.4  6.6  7.3   Albumin 3.9 - 4.9 g/dL 4.5  4.3  4.2   AST 0 - 40 IU/L 25  44   40   ALT 0 - 44 IU/L 34  78  60   Alk Phosphatase 44 - 121 IU/L 89  88  76   Total Bilirubin 0.0 - 1.2 mg/dL 0.6  0.4  0.9     Lab Results  Component Value Date/Time   TSH 2.430 09/30/2022 10:03 AM   TSH 1.600 02/02/2018 04:19 PM   FREET4 1.05 09/30/2022 10:03 AM       Latest Ref Rng & Units 09/30/2022   10:03 AM 01/06/2022    8:42 PM 05/12/2021   12:47 PM  CBC  WBC 3.4 - 10.8 x10E3/uL 5.4  6.0  5.8   Hemoglobin 13.0 - 17.7 g/dL 40.9  81.1  91.4   Hematocrit 37.5 - 51.0 % 41.1  34.6  36.9   Platelets 150 - 450 x10E3/uL 156  161  176     Lab Results  Component Value Date/Time   VD25OH 34.1 09/30/2022 10:03 AM   VITAMINB12 595 09/30/2022 10:03 AM    Clinical ASCVD: {YES/NO:21197} The 10-year ASCVD risk score (Arnett DK, et al., 2019) is: 16.2%   Values used to calculate the score:     Age: 63 years     Sex: Male     Is Non-Hispanic African American: No     Diabetic: Yes     Tobacco smoker: No     Systolic Blood  Pressure: 92 mmHg     Is BP treated: Yes     HDL Cholesterol: 32 mg/dL     Total Cholesterol: 130 mg/dL    ***Other: ([id number] if Afib, MMRC or CAT for COPD, ACT, DEXA)     09/30/2022    9:35 AM 02/10/2022    9:04 AM 01/13/2022    9:44 AM  Depression screen PHQ 2/9  Decreased Interest 0 0 0  Down, Depressed, Hopeless 0 0 0  PHQ - 2 Score 0 0 0  Altered sleeping 0    Tired, decreased energy 2    Change in appetite 0    Feeling bad or failure about yourself  0    Trouble concentrating 0    Moving slowly or fidgety/restless 0    Suicidal thoughts 0    PHQ-9 Score 2    Difficult doing work/chores Not difficult at all       Social History   Tobacco Use  Smoking Status Former   Packs/day: 0.25   Years: 2.00   Additional pack years: 0.00   Total pack years: 0.50   Types: Cigarettes   Quit date: 05/26/2005   Years since quitting: 17.4  Smokeless Tobacco Former   Types: Chew   BP Readings from Last 3 Encounters:  10/30/22 (!) 92/56  10/28/22  121/73  10/09/22 114/76   Pulse Readings from Last 3 Encounters:  10/30/22 68  10/28/22 65  10/09/22 81   Wt Readings from Last 3 Encounters:  10/30/22 219 lb 12.8 oz (99.7 kg)  10/28/22 210 lb 6.4 oz (95.4 kg)  10/09/22 220 lb 4 oz (99.9 kg)   BMI Readings from Last 3 Encounters:  10/30/22 34.43 kg/m  10/28/22 32.95 kg/m  10/09/22 34.50 kg/m    Allergies  Allergen Reactions   Colchicine Other (See Comments)    Palpitations and headache   Lisinopril Cough    Medications Reviewed Today     Reviewed by Delma Freeze, FNP (Family Nurse Practitioner) on 10/30/22 at 0911  Med List Status: <None>   Medication Order Taking? Sig Documenting Provider Last Dose Status Informant  acetaminophen (TYLENOL) 500 MG tablet [id number] Yes Take 1,000 mg by mouth every 8 (eight) hours as needed for mild pain or moderate pain. Takes once a day [provider] Taking Active   aspirin EC 81 MG tablet [id number] Yes Take 81 mg by mouth daily. Swallow whole. [provider] Taking Active   atorvastatin (LIPITOR) 40 MG tablet [id number] Yes Take 1 tablet (40 mg total) by mouth daily. Antonieta Iba, MD Taking Active   bisacodyl (DULCOLAX) 5 MG EC tablet [id number] Yes Take 5 mg by mouth daily as needed for moderate constipation. [provider] Taking Active   Blood Glucose Monitoring Suppl (FREESTYLE LITE) DEVI [id number]  To check blood sugar once daily Margaretann Loveless, [place]  Active Self  carvedilol (COREG) 25 MG tablet [id number] Yes Take 1 tablet (25 mg total) by mouth 2 (two) times daily with a meal.  Patient taking differently: Take 25 mg by mouth daily.   Antonieta Iba, MD Taking Active   Cholecalciferol (VITAMIN D3) 50 MCG (2000 UT) capsule [id number] Yes Take 2,000 Units by mouth daily. [provider] Taking Active   clobetasol (TEMOVATE) 0.05 % external solution [id number] Yes APPLY 1 APPLICATION TOPICALLY TWICE A DAY Jacky Kindle, FNP Taking  Active   clobetasol ointment (TEMOVATE) 0.05 % [id number] Yes Apply 1 Application topically 2 (two) times daily as  needed. Jacky Kindle, FNP Taking Active   cyanocobalamin (VITAMIN B12) 1000 MCG tablet [id number] Yes Take 1,000 mcg by mouth daily. [provider] Taking Active   cyclobenzaprine (FLEXERIL) 10 MG tablet [id number] Yes TAKE 1 TABLET THREE TIMES A DAY AS NEEDED FOR MUSCLE SPASMS  Patient taking differently: Take 10 mg by mouth 2 (two) times daily.   Jacky Kindle, FNP Taking Active   diazepam (VALIUM) 2 MG tablet [id number] Yes Take 2 mg by mouth every 8 (eight) hours as needed. [provider] Taking Active   empagliflozin (JARDIANCE) 10 MG TABS tablet [id number] Yes Take 1 tablet (10 mg total) by mouth daily before breakfast. Delma Freeze, FNP Taking Active   ezetimibe (ZETIA) 10 MG tablet [id number] Yes Take 1 tablet (10 mg total) by mouth daily. Delma Freeze, FNP Taking Active   FREESTYLE LITE test strip [id number]  USE TO CHECK BLOOD SUGAR ONCE DAILY Jacky Kindle, FNP  Active   indomethacin (INDOCIN) 50 MG capsule [id number] Yes Take 50 mg by mouth daily as needed (gout flare). Taking once daily [provider] Taking Active Self  isosorbide mononitrate (IMDUR) 60 MG 24 hr tablet [id number] Yes TAKE 1 TABLET DAILY Gollan, Tollie Pizza, MD Taking Active   ketoconazole (NIZORAL) 2 % shampoo [id number] Yes APPLY 1 APPLICATION TOPICALLY 2 TIMES WEEKLY AS DIRECTED Jacky Kindle, FNP Taking Active   Lancets (FREESTYLE) lancets [id number]  USE TO CHECK BLOOD SUGAR ONCE DAILY Erasmo Downer, MD  Active Self  metFORMIN (GLUCOPHAGE-XR) 500 MG 24 hr tablet [id number] Yes TAKE 2 TABLETS TWICE A DAY WITH MEALS Jacky Kindle, FNP Taking Active   Multiple Vitamin (MULTIVITAMIN WITH MINERALS) TABS tablet [id number] Yes Take 1 tablet by mouth daily. [provider] Taking Active Self  nitroGLYCERIN (NITROSTAT) 0.4 MG SL tablet [id number] Yes Place 1 tablet (0.4  mg total) under the tongue every 5 (five) minutes as needed for chest pain. Clarisa Kindred A, FNP Taking Active   OZEMPIC, 0.25 OR 0.5 MG/DOSE, 2 MG/3ML SOPN [id number] Yes INJECT 0.5 MG UNDER THE SKIN ONCE A WEEK  Patient taking differently: Inject 0.5 mg into the skin once a week. Sundays   Merita Norton T, FNP Taking Active   pantoprazole (PROTONIX) 40 MG tablet [id number] Yes TAKE 1 TABLET DAILY Beryle Flock Marzella Schlein, MD Taking Active   potassium chloride (KLOR-CON) 10 MEQ tablet [id number] Yes TAKE 2 TABLETS TWICE A DAY Jacky Kindle, FNP Taking Active   pregabalin (LYRICA) 150 MG capsule [id number] Yes Take 150 mg by mouth 2 (two) times daily. [provider] Taking Active   sacubitril-valsartan (ENTRESTO) 24-26 MG [id number] Yes Take 1 tablet by mouth 2 (two) times daily. [provider] Taking Active   torsemide (DEMADEX) 20 MG tablet [id number] Yes Take 2 tablets (40 mg total) by mouth every other day.  Patient taking differently: Take 40 mg by mouth daily. Two tablets once daily plus one extra tablet daily as needed for swelling   Mariah Milling, Tollie Pizza, MD Taking Active             SDOH:  (Social Determinants of Health) assessments and interventions performed: {yes/no:20286} SDOH Interventions    Flowsheet Row Chronic Care Management from 12/03/2020 in [facility] Chronic Care Management from 10/28/2020 in [facility] Chronic Care Management from 07/29/2020 in [facility]  SDOH Interventions     Transportation Interventions -- -- Intervention Not Indicated  Financial Strain Interventions Intervention Not Indicated Intervention Not Indicated Other (Comment)  [PAP]       Medication Assistance: {MEDASSISTANCEINFO:25044}  Medication Access: Within the past 30 days, how often has patient missed a dose of medication? *** Is a pillbox or other method used to improve adherence?  {YES/NO:21197} Factors that may affect medication adherence? {CHL DESC; BARRIERS:21522} Are meds synced by current pharmacy? {YES/NO:21197} Are meds delivered by current pharmacy? {YES/NO:21197} Does patient experience delays in picking up medications due to transportation concerns? {YES/NO:21197}  Compliance/Adherence/Medication fill history: Care Gaps: ***  Star-Rating Drugs: ***   Assessment/Plan  Heart Failure (Goal: control symptoms and prevent exacerbations) Uncontrolled Type: Diastolic -NYHA Class: III (marked limitation of activity) -Ejection fraction: 55-60% (Date: Jun 2023) -Current treatment: Carvedilol 12.5 mg twice daily  Imdur 60 mg daily  Entresto 24-26 mg twice daily  Jardiance 10 mg daily  Torsemide 40 mg every other day.  -Medications previously tried: Amlodipine (Swelling), Hydralazine, Metolazone (AKI), Spironolactone, Losartan  -Home blood pressure: 140/70, 152/78  -Weight stable 228-232  -Still has dizziness at baseline.  -Continue current medications    Hyperlipidemia: (LDL goal < 70) -Controlled -Current treatment: Atorvastatin 40 mg daily Zetia 10 mg daily  -Medications previously tried: NA  -Educated on Importance of limiting foods high in cholesterol; -Recommended to continue current medication  Diabetes (A1c goal <7%) -Controlled -Current medications: Metformin ER 500 mg 2 tablets twice daily Jardiance 10 mg daily   Ozempic 0.5 mg weekly  -Medications previously tried: NA   -Current home glucose readings fasting glucose:   -Reports hyperglycemic symptoms: dry mouth, fatigue   Chronic Kidney Disease Stage 3b  -All medications assessed for renal dosing and appropriateness in chronic kidney disease. -Recommended to continue current medication     Follow Up Plan: {CM FOLLOW UP PLAN:22241}    ***

## 2022-11-11 ENCOUNTER — Other Ambulatory Visit: Payer: Self-pay | Admitting: Family Medicine

## 2022-11-17 DIAGNOSIS — M21372 Foot drop, left foot: Secondary | ICD-10-CM | POA: Diagnosis not present

## 2022-11-20 ENCOUNTER — Encounter: Payer: Self-pay | Admitting: Family Medicine

## 2022-11-20 ENCOUNTER — Ambulatory Visit (INDEPENDENT_AMBULATORY_CARE_PROVIDER_SITE_OTHER): Payer: Medicare PPO | Admitting: Podiatry

## 2022-11-20 DIAGNOSIS — M2042 Other hammer toe(s) (acquired), left foot: Secondary | ICD-10-CM

## 2022-11-20 DIAGNOSIS — M2041 Other hammer toe(s) (acquired), right foot: Secondary | ICD-10-CM

## 2022-11-20 NOTE — Progress Notes (Signed)
Patient presents today to measured for  diabetic shoes and insoles.  Patient was measured for 1 pair of diabetic shoes and 3 pairs of foam casted diabetic insoles.   Wt 208 Shoe size  9.5 xw Shoe type lt477m 2nd a6065m 3rd 491  Treating Physician  : payne, elise   Re-appointment for regularly scheduled diabetic foot care visits or if they should experience any trouble with the shoes or insoles.

## 2022-11-24 ENCOUNTER — Encounter: Payer: Self-pay | Admitting: Family Medicine

## 2022-11-24 DIAGNOSIS — G4733 Obstructive sleep apnea (adult) (pediatric): Secondary | ICD-10-CM | POA: Diagnosis not present

## 2022-11-25 ENCOUNTER — Other Ambulatory Visit: Payer: Self-pay | Admitting: Family Medicine

## 2022-11-25 MED ORDER — CLOBETASOL PROPIONATE 0.05 % EX SOLN: 1.0000 | Freq: Two times a day (BID) | CUTANEOUS | 11 refills | Status: AC

## 2022-12-06 NOTE — Progress Notes (Unsigned)
PCP: Jacky Kindle, FNP (last seen 04/24) Primary Cardiologist: Julien Nordmann, MD (last seen 01/24)  HPI:  Mr Dettmer is a 67 y/o male with a history of CAD, DM, hyperlipidemia, HTN, OSA, BPH, GERD, previous tobacco use, chronic back pain with spinal stimulator and chronic heart failure.   Echo 12/11/21: EF of 55-60%  LHC 05/02/18:  The left ventricular systolic function is normal. LV end diastolic pressure is mildly elevated. The left ventricular ejection fraction is 55-65% by visual estimate. Prox RCA to Mid RCA lesion is 10% stenosed. Prox RCA lesion is 30% stenosed. Dist RCA lesion is 20% stenosed. SVG graft was visualized by angiography. The graft exhibits mild diffuse disease. Ost Cx to Prox Cx lesion is 95% stenosed. Mid LM to Dist LM lesion is 80% stenosed. LIMA and is normal in caliber. The graft exhibits no disease.  1. Significant underlying left main and RCA disease with patent grafts including LIMA to LAD and SVG to OM 2.  Patent RCA stent with minimal restenosis. 2.  Normal LV systolic function.  Mildly elevated left ventricular end-diastolic pressure at 14 to 15 mmHg.  Has not been admitted or been in the ED in the last 6 months.   He presents today for a HF visit with a chief complaint of moderate fatigue with minimal exertion. Chronic in nature. Has associated dizziness even with the valium, dry cough and chronic neck/ back pain along with this. Denies shortness of breath, chest pain, palpitations or difficulty sleeping. Is wondering if his neck spurs are causing the tingling in his neck when he bends his neck back to look up. Has upcoming PCP appt that he's going to discuss this with. Has lost some weight since he was last here and says that he's trying to eat healthier as well as eating less.   At previous visit, he had been taking carvedilol 25mg  once daily and this was changed to 12.5mg  BID. Brought his BP log from home and home BP's look good.   ROS: All  systems negative except as listed in HPI, PMH and Problem List.  SH:  Social History   Socioeconomic History   Marital status: Divorced    Spouse name: Not on file   Number of children: Not on file   Years of education: Not on file   Highest education level: Not on file  Occupational History   Not on file  Tobacco Use   Smoking status: Former    Packs/day: 0.25    Years: 2.00    Additional pack years: 0.00    Total pack years: 0.50    Types: Cigarettes    Quit date: 05/26/2005    Years since quitting: 17.5   Smokeless tobacco: Former    Types: Associate Professor Use: Never used  Substance and Sexual Activity   Alcohol use: No   Drug use: No   Sexual activity: Not on file  Other Topics Concern   Not on file  Social History Narrative   Lives at home with [name] (friend)    Social Determinants of Health   Financial Resource Strain: Medium Risk (12/25/2020)   Overall Financial Resource Strain (CARDIA)    Difficulty of Paying Living Expenses: Somewhat hard  Food Insecurity: Not on file  Transportation Needs: No Transportation Needs (07/30/2020)   PRAPARE - Administrator, Civil Service (Medical): No    Lack of Transportation (Non-Medical): No  Physical Activity: Not on file  Stress: Not  on file  Social Connections: Not on file  Intimate Partner Violence: Not on file    FH:  Family History  Problem Relation Age of Onset   Arthritis Mother    Hyperlipidemia Father    Hypertension Father    Heart attack Father 64   Heart murmur Brother    Valvular heart disease Brother    Cataracts Maternal Grandmother    Glaucoma Maternal Grandmother    Cancer Maternal Grandfather    Heart attack Paternal Grandfather    Hypertension Paternal Grandfather    Prostate cancer Neg Hx    Bladder Cancer Neg Hx    Kidney cancer Neg Hx     Past Medical History:  Diagnosis Date   Anginal pain (HCC)    Arthritis    BPH (benign prostatic hyperplasia)    CHF  (congestive heart failure) (HCC)    Coronary artery disease 2010   a.) LHC 2010: high grade RCA stenosis -> 2.5 x 26mm Cypher DES to RCA. b.) NSTEMI 2016 -> PCI revealed pat RCA stent; sig dLM Dz with FFR ratio 0.76 and oLCx stenosis; ref to CVTS. c.) 2v CABG 04/21/2015; LIMA-LAD, SVG-OM2. d.) [facility] 05/02/2018: EF 55-65%; LVEDP 14-15 mmHg; 10% p-m RCA, 30% pRCA, 20% dRCA, 95% o-pLCx, 80% m-dLM; LIMA graft pat. SVG graft with mild diffuse Dz.   DDD (degenerative disc disease), lumbar    Dyspnea    GERD (gastroesophageal reflux disease)    Hypercholesteremia    Hypertension    Left foot drop    NSTEMI (non-ST elevated myocardial infarction) (HCC) 2016   a.) PCI revealed patent RCA stent; significant dLM disease with FFR ratio 0.76 and oLCx stenosis; refer to CVTS for CABG.   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 Takes once a day     aspirin EC 81 MG tablet Take 81 mg by mouth daily. Swallow whole.     atorvastatin (LIPITOR) 40 MG tablet Take 1 tablet (40 mg total) by mouth daily. 90 tablet 3   bisacodyl (DULCOLAX) 5 MG EC tablet Take 5 mg by mouth daily as needed for moderate constipation.     Blood Glucose Monitoring Suppl (FREESTYLE LITE) DEVI To check blood sugar once daily 1 each 0   carvedilol (COREG) 25 MG tablet Take 1 tablet (25 mg total) by mouth 2 (two) times daily with a meal. (Patient taking differently: Take 25 mg by mouth daily.) 180 tablet 3   Cholecalciferol (VITAMIN D3) 50 MCG (2000 UT) capsule Take 2,000 Units by mouth daily.     clobetasol (TEMOVATE) 0.05 % external solution Apply 1 Application topically 2 (two) times daily. 50 mL 11   cyanocobalamin (VITAMIN B12) 1000 MCG tablet  Take 1,000 mcg by mouth daily.     cyclobenzaprine (FLEXERIL) 10 MG tablet TAKE 1 TABLET THREE TIMES A DAY AS NEEDED FOR MUSCLE SPASMS (Patient taking differently: Take 10 mg by mouth 2 (two) times daily.) 90 tablet 11   diazepam (VALIUM) 2 MG tablet Take 2 mg by mouth every 8 (eight) hours as needed.     empagliflozin (JARDIANCE) 10 MG TABS tablet Take 1 tablet (10 mg total) by  mouth daily before breakfast. 30 tablet 5   ezetimibe (ZETIA) 10 MG tablet Take 1 tablet (10 mg total) by mouth daily. 90 tablet 1   FREESTYLE LITE test strip USE TO CHECK BLOOD SUGAR ONCE DAILY 100 strip 3   indomethacin (INDOCIN) 50 MG capsule Take 50 mg by mouth daily as needed (gout flare). Taking once daily     isosorbide mononitrate (IMDUR) 60 MG 24 hr tablet TAKE 1 TABLET DAILY 90 tablet 3   ketoconazole (NIZORAL) 2 % shampoo APPLY 1 APPLICATION TOPICALLY 2 TIMES WEEKLY AS DIRECTED 120 mL 3   Lancets (FREESTYLE) lancets USE TO CHECK BLOOD SUGAR ONCE DAILY 100 each 4   metFORMIN (GLUCOPHAGE-XR) 500 MG 24 hr tablet TAKE 2 TABLETS TWICE A DAY WITH MEALS 360 tablet 0   Multiple Vitamin (MULTIVITAMIN WITH MINERALS) TABS tablet Take 1 tablet by mouth daily.     nitroGLYCERIN (NITROSTAT) 0.4 MG SL tablet Place 1 tablet (0.4 mg total) under the tongue every 5 (five) minutes as needed for chest pain. 25 tablet 3   OZEMPIC, 0.25 OR 0.5 MG/DOSE, 2 MG/3ML SOPN INJECT 0.5 MG UNDER THE SKIN ONCE A WEEK (Patient taking differently: Inject 0.5 mg into the skin once a week. Sundays) 9 mL 3   pantoprazole (PROTONIX) 40 MG tablet TAKE 1 TABLET DAILY 90 tablet 3   potassium chloride (KLOR-CON) 10 MEQ tablet TAKE 2 TABLETS TWICE A DAY 360 tablet 1   pregabalin (LYRICA) 150 MG capsule Take 150 mg by mouth 2 (two) times daily.     sacubitril-valsartan (ENTRESTO) 24-26 MG Take 1 tablet by mouth 2 (two) times daily.     torsemide (DEMADEX) 20 MG tablet Take 2 tablets (40 mg total) by mouth every other day. (Patient taking differently: Take  40 mg by mouth daily. Two tablets once daily plus one extra tablet daily as needed for swelling)     No current facility-administered medications for this visit.   Vitals:   12/07/22 0841  BP: 122/74  Pulse: 70  Weight: 215 lb 12.8 oz (97.9 kg)   Wt Readings from Last 3 Encounters:  12/07/22 215 lb 12.8 oz (97.9 kg)  10/30/22 219 lb 12.8 oz (99.7 kg)  10/28/22 210 lb 6.4 oz (95.4 kg)   Lab Results  Component Value Date   CREATININE 1.97 (H) 09/30/2022   CREATININE 1.29 (H) 02/10/2022   CREATININE 1.14 01/09/2022   PHYSICAL EXAM:  General:  Well appearing. No resp difficulty HEENT: normal Neck: supple. JVP flat. No lymphadenopathy or thryomegaly appreciated. Cor: PMI normal. Regular rate & rhythm. No rubs, gallops or murmurs. Lungs: clear Abdomen: soft, nontender, nondistended. No hepatosplenomegaly. No bruits or masses.  Extremities: no cyanosis, clubbing, rash, edema Neuro: alert & oriented x3, cranial nerves grossly intact. Moves all 4 extremities w/o difficulty. Affect pleasant.  ECG: not done  ASSESSMENT & PLAN:  1: Ischemic heart failure with preserved ejection fraction- - Etiology: CAD w/ previous CABG - NYHA class II - euvolemic today - weighing daily; reminded to call for an overnight weight gain of > 2 pounds or a weekly weight gain of > 5 pounds - weight down 4 pounds from last visit here 2 months ago - Echo 12/11/21: EF of 55-60%; will update this at next visit - continue carvedilol 12.5mg  BID - continue entresto 24/26mg  BID - continue torsemide 40mg  daily w/ extra 20mg  PRN/ potassium BID - continue jardiance 10mg  daily - using NoSalt although will use regular salt on watermelon/ cantaloupe -  knows to keep daily fluid intake to 64 ounces but says that he's drinking way more than that due to his dry mouth; says that he can drink up to 120 oz daily - BNP 01/06/22 was 73.0  2: HTN- - BP 122/74 - home bp log reviewed and showed SBP 108-135 and DBP  69-80 - saw PCP Suzie Portela) 4/24 - saw nephrology Thedore Mins) 05/24 - BMP 10/06/22 reviewed and showed sodium 138, potassium 3.7, creatinine 1.52 and GFR 50  3: DM- - A1c 09/30/22 was 6.0% - home glucose ranges from 77-126  4: CAD- - saw cardiology Mariah Milling) 01/24 - LHC 05/02/18:  The left ventricular systolic function is normal. LV end diastolic pressure is mildly elevated. The left ventricular ejection fraction is 55-65% by visual estimate. Prox RCA to Mid RCA lesion is 10% stenosed. Prox RCA lesion is 30% stenosed. Dist RCA lesion is 20% stenosed. SVG graft was visualized by angiography. The graft exhibits mild diffuse disease. Ost Cx to Prox Cx lesion is 95% stenosed. Mid LM to Dist LM lesion is 80% stenosed. LIMA and is normal in caliber. The graft exhibits no disease.  1. Significant underlying left main and RCA disease with patent grafts including LIMA to LAD and SVG to OM 2.  Patent RCA stent with minimal restenosis. 2.  Normal LV systolic function.  Mildly elevated left ventricular end-diastolic pressure at 14 to 15 mmHg.  5: Lymphedema- - saw vascular (Dew) 10/23 - wearing compression socks a few times a week with good results; encouraged to wear them daily  6: OSA- - has worn CPAP for >20 years  Return in 6 months, sooner if needed.

## 2022-12-07 ENCOUNTER — Ambulatory Visit: Payer: Medicare PPO | Attending: Family | Admitting: Family

## 2022-12-07 ENCOUNTER — Encounter: Payer: Self-pay | Admitting: Family

## 2022-12-07 VITALS — BP 122/74 | HR 70 | Wt 215.8 lb

## 2022-12-07 DIAGNOSIS — Z8249 Family history of ischemic heart disease and other diseases of the circulatory system: Secondary | ICD-10-CM | POA: Insufficient documentation

## 2022-12-07 DIAGNOSIS — E1151 Type 2 diabetes mellitus with diabetic peripheral angiopathy without gangrene: Secondary | ICD-10-CM

## 2022-12-07 DIAGNOSIS — I2510 Atherosclerotic heart disease of native coronary artery without angina pectoris: Secondary | ICD-10-CM | POA: Insufficient documentation

## 2022-12-07 DIAGNOSIS — I890 Lymphedema, not elsewhere classified: Secondary | ICD-10-CM

## 2022-12-07 DIAGNOSIS — I5032 Chronic diastolic (congestive) heart failure: Secondary | ICD-10-CM | POA: Diagnosis not present

## 2022-12-07 DIAGNOSIS — Z5986 Financial insecurity: Secondary | ICD-10-CM | POA: Insufficient documentation

## 2022-12-07 DIAGNOSIS — E119 Type 2 diabetes mellitus without complications: Secondary | ICD-10-CM | POA: Diagnosis not present

## 2022-12-07 DIAGNOSIS — I25118 Atherosclerotic heart disease of native coronary artery with other forms of angina pectoris: Secondary | ICD-10-CM

## 2022-12-07 DIAGNOSIS — Z955 Presence of coronary angioplasty implant and graft: Secondary | ICD-10-CM | POA: Insufficient documentation

## 2022-12-07 DIAGNOSIS — G4730 Sleep apnea, unspecified: Secondary | ICD-10-CM

## 2022-12-07 DIAGNOSIS — Z951 Presence of aortocoronary bypass graft: Secondary | ICD-10-CM | POA: Insufficient documentation

## 2022-12-07 DIAGNOSIS — Z8261 Family history of arthritis: Secondary | ICD-10-CM | POA: Insufficient documentation

## 2022-12-07 DIAGNOSIS — I110 Hypertensive heart disease with heart failure: Secondary | ICD-10-CM | POA: Diagnosis not present

## 2022-12-07 DIAGNOSIS — Z79899 Other long term (current) drug therapy: Secondary | ICD-10-CM | POA: Diagnosis not present

## 2022-12-07 DIAGNOSIS — Z7984 Long term (current) use of oral hypoglycemic drugs: Secondary | ICD-10-CM | POA: Insufficient documentation

## 2022-12-07 DIAGNOSIS — Z87891 Personal history of nicotine dependence: Secondary | ICD-10-CM | POA: Insufficient documentation

## 2022-12-07 DIAGNOSIS — I10 Essential (primary) hypertension: Secondary | ICD-10-CM

## 2022-12-07 DIAGNOSIS — G4733 Obstructive sleep apnea (adult) (pediatric): Secondary | ICD-10-CM | POA: Diagnosis not present

## 2022-12-07 DIAGNOSIS — Z8349 Family history of other endocrine, nutritional and metabolic diseases: Secondary | ICD-10-CM | POA: Diagnosis not present

## 2022-12-07 DIAGNOSIS — I5030 Unspecified diastolic (congestive) heart failure: Secondary | ICD-10-CM | POA: Diagnosis present

## 2022-12-07 NOTE — Patient Instructions (Signed)
It was good to see you today! Keep up the great work and be careful in this hot/ humid weather.

## 2022-12-28 DIAGNOSIS — R200 Anesthesia of skin: Secondary | ICD-10-CM | POA: Diagnosis not present

## 2022-12-28 DIAGNOSIS — R202 Paresthesia of skin: Secondary | ICD-10-CM | POA: Diagnosis not present

## 2022-12-28 DIAGNOSIS — R29818 Other symptoms and signs involving the nervous system: Secondary | ICD-10-CM | POA: Diagnosis not present

## 2022-12-28 DIAGNOSIS — M21372 Foot drop, left foot: Secondary | ICD-10-CM | POA: Diagnosis not present

## 2022-12-28 DIAGNOSIS — R42 Dizziness and giddiness: Secondary | ICD-10-CM | POA: Diagnosis not present

## 2022-12-31 ENCOUNTER — Ambulatory Visit (INDEPENDENT_AMBULATORY_CARE_PROVIDER_SITE_OTHER): Payer: Medicare PPO | Admitting: Family Medicine

## 2022-12-31 ENCOUNTER — Encounter: Payer: Self-pay | Admitting: Family Medicine

## 2022-12-31 VITALS — BP 102/65 | HR 85 | Wt 207.2 lb

## 2022-12-31 DIAGNOSIS — M5416 Radiculopathy, lumbar region: Secondary | ICD-10-CM | POA: Diagnosis not present

## 2022-12-31 DIAGNOSIS — E1151 Type 2 diabetes mellitus with diabetic peripheral angiopathy without gangrene: Secondary | ICD-10-CM | POA: Diagnosis not present

## 2022-12-31 DIAGNOSIS — N5203 Combined arterial insufficiency and corporo-venous occlusive erectile dysfunction: Secondary | ICD-10-CM

## 2022-12-31 DIAGNOSIS — Z91199 Patient's noncompliance with other medical treatment and regimen due to unspecified reason: Secondary | ICD-10-CM | POA: Insufficient documentation

## 2022-12-31 DIAGNOSIS — L409 Psoriasis, unspecified: Secondary | ICD-10-CM | POA: Diagnosis not present

## 2022-12-31 DIAGNOSIS — R7989 Other specified abnormal findings of blood chemistry: Secondary | ICD-10-CM | POA: Insufficient documentation

## 2022-12-31 HISTORY — DX: Combined arterial insufficiency and corporo-venous occlusive erectile dysfunction: N52.03

## 2022-12-31 MED ORDER — DERMAZINC SHAMPOO 2 % EX SHAM
1.0000 | MEDICATED_SHAMPOO | CUTANEOUS | 0 refills | Status: AC | PRN
Start: 2022-12-31 — End: ?

## 2022-12-31 MED ORDER — PREGABALIN 300 MG PO CAPS: 300.0000 mg | ORAL_CAPSULE | Freq: Two times a day (BID) | ORAL | 0 refills | Status: DC

## 2022-12-31 NOTE — Progress Notes (Signed)
Established patient visit   Patient: Jonathon Newman   DOB: 13-Oct-1955   67 y.o. Male  MRN: [id number] Visit Date: 12/31/2022  Today's healthcare provider: Jacky Kindle, FNP  Introduced to nurse practitioner role and practice setting.  All questions answered.  Discussed provider/patient relationship and expectations.  Chief Complaint  Patient presents with   Medication Management   Neck Pain   Subjective    Neck Pain    HPI   Patient is requesting a refill on his Ketoconazole shampoo prescription.  Last edited by Malen Gauze, CMA on 12/31/2022  1:17 PM.      Medications: Outpatient Medications Prior to Visit  Medication Sig   acetaminophen (TYLENOL) 500 MG tablet Take 1,000 mg by mouth every 8 (eight) hours as needed for mild pain or moderate pain. Takes once a day   aspirin EC 81 MG tablet Take 81 mg by mouth daily. Swallow whole.   atorvastatin (LIPITOR) 40 MG tablet Take 1 tablet (40 mg total) by mouth daily.   bisacodyl (DULCOLAX) 5 MG EC tablet Take 5 mg by mouth daily as needed for moderate constipation.   Blood Glucose Monitoring Suppl (FREESTYLE LITE) DEVI To check blood sugar once daily   carvedilol (COREG) 25 MG tablet Take 1 tablet (25 mg total) by mouth 2 (two) times daily with a meal. (Patient taking differently: Take 25 mg by mouth 2 (two) times daily with a meal. Patient is currently taking 0.5 tablet twice a day.)   Cholecalciferol (VITAMIN D3) 50 MCG (2000 UT) capsule Take 2,000 Units by mouth daily.   clobetasol (TEMOVATE) 0.05 % external solution Apply 1 Application topically 2 (two) times daily.   cyanocobalamin (VITAMIN B12) 1000 MCG tablet Take 1,000 mcg by mouth daily.   cyclobenzaprine (FLEXERIL) 10 MG tablet TAKE 1 TABLET THREE TIMES A DAY AS NEEDED FOR MUSCLE SPASMS (Patient taking differently: Take 10 mg by mouth 2 (two) times daily.)   diazepam (VALIUM) 2 MG tablet Take 2 mg by mouth every 8 (eight) hours as needed.   empagliflozin  (JARDIANCE) 10 MG TABS tablet Take 1 tablet (10 mg total) by mouth daily before breakfast.   ezetimibe (ZETIA) 10 MG tablet Take 1 tablet (10 mg total) by mouth daily.   FREESTYLE LITE test strip USE TO CHECK BLOOD SUGAR ONCE DAILY   indomethacin (INDOCIN) 50 MG capsule Take 50 mg by mouth daily as needed (gout flare). Taking once daily   isosorbide mononitrate (IMDUR) 60 MG 24 hr tablet TAKE 1 TABLET DAILY   ketoconazole (NIZORAL) 2 % shampoo APPLY 1 APPLICATION TOPICALLY 2 TIMES WEEKLY AS DIRECTED   Lancets (FREESTYLE) lancets USE TO CHECK BLOOD SUGAR ONCE DAILY   metFORMIN (GLUCOPHAGE-XR) 500 MG 24 hr tablet TAKE 2 TABLETS TWICE A DAY WITH MEALS   Multiple Vitamin (MULTIVITAMIN WITH MINERALS) TABS tablet Take 1 tablet by mouth daily.   nitroGLYCERIN (NITROSTAT) 0.4 MG SL tablet Place 1 tablet (0.4 mg total) under the tongue every 5 (five) minutes as needed for chest pain.   OZEMPIC, 0.25 OR 0.5 MG/DOSE, 2 MG/3ML SOPN INJECT 0.5 MG UNDER THE SKIN ONCE A WEEK (Patient taking differently: Inject 0.5 mg into the skin once a week. Sundays)   pantoprazole (PROTONIX) 40 MG tablet TAKE 1 TABLET DAILY   potassium chloride (KLOR-CON) 10 MEQ tablet TAKE 2 TABLETS TWICE A DAY   sacubitril-valsartan (ENTRESTO) 24-26 MG Take 1 tablet by mouth 2 (two) times daily.   torsemide (DEMADEX) 20 MG  tablet Take 2 tablets (40 mg total) by mouth every other day. (Patient taking differently: Take 40 mg by mouth daily. Two tablets once daily plus one extra tablet daily as needed for swelling)   [DISCONTINUED] pregabalin (LYRICA) 150 MG capsule Take 150 mg by mouth 2 (two) times daily.   No facility-administered medications prior to visit.   Review of Systems  Musculoskeletal:  Positive for neck pain.     Objective    BP 102/65   Pulse 85   Wt 207 lb 3.2 oz (94 kg)   SpO2 96%   BMI 32.45 kg/m   Physical Exam Vitals and nursing note reviewed.  Constitutional:      Appearance: Normal appearance. He is obese.   HENT:     Head: Normocephalic and atraumatic.  Cardiovascular:     Rate and Rhythm: Normal rate and regular rhythm.     Pulses: Normal pulses.     Heart sounds: Normal heart sounds.  Pulmonary:     Effort: Pulmonary effort is normal.     Breath sounds: Normal breath sounds.  Genitourinary:    Comments: Endorses difficulty reaching sexual arousal  Musculoskeletal:        General: Normal range of motion.     Cervical back: Normal range of motion.  Skin:    General: Skin is warm and dry.     Capillary Refill: Capillary refill takes less than 2 seconds.  Neurological:     General: No focal deficit present.     Mental Status: He is alert and oriented to person, place, and time. Mental status is at baseline.  Psychiatric:        Mood and Affect: Mood normal.        Behavior: Behavior normal.        Thought Content: Thought content normal.        Judgment: Judgment normal.     No results found for any visits on 12/31/22.  Assessment & Plan     Problem List Items Addressed This Visit       Cardiovascular and Mediastinum   Combined arterial insufficiency and corporo-venous occlusive erectile dysfunction    Ongoing concern; previously tried oral medications Inquiring regarding use of testosterone; however, would be better suited for further evaluation by urology Pt agreeable to referral      Relevant Orders   Ambulatory referral to Urology   Type 2 diabetes mellitus with diabetic peripheral angiopathy without gangrene, without long-term current use of insulin (HCC) - Primary    Chronic; repeat A1c Continue to recommend balanced, lower carb meals. Smaller meal size, adding snacks. Choosing water as drink of choice and increasing purposeful exercise. May be due for DM eye exam; encouraged to check.       Relevant Medications   pregabalin (LYRICA) 300 MG capsule   Other Relevant Orders   Basic Metabolic Panel (BMET)   Hemoglobin A1c     Nervous and Auditory   Lumbar  radiculopathy    Chronic; worsening Previously seen by neurology Labs previously stable Increase lyrica to assist; plan step wise increase from 150 bid to 150/300 and then new rx for 300/300      Relevant Medications   pregabalin (LYRICA) 300 MG capsule     Musculoskeletal and Integument   Psoriasis of scalp    Chronic; stable Request for topical shampoos to assist Continue to monitor       Relevant Medications   Pyrithione Zinc (DERMAZINC SHAMPOO) 2 % SHAM  Return in about 3 months (around 04/02/2023) for chonic disease management.     Leilani Merl, FNP, have reviewed all documentation for this visit. The documentation on 12/31/22 for the exam, diagnosis, procedures, and orders are all accurate and complete.  Jacky Kindle, FNP  [facility] [phone] (phone) [phone] (fax)  [facility]

## 2022-12-31 NOTE — Assessment & Plan Note (Signed)
Chronic; repeat A1c Continue to recommend balanced, lower carb meals. Smaller meal size, adding snacks. Choosing water as drink of choice and increasing purposeful exercise. May be due for DM eye exam; encouraged to check.

## 2022-12-31 NOTE — Assessment & Plan Note (Signed)
Ongoing concern; previously tried oral medications Inquiring regarding use of testosterone; however, would be better suited for further evaluation by urology Pt agreeable to referral

## 2022-12-31 NOTE — Addendum Note (Signed)
Addended by: Merita Norton T on: 12/31/2022 12:58 PM   Modules accepted: Level of Service

## 2022-12-31 NOTE — Patient Instructions (Signed)
Ensure you have had your diabetic eye exam

## 2022-12-31 NOTE — Assessment & Plan Note (Signed)
Chronic; worsening Previously seen by neurology Labs previously stable Increase lyrica to assist; plan step wise increase from 150 bid to 150/300 and then new rx for 300/300

## 2022-12-31 NOTE — Assessment & Plan Note (Signed)
Chronic; stable Request for topical shampoos to assist Continue to monitor

## 2022-12-31 NOTE — Progress Notes (Addendum)
Patient was not seen for appt d/t no call, no show, or late arrival >10 mins past appt time.   No show charge not applied as pt was rescheduled later today.  Jacky Kindle, FNP  [facility] [address] [place], [place] [address] [phone] (phone) [phone] (fax) [facility]

## 2023-01-01 LAB — BASIC METABOLIC PANEL
BUN/Creatinine Ratio: 20 (ref 10–24)
BUN: 34 mg/dL — ABNORMAL HIGH (ref 8–27)
CO2: 22 mmol/L (ref 20–29)
Calcium: 9.9 mg/dL (ref 8.6–10.2)
Chloride: 101 mmol/L (ref 96–106)
Creatinine, Ser: 1.66 mg/dL — ABNORMAL HIGH (ref 0.76–1.27)
Glucose: 116 mg/dL — ABNORMAL HIGH (ref 70–99)
Potassium: 4.4 mmol/L (ref 3.5–5.2)
Sodium: 140 mmol/L (ref 134–144)
eGFR: 45 mL/min/{1.73_m2} — ABNORMAL LOW (ref >59)

## 2023-01-01 LAB — HEMOGLOBIN A1C
Est. average glucose Bld gHb Est-mCnc: 120 mg/dL
Hgb A1c MFr Bld: 5.8 % — ABNORMAL HIGH (ref 4.8–5.6)

## 2023-01-01 NOTE — Progress Notes (Signed)
Kidney function continues to show improvement. A1c remains very well controlled as we envisioned. Continue to recommend balanced, lower carb meals. Smaller meal size, adding snacks. Choosing water as drink of choice and increasing purposeful exercise.

## 2023-01-03 ENCOUNTER — Encounter: Payer: Self-pay | Admitting: Family Medicine

## 2023-01-12 ENCOUNTER — Other Ambulatory Visit: Payer: Self-pay | Admitting: Family Medicine

## 2023-01-14 ENCOUNTER — Other Ambulatory Visit: Payer: Self-pay | Admitting: Family Medicine

## 2023-01-14 ENCOUNTER — Telehealth: Payer: Self-pay

## 2023-01-14 NOTE — Telephone Encounter (Signed)
Copied from CRM [id number]. Topic: General - Other >> Jan 13, 2023  3:41 PM Phill Myron wrote: Mr. Zaitz stated that his diabetic shoe request should be coming over today on the fax,  can it be signed and sent back as soon as possible; Thank you

## 2023-01-17 ENCOUNTER — Other Ambulatory Visit: Payer: Self-pay | Admitting: Family Medicine

## 2023-01-17 DIAGNOSIS — E1151 Type 2 diabetes mellitus with diabetic peripheral angiopathy without gangrene: Secondary | ICD-10-CM

## 2023-01-18 NOTE — Telephone Encounter (Signed)
Requested Prescriptions  Pending Prescriptions Disp Refills   metFORMIN (GLUCOPHAGE-XR) 500 MG 24 hr tablet [Pharmacy Med Name: METFORMIN HCL ER TABS 500MG ] 360 tablet 0    Sig: TAKE 2 TABLETS TWICE A DAY WITH MEALS     Endocrinology:  Diabetes - Biguanides Failed - 01/17/2023 10:34 AM      Failed - Cr in normal range and within 360 days    Creatinine, Ser  Date Value Ref Range Status  12/31/2022 1.66 (H) 0.76 - 1.27 mg/dL Final         Failed - eGFR in normal range and within 360 days    GFR calc Af Amer  Date Value Ref Range Status  06/28/2020 72 >59 mL/min/1.73 Final    Comment:    **In accordance with recommendations from the NKF-ASN Task force,**   [facility] is in the process of updating its eGFR calculation to the   2021 CKD-EPI creatinine equation that estimates kidney function   without a race variable.    GFR, Estimated  Date Value Ref Range Status  02/10/2022 >60 >60 mL/min Final    Comment:    (NOTE) Calculated using the CKD-EPI Creatinine Equation (2021)    eGFR  Date Value Ref Range Status  12/31/2022 45 (L) >59 mL/min/1.73 Final         Passed - HBA1C is between 0 and 7.9 and within 180 days    Hgb A1c MFr Bld  Date Value Ref Range Status  12/31/2022 5.8 (H) 4.8 - 5.6 % Final    Comment:             Prediabetes: 5.7 - 6.4          Diabetes: >6.4          Glycemic control for adults with diabetes: <7.0          Passed - B12 Level in normal range and within 720 days    Vitamin B-12  Date Value Ref Range Status  09/30/2022 595 232 - 1,245 pg/mL Final         Passed - Valid encounter within last 6 months    Recent Outpatient Visits           2 weeks ago Type 2 diabetes mellitus with diabetic peripheral angiopathy without gangrene, without long-term current use of insulin (HCC)   [place] Merita Norton T, FNP   2 weeks ago No-show for appointment   [facility] Merita Norton T, FNP   1 year ago  AKI (acute kidney injury) [facility])   [facility] Merita Norton T, FNP   1 year ago Chronic diastolic CHF (congestive heart failure) [facility])   [place] [facility] Merita Norton T, FNP   1 year ago Psoriasis of scalp   [facility] Merita Norton T, FNP              Passed - CBC within normal limits and completed in the last 12 months    WBC  Date Value Ref Range Status  09/30/2022 5.4 3.4 - 10.8 x10E3/uL Final  01/06/2022 6.0 4.0 - 10.5 K/uL Final   RBC  Date Value Ref Range Status  09/30/2022 4.56 4.14 - 5.80 x10E6/uL Final  01/06/2022 3.89 (L) 4.22 - 5.81 MIL/uL Final   Hemoglobin  Date Value Ref Range Status  09/30/2022 14.3 13.0 - 17.7 g/dL Final   Hematocrit  Date Value Ref Range Status  09/30/2022  41.1 37.5 - 51.0 % Final   MCHC  Date Value Ref Range Status  09/30/2022 34.8 31.5 - 35.7 g/dL Final  09/81/1914 78.2 30.0 - 36.0 g/dL Final   [facility]  Date Value Ref Range Status  09/30/2022 31.4 26.6 - 33.0 pg Final  01/06/2022 30.8 26.0 - 34.0 pg Final   MCV  Date Value Ref Range Status  09/30/2022 90 79 - 97 fL Final   No results found for: "PLTCOUNTKUC", "LABPLAT", "POCPLA" RDW  Date Value Ref Range Status  09/30/2022 12.6 11.6 - 15.4 % Final

## 2023-01-19 ENCOUNTER — Encounter: Payer: Self-pay | Admitting: Family Medicine

## 2023-01-19 NOTE — Telephone Encounter (Signed)
Pt is calling requesting an update regarding his diabetic shoes. Asking if this has been signed by an MD and returned. Pt is requesting a callback with an update.   Please advise

## 2023-01-27 ENCOUNTER — Telehealth: Payer: Self-pay | Admitting: Podiatry

## 2023-01-27 NOTE — Telephone Encounter (Signed)
Lmom for pt to call back to set up appt to pick up diabetic shoes, shoes are currently in [place] , will ship to [place] when scheduled.

## 2023-02-05 ENCOUNTER — Ambulatory Visit (INDEPENDENT_AMBULATORY_CARE_PROVIDER_SITE_OTHER): Payer: Medicare PPO

## 2023-02-05 ENCOUNTER — Other Ambulatory Visit: Payer: Self-pay | Admitting: Family Medicine

## 2023-02-05 DIAGNOSIS — M2042 Other hammer toe(s) (acquired), left foot: Secondary | ICD-10-CM | POA: Diagnosis not present

## 2023-02-05 DIAGNOSIS — E1100 Type 2 diabetes mellitus with hyperosmolarity without nonketotic hyperglycemic-hyperosmolar coma (NKHHC): Secondary | ICD-10-CM

## 2023-02-05 DIAGNOSIS — M21372 Foot drop, left foot: Secondary | ICD-10-CM

## 2023-02-05 DIAGNOSIS — M2041 Other hammer toe(s) (acquired), right foot: Secondary | ICD-10-CM | POA: Diagnosis not present

## 2023-02-05 DIAGNOSIS — E1151 Type 2 diabetes mellitus with diabetic peripheral angiopathy without gangrene: Secondary | ICD-10-CM

## 2023-02-05 DIAGNOSIS — G5763 Lesion of plantar nerve, bilateral lower limbs: Secondary | ICD-10-CM

## 2023-02-05 NOTE — Progress Notes (Signed)

## 2023-02-21 ENCOUNTER — Other Ambulatory Visit: Payer: Self-pay | Admitting: Cardiovascular Disease

## 2023-02-26 ENCOUNTER — Telehealth: Payer: Self-pay | Admitting: Cardiovascular Disease

## 2023-02-26 ENCOUNTER — Other Ambulatory Visit: Payer: Self-pay | Admitting: Family Medicine

## 2023-02-26 DIAGNOSIS — G4733 Obstructive sleep apnea (adult) (pediatric): Secondary | ICD-10-CM | POA: Diagnosis not present

## 2023-02-26 DIAGNOSIS — I5032 Chronic diastolic (congestive) heart failure: Secondary | ICD-10-CM

## 2023-02-26 DIAGNOSIS — R600 Localized edema: Secondary | ICD-10-CM

## 2023-02-26 NOTE — Telephone Encounter (Signed)
Left voice mail. Patient needs to have appt scheduled from recall.

## 2023-02-27 NOTE — Progress Notes (Unsigned)
Cardiology Office Note    Date:  03/02/2023   ID:  Jonathon Newman, DOB May 13, 1956, MRN [id number]  PCP:  Jacky Kindle, FNP  Cardiologist:  Julien Nordmann, MD  Electrophysiologist:  None   Chief Complaint: Follow up  History of Present Illness:   Jonathon Newman is a 67 y.o. male with history of CAD s/p 2-vessel CABG in 2016 at the [facility], HFpEF, chronic positional dizziness felt to BPPV, chronic dyspnea, prediabetes, CKD stage III, HTN, HLD, obesity, sleep apnea on CPAP, chronic back pain, and prior tobacco abuse who presents for follow-up of CAD and HFpEF.    He is retired from [employer] though is no longer eligible for his care through the [employer]. He previously underwent Cypher drug-eluting stent to the RCA in 2010. This was followed by a NSTEMI in 2016 with cardiac cath showing a patent RCA stent with significant distal left main disease with an FFR of 0.76 and ostial LCx stenosis. He underwent 2-vessel CABG with a LIMA to LAD and SVG to OM2 in 2016. He was seen in 06/2017 for atypical chest pain and exertional dyspnea. He underwent Lexiscan Myoview on 07/08/2017 that showed no evidence of ischemia with a normal EF. He has required adjustments to his outpatient diuretic regimen periodically.  He underwent diagnostic R/LHC on 05/02/2018 that showed significant underlying left main disease with patent LIMA to LAD and SVG to OM2. The RCA stent was patent with minimal restenosis. He had normal LVSF with a mildly elevated LVEDP at 14-15 mmHg. There was no cardiac culprit for his symptoms based on this cath. Medical therapy was advised.  For intermittent palpitations/dizziness, he underwent outpatient cardiac monitoring that showed NSR with an average heart rate of 72 bpm with a minimum heart rate of 50 bpm, 3 short runs of SVT, no significant arrhythmia to explain the patient's dizziness.  Lexiscan MPI in 10/2019, showed no evidence of ischemia with an EF of 52%.  He  underwent back surgery in 2022 complicated by foot drop and falls status post stimulator.  Echo in 11/2021 demonstrated an EF of 55 to 60%, no regional wall motion abnormalities, grade 2 diastolic dysfunction, normal RV systolic function with mildly enlarged ventricular cavity size, trivial aortic insufficiency, and an estimated right atrial pressure of 3 mmHg.  He has continued to progressive chronic dizziness that has been exacerbated by laying flat and gazing upwards.  Symptoms have also been associated with room spinning sensation.  Prior MRI of the brain in 05/2016, 03/2020, and 02/2021 showed no acute intracranial abnormality with findings of mild chronic small vessel ischemia and generalized volume loss noted.  He has previously reported vertigo therapies to be ineffective.  Lexiscan MPI in 02/2022 showed no significant ischemia with an EF of 62% and was overall low risk.  Outpatient cardiac monitoring showed a predominant rhythm of sinus with an average rate of 73 bpm (range 48 to 118 bpm, 2 episodes of SVT with the fastest and longest interval lasting just 4 beats with a maximal rate of 118 bpm, frequent PACs representing an 8% burden, rare PVCs, and patient triggered events corresponding with sinus rhythm and rare PAC.  Overall, no significant arrhythmia identified.  Carotid artery ultrasound in 03/2022 showed near normal bilateral internal carotid arteries with anterograde flow of the bilateral vertebral arteries and normal flow hemodynamics of the bilateral subclavian arteries.  He was last seen in our office in 06/2022 with stable symptoms and no medication changes  indicated at that time.  Since that visit, he has been followed by the [facility] CHF clinic.  He comes in and is without symptoms of angina or cardiac decompensation.  He has improved his diet since he was last seen with a weight that is down 13 pounds by our scale when compared to his last visit.  He indicates this was actually lower, though  more recently has began to regain some weight and feels like he is holding onto some fluid with some abdominal distention and lower extremity swelling.  He remains on torsemide 40 mg every other day.  He has been watching his salt intake.  At times he does drink greater than 2 L of liquid.  He also reports an episode of blood sugars in the 70s.  Chronic dizziness persists and is largely unchanged and associated with looking upwards, or rotational head movement.  Dizziness will last for several seconds and resolve.  He has been told he has bulging disks in his cervical spine.   Labs independently reviewed: 12/2022 - A1c 5.8, BUN 34, serum creatinine 1.66, potassium 4.4 09/2022 - albumin 4.6, TC 130, TG 212, HDL 32, LDL 63, TSH normal, AST/ALT normal, Hgb 14.3, PLT 156  Past Medical History:  Diagnosis Date   Anginal pain (HCC)    Arthritis    BPH (benign prostatic hyperplasia)    CHF (congestive heart failure) (HCC)    Coronary artery disease 2010   a.) LHC 2010: high grade RCA stenosis -> 2.5 x 26mm Cypher DES to RCA. b.) NSTEMI 2016 -> PCI revealed pat RCA stent; sig dLM Dz with FFR ratio 0.76 and oLCx stenosis; ref to CVTS. c.) 2v CABG 04/21/2015; LIMA-LAD, SVG-OM2. d.) [facility] 05/02/2018: EF 55-65%; LVEDP 14-15 mmHg; 10% p-m RCA, 30% pRCA, 20% dRCA, 95% o-pLCx, 80% m-dLM; LIMA graft pat. SVG graft with mild diffuse Dz.   DDD (degenerative disc disease), lumbar    Dyspnea    GERD (gastroesophageal reflux disease)    Hypercholesteremia    Hypertension    Left foot drop    NSTEMI (non-ST elevated myocardial infarction) (HCC) 2016   a.) PCI revealed patent RCA stent; significant dLM disease with FFR ratio 0.76 and oLCx stenosis; refer to CVTS for CABG.   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   a.) 2v CABG; LIMA-LAD, SVG-OM2   T2DM (type 2 diabetes mellitus) (HCC)     Past Surgical History:  Procedure Laterality Date   APPENDECTOMY     BACK SURGERY  11/01/2017    SL 5 and S1   CARDIAC CATHETERIZATION     CARPAL TUNNEL RELEASE     left hand   CHOLECYSTECTOMY     CHOLECYSTECTOMY     COLONOSCOPY WITH PROPOFOL N/A 10/28/2022   Procedure: COLONOSCOPY WITH PROPOFOL;  Surgeon: [name], MD;  Location: [facility] ENDOSCOPY;  Service: Gastroenterology;  Laterality: N/A;   CORONARY ARTERY BYPASS GRAFT  04/21/2015   CABG x 2 [facility] LIMA to LAD amd SVG to OM2   CORONARY STENT PLACEMENT  2010   Cordis Cypher Sirolimus-eluting stent 2.50 mm x 26 mm placed to the RCA at [facility]    ESOPHAGEAL MANOMETRY N/A 08/04/2017   Procedure: ESOPHAGEAL MANOMETRY (EM);  Surgeon: Toney Reil, MD;  Location: [facility] ENDOSCOPY;  Service: Endoscopy;  Laterality: N/A;   ESOPHAGOGASTRODUODENOSCOPY (EGD) WITH PROPOFOL N/A 06/17/2022   Procedure: ESOPHAGOGASTRODUODENOSCOPY (EGD) WITH PROPOFOL;  Surgeon: [name], MD;  Location: [facility] ENDOSCOPY;  Service: Gastroenterology;  Laterality: N/A;   FRACTURE SURGERY     KNEE SURGERY     KNEE SURGERY     right knee    LAMINECTOMY     "plate in neck Z6-X0"   LEFT HEART CATH AND CORS/GRAFTS ANGIOGRAPHY N/A 05/02/2018   Procedure: CORS/GRAFTS ANGIOGRAPHY;  Surgeon: Iran Ouch, MD;  Location: [facility] INVASIVE CV LAB;  Service: Cardiovascular;  Laterality: N/A;   RIGHT/LEFT HEART CATH AND CORONARY ANGIOGRAPHY Bilateral 05/02/2018   Procedure: LEFT HEART CATH;  Surgeon: Iran Ouch, MD;  Location: [facility] INVASIVE CV LAB;  Service: Cardiovascular;  Laterality: Bilateral;   THORACIC LAMINECTOMY FOR SPINAL CORD STIMULATOR N/A 05/26/2021   Procedure: THORACIC SPINAL CORD STIMULATOR AND PULSE GENERATOR PLACEMENT (MEDTRONIC);  Surgeon: Lucy Chris, MD;  Location: [facility] ORS;  Service: Neurosurgery;  Laterality: N/A;   VASECTOMY      Current Medications: Current Meds  Medication Sig   acetaminophen (TYLENOL) 500 MG tablet Take 1,000 mg by mouth every 8 (eight) hours as needed for mild pain or moderate pain.  Takes once a day   aspirin EC 81 MG tablet Take 81 mg by mouth daily. Swallow whole.   atorvastatin (LIPITOR) 40 MG tablet Take 1 tablet (40 mg total) by mouth daily.   bisacodyl (DULCOLAX) 5 MG EC tablet Take 5 mg by mouth daily as needed for moderate constipation.   Blood Glucose Monitoring Suppl (FREESTYLE LITE) DEVI To check blood sugar once daily   carvedilol (COREG) 12.5 MG tablet Take 12.5 mg by mouth 2 (two) times daily with a meal.   Cholecalciferol (VITAMIN D3) 50 MCG (2000 UT) capsule Take 2,000 Units by mouth daily.   clobetasol (TEMOVATE) 0.05 % external solution Apply 1 Application topically 2 (two) times daily.   cyanocobalamin (VITAMIN B12) 1000 MCG tablet Take 1,000 mcg by mouth daily.   cyclobenzaprine (FLEXERIL) 10 MG tablet TAKE 1 TABLET THREE TIMES A DAY AS NEEDED FOR MUSCLE SPASMS (Patient taking differently: Take 10 mg by mouth 2 (two) times daily.)   diazepam (VALIUM) 2 MG tablet Take 2 mg by mouth every 8 (eight) hours as needed.   empagliflozin (JARDIANCE) 10 MG TABS tablet Take 1 tablet (10 mg total) by mouth daily before breakfast.   ezetimibe (ZETIA) 10 MG tablet Take 1 tablet (10 mg total) by mouth daily.   FREESTYLE LITE test strip USE TO CHECK BLOOD SUGAR ONCE DAILY   indomethacin (INDOCIN) 50 MG capsule Take 50 mg by mouth daily as needed (gout flare). Taking once daily   isosorbide mononitrate (IMDUR) 60 MG 24 hr tablet TAKE 1 TABLET DAILY   ketoconazole (NIZORAL) 2 % shampoo APPLY 1 APPLICATION TOPICALLY 2 TIMES WEEKLY AS DIRECTED   Lancets (FREESTYLE) lancets USE TO CHECK BLOOD SUGAR ONCE DAILY   metFORMIN (GLUCOPHAGE-XR) 500 MG 24 hr tablet TAKE 2 TABLETS TWICE A DAY WITH MEALS   Multiple Vitamin (MULTIVITAMIN WITH MINERALS) TABS tablet Take 1 tablet by mouth daily.   nitroGLYCERIN (NITROSTAT) 0.4 MG SL tablet Place 1 tablet (0.4 mg total) under the tongue every 5 (five) minutes as needed for chest pain.   OZEMPIC, 0.25 OR 0.5 MG/DOSE, 2 MG/3ML SOPN INJECT  0.5 MG UNDER THE SKIN ONCE A WEEK (Patient taking differently: Inject 0.5 mg into the skin once a week. Sundays)   pantoprazole (PROTONIX) 40 MG tablet TAKE 1 TABLET DAILY   potassium chloride (KLOR-CON) 10 MEQ tablet TAKE 2 TABLETS TWICE A DAY   pregabalin (LYRICA)  300 MG capsule Take 1 capsule (300 mg total) by mouth 2 (two) times daily.   Pyrithione Zinc (DERMAZINC SHAMPOO) 2 % SHAM Apply 1 Dose topically as needed.   sacubitril-valsartan (ENTRESTO) 24-26 MG Take 1 tablet by mouth 2 (two) times daily.   torsemide (DEMADEX) 20 MG tablet Take 2 tablets (40 mg total) by mouth every other day. (Patient taking differently: Take 40 mg by mouth daily. Two tablets once daily plus one extra tablet daily as needed for swelling)   [DISCONTINUED] carvedilol (COREG) 25 MG tablet Take 1 tablet (25 mg total) by mouth 2 (two) times daily with a meal. (Patient taking differently: Take 25 mg by mouth 2 (two) times daily with a meal. Patient is currently taking 0.5 tablet twice a day.)    Allergies:   Colchicine and Lisinopril   Social History   Socioeconomic History   Marital status: Divorced    Spouse name: Not on file   Number of children: Not on file   Years of education: Not on file   Highest education level: Some college, no degree  Occupational History   Not on file  Tobacco Use   Smoking status: Former    Current packs/day: 0.00    Average packs/day: 0.3 packs/day for 2.0 years (0.5 ttl pk-yrs)    Types: Cigarettes    Start date: 05/27/2003    Quit date: 05/26/2005    Years since quitting: 17.7   Smokeless tobacco: Former    Types: Associate Professor status: Never Used  Substance and Sexual Activity   Alcohol use: No   Drug use: No   Sexual activity: Not on file  Other Topics Concern   Not on file  Social History Narrative   Lives at home with [name] (friend)    Social Determinants of Health   Financial Resource Strain: Low Risk  (12/27/2022)   Overall Financial Resource  Strain (CARDIA)    Difficulty of Paying Living Expenses: Not hard at all  Food Insecurity: No Food Insecurity (12/27/2022)   Hunger Vital Sign    Worried About Running Out of Food in the Last Year: Never true    Ran Out of Food in the Last Year: Never true  Transportation Needs: No Transportation Needs (12/27/2022)   PRAPARE - Administrator, Civil Service (Medical): No    Lack of Transportation (Non-Medical): No  Physical Activity: Insufficiently Active (12/27/2022)   Exercise Vital Sign    Days of Exercise per Week: 1 day    Minutes of Exercise per Session: 10 min  Stress: No Stress Concern Present (12/27/2022)   [employer] of Occupational Health - Occupational Stress Questionnaire    Feeling of Stress : Not at all  Social Connections: Moderately Isolated (12/27/2022)   Social Connection and Isolation Panel [NHANES]    Frequency of Communication with Friends and Family: Never    Frequency of Social Gatherings with Friends and Family: Never    Attends Religious Services: More than 4 times per year    Active Member of [employer] or Organizations: Yes    Attends Engineer, structural: More than 4 times per year    Marital Status: Divorced     Family History:  [name] family history includes Arthritis in his mother; Cancer in his maternal grandfather; Cataracts in his maternal grandmother; Glaucoma in his maternal grandmother; Heart attack in his paternal grandfather; Heart attack (age of onset: 62) in his father; Heart murmur in his  brother; Hyperlipidemia in his father; Hypertension in his father and paternal grandfather; Valvular heart disease in his brother. There is no history of Prostate cancer, Bladder Cancer, or Kidney cancer.  ROS:   12-point review of systems is negative unless otherwise noted in the HPI.   EKGs/Labs/Other Studies Reviewed:    Studies reviewed were summarized above. The additional studies were reviewed today:  R/LHC 04/2018: The  left ventricular systolic function is normal. LV end diastolic pressure is mildly elevated. The left ventricular ejection fraction is 55-65% by visual estimate. Prox RCA to Mid RCA lesion is 10% stenosed. Prox RCA lesion is 30% stenosed. Dist RCA lesion is 20% stenosed. SVG graft was visualized by angiography. The graft exhibits mild diffuse disease. Ost Cx to Prox Cx lesion is 95% stenosed. Mid LM to Dist LM lesion is 80% stenosed. LIMA and is normal in caliber. The graft exhibits no disease.   1. Significant underlying left main and RCA disease with patent grafts including LIMA to LAD and SVG to OM 2.  Patent RCA stent with minimal restenosis. 2.  Normal LV systolic function.  Mildly elevated left ventricular end-diastolic pressure at 14 to 15 mmHg.   Recommendations: I do not see a culprit for the patient symptoms. Even his LVEDP is only mildly elevated. Continue medical therapy for coronary artery disease and chronic diastolic heart failure. __________   Luci Bank 12/2018: 14-day ZIO patch monitor: Normal sinus rhythm with an average heart rate of 72 bpm.  Minimum heart rate was 50 bpm. 3 short runs of SVT. No significant arrhythmia to explain dizziness. __________   Eugenie Birks MPI 07/2019: Pharmacological myocardial perfusion imaging study with no significant ischemia Small fixed apical defect of mild severity likely secondary to attenuation artifact Normal wall motion, EF estimated at 52% No EKG changes concerning for ischemia at peak stress or in recovery. Low risk scan __________   2D echo 12/11/2021: 1. Left ventricular ejection fraction, by estimation, is 55 to 60%. The  left ventricle has normal function. The left ventricle has no regional  wall motion abnormalities. Left ventricular diastolic parameters are  consistent with Grade II diastolic  dysfunction (pseudonormalization).   2. Right ventricular systolic function is normal. The right ventricular  size is mildly  enlarged.   3. The mitral valve is normal in structure. No evidence of mitral valve  regurgitation.   4. The aortic valve is tricuspid. Aortic valve regurgitation is trivial.   5. The inferior vena cava is normal in size with greater than 50%  respiratory variability, suggesting right atrial pressure of 3 mmHg.   Comparison(s): Stress test:Oct 18, 2019 low risk study no significant  ischemia ejection fraction 52%. __________   Eugenie Birks MPI 02/26/2022: Pharmacological myocardial perfusion imaging study with no significant  ischemia Small region fixed defect in the inferolateral apical region, unable to exclude prior MI versus attenuation artifact GI uptake artifact noted Normal wall motion, EF estimated at 62% No EKG changes concerning for ischemia at peak stress or in recovery. CT attenuation correction images with three-vessel coronary calcification Low risk scan __________   Zio patch 01/2022: Normal sinus rhythm Patient had a min HR of 48 bpm, max HR of 118 bpm, and avg HR of 73 bpm.    2 Supraventricular Tachycardia runs occurred, the run with the fastest interval lasting 4 beats with a max rate of 118 bpm (avg 101 bpm); the run with the fastest interval was also the longest.    Isolated SVEs were frequent (8.0%, [id number]),  SVE Couplets were rare (<1.0%, 224), and SVE Triplets were rare (<1.0%, 29).  Isolated VEs were rare (<1.0%), and no VE Couplets or VE Triplets were present.    Triggered events associated with normal sinus rhythm, rare PAC.  No significant arrhythmia __________   Carotid artery ultrasound 03/17/2022: Right Carotid: The extracranial vessels were near-normal with only minimal  wall thickening or plaque.   Left Carotid: The extracranial vessels were near-normal with only minimal  wall thickening or plaque.   Vertebrals:  Bilateral vertebral arteries demonstrate antegrade flow.  Subclavians: Normal flow hemodynamics were seen in bilateral subclavian arteries.     EKG:  EKG is not ordered today.    Recent Labs: 09/30/2022: ALT 34; Hemoglobin 14.3; Platelets 156; TSH 2.430 12/31/2022: BUN 34; Creatinine, Ser 1.66; Potassium 4.4; Sodium 140  Recent Lipid Panel    Component Value Date/Time   CHOL 130 09/30/2022 1003   TRIG 212 (H) 09/30/2022 1003   HDL 32 (L) 09/30/2022 1003   CHOLHDL 4.1 09/30/2022 1003   CHOLHDL 3.9 11/06/2021 0948   VLDL UNABLE TO CALCULATE IF TRIGLYCERIDE OVER 400 mg/dL 95/62/1308 6578   LDLCALC 63 09/30/2022 1003   LDLDIRECT 34.8 11/06/2021 0948    PHYSICAL EXAM:    VS:  BP 104/68 (BP Location: Left Arm, Patient Position: Sitting, Cuff Size: Normal)   Pulse 76   Ht 5\' 7"  (1.702 m)   Wt 220 lb 12.8 oz (100.2 kg)   SpO2 94%   BMI 34.58 kg/m   BMI: Body mass index is 34.58 kg/m.  Physical Exam Vitals reviewed.  Constitutional:      Appearance: He is well-developed.  HENT:     Head: Normocephalic and atraumatic.  Eyes:     General:        Right eye: No discharge.        Left eye: No discharge.  Neck:     Vascular: No JVD.  Cardiovascular:     Rate and Rhythm: Normal rate and regular rhythm.     Heart sounds: Normal heart sounds, S1 normal and S2 normal. Heart sounds not distant. No midsystolic click and no opening snap. No murmur heard.    No friction rub.  Pulmonary:     Effort: Pulmonary effort is normal. No respiratory distress.     Breath sounds: Normal breath sounds. No decreased breath sounds, wheezing or rales.  Chest:     Chest wall: No tenderness.  Abdominal:     General: There is no distension.  Musculoskeletal:     Cervical back: Normal range of motion.     Right lower leg: No edema.     Left lower leg: No edema.  Skin:    General: Skin is warm and dry.     Nails: There is no clubbing.  Neurological:     Mental Status: He is alert and oriented to person, place, and time.  Psychiatric:        Speech: Speech normal.        Behavior: Behavior normal.        Thought Content: Thought  content normal.        Judgment: Judgment normal.     Wt Readings from Last 3 Encounters:  03/02/23 220 lb 12.8 oz (100.2 kg)  12/31/22 207 lb 3.2 oz (94 kg)  12/07/22 215 lb 12.8 oz (97.9 kg)     ASSESSMENT & PLAN:   CAD status post CABG: He is without symptoms of angina or cardiac decompensation.  Continue  aggressive risk factor modification and secondary prevention including aspirin, atorvastatin, carvedilol, and Imdur.  Lexiscan MPI approximately 1 year ago low risk.  No indication for further ischemic testing at this time.  HFpEF: His weight is down 13 pounds when compared to his last clinic visit, though indicates this is up from prior low weight in the setting of intentional weight loss.  He does feel like he is holding onto some extra fluid.  He reports fluid intake greater than 2 L at times.  In this setting, we will have him take torsemide 40 mg daily for 3 days followed by resumption of every other day dosing thereafter.  Remains on Entresto 24/26 mg twice daily and Jardiance.  Not on spironolactone secondary to underlying renal dysfunction.  HTN: Blood pressure is well-controlled in the office today.  Remains on carvedilol, Entresto, and Imdur.  HLD: LDL 63 in 09/2022 with normal AST/ALT at that time.  Remains on atorvastatin 40 mg.  Chronic dizziness with gait unsteadiness: Longstanding issue.  Cardiac workup has been unrevealing including multiple ischemic evaluations with R/LHC and Lexiscan MPI x 2, multiple cardiac monitors, echo, and carotid artery ultrasound.  Prior CTA head and MRI of the brain unrevealing.  Orthostatics previously negative.  No further cardiac testing indicated at this time with recommendation for patient to follow-up with PCP/neurology.  Obesity with sleep apnea: Continued weight loss is encouraged with a heart healthy diet.  Continue regular CPAP use.    Disposition: F/u with Dr. Mariah Milling in 6 months.   Medication Adjustments/Labs and Tests  Ordered: Current medicines are reviewed at length with the patient today.  Concerns regarding medicines are outlined above. Medication changes, Labs and Tests ordered today are summarized above and listed in the Patient Instructions accessible in Encounters.   Signed, Eula Listen, PA-C 03/02/2023 11:30 AM     [facility] [place] [address] [place], [place] [address] [phone]

## 2023-03-02 ENCOUNTER — Encounter: Payer: Self-pay | Admitting: Physician Assistant

## 2023-03-02 ENCOUNTER — Ambulatory Visit: Payer: Medicare PPO | Attending: Physician Assistant | Admitting: Physician Assistant

## 2023-03-02 VITALS — BP 104/68 | HR 76 | Ht 67.0 in | Wt 220.8 lb

## 2023-03-02 DIAGNOSIS — R42 Dizziness and giddiness: Secondary | ICD-10-CM | POA: Diagnosis not present

## 2023-03-02 DIAGNOSIS — G4730 Sleep apnea, unspecified: Secondary | ICD-10-CM | POA: Diagnosis not present

## 2023-03-02 DIAGNOSIS — I10 Essential (primary) hypertension: Secondary | ICD-10-CM

## 2023-03-02 DIAGNOSIS — I2510 Atherosclerotic heart disease of native coronary artery without angina pectoris: Secondary | ICD-10-CM | POA: Diagnosis not present

## 2023-03-02 DIAGNOSIS — E785 Hyperlipidemia, unspecified: Secondary | ICD-10-CM | POA: Diagnosis not present

## 2023-03-02 DIAGNOSIS — Z951 Presence of aortocoronary bypass graft: Secondary | ICD-10-CM

## 2023-03-02 DIAGNOSIS — I5032 Chronic diastolic (congestive) heart failure: Secondary | ICD-10-CM | POA: Diagnosis not present

## 2023-03-02 NOTE — Patient Instructions (Signed)
Medication Instructions:  Your physician recommends the following medication changes.  START TAKING: Torsemide take daily for 3 days and then return to alternating   *If you need a refill on your cardiac medications before your next appointment, please call your pharmacy*  Lab Work: None If you have labs (blood work) drawn today and your tests are completely normal, you will receive your results only by: MyChart Message (if you have MyChart) OR A paper copy in the mail If you have any lab test that is abnormal or we need to change your treatment, we will call you to review the results.  Testing/Procedures: None  Follow-Up: At [facility], you and your health needs are our priority.  As part of our continuing mission to provide you with exceptional heart care, we have created designated Provider Care Teams.  These Care Teams include your primary Cardiologist (physician) and Advanced Practice Providers (APPs -  Physician Assistants and Nurse Practitioners) who all work together to provide you with the care you need, when you need it.  We recommend signing up for the patient portal called "MyChart".  Sign up information is provided on this After Visit Summary.  MyChart is used to connect with patients for Virtual Visits (Telemedicine).  Patients are able to view lab/test results, encounter notes, upcoming appointments, etc.  Non-urgent messages can be sent to your provider as well.   To learn more about what you can do with MyChart, go to [email or web address].    Your next appointment:   6 month(s)  Provider:   You may see Julien Nordmann, MD

## 2023-03-04 DIAGNOSIS — H524 Presbyopia: Secondary | ICD-10-CM | POA: Diagnosis not present

## 2023-03-04 DIAGNOSIS — H04123 Dry eye syndrome of bilateral lacrimal glands: Secondary | ICD-10-CM | POA: Diagnosis not present

## 2023-03-04 DIAGNOSIS — H2513 Age-related nuclear cataract, bilateral: Secondary | ICD-10-CM | POA: Diagnosis not present

## 2023-03-04 DIAGNOSIS — E119 Type 2 diabetes mellitus without complications: Secondary | ICD-10-CM | POA: Diagnosis not present

## 2023-03-10 LAB — HM DIABETES EYE EXAM

## 2023-03-11 DIAGNOSIS — I10 Essential (primary) hypertension: Secondary | ICD-10-CM | POA: Diagnosis not present

## 2023-03-11 DIAGNOSIS — N2581 Secondary hyperparathyroidism of renal origin: Secondary | ICD-10-CM | POA: Diagnosis not present

## 2023-03-11 DIAGNOSIS — N182 Chronic kidney disease, stage 2 (mild): Secondary | ICD-10-CM | POA: Diagnosis not present

## 2023-03-11 DIAGNOSIS — N1831 Chronic kidney disease, stage 3a: Secondary | ICD-10-CM | POA: Diagnosis not present

## 2023-03-11 DIAGNOSIS — I129 Hypertensive chronic kidney disease with stage 1 through stage 4 chronic kidney disease, or unspecified chronic kidney disease: Secondary | ICD-10-CM | POA: Diagnosis not present

## 2023-03-15 ENCOUNTER — Ambulatory Visit (INDEPENDENT_AMBULATORY_CARE_PROVIDER_SITE_OTHER): Payer: Medicare PPO | Admitting: Family Medicine

## 2023-03-15 ENCOUNTER — Encounter: Payer: Self-pay | Admitting: Family Medicine

## 2023-03-15 VITALS — BP 107/71 | HR 74 | Ht 67.0 in | Wt 216.3 lb

## 2023-03-15 DIAGNOSIS — M25552 Pain in left hip: Secondary | ICD-10-CM

## 2023-03-15 DIAGNOSIS — L409 Psoriasis, unspecified: Secondary | ICD-10-CM

## 2023-03-15 DIAGNOSIS — M51379 Other intervertebral disc degeneration, lumbosacral region without mention of lumbar back pain or lower extremity pain: Secondary | ICD-10-CM

## 2023-03-15 DIAGNOSIS — M5416 Radiculopathy, lumbar region: Secondary | ICD-10-CM

## 2023-03-15 DIAGNOSIS — G8929 Other chronic pain: Secondary | ICD-10-CM

## 2023-03-15 DIAGNOSIS — M5137 Other intervertebral disc degeneration, lumbosacral region: Secondary | ICD-10-CM

## 2023-03-15 DIAGNOSIS — M25551 Pain in right hip: Secondary | ICD-10-CM

## 2023-03-15 MED ORDER — KETOCONAZOLE 2 % EX SHAM
MEDICATED_SHAMPOO | CUTANEOUS | 3 refills | Status: DC
Start: 2023-03-15 — End: 2023-07-26

## 2023-03-15 MED ORDER — METHYLPREDNISOLONE 4 MG PO TBPK: ORAL_TABLET | ORAL | 0 refills | Status: DC

## 2023-03-15 NOTE — Assessment & Plan Note (Signed)
Acute on chronic, worsening hip pain Request to see ortho for surgical consult

## 2023-03-15 NOTE — Assessment & Plan Note (Signed)
Acute on chronic, in the setting of worsening hip pain Request to see ortho for surgical consult Trial of steroids to assist with known CKD; continue tylenol, hydration, stretching, icing, rest etc

## 2023-03-15 NOTE — Assessment & Plan Note (Signed)
Chronic, request for refills

## 2023-03-15 NOTE — Progress Notes (Signed)
Established patient visit  Patient: Jonathon Newman   DOB: 1955/07/10   67 y.o. Male  MRN: [id number] Visit Date: 03/15/2023  Today's healthcare provider: Jacky Kindle, FNP  Introduced to nurse practitioner role and practice setting.  All questions answered.  Discussed provider/patient relationship and expectations.  Subjective    HPI HPI     Hip Pain    Additional comments: Present 3 months X bilateral but mainly in his left. Patient reports taking tylenol and upped his stimulator. Previously advised of bursitis in his hip.       Last edited by Acey Lav, CMA on 03/15/2023  1:12 PM.      Acute on chronic hip pain; left >right  Medications: Outpatient Medications Prior to Visit  Medication Sig   acetaminophen (TYLENOL) 500 MG tablet Take 1,000 mg by mouth every 8 (eight) hours as needed for mild pain or moderate pain. Takes once a day   aspirin EC 81 MG tablet Take 81 mg by mouth daily. Swallow whole.   atorvastatin (LIPITOR) 40 MG tablet Take 1 tablet (40 mg total) by mouth daily.   bisacodyl (DULCOLAX) 5 MG EC tablet Take 5 mg by mouth daily as needed for moderate constipation.   Blood Glucose Monitoring Suppl (FREESTYLE LITE) DEVI To check blood sugar once daily   carvedilol (COREG) 12.5 MG tablet Take 12.5 mg by mouth 2 (two) times daily with a meal.   Cholecalciferol (VITAMIN D3) 50 MCG (2000 UT) capsule Take 2,000 Units by mouth daily.   clobetasol (TEMOVATE) 0.05 % external solution Apply 1 Application topically 2 (two) times daily.   cyanocobalamin (VITAMIN B12) 1000 MCG tablet Take 1,000 mcg by mouth daily.   cyclobenzaprine (FLEXERIL) 10 MG tablet TAKE 1 TABLET THREE TIMES A DAY AS NEEDED FOR MUSCLE SPASMS (Patient taking differently: Take 10 mg by mouth 2 (two) times daily.)   diazepam (VALIUM) 2 MG tablet Take 2 mg by mouth every 8 (eight) hours as needed.   empagliflozin (JARDIANCE) 10 MG TABS tablet Take 1 tablet (10 mg total) by mouth daily before  breakfast.   ezetimibe (ZETIA) 10 MG tablet Take 1 tablet (10 mg total) by mouth daily.   FREESTYLE LITE test strip USE TO CHECK BLOOD SUGAR ONCE DAILY   indomethacin (INDOCIN) 50 MG capsule Take 50 mg by mouth daily as needed (gout flare). Taking once daily   isosorbide mononitrate (IMDUR) 60 MG 24 hr tablet TAKE 1 TABLET DAILY   Lancets (FREESTYLE) lancets USE TO CHECK BLOOD SUGAR ONCE DAILY   metFORMIN (GLUCOPHAGE-XR) 500 MG 24 hr tablet TAKE 2 TABLETS TWICE A DAY WITH MEALS   Multiple Vitamin (MULTIVITAMIN WITH MINERALS) TABS tablet Take 1 tablet by mouth daily.   nitroGLYCERIN (NITROSTAT) 0.4 MG SL tablet Place 1 tablet (0.4 mg total) under the tongue every 5 (five) minutes as needed for chest pain.   OZEMPIC, 0.25 OR 0.5 MG/DOSE, 2 MG/3ML SOPN INJECT 0.5 MG UNDER THE SKIN ONCE A WEEK (Patient taking differently: Inject 0.5 mg into the skin once a week. Sundays)   pantoprazole (PROTONIX) 40 MG tablet TAKE 1 TABLET DAILY   potassium chloride (KLOR-CON) 10 MEQ tablet TAKE 2 TABLETS TWICE A DAY   pregabalin (LYRICA) 300 MG capsule Take 1 capsule (300 mg total) by mouth 2 (two) times daily.   sacubitril-valsartan (ENTRESTO) 24-26 MG Take 1 tablet by mouth 2 (two) times daily.   torsemide (DEMADEX) 20 MG tablet Take 2 tablets (40 mg total) by mouth every  other day. (Patient taking differently: Take 40 mg by mouth daily. Two tablets once daily plus one extra tablet daily as needed for swelling)   Pyrithione Zinc (DERMAZINC SHAMPOO) 2 % SHAM Apply 1 Dose topically as needed. (Patient not taking: Reported on 03/15/2023)   [DISCONTINUED] ketoconazole (NIZORAL) 2 % shampoo APPLY 1 APPLICATION TOPICALLY 2 TIMES WEEKLY AS DIRECTED (Patient not taking: Reported on 03/15/2023)   No facility-administered medications prior to visit.     Objective    BP 107/71 (BP Location: Right Arm, Patient Position: Sitting, Cuff Size: Large)   Pulse 74   Ht 5\' 7"  (1.702 m)   Wt 216 lb 4.8 oz (98.1 kg)   SpO2 99%    BMI 33.88 kg/m   Physical Exam Vitals and nursing note reviewed.  Constitutional:      Appearance: Normal appearance. He is obese. He is ill-appearing.  HENT:     Head: Normocephalic and atraumatic.  Cardiovascular:     Rate and Rhythm: Normal rate and regular rhythm.     Pulses: Normal pulses.     Heart sounds: Normal heart sounds.  Pulmonary:     Effort: Pulmonary effort is normal.     Breath sounds: Normal breath sounds.  Musculoskeletal:        General: Tenderness present.     Cervical back: Normal range of motion.     Comments: Decreased ROM in bilateral hips; R hip is more tender to touch; however, L hip with more orthopedic concerns Request to see specialist   Skin:    General: Skin is warm and dry.     Capillary Refill: Capillary refill takes less than 2 seconds.  Neurological:     General: No focal deficit present.     Mental Status: He is alert and oriented to person, place, and time. Mental status is at baseline.     Motor: Weakness present.     Gait: Gait abnormal.     Comments: Use of cane given acute on chronic hip pain  Psychiatric:        Mood and Affect: Mood normal.        Behavior: Behavior normal.        Thought Content: Thought content normal.        Judgment: Judgment normal.     Results for orders placed or performed in visit on 03/15/23  HM DIABETES EYE EXAM  Result Value Ref Range   HM Diabetic Eye Exam No Retinopathy No Retinopathy    Assessment & Plan     Problem List Items Addressed This Visit       Nervous and Auditory   Lumbar radiculopathy    Acute on chronic, worsening hip pain Request to see ortho for surgical consult        Musculoskeletal and Integument   DDD (degenerative disc disease), lumbosacral    Acute on chronic, in the setting of worsening hip pain Request to see ortho for surgical consult Trial of steroids to assist with known CKD; continue tylenol, hydration, stretching, icing, rest etc      Relevant  Medications   methylPREDNISolone (MEDROL DOSEPAK) 4 MG TBPK tablet   Psoriasis of scalp    Chronic, request for refills      Relevant Medications   ketoconazole (NIZORAL) 2 % shampoo     Other   Chronic left hip pain - Primary    Acute on chronic, worsening hip pain Request to see ortho for surgical consult  Relevant Medications   methylPREDNISolone (MEDROL DOSEPAK) 4 MG TBPK tablet   Other Relevant Orders   Ambulatory referral to Orthopedic Surgery   Chronic right hip pain    Acute on chronic, worsening hip pain Request to see ortho for surgical consult      Relevant Medications   methylPREDNISolone (MEDROL DOSEPAK) 4 MG TBPK tablet    No follow-ups on file.      Leilani Merl, FNP, have reviewed all documentation for this visit. The documentation on 03/15/23 for the exam, diagnosis, procedures, and orders are all accurate and complete.  Jacky Kindle, FNP  [facility] [phone] (phone) [phone] (fax)  [facility]

## 2023-03-29 DIAGNOSIS — M5116 Intervertebral disc disorders with radiculopathy, lumbar region: Secondary | ICD-10-CM | POA: Diagnosis not present

## 2023-03-29 DIAGNOSIS — G8929 Other chronic pain: Secondary | ICD-10-CM | POA: Diagnosis not present

## 2023-03-29 DIAGNOSIS — M25552 Pain in left hip: Secondary | ICD-10-CM | POA: Diagnosis not present

## 2023-03-29 DIAGNOSIS — M25551 Pain in right hip: Secondary | ICD-10-CM | POA: Diagnosis not present

## 2023-03-30 ENCOUNTER — Other Ambulatory Visit: Payer: Self-pay | Admitting: Family

## 2023-03-30 DIAGNOSIS — I5032 Chronic diastolic (congestive) heart failure: Secondary | ICD-10-CM

## 2023-04-30 ENCOUNTER — Other Ambulatory Visit: Payer: Self-pay | Admitting: Cardiovascular Disease

## 2023-04-30 ENCOUNTER — Other Ambulatory Visit: Payer: Self-pay | Admitting: Family Medicine

## 2023-04-30 ENCOUNTER — Other Ambulatory Visit: Payer: Self-pay | Admitting: Family

## 2023-04-30 DIAGNOSIS — E1151 Type 2 diabetes mellitus with diabetic peripheral angiopathy without gangrene: Secondary | ICD-10-CM

## 2023-05-03 NOTE — Telephone Encounter (Signed)
Patient will need a follow up appointment for further refills. Requested Prescriptions  Pending Prescriptions Disp Refills   metFORMIN (GLUCOPHAGE-XR) 500 MG 24 hr tablet [Pharmacy Med Name: METFORMIN HCL ER TABS 500MG ] 360 tablet 0    Sig: TAKE 2 TABLETS TWICE A DAY WITH MEALS     Endocrinology:  Diabetes - Biguanides Failed - 04/30/2023  1:07 PM      Failed - Cr in normal range and within 360 days    Creatinine, Ser  Date Value Ref Range Status  12/31/2022 1.66 (H) 0.76 - 1.27 mg/dL Final         Failed - eGFR in normal range and within 360 days    GFR calc Af Amer  Date Value Ref Range Status  06/28/2020 72 >59 mL/min/1.73 Final    Comment:    **In accordance with recommendations from the NKF-ASN Task force,**   [facility] is in the process of updating its eGFR calculation to the   2021 CKD-EPI creatinine equation that estimates kidney function   without a race variable.    GFR, Estimated  Date Value Ref Range Status  02/10/2022 >60 >60 mL/min Final    Comment:    (NOTE) Calculated using the CKD-EPI Creatinine Equation (2021)    eGFR  Date Value Ref Range Status  12/31/2022 45 (L) >59 mL/min/1.73 Final         Passed - HBA1C is between 0 and 7.9 and within 180 days    Hgb A1c MFr Bld  Date Value Ref Range Status  12/31/2022 5.8 (H) 4.8 - 5.6 % Final    Comment:             Prediabetes: 5.7 - 6.4          Diabetes: >6.4          Glycemic control for adults with diabetes: <7.0          Passed - B12 Level in normal range and within 720 days    Vitamin B-12  Date Value Ref Range Status  09/30/2022 595 232 - 1,245 pg/mL Final         Passed - Valid encounter within last 6 months    Recent Outpatient Visits           1 month ago Chronic left hip pain   [facility] Merita Norton T, FNP   4 months ago Type 2 diabetes mellitus with diabetic peripheral angiopathy without gangrene, without long-term current use of insulin [facility])   [facility] Merita Norton T, FNP   4 months ago No-show for appointment   [facility] Merita Norton T, FNP   1 year ago AKI (acute kidney injury) [facility])   [facility] Merita Norton T, FNP   1 year ago Chronic diastolic CHF (congestive heart failure) [facility])   [facility] Merita Norton T, FNP       Future Appointments             In 4 months Gollan, Tollie Pizza, MD [facility] - CBC within normal limits and completed in the last 12 months    WBC  Date Value Ref Range Status  09/30/2022 5.4 3.4 - 10.8 x10E3/uL Final  01/06/2022 6.0 4.0 - 10.5 K/uL Final   RBC  Date Value Ref Range Status  09/30/2022 4.56  4.14 - 5.80 x10E6/uL Final  01/06/2022 3.89 (L) 4.22 - 5.81 MIL/uL Final   Hemoglobin  Date Value Ref Range Status  09/30/2022 14.3 13.0 - 17.7 g/dL Final   Hematocrit  Date Value Ref Range Status  09/30/2022 41.1 37.5 - 51.0 % Final   MCHC  Date Value Ref Range Status  09/30/2022 34.8 31.5 - 35.7 g/dL Final  08/65/7846 96.2 30.0 - 36.0 g/dL Final   [facility]  Date Value Ref Range Status  09/30/2022 31.4 26.6 - 33.0 pg Final  01/06/2022 30.8 26.0 - 34.0 pg Final   MCV  Date Value Ref Range Status  09/30/2022 90 79 - 97 fL Final   No results found for: "PLTCOUNTKUC", "LABPLAT", "POCPLA" RDW  Date Value Ref Range Status  09/30/2022 12.6 11.6 - 15.4 % Final

## 2023-05-06 ENCOUNTER — Other Ambulatory Visit: Payer: Self-pay | Admitting: Family Medicine

## 2023-05-31 DIAGNOSIS — M21372 Foot drop, left foot: Secondary | ICD-10-CM | POA: Diagnosis not present

## 2023-05-31 DIAGNOSIS — R42 Dizziness and giddiness: Secondary | ICD-10-CM | POA: Diagnosis not present

## 2023-05-31 DIAGNOSIS — R202 Paresthesia of skin: Secondary | ICD-10-CM | POA: Diagnosis not present

## 2023-05-31 DIAGNOSIS — R200 Anesthesia of skin: Secondary | ICD-10-CM | POA: Diagnosis not present

## 2023-05-31 DIAGNOSIS — R29818 Other symptoms and signs involving the nervous system: Secondary | ICD-10-CM | POA: Diagnosis not present

## 2023-06-01 ENCOUNTER — Other Ambulatory Visit: Payer: Self-pay | Admitting: Neurology

## 2023-06-01 DIAGNOSIS — R42 Dizziness and giddiness: Secondary | ICD-10-CM

## 2023-06-03 ENCOUNTER — Encounter: Payer: Self-pay | Admitting: Neurology

## 2023-06-07 ENCOUNTER — Inpatient Hospital Stay: Admission: RE | Admit: 2023-06-07 | Payer: Medicare PPO | Source: Ambulatory Visit

## 2023-06-10 ENCOUNTER — Ambulatory Visit
Admission: RE | Admit: 2023-06-10 | Discharge: 2023-06-10 | Disposition: A | Discharge status: Home / Self Care | Payer: Medicare PPO | Source: Ambulatory Visit | Attending: Neurology | Admitting: Neurology

## 2023-06-10 DIAGNOSIS — R42 Dizziness and giddiness: Secondary | ICD-10-CM | POA: Diagnosis not present

## 2023-06-10 DIAGNOSIS — R202 Paresthesia of skin: Secondary | ICD-10-CM | POA: Diagnosis not present

## 2023-06-11 NOTE — Progress Notes (Unsigned)
PCP: Jonathon Kindle, FNP (last seen 09/24) Primary Cardiologist: Jonathon Nordmann, MD/ Jonathon Listen, PA (last seen 09/24)  Chief Complaint: shortness of breath.    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   He is retired from [employer] though is no longer eligible for his care through the [employer]. He previously underwent Cypher drug-eluting stent to the RCA in 2010. This was followed by a NSTEMI in 2016 with cardiac cath showing a patent RCA stent with significant distal left main disease with an FFR of 0.76 and ostial LCx stenosis. He underwent 2-vessel CABG with a LIMA to LAD and SVG to OM2 in 2016. He was seen in 06/2017 for atypical chest pain and exertional dyspnea. He underwent Lexiscan Myoview on 07/08/2017 that showed no evidence of ischemia with a normal EF.  He underwent diagnostic R/LHC on 05/02/2018 that showed significant underlying left main disease with patent LIMA to LAD and SVG to OM2. The RCA stent was patent with minimal restenosis. He had normal LVSF with a mildly elevated LVEDP at 14-15 mmHg. There was no cardiac culprit for his symptoms based on this cath.   Echo 12/11/21: EF of 55-60%  LHC 05/02/18:  The left ventricular systolic function is normal. LV end diastolic pressure is mildly elevated. The left ventricular ejection fraction is 55-65% by visual estimate. Prox RCA to Mid RCA lesion is 10% stenosed. Prox RCA lesion is 30% stenosed. Dist RCA lesion is 20% stenosed. SVG graft was visualized by angiography. The graft exhibits mild diffuse disease. Ost Cx to Prox Cx lesion is 95% stenosed. Mid LM to Dist LM lesion is 80% stenosed. LIMA and is normal in caliber. The graft exhibits no disease.  1.  Significant underlying left main and RCA disease with patent grafts including LIMA to LAD and SVG to OM 2.  Patent RCA stent with minimal restenosis. 2.  Normal LV systolic function.  Mildly elevated left ventricular end-diastolic pressure at 14 to 15 mmHg.  He presents today for a HF visit with a chief complaint of moderate shortness of breath with minimal exertion. He did have to sit in a chair downstairs after walking from parking lot due to his shortness of breath. Has associated fatigue, intermittent chest pain, constant dizziness (worse with looking up) and chronic bilateral hip pain (bursitis) along with this. Wearing his CPAP nightly. Denies palpitations, abdominal distention or pedal edema. Voices frustration that the cause of his dizziness can't be figured out. Currently being followed by neurology regarding his dizziness.   Previous cardiac studies:  Lexiscan MPI in 10/2019, showed no evidence of ischemia with an EF of 52%  Lexiscan MPI in 02/2022 showed no significant ischemia with an EF of 62% and was overall low risk   Carotid artery ultrasound in 03/2022 showed near normal bilateral internal carotid arteries with anterograde flow of the bilateral vertebral arteries and normal flow hemodynamics of the bilateral subclavian arteries.   ROS: All systems negative except as listed in HPI, PMH and Problem List.  SH:  Social History   Socioeconomic History   Marital status: Divorced    Spouse name: Not on file   Number of children: Not on file   Years of  education: Not on file   Highest education level: Some college, no degree  Occupational History   Not on file  Tobacco Use   Smoking status: Former    Current packs/day: 0.00    Average packs/day: 0.3 packs/day for 2.0 years (0.5 ttl pk-yrs)    Types: Cigarettes    Start date: 05/27/2003    Quit date: 05/26/2005    Years since quitting: 18.0   Smokeless tobacco: Former    Types: Associate Professor status: Never Used   Substance and Sexual Activity   Alcohol use: No   Drug use: No   Sexual activity: Not on file  Other Topics Concern   Not on file  Social History Narrative   Lives at home with Jonathon Newman (friend)    Social Drivers of Corporate investment banker Strain: Low Risk  (12/27/2022)   Overall Financial Resource Strain (CARDIA)    Difficulty of Paying Living Expenses: Not hard at all  Food Insecurity: No Food Insecurity (12/27/2022)   Hunger Vital Sign    Worried About Running Out of Food in the Last Year: Never true    Ran Out of Food in the Last Year: Never true  Transportation Needs: No Transportation Needs (12/27/2022)   PRAPARE - Administrator, Civil Service (Medical): No    Lack of Transportation (Non-Medical): No  Physical Activity: Insufficiently Active (12/27/2022)   Exercise Vital Sign    Days of Exercise per Week: 1 day    Minutes of Exercise per Session: 10 min  Stress: No Stress Concern Present (12/27/2022)   [employer] of Occupational Health - Occupational Stress Questionnaire    Feeling of Stress : Not at all  Social Connections: Moderately Isolated (12/27/2022)   Social Connection and Isolation Panel [NHANES]    Frequency of Communication with Friends and Family: Never    Frequency of Social Gatherings with Friends and Family: Never    Attends Religious Services: More than 4 times per year    Active Member of [employer] or Organizations: Yes    Attends Engineer, structural: More than 4 times per year    Marital Status: Divorced  Catering manager Violence: Not on file    FH:  Family History  Problem Relation Age of Onset   Arthritis Mother    Hyperlipidemia Father    Hypertension Father    Heart attack Father 2   Heart murmur Brother    Valvular heart disease Brother    Cataracts Maternal Grandmother    Glaucoma Maternal Grandmother    Cancer Maternal Grandfather    Heart attack Paternal Grandfather    Hypertension Paternal Grandfather     Prostate cancer Neg Hx    Bladder Cancer Neg Hx    Kidney cancer Neg Hx     Past Medical History:  Diagnosis Date   Anginal pain (HCC)    Arthritis    BPH (benign prostatic hyperplasia)    CHF (congestive heart failure) (HCC)    Coronary artery disease 2010   a.) LHC 2010: high grade RCA stenosis -> 2.5 x 26mm Cypher DES to RCA. b.) NSTEMI 2016 -> PCI revealed pat RCA stent; sig dLM Dz with FFR ratio 0.76 and oLCx stenosis; ref to CVTS. c.) 2v CABG 04/21/2015; LIMA-LAD, SVG-OM2. d.) [facility] 05/02/2018: EF 55-65%; LVEDP 14-15 mmHg; 10% p-m RCA, 30% pRCA, 20% dRCA, 95% o-pLCx, 80% m-dLM; LIMA graft pat. SVG graft with mild diffuse Dz.   DDD (degenerative  disc disease), lumbar    Dyspnea    GERD (gastroesophageal reflux disease)    Hypercholesteremia    Hypertension    Left foot drop    NSTEMI (non-ST elevated myocardial infarction) (HCC) 2016   a.) PCI revealed patent RCA stent; significant dLM disease with FFR ratio 0.76 and oLCx stenosis; refer to CVTS for CABG.   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 Takes once a day     aspirin EC 81 MG tablet Take 81 mg by mouth daily. Swallow whole.     atorvastatin (LIPITOR) 40 MG tablet Take 1 tablet (40 mg total) by mouth daily. 90 tablet 3   bisacodyl (DULCOLAX) 5 MG EC tablet Take 5 mg by mouth daily as needed for moderate constipation.     Blood Glucose Monitoring Suppl (FREESTYLE LITE) DEVI To check blood sugar once daily 1 each 0   carvedilol (COREG) 12.5 MG tablet Take 12.5 mg by mouth 2 (two) times daily with a meal.     Cholecalciferol (VITAMIN D3) 50 MCG (2000 UT) capsule Take 2,000 Units by mouth daily.     clobetasol (TEMOVATE) 0.05 %  external solution Apply 1 Application topically 2 (two) times daily. 50 mL 11   cyanocobalamin (VITAMIN B12) 1000 MCG tablet Take 1,000 mcg by mouth daily.     cyclobenzaprine (FLEXERIL) 10 MG tablet TAKE 1 TABLET THREE TIMES A DAY AS NEEDED FOR MUSCLE SPASMS (Patient taking differently: Take 10 mg by mouth 2 (two) times daily.) 90 tablet 11   diazepam (VALIUM) 2 MG tablet Take 2 mg by mouth every 8 (eight) hours as needed.     empagliflozin (JARDIANCE) 10 MG TABS tablet TAKE 1 TABLET DAILY BEFORE BREAKFAST 30 tablet 5   ezetimibe (ZETIA) 10 MG tablet TAKE 1 TABLET DAILY 90 tablet 3   FREESTYLE LITE test strip USE TO CHECK BLOOD SUGAR ONCE DAILY 100 strip 3   indomethacin (INDOCIN) 50 MG capsule Take 50 mg by mouth daily as needed (gout flare). Taking once daily     isosorbide mononitrate (IMDUR) 60 MG 24 hr tablet TAKE 1 TABLET DAILY 90 tablet 3   ketoconazole (NIZORAL) 2 % shampoo Apply topically 2 (two) times a week. 120 mL 3   Lancets (FREESTYLE) lancets USE TO CHECK BLOOD SUGAR ONCE DAILY 100 each 4   metFORMIN (GLUCOPHAGE-XR) 500 MG 24 hr tablet TAKE 2 TABLETS TWICE A DAY WITH MEALS 360 tablet 0   methylPREDNISolone (MEDROL DOSEPAK) 4 MG TBPK tablet Take as directed on package with food/milk. Best taken in morning. 1 each 0   Multiple Vitamin (MULTIVITAMIN WITH MINERALS) TABS tablet Take 1 tablet by mouth daily.     nitroGLYCERIN (NITROSTAT) 0.4 MG SL tablet DISSOLVE 1 TABLET UNDER THE TONGUE EVERY 5 MINUTES AS NEEDED FOR CHEST PAIN 25 tablet 5   OZEMPIC, 0.25 OR 0.5 MG/DOSE, 2 MG/3ML SOPN INJECT 0.5 MG UNDER THE SKIN ONCE A WEEK (Patient taking differently: Inject 0.5 mg into the skin once a week. Sundays) 9 mL 3   pantoprazole (PROTONIX) 40 MG tablet TAKE 1 TABLET DAILY 90 tablet 3   potassium chloride (KLOR-CON) 10 MEQ tablet  TAKE 2 TABLETS TWICE A DAY 360 tablet 3   pregabalin (LYRICA) 300 MG capsule Take 1 capsule (300 mg total) by mouth 2 (two) times daily. 200 capsule 0   Pyrithione  Zinc (DERMAZINC SHAMPOO) 2 % SHAM Apply 1 Dose topically as needed. (Patient not taking: Reported on 03/15/2023) 480 mL 0   sacubitril-valsartan (ENTRESTO) 24-26 MG Take 1 tablet by mouth 2 (two) times daily.     torsemide (DEMADEX) 20 MG tablet Take 2 tablets (40 mg total) by mouth every other day. (Patient taking differently: Take 40 mg by mouth daily. Two tablets once daily plus one extra tablet daily as needed for swelling)     No current facility-administered medications for this visit.   Vitals:   06/14/23 0836  BP: 123/79  Pulse: 74  Resp: 18  SpO2: 100%  Weight: 215 lb 4 oz (97.6 kg)   Wt Readings from Last 3 Encounters:  06/14/23 215 lb 4 oz (97.6 kg)  03/15/23 216 lb 4.8 oz (98.1 kg)  03/02/23 220 lb 12.8 oz (100.2 kg)   Lab Results  Component Value Date   CREATININE 1.66 (H) 12/31/2022   CREATININE 1.97 (H) 09/30/2022   CREATININE 1.29 (H) 02/10/2022    PHYSICAL EXAM:  General:  Well appearing. No resp difficulty HEENT: normal Neck: supple. JVP flat. No lymphadenopathy or thryomegaly appreciated. Cor: PMI normal. Regular rate & rhythm. No rubs, gallops or murmurs. Lungs: clear Abdomen: soft, nontender, nondistended. No hepatosplenomegaly. No bruits or masses.  Extremities: no cyanosis, clubbing, rash, edema Neuro: alert & oriented x3, cranial nerves grossly intact. Moves all 4 extremities w/o difficulty. Affect pleasant.  ECG: not done   ASSESSMENT & PLAN:  1: Ischemic heart failure with preserved ejection fraction- - Etiology: CAD w/ previous CABG - NYHA class III - euvolemic today - weighing daily; reminded to call for an overnight weight gain of > 2 pounds or a weekly weight gain of > 5 pounds - weight unchanged from last visit here 6 months ago - Echo 12/11/21: EF of 55-60%; will update this at next visit - will get updated echo scheduled - continue carvedilol 12.5mg  BID - continue jardiance 10mg  daily - continue entresto 49/51mg  BID - continue  torsemide 40mg  every other day - using NoSalt although will use regular salt on watermelon/ cantaloupe - knows to keep daily fluid intake to 64 ounces but says that he's drinking way more than that due to his dry mouth; says that he can drink up to 120 oz daily - BNP 01/06/22 was 73.0  2: HTN- - BP 123/79 - saw PCP Suzie Portela) 09/24 - saw nephrology Thedore Mins) 09/24 - BMP 03/11/23 reviewed and showed sodium 140, potassium 4.9, creatinine 1.24 and GFR 64  3: DM- - A1c 12/31/22 was 5.8% - home glucose ranges from 92-170  4: CAD- - saw cardiology (Dunn) 09/24 - LHC 05/02/18:  The left ventricular systolic function is normal. LV end diastolic pressure is mildly elevated. The left ventricular ejection fraction is 55-65% by visual estimate. Prox RCA to Mid RCA lesion is 10% stenosed. Prox RCA lesion is 30% stenosed. Dist RCA lesion is 20% stenosed. SVG graft was visualized by angiography. The graft exhibits mild diffuse disease. Ost Cx to Prox Cx lesion is 95% stenosed. Mid LM to Dist LM lesion is 80% stenosed. LIMA and is normal in caliber. The graft exhibits no disease.  1. Significant underlying left main and RCA disease with patent grafts including LIMA to LAD and SVG to OM  2.  Patent RCA stent with minimal restenosis. 2.  Normal LV systolic function.  Mildly elevated left ventricular end-diastolic pressure at 14 to 15 mmHg.  5: Lymphedema- - saw vascular (Dew) 10/23 - wearing compression socks a few times a week with good results; encouraged to wear them daily  6: OSA- - has worn CPAP for >20 years   Return in 6 months, sooner if needed.

## 2023-06-14 ENCOUNTER — Ambulatory Visit: Payer: Medicare PPO | Attending: Family | Admitting: Family

## 2023-06-14 ENCOUNTER — Encounter: Payer: Self-pay | Admitting: Family

## 2023-06-14 VITALS — BP 123/79 | HR 74 | Resp 18 | Wt 215.2 lb

## 2023-06-14 DIAGNOSIS — N400 Enlarged prostate without lower urinary tract symptoms: Secondary | ICD-10-CM | POA: Insufficient documentation

## 2023-06-14 DIAGNOSIS — I890 Lymphedema, not elsewhere classified: Secondary | ICD-10-CM | POA: Insufficient documentation

## 2023-06-14 DIAGNOSIS — Z7984 Long term (current) use of oral hypoglycemic drugs: Secondary | ICD-10-CM | POA: Diagnosis not present

## 2023-06-14 DIAGNOSIS — E1122 Type 2 diabetes mellitus with diabetic chronic kidney disease: Secondary | ICD-10-CM | POA: Insufficient documentation

## 2023-06-14 DIAGNOSIS — Z7985 Long-term (current) use of injectable non-insulin antidiabetic drugs: Secondary | ICD-10-CM | POA: Insufficient documentation

## 2023-06-14 DIAGNOSIS — Z955 Presence of coronary angioplasty implant and graft: Secondary | ICD-10-CM | POA: Insufficient documentation

## 2023-06-14 DIAGNOSIS — I5032 Chronic diastolic (congestive) heart failure: Secondary | ICD-10-CM | POA: Insufficient documentation

## 2023-06-14 DIAGNOSIS — I10 Essential (primary) hypertension: Secondary | ICD-10-CM

## 2023-06-14 DIAGNOSIS — I25118 Atherosclerotic heart disease of native coronary artery with other forms of angina pectoris: Secondary | ICD-10-CM

## 2023-06-14 DIAGNOSIS — Z951 Presence of aortocoronary bypass graft: Secondary | ICD-10-CM | POA: Diagnosis not present

## 2023-06-14 DIAGNOSIS — I2510 Atherosclerotic heart disease of native coronary artery without angina pectoris: Secondary | ICD-10-CM | POA: Insufficient documentation

## 2023-06-14 DIAGNOSIS — Z79899 Other long term (current) drug therapy: Secondary | ICD-10-CM | POA: Insufficient documentation

## 2023-06-14 DIAGNOSIS — K219 Gastro-esophageal reflux disease without esophagitis: Secondary | ICD-10-CM | POA: Insufficient documentation

## 2023-06-14 DIAGNOSIS — N189 Chronic kidney disease, unspecified: Secondary | ICD-10-CM | POA: Diagnosis not present

## 2023-06-14 DIAGNOSIS — E785 Hyperlipidemia, unspecified: Secondary | ICD-10-CM | POA: Insufficient documentation

## 2023-06-14 DIAGNOSIS — G4733 Obstructive sleep apnea (adult) (pediatric): Secondary | ICD-10-CM | POA: Diagnosis not present

## 2023-06-14 DIAGNOSIS — R0602 Shortness of breath: Secondary | ICD-10-CM | POA: Diagnosis not present

## 2023-06-14 DIAGNOSIS — E1151 Type 2 diabetes mellitus with diabetic peripheral angiopathy without gangrene: Secondary | ICD-10-CM

## 2023-06-14 DIAGNOSIS — I130 Hypertensive heart and chronic kidney disease with heart failure and stage 1 through stage 4 chronic kidney disease, or unspecified chronic kidney disease: Secondary | ICD-10-CM | POA: Insufficient documentation

## 2023-06-14 DIAGNOSIS — I252 Old myocardial infarction: Secondary | ICD-10-CM | POA: Insufficient documentation

## 2023-06-14 DIAGNOSIS — Z87891 Personal history of nicotine dependence: Secondary | ICD-10-CM | POA: Diagnosis not present

## 2023-06-14 DIAGNOSIS — G4730 Sleep apnea, unspecified: Secondary | ICD-10-CM | POA: Diagnosis not present

## 2023-06-14 DIAGNOSIS — R42 Dizziness and giddiness: Secondary | ICD-10-CM | POA: Diagnosis not present

## 2023-06-14 NOTE — Patient Instructions (Addendum)
It was good to see you today!   Our scheduler was unavailable to get your echo scheduled. I left her a message. She should give you a call after the holidays. If not, please call us and we will direct you to her for scheduling.  Please have your echo completed. You will check in for this at the [facility]. You have to arrive 15 MINS EARLY for preparation, otherwise you will have to reschedule.

## 2023-06-16 ENCOUNTER — Other Ambulatory Visit: Payer: Self-pay | Admitting: Physician Assistant

## 2023-07-17 ENCOUNTER — Other Ambulatory Visit: Payer: Self-pay | Admitting: Cardiovascular Disease

## 2023-07-20 DIAGNOSIS — G4733 Obstructive sleep apnea (adult) (pediatric): Secondary | ICD-10-CM | POA: Diagnosis not present

## 2023-07-26 ENCOUNTER — Ambulatory Visit: Payer: Medicare PPO | Admitting: Family Medicine

## 2023-07-26 ENCOUNTER — Ambulatory Visit
Admission: RE | Admit: 2023-07-26 | Discharge: 2023-07-26 | Disposition: A | Discharge status: Home / Self Care | Payer: Medicare PPO | Source: Ambulatory Visit | Attending: Family Medicine | Admitting: Family Medicine

## 2023-07-26 ENCOUNTER — Ambulatory Visit: Payer: Self-pay

## 2023-07-26 VITALS — BP 142/84 | HR 72 | Resp 16 | Ht 67.0 in | Wt 218.0 lb

## 2023-07-26 DIAGNOSIS — M25551 Pain in right hip: Secondary | ICD-10-CM

## 2023-07-26 DIAGNOSIS — M19011 Primary osteoarthritis, right shoulder: Secondary | ICD-10-CM | POA: Diagnosis not present

## 2023-07-26 DIAGNOSIS — M79671 Pain in right foot: Secondary | ICD-10-CM | POA: Diagnosis not present

## 2023-07-26 DIAGNOSIS — M1712 Unilateral primary osteoarthritis, left knee: Secondary | ICD-10-CM | POA: Diagnosis not present

## 2023-07-26 DIAGNOSIS — M7989 Other specified soft tissue disorders: Secondary | ICD-10-CM | POA: Diagnosis not present

## 2023-07-26 DIAGNOSIS — M25562 Pain in left knee: Secondary | ICD-10-CM

## 2023-07-26 DIAGNOSIS — G8929 Other chronic pain: Secondary | ICD-10-CM | POA: Diagnosis not present

## 2023-07-26 DIAGNOSIS — L409 Psoriasis, unspecified: Secondary | ICD-10-CM

## 2023-07-26 MED ORDER — KETOCONAZOLE 2 % EX SHAM: MEDICATED_SHAMPOO | CUTANEOUS | 3 refills | Status: AC

## 2023-07-26 MED ORDER — INDOMETHACIN 50 MG PO CAPS: 50.0000 mg | ORAL_CAPSULE | Freq: Every day | ORAL | 1 refills | Status: DC | PRN

## 2023-07-26 NOTE — Telephone Encounter (Signed)
 Chief Complaint: Pt fell knee pain from fall - also hip pain Symptoms: pain - swelling Frequency: this weekend Pertinent Negatives: Patient denies  Disposition: [] ED /[] Urgent Care (no appt availability in office) / [x] Appointment(In office/virtual)/ []  [facility]/ [] Home Care/ [] Refused Recommended Disposition /[] [facility]/ []  Follow-up with PCP Additional Notes: PT was getting up from bed. Walker moved and pt fell. His left knee hit the floor. It is swollen and painful.  Pt also has ongoing hip pain. Pt rates his pain as 12.5/10 for both. Pt is wondering if provider will be ordering x-rays. If so please return pts call so he can get them done before OV.  Per pt ok to leave detailed message on his phone.   Reason for Disposition  [1] High-risk adult (e.g., age > 60 years, osteoporosis, chronic steroid use) AND [2] limping  Answer Assessment - Initial Assessment Questions 1. MECHANISM: "How did the fall happen?"     Walker  2. DOMESTIC VIOLENCE AND ELDER ABUSE SCREENING: "Did you fall because someone pushed you or tried to hurt you?" If Yes, ask: "Are you safe now?"     no 3. ONSET: "When did the fall happen?" (e.g., minutes, hours, or days ago)     Getting 4. LOCATION: "What part of the body hit the ground?" (e.g., back, buttocks, head, hips, knees, hands, head, stomach)     Left knee 5. INJURY: "Did you hurt (injure) yourself when you fell?" If Yes, ask: "What did you injure? Tell me more about this?" (e.g., body area; type of injury; pain severity)"     Left knee - sore and swollen 6. PAIN: "Is there any pain?" If Yes, ask: "How bad is the pain?" (e.g., Scale 1-10; or mild,  moderate, severe)   - NONE (0): No pain   - MILD (1-3): Doesn't interfere with normal activities    - MODERATE (4-7): Interferes with normal activities or awakens from sleep    - SEVERE (8-10): Excruciating pain, unable to do any normal activities      11.5/10 7. SIZE: For cuts,  bruises, or swelling, ask: "How large is it?" (e.g., inches or centimeters)      *No Answer* 9. OTHER SYMPTOMS: "Do you have any other symptoms?" (e.g., dizziness, fever, weakness; new onset or worsening).      *No Answer* 10. CAUSE: "What do you think caused the fall (or falling)?" (e.g., tripped, dizzy spell)       Walker not locked?  Answer Assessment - Initial Assessment Questions 1. MECHANISM: "How did the injury happen?" (e.g., twisting injury, direct blow)      Fell 2. ONSET: "When did the injury happen?" (Minutes or hours ago)      Over the weekend 3. LOCATION: "Where is the injury located?"      Left knee 4. APPEARANCE of INJURY: "What does the injury look like?"      Swollen  - painful 5. SEVERITY: "Can you put weight on that leg?" "Can you walk?"      difficult  7. PAIN: "Is there pain?" If Yes, ask: "How bad is the pain?"  "What does it keep you from doing?" (e.g., Scale 1-10; or mild, moderate, severe)   -  NONE: (0): no pain   -  MILD (1-3): doesn't interfere with normal activities    -  MODERATE (4-7): interferes with normal activities (e.g., work or school) or awakens from sleep, limping    -  SEVERE (8-10): excruciating pain, unable  to do any normal activities, unable to walk     11.5/10 9. OTHER SYMPTOMS: "Do you have any other symptoms?"  (e.g., "pop" when knee injured, swelling, locking, buckling)      Leg is swelling - Hip pain  Protocols used: Falls and Falling-A-AH, Knee Injury-A-AH

## 2023-07-26 NOTE — Progress Notes (Signed)
 Established patient visit   Patient: Jonathon Newman   DOB: 09-Jan-1956   68 y.o. Male  MRN: [id number] Visit Date: 07/26/2023  Today's healthcare provider: Jeralene Mom, MD   Chief Complaint  Patient presents with   Hip Pain    Right hip pain for a couple of months   Knee Pain    Left side, since Saturday.   Subjective    Discussed the use of AI scribe software for clinical note transcription with the patient, who gave verbal consent to proceed.  History of Present Illness   Jonathon Newman is a 68 year old male who presents with knee, hip, and foot pain following a fall.  He fell on Saturday while trying to get out of bed, landing on his left knee. Since the fall, he has experienced pain in the left knee, particularly when moving or standing, described as being along the lateral aspect and over the patella. The knee is not swollen, but movement exacerbates the pain.  Following the fall, he noticed swelling in his right foot, which is painful, particularly on the dorsal aspect and around the bone area. No previous pain in this area before the fall. He has a history of gout, previously affecting his big toe, but states that the current foot pain does not resemble past gout episodes. He was previously taking indomethacin  for gout but has run out of the medication.  He also reports right hip pain that has been ongoing for a couple of months, which worsens with movement. The pain is located in the joint area, and he can only sit for about ten minutes before needing to change position to relieve the pressure. He uses a CPAP machine and describes difficulty getting in and out of bed due to the hip pain.  He has been using a cane for the past week due to balance issues, which he attributes to the recent fall. He has been taking ibuprofen for pain relief over the last couple of days.       Medications: Outpatient Medications Prior to Visit  Medication Sig   acetaminophen  (TYLENOL )  500 MG tablet Take 1,000 mg by mouth every 8 (eight) hours as needed for mild pain or moderate pain. Takes once a day   aspirin  EC 81 MG tablet Take 81 mg by mouth daily. Swallow whole.   atorvastatin  (LIPITOR) 40 MG tablet TAKE 1 TABLET DAILY   bisacodyl  (DULCOLAX) 5 MG EC tablet Take 5 mg by mouth daily as needed for moderate constipation.   Blood Glucose Monitoring Suppl (FREESTYLE LITE) DEVI To check blood sugar once daily   carvedilol  (COREG ) 12.5 MG tablet Take 12.5 mg by mouth 2 (two) times daily with a meal.   Cholecalciferol (VITAMIN D3) 50 MCG (2000 UT) capsule Take 2,000 Units by mouth daily.   clobetasol  (TEMOVATE ) 0.05 % external solution Apply 1 Application topically 2 (two) times daily.   cyanocobalamin  (VITAMIN B12) 1000 MCG tablet Take 1,000 mcg by mouth daily.   cyclobenzaprine  (FLEXERIL ) 10 MG tablet TAKE 1 TABLET THREE TIMES A DAY AS NEEDED FOR MUSCLE SPASMS (Patient taking differently: Take 10 mg by mouth 2 (two) times daily.)   diazepam  (VALIUM ) 2 MG tablet Take 2 mg by mouth every 8 (eight) hours as needed.   empagliflozin  (JARDIANCE ) 10 MG TABS tablet TAKE 1 TABLET DAILY BEFORE BREAKFAST   ezetimibe  (ZETIA ) 10 MG tablet TAKE 1 TABLET DAILY   FREESTYLE LITE test strip USE TO CHECK BLOOD SUGAR  ONCE DAILY   isosorbide  mononitrate (IMDUR ) 60 MG 24 hr tablet TAKE 1 TABLET DAILY   Lancets (FREESTYLE) lancets USE TO CHECK BLOOD SUGAR ONCE DAILY   metFORMIN  (GLUCOPHAGE -XR) 500 MG 24 hr tablet TAKE 2 TABLETS TWICE A DAY WITH MEALS   Multiple Vitamin (MULTIVITAMIN WITH MINERALS) TABS tablet Take 1 tablet by mouth daily.   nitroGLYCERIN  (NITROSTAT ) 0.4 MG SL tablet DISSOLVE 1 TABLET UNDER THE TONGUE EVERY 5 MINUTES AS NEEDED FOR CHEST PAIN   OZEMPIC , 0.25 OR 0.5 MG/DOSE, 2 MG/3ML SOPN INJECT 0.5 MG UNDER THE SKIN ONCE A WEEK (Patient taking differently: Inject 0.5 mg into the skin once a week. Sundays)   pantoprazole  (PROTONIX ) 40 MG tablet TAKE 1 TABLET DAILY   potassium chloride   (KLOR-CON ) 10 MEQ tablet TAKE 2 TABLETS TWICE A DAY   pregabalin  (LYRICA ) 300 MG capsule Take 1 capsule (300 mg total) by mouth 2 (two) times daily.   Pyrithione Zinc (DERMAZINC SHAMPOO) 2 % SHAM Apply 1 Dose topically as needed.   sacubitril-valsartan (ENTRESTO ) 49-51 MG TAKE 1 TABLET TWICE A DAY   torsemide  (DEMADEX ) 20 MG tablet Take 2 tablets (40 mg total) by mouth every other day.   [DISCONTINUED] indomethacin  (INDOCIN ) 50 MG capsule Take 50 mg by mouth daily as needed (gout flare). Taking once daily   [DISCONTINUED] ketoconazole  (NIZORAL ) 2 % shampoo Apply topically 2 (two) times a week.   No facility-administered medications prior to visit.   Review of Systems     Objective    BP (!) 142/84 (BP Location: Left Arm, Patient Position: Sitting, Cuff Size: Normal)   Pulse 72   Resp 16   Ht 5\' 7"  (1.702 m)   Wt 218 lb (98.9 kg)   BMI 34.14 kg/m   Physical Exam   EXTREMITIES: Right foot exhibits redness and swelling, with pain upon palpation at the metatarsophalangeal joint. No pain at the heel or lateral aspect of the foot. Left knee movement causes irritation, with pain localized over the kneecap and along the outside.  Tender right posterolateral hip  Assessment & Plan       Right Foot Pain and Swelling Acute onset following a fall. Pain localized to the dorsal aspect of the foot. History of gout but patient reports this pain is different. -Order foot X-ray to rule out fracture or other injury. -Refill Indomethacin  for potential gout flare, to be sent to [facility].  Left Knee Pain Acute onset following a fall. Pain localized over the kneecap. -Order knee X-ray to rule out fracture or other injury.  Right Hip Pain Chronic pain for a couple of months, exacerbated by movement and pressure. -Order hip X-ray to assess for any underlying pathology.  Prescription Refill Ketoconazole  shampoo -Refill prescription, to be sent to [facility].  Follow-up Await results  of X-rays and call patient with results.    No follow-ups on file.      Jeralene Mom, MD  [facility] [phone] (phone) [phone] (fax)  [facility]

## 2023-07-27 ENCOUNTER — Encounter: Payer: Self-pay | Admitting: Family Medicine

## 2023-08-22 ENCOUNTER — Encounter: Payer: Self-pay | Admitting: Family Medicine

## 2023-08-23 ENCOUNTER — Telehealth: Payer: Self-pay | Admitting: Family Medicine

## 2023-08-23 DIAGNOSIS — E1151 Type 2 diabetes mellitus with diabetic peripheral angiopathy without gangrene: Secondary | ICD-10-CM

## 2023-08-23 MED ORDER — METFORMIN HCL ER 500 MG PO TB24
ORAL_TABLET | ORAL | 0 refills | Status: DC
Start: 2023-08-23 — End: 2023-08-31

## 2023-08-23 NOTE — Telephone Encounter (Signed)
[facility] faxed refill request for the following medications:  metFORMIN (GLUCOPHAGE-XR) 500 MG 24 hr tablet   Please advise.  

## 2023-08-30 NOTE — Progress Notes (Unsigned)
 Cardiology Office Note  Date:  08/31/2023   ID:  Jonathon Newman, DOB 09-03-1955, MRN [id number]  PCP:  Sallee Provencal, FNP   Chief Complaint  Patient presents with   6 month follow up     "Doing well."    HPI:  Mr. Dirck Butch is a 68 year old gentleman with past medical history of Remote history of smoking coronary artery disease  chronic diastolic heart failure.   Cypher drug-eluting stent placement to the RCA in 2010.   two-vessel coronary artery bypass graft in 2016 at [facility].   Hyperlipidemia Chronic dizziness date back to 2019 Chronic shortness of breath dating back several years OSA, wears CPAP Diabetes type 2 cardiac catheterization November 2019,  medical management  Spinal cord stimulator chronic back pain Who presents for follow-up of his chronic diastolic CHF, CAD  Last seen by myself in clinic 1/24 Last seen by one of our providers September 2024  Active recently Remodeling house, Took out wall, kitchen cabinets being put in, doing flooring  Chronic right leg pain, hip (some knee) Has spinal cord stimulator which helps with pain  Lab work reviewed A1c 5.8 down from 9.0 in 2022 Total cholesterol 130 LDL 63 in April 2024  EKG personally reviewed by myself on todays visit EKG Interpretation Date/Time:  Tuesday August 31 2023 08:29:40 EDT Ventricular Rate:  75 PR Interval:  178 QRS Duration:  84 QT Interval:  382 QTC Calculation: 426 R Axis:   35  Text Interpretation: Normal sinus rhythm Normal ECG When compared with ECG of 28-Sep-2022 09:04, Nonspecific T wave abnormality no longer evident in Inferior leads Nonspecific T wave abnormality, improved in Anterior leads QT has shortened Confirmed by Julien Nordmann [phone]) on 08/31/2023 8:37:46 AM    Echo in 11/2021 demonstrated an EF of 55 to 60%, no regional wall motion abnormalities, grade 2 diastolic dysfunction, normal RV systolic function with mildly enlarged ventricular cavity size, trivial  aortic insufficiency, and an estimated right atrial pressure of 3 mmHg.    [facility] on 02/10/2022 noting worsening dizziness that was "present all the time."  Dizziness was worse if he looked up or bent over.   MRI of the brain in 05/2016, 03/2020, and 02/2021 showed no acute intracranial abnormality with findings of mild chronic small vessel ischemia and generalized volume loss noted.   Lexiscan MPI showed no significant ischemia with an EF of 62% and was overall low risk.   Outpatient cardiac monitoring showed a predominant rhythm of sinus with an average rate of 73 bpm (range 48 to 118 bpm, 2 episodes of SVT with the fastest and longest interval lasting just 4 beats with a maximal rate of 118 bpm, frequent PACs representing an 8% burden, rare PVCs, and patient triggered events corresponding with sinus rhythm and rare PAC.   Spinal cord stimulator December 2022 Took out temp stimulator, 05/26/21 scheduled for permanent stimulator  GERD on PPI  stress test was ordered Oct 18, 2019  low risk study no significant ischemia ejection fraction 52%  cardiac catheterization November 2019  medical management was recommended  having symptoms of chest discomfort at the time  diagnostic R/LHC on 05/02/2018 that showed significant underlying left main disease with patent grafts including LIMA to LAD and SVG to OM2. The RCA stent was patent with minimal restenosis. He had normal LVSF with a mildly elevated LVEDP at 14-15 mmHg.   He is wearing his C-PAP.  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  PSH:    Past Surgical History:  Procedure Laterality Date   APPENDECTOMY     BACK SURGERY  11/01/2017   SL 5 and S1   CARDIAC CATHETERIZATION     CARPAL TUNNEL RELEASE     left hand   CHOLECYSTECTOMY     CHOLECYSTECTOMY     COLONOSCOPY WITH PROPOFOL N/A 10/28/2022   Procedure: COLONOSCOPY WITH PROPOFOL;  Surgeon: [name], MD;  Location[facility] ENDOSCOPY;  Service: Gastroenterology;  Laterality: N/A;   CORONARY ARTERY BYPASS GRAFT  04/21/2015   CABG x 2 [facility] LIMA to LAD amd SVG to OM2   CORONARY STENT PLACEMENT  2010   Cordis Cypher Sirolimus-eluting stent 2.50 mm x 26 mm placed to the RCA at [facility]    ESOPHAGEAL MANOMETRY N/A 08/04/2017   Procedure: ESOPHAGEAL MANOMETRY (EM);  Surgeon: Toney Reil, MD;  Location: [facility] ENDOSCOPY;  Service: Endoscopy;  Laterality: N/A;   ESOPHAGOGASTRODUODENOSCOPY (EGD) WITH PROPOFOL N/A 06/17/2022   Procedure: ESOPHAGOGASTRODUODENOSCOPY (EGD) WITH PROPOFOL;  Surgeon: [name], MD;  Location: [facility] ENDOSCOPY;  Service: Gastroenterology;  Laterality: N/A;   FRACTURE SURGERY     KNEE SURGERY     KNEE SURGERY     right knee    LAMINECTOMY     "plate in neck Z6-X0"   LEFT HEART CATH AND CORS/GRAFTS ANGIOGRAPHY N/A 05/02/2018   Procedure: CORS/GRAFTS ANGIOGRAPHY;  Surgeon: Iran Ouch, MD;  Location: [facility] INVASIVE CV LAB;  Service: Cardiovascular;  Laterality: N/A;   RIGHT/LEFT HEART CATH AND CORONARY ANGIOGRAPHY Bilateral 05/02/2018   Procedure: LEFT HEART CATH;  Surgeon: Iran Ouch, MD;  Location: [facility] INVASIVE CV LAB;  Service: Cardiovascular;  Laterality: Bilateral;   THORACIC LAMINECTOMY FOR SPINAL CORD STIMULATOR N/A 05/26/2021   Procedure: THORACIC SPINAL CORD STIMULATOR AND PULSE GENERATOR PLACEMENT (MEDTRONIC);  Surgeon: Lucy Chris, MD;  Location: [facility] ORS;  Service: Neurosurgery;  Laterality: N/A;   VASECTOMY      Current Outpatient Medications   Medication Sig Dispense Refill   acetaminophen (TYLENOL) 500 MG tablet Take 1,000 mg by mouth every 8 (eight) hours as needed for mild pain or moderate pain. Takes once a day     aspirin EC 81 MG tablet Take 81 mg by mouth daily. Swallow whole.     atorvastatin (LIPITOR) 40 MG tablet TAKE 1 TABLET DAILY 90 tablet 3   bisacodyl (DULCOLAX) 5 MG EC tablet Take 5 mg by mouth daily as needed for moderate constipation.     carvedilol (COREG) 12.5 MG tablet Take 12.5 mg by mouth 2 (two) times daily with a meal.     Cholecalciferol (VITAMIN D3) 50 MCG (2000 UT) capsule Take 2,000 Units by mouth daily.     clobetasol (TEMOVATE) 0.05 % external solution Apply 1 Application topically 2 (two) times daily. 50 mL 11   cyanocobalamin (VITAMIN B12) 1000 MCG tablet Take 1,000 mcg by mouth daily.     cyclobenzaprine (FLEXERIL) 10 MG tablet TAKE 1 TABLET THREE TIMES A DAY AS NEEDED FOR MUSCLE SPASMS (Patient taking differently: Take 10 mg by mouth 2 (two) times daily.) 90 tablet 11   diazepam (VALIUM) 2 MG tablet Take  2 mg by mouth every 8 (eight) hours as needed.     empagliflozin (JARDIANCE) 10 MG TABS tablet TAKE 1 TABLET DAILY BEFORE BREAKFAST 30 tablet 5   ezetimibe (ZETIA) 10 MG tablet TAKE 1 TABLET DAILY 90 tablet 3   indomethacin (INDOCIN) 50 MG capsule Take 1 capsule (50 mg total) by mouth daily as needed (gout flare). Taking once daily 90 capsule 1   isosorbide mononitrate (IMDUR) 60 MG 24 hr tablet TAKE 1 TABLET DAILY 90 tablet 3   ketoconazole (NIZORAL) 2 % shampoo Apply topically 2 (two) times a week. 120 mL 3   metFORMIN (GLUCOPHAGE) 500 MG tablet Take 1,000 mg by mouth 2 (two) times daily with a meal.     Multiple Vitamin (MULTIVITAMIN WITH MINERALS) TABS tablet Take 1 tablet by mouth daily.     nitroGLYCERIN (NITROSTAT) 0.4 MG SL tablet DISSOLVE 1 TABLET UNDER THE TONGUE EVERY 5 MINUTES AS NEEDED FOR CHEST PAIN 25 tablet 5   OZEMPIC, 0.25 OR 0.5 MG/DOSE, 2 MG/3ML SOPN INJECT 0.5 MG UNDER THE SKIN  ONCE A WEEK (Patient taking differently: Inject 0.5 mg into the skin once a week. Sundays) 9 mL 3   pantoprazole (PROTONIX) 40 MG tablet TAKE 1 TABLET DAILY 90 tablet 3   potassium chloride (KLOR-CON) 10 MEQ tablet TAKE 2 TABLETS TWICE A DAY 360 tablet 3   pregabalin (LYRICA) 300 MG capsule Take 1 capsule (300 mg total) by mouth 2 (two) times daily. 200 capsule 0   Pyrithione Zinc (DERMAZINC SHAMPOO) 2 % SHAM Apply 1 Dose topically as needed. 480 mL 0   sacubitril-valsartan (ENTRESTO) 49-51 MG TAKE 1 TABLET TWICE A DAY 180 tablet 0   torsemide (DEMADEX) 20 MG tablet Take 2 tablets (40 mg total) by mouth every other day.     Blood Glucose Monitoring Suppl (FREESTYLE LITE) DEVI To check blood sugar once daily (Patient not taking: Reported on 08/31/2023) 1 each 0   FREESTYLE LITE test strip USE TO CHECK BLOOD SUGAR ONCE DAILY (Patient not taking: Reported on 08/31/2023) 100 strip 3   Lancets (FREESTYLE) lancets USE TO CHECK BLOOD SUGAR ONCE DAILY (Patient not taking: Reported on 08/31/2023) 100 each 4   No current facility-administered medications for this visit.    Allergies:   Ace inhibitors, Colchicine, and Lisinopril   Social History:  The patient  reports that he quit smoking about 18 years ago. His smoking use included cigarettes. He started smoking about 20 years ago. He has a 0.5 pack-year smoking history. He has quit using smokeless tobacco.  His smokeless tobacco use included chew. He reports that he does not drink alcohol and does not use drugs.   Family History:   family history includes Arthritis in his mother; Cancer in his maternal grandfather; Cataracts in his maternal grandmother; Glaucoma in his maternal grandmother; Heart attack in his paternal grandfather; Heart attack (age of onset: 60) in his father; Heart murmur in his brother; Hyperlipidemia in his father; Hypertension in his father and paternal grandfather; Valvular heart disease in his brother.    Review of Systems: Review  of Systems  Constitutional: Negative.   HENT: Negative.    Respiratory: Negative.    Cardiovascular: Negative.   Gastrointestinal: Negative.   Musculoskeletal: Negative.   Neurological: Negative.   Psychiatric/Behavioral: Negative.    All other systems reviewed and are negative.   PHYSICAL EXAM: VS:  BP 110/70 (BP Location: Left Arm, Patient Position: Sitting, Cuff Size: Normal)   Pulse 75   Ht  5\' 7"  (1.702 m)   Wt 212 lb (96.2 kg)   SpO2 96%   BMI 33.20 kg/m  , BMI Body mass index is 33.2 kg/m. Constitutional:  oriented to person, place, and time. No distress.  HENT:  Head: Grossly normal Eyes:  no discharge. No scleral icterus.  Neck: No JVD, no carotid bruits  Cardiovascular: Regular rate and rhythm, no murmurs appreciated Pulmonary/Chest: Clear to auscultation bilaterally, no wheezes or rails Abdominal: Soft.  no distension.  no tenderness.  Musculoskeletal: Normal range of motion Neurological:  normal muscle tone. Coordination normal. No atrophy Skin: Skin warm and dry Psychiatric: normal affect, pleasant  Recent Labs: 09/30/2022: ALT 34; Hemoglobin 14.3; Platelets 156; TSH 2.430 12/31/2022: BUN 34; Creatinine, Ser 1.66; Potassium 4.4; Sodium 140    Lipid Panel Lab Results  Component Value Date   CHOL 130 09/30/2022   HDL 32 (L) 09/30/2022   LDLCALC 63 09/30/2022   TRIG 212 (H) 09/30/2022      Wt Readings from Last 3 Encounters:  08/31/23 212 lb (96.2 kg)  07/26/23 218 lb (98.9 kg)  06/14/23 215 lb 4 oz (97.6 kg)     ASSESSMENT AND PLAN:  Acute on chronic diastolic congestive heart failure (HCC) -  Normal ejection fraction 2023 Takes torsemide 40 every other day Stable BMP Appears euvolemic on exam  Dizziness Prior chronic history Active at baseline, symptoms stable  GERD Continue on PPI  Coronary artery disease involving native coronary artery of native heart with angina pectoris (HCC) Currently with no symptoms of angina. No further workup  at this time. Continue current medication regimen.  prior catheterization 2019, medical management  S/P coronary artery bypass graft x 2  Stress test as above, low risk Prior cardiac catheterization Denies anginal symptoms, active at baseline remodeling his house  Hyperlipidemia, unspecified hyperlipidemia type Continue Lipitor 40 daily, with Zetia  Cholesterol numbers at goal   Essential hypertension Blood pressure is well controlled on today's visit. No changes made to the medications.  Chronic dizziness Prior ENT work-up in the past  Prior MRI head 2017 no acute findings  Diabetes type 2 A1C less than 6 Down from 9 in 2022   Orders Placed This Encounter  Procedures   EKG 12-Lead     Signed, Dossie Arbour, M.D., Ph.D. 08/31/2023  [facility] [place], [place] [phone]

## 2023-08-31 ENCOUNTER — Ambulatory Visit: Payer: Medicare PPO | Attending: Cardiovascular Disease | Admitting: Cardiovascular Disease

## 2023-08-31 ENCOUNTER — Encounter: Payer: Self-pay | Admitting: Cardiovascular Disease

## 2023-08-31 VITALS — BP 110/70 | HR 75 | Ht 67.0 in | Wt 212.0 lb

## 2023-08-31 DIAGNOSIS — I10 Essential (primary) hypertension: Secondary | ICD-10-CM

## 2023-08-31 DIAGNOSIS — E1151 Type 2 diabetes mellitus with diabetic peripheral angiopathy without gangrene: Secondary | ICD-10-CM | POA: Diagnosis not present

## 2023-08-31 DIAGNOSIS — I5032 Chronic diastolic (congestive) heart failure: Secondary | ICD-10-CM | POA: Diagnosis not present

## 2023-08-31 DIAGNOSIS — Z951 Presence of aortocoronary bypass graft: Secondary | ICD-10-CM

## 2023-08-31 DIAGNOSIS — I25118 Atherosclerotic heart disease of native coronary artery with other forms of angina pectoris: Secondary | ICD-10-CM | POA: Diagnosis not present

## 2023-08-31 DIAGNOSIS — I2510 Atherosclerotic heart disease of native coronary artery without angina pectoris: Secondary | ICD-10-CM

## 2023-08-31 DIAGNOSIS — I890 Lymphedema, not elsewhere classified: Secondary | ICD-10-CM

## 2023-08-31 DIAGNOSIS — E785 Hyperlipidemia, unspecified: Secondary | ICD-10-CM

## 2023-08-31 DIAGNOSIS — G4730 Sleep apnea, unspecified: Secondary | ICD-10-CM

## 2023-08-31 MED ORDER — CARVEDILOL 12.5 MG PO TABS: 12.5000 mg | ORAL_TABLET | Freq: Two times a day (BID) | ORAL | 3 refills | Status: AC

## 2023-08-31 MED ORDER — ENTRESTO 49-51 MG PO TABS: 1.0000 | ORAL_TABLET | Freq: Two times a day (BID) | ORAL | 3 refills | Status: AC

## 2023-08-31 MED ORDER — TORSEMIDE 20 MG PO TABS: 40.0000 mg | ORAL_TABLET | ORAL | 3 refills | Status: DC

## 2023-08-31 NOTE — Patient Instructions (Signed)

## 2023-09-08 ENCOUNTER — Other Ambulatory Visit: Payer: Self-pay | Admitting: Family Medicine

## 2023-09-08 DIAGNOSIS — E1151 Type 2 diabetes mellitus with diabetic peripheral angiopathy without gangrene: Secondary | ICD-10-CM

## 2023-09-08 NOTE — Telephone Encounter (Signed)
 [facility] faxed refill request for the following medications:   OZEMPIC, 0.25 OR 0.5 MG/DOSE, 2 MG/3ML SOPN     Please advise.

## 2023-09-15 MED ORDER — OZEMPIC (0.25 OR 0.5 MG/DOSE) 2 MG/3ML ~~LOC~~ SOPN: 0.5000 mg | PEN_INJECTOR | SUBCUTANEOUS | 0 refills | Status: DC

## 2023-09-26 ENCOUNTER — Other Ambulatory Visit: Payer: Self-pay | Admitting: Family Medicine

## 2023-09-26 DIAGNOSIS — E1151 Type 2 diabetes mellitus with diabetic peripheral angiopathy without gangrene: Secondary | ICD-10-CM

## 2023-09-28 NOTE — Telephone Encounter (Signed)
 Requested Prescriptions  Pending Prescriptions Disp Refills   pantoprazole (PROTONIX) 40 MG tablet [Pharmacy Med Name: PANTOPRAZOLE SODIUM DR TABS 40MG ] 90 tablet 3    Sig: TAKE 1 TABLET DAILY     Gastroenterology: Proton Pump Inhibitors Passed - 09/28/2023  8:25 AM      Passed - Valid encounter within last 12 months    Recent Outpatient Visits           2 months ago Right hip pain   [facility] Lamon Pillow, MD

## 2023-09-29 ENCOUNTER — Telehealth: Payer: Self-pay | Admitting: Family Medicine

## 2023-09-29 NOTE — Telephone Encounter (Signed)
 [facility] is requesting refill FREESTYLE LITE test strip  Please advise

## 2023-09-30 MED ORDER — FREESTYLE LITE TEST VI STRP: ORAL_STRIP | 12 refills | Status: DC

## 2023-10-12 ENCOUNTER — Ambulatory Visit (INDEPENDENT_AMBULATORY_CARE_PROVIDER_SITE_OTHER): Admitting: Family Medicine

## 2023-10-12 ENCOUNTER — Encounter: Payer: Self-pay | Admitting: Family Medicine

## 2023-10-12 VITALS — BP 129/78 | HR 75 | Resp 16 | Ht 67.0 in | Wt 215.0 lb

## 2023-10-12 DIAGNOSIS — M25551 Pain in right hip: Secondary | ICD-10-CM | POA: Diagnosis not present

## 2023-10-12 DIAGNOSIS — M79661 Pain in right lower leg: Secondary | ICD-10-CM

## 2023-10-12 DIAGNOSIS — M25552 Pain in left hip: Secondary | ICD-10-CM | POA: Diagnosis not present

## 2023-10-12 DIAGNOSIS — G8929 Other chronic pain: Secondary | ICD-10-CM

## 2023-10-12 DIAGNOSIS — E790 Hyperuricemia without signs of inflammatory arthritis and tophaceous disease: Secondary | ICD-10-CM

## 2023-10-12 MED ORDER — TRAMADOL HCL 50 MG PO TABS: 50.0000 mg | ORAL_TABLET | Freq: Four times a day (QID) | ORAL | 1 refills | Status: DC | PRN

## 2023-10-12 NOTE — Progress Notes (Signed)
 Established patient visit   Patient: Jonathon Newman   DOB: 1955/06/16   68 y.o. Male  MRN: [id number] Visit Date: 10/12/2023  Today's healthcare provider: Jeralene Mom, MD   Chief Complaint  Patient presents with   Pain    . Bilateral hip pain Hip and leg pain, right side worse. Chronic pain worsening in the last month.   Subjective    Discussed the use of AI scribe software for clinical note transcription with the patient, who gave verbal consent to proceed.  History of Present Illness   Jonathon Newman is a 68 year old male who presents with worsening bilateral hip pain.  He experiences severe bilateral hip pain, initially starting on the left side and progressing to the right, now affecting both hips. The pain prevents him from sitting for more than ten minutes and requires frequent weight shifting. It is exacerbated by wearing tight clothing that applies pressure across his spine.  The pain significantly disrupts his sleep, particularly when changing positions in bed, which he describes as extremely difficult. He uses a CPAP machine and must carefully position himself before sleeping. The pain persists when he rolls over during the night.  For pain management, he currently takes Tylenol , Lyrica , and diazepam . He has previously used tramadol , hydrocodone, and oxycodone , with some relief from oxycodone  and tramadol . He is frustrated with the frequent need for medication, noting that Tylenol 's effects are short-lived.  He has a history of gout, managed with indomethacin  as needed. He previously took allopurinol  without issues but is not currently on it. Indomethacin  helps with gout in his foot but does not alleviate his hip pain. Last uric acid checked by his nephrologist in September was 8.5       Medications: Outpatient Medications Prior to Visit  Medication Sig   acetaminophen  (TYLENOL ) 500 MG tablet Take 1,000 mg by mouth every 8 (eight) hours as needed for mild pain or  moderate pain. Takes once a day   aspirin  EC 81 MG tablet Take 81 mg by mouth daily. Swallow whole.   atorvastatin  (LIPITOR) 40 MG tablet TAKE 1 TABLET DAILY   bisacodyl  (DULCOLAX) 5 MG EC tablet Take 5 mg by mouth daily as needed for moderate constipation.   Blood Glucose Monitoring Suppl (FREESTYLE LITE) DEVI To check blood sugar once daily   carvedilol  (COREG ) 12.5 MG tablet Take 1 tablet (12.5 mg total) by mouth 2 (two) times daily with a meal.   Cholecalciferol (VITAMIN D3) 50 MCG (2000 UT) capsule Take 2,000 Units by mouth daily.   clobetasol  (TEMOVATE ) 0.05 % external solution Apply 1 Application topically 2 (two) times daily.   cyanocobalamin  (VITAMIN B12) 1000 MCG tablet Take 1,000 mcg by mouth daily.   cyclobenzaprine  (FLEXERIL ) 10 MG tablet TAKE 1 TABLET THREE TIMES A DAY AS NEEDED FOR MUSCLE SPASMS (Patient taking differently: Take 10 mg by mouth 2 (two) times daily.)   diazepam  (VALIUM ) 2 MG tablet Take 2 mg by mouth every 8 (eight) hours as needed.   empagliflozin  (JARDIANCE ) 10 MG TABS tablet TAKE 1 TABLET DAILY BEFORE BREAKFAST   ezetimibe  (ZETIA ) 10 MG tablet TAKE 1 TABLET DAILY   FREESTYLE LITE test strip USE TO CHECK BLOOD SUGAR ONCE DAILY   glucose blood (FREESTYLE LITE) test strip Use as instructed   indomethacin  (INDOCIN ) 50 MG capsule Take 1 capsule (50 mg total) by mouth daily as needed (gout flare). Taking once daily   isosorbide  mononitrate (IMDUR ) 60 MG 24 hr tablet TAKE  1 TABLET DAILY   ketoconazole  (NIZORAL ) 2 % shampoo Apply topically 2 (two) times a week.   Lancets (FREESTYLE) lancets USE TO CHECK BLOOD SUGAR ONCE DAILY   metFORMIN  (GLUCOPHAGE -XR) 500 MG 24 hr tablet TAKE 2 TABLETS TWICE A DAY WITH MEALS (NEED FOLLOW UP APPOINTMENT FOR UPDATED LABS AND FURTHER REFILLS)   Multiple Vitamin (MULTIVITAMIN WITH MINERALS) TABS tablet Take 1 tablet by mouth daily.   nitroGLYCERIN  (NITROSTAT ) 0.4 MG SL tablet DISSOLVE 1 TABLET UNDER THE TONGUE EVERY 5 MINUTES AS NEEDED FOR  CHEST PAIN   pantoprazole  (PROTONIX ) 40 MG tablet TAKE 1 TABLET DAILY   potassium chloride  (KLOR-CON ) 10 MEQ tablet TAKE 2 TABLETS TWICE A DAY   pregabalin  (LYRICA ) 300 MG capsule Take 1 capsule (300 mg total) by mouth 2 (two) times daily. (Patient taking differently: Take 300 mg by mouth 2 (two) times daily.)   Pyrithione Zinc (DERMAZINC SHAMPOO) 2 % SHAM Apply 1 Dose topically as needed.   sacubitril-valsartan (ENTRESTO ) 49-51 MG Take 1 tablet by mouth 2 (two) times daily.   Semaglutide ,0.25 or 0.5MG /DOS, (OZEMPIC , 0.25 OR 0.5 MG/DOSE,) 2 MG/3ML SOPN Inject 0.5 mg into the skin once a week. INJECT 0.5 MG UNDER THE SKIN ONCE A WEEK   torsemide  (DEMADEX ) 20 MG tablet Take 2 tablets (40 mg total) by mouth every other day.   No facility-administered medications prior to visit.   Review of Systems     Objective    BP 129/78 (BP Location: Left Arm, Patient Position: Sitting, Cuff Size: Normal)   Pulse 75   Resp 16   Ht 5\' 7"  (1.702 m)   Wt 215 lb (97.5 kg)   SpO2 98%   BMI 33.67 kg/m   Physical Exam   General appearance: Obese male, cooperative and in no acute distress Head: Normocephalic, without obvious abnormality, atraumatic Respiratory: Respirations even and unlabored, normal respiratory rate Extremities: All extremities are intact.  Skin: Skin color, texture, turgor normal. No rashes seen  Psych: Appropriate mood and affect. Neurologic: Mental status: Alert, oriented to person, place, and time, thought content appropriate.    Assessment & Plan     1. Chronic left hip pain (Primary)  - traMADol  (ULTRAM ) 50 MG tablet; Take 1 tablet (50 mg total) by mouth every 6 (six) hours as needed.  Dispense: 60 tablet; Refill: 1 - Ambulatory referral to [facility] per patient preference)  2. Chronic right hip pain  - traMADol  (ULTRAM ) 50 MG tablet; Take 1 tablet (50 mg total) by mouth every 6 (six) hours as needed.  Dispense: 60 tablet; Refill: 1 - Ambulatory  referral to Orthopedic Surgery  3. Pain of right lower leg   4. Hyperuricemia He states gout flares typically occurs in his toe and rapidly resolve with indomethacin . He does not feel his current hip or leg pain are related since indomethacin  has no affect, and is not interested in started back on allopurinol .      Jeralene Mom, MD  [facility] [phone] (phone) [phone] (fax)  [facility]

## 2023-10-13 ENCOUNTER — Telehealth: Payer: Self-pay

## 2023-10-13 DIAGNOSIS — G8929 Other chronic pain: Secondary | ICD-10-CM

## 2023-10-13 NOTE — Telephone Encounter (Signed)
 Copied from CRM [id number]. Topic: Clinical - Prescription Issue >> Oct 13, 2023  4:34 PM Santiya F wrote: Reason for CRM: [facility] is calling in with some concerns regarding traMADol  (ULTRAM ) 50 MG tablet [[id number]]. They say that patient is Opioid naive and they usually only do a 7 day supply in that circumstance. They also have some concerns regarding the combinations of medications he's being prescribed with the tramadol .

## 2023-10-14 NOTE — Telephone Encounter (Signed)
 Copied from CRM [id number]. Topic: General - Other >> Oct 14, 2023 10:14 AM Turkey B wrote: Reason for CRM: Loyce Ruffini from [facility] called in, states needs to do a clinical review for pt due to combination of meds being taken. Ref# [id number]  Please cb hours are 9am -5:30p

## 2023-10-18 NOTE — Telephone Encounter (Signed)
 Stacy calling from [facility] is calling to report that she need to complete a clinical review for the Tramadol . If she has not heard back by 5:30pm EST today will unable to fill the tramadol . CB- [phone] Reference [id number]

## 2023-10-19 ENCOUNTER — Telehealth: Payer: Self-pay

## 2023-10-19 NOTE — Telephone Encounter (Signed)
 Copied from CRM [id number]. Topic: Clinical - Prescription Issue >> Oct 19, 2023  9:04 AM Dorthula Gavel H wrote: Reason for CRM: Pt sates [facility] needed more info from office for traMADol  (ULTRAM ) 50 MG tablet, but then he received a message that they canceled the order.

## 2023-10-19 NOTE — Telephone Encounter (Signed)
 Please advise patient that his insurance will only cover 5 days supply of tramadol  since it is a new medication. Does he want me to send a 5 day supply to his mail order pharmacy or his local pharmacy

## 2023-10-19 NOTE — Telephone Encounter (Signed)
 Copied from CRM [id number]. Topic: Referral - Status >> Oct 19, 2023  9:01 AM Dorthula Gavel H wrote: Reason for CRM: PT is wanting to know if there is an update on the referral for Ortho. Please call back

## 2023-10-19 NOTE — Telephone Encounter (Signed)
 Duplicate. Please see other encounter about Tramadol 

## 2023-10-19 NOTE — Telephone Encounter (Signed)
Called patient and left message to give us a call back.

## 2023-10-20 NOTE — Telephone Encounter (Signed)
 LMTCB, [facility] Triage Nurse may give patient results/message  Insurance will only pay for 5 days of Tramodol, does he still want [facility] to a local pharmacy?   Reason for CRM: patient returning call about prescription- [phone]

## 2023-10-25 ENCOUNTER — Other Ambulatory Visit: Payer: Self-pay | Admitting: Family Medicine

## 2023-10-25 ENCOUNTER — Inpatient Hospital Stay
Admission: RE | Admit: 2023-10-25 | Discharge: 2023-10-25 | Disposition: A | Discharge status: Home / Self Care | Payer: Self-pay | Source: Ambulatory Visit | Attending: Orthopedic Surgery | Admitting: Orthopedic Surgery

## 2023-10-25 DIAGNOSIS — Z049 Encounter for examination and observation for unspecified reason: Secondary | ICD-10-CM

## 2023-10-25 DIAGNOSIS — G8929 Other chronic pain: Secondary | ICD-10-CM

## 2023-10-25 MED ORDER — TRAMADOL HCL 50 MG PO TABS
50.0000 mg | ORAL_TABLET | Freq: Four times a day (QID) | ORAL | 0 refills | Status: AC | PRN
Start: 2023-10-25 — End: ?

## 2023-10-26 NOTE — Telephone Encounter (Signed)
 Copied from CRM [id number]. Topic: Clinical - Prescription Issue >> Oct 26, 2023  2:40 PM Carlatta H wrote: Reason for CRM: Pharmacy needs a call back regarding traMADol  (ULTRAM ) 50 MG tablet [[id number]] prescription please call [phone] #[id number] [phone] >> Oct 26, 2023  3:30 PM Fredrica W wrote: Patient called. [facility] needs clarification on Rx for Tramadol . Let patient know that Pharmacy already reached out today.

## 2023-10-28 NOTE — Progress Notes (Signed)
 Referring Physician:  Tasia Farr, FNP [address] [place],  [place] [address]  Primary Physician:  Tasia Farr, FNP  History of Present Illness: 11/01/2023 [name] has a history of CHF, HTN, DM, CAD, OSA, hyperlipidemia, vertigo, BPH, CABG x 2, left foot drop, NSTEMI.   He is s/p SCS by Dr. Debrah Fan 05/26/21. He has known left foot drop. Also with history of ACDF.   He did great after SCS with minimal pain.   1 year history of constant LBP with bilateral lateral hip pain and pain in both legs between his calf/ankle on lateral aspect. No leg pain. He is constantly having to adjust his SCS to get coverage and turn it up. Pain is worse with sitting and better with laying down.   He has intermittent neck pain with looking up/down. No arm pain. No numbness, tingling, or weakness in his arms.   Advised by renal to avoid NSAIDs as much as possible. He is taking flexeril , tylenol , indocin , lyrica , ultram .   He does not smoke.   Bowel/Bladder Dysfunction: occasional bowel urgency, no incontinence. No perineal numbness.   Conservative measures:  Physical therapy: has not participated in PT  Multimodal medical therapy including regular antiinflammatories: Lyrica , Tylenol , Flexeril , valium , indocin , ultram  Injections:  no epidural steroid injections  Past Surgery:  05/26/2021 SCS placement with Dr. Debrah Fan ACDF C6-C7  Jonathon Newman has some balance issues that are intermittent. No dexterity issues.  The symptoms are causing a significant impact on the patient's life.   Review of Systems:  A 10 point review of systems is negative, except for the pertinent positives and negatives detailed in the HPI.  Past Medical History: Past Medical History:  Diagnosis Date   Anginal pain (HCC)    Arthritis    BPH (benign prostatic hyperplasia)    CHF (congestive heart failure) (HCC)    Coronary artery disease 2010   a.) LHC 2010: high grade RCA stenosis -> 2.5  x 26mm Cypher DES to RCA. b.) NSTEMI 2016 -> PCI revealed pat RCA stent; sig dLM Dz with FFR ratio 0.76 and oLCx stenosis; ref to CVTS. c.) 2v CABG 04/21/2015; LIMA-LAD, SVG-OM2. d.) [facility] 05/02/2018: EF 55-65%; LVEDP 14-15 mmHg; 10% p-m RCA, 30% pRCA, 20% dRCA, 95% o-pLCx, 80% m-dLM; LIMA graft pat. SVG graft with mild diffuse Dz.   DDD (degenerative disc disease), lumbar    Dyspnea    GERD (gastroesophageal reflux disease)    Hypercholesteremia    Hypertension    Left foot drop    NSTEMI (non-ST elevated myocardial infarction) (HCC) 2016   a.) PCI revealed patent RCA stent; significant dLM disease with FFR ratio 0.76 and oLCx stenosis; refer to CVTS for CABG.   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 LIMA to LAD amd SVG to OM2   CORONARY STENT PLACEMENT  2010   Cordis Cypher Sirolimus-eluting stent 2.50 mm x 26 mm placed to the RCA at [facility]    ESOPHAGEAL MANOMETRY N/A 08/04/2017   Procedure: ESOPHAGEAL MANOMETRY (EM);  Surgeon: Selena Daily, MD;  Location: [facility] ENDOSCOPY;  Service: Endoscopy;  Laterality: N/A;   ESOPHAGOGASTRODUODENOSCOPY (EGD) WITH PROPOFOL  N/A 06/17/2022   Procedure: ESOPHAGOGASTRODUODENOSCOPY (EGD) WITH PROPOFOL ;  Surgeon: Luke Salaam, MD;  Location: [facility] ENDOSCOPY;  Service: Gastroenterology;  Laterality: N/A;   FRACTURE SURGERY     KNEE  SURGERY     KNEE SURGERY     right knee    LAMINECTOMY     "plate in neck M5-H8"   LEFT HEART CATH AND CORS/GRAFTS ANGIOGRAPHY N/A 05/02/2018   Procedure: CORS/GRAFTS ANGIOGRAPHY;  Surgeon: Wenona Hamilton, MD;  Location: [facility] INVASIVE CV LAB;  Service: Cardiovascular;  Laterality: N/A;   RIGHT/LEFT HEART CATH AND CORONARY ANGIOGRAPHY Bilateral 05/02/2018   Procedure: LEFT HEART CATH;  Surgeon: Wenona Hamilton, MD;  Location: [facility] INVASIVE CV LAB;  Service: Cardiovascular;  Laterality: Bilateral;   THORACIC LAMINECTOMY FOR SPINAL CORD STIMULATOR N/A 05/26/2021   Procedure: THORACIC SPINAL CORD STIMULATOR AND PULSE GENERATOR PLACEMENT ([employer]);  Surgeon: Berta Brittle, MD;  Location: [facility] ORS;  Service: Neurosurgery;  Laterality: N/A;   VASECTOMY      Allergies: Allergies as of 11/01/2023 - Review Complete 11/01/2023  Allergen Reaction Noted   Ace inhibitors Other (See Comments) 08/03/2012   Colchicine  Other (See Comments) 07/17/2019   Lisinopril Cough 12/02/2017    Medications: Outpatient Encounter Medications as of 11/01/2023  Medication Sig   acetaminophen  (TYLENOL ) 500 MG tablet Take 1,000 mg by mouth every 8 (eight) hours as needed for mild pain or moderate pain. Takes once a day   aspirin  EC 81 MG tablet Take 81 mg by mouth daily. Swallow whole.   atorvastatin  (LIPITOR) 40 MG tablet TAKE 1 TABLET DAILY   bisacodyl  (DULCOLAX) 5 MG EC tablet Take 5 mg by mouth daily as needed.   Blood Glucose Monitoring Suppl (FREESTYLE LITE) DEVI To check blood sugar once daily   carvedilol  (COREG ) 12.5 MG tablet Take 1 tablet (12.5 mg total) by mouth 2 (two) times daily with a meal.   Cholecalciferol (VITAMIN D3) 50 MCG (2000 UT) capsule Take 2,000 Units by mouth daily.   clobetasol  (TEMOVATE ) 0.05 % external solution Apply 1 Application topically 2 (two) times daily.   cyanocobalamin  (VITAMIN B12) 1000 MCG tablet Take 1,000 mcg by mouth daily.   cyclobenzaprine  (FLEXERIL ) 10 MG tablet  TAKE 1 TABLET THREE TIMES A DAY AS NEEDED FOR MUSCLE SPASMS (Patient taking differently: Take 10 mg by mouth 2 (two) times daily.)   diazepam  (VALIUM ) 2 MG tablet Take 2 mg by mouth every 8 (eight) hours as needed.   empagliflozin  (JARDIANCE ) 10 MG TABS tablet TAKE 1 TABLET DAILY BEFORE BREAKFAST   ezetimibe  (ZETIA ) 10 MG tablet TAKE 1 TABLET DAILY   FREESTYLE LITE test strip USE TO CHECK BLOOD SUGAR ONCE DAILY   indomethacin  (INDOCIN ) 50 MG capsule Take 1 capsule (50 mg total) by mouth daily as needed (gout flare). Taking once daily   isosorbide  mononitrate (IMDUR ) 60 MG 24 hr tablet TAKE 1 TABLET DAILY   ketoconazole  (NIZORAL ) 2 % shampoo Apply topically 2 (two) times a week.   Lancets (FREESTYLE) lancets USE TO CHECK BLOOD SUGAR ONCE DAILY   metFORMIN  (GLUCOPHAGE -XR) 500 MG 24 hr  tablet TAKE 2 TABLETS TWICE A DAY WITH MEALS (NEED FOLLOW UP APPOINTMENT FOR UPDATED LABS AND FURTHER REFILLS)   Multiple Vitamin (MULTIVITAMIN WITH MINERALS) TABS tablet Take 1 tablet by mouth daily.   nitroGLYCERIN  (NITROSTAT ) 0.4 MG SL tablet DISSOLVE 1 TABLET UNDER THE TONGUE EVERY 5 MINUTES AS NEEDED FOR CHEST PAIN   pantoprazole  (PROTONIX ) 40 MG tablet TAKE 1 TABLET DAILY   potassium chloride  (KLOR-CON ) 10 MEQ tablet TAKE 2 TABLETS TWICE A DAY   pregabalin  (LYRICA ) 150 MG capsule Take 300 mg by mouth 2 (two) times daily.   Pyrithione Zinc (DERMAZINC SHAMPOO) 2 % SHAM Apply 1 Dose topically as needed.   sacubitril-valsartan (ENTRESTO ) 49-51 MG Take 1 tablet by mouth 2 (two) times daily.   Semaglutide ,0.25 or 0.5MG /DOS, (OZEMPIC , 0.25 OR 0.5 MG/DOSE,) 2 MG/3ML SOPN Inject 0.5 mg into the skin once a week. INJECT 0.5 MG UNDER THE SKIN ONCE A WEEK   torsemide  (DEMADEX ) 20 MG tablet Take 2 tablets (40 mg total) by mouth every other day.   traMADol  (ULTRAM ) 50 MG tablet Take 1 tablet (50 mg total) by mouth every 6 (six) hours as needed.   [DISCONTINUED] glucose blood (FREESTYLE LITE) test strip Use as instructed    [DISCONTINUED] pregabalin  (LYRICA ) 300 MG capsule Take 1 capsule (300 mg total) by mouth 2 (two) times daily. (Patient taking differently: Take 2 capsules by mouth 2 (two) times daily.)   No facility-administered encounter medications on file as of 11/01/2023.    Social History: Social History   Tobacco Use   Smoking status: Former    Current packs/day: 0.00    Average packs/day: 0.3 packs/day for 2.0 years (0.5 ttl pk-yrs)    Types: Cigarettes    Start date: 05/27/2003    Quit date: 05/26/2005    Years since quitting: 18.4   Smokeless tobacco: Former    Types: Associate Professor status: Never Used  Substance Use Topics   Alcohol use: No   Drug use: No    Family Medical History: Family History  Problem Relation Age of Onset   Arthritis Mother    Hyperlipidemia Father    Hypertension Father    Heart attack Father 54   Heart murmur Brother    Valvular heart disease Brother    Cataracts Maternal Grandmother    Glaucoma Maternal Grandmother    Cancer Maternal Grandfather    Heart attack Paternal Grandfather    Hypertension Paternal Grandfather    Prostate cancer Neg Hx    Bladder Cancer Neg Hx    Kidney cancer Neg Hx     Physical Examination: Vitals:   11/01/23 0937  BP: 128/84    General: Patient is well developed, well nourished, calm, collected, and in no apparent distress. Attention to examination is appropriate.  Respiratory: Patient is breathing without any difficulty.   NEUROLOGICAL:     Awake, alert, oriented to person, place, and time.  Speech is clear and fluent. Fund of knowledge is appropriate.   Cranial Nerves: Pupils equal round and reactive to light.  Facial tone is symmetric.    Well healed lumbar incisions.  Mild lower central posterior lumbar tenderness. No tenderness over SCS battery.   No abnormal lesions on exposed skin.   Strength: Side Biceps Triceps Deltoid Interossei Grip Wrist Ext. Wrist Flex.  R 5 5 5 5 5 5 5   L 5 5 5 5  5 5 5    Side Iliopsoas Quads Hamstring PF DF EHL  R  5 5 5 5 5 5   L 5 5 5 5 3 3    Reflexes are 2+ and symmetric at the biceps, brachioradialis, patella and achilles, but 1+ at left achilles.   Hoffman's is absent.  Clonus is not present.   Bilateral upper and lower extremity sensation is intact to light touch.     No pain with IR/ER of both hips. He has tenderness over bilateral greater trochanteric bursa.   He has mild tenderness over right lateral high lateral ankle.   He ambulates with a cane.    Medical Decision Making  Imaging: Lumbar xrays dated 03/29/23:  Mild lumbar spondylosis. SCS partially visualized.   No report for above imaging.   Assessment and Plan: He is s/p SCS by Dr. Debrah Fan 05/26/21. He has known left foot drop. Also with history of ACDF.   He did great after SCS with minimal pain.   Now with 1 year history of constant LBP with bilateral lateral hip pain and right ankle pain. No leg pain. Pain is worse with sitting and better with laying down.   Above lumbar xrays from 2024 show lumbar spondylosis.   He has tenderness over bilateral greater trochanteric bursa.   He has intermittent neck pain with looking up/down. No arm pain. No numbness, tingling, or weakness in his arms.   No recent cervical imaging.   He has tenderness over right lateral ankle, this pain is probably more ankle mediated.   Treatment options discussed with patient and following plan made:   - Referral to ortho at [facility] for bilateral greater trochanteric bursitis.  - Referral to podiatry Lydia Sams) for right ankle pain. He has seen him previously.  - Xrays of cervical, thoracic, and lumbar spine. Will message him with results.  - He has some balance issues, no dexterity issues. No signs of cervical  myelopathy on exam.  - He is to contact Adam with [employer] to discuss reprogramming.  - Follow up with me in 4 weeks to regroup.  - Of note, if he needs further imaging, he states his SCS is NOT  MRI compatible.   I spent a total of 35 minutes in face-to-face and non-face-to-face activities related to this patient's care today including review of outside records, review of imaging, review of symptoms, physical exam, discussion of differential diagnosis, discussion of treatment options, and documentation.   Thank you for involving me in the care of this patient.   Lucetta Russel PA-C Dept. of Neurosurgery

## 2023-11-01 ENCOUNTER — Encounter: Payer: Self-pay | Admitting: Orthopedic Surgery

## 2023-11-01 ENCOUNTER — Ambulatory Visit: Admitting: Orthopedic Surgery

## 2023-11-01 ENCOUNTER — Ambulatory Visit
Admission: RE | Admit: 2023-11-01 | Discharge: 2023-11-01 | Disposition: A | Discharge status: Home / Self Care | Source: Ambulatory Visit | Attending: Orthopedic Surgery | Admitting: Orthopedic Surgery

## 2023-11-01 VITALS — BP 128/84 | Ht 67.0 in | Wt 223.0 lb

## 2023-11-01 DIAGNOSIS — M47816 Spondylosis without myelopathy or radiculopathy, lumbar region: Secondary | ICD-10-CM | POA: Diagnosis not present

## 2023-11-01 DIAGNOSIS — M542 Cervicalgia: Secondary | ICD-10-CM | POA: Diagnosis not present

## 2023-11-01 DIAGNOSIS — M25571 Pain in right ankle and joints of right foot: Secondary | ICD-10-CM

## 2023-11-01 DIAGNOSIS — G8929 Other chronic pain: Secondary | ICD-10-CM

## 2023-11-01 DIAGNOSIS — M545 Low back pain, unspecified: Secondary | ICD-10-CM

## 2023-11-01 DIAGNOSIS — M7062 Trochanteric bursitis, left hip: Secondary | ICD-10-CM

## 2023-11-01 DIAGNOSIS — Z9689 Presence of other specified functional implants: Secondary | ICD-10-CM

## 2023-11-01 DIAGNOSIS — M7061 Trochanteric bursitis, right hip: Secondary | ICD-10-CM

## 2023-11-01 NOTE — Patient Instructions (Signed)
 It was so nice to see you today. Thank you so much for coming in.   I ordered xrays of your neck, mid back, and lower back. You can get these at [facility] (building with the white pillars) off of [address]. The address is [address] [place], [place] [address]. You do not need any appointment. I will message you with results.   You can also go to [facility] for xrays.   Call Adam with [employer] to discuss reprogramming of your SCS.   I want you to see ortho at the [facility] clinic for evaluation of bilateral hip bursitis. They should call you to schedule an appointment or you can call them at [phone].   I put referral in for you to see podiatry (Dr. Lydia Sams) at [facility] in [place]. They should call you. See contact information below.   I will see you back in 4 weeks. Please do not hesitate to call if you have any questions or concerns. You can also message me in MyChart.   Lucetta Russel PA-C [phone]     The physicians and staff at [facility] at [facility] are committed to providing excellent care. You may receive a survey asking for feedback about your experience at our office. We value you your feedback and appreciate you taking the time to to fill it out. The [facility] leadership team is also available to discuss your experience in person, feel free to contact us  [phone].

## 2023-11-02 ENCOUNTER — Telehealth: Payer: Self-pay

## 2023-11-02 ENCOUNTER — Other Ambulatory Visit: Payer: Self-pay | Admitting: Family

## 2023-11-02 DIAGNOSIS — I5032 Chronic diastolic (congestive) heart failure: Secondary | ICD-10-CM

## 2023-11-02 NOTE — Telephone Encounter (Signed)
 Copied from CRM [id number]. Topic: Clinical - Prescription Issue >> Nov 01, 2023  3:08 PM Sasha H wrote: Reason for CRM: [facility] sated they will not be able to fill the prescription for traMADol  (ULTRAM ) 50 MG tablet

## 2023-11-05 ENCOUNTER — Encounter: Payer: Self-pay | Admitting: Orthopedic Surgery

## 2023-11-05 NOTE — Telephone Encounter (Signed)
 He had cervical and lumbar xrays done at [facility] on 11/01/23- they did oblique views. He needs flex/ext views of both cervical and lumbar spine done at no charge.   Please let him know how to do this.   Message sent to him about his xays.   Xrays of cervical/thoracic/lumbar spine dated 11/01/23:   FINDINGS: Cervical spine:   Minimal anterolisthesis of C4 on C5. ACDF changes seen at C6-C7. Mild disc height loss and endplate spurring at C5-C6. Facet uncovertebral degenerative changes seen throughout the cervical spine. Severe RIGHT neural foraminal stenosis at C3-C4, C5-C6, C6-C7. moderate LEFT neural foraminal stenosis seen at C3-C4, C4-C5, C5-C6.   Thoracic spine:   Alignment is within normal limits. Minimal anterior wedging of the T12 vertebral body is unchanged since 03/29/2023. Minimal disc height loss and endplate spurring seen throughout the mid and lower thoracic levels. Neurostimulator leads again seen terminating at the level of T7-T8. Postsurgical changes of CABG again seen. Multiple fractured median sternotomy wires are present.   Lumbar spine:   Minimal retrolisthesis of L3 on L4 and minimal anterolisthesis of L4 on L5. Mild disc height loss and endplate degenerative changes seen throughout the lumbar spine, greatest at L5-S1. Moderate facet degenerative changes seen throughout the lumbar spine, greatest at L4-L5. 8 mm calcific density in the RIGHT upper quadrant favored to be bowel content rather than nephrolithiasis.   IMPRESSION: 1. Mild-to-moderate multilevel degenerative changes of the cervical spine, greatest at C5-C6. 2. Minimal multilevel degenerative changes of the thoracic spine. 3. Mild to moderate multilevel degenerative changes of the lumbar spine, greatest at L5-S1.     Electronically Signed   By: Elester Grim M.D.   On: 11/04/2023 09:06   I have personally reviewed the images and agree with the above interpretation.  SCS appears to be in good  position in comparison to xrays from 05/26/21.

## 2023-11-09 ENCOUNTER — Other Ambulatory Visit: Payer: Self-pay | Admitting: Family Medicine

## 2023-11-09 NOTE — Telephone Encounter (Signed)
 [facility] is requesting refill cyclobenzaprine  (FLEXERIL ) 10 MG tablet   Please advise

## 2023-11-09 NOTE — Telephone Encounter (Signed)
 LOV 4*29*09 May 617*25 LRF [id number] LABS [id number]

## 2023-11-10 ENCOUNTER — Other Ambulatory Visit: Payer: Self-pay

## 2023-11-10 MED ORDER — CYCLOBENZAPRINE HCL 10 MG PO TABS: 10.0000 mg | ORAL_TABLET | Freq: Three times a day (TID) | ORAL | 1 refills | Status: AC | PRN

## 2023-11-10 NOTE — Telephone Encounter (Signed)
 I called patient and asked him to return to the imaging center to have the other 2 X rays done. Patient states he will go on Monday.

## 2023-11-15 ENCOUNTER — Other Ambulatory Visit: Payer: Self-pay | Admitting: Family Medicine

## 2023-11-15 ENCOUNTER — Other Ambulatory Visit: Payer: Self-pay | Admitting: Orthopedic Surgery

## 2023-11-16 ENCOUNTER — Encounter: Payer: Self-pay | Admitting: Orthopedic Surgery

## 2023-11-16 NOTE — Telephone Encounter (Signed)
 Cervical/thoracic/lumbar xrays dated 11/01/23:   FINDINGS: Cervical spine:   Minimal anterolisthesis of C4 on C5. ACDF changes seen at C6-C7. Mild disc height loss and endplate spurring at C5-C6. Facet uncovertebral degenerative changes seen throughout the cervical spine. Severe RIGHT neural foraminal stenosis at C3-C4, C5-C6, C6-C7. moderate LEFT neural foraminal stenosis seen at C3-C4, C4-C5, C5-C6.   Thoracic spine:   Alignment is within normal limits. Minimal anterior wedging of the T12 vertebral body is unchanged since 03/29/2023. Minimal disc height loss and endplate spurring seen throughout the mid and lower thoracic levels. Neurostimulator leads again seen terminating at the level of T7-T8. Postsurgical changes of CABG again seen. Multiple fractured median sternotomy wires are present.   Lumbar spine:   Minimal retrolisthesis of L3 on L4 and minimal anterolisthesis of L4 on L5. Mild disc height loss and endplate degenerative changes seen throughout the lumbar spine, greatest at L5-S1. Moderate facet degenerative changes seen throughout the lumbar spine, greatest at L4-L5. 8 mm calcific density in the RIGHT upper quadrant favored to be bowel content rather than nephrolithiasis.   IMPRESSION: 1. Mild-to-moderate multilevel degenerative changes of the cervical spine, greatest at C5-C6. 2. Minimal multilevel degenerative changes of the thoracic spine. 3. Mild to moderate multilevel degenerative changes of the lumbar spine, greatest at L5-S1.   Electronically Signed: By: Elester Grim M.D. On: 11/04/2023 09:06   ADDENDUM REPORT: 11/15/2023 20:45   ADDENDUM: Flexion and extension views were added to the cervical and lumbar spine radiographs. Addendum requested.   Cervical spine flexion and extension views:   Minimal anterolisthesis of C4 on C5 slightly improves with extension, but does not significantly change with flexion.   Lumbar spine flexion and extension  views:   Minimal retrolisthesis of L3 on L4 does not significantly change with flexion or extension.   Minimal anterolisthesis of L4 on L5 slightly worsens with flexion and slightly improves with extension.     Electronically Signed   By: Elester Grim M.D.   On: 11/15/2023 20:45   I have personally reviewed the images and agree with the above interpretation.

## 2023-11-25 ENCOUNTER — Ambulatory Visit (INDEPENDENT_AMBULATORY_CARE_PROVIDER_SITE_OTHER): Admitting: Podiatry

## 2023-11-25 DIAGNOSIS — M7671 Peroneal tendinitis, right leg: Secondary | ICD-10-CM | POA: Diagnosis not present

## 2023-11-25 NOTE — Progress Notes (Signed)
 Subjective:  Patient ID: Jonathon Newman, male    DOB: [birth year],  MRN: [id number]  Chief Complaint  Patient presents with   Foot Pain    Right foot pain no known injuries     68 y.o. male presents with the above complaint.  Patient presents with complaint of right lateral foot pain.  Patient states is painful to touch is progressive gotten worse worse with ambulation shoe pressure.  He states it came out of nowhere he is having a lot of hip issues he is compensating his gait has changed.  Now his lateral part of the foot is hurting.  Denies seeing anyone else prior to seeing me denies any other acute complaints.   Review of Systems: Negative except as noted in the HPI. Denies N/V/F/Ch.  Past Medical History:  Diagnosis Date   Anginal pain (HCC)    Arthritis    BPH (benign prostatic hyperplasia)    CHF (congestive heart failure) (HCC)    Coronary artery disease 2010   a.) LHC 2010: high grade RCA stenosis -> 2.5 x 26mm Cypher DES to RCA. b.) NSTEMI 2016 -> PCI revealed pat RCA stent; sig dLM Dz with FFR ratio 0.76 and oLCx stenosis; ref to CVTS. c.) 2v CABG 04/21/2015; LIMA-LAD, SVG-OM2. d.) [facility] 05/02/2018: EF 55-65%; LVEDP 14-15 mmHg; 10% p-m RCA, 30% pRCA, 20% dRCA, 95% o-pLCx, 80% m-dLM; LIMA graft pat. SVG graft with mild diffuse Dz.   DDD (degenerative disc disease), lumbar    Dyspnea    GERD (gastroesophageal reflux disease)    Hypercholesteremia    Hypertension    Left foot drop    NSTEMI (non-ST elevated myocardial infarction) (HCC) 2016   a.) PCI revealed patent RCA stent; significant dLM disease with FFR ratio 0.76 and oLCx stenosis; refer to CVTS for CABG.   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 Takes  once a day, Disp: , Rfl:    aspirin  EC 81 MG tablet, Take 81 mg by mouth daily. Swallow whole., Disp: , Rfl:    atorvastatin  (LIPITOR) 40 MG tablet, TAKE 1 TABLET DAILY, Disp: 90 tablet, Rfl: 3   bisacodyl  (DULCOLAX) 5 MG EC tablet, Take 5 mg by mouth daily as needed., Disp: , Rfl:    Blood Glucose Monitoring Suppl (FREESTYLE LITE) DEVI, To check blood sugar once daily, Disp: 1 each, Rfl: 0   carvedilol  (COREG ) 12.5 MG tablet, Take 1 tablet (12.5 mg total) by mouth 2 (two) times daily with a meal., Disp: 180 tablet, Rfl: 3   Cholecalciferol (VITAMIN D3) 50 MCG (2000 UT) capsule, Take 2,000 Units by mouth daily., Disp: , Rfl:    clobetasol  (TEMOVATE ) 0.05 % external solution, Apply 1 Application topically 2 (two) times daily., Disp: 50 mL, Rfl: 11   cyanocobalamin  (VITAMIN B12) 1000 MCG tablet, Take 1,000 mcg by mouth daily., Disp: , Rfl:    cyclobenzaprine  (FLEXERIL ) 10 MG tablet, Take 1 tablet (10 mg total) by mouth 3 (three) times daily as needed for muscle spasms., Disp: 90 tablet, Rfl: 1   diazepam  (VALIUM ) 2 MG tablet, Take 2 mg by mouth every 8 (eight) hours as needed., Disp: , Rfl:    ezetimibe  (ZETIA )  10 MG tablet, TAKE 1 TABLET DAILY, Disp: 90 tablet, Rfl: 3   FREESTYLE LITE test strip, USE TO CHECK BLOOD SUGAR ONCE DAILY, Disp: 100 strip, Rfl: 3   indomethacin  (INDOCIN ) 50 MG capsule, Take 1 capsule (50 mg total) by mouth daily as needed (gout flare). Taking once daily, Disp: 90 capsule, Rfl: 1   isosorbide  mononitrate (IMDUR ) 60 MG 24 hr tablet, TAKE 1 TABLET DAILY, Disp: 90 tablet, Rfl: 3   JARDIANCE  10 MG TABS tablet, TAKE 1 TABLET DAILY BEFORE BREAKFAST, Disp: 30 tablet, Rfl: 11   ketoconazole  (NIZORAL ) 2 % shampoo, Apply topically 2 (two) times a week., Disp: 120 mL, Rfl: 3   Lancets (FREESTYLE) lancets, USE TO CHECK BLOOD SUGAR ONCE DAILY, Disp: 100 each, Rfl: 4   metFORMIN  (GLUCOPHAGE -XR) 500 MG 24 hr tablet, TAKE 2 TABLETS TWICE A DAY WITH MEALS (NEED FOLLOW UP APPOINTMENT FOR  UPDATED LABS AND FURTHER REFILLS), Disp: 120 tablet, Rfl: 11   Multiple Vitamin (MULTIVITAMIN WITH MINERALS) TABS tablet, Take 1 tablet by mouth daily., Disp: , Rfl:    nitroGLYCERIN  (NITROSTAT ) 0.4 MG SL tablet, DISSOLVE 1 TABLET UNDER THE TONGUE EVERY 5 MINUTES AS NEEDED FOR CHEST PAIN, Disp: 25 tablet, Rfl: 5   pantoprazole  (PROTONIX ) 40 MG tablet, TAKE 1 TABLET DAILY, Disp: 90 tablet, Rfl: 3   potassium chloride  (KLOR-CON ) 10 MEQ tablet, TAKE 2 TABLETS TWICE A DAY, Disp: 360 tablet, Rfl: 3   pregabalin  (LYRICA ) 150 MG capsule, Take 300 mg by mouth 2 (two) times daily., Disp: , Rfl:    Pyrithione Zinc (DERMAZINC SHAMPOO) 2 % SHAM, Apply 1 Dose topically as needed., Disp: 480 mL, Rfl: 0   sacubitril-valsartan (ENTRESTO ) 49-51 MG, Take 1 tablet by mouth 2 (two) times daily., Disp: 180 tablet, Rfl: 3   Semaglutide ,0.25 or 0.5MG /DOS, (OZEMPIC , 0.25 OR 0.5 MG/DOSE,) 2 MG/3ML SOPN, Inject 0.5 mg into the skin once a week. INJECT 0.5 MG UNDER THE SKIN ONCE A WEEK, Disp: 9 mL, Rfl: 0   torsemide  (DEMADEX ) 20 MG tablet, Take 2 tablets (40 mg total) by mouth every other day., Disp: 90 tablet, Rfl: 3   traMADol  (ULTRAM ) 50 MG tablet, Take 1 tablet (50 mg total) by mouth every 6 (six) hours as needed., Disp: 20 tablet, Rfl: 0  Social History   Tobacco Use  Smoking Status Former   Current packs/day: 0.00   Average packs/day: 0.3 packs/day for 2.0 years (0.5 ttl pk-yrs)   Types: Cigarettes   Start date: 05/27/2003   Quit date: 05/26/2005   Years since quitting: 18.5  Smokeless Tobacco Former   Types: Chew    Allergies  Allergen Reactions   Ace Inhibitors Other (See Comments)   Colchicine  Other (See Comments)    Palpitations and headache   Lisinopril Cough   Objective:  There were no vitals filed for this visit. There is no height or weight on file to calculate BMI. Constitutional Well developed. Well nourished.  Vascular Dorsalis pedis pulses palpable bilaterally. Posterior tibial pulses  palpable bilaterally. Capillary refill normal to all digits.  No cyanosis or clubbing noted. Pedal hair growth normal.  Neurologic Normal speech. Oriented to person, place, and time. Epicritic sensation to light touch grossly present bilaterally.  Dermatologic Nails well groomed and normal in appearance. No open wounds. No skin lesions.  Orthopedic: Pain on palpation to the right right lateral foot along the course of the peroneal tendon proximally to the fibula.  Pain with dorsiflexion eversion of the foot resisted no pain with  plantarflexion inversion of the foot no pain at the Achilles tendon posterior tibial tendon ATFL ligament.  No pain at the ankle joint no deep intra-articular ankle pain noted.  Pes planovalgus foot structure noted.   Radiographs: None Assessment:   1. Peroneal tendinitis of lower leg, right    Plan:  Patient was evaluated and treated and all questions answered.  Right peroneal tendinitis with underlying pes planovalgus - All questions or concerns were discussed with the patient in extensive detail given the amount of pain that he is experiencing he will benefit from steroid injection to help decrease acute inflammatory component associate with foot pain.  Patient agrees with plan to proceed with steroid injection I discussed the risk of rupture associate with it.  Given that he is having hip issues is not ideal candidate for But he agrees with the plan -A steroid injection was performed at the right lateral foot a point of maximal tenderness using 1% plain Lidocaine  and 10 mg of Kenalog. This was well tolerated. - Tri-Lock ankle brace was dispensed.   No follow-ups on file.

## 2023-11-26 NOTE — Progress Notes (Unsigned)
 Referring Physician:  Tasia Farr, FNP [address] [place],  [place] [address]  Primary Physician:  Tasia Farr, FNP  History of Present Illness: 11/29/2023 [name] has a history of CHF, HTN, DM, CAD, OSA, hyperlipidemia, vertigo, BPH, CABG x 2, left foot drop, NSTEMI.   He is s/p SCS by Dr. Debrah Fan 05/26/21. He has known left foot drop. Also with history of ACDF.   He did great after SCS with minimal pain.   Last seen by me on 11/01/23  for neck and back pain.   He has known lumbar spondylosis and had tenderness over bilateral GTB.   He was sent to ortho for GTB and to podiatry for right ankle pain.   He had bilateral hip injections by Dr. Windle Hatch on 11/22/23. He only had 3 days of relief with injections.   He had injection into right foot by podiatry last week and is in a brace. No relief with injection.   He met with [employer] rep and had some reprogramming done of his SCS. This helps with his pain sometimes.   He is here to review his cervical, thoracic, and lumbar xrays.   He continues with constant LBP with bilateral lateral hip pain to his knees. No pain from knees to calf, but then he has pain from calf to ankles laterally. He has pain with standing and walking. He has numbness and tingling in his feet- he has known neuropathy.   He has intermittent neck pain with looking up/down. This is tolerable. No arm pain. No numbness, tingling, or weakness in his arms.   Advised by renal to avoid NSAIDs as much as possible. He is taking flexeril , tylenol , indocin , lyrica , ultram .   He does not smoke.   Bowel/Bladder Dysfunction: occasional bowel urgency, no incontinence. No perineal numbness.   Conservative measures:  Physical therapy: has not participated in PT  Multimodal medical therapy including regular antiinflammatories: Lyrica , Tylenol , Flexeril , valium , indocin , ultram  Injections:  no epidural steroid injections  Past Surgery:   05/26/2021 SCS placement with Dr. Debrah Fan ACDF C6-C7  Jonathon Newman has some balance issues that are intermittent. No dexterity issues.  The symptoms are causing a significant impact on the patient's life.   Review of Systems:  A 10 point review of systems is negative, except for the pertinent positives and negatives detailed in the HPI.  Past Medical History: Past Medical History:  Diagnosis Date   Anginal pain (HCC)    Arthritis    BPH (benign prostatic hyperplasia)    CHF (congestive heart failure) (HCC)    Coronary artery disease 2010   a.) LHC 2010: high grade RCA stenosis -> 2.5 x 26mm Cypher DES to RCA. b.) NSTEMI 2016 -> PCI revealed pat RCA stent; sig dLM Dz with FFR ratio 0.76 and oLCx stenosis; ref to CVTS. c.) 2v CABG 04/21/2015; LIMA-LAD, SVG-OM2. d.) [facility] 05/02/2018: EF 55-65%; LVEDP 14-15 mmHg; 10% p-m RCA, 30% pRCA, 20% dRCA, 95% o-pLCx, 80% m-dLM; LIMA graft pat. SVG graft with mild diffuse Dz.   DDD (degenerative disc disease), lumbar    Dyspnea    GERD (gastroesophageal reflux disease)    Hypercholesteremia    Hypertension    Left foot drop    NSTEMI (non-ST elevated myocardial infarction) (HCC) 2016   a.) PCI revealed patent RCA stent; significant dLM disease with FFR ratio 0.76 and oLCx stenosis; refer to CVTS for CABG.   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 LIMA  to LAD amd SVG to OM2   CORONARY STENT PLACEMENT  2010   Cordis Cypher Sirolimus-eluting stent 2.50 mm x 26 mm placed to the RCA at [facility]    ESOPHAGEAL MANOMETRY N/A 08/04/2017   Procedure: ESOPHAGEAL MANOMETRY (EM);  Surgeon: Selena Daily, MD;  Location: [facility] ENDOSCOPY;  Service: Endoscopy;  Laterality: N/A;   ESOPHAGOGASTRODUODENOSCOPY (EGD) WITH PROPOFOL  N/A 06/17/2022   Procedure: ESOPHAGOGASTRODUODENOSCOPY (EGD) WITH PROPOFOL ;  Surgeon: Luke Salaam, MD;  Location: [facility] ENDOSCOPY;  Service: Gastroenterology;  Laterality: N/A;   FRACTURE SURGERY     KNEE SURGERY     KNEE SURGERY     right knee    LAMINECTOMY     plate in neck P1-W2   LEFT HEART CATH AND CORS/GRAFTS ANGIOGRAPHY N/A 05/02/2018   Procedure: CORS/GRAFTS ANGIOGRAPHY;  Surgeon: Wenona Hamilton, MD;  Location: [facility] INVASIVE CV LAB;  Service: Cardiovascular;  Laterality: N/A;   RIGHT/LEFT HEART CATH AND CORONARY ANGIOGRAPHY Bilateral 05/02/2018   Procedure: LEFT HEART CATH;  Surgeon: Wenona Hamilton, MD;  Location: [facility] INVASIVE CV LAB;  Service: Cardiovascular;  Laterality: Bilateral;   THORACIC LAMINECTOMY FOR SPINAL CORD STIMULATOR N/A 05/26/2021   Procedure: THORACIC SPINAL CORD STIMULATOR AND PULSE GENERATOR PLACEMENT ([employer]);  Surgeon: Berta Brittle, MD;  Location: [facility] ORS;  Service: Neurosurgery;  Laterality: N/A;   VASECTOMY      Allergies: Allergies as of 11/29/2023 - Review Complete 11/29/2023  Allergen Reaction Noted   Ace inhibitors Other (See Comments) 08/03/2012   Colchicine  Other (See Comments) 07/17/2019   Lisinopril Cough 12/02/2017    Medications: Outpatient Encounter Medications as of 11/29/2023  Medication Sig   acetaminophen  (TYLENOL ) 500 MG tablet Take 1,000 mg by mouth every 8 (eight) hours as needed for mild pain or moderate pain. Takes once a day   aspirin  EC 81 MG tablet Take 81 mg by mouth daily. Swallow whole.   atorvastatin  (LIPITOR) 40 MG tablet  TAKE 1 TABLET DAILY   bisacodyl  (DULCOLAX) 5 MG EC tablet Take 5 mg by mouth daily as needed.   Blood Glucose Monitoring Suppl (FREESTYLE LITE) DEVI To check blood sugar once daily   carvedilol  (COREG ) 12.5 MG tablet Take 1 tablet (12.5 mg total) by mouth 2 (two) times daily with a meal.   Cholecalciferol (VITAMIN D3) 50 MCG (2000 UT) capsule Take 2,000 Units by mouth daily.   clobetasol  (TEMOVATE ) 0.05 % external solution Apply 1 Application topically 2 (two) times daily.   cyanocobalamin  (VITAMIN B12) 1000 MCG tablet Take 1,000 mcg by mouth daily.   cyclobenzaprine  (FLEXERIL ) 10 MG tablet Take 1 tablet (10 mg total) by mouth 3 (three) times daily as needed for muscle spasms.   diazepam  (VALIUM ) 2 MG tablet Take 2 mg by mouth every 8 (eight)  hours as needed.   ezetimibe  (ZETIA ) 10 MG tablet TAKE 1 TABLET DAILY   FREESTYLE LITE test strip USE TO CHECK BLOOD SUGAR ONCE DAILY   indomethacin  (INDOCIN ) 50 MG capsule Take 1 capsule (50 mg total) by mouth daily as needed (gout flare). Taking once daily   isosorbide  mononitrate (IMDUR ) 60 MG 24 hr tablet TAKE 1 TABLET DAILY   JARDIANCE  10 MG TABS tablet TAKE 1 TABLET DAILY BEFORE BREAKFAST   ketoconazole  (NIZORAL ) 2 % shampoo Apply topically 2 (two) times a week.   Lancets (FREESTYLE) lancets USE TO CHECK BLOOD SUGAR ONCE DAILY   metFORMIN  (GLUCOPHAGE -XR) 500 MG 24 hr tablet TAKE 2 TABLETS TWICE A DAY WITH MEALS (NEED FOLLOW UP APPOINTMENT FOR UPDATED LABS AND FURTHER REFILLS)   Multiple Vitamin (MULTIVITAMIN WITH MINERALS) TABS tablet Take 1 tablet by mouth daily.   nitroGLYCERIN  (NITROSTAT ) 0.4 MG SL tablet DISSOLVE 1 TABLET UNDER THE TONGUE EVERY 5 MINUTES AS NEEDED FOR CHEST PAIN   pantoprazole  (PROTONIX ) 40 MG tablet TAKE 1 TABLET DAILY   potassium chloride  (KLOR-CON ) 10 MEQ tablet TAKE 2 TABLETS TWICE A DAY   pregabalin  (LYRICA ) 150 MG capsule Take 300 mg by mouth 2 (two) times daily.   Pyrithione Zinc (DERMAZINC SHAMPOO) 2 % SHAM Apply 1 Dose  topically as needed.   sacubitril-valsartan (ENTRESTO ) 49-51 MG Take 1 tablet by mouth 2 (two) times daily.   Semaglutide ,0.25 or 0.5MG /DOS, (OZEMPIC , 0.25 OR 0.5 MG/DOSE,) 2 MG/3ML SOPN Inject 0.5 mg into the skin once a week. INJECT 0.5 MG UNDER THE SKIN ONCE A WEEK   torsemide  (DEMADEX ) 20 MG tablet Take 2 tablets (40 mg total) by mouth every other day.   [DISCONTINUED] traMADol  (ULTRAM ) 50 MG tablet Take 1 tablet (50 mg total) by mouth every 6 (six) hours as needed.   No facility-administered encounter medications on file as of 11/29/2023.    Social History: Social History   Tobacco Use   Smoking status: Former    Current packs/day: 0.00    Average packs/day: 0.3 packs/day for 2.0 years (0.5 ttl pk-yrs)    Types: Cigarettes    Start date: 05/27/2003    Quit date: 05/26/2005    Years since quitting: 18.5   Smokeless tobacco: Former    Types: Associate Professor status: Never Used  Substance Use Topics   Alcohol use: No   Drug use: No    Family Medical History: Family History  Problem Relation Age of Onset   Arthritis Mother    Hyperlipidemia Father    Hypertension Father    Heart attack Father 32   Heart murmur Brother    Valvular heart disease Brother    Cataracts Maternal Grandmother    Glaucoma Maternal Grandmother    Cancer Maternal Grandfather    Heart attack Paternal Grandfather    Hypertension Paternal Grandfather    Prostate cancer Neg Hx    Bladder Cancer Neg Hx    Kidney cancer Neg Hx     Physical Examination: Vitals:   11/29/23 0819  BP: 132/86      Awake, alert, oriented to person, place, and time.  Speech is clear and fluent. Fund of knowledge is appropriate.   Cranial Nerves: Pupils equal round and reactive to light.  Facial tone is symmetric.    Mild lower central posterior lumbar tenderness.  No abnormal lesions on exposed skin.   Strength: Side Biceps Triceps Deltoid Interossei Grip Wrist Ext. Wrist Flex.  R 5 5 5  5 5 5 5   L 5  5 5 5 5 5 5    Side Iliopsoas Quads Hamstring PF DF EHL  R 5 5 5 5 5 5   L 5 5 5 5 3 3    Reflexes are 2+ and symmetric at the biceps, brachioradialis, patella.     He has ankle brace on right foot.   Bilateral upper and lower extremity sensation is intact to light touch.     He has mild pain with IR/ER of both hips. He has tenderness over right greater trochanteric bursa.   He ambulates with a cane.    Medical Decision Making  Imaging: Cervical, thoracic, lumbar xrays dated 11/01/23:  FINDINGS: Cervical spine:   Minimal anterolisthesis of C4 on C5. ACDF changes seen at C6-C7. Mild disc height loss and endplate spurring at C5-C6. Facet uncovertebral degenerative changes seen throughout the cervical spine. Severe RIGHT neural foraminal stenosis at C3-C4, C5-C6, C6-C7. moderate LEFT neural foraminal stenosis seen at C3-C4, C4-C5, C5-C6.   Thoracic spine:   Alignment is within normal limits. Minimal anterior wedging of the T12 vertebral body is unchanged since 03/29/2023. Minimal disc height loss and endplate spurring seen throughout the mid and lower thoracic levels. Neurostimulator leads again seen terminating at the level of T7-T8. Postsurgical changes of CABG again seen. Multiple fractured median sternotomy wires are present.   Lumbar spine:   Minimal retrolisthesis of L3 on L4 and minimal anterolisthesis of L4 on L5. Mild disc height loss and endplate degenerative changes seen throughout the lumbar spine, greatest at L5-S1. Moderate facet degenerative changes seen throughout the lumbar spine, greatest at L4-L5. 8 mm calcific density in the RIGHT upper quadrant favored to be bowel content rather than nephrolithiasis.   IMPRESSION: 1. Mild-to-moderate multilevel degenerative changes of the cervical spine, greatest at C5-C6. 2. Minimal multilevel degenerative changes of the thoracic spine. 3. Mild to moderate multilevel degenerative changes of the lumbar spine,  greatest at L5-S1.   Electronically Signed: By: Elester Grim M.D.  Flexion/extension xrays of cervical and lumbar spine dated 11/15/23:  ADDENDUM: Flexion and extension views were added to the cervical and lumbar spine radiographs. Addendum requested.   Cervical spine flexion and extension views:   Minimal anterolisthesis of C4 on C5 slightly improves with extension, but does not significantly change with flexion.   Lumbar spine flexion and extension views:   Minimal retrolisthesis of L3 on L4 does not significantly change with flexion or extension.   Minimal anterolisthesis of L4 on L5 slightly worsens with flexion and slightly improves with extension.     Electronically Signed   By: Elester Grim M.D.   On: 11/15/2023 20:45   I have personally reviewed the images and agree with the above interpretation.  Assessment and Plan: He is s/p SCS by Dr. Debrah Fan 05/26/21. He has known left foot drop. Also with history of ACDF.   He did great after SCS with minimal pain.   He continues with constant LBP with bilateral lateral hip pain to his knees. No pain from knees to calf, but then he has pain from calf to ankles laterally. He has pain with standing and walking. He has numbness and tingling in his feet- he has known neuropathy.   He has known lumbar spondylosis and mild DDD with retrolisthesis L3-L4 and spondylolisthesis L4-L5. Slip at L4-L5 is a little worse with flexion.   SCS appears to be intact.   He has intermittent neck pain with looking up/down. No arm pain. No  numbness, tingling, or weakness in his arms. This pain is tolerable.   Known ACDF C6-C7, cervical spondylosis with slip C4-C5 and DDD C5-C6.   Treatment options discussed with patient and following plan made:   - He does not think SCS is MRI compatible. I would be surprised if it was not. Will contact [employer] rep to verity.  - If he can have MRI, will order lumbar MRI to further evaluate lumbar radiculopathy. If  not, will get CT myelogram.  - Discussed PT for lumbar spine. He declines.  - May see some improvement with injections in hips and foot over next few weeks. Follow up with ortho and podiatry as scheduled.  - Will call him about imaging once I hear back from [employer] rep.   I spent a total of 20 minutes in face-to-face and non-face-to-face activities related to this patient's care today including review of outside records, review of imaging, review of symptoms, physical exam, discussion of differential diagnosis, discussion of treatment options, and documentation.   ADDENDUM 11/29/23:  Per Adam with [employer], his spinal cord stimulator is full body MRI. He will contact patient about putting it in MRI mode. Will order lumbar MRI.   Lucetta Russel PA-C Dept. of Neurosurgery

## 2023-11-29 ENCOUNTER — Encounter: Payer: Self-pay | Admitting: Orthopedic Surgery

## 2023-11-29 ENCOUNTER — Ambulatory Visit (INDEPENDENT_AMBULATORY_CARE_PROVIDER_SITE_OTHER): Admitting: Orthopedic Surgery

## 2023-11-29 ENCOUNTER — Telehealth: Payer: Self-pay | Admitting: Orthopedic Surgery

## 2023-11-29 VITALS — BP 132/86 | Ht 67.0 in | Wt 223.0 lb

## 2023-11-29 DIAGNOSIS — M4726 Other spondylosis with radiculopathy, lumbar region: Secondary | ICD-10-CM

## 2023-11-29 DIAGNOSIS — M25552 Pain in left hip: Secondary | ICD-10-CM | POA: Diagnosis not present

## 2023-11-29 DIAGNOSIS — M5416 Radiculopathy, lumbar region: Secondary | ICD-10-CM

## 2023-11-29 DIAGNOSIS — M4316 Spondylolisthesis, lumbar region: Secondary | ICD-10-CM

## 2023-11-29 DIAGNOSIS — M542 Cervicalgia: Secondary | ICD-10-CM

## 2023-11-29 DIAGNOSIS — M47816 Spondylosis without myelopathy or radiculopathy, lumbar region: Secondary | ICD-10-CM

## 2023-11-29 DIAGNOSIS — M79671 Pain in right foot: Secondary | ICD-10-CM

## 2023-11-29 DIAGNOSIS — M51362 Other intervertebral disc degeneration, lumbar region with discogenic back pain and lower extremity pain: Secondary | ICD-10-CM

## 2023-11-29 DIAGNOSIS — M25551 Pain in right hip: Secondary | ICD-10-CM

## 2023-11-29 DIAGNOSIS — Z9689 Presence of other specified functional implants: Secondary | ICD-10-CM

## 2023-11-29 NOTE — Telephone Encounter (Signed)
 Per Adam with [employer], his spinal cord stimulator is full body MRI. I sent the patient a mychart message about this and I asked the [employer] reps if someone can show the patient how to put it in MRI mode,

## 2023-11-29 NOTE — Addendum Note (Signed)
 Addended byLucetta Russel on: 11/29/2023 05:20 PM   Modules accepted: Orders

## 2023-11-29 NOTE — Telephone Encounter (Signed)
 He had [employer] SCS by Dr. Debrah Fan on 05/26/21.   He does not think it is MRI compatible. I would be surprised if this is true.   Please call Adam with [employer] and find out if he can have lumbar MRI. If so, have them call patient to instruct him how to put SCS in MRI mode.   Please let me know so I can order imaging.   Thanks!

## 2023-11-29 NOTE — Telephone Encounter (Signed)
MRI lumbar spine ordered.

## 2023-11-29 NOTE — Telephone Encounter (Signed)
 I reached out to the [employer] reps about this. I am waiting on response

## 2023-12-01 ENCOUNTER — Encounter: Payer: Self-pay | Admitting: Family Medicine

## 2023-12-01 ENCOUNTER — Ambulatory Visit: Admitting: Family Medicine

## 2023-12-01 VITALS — BP 136/87 | HR 71 | Ht 67.0 in | Wt 214.0 lb

## 2023-12-01 DIAGNOSIS — G4733 Obstructive sleep apnea (adult) (pediatric): Secondary | ICD-10-CM | POA: Diagnosis not present

## 2023-12-01 DIAGNOSIS — Z0001 Encounter for general adult medical examination with abnormal findings: Secondary | ICD-10-CM

## 2023-12-01 DIAGNOSIS — E1151 Type 2 diabetes mellitus with diabetic peripheral angiopathy without gangrene: Secondary | ICD-10-CM

## 2023-12-01 DIAGNOSIS — G8929 Other chronic pain: Secondary | ICD-10-CM

## 2023-12-01 DIAGNOSIS — Z0000 Encounter for general adult medical examination without abnormal findings: Secondary | ICD-10-CM

## 2023-12-01 DIAGNOSIS — R296 Repeated falls: Secondary | ICD-10-CM

## 2023-12-01 MED ORDER — FREESTYLE LIBRE 3 PLUS SENSOR MISC: 6 refills | Status: DC

## 2023-12-01 NOTE — Progress Notes (Signed)
 Complete physical exam  Patient: Jonathon Newman   DOB: 25-Dec-1955   68 y.o. Male  MRN: [id number]  Subjective:    Chief Complaint  Patient presents with   Annual Exam    Last completed 09/30/22 Diet -  General, well balanced Exercise - none due to hip pain Feeling - well Sleeping - well Concerns - none     Care Management    Foot Exam - overdue since 10/02/23 Pneumococcal Vaccine - declined Tetanus Vaccines - made aware to visit pharmacy      Medication Refill    Patient reports needing jardiance  refilled    Discussed the use of AI scribe software for clinical note transcription with the patient, who gave verbal consent to proceed.  History of Present Illness Jonathon Newman is a 68 year old male who presents for an annual physical exam.  He has a history of sleep apnea and has been using a CPAP machine for over ten years, which effectively allows him to sleep through the night without issues. He has not had a sleep study in over ten years but does not feel his sleep has worsened.  He experiences chronic hip pain and is under the care of neurosurgery and orthopedics. He has undergone two back surgeries and has a stimulator in his back. He has had falls in the past year, primarily due to his legs giving out, especially on the right side, but has not sustained any injuries from these falls. Neuro and ortho managing. Ambulates with cane.  He has a history of diabetes and is currently taking metformin , Ozempic , and Jardiance . His blood sugar levels have ranged from 88 to 159 in the past month. He uses vinegar to help manage his blood sugar levels. His last A1c was 5.8. He wants to use a [employer] Plus 3 for glucose monitoring due to fingerstick discomfort.  He has a history of coronary artery disease and underwent a double bypass surgery in 2016 after experiencing symptoms of dizziness and a high blood pressure reading. He is currently taking aspirin , Lipitor, and Entresto  to  manage his condition.  He reports symptoms of neuropathy, including cold sensations in his feet and a feeling of having a 'roll of paper' under his toes, which affects his balance. He is taking Lyrica  to help manage these symptoms. He sees podiatry.   No depressive symptoms, anxiety, or changes in vision. No dental issues. He feels dizzy with deep breaths and has an echocardiogram scheduled.  Most recent fall risk assessment:    12/01/2023    9:42 AM  Fall Risk   Falls in the past year? 1  Number falls in past yr: 1  Injury with Fall? 0  Risk for fall due to : No Fall Risks  Follow up Falls evaluation completed     7 falls, unstable, legs giving out Most recent depression screenings:    12/01/2023    9:42 AM 10/12/2023    8:30 AM  PHQ 2/9 Scores  PHQ - 2 Score 0 0   Vision:Within last year and Dental: No current dental problems and Receives regular dental care  Patient Active Problem List   Diagnosis Date Noted   Chronic left hip pain 03/15/2023   Chronic right hip pain 03/15/2023   Combined arterial insufficiency and corporo-venous occlusive erectile dysfunction 12/31/2022   Numbness and tingling in left hand 09/30/2022   Hypertension associated with diabetes (HCC) 09/30/2022   CAD (coronary artery disease) 06/01/2022   Vitamin D  deficiency  06/01/2022   Chronic pharyngitis 06/01/2022   Pain in joint, forearm 06/01/2022   Pain in joint of left knee 04/20/2022   Left hip pain 12/19/2021   Chronic diastolic CHF (congestive heart failure) (HCC) 12/19/2021   Morton's neuroma of both feet 07/01/2021   DDD (degenerative disc disease), lumbosacral 07/01/2021   Psoriasis of scalp 07/01/2021   Essential hypertension 03/31/2021   Type 2 diabetes mellitus with diabetic peripheral angiopathy without gangrene, without long-term current use of insulin (HCC) 03/31/2021   Foot drop, left 03/31/2021   Hyperlipidemia associated with type 2 diabetes mellitus (HCC) 12/27/2020   T2DM (type  2 diabetes mellitus) (HCC) 07/17/2019   Strain of foot 05/09/2019   Benign paroxysmal positional vertigo due to bilateral vestibular disorder 03/15/2019   Abnormal findings on diagnostic imaging of lung 03/15/2019   Pain in right knee 09/10/2018   Unstable angina (HCC) 04/25/2018   S/P coronary artery bypass graft x 2    Low back pain 07/09/2017   Lumbar radiculopathy 07/09/2017   Gastroesophageal reflux disease with esophagitis 07/05/2017   Congestive heart failure (HCC) 07/05/2017   Benign prostatic hyperplasia with weak urinary stream 07/05/2017   Dysphagia 07/05/2017   DDD (degenerative disc disease), lumbar 07/05/2017   OSA (obstructive sleep apnea) 07/31/2015   Past Medical History:  Diagnosis Date   Allergy    Anginal pain (HCC)    Arthritis    BPH (benign prostatic hyperplasia)    CHF (congestive heart failure) (HCC)    COPD (chronic obstructive pulmonary disease) (HCC)    Coronary artery disease 2010   a.) LHC 2010: high grade RCA stenosis -> 2.5 x 26mm Cypher DES to RCA. b.) NSTEMI 2016 -> PCI revealed pat RCA stent; sig dLM Dz with FFR ratio 0.76 and oLCx stenosis; ref to CVTS. c.) 2v CABG 04/21/2015; LIMA-LAD, SVG-OM2. d.) [facility] 05/02/2018: EF 55-65%; LVEDP 14-15 mmHg; 10% p-m RCA, 30% pRCA, 20% dRCA, 95% o-pLCx, 80% m-dLM; LIMA graft pat. SVG graft with mild diffuse Dz.   DDD (degenerative disc disease), lumbar    Dyspnea    GERD (gastroesophageal reflux disease)    Hypercholesteremia    Hypertension    Left foot drop    Neuromuscular disorder (HCC)    NSTEMI (non-ST elevated myocardial infarction) (HCC) 2016   a.) PCI revealed patent RCA stent; significant dLM disease with FFR ratio 0.76 and oLCx stenosis; refer to CVTS for CABG.   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 LIMA to LAD amd SVG to OM2   CORONARY STENT PLACEMENT  2010   Cordis Cypher Sirolimus-eluting stent 2.50 mm x 26 mm placed to the RCA at [facility]    ESOPHAGEAL MANOMETRY N/A 08/04/2017   Procedure: ESOPHAGEAL MANOMETRY (EM);  Surgeon: Baldomero Bone,  Elson Halon, MD;  Location: [facility] ENDOSCOPY;  Service: Endoscopy;  Laterality: N/A;   ESOPHAGOGASTRODUODENOSCOPY (EGD) WITH PROPOFOL  N/A 06/17/2022   Procedure: ESOPHAGOGASTRODUODENOSCOPY (EGD) WITH PROPOFOL ;  Surgeon: Luke Salaam, MD;  Location: [facility] ENDOSCOPY;  Service: Gastroenterology;  Laterality: N/A;   FRACTURE SURGERY     KNEE SURGERY     KNEE SURGERY     right knee    LAMINECTOMY     plate in neck Z6-X0   LEFT HEART CATH AND CORS/GRAFTS ANGIOGRAPHY N/A 05/02/2018   Procedure: CORS/GRAFTS ANGIOGRAPHY;  Surgeon: Wenona Hamilton, MD;  Location: [facility] INVASIVE CV LAB;  Service: Cardiovascular;  Laterality: N/A;   RIGHT/LEFT HEART CATH AND CORONARY ANGIOGRAPHY Bilateral 05/02/2018   Procedure: LEFT HEART CATH;  Surgeon: Wenona Hamilton, MD;  Location: [facility] INVASIVE CV LAB;  Service: Cardiovascular;  Laterality: Bilateral;   SPINE SURGERY     THORACIC LAMINECTOMY FOR SPINAL CORD STIMULATOR N/A 05/26/2021   Procedure: THORACIC SPINAL CORD STIMULATOR AND PULSE GENERATOR PLACEMENT ([employer]TRONIC);  Surgeon: Berta Brittle, MD;  Location: [facility] ORS;  Service: Neurosurgery;  Laterality: N/A;   VASECTOMY     Social History   Tobacco Use   Smoking status: Former    Current packs/day: 0.00    Average packs/day: 0.3 packs/day for 2.0 years  (0.5 ttl pk-yrs)    Types: Cigarettes, Pipe, Cigars    Start date: 05/27/2003    Quit date: 05/26/2005    Years since quitting: 18.5   Smokeless tobacco: Former    Types: Engineer, drilling   Vaping status: Never Used  Substance Use Topics   Alcohol use: No   Drug use: No      Patient Care Team: Tasia Farr, FNP as PCP - General (Family Medicine) Devorah Fonder, MD as PCP - Cardiology (Cardiology)   Outpatient Medications Prior to Visit  Medication Sig   acetaminophen  (TYLENOL ) 500 MG tablet Take 1,000 mg by mouth every 8 (eight) hours as needed for mild pain or moderate pain. Takes once a day   aspirin  EC 81 MG tablet Take 81 mg by mouth daily. Swallow whole.   atorvastatin  (LIPITOR) 40 MG tablet TAKE 1 TABLET DAILY   bisacodyl  (DULCOLAX) 5 MG EC tablet Take 5 mg by mouth daily as needed.   Blood Glucose Monitoring Suppl (FREESTYLE LITE) DEVI To check blood sugar once daily   carvedilol  (COREG ) 12.5 MG tablet Take 1 tablet (12.5 mg total) by mouth 2 (two) times daily with a meal.   Cholecalciferol (VITAMIN D3) 50 MCG (2000 UT) capsule Take 2,000 Units by mouth daily.   clobetasol  (TEMOVATE ) 0.05 % external solution Apply 1 Application topically 2 (two) times daily.   cyanocobalamin  (VITAMIN B12) 1000 MCG tablet Take 1,000 mcg by mouth daily.   cyclobenzaprine  (FLEXERIL ) 10 MG tablet Take 1 tablet (10 mg total) by mouth 3 (three) times daily as needed for muscle spasms.   diazepam  (VALIUM ) 2 MG tablet Take 2 mg by mouth every 8 (eight) hours as needed.   ezetimibe  (ZETIA ) 10 MG tablet TAKE 1 TABLET DAILY   FREESTYLE LITE test strip USE TO CHECK BLOOD SUGAR ONCE DAILY   indomethacin  (INDOCIN ) 50 MG capsule Take 1 capsule (50 mg total) by mouth daily as needed (gout flare). Taking once daily   isosorbide  mononitrate (IMDUR ) 60 MG 24 hr tablet TAKE 1 TABLET DAILY   JARDIANCE  10 MG TABS tablet TAKE 1 TABLET DAILY BEFORE BREAKFAST   ketoconazole  (NIZORAL ) 2 % shampoo Apply  topically  2 (two) times a week.   Lancets (FREESTYLE) lancets USE TO CHECK BLOOD SUGAR ONCE DAILY   metFORMIN  (GLUCOPHAGE -XR) 500 MG 24 hr tablet TAKE 2 TABLETS TWICE A DAY WITH MEALS (NEED FOLLOW UP APPOINTMENT FOR UPDATED LABS AND FURTHER REFILLS)   Multiple Vitamin (MULTIVITAMIN WITH MINERALS) TABS tablet Take 1 tablet by mouth daily.   nitroGLYCERIN  (NITROSTAT ) 0.4 MG SL tablet DISSOLVE 1 TABLET UNDER THE TONGUE EVERY 5 MINUTES AS NEEDED FOR CHEST PAIN   pantoprazole  (PROTONIX ) 40 MG tablet TAKE 1 TABLET DAILY   potassium chloride  (KLOR-CON ) 10 MEQ tablet TAKE 2 TABLETS TWICE A DAY   pregabalin  (LYRICA ) 150 MG capsule Take 300 mg by mouth 2 (two) times daily.   Pyrithione Zinc (DERMAZINC SHAMPOO) 2 % SHAM Apply 1 Dose topically as needed.   sacubitril-valsartan (ENTRESTO ) 49-51 MG Take 1 tablet by mouth 2 (two) times daily.   Semaglutide ,0.25 or 0.5MG /DOS, (OZEMPIC , 0.25 OR 0.5 MG/DOSE,) 2 MG/3ML SOPN Inject 0.5 mg into the skin once a week. INJECT 0.5 MG UNDER THE SKIN ONCE A WEEK   torsemide  (DEMADEX ) 20 MG tablet Take 2 tablets (40 mg total) by mouth every other day.   No facility-administered medications prior to visit.    Review of Systems  All other systems reviewed and are negative.     Objective:     BP 136/87 (BP Location: Left Arm, Patient Position: Sitting, Cuff Size: Normal)   Pulse 71   Ht 5' 7 (1.702 m)   Wt 214 lb (97.1 kg)   SpO2 99%   BMI 33.52 kg/m  BP Readings from Last 3 Encounters:  12/01/23 136/87  11/29/23 132/86  11/01/23 128/84   Wt Readings from Last 3 Encounters:  12/01/23 214 lb (97.1 kg)  11/29/23 223 lb (101.2 kg)  11/01/23 223 lb (101.2 kg)     Physical Exam Constitutional:      General: He is not in acute distress.    Appearance: Normal appearance. He is overweight. He is not ill-appearing.  HENT:     Head: Normocephalic.     Right Ear: Hearing, ear canal and external ear normal. There is impacted cerumen.     Left Ear: Hearing, ear  canal and external ear normal. There is impacted cerumen.     Ears:     Comments: Cerumen build up bilateral;, not bothersome to pt.    Nose: Nose normal.     Mouth/Throat:     Mouth: Mucous membranes are moist.     Pharynx: Oropharynx is clear. No oropharyngeal exudate or posterior oropharyngeal erythema.   Eyes:     General: Lids are normal.     Extraocular Movements: Extraocular movements intact.     Right eye: Normal extraocular motion.     Left eye: Normal extraocular motion.     Conjunctiva/sclera: Conjunctivae normal.     Right eye: Right conjunctiva is not injected.     Left eye: Left conjunctiva is not injected.     Pupils: Pupils are equal, round, and reactive to light.   Neck:     Thyroid: No thyroid mass, thyromegaly or thyroid tenderness.     Vascular: No carotid bruit.   Cardiovascular:     Rate and Rhythm: Normal rate and regular rhythm.     Pulses: Normal pulses.          Radial pulses are 2+ on the right side and 2+ on the left side.       Dorsalis pedis pulses are 2+  on the right side and 2+ on the left side.       Posterior tibial pulses are 2+ on the right side and 2+ on the left side.     Heart sounds: Normal heart sounds, S1 normal and S2 normal. No murmur heard.    No friction rub. No gallop.  Pulmonary:     Effort: Pulmonary effort is normal. No respiratory distress.     Breath sounds: Normal breath sounds. No stridor. No wheezing, rhonchi or rales.  Abdominal:     General: Bowel sounds are normal. There is no distension.     Palpations: Abdomen is soft. There is no mass.     Tenderness: There is no abdominal tenderness. There is no guarding or rebound.     Hernia: No hernia is present.   Musculoskeletal:        General: No swelling or tenderness. Normal range of motion.     Cervical back: Normal range of motion. No rigidity or tenderness.     Right lower leg: No edema.     Left lower leg: No edema.  Lymphadenopathy:     Cervical: No cervical  adenopathy.     Right cervical: No superficial, deep or posterior cervical adenopathy.    Left cervical: No superficial, deep or posterior cervical adenopathy.   Skin:    General: Skin is warm and dry.     Capillary Refill: Capillary refill takes less than 2 seconds.     Findings: No bruising or erythema.   Neurological:     General: No focal deficit present.     Mental Status: He is alert and oriented to person, place, and time. Mental status is at baseline.     GCS: GCS eye subscore is 4. GCS verbal subscore is 5. GCS motor subscore is 6.     Cranial Nerves: No cranial nerve deficit.     Sensory: No sensory deficit.     Motor: No weakness, tremor or pronator drift.     Coordination: Romberg sign negative.     Gait: Gait is intact. Gait normal.     Comments: Baseline ambulates with cane due to back and hip pain. 5/5 strength when sitting, weaker with ambulation - at baseline  Psychiatric:        Attention and Perception: Attention and perception normal.        Mood and Affect: Mood normal. Affect is flat.        Speech: Speech normal.        Behavior: Behavior is cooperative.        Thought Content: Thought content normal.        Cognition and Memory: Cognition and memory normal.        Judgment: Judgment normal.      No results found for any visits on 12/01/23.     Assessment & Plan:    Routine Health Maintenance and Physical Exam  Health Maintenance  Topic Date Due   Pneumococcal Vaccine for age over 17 (2 of 2 - PCV) 04/28/2020   DTaP/Tdap/Td vaccine (2 - Td or Tdap) 08/03/2022   COVID-19 Vaccine (3 - 2024-25 season) 02/14/2023   Hemoglobin A1C  07/03/2023   Medicare Annual Wellness Visit  09/30/2023   Yearly kidney health urinalysis for diabetes  10/01/2023   Complete foot exam   10/02/2023   Yearly kidney function blood test for diabetes  12/31/2023   Flu Shot  01/14/2024   Eye exam for diabetics  03/09/2024  Colon Cancer Screening  10/27/2025   Hepatitis C  Screening  Completed   Zoster (Shingles) Vaccine  Completed   HPV Vaccine  Aged Out   Meningitis B Vaccine  Aged Out    Discussed health benefits of physical activity, and encouraged him to engage in regular exercise appropriate for his age and condition.  Annual physical exam -     Comprehensive metabolic panel with GFR -     Lipid Panel With LDL/HDL Ratio -     CBC -     PSA  Type 2 diabetes mellitus with diabetic peripheral angiopathy without gangrene, without long-term current use of insulin (HCC) -     Microalbumin / creatinine urine ratio -     Hemoglobin A1c -     FreeStyle Libre 3 Plus Sensor; Change sensor every 15 days.  Dispense: 2 each; Refill: 6  OSA (obstructive sleep apnea)  Falls  Other chronic pain    Assessment and Plan Assessment & Plan Annual Physical Examination Routine annual physical examination with no major concerns beyond existing conditions. - Perform routine annual physical examination.  Type 2 Diabetes Mellitus Type 2 diabetes managed with metformin , Ozempic , and Jardiance .  A1c at 5.8, indicating good control.  - Order [employer] Lib[employer] Plus 3 for glucose monitoring as pt would like closer and consistent monitoring of glucose levels. Also his fingers are becoming quite sensitive and painful to touch due to the pricks, which amplifies his neuropathy pain - Check A1c and other labs to assess diabetes control - Consider discontinuing metformin  or Jardiance  if A1c remains controlled -Decline foot exam - will schedule with podiatry -UTD on eye exam  Diabetic Neuropathy Symptoms include cold feet and altered sensation, likely due to past poorly controlled diabetes - Continue Lyrica  for neuropathy symptoms. - Advise on foot care and safety to prevent injuries.  Chronic Hip Pain Chronic hip pain under evaluation by neurosurgery and orthopedics. Likely due to degenerative changes. - Pt is awaiting MRI clearance due to spine stimulator  Falls: Wear  supportive shoes Clear pathways at home Be aware of rugs, cords, stairs, steps, changes in flooring. Wear indoor foot wear Use assistive device for ambulation Consider referral for PT/OT  Continue to work with neuro and ortho for management on spine and hip concerns  Obstructive Sleep Apnea Long-standing obstructive sleep apnea managed with CPAP. Reports effective management. - Consider repeat sleep study if CPAP becomes ineffective  Continue care and mgmt by Cardiologist  General Health Maintenance Things to do to keep yourself healthy  - Exercise at least 30-45 minutes a day, 3-4 days a week.  - Eat a low-fat diet with lots of fruits and vegetables, up to 7-9 servings per day.  - Seatbelts can save your life. Wear them always.  - Smoke detectors on every level of your home, check batteries every year.  - Eye Doctor - have an eye exam every 1-2 years  - Safe sex - if you may be exposed to STDs, use a condom.  - Alcohol -  If you drink, do it moderately, less than 2 drinks per day.  - Health Care Power of Attorney. Choose someone to speak for you if you are not able.  - Depression is common in our stressful world.If you're feeling down or losing interest in things you normally enjoy, please come in for a visit.  - Violence - If anyone is threatening or hurting you, please call immediately.  Follow-up Follow-up plans discussed for ongoing management of  conditions and monitoring of lab results. - Follow up in 3-4 months to reassess A1c and medication regimen. - Ensure lab results are reviewed and discussed with him.  Return in about 4 months (around 04/01/2024) for DMII mgmt.    I, Tasia Farr, FNP, have reviewed all documentation for this visit. The documentation on 12/01/23 for the exam, diagnosis, procedures, and orders are all accurate and complete.   Tasia Farr, FNP

## 2023-12-02 ENCOUNTER — Ambulatory Visit: Payer: Self-pay | Admitting: Family Medicine

## 2023-12-02 ENCOUNTER — Ambulatory Visit
Admission: RE | Admit: 2023-12-02 | Discharge: 2023-12-02 | Disposition: A | Discharge status: Home / Self Care | Payer: Medicare PPO | Source: Ambulatory Visit | Attending: Family | Admitting: Family

## 2023-12-02 ENCOUNTER — Other Ambulatory Visit: Payer: Self-pay | Admitting: Family Medicine

## 2023-12-02 ENCOUNTER — Telehealth: Payer: Self-pay

## 2023-12-02 DIAGNOSIS — R972 Elevated prostate specific antigen [PSA]: Secondary | ICD-10-CM

## 2023-12-02 DIAGNOSIS — I5032 Chronic diastolic (congestive) heart failure: Secondary | ICD-10-CM | POA: Insufficient documentation

## 2023-12-02 DIAGNOSIS — J449 Chronic obstructive pulmonary disease, unspecified: Secondary | ICD-10-CM | POA: Diagnosis not present

## 2023-12-02 LAB — ECHOCARDIOGRAM COMPLETE
AR max vel: 2.44 cm2
AV Area VTI: 2.77 cm2
AV Area mean vel: 2.46 cm2
AV Mean grad: 2 mmHg
AV Peak grad: 4.4 mmHg
Ao pk vel: 1.05 m/s
Area-P 1/2: 2.8 cm2
MV VTI: 1.74 cm2
S' Lateral: 2.7 cm

## 2023-12-02 LAB — COMPREHENSIVE METABOLIC PANEL WITH GFR
ALT: 46 IU/L — ABNORMAL HIGH (ref 0–44)
AST: 24 IU/L (ref 0–40)
Albumin: 4.7 g/dL (ref 3.9–4.9)
Alkaline Phosphatase: 86 IU/L (ref 44–121)
BUN/Creatinine Ratio: 21 (ref 10–24)
BUN: 28 mg/dL — ABNORMAL HIGH (ref 8–27)
Bilirubin Total: 0.5 mg/dL (ref 0.0–1.2)
CO2: 16 mmol/L — ABNORMAL LOW (ref 20–29)
Calcium: 10 mg/dL (ref 8.6–10.2)
Chloride: 102 mmol/L (ref 96–106)
Creatinine, Ser: 1.31 mg/dL — ABNORMAL HIGH (ref 0.76–1.27)
Globulin, Total: 2.7 g/dL (ref 1.5–4.5)
Glucose: 90 mg/dL (ref 70–99)
Potassium: 4.7 mmol/L (ref 3.5–5.2)
Sodium: 138 mmol/L (ref 134–144)
Total Protein: 7.4 g/dL (ref 6.0–8.5)
eGFR: 60 mL/min/{1.73_m2} (ref >59)

## 2023-12-02 LAB — LIPID PANEL WITH LDL/HDL RATIO
Cholesterol, Total: 174 mg/dL (ref 100–199)
HDL: 40 mg/dL (ref >39)
LDL Chol Calc (NIH): 74 mg/dL (ref 0–99)
LDL/HDL Ratio: 1.9 ratio (ref 0.0–3.6)
Triglycerides: 376 mg/dL — ABNORMAL HIGH (ref 0–149)
VLDL Cholesterol Cal: 60 mg/dL — ABNORMAL HIGH (ref 5–40)

## 2023-12-02 LAB — CBC
Hematocrit: 47 % (ref 37.5–51.0)
Hemoglobin: 15.8 g/dL (ref 13.0–17.7)
MCH: 31.8 pg (ref 26.6–33.0)
MCHC: 33.6 g/dL (ref 31.5–35.7)
MCV: 95 fL (ref 79–97)
Platelets: 186 10*3/uL (ref 150–450)
RBC: 4.97 x10E6/uL (ref 4.14–5.80)
RDW: 15.3 % (ref 11.6–15.4)
WBC: 6.8 10*3/uL (ref 3.4–10.8)

## 2023-12-02 LAB — MICROALBUMIN / CREATININE URINE RATIO
Creatinine, Urine: 86.5 mg/dL
Microalb/Creat Ratio: 34 mg/g{creat} — ABNORMAL HIGH (ref 0–29)
Microalbumin, Urine: 29.7 ug/mL

## 2023-12-02 LAB — PSA: Prostate Specific Ag, Serum: 6.4 ng/mL — ABNORMAL HIGH (ref 0.0–4.0)

## 2023-12-02 LAB — HEMOGLOBIN A1C
Est. average glucose Bld gHb Est-mCnc: 128 mg/dL
Hgb A1c MFr Bld: 6.1 % — ABNORMAL HIGH (ref 4.8–5.6)

## 2023-12-02 NOTE — Progress Notes (Signed)
*  PRELIMINARY RESULTS* Echocardiogram 2D Echocardiogram has been performed.  Jonathon Newman 12/02/2023, 10:17 AM

## 2023-12-02 NOTE — Telephone Encounter (Signed)
 Copied from CRM [id number]. Topic: General - Other >> Dec 02, 2023  9:15 AM Elgin Grit wrote: Reason for CRM: patient walked in stating that [facility] his prescription that was just placed yesterday Continuous Glucose Sensor (FREESTYLE LIBRE 3 PLUS SENSOR) MISC. Pharmacy needs call back today and patient would like confirmation call once this is taken care of. [facility] [phone]

## 2023-12-03 ENCOUNTER — Other Ambulatory Visit (HOSPITAL_COMMUNITY): Payer: Self-pay

## 2023-12-06 ENCOUNTER — Other Ambulatory Visit: Payer: Self-pay | Admitting: Family Medicine

## 2023-12-06 DIAGNOSIS — E1151 Type 2 diabetes mellitus with diabetic peripheral angiopathy without gangrene: Secondary | ICD-10-CM

## 2023-12-09 ENCOUNTER — Other Ambulatory Visit (HOSPITAL_COMMUNITY): Payer: Self-pay

## 2023-12-10 ENCOUNTER — Other Ambulatory Visit (HOSPITAL_COMMUNITY): Payer: Self-pay

## 2023-12-10 ENCOUNTER — Encounter: Payer: Self-pay | Admitting: Family Medicine

## 2023-12-10 ENCOUNTER — Telehealth: Payer: Self-pay

## 2023-12-10 ENCOUNTER — Telehealth: Payer: Self-pay | Admitting: Family

## 2023-12-10 NOTE — Telephone Encounter (Signed)
 Called to confirm/remind patient of [facility] on [facility].   Appointment:   [] Confirmed  [x] Left mess   [] No answer/No voice mail  [] VM Full/unable to leave message  [] Phone not in service  Patient reminded to bring all medications and/or complete list.  Confirmed patient has transportation. Gave directions, instructed to utilize valet parking.

## 2023-12-10 NOTE — Telephone Encounter (Signed)
*  Primary  Pharmacy Patient Advocate Encounter  Received notification from [employer] that Prior Authorization for FreeStyle Libre CGM has been CANCELLED due to not covered due to patient not using daily basal or prandial insulin.   *copy of PA form in patients media

## 2023-12-10 NOTE — Telephone Encounter (Signed)
 Noted! Thank you

## 2023-12-12 NOTE — Progress Notes (Unsigned)
 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   He is retired from [employer] though is no longer eligible for his care through the [employer]. He previously underwent Cypher drug-eluting stent to the RCA in 2010. This was followed by a NSTEMI in 2016 with cardiac cath showing a patent RCA stent with significant distal left main disease with an FFR of 0.76 and ostial LCx stenosis. He underwent 2-vessel CABG with a LIMA to LAD and SVG to OM2 in 2016. He was seen in 06/2017 for atypical chest pain and exertional dyspnea. He underwent Lexiscan  Myoview  on 07/08/2017 that showed no evidence of ischemia with a normal EF.  He underwent diagnostic R/LHC on 05/02/2018 that showed significant underlying left main disease with patent LIMA to LAD and SVG to OM2. The RCA stent was patent with minimal restenosis. He had normal LVSF with a mildly elevated LVEDP at 14-15 mmHg. There was no cardiac culprit for his symptoms based on this cath.   Echo 12/11/21: EF of 55-60%  Echo 12/02/23 has not been read yet.   He presents today for a HF visit with a chief complaint of shortness of breath. Has associated fatigue, numbness/ tingling in several fingers in both hands, chronic low back pain. Denies chest pain, cough, palpitations, abdominal distention, pedal edema. Glucose this morning was 126. Says that there's been discussion about doing a lower back MRI.    Previous cardiac studies:  Lexiscan  MPI in 10/2019, showed no evidence of ischemia with an EF of 52%  Lexiscan  MPI in 02/2022 showed  no significant ischemia with an EF of 62% and was overall low risk   Carotid artery ultrasound in 03/2022 showed near normal bilateral internal carotid arteries with anterograde flow of the bilateral vertebral arteries and normal flow hemodynamics of the bilateral subclavian arteries.   ROS: All systems negative except as listed in HPI, PMH and Problem List.  SH:  Social History   Socioeconomic History   Marital status: Divorced    Spouse name: Not on file   Number of children: Not on file   Years of education: Not on file   Highest education level: Some college, no degree  Occupational History   Not on file  Tobacco Use   Smoking status: Former    Current packs/day: 0.00    Average packs/day: 0.3 packs/day for 2.0 years (0.5 ttl pk-yrs)    Types: Cigarettes, Pipe, Cigars    Start date: 05/27/2003    Quit date: 05/26/2005    Years since quitting: 18.5   Smokeless tobacco: Former    Types: Associate Professor status: Never Used  Substance and Sexual Activity   Alcohol use: No   Drug use: No   Sexual activity: Not Currently    Birth control/protection: Abstinence, Surgical  Other Topics Concern   Not on file  Social History Narrative   Lives at home with Santana (friend)    Social Drivers of Health   Financial Resource Strain: Low Risk  (11/27/2023)  Overall Financial Resource Strain (CARDIA)    Difficulty of Paying Living Expenses: Not hard at all  Food Insecurity: No Food Insecurity (11/27/2023)   Hunger Vital Sign    Worried About Running Out of Food in the Last Year: Never true    Ran Out of Food in the Last Year: Never true  Transportation Needs: No Transportation Needs (11/27/2023)   PRAPARE - Administrator, Civil Service (Medical): No    Lack of Transportation (Non-Medical): No  Physical Activity: Insufficiently Active (11/27/2023)   Exercise Vital Sign    Days of Exercise per Week: 1 day    Minutes of Exercise per Session: 10 min  Stress: No  Stress Concern Present (11/27/2023)   [employer] of Occupational Health - Occupational Stress Questionnaire    Feeling of Stress: Not at all  Social Connections: Moderately Isolated (11/27/2023)   Social Connection and Isolation Panel    Frequency of Communication with Friends and Family: Once a week    Frequency of Social Gatherings with Friends and Family: Once a week    Attends Religious Services: More than 4 times per year    Active Member of [employer] or Organizations: Yes    Attends Engineer, structural: More than 4 times per year    Marital Status: Divorced  Intimate Partner Violence: Not At Risk (12/01/2023)   Humiliation, Afraid, Rape, and Kick questionnaire    Fear of Current or Ex-Partner: No    Emotionally Abused: No    Physically Abused: No    Sexually Abused: No    FH:  Family History  Problem Relation Age of Onset   Arthritis Mother    Hyperlipidemia Father    Hypertension Father    Heart attack Father 91   Heart disease Father    Heart murmur Brother    Diabetes Brother    Valvular heart disease Brother    Obesity Brother    Cataracts Maternal Grandmother    Glaucoma Maternal Grandmother    Cancer Maternal Grandfather    Heart attack Paternal Grandfather    Hypertension Paternal Grandfather    Prostate cancer Neg Hx    Bladder Cancer Neg Hx    Kidney cancer Neg Hx     Past Medical History:  Diagnosis Date   Allergy    Anginal pain (HCC)    Arthritis    BPH (benign prostatic hyperplasia)    CHF (congestive heart failure) (HCC)    COPD (chronic obstructive pulmonary disease) (HCC)    Coronary artery disease 2010   a.) LHC 2010: high grade RCA stenosis -> 2.5 x 26mm Cypher DES to RCA. b.) NSTEMI 2016 -> PCI revealed pat RCA stent; sig dLM Dz with FFR ratio 0.76 and oLCx stenosis; ref to CVTS. c.) 2v CABG 04/21/2015; LIMA-LAD, SVG-OM2. d.) [facility] 05/02/2018: EF 55-65%; LVEDP 14-15 mmHg; 10% p-m RCA, 30% pRCA, 20% dRCA, 95% o-pLCx, 80% m-dLM; LIMA  graft pat. SVG graft with mild diffuse Dz.   DDD (degenerative disc disease), lumbar    Dyspnea    GERD (gastroesophageal reflux disease)    Hypercholesteremia    Hypertension    Left foot drop    Neuromuscular disorder (HCC)    NSTEMI (non-ST elevated myocardial infarction) (HCC) 2016   a.) PCI revealed patent RCA stent; significant dLM disease with FFR ratio 0.76 and oLCx stenosis; refer to CVTS for CABG.   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  a.) 2v CABG; LIMA-LAD, SVG-OM2   Sleep apnea    T2DM (type 2 diabetes mellitus) (HCC)     Current Outpatient Medications  Medication Sig Dispense Refill   acetaminophen  (TYLENOL ) 500 MG tablet Take 1,000 mg by mouth every 8 (eight) hours as needed for mild pain or moderate pain. Takes once a day     aspirin  EC 81 MG tablet Take 81 mg by mouth daily. Swallow whole.     atorvastatin  (LIPITOR) 40 MG tablet TAKE 1 TABLET DAILY 90 tablet 3   bisacodyl  (DULCOLAX) 5 MG EC tablet Take 5 mg by mouth daily as needed.     Blood Glucose Monitoring Suppl (FREESTYLE LITE) DEVI To check blood sugar once daily 1 each 0   carvedilol  (COREG ) 12.5 MG tablet Take 1 tablet (12.5 mg total) by mouth 2 (two) times daily with a meal. 180 tablet 3   Cholecalciferol (VITAMIN D3) 50 MCG (2000 UT) capsule Take 2,000 Units by mouth daily.     clobetasol  (TEMOVATE ) 0.05 % external solution Apply 1 Application topically 2 (two) times daily. 50 mL 11   Continuous Glucose Sensor (FREESTYLE LIBRE 3 PLUS SENSOR) MISC Change sensor every 15 days. 2 each 6   cyanocobalamin  (VITAMIN B12) 1000 MCG tablet Take 1,000 mcg by mouth daily.     cyclobenzaprine  (FLEXERIL ) 10 MG tablet Take 1 tablet (10 mg total) by mouth 3 (three) times daily as needed for muscle spasms. 90 tablet 1   diazepam  (VALIUM ) 2 MG tablet Take 2 mg by mouth every 8 (eight) hours as needed.     ezetimibe  (ZETIA ) 10 MG tablet TAKE 1 TABLET DAILY 90 tablet 3   FREESTYLE LITE test  strip USE TO CHECK BLOOD SUGAR ONCE DAILY 100 strip 3   indomethacin  (INDOCIN ) 50 MG capsule Take 1 capsule (50 mg total) by mouth daily as needed (gout flare). Taking once daily 90 capsule 1   isosorbide  mononitrate (IMDUR ) 60 MG 24 hr tablet TAKE 1 TABLET DAILY 90 tablet 3   JARDIANCE  10 MG TABS tablet TAKE 1 TABLET DAILY BEFORE BREAKFAST 30 tablet 11   ketoconazole  (NIZORAL ) 2 % shampoo Apply topically 2 (two) times a week. 120 mL 3   Lancets (FREESTYLE) lancets USE TO CHECK BLOOD SUGAR ONCE DAILY 100 each 4   metFORMIN  (GLUCOPHAGE -XR) 500 MG 24 hr tablet TAKE 2 TABLETS TWICE A DAY WITH MEALS (NEED FOLLOW UP APPOINTMENT FOR UPDATED LABS AND FURTHER REFILLS) 120 tablet 11   Multiple Vitamin (MULTIVITAMIN WITH MINERALS) TABS tablet Take 1 tablet by mouth daily.     nitroGLYCERIN  (NITROSTAT ) 0.4 MG SL tablet DISSOLVE 1 TABLET UNDER THE TONGUE EVERY 5 MINUTES AS NEEDED FOR CHEST PAIN 25 tablet 5   OZEMPIC , 0.25 OR 0.5 MG/DOSE, 2 MG/3ML SOPN INJECT 0.5 MG UNDER THE SKIN ONCE A WEEK 9 mL 3   pantoprazole  (PROTONIX ) 40 MG tablet TAKE 1 TABLET DAILY 90 tablet 3   potassium chloride  (KLOR-CON ) 10 MEQ tablet TAKE 2 TABLETS TWICE A DAY 360 tablet 3   pregabalin  (LYRICA ) 150 MG capsule Take 300 mg by mouth 2 (two) times daily.     Pyrithione Zinc (DERMAZINC SHAMPOO) 2 % SHAM Apply 1 Dose topically as needed. 480 mL 0   sacubitril-valsartan (ENTRESTO ) 49-51 MG Take 1 tablet by mouth 2 (two) times daily. 180 tablet 3   torsemide  (DEMADEX ) 20 MG tablet Take 2 tablets (40 mg total) by mouth every other day. 90 tablet 3   No current facility-administered medications for  this visit.   Vitals:   12/13/23 0844  BP: 115/77  Pulse: 77  SpO2: 96%  Weight: 214 lb (97.1 kg)   Wt Readings from Last 3 Encounters:  12/13/23 214 lb (97.1 kg)  12/01/23 214 lb (97.1 kg)  11/29/23 223 lb (101.2 kg)   Lab Results  Component Value Date   CREATININE 1.31 (H) 12/01/2023   CREATININE 1.66 (H) 12/31/2022    CREATININE 1.97 (H) 09/30/2022    PHYSICAL EXAM:  General: Well appearing. No resp difficulty HEENT: normal Neck: supple, no JVD Cor: Regular rhythm, rate. No rubs, gallops or murmurs Lungs: clear Abdomen: soft, nontender, nondistended. Extremities: no cyanosis, clubbing, rash, edema Neuro: alert & oriented X 3. Moves all 4 extremities w/o difficulty. Affect pleasant   ECG: not done   ASSESSMENT & PLAN:  1: Ischemic heart failure with preserved ejection fraction- - Etiology: CAD w/ previous CABG - NYHA class III - euvolemic today - weighing daily; reminded to call for an overnight weight gain of > 2 pounds or a weekly weight gain of > 5 pounds - weight stable from last visit here 6 months ago - Echo 12/11/21: EF of 55-60% - recent echo has not been read yet - continue carvedilol  12.5mg  BID - continue jardiance  10mg  daily - continue entresto  49/51mg  BID - continue torsemide  40mg  every other day - using NoSalt although will use regular salt on watermelon/ cantaloupe - knows to keep daily fluid intake to 64 ounces but says that he can drink more than that due to a dry mouth - BNP 01/06/22 was 73.0  2: HTN- - BP 115/77 - saw PCP 06/25 - saw nephrology Jil) 05/25 - BMP 12/01/23 reviewed: sodium 138, potassium 4.7, creatinine 1.31 and GFR 60; repeat in a few months  3: DM- - A1c 12/01/23 was 6.1% (rising) - instructed to f/u with PCP regarding numbness/ tingling in fingertips as it sounds like neuropathy  4: CAD- - saw cardiology Florestine) 03/25 - LHC 05/02/18:  The left ventricular systolic function is normal. LV end diastolic pressure is mildly elevated. The left ventricular ejection fraction is 55-65% by visual estimate. Prox RCA to Mid RCA lesion is 10% stenosed. Prox RCA lesion is 30% stenosed. Dist RCA lesion is 20% stenosed. SVG graft was visualized by angiography. The graft exhibits mild diffuse disease. Ost Cx to Prox Cx lesion is 95% stenosed. Mid LM to  Dist LM lesion is 80% stenosed. LIMA and is normal in caliber. The graft exhibits no disease.  1. Significant underlying left main and RCA disease with patent grafts including LIMA to LAD and SVG to OM 2.  Patent RCA stent with minimal restenosis. 2.  Normal LV systolic function.  Mildly elevated left ventricular end-diastolic pressure at 14 to 15 mmHg.  5: Hyperlipidemia- - LDL 12/01/23 was 74 - triglycerides 12/01/23 was 376; has been as high as 775 a few years ago - increase atorvastatin  to 80mg  daily - continue ezetimibe  10mg  daily - repeat lipid panel in a few months  6: OSA- - has worn CPAP for >20 years    Ellouise DELENA Class, [place] 12/12/23

## 2023-12-13 ENCOUNTER — Ambulatory Visit: Payer: Medicare PPO | Attending: Family | Admitting: Family

## 2023-12-13 ENCOUNTER — Other Ambulatory Visit (HOSPITAL_COMMUNITY): Payer: Self-pay

## 2023-12-13 ENCOUNTER — Encounter: Payer: Self-pay | Admitting: Family

## 2023-12-13 VITALS — BP 115/77 | HR 77 | Wt 214.0 lb

## 2023-12-13 DIAGNOSIS — E1122 Type 2 diabetes mellitus with diabetic chronic kidney disease: Secondary | ICD-10-CM | POA: Insufficient documentation

## 2023-12-13 DIAGNOSIS — Z951 Presence of aortocoronary bypass graft: Secondary | ICD-10-CM | POA: Insufficient documentation

## 2023-12-13 DIAGNOSIS — I5030 Unspecified diastolic (congestive) heart failure: Secondary | ICD-10-CM | POA: Insufficient documentation

## 2023-12-13 DIAGNOSIS — G8929 Other chronic pain: Secondary | ICD-10-CM | POA: Diagnosis not present

## 2023-12-13 DIAGNOSIS — Z7984 Long term (current) use of oral hypoglycemic drugs: Secondary | ICD-10-CM | POA: Diagnosis not present

## 2023-12-13 DIAGNOSIS — E1151 Type 2 diabetes mellitus with diabetic peripheral angiopathy without gangrene: Secondary | ICD-10-CM

## 2023-12-13 DIAGNOSIS — Z79899 Other long term (current) drug therapy: Secondary | ICD-10-CM | POA: Insufficient documentation

## 2023-12-13 DIAGNOSIS — Z955 Presence of coronary angioplasty implant and graft: Secondary | ICD-10-CM | POA: Insufficient documentation

## 2023-12-13 DIAGNOSIS — I10 Essential (primary) hypertension: Secondary | ICD-10-CM

## 2023-12-13 DIAGNOSIS — R202 Paresthesia of skin: Secondary | ICD-10-CM | POA: Diagnosis not present

## 2023-12-13 DIAGNOSIS — N400 Enlarged prostate without lower urinary tract symptoms: Secondary | ICD-10-CM | POA: Diagnosis not present

## 2023-12-13 DIAGNOSIS — I2510 Atherosclerotic heart disease of native coronary artery without angina pectoris: Secondary | ICD-10-CM | POA: Insufficient documentation

## 2023-12-13 DIAGNOSIS — M545 Low back pain, unspecified: Secondary | ICD-10-CM | POA: Diagnosis not present

## 2023-12-13 DIAGNOSIS — G4730 Sleep apnea, unspecified: Secondary | ICD-10-CM

## 2023-12-13 DIAGNOSIS — Z87891 Personal history of nicotine dependence: Secondary | ICD-10-CM | POA: Insufficient documentation

## 2023-12-13 DIAGNOSIS — G4733 Obstructive sleep apnea (adult) (pediatric): Secondary | ICD-10-CM | POA: Diagnosis not present

## 2023-12-13 DIAGNOSIS — R200 Anesthesia of skin: Secondary | ICD-10-CM | POA: Diagnosis not present

## 2023-12-13 DIAGNOSIS — Z7982 Long term (current) use of aspirin: Secondary | ICD-10-CM | POA: Insufficient documentation

## 2023-12-13 DIAGNOSIS — F17290 Nicotine dependence, other tobacco product, uncomplicated: Secondary | ICD-10-CM | POA: Diagnosis not present

## 2023-12-13 DIAGNOSIS — I5032 Chronic diastolic (congestive) heart failure: Secondary | ICD-10-CM | POA: Diagnosis not present

## 2023-12-13 DIAGNOSIS — I252 Old myocardial infarction: Secondary | ICD-10-CM | POA: Diagnosis not present

## 2023-12-13 DIAGNOSIS — E785 Hyperlipidemia, unspecified: Secondary | ICD-10-CM | POA: Diagnosis not present

## 2023-12-13 DIAGNOSIS — I130 Hypertensive heart and chronic kidney disease with heart failure and stage 1 through stage 4 chronic kidney disease, or unspecified chronic kidney disease: Secondary | ICD-10-CM | POA: Insufficient documentation

## 2023-12-13 DIAGNOSIS — R682 Dry mouth, unspecified: Secondary | ICD-10-CM | POA: Insufficient documentation

## 2023-12-13 DIAGNOSIS — Z7985 Long-term (current) use of injectable non-insulin antidiabetic drugs: Secondary | ICD-10-CM | POA: Insufficient documentation

## 2023-12-13 DIAGNOSIS — I25118 Atherosclerotic heart disease of native coronary artery with other forms of angina pectoris: Secondary | ICD-10-CM

## 2023-12-13 MED ORDER — ATORVASTATIN CALCIUM 80 MG PO TABS: 80.0000 mg | ORAL_TABLET | Freq: Every day | ORAL | Status: AC

## 2023-12-13 NOTE — Patient Instructions (Addendum)
 Medication Changes:  Increase your lipitor (atorvastatin ) to 80mg  daily. You can finish your current bottle by taking 2 tablets daily. When you get close to needing more medications, let us  know and we will send in the 80mg  prescription.    Lab Work:  [facility] over to the [facility]. Go pass the gift shop and have your blood work completed in October or September.  We will only call you if the results are abnormal or if the provider would like to make medication changes.   Follow-Up in: 6 months with Jonathon Class, FNP.  At the [facility], you and your health needs are our priority. We have a designated team specialized in the treatment of Heart Failure. This Care Team includes your primary Heart Failure Specialized Cardiologist (physician), Advanced Practice Providers (APPs- Physician Assistants and Nurse Practitioners), and Pharmacist who all work together to provide you with the care you need, when you need it.   You may see any of the following providers on your designated Care Team at your next follow up:  Dr. Toribio Fuel Dr. Ezra Shuck Dr. Ria Commander Dr. Odis Brownie Jonathon Class, FNP Jonathon Newman, RPH-CPP  Please be sure to bring in all your medications bottles to every appointment.   Need to Contact Us :  If you have any questions or concerns before your next appointment please send us  a message through [place] or call our office at [phone].    TO LEAVE A MESSAGE FOR THE NURSE SELECT OPTION 2, PLEASE LEAVE A MESSAGE INCLUDING: YOUR NAME DATE OF BIRTH CALL BACK NUMBER REASON FOR CALL**this is important as we prioritize the call backs  YOU WILL RECEIVE A CALL BACK THE SAME DAY AS LONG AS YOU CALL BEFORE 4:00 PM

## 2023-12-23 ENCOUNTER — Ambulatory Visit (INDEPENDENT_AMBULATORY_CARE_PROVIDER_SITE_OTHER): Admitting: Podiatry

## 2023-12-23 DIAGNOSIS — M7671 Peroneal tendinitis, right leg: Secondary | ICD-10-CM

## 2023-12-23 MED ORDER — METHYLPREDNISOLONE 4 MG PO TBPK
ORAL_TABLET | ORAL | 0 refills | Status: DC
Start: 2023-12-23 — End: 2024-01-06

## 2023-12-23 NOTE — Progress Notes (Signed)
 Subjective:  Patient ID: Jonathon Newman, male    DOB: 05-Jun-1956,  MRN: [id number]  Chief Complaint  Patient presents with   Foot Pain    Pt stated that he is still having some pain and discomfort     68 y.o. male presents with the above complaint.  Patient presents for follow-up of right lateral foot peroneal tendinitis.  He states is about the same injection did help brace did not help he wants to discuss next treatment plan   Review of Systems: Negative except as noted in the HPI. Denies N/V/F/Ch.  Past Medical History:  Diagnosis Date   Allergy    Anginal pain (HCC)    Arthritis    BPH (benign prostatic hyperplasia)    CHF (congestive heart failure) (HCC)    COPD (chronic obstructive pulmonary disease) (HCC)    Coronary artery disease 2010   a.) LHC 2010: high grade RCA stenosis -> 2.5 x 26mm Cypher DES to RCA. b.) NSTEMI 2016 -> PCI revealed pat RCA stent; sig dLM Dz with FFR ratio 0.76 and oLCx stenosis; ref to CVTS. c.) 2v CABG 04/21/2015; LIMA-LAD, SVG-OM2. d.) [facility] 05/02/2018: EF 55-65%; LVEDP 14-15 mmHg; 10% p-m RCA, 30% pRCA, 20% dRCA, 95% o-pLCx, 80% m-dLM; LIMA graft pat. SVG graft with mild diffuse Dz.   DDD (degenerative disc disease), lumbar    Dyspnea    GERD (gastroesophageal reflux disease)    Hypercholesteremia    Hypertension    Left foot drop    Neuromuscular disorder (HCC)    NSTEMI (non-ST elevated myocardial infarction) (HCC) 2016   a.) PCI revealed patent RCA stent; significant dLM disease with FFR ratio 0.76 and oLCx stenosis; refer to CVTS for CABG.   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   a.) 2v CABG; LIMA-LAD, SVG-OM2   Sleep apnea    T2DM (type 2 diabetes mellitus) (HCC)     Current Outpatient Medications:    methylPREDNISolone  (MEDROL  DOSEPAK) 4 MG TBPK tablet, Take as directed, Disp: 21 each, Rfl: 0   acetaminophen  (TYLENOL ) 500 MG tablet, Take 1,000 mg by mouth every 8 (eight) hours as needed for mild pain or  moderate pain. Takes once a day, Disp: , Rfl:    aspirin  EC 81 MG tablet, Take 81 mg by mouth daily. Swallow whole., Disp: , Rfl:    atorvastatin  (LIPITOR) 80 MG tablet, Take 1 tablet (80 mg total) by mouth daily., Disp: , Rfl:    bisacodyl  (DULCOLAX) 5 MG EC tablet, Take 5 mg by mouth daily as needed., Disp: , Rfl:    Blood Glucose Monitoring Suppl (FREESTYLE LITE) DEVI, To check blood sugar once daily, Disp: 1 each, Rfl: 0   carvedilol  (COREG ) 12.5 MG tablet, Take 1 tablet (12.5 mg total) by mouth 2 (two) times daily with a meal., Disp: 180 tablet, Rfl: 3   Cholecalciferol (VITAMIN D3) 50 MCG (2000 UT) capsule, Take 2,000 Units by mouth daily., Disp: , Rfl:    clobetasol  (TEMOVATE ) 0.05 % external solution, Apply 1 Application topically 2 (two) times daily., Disp: 50 mL, Rfl: 11   Continuous Glucose Sensor (FREESTYLE LIBRE 3 PLUS SENSOR) MISC, Change sensor every 15 days., Disp: 2 each, Rfl: 6   cyanocobalamin  (VITAMIN B12) 1000 MCG tablet, Take 1,000 mcg by mouth daily., Disp: , Rfl:    cyclobenzaprine  (FLEXERIL ) 10 MG tablet, Take 1 tablet (10 mg total) by mouth 3 (three) times daily as needed for muscle spasms., Disp:  90 tablet, Rfl: 1   diazepam  (VALIUM ) 2 MG tablet, Take 2 mg by mouth every 8 (eight) hours as needed., Disp: , Rfl:    ezetimibe  (ZETIA ) 10 MG tablet, TAKE 1 TABLET DAILY, Disp: 90 tablet, Rfl: 3   FREESTYLE LITE test strip, USE TO CHECK BLOOD SUGAR ONCE DAILY, Disp: 100 strip, Rfl: 3   indomethacin  (INDOCIN ) 50 MG capsule, Take 1 capsule (50 mg total) by mouth daily as needed (gout flare). Taking once daily, Disp: 90 capsule, Rfl: 1   isosorbide  mononitrate (IMDUR ) 60 MG 24 hr tablet, TAKE 1 TABLET DAILY, Disp: 90 tablet, Rfl: 3   JARDIANCE  10 MG TABS tablet, TAKE 1 TABLET DAILY BEFORE BREAKFAST, Disp: 30 tablet, Rfl: 11   ketoconazole  (NIZORAL ) 2 % shampoo, Apply topically 2 (two) times a week., Disp: 120 mL, Rfl: 3   Lancets (FREESTYLE) lancets, USE TO CHECK BLOOD SUGAR ONCE  DAILY, Disp: 100 each, Rfl: 4   metFORMIN  (GLUCOPHAGE -XR) 500 MG 24 hr tablet, TAKE 2 TABLETS TWICE A DAY WITH MEALS (NEED FOLLOW UP APPOINTMENT FOR UPDATED LABS AND FURTHER REFILLS), Disp: 120 tablet, Rfl: 11   Multiple Vitamin (MULTIVITAMIN WITH MINERALS) TABS tablet, Take 1 tablet by mouth daily., Disp: , Rfl:    nitroGLYCERIN  (NITROSTAT ) 0.4 MG SL tablet, DISSOLVE 1 TABLET UNDER THE TONGUE EVERY 5 MINUTES AS NEEDED FOR CHEST PAIN, Disp: 25 tablet, Rfl: 5   OZEMPIC , 0.25 OR 0.5 MG/DOSE, 2 MG/3ML SOPN, INJECT 0.5 MG UNDER THE SKIN ONCE A WEEK, Disp: 9 mL, Rfl: 3   pantoprazole  (PROTONIX ) 40 MG tablet, TAKE 1 TABLET DAILY, Disp: 90 tablet, Rfl: 3   potassium chloride  (KLOR-CON ) 10 MEQ tablet, TAKE 2 TABLETS TWICE A DAY, Disp: 360 tablet, Rfl: 3   pregabalin  (LYRICA ) 100 MG capsule, Take 100 mg by mouth 3 (three) times daily., Disp: , Rfl:    Pyrithione Zinc (DERMAZINC SHAMPOO) 2 % SHAM, Apply 1 Dose topically as needed., Disp: 480 mL, Rfl: 0   sacubitril-valsartan (ENTRESTO ) 49-51 MG, Take 1 tablet by mouth 2 (two) times daily., Disp: 180 tablet, Rfl: 3   torsemide  (DEMADEX ) 20 MG tablet, Take 2 tablets (40 mg total) by mouth every other day., Disp: 90 tablet, Rfl: 3  Social History   Tobacco Use  Smoking Status Former   Current packs/day: 0.00   Average packs/day: 0.3 packs/day for 2.0 years (0.5 ttl pk-yrs)   Types: Cigarettes, Pipe, Cigars   Start date: 05/27/2003   Quit date: 05/26/2005   Years since quitting: 18.5  Smokeless Tobacco Former   Types: Chew    Allergies  Allergen Reactions   Ace Inhibitors Other (See Comments)   Colchicine  Other (See Comments)    Palpitations and headache   Lisinopril Cough   Objective:  There were no vitals filed for this visit. There is no height or weight on file to calculate BMI. Constitutional Well developed. Well nourished.  Vascular Dorsalis pedis pulses palpable bilaterally. Posterior tibial pulses palpable bilaterally. Capillary  refill normal to all digits.  No cyanosis or clubbing noted. Pedal hair growth normal.  Neurologic Normal speech. Oriented to person, place, and time. Epicritic sensation to light touch grossly present bilaterally.  Dermatologic Nails well groomed and normal in appearance. No open wounds. No skin lesions.  Orthopedic: Pain on palpation to the right right lateral foot along the course of the peroneal tendon proximally to the fibula.  Pain with dorsiflexion eversion of the foot resisted no pain with plantarflexion inversion of the foot no  pain at the Achilles tendon posterior tibial tendon ATFL ligament.  No pain at the ankle joint no deep intra-articular ankle pain noted.  Pes planovalgus foot structure noted.   Radiographs: None Assessment:   1. Peroneal tendinitis of lower leg, right    Plan:  Patient was evaluated and treated and all questions answered.  Right peroneal tendinitis with underlying pes planovalgus - All questions or concerns were discussed with the patient in extensive detail clinically injection did not help he has gait abnormality and therefore unable to wear boot.  At this time given that he is failing all conservative care would benefit from an MRI evaluation to rule out a tear - MRI was ordered. - Medrol  Dosepak was dispensed to the pharmacy  No follow-ups on file.

## 2023-12-24 ENCOUNTER — Ambulatory Visit
Admission: RE | Admit: 2023-12-24 | Discharge: 2023-12-24 | Disposition: A | Discharge status: Home / Self Care | Source: Ambulatory Visit | Attending: Orthopedic Surgery | Admitting: Orthopedic Surgery

## 2023-12-24 DIAGNOSIS — Z9689 Presence of other specified functional implants: Secondary | ICD-10-CM | POA: Diagnosis present

## 2023-12-24 DIAGNOSIS — M5416 Radiculopathy, lumbar region: Secondary | ICD-10-CM | POA: Diagnosis present

## 2023-12-24 DIAGNOSIS — M47816 Spondylosis without myelopathy or radiculopathy, lumbar region: Secondary | ICD-10-CM | POA: Diagnosis present

## 2023-12-27 NOTE — Progress Notes (Unsigned)
 Referring Physician:  Wellington Curtis LABOR, FNP [address] [place],  [place] [address]  Primary Physician:  Wellington Curtis LABOR, FNP  History of Present Illness: 12/28/2023 [name] has a history of CHF, HTN, DM, CAD, OSA, hyperlipidemia, vertigo, BPH, CABG x 2, left foot drop, NSTEMI.   He is s/p SCS by Dr. Bluford 05/26/21. He has known left foot drop. Also with history of ACDF.   He did great after SCS with minimal pain.   Last seen by me on 11/29/23 for constant LBP with bilateral lateral hip pain to his knees. No pain from knees to calf, but then he has pain from calf to ankles laterally.   He has known lumbar spondylosis and mild DDD with retrolisthesis L3-L4 and spondylolisthesis L4-L5. Slip at L4-L5 is a little worse with flexion.   Also with known ACDF C6-C7, cervical spondylosis with slip C4-C5 and DDD C5-C6.   He is here to review his lumbar MRI scan.   He continues with constant LBP with right lateral leg pain to his foot. No left leg pain pain. He has pain with standing and walking. Pain is also worse with prolonged sitting. He has numbness and tingling in his feet- he has known neuropathy.   He is being scheduled for an MRI of his right ankle by podiatry.   GTB injections helped on left but not on right. These were done back in June.   Advised by renal to avoid NSAIDs as much as possible. He is taking flexeril , tylenol , indocin , lyrica . Given dose pack by podiatry on 12/23/23- has not started yet.   He does not smoke.   Bowel/Bladder Dysfunction: occasional bowel urgency, no incontinence. No perineal numbness.   Conservative measures:  Physical therapy: has not participated in PT  Multimodal medical therapy including regular antiinflammatories: Lyrica , Tylenol , Flexeril , valium , indocin , ultram  Injections:  no epidural steroid injections  Past Surgery:  05/26/2021 SCS placement with Dr. Bluford ACDF C6-C7 L4-L5 laminectomy with resection of  recurrent cyst 05/26/18- [facility] L4-L5 hemilaminectomy/discectomy 11/01/17- [facility]  Jonathon Newman has some balance issues that are intermittent. No dexterity issues.  The symptoms are causing a significant impact on the patient's life.   Review of Systems:  A 10 point review of systems is negative, except for the pertinent positives and negatives detailed in the HPI.  Past Medical History: Past Medical History:  Diagnosis Date   Allergy    Anginal pain (HCC)    Arthritis    BPH (benign prostatic hyperplasia)    CHF (congestive heart failure) (HCC)    COPD (chronic obstructive pulmonary disease) (HCC)    Coronary artery disease 2010   a.) LHC 2010: high grade RCA stenosis -> 2.5 x 26mm Cypher DES to RCA. b.) NSTEMI 2016 -> PCI revealed pat RCA stent; sig dLM Dz with FFR ratio 0.76 and oLCx stenosis; ref to CVTS. c.) 2v CABG 04/21/2015; LIMA-LAD, SVG-OM2. d.) [facility] 05/02/2018: EF 55-65%; LVEDP 14-15 mmHg; 10% p-m RCA, 30% pRCA, 20% dRCA, 95% o-pLCx, 80% m-dLM; LIMA graft pat. SVG graft with mild diffuse Dz.   DDD (degenerative disc disease), lumbar    Dyspnea    GERD (gastroesophageal reflux disease)    Hypercholesteremia    Hypertension    Left foot drop    Neuromuscular disorder (HCC)    NSTEMI (non-ST elevated myocardial infarction) (HCC) 2016   a.) PCI revealed patent RCA stent; significant dLM disease with FFR ratio 0.76 and oLCx stenosis; refer to CVTS for  CABG.   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 LIMA to LAD amd SVG to OM2   CORONARY STENT PLACEMENT  2010   Cordis Cypher Sirolimus-eluting stent 2.50 mm x 26 mm placed to the RCA at [facility]    ESOPHAGEAL MANOMETRY N/A 08/04/2017   Procedure: ESOPHAGEAL MANOMETRY (EM);  Surgeon: Unk Corinn Skiff, MD;  Location: [facility] ENDOSCOPY;  Service: Endoscopy;  Laterality: N/A;   ESOPHAGOGASTRODUODENOSCOPY (EGD) WITH PROPOFOL  N/A 06/17/2022   Procedure: ESOPHAGOGASTRODUODENOSCOPY (EGD) WITH PROPOFOL ;  Surgeon: Therisa Bi, MD;  Location: [facility] ENDOSCOPY;  Service: Gastroenterology;  Laterality: N/A;   FRACTURE SURGERY     KNEE SURGERY     KNEE SURGERY     right knee    LAMINECTOMY     plate in neck R6-R5   LEFT HEART CATH AND CORS/GRAFTS ANGIOGRAPHY N/A 05/02/2018   Procedure: CORS/GRAFTS ANGIOGRAPHY;  Surgeon: Darron Deatrice LABOR, MD;  Location: [facility] INVASIVE CV LAB;  Service: Cardiovascular;  Laterality: N/A;   RIGHT/LEFT HEART CATH AND CORONARY ANGIOGRAPHY Bilateral 05/02/2018   Procedure: LEFT HEART CATH;  Surgeon: Darron Deatrice LABOR, MD;  Location: [facility] INVASIVE CV LAB;  Service: Cardiovascular;  Laterality: Bilateral;   SPINE SURGERY     THORACIC LAMINECTOMY FOR SPINAL CORD STIMULATOR N/A 05/26/2021   Procedure: THORACIC SPINAL CORD STIMULATOR AND PULSE GENERATOR PLACEMENT (MEDTRONIC);  Surgeon: Bluford Standing, MD;  Location: [facility] ORS;  Service: Neurosurgery;  Laterality: N/A;   VASECTOMY      Allergies: Allergies as of 12/28/2023 - Review Complete 12/28/2023  Allergen Reaction Noted   Ace inhibitors Other (See Comments) 08/03/2012   Colchicine  Other (See Comments) 07/17/2019   Lisinopril Cough 12/02/2017    Medications: Outpatient Encounter Medications as of 12/28/2023  Medication Sig   acetaminophen  (TYLENOL ) 500 MG tablet Take 1,000 mg by mouth every 8 (eight) hours as  needed for mild pain or moderate pain. Takes once a day   aspirin  EC 81 MG tablet Take 81 mg by mouth daily. Swallow whole.   atorvastatin  (LIPITOR) 80 MG tablet Take 1 tablet (80 mg total) by mouth daily.   bisacodyl  (DULCOLAX) 5 MG EC tablet Take 5 mg by mouth daily as needed.   Blood Glucose Monitoring Suppl (FREESTYLE LITE) DEVI To check blood sugar once daily   carvedilol  (COREG ) 12.5 MG tablet Take 1 tablet (12.5 mg total) by mouth 2 (two) times daily with a meal.   Cholecalciferol (VITAMIN D3) 50 MCG (2000 UT) capsule Take 2,000 Units by mouth daily.   clobetasol  (TEMOVATE ) 0.05 % external solution Apply 1 Application topically 2 (two) times daily.   Continuous Glucose Sensor (FREESTYLE LIBRE 3 PLUS SENSOR) MISC Change sensor every 15 days.   cyanocobalamin  (VITAMIN B12) 1000 MCG  tablet Take 1,000 mcg by mouth daily.   cyclobenzaprine  (FLEXERIL ) 10 MG tablet Take 1 tablet (10 mg total) by mouth 3 (three) times daily as needed for muscle spasms.   diazepam  (VALIUM ) 2 MG tablet Take 2 mg by mouth every 8 (eight) hours as needed.   ezetimibe  (ZETIA ) 10 MG tablet TAKE 1 TABLET DAILY   FREESTYLE LITE test strip USE TO CHECK BLOOD SUGAR ONCE DAILY   indomethacin  (INDOCIN ) 50 MG capsule Take 1 capsule (50 mg total) by mouth daily as needed (gout flare). Taking once daily   isosorbide  mononitrate (IMDUR ) 60 MG 24 hr tablet TAKE 1 TABLET DAILY   JARDIANCE  10 MG TABS tablet TAKE 1 TABLET DAILY BEFORE BREAKFAST   ketoconazole  (NIZORAL ) 2 % shampoo Apply topically 2 (two) times a week.   Lancets (FREESTYLE) lancets USE TO CHECK BLOOD SUGAR ONCE DAILY   metFORMIN  (GLUCOPHAGE -XR) 500 MG 24 hr tablet TAKE 2 TABLETS TWICE A DAY WITH MEALS (NEED FOLLOW UP APPOINTMENT FOR UPDATED LABS AND FURTHER REFILLS)   methylPREDNISolone  (MEDROL  DOSEPAK) 4 MG TBPK tablet Take as directed   Multiple Vitamin (MULTIVITAMIN WITH MINERALS) TABS tablet Take 1 tablet by mouth daily.   nitroGLYCERIN  (NITROSTAT ) 0.4 MG SL  tablet DISSOLVE 1 TABLET UNDER THE TONGUE EVERY 5 MINUTES AS NEEDED FOR CHEST PAIN   OZEMPIC , 0.25 OR 0.5 MG/DOSE, 2 MG/3ML SOPN INJECT 0.5 MG UNDER THE SKIN ONCE A WEEK   pantoprazole  (PROTONIX ) 40 MG tablet TAKE 1 TABLET DAILY   potassium chloride  (KLOR-CON ) 10 MEQ tablet TAKE 2 TABLETS TWICE A DAY   pregabalin  (LYRICA ) 100 MG capsule Take 100 mg by mouth 3 (three) times daily.   Pyrithione Zinc (DERMAZINC SHAMPOO) 2 % SHAM Apply 1 Dose topically as needed.   sacubitril-valsartan (ENTRESTO ) 49-51 MG Take 1 tablet by mouth 2 (two) times daily.   torsemide  (DEMADEX ) 20 MG tablet Take 2 tablets (40 mg total) by mouth every other day.   No facility-administered encounter medications on file as of 12/28/2023.    Social History: Social History   Tobacco Use   Smoking status: Former    Current packs/day: 0.00    Average packs/day: 0.3 packs/day for 2.0 years (0.5 ttl pk-yrs)    Types: Cigarettes, Pipe, Cigars    Start date: 05/27/2003    Quit date: 05/26/2005    Years since quitting: 18.6   Smokeless tobacco: Former    Types: Associate Professor status: Never Used  Substance Use Topics   Alcohol use: No   Drug use: No    Family Medical History: Family History  Problem Relation Age of Onset   Arthritis Mother    Hyperlipidemia Father    Hypertension Father    Heart attack Father 52   Heart disease Father    Heart murmur Brother    Diabetes Brother    Valvular heart disease Brother    Obesity Brother    Cataracts Maternal Grandmother    Glaucoma Maternal Grandmother    Cancer Maternal Grandfather    Heart attack Paternal Grandfather    Hypertension Paternal Grandfather    Prostate cancer Neg Hx    Bladder Cancer Neg Hx    Kidney cancer Neg Hx     Physical Examination: Vitals:   12/28/23 0825  BP: 134/84    Awake, alert, oriented to person, place, and time.  Speech is clear and fluent. Fund of knowledge is appropriate.   Cranial Nerves: Pupils equal round  and reactive  to light.  Facial tone is symmetric.    Mild lower right sided lumbar tenderness. Well healed lumbar incisions.   No abnormal lesions on exposed skin.   Strength:  Side Iliopsoas Quads Hamstring PF DF EHL  R 5 5 5 5 5 5   L 5 5 5 5 3 3    Reflexes are 2+ and symmetric at the biceps, brachioradialis, patella.     Bilateral lower extremity sensation is intact to light touch.     He has mild pain with IR/ER of both hips. He has tenderness over right greater trochanteric bursa.   He ambulates with a cane.    Medical Decision Making  Imaging: Lumbar MRI dated 12/24/23:  FINDINGS:   BONES AND ALIGNMENT: Normal alignment. Normal vertebral body heights. Type 2 Modic changes are present at L4-5. Type 3 Modic changes are present on the right at L5-S1. Laminectomy noted at L5-S1.   SPINAL CORD: The conus medullaris terminates at L1-2.   SOFT TISSUES: No paraspinal mass. Artifact from dorsal spinal cord stimulator wires is present at L1-2.   L1-L2: No focal disc protrusion or stenosis is present. Artifact from dorsal spinal cord stimulator wires is present.   L2-L3: No significant disc herniation. No spinal canal stenosis or neural foraminal narrowing.   L3-L4: Mild disc desiccation is present. Mild facet hypertrophy is present. No focal stenosis or change is present.   L4-L5: A broad-based disc protrusion is present. Moderate facet hypertrophy has progressed bilaterally. Moderate right and mild left subarticular stenosis has progressed. Moderate foraminal stenosis has progressed bilaterally.   L5-S1: Leftward disc protrusion is present. Mild left foraminal narrowing is stable. Progressive moderate left foraminal stenosis is present. New mild right foraminal stenosis is present.   IMPRESSION: 1. Progressed moderate right and mild left subarticular stenosis and moderate bilateral foraminal stenosis at L4-5. 2. Progressive moderate left foraminal stenosis  and new mild right foraminal stenosis at L5-S1.   Electronically signed by: Lonni Necessary MD 12/27/2023 04:26 AM EDT RP Workstation: [id number]   I have personally reviewed the images and agree with the above interpretation.  Assessment and Plan: He is s/p SCS by Dr. Bluford 05/26/21. He has known left foot drop. Also with history of ACDF.   He did great after SCS with minimal pain. History of lumbar surgery x 2 with [facility] (see above).   He continues with constant LBP with right lateral leg pain to his foot. No left leg pain pain. He has pain with standing and walking. Pain is also worse with prolonged sitting. He has numbness and tingling in his feet- he has known neuropathy.   He has known retrolisthesis L3-L4 and spondylolisthesis L4-L5. Slip at L4-L5 is a little worse with flexion. Also with disc at L4-L5 with moderate right/mild left subarticular stenosis and moderate bilateral foraminal stenosis along with left sided disc L5-S1 with moderate left/mild right foraminal stenosis.   LBP and right leg pain may be from L4-L5. Still with tenderness over right GTB and this may be contributing to his pain. Right ankle pain is likely ankle mediated.   Treatment options discussed with patient and following plan made:   - Referral to pain management ([facility]) to consider lumbar injections.  - PT for lumbar spine recommended. He declines.  - Follow up with podiatry for his right ankle. MRI is pending.  - Had bilateral GTB injections with Dr. Sharrie on 11/22/23- consider follow up with him after next visit if still having tenderness on right.  -  Follow up with me in 8 weeks and prn (after his trip to the [place]).   I spent a total of 30 minutes in face-to-face and non-face-to-face activities related to this patient's care today including review of outside records, review of imaging, review of symptoms, physical exam, discussion of differential diagnosis, discussion of treatment options, and  documentation.   Glade Boys PA-C Dept. of Neurosurgery

## 2023-12-28 ENCOUNTER — Encounter: Payer: Self-pay | Admitting: Orthopedic Surgery

## 2023-12-28 ENCOUNTER — Ambulatory Visit: Admitting: Orthopedic Surgery

## 2023-12-28 VITALS — BP 134/84 | Ht 67.0 in | Wt 214.0 lb

## 2023-12-28 DIAGNOSIS — M48061 Spinal stenosis, lumbar region without neurogenic claudication: Secondary | ICD-10-CM | POA: Diagnosis not present

## 2023-12-28 DIAGNOSIS — M9973 Connective tissue and disc stenosis of intervertebral foramina of lumbar region: Secondary | ICD-10-CM

## 2023-12-28 DIAGNOSIS — M21372 Foot drop, left foot: Secondary | ICD-10-CM | POA: Diagnosis not present

## 2023-12-28 DIAGNOSIS — Z9682 Presence of neurostimulator: Secondary | ICD-10-CM

## 2023-12-28 DIAGNOSIS — M47816 Spondylosis without myelopathy or radiculopathy, lumbar region: Secondary | ICD-10-CM

## 2023-12-28 DIAGNOSIS — G629 Polyneuropathy, unspecified: Secondary | ICD-10-CM

## 2023-12-28 DIAGNOSIS — Z9689 Presence of other specified functional implants: Secondary | ICD-10-CM

## 2023-12-28 DIAGNOSIS — M5416 Radiculopathy, lumbar region: Secondary | ICD-10-CM

## 2023-12-28 DIAGNOSIS — M4316 Spondylolisthesis, lumbar region: Secondary | ICD-10-CM

## 2023-12-28 DIAGNOSIS — M7061 Trochanteric bursitis, right hip: Secondary | ICD-10-CM

## 2023-12-28 NOTE — Patient Instructions (Signed)
 It was so nice to see you today. Thank you so much for coming in.    You have some wear and tear in your back. I think your back and right leg pain may be coming from L4-L5.   I think some of the pain in right thigh is from hip bursitis.   The pain in your right ankle is likely from the ankle.   I want you to see pain management here in [place] (Dr. Marcelino) to discuss possible lumbar injections. They should call you to schedule an appointment or you can call them at [phone].   Follow up with podiatry as scheduled.   I will see you back in 2 months. Please do not hesitate to call if you have any questions or concerns. You can also message me in MyChart.   Have a great trip in September!  Jonathon Boys PA-C [phone]     The physicians and staff at [facility] at [facility] are committed to providing excellent care. You may receive a survey asking for feedback about your experience at our office. We value you your feedback and appreciate you taking the time to to fill it out. The [facility] leadership team is also available to discuss your experience in person, feel free to contact us  [phone].

## 2024-01-06 ENCOUNTER — Ambulatory Visit: Admitting: Urology

## 2024-01-06 ENCOUNTER — Encounter: Payer: Self-pay | Admitting: Urology

## 2024-01-06 VITALS — BP 122/68 | HR 76 | Ht 67.0 in | Wt 207.0 lb

## 2024-01-06 DIAGNOSIS — R972 Elevated prostate specific antigen [PSA]: Secondary | ICD-10-CM | POA: Diagnosis not present

## 2024-01-06 DIAGNOSIS — R3914 Feeling of incomplete bladder emptying: Secondary | ICD-10-CM | POA: Diagnosis not present

## 2024-01-06 DIAGNOSIS — N401 Enlarged prostate with lower urinary tract symptoms: Secondary | ICD-10-CM | POA: Diagnosis not present

## 2024-01-06 DIAGNOSIS — R339 Retention of urine, unspecified: Secondary | ICD-10-CM | POA: Diagnosis not present

## 2024-01-06 DIAGNOSIS — N5201 Erectile dysfunction due to arterial insufficiency: Secondary | ICD-10-CM

## 2024-01-06 LAB — BLADDER SCAN AMB NON-IMAGING

## 2024-01-06 MED ORDER — TAMSULOSIN HCL 0.4 MG PO CAPS: 0.4000 mg | ORAL_CAPSULE | Freq: Every day | ORAL | 2 refills | Status: DC

## 2024-01-06 NOTE — Patient Instructions (Addendum)
 [email or web address]  Please contact Central Scheduling to set up your prostate MRI at [phone].  Prostate MRI Prep:  1- No ejaculation 48 hours prior to exam  2- No caffeine or carbonated beverages on day of the exam  3- Eat light diet evening prior and day of exam  4- Avoid eating 4 hours prior to exam  5- Fleets enema needs to be done 4 hours prior to exam -See below. Can be purchased at the drug store.

## 2024-01-06 NOTE — Progress Notes (Signed)
 I, Jonathon Newman, acting as a scribe for Jonathon JAYSON Barba, MD., have documented all relevant documentation on the behalf of Jonathon JAYSON Barba, MD, as directed by Jonathon JAYSON Barba, MD while in the presence of Jonathon JAYSON Barba, MD.  01/06/2024 5:55 PM   Jonathon Newman [birth year] [id number]  Referring provider: Wellington Curtis LABOR, FNP [address] [place],  [place] [address]  Chief Complaint  Patient presents with   Elevated PSA    HPI: Jonathon Newman is a 68 y.o. male referred for evaluation of an elevated PSA.   PSA drawn 12/01/2023 was elevated at 6.4. Prior PSA result 09/27/2020 was 5.4 and 5.3 06/2020. He states he was not aware of PSA elevation I saw him in 2019 for BPH with lower urinary tract symptoms and he was started on tamsulosin . Cystoscopy remarkable for lateral lobe enlargement. PVR at that time was 350 mL.  States he subsequently underwent a UroLift at [facility] in [place], approximately 3-4 years ago.  States his voiding symptoms never improved after UroLift and he has a sensation of incomplete emptying, frequency, weak urinary stream, and urinary hesitancy with straining to urinate. IPSS today 24/35. Urinalysis May 2025 was unremarkable.  Also complains of ED. He has been on sildenafil in the past, which was not effective. He states he has no erectile activity.   PSA trend   Prostate Specific Ag, Serum  Latest Ref Rng 0.0 - 4.0 ng/mL  06/28/2020 5.3 (H)   09/27/2020 5.4 (H)   12/01/2023 6.4 (H)      PMH: Past Medical History:  Diagnosis Date   Allergy    Anginal pain (HCC)    Arthritis    BPH (benign prostatic hyperplasia)    CHF (congestive heart failure) (HCC)    COPD (chronic obstructive pulmonary disease) (HCC)    Coronary artery disease 2010   a.) LHC 2010: high grade RCA stenosis -> 2.5 x 26mm Cypher DES to RCA. b.) NSTEMI 2016 -> PCI revealed pat RCA stent; sig dLM Dz with FFR ratio 0.76 and oLCx stenosis; ref to CVTS. c.) 2v CABG  04/21/2015; LIMA-LAD, SVG-OM2. d.) [facility] 05/02/2018: EF 55-65%; LVEDP 14-15 mmHg; 10% p-m RCA, 30% pRCA, 20% dRCA, 95% o-pLCx, 80% m-dLM; LIMA graft pat. SVG graft with mild diffuse Dz.   DDD (degenerative disc disease), lumbar    Dyspnea    GERD (gastroesophageal reflux disease)    Hypercholesteremia    Hypertension    Left foot drop    Neuromuscular disorder (HCC)    NSTEMI (non-ST elevated myocardial infarction) (HCC) 2016   a.) PCI revealed patent RCA stent; significant dLM disease with FFR ratio 0.76 and oLCx stenosis; refer to CVTS for CABG.   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   a.) 2v CABG; LIMA-LAD, SVG-OM2   Sleep apnea    T2DM (type 2 diabetes mellitus) (HCC)     Surgical History: Past Surgical History:  Procedure Laterality Date   APPENDECTOMY     BACK SURGERY  11/01/2017   SL 5 and S1   CARDIAC CATHETERIZATION     CARPAL TUNNEL RELEASE     left hand   CHOLECYSTECTOMY     CHOLECYSTECTOMY     COLONOSCOPY WITH PROPOFOL  N/A 10/28/2022   Procedure: COLONOSCOPY WITH PROPOFOL ;  Surgeon: Therisa Bi, MD;  Location: [facility] ENDOSCOPY;  Service: Gastroenterology;  Laterality: N/A;   CORONARY ARTERY BYPASS GRAFT  04/21/2015   CABG  x 2 [employer]. LIMA to LAD amd SVG to OM2   CORONARY STENT PLACEMENT  2010   Cordis Cypher Sirolimus-eluting stent 2.50 mm x 26 mm placed to the RCA at [facility]    ESOPHAGEAL MANOMETRY N/A 08/04/2017   Procedure: ESOPHAGEAL MANOMETRY (EM);  Surgeon: Unk Corinn Skiff, MD;  Location: [facility] ENDOSCOPY;  Service: Endoscopy;  Laterality: N/A;   ESOPHAGOGASTRODUODENOSCOPY (EGD) WITH PROPOFOL  N/A 06/17/2022   Procedure: ESOPHAGOGASTRODUODENOSCOPY (EGD) WITH PROPOFOL ;  Surgeon: Therisa Bi, MD;  Location: [facility] ENDOSCOPY;  Service: Gastroenterology;  Laterality: N/A;   FRACTURE SURGERY     KNEE SURGERY     KNEE SURGERY     right knee    LAMINECTOMY     plate in neck R6-R5   LEFT HEART CATH AND  CORS/GRAFTS ANGIOGRAPHY N/A 05/02/2018   Procedure: CORS/GRAFTS ANGIOGRAPHY;  Surgeon: Darron Deatrice LABOR, MD;  Location: [facility] INVASIVE CV LAB;  Service: Cardiovascular;  Laterality: N/A;   RIGHT/LEFT HEART CATH AND CORONARY ANGIOGRAPHY Bilateral 05/02/2018   Procedure: LEFT HEART CATH;  Surgeon: Darron Deatrice LABOR, MD;  Location: [facility] INVASIVE CV LAB;  Service: Cardiovascular;  Laterality: Bilateral;   SPINE SURGERY     THORACIC LAMINECTOMY FOR SPINAL CORD STIMULATOR N/A 05/26/2021   Procedure: THORACIC SPINAL CORD STIMULATOR AND PULSE GENERATOR PLACEMENT (MEDTRONIC);  Surgeon: Bluford Standing, MD;  Location: [facility] ORS;  Service: Neurosurgery;  Laterality: N/A;   VASECTOMY      Home Medications:  Allergies as of 01/06/2024       Reactions   Ace Inhibitors Other (See Comments)   Colchicine  Other (See Comments)   Palpitations and headache   Lisinopril Cough        Medication List        Accurate as of January 06, 2024  5:55 PM. If you have any questions, ask your nurse or doctor.          STOP taking these medications    FreeStyle Libre 3 Plus Sensor Misc Stopped by: Jonathon Newman   methylPREDNISolone  4 MG Tbpk tablet Commonly known as: MEDROL  DOSEPAK Stopped by: Jonathon Newman       TAKE these medications    acetaminophen  500 MG tablet Commonly known as: TYLENOL  Take 1,000 mg by mouth every 8 (eight) hours as needed for mild pain or moderate pain. Takes once a day   aspirin  EC 81 MG tablet Take 81 mg by mouth daily. Swallow whole.   atorvastatin  80 MG tablet Commonly known as: LIPITOR Take 1 tablet (80 mg total) by mouth daily.   bisacodyl  5 MG EC tablet Commonly known as: DULCOLAX Take 5 mg by mouth daily as needed.   carvedilol  12.5 MG tablet Commonly known as: COREG  Take 1 tablet (12.5 mg total) by mouth 2 (two) times daily with a meal.   clobetasol  0.05 % external solution Commonly known as: TEMOVATE  Apply 1 Application topically 2 (two) times daily.    cyanocobalamin  1000 MCG tablet Commonly known as: VITAMIN B12 Take 1,000 mcg by mouth daily.   cyclobenzaprine  10 MG tablet Commonly known as: FLEXERIL  Take 1 tablet (10 mg total) by mouth 3 (three) times daily as needed for muscle spasms.   DermaZinc Shampoo 2 % Sham Generic drug: Pyrithione Zinc Apply 1 Dose topically as needed.   diazepam  2 MG tablet Commonly known as: VALIUM  Take 2 mg by mouth every 8 (eight) hours as needed.   Entresto  49-51 MG Generic drug: sacubitril-valsartan Take 1 tablet by mouth 2 (  two) times daily.   ezetimibe  10 MG tablet Commonly known as: ZETIA  TAKE 1 TABLET DAILY   freestyle lancets USE TO CHECK BLOOD SUGAR ONCE DAILY   FreeStyle Lite Devi To check blood sugar once daily   FREESTYLE LITE test strip Generic drug: glucose blood USE TO CHECK BLOOD SUGAR ONCE DAILY   indomethacin  50 MG capsule Commonly known as: INDOCIN  Take 1 capsule (50 mg total) by mouth daily as needed (gout flare). Taking once daily   isosorbide  mononitrate 60 MG 24 hr tablet Commonly known as: IMDUR  TAKE 1 TABLET DAILY   Jardiance  10 MG Tabs tablet Generic drug: empagliflozin  TAKE 1 TABLET DAILY BEFORE BREAKFAST   ketoconazole  2 % shampoo Commonly known as: NIZORAL  Apply topically 2 (two) times a week.   metFORMIN  500 MG 24 hr tablet Commonly known as: GLUCOPHAGE -XR TAKE 2 TABLETS TWICE A DAY WITH MEALS (NEED FOLLOW UP APPOINTMENT FOR UPDATED LABS AND FURTHER REFILLS)   multivitamin with minerals Tabs tablet Take 1 tablet by mouth daily.   nitroGLYCERIN  0.4 MG SL tablet Commonly known as: NITROSTAT  DISSOLVE 1 TABLET UNDER THE TONGUE EVERY 5 MINUTES AS NEEDED FOR CHEST PAIN   Ozempic  (0.25 or 0.5 MG/DOSE) 2 MG/3ML Sopn Generic drug: Semaglutide (0.25 or 0.5MG /DOS) INJECT 0.5 MG UNDER THE SKIN ONCE A WEEK   pantoprazole  40 MG tablet Commonly known as: PROTONIX  TAKE 1 TABLET DAILY   potassium chloride  10 MEQ tablet Commonly known as: KLOR-CON  TAKE  2 TABLETS TWICE A DAY   pregabalin  100 MG capsule Commonly known as: LYRICA  Take 100 mg by mouth 3 (three) times daily.   tamsulosin  0.4 MG Caps capsule Commonly known as: FLOMAX  Take 1 capsule (0.4 mg total) by mouth daily. Started by: Jonathon Newman   torsemide  20 MG tablet Commonly known as: DEMADEX  Take 2 tablets (40 mg total) by mouth every other day.   Vitamin D3 50 MCG (2000 UT) capsule Take 2,000 Units by mouth daily.        Allergies:  Allergies  Allergen Reactions   Ace Inhibitors Other (See Comments)   Colchicine  Other (See Comments)    Palpitations and headache   Lisinopril Cough    Family History: Family History  Problem Relation Age of Onset   Arthritis Mother    Hyperlipidemia Father    Hypertension Father    Heart attack Father 57   Heart disease Father    Heart murmur Brother    Diabetes Brother    Valvular heart disease Brother    Obesity Brother    Cataracts Maternal Grandmother    Glaucoma Maternal Grandmother    Cancer Maternal Grandfather    Heart attack Paternal Grandfather    Hypertension Paternal Grandfather    Prostate cancer Neg Hx    Bladder Cancer Neg Hx    Kidney cancer Neg Hx     Social History:  reports that he quit smoking about 18 years ago. His smoking use included cigarettes, pipe, and cigars. He started smoking about 20 years ago. He has a 0.5 pack-year smoking history. He has quit using smokeless tobacco.  His smokeless tobacco use included chew. He reports that he does not drink alcohol and does not use drugs.   Physical Exam: BP 122/68   Pulse 76   Ht 5' 7 (1.702 m)   Wt 207 lb (93.9 kg)   BMI 32.42 kg/m   Constitutional:  Alert and oriented, No acute distress. HEENT: [place] AT Respiratory: Normal respiratory effort, no increased work of  breathing. GU: Prostate 45 grams smooth without nodules.  Psychiatric: Normal mood and affect.   Assessment & Plan:    1. Elevated PSA Although PSA is a prostate cancer  screening test he was informed that cancer is not the most common cause of an elevated PSA. Other potential causes including BPH and inflammation were discussed.  He was informed that the only way to adequately diagnose prostate cancer would be transrectal ultrasound and biopsy of the prostate. The procedure was discussed including potential risks of bleeding and infection/sepsis. He was also informed that a negative biopsy does not conclusively rule out the possibility that prostate cancer may be present and that continued monitoring is required.  The use of newer adjunctive blood and urine tests to predict the probability of high-grade prostate cancer were discussed. The use of multiparametric prostate MRI to evaluate for lesions suspicious for high grade prostate cancer and aid in targeted bx was reviewed.  Continued periodic surveillance was also discussed. He is elected further evaluation with prostate MRI. He does have a spinal cord stimulator, which is MRI compatible.   2. BPH with LUTS Severe lower urinary tract symptoms,  PVR today 255 mL Will restart tamsulosin  0.4 mg daily.   3. Erectile dysfunction No partial erections and PDE5 inhibitor was not effective.  We discussed intracavernosal injections and he was provided the literature.  I have reviewed the above documentation for accuracy and completeness, and I agree with the above.   Jonathon JAYSON Barba, MD  [facility] [address] [place], [place] [address] [phone]

## 2024-01-10 ENCOUNTER — Encounter: Payer: Self-pay | Admitting: Urology

## 2024-01-13 ENCOUNTER — Ambulatory Visit
Admission: RE | Admit: 2024-01-13 | Discharge: 2024-01-13 | Disposition: A | Discharge status: Home / Self Care | Source: Ambulatory Visit | Attending: Urology | Admitting: Urology

## 2024-01-13 ENCOUNTER — Ambulatory Visit: Payer: Self-pay | Admitting: Family

## 2024-01-13 DIAGNOSIS — R972 Elevated prostate specific antigen [PSA]: Secondary | ICD-10-CM | POA: Diagnosis present

## 2024-01-13 MED ORDER — GADOBUTROL 1 MMOL/ML IV SOLN
9.0000 mL | Freq: Once | INTRAVENOUS | Status: AC | PRN
  Administered 2024-01-13: 9 mL via INTRAVENOUS

## 2024-01-20 DIAGNOSIS — G4733 Obstructive sleep apnea (adult) (pediatric): Secondary | ICD-10-CM | POA: Diagnosis not present

## 2024-01-23 ENCOUNTER — Ambulatory Visit: Payer: Self-pay | Admitting: Urology

## 2024-01-24 ENCOUNTER — Other Ambulatory Visit
Admission: RE | Admit: 2024-01-24 | Discharge: 2024-01-24 | Disposition: A | Discharge status: Home / Self Care | Source: Ambulatory Visit | Attending: Podiatry | Admitting: Podiatry

## 2024-01-24 ENCOUNTER — Ambulatory Visit: Payer: Self-pay | Admitting: Family

## 2024-01-24 ENCOUNTER — Other Ambulatory Visit: Payer: Self-pay | Admitting: Family

## 2024-01-24 ENCOUNTER — Ambulatory Visit
Admission: RE | Admit: 2024-01-24 | Discharge: 2024-01-24 | Disposition: A | Discharge status: Home / Self Care | Source: Ambulatory Visit | Attending: Podiatry | Admitting: Podiatry

## 2024-01-24 DIAGNOSIS — M609 Myositis, unspecified: Secondary | ICD-10-CM | POA: Diagnosis not present

## 2024-01-24 DIAGNOSIS — E785 Hyperlipidemia, unspecified: Secondary | ICD-10-CM

## 2024-01-24 DIAGNOSIS — M7671 Peroneal tendinitis, right leg: Secondary | ICD-10-CM | POA: Diagnosis not present

## 2024-01-24 DIAGNOSIS — I5032 Chronic diastolic (congestive) heart failure: Secondary | ICD-10-CM

## 2024-01-24 LAB — BASIC METABOLIC PANEL WITH GFR
Anion gap: 10 (ref 5–15)
BUN: 19 mg/dL (ref 8–23)
CO2: 24 mmol/L (ref 22–32)
Calcium: 9.4 mg/dL (ref 8.9–10.3)
Chloride: 109 mmol/L (ref 98–111)
Creatinine, Ser: 1.24 mg/dL (ref 0.61–1.24)
GFR, Estimated: >60 mL/min (ref >60)
Glucose, Bld: 131 mg/dL — ABNORMAL HIGH (ref 70–99)
Potassium: 4.1 mmol/L (ref 3.5–5.1)
Sodium: 143 mmol/L (ref 135–145)

## 2024-01-24 LAB — LIPID PANEL
Cholesterol: 129 mg/dL (ref 0–200)
HDL: 32 mg/dL — ABNORMAL LOW (ref >40)
LDL Cholesterol: 30 mg/dL (ref 0–99)
Total CHOL/HDL Ratio: 4 ratio
Triglycerides: 337 mg/dL — ABNORMAL HIGH (ref <150)
VLDL: 67 mg/dL — ABNORMAL HIGH (ref 0–40)

## 2024-01-24 MED ORDER — GEMFIBROZIL 600 MG PO TABS: 600.0000 mg | ORAL_TABLET | Freq: Two times a day (BID) | ORAL | 3 refills | Status: AC

## 2024-01-24 NOTE — Addendum Note (Signed)
 Addended by: ELAINE RICARD SAILOR on: 01/24/2024 08:00 AM   Modules accepted: Orders

## 2024-01-27 ENCOUNTER — Encounter: Payer: Self-pay | Admitting: Student in an Organized Health Care Education/Training Program

## 2024-01-27 ENCOUNTER — Ambulatory Visit
Attending: Student in an Organized Health Care Education/Training Program | Admitting: Student in an Organized Health Care Education/Training Program

## 2024-01-27 VITALS — BP 137/83 | HR 75 | Temp 97.2°F | Resp 20 | Ht 67.0 in | Wt 210.0 lb

## 2024-01-27 DIAGNOSIS — G894 Chronic pain syndrome: Secondary | ICD-10-CM | POA: Diagnosis not present

## 2024-01-27 DIAGNOSIS — E1140 Type 2 diabetes mellitus with diabetic neuropathy, unspecified: Secondary | ICD-10-CM | POA: Insufficient documentation

## 2024-01-27 DIAGNOSIS — Z9689 Presence of other specified functional implants: Secondary | ICD-10-CM | POA: Insufficient documentation

## 2024-01-27 DIAGNOSIS — G8929 Other chronic pain: Secondary | ICD-10-CM | POA: Diagnosis not present

## 2024-01-27 DIAGNOSIS — M4726 Other spondylosis with radiculopathy, lumbar region: Secondary | ICD-10-CM

## 2024-01-27 DIAGNOSIS — M47816 Spondylosis without myelopathy or radiculopathy, lumbar region: Secondary | ICD-10-CM | POA: Insufficient documentation

## 2024-01-27 DIAGNOSIS — M5416 Radiculopathy, lumbar region: Secondary | ICD-10-CM | POA: Diagnosis not present

## 2024-01-27 NOTE — Progress Notes (Signed)
 Safety precautions to be maintained throughout the outpatient stay will include: orient to surroundings, keep bed in low position, maintain call bell within reach at all times, provide assistance with transfer out of bed and ambulation.

## 2024-01-27 NOTE — Patient Instructions (Signed)
 ______________________________________________________________________    Preparing for your procedure  Appointments: If you think you may not be able to keep your appointment, call 24-48 hours in advance to cancel. We need time to make it available to others.  Procedure visits are for procedures only. During your procedure appointment there will be: NO Prescription Refills*. NO medication changes or discussions*. NO discussion of disability issues*. NO unrelated pain problem evaluations*. NO evaluations to order other pain procedures*. *These will be addressed at a separate and distinct evaluation encounter on the provider's evaluation schedule and not during procedure days.  Instructions: Food intake: Avoid eating anything solid for at least 8 hours prior to your procedure. Clear liquid intake: You may take clear liquids such as water  up to 2 hours prior to your procedure. (No carbonated drinks. No soda.) Transportation: Unless otherwise stated by your physician, bring a driver. (Driver cannot be a Market researcher, Pharmacist, community, or any other form of public transportation.) Morning Medicines: Except for blood thinners, take all of your other morning medications with a sip of water . Make sure to take your heart and blood pressure medicines. If your blood pressure's lower number is above 100, the case will be rescheduled. Blood thinners: Make sure to stop your blood thinners as instructed.  If you take a blood thinner, but were not instructed to stop it, call our office [phone] and ask to talk to a nurse. Not stopping a blood thinner prior to certain procedures could lead to serious complications. Diabetics on insulin: Notify the staff so that you can be scheduled 1st case in the morning. If your diabetes requires high dose insulin, take only  of your normal insulin dose the morning of the procedure and notify the staff that you have done so. Preventing infections: Shower with an antibacterial soap the  morning of your procedure.  Build-up your immune system: Take 1000 mg of Vitamin C with every meal (3 times a day) the day prior to your procedure. Antibiotics: Inform the nursing staff if you are taking any antibiotics or if you have any conditions that may require antibiotics prior to procedures. (Example: recent joint implants)   Pregnancy: If you are pregnant make sure to notify the nursing staff. Not doing so may result in injury to the fetus, including death.  Sickness: If you have a cold, fever, or any active infections, call and cancel or reschedule your procedure. Receiving steroids while having an infection may result in complications. Arrival: You must be in the facility at least 30 minutes prior to your scheduled procedure. Tardiness: Your scheduled time is also the cutoff time. If you do not arrive at least 15 minutes prior to your procedure, you will be rescheduled.  Children: Do not bring any children with you. Make arrangements to keep them home. Dress appropriately: There is always a possibility that your clothing may get soiled. Avoid long dresses. Valuables: Do not bring any jewelry or valuables.  Reasons to call and reschedule or cancel your procedure: (Following these recommendations will minimize the risk of a serious complication.) Surgeries: Avoid having procedures within 2 weeks of any surgery. (Avoid for 2 weeks before or after any surgery). Flu Shots: Avoid having procedures within 2 weeks of a flu shots or . (Avoid for 2 weeks before or after immunizations). Barium: Avoid having a procedure within 7-10 days after having had a radiological study involving the use of radiological contrast. (Myelograms, Barium swallow or enema study). Heart attacks: Avoid any elective procedures or surgeries for the  initial 6 months after a Myocardial Infarction (Heart Attack). Blood thinners: It is imperative that you stop these medications before procedures. Let us  know if you if you take  any blood thinner.  Infection: Avoid procedures during or within two weeks of an infection (including chest colds or gastrointestinal problems). Symptoms associated with infections include: Localized redness, fever, chills, night sweats or profuse sweating, burning sensation when voiding, cough, congestion, stuffiness, runny nose, sore throat, diarrhea, nausea, vomiting, cold or Flu symptoms, recent or current infections. It is specially important if the infection is over the area that we intend to treat. Heart and lung problems: Symptoms that may suggest an active cardiopulmonary problem include: cough, chest pain, breathing difficulties or shortness of breath, dizziness, ankle swelling, uncontrolled high or unusually low blood pressure, and/or palpitations. If you are experiencing any of these symptoms, cancel your procedure and contact your primary care physician for an evaluation.  Remember:  Regular Business hours are:  Monday to Thursday 8:00 AM to 4:00 PM  Provider's Schedule: Eric Como, MD:  Procedure days: Tuesday and Thursday 7:30 AM to 4:00 PM  Wallie Sherry, MD:  Procedure days: Monday and Wednesday 7:30 AM to 4:00 PM Last  Updated: 05/25/2023 ______________________________________________________________________      ______________________________________________________________________    General Risks and Possible Complications  Patient Responsibilities: It is important that you read this as it is part of your informed consent. It is our duty to inform you of the risks and possible complications associated with treatments offered to you. It is your responsibility as a patient to read this and to ask questions about anything that is not clear or that you believe was not covered in this document.  Patient's Rights: You have the right to refuse treatment. You also have the right to change your mind, even after initially having agreed to have the treatment done. However,  under this last option, if you wait until the last second to change your mind, you may be charged for the materials used up to that point.  Introduction: Medicine is not an Visual merchandiser. Everything in Medicine, including the lack of treatment(s), carries the potential for danger, harm, or loss (which is by definition: Risk). In Medicine, a complication is a secondary problem, condition, or disease that can aggravate an already existing one. All treatments carry the risk of possible complications. The fact that a side effects or complications occurs, does not imply that the treatment was conducted incorrectly. It must be clearly understood that these can happen even when everything is done following the highest safety standards.  No treatment: You can choose not to proceed with the proposed treatment alternative. The "PRO(s)" would include: avoiding the risk of complications associated with the therapy. The "CON(s)" would include: not getting any of the treatment benefits. These benefits fall under one of three categories: diagnostic; therapeutic; and/or palliative. Diagnostic benefits include: getting information which can ultimately lead to improvement of the disease or symptom(s). Therapeutic benefits are those associated with the successful treatment of the disease. Finally, palliative benefits are those related to the decrease of the primary symptoms, without necessarily curing the condition (example: decreasing the pain from a flare-up of a chronic condition, such as incurable terminal cancer).  General Risks and Complications: These are associated to most interventional treatments. They can occur alone, or in combination. They fall under one of the following six (6) categories: no benefit or worsening of symptoms; bleeding; infection; nerve damage; allergic reactions; and/or death. No benefits or worsening  of symptoms: In Medicine there are no guarantees, only probabilities. No healthcare provider can  ever guarantee that a medical treatment will work, they can only state the probability that it may. Furthermore, there is always the possibility that the condition may worsen, either directly, or indirectly, as a consequence of the treatment. Bleeding: This is more common if the patient is taking a blood thinner, either prescription or over the counter (example: Goody Powders, Fish oil, Aspirin , Garlic, etc.), or if suffering a condition associated with impaired coagulation (example: Hemophilia, cirrhosis of the liver, low platelet counts, etc.). However, even if you do not have one on these, it can still happen. If you have any of these conditions, or take one of these drugs, make sure to notify your treating physician. Infection: This is more common in patients with a compromised immune system, either due to disease (example: diabetes, cancer, human immunodeficiency virus [HIV], etc.), or due to medications or treatments (example: therapies used to treat cancer and rheumatological diseases). However, even if you do not have one on these, it can still happen. If you have any of these conditions, or take one of these drugs, make sure to notify your treating physician. Nerve Damage: This is more common when the treatment is an invasive one, but it can also happen with the use of medications, such as those used in the treatment of cancer. The damage can occur to small secondary nerves, or to large primary ones, such as those in the spinal cord and brain. This damage may be temporary or permanent and it may lead to impairments that can range from temporary numbness to permanent paralysis and/or brain death. Allergic Reactions: Any time a substance or material comes in contact with our body, there is the possibility of an allergic reaction. These can range from a mild skin rash (contact dermatitis) to a severe systemic reaction (anaphylactic reaction), which can result in death. Death: In general, any medical  intervention can result in death, most of the time due to an unforeseen complication. ______________________________________________________________________     Facet Blocks Patient Information  Description: The facets are joints in the spine between the vertebrae.  Like any joints in the body, facets can become irritated and painful.  Arthritis can also effect the facets.  By injecting steroids and local anesthetic in and around these joints, we can temporarily block the nerve supply to them.  Steroids act directly on irritated nerves and tissues to reduce selling and inflammation which often leads to decreased pain.  Facet blocks may be done anywhere along the spine from the neck to the low back depending upon the location of your pain.   After numbing the skin with local anesthetic (like Novocaine), a small needle is passed onto the facet joints under x-ray guidance.  You may experience a sensation of pressure while this is being done.  The entire block usually lasts about 15-25 minutes.   Conditions which may be treated by facet blocks:  Low back/buttock pain Neck/shoulder pain Certain types of headaches  Preparation for the injection:  Do not eat any solid food or dairy products within 8 hours of your appointment. You may drink clear liquid up to 3 hours before appointment.  Clear liquids include water , black coffee, juice or soda.  No milk or cream please. You may take your regular medication, including pain medications, with a sip of water  before your appointment.  Diabetics should hold regular insulin (if taken separately) and take 1/2 normal NPH dose  the morning of the procedure.  Carry some sugar containing items with you to your appointment. A driver must accompany you and be prepared to drive you home after your procedure. Bring all your current medications with you. An IV may be inserted and sedation may be given at the discretion of the physician. A blood pressure cuff, EKG and  other monitors will often be applied during the procedure.  Some patients may need to have extra oxygen administered for a short period. You will be asked to provide medical information, including your allergies and medications, prior to the procedure.  We must know immediately if you are taking blood thinners (like Coumadin/Warfarin) or if you are allergic to IV iodine contrast (dye).  We must know if you could possible be pregnant.  Possible side-effects:  Bleeding from needle site Infection (rare, may require surgery) Nerve injury (rare) Numbness & tingling (temporary) Difficulty urinating (rare, temporary) Spinal headache (a headache worse with upright posture) Light-headedness (temporary) Pain at injection site (serveral days) Decreased blood pressure (rare, temporary) Weakness in arm/leg (temporary) Pressure sensation in back/neck (temporary)   Call if you experience:  Fever/chills associated with headache or increased back/neck pain Headache worsened by an upright position New onset, weakness or numbness of an extremity below the injection site Hives or difficulty breathing (go to the emergency room) Inflammation or drainage at the injection site(s) Severe back/neck pain greater than usual New symptoms which are concerning to you  Please note:  Although the local anesthetic injected can often make your back or neck feel good for several hours after the injection, the pain will likely return. It takes 3-7 days for steroids to work.  You may not notice any pain relief for at least one week.  If effective, we will often do a series of 2-3 injections spaced 3-6 weeks apart to maximally decrease your pain.  After the initial series, you may be a candidate for a more permanent nerve block of the facets.  If you have any questions, please call [phone] [facility]

## 2024-01-27 NOTE — Progress Notes (Signed)
 PROVIDER NOTE: Interpretation of information contained herein should be left to medically-trained personnel. Specific patient instructions are provided elsewhere under Patient Instructions section of medical record. This document was created in part using AI and STT-dictation technology, any transcriptional errors that may result from this process are unintentional.  Patient: [name]  Service: E/M Encounter  Provider: Wallie Sherry, MD  DOB: 11/26/1955  Delivery: Face-to-face  Specialty: Interventional Pain Management  MRN: [id number]  Setting: Ambulatory outpatient facility  Specialty designation: 09  Type: New Patient  Location: Outpatient office facility  PCP: Wellington Curtis LABOR, FNP  DOS: 01/27/2024    Referring Prov.: Hilma Hastings, PA-C   Primary Reason(s) for Visit: Encounter for initial evaluation of one or more chronic problems (new to examiner) potentially causing chronic pain, and posing a threat to normal musculoskeletal function. (Level of risk: High) CC: Back Pain (Lower Back radiating to bilateral hips )  HPI  Mr. Dobbs is a 68 y.o. year old, male patient, who comes for the first time to our practice referred by Hilma Hastings, PA-C for our initial evaluation of his chronic pain. He has OSA (obstructive sleep apnea); Gastroesophageal reflux disease with esophagitis; Congestive heart failure (HCC); Benign prostatic hyperplasia with weak urinary stream; Dysphagia; Lumbar spondylosis; Low back pain; Chronic radicular lumbar pain; S/P coronary artery bypass graft x 2; Unstable angina (HCC); Benign paroxysmal positional vertigo due to bilateral vestibular disorder; Abnormal findings on diagnostic imaging of lung; T2DM (type 2 diabetes mellitus) (HCC); Hyperlipidemia associated with type 2 diabetes mellitus (HCC); Essential hypertension; Type 2 diabetes mellitus with diabetic peripheral angiopathy without gangrene, without long-term current use of insulin (HCC); Foot drop, left; Morton's  neuroma of both feet; DDD (degenerative disc disease), lumbosacral; Psoriasis of scalp; Left hip pain; Chronic diastolic CHF (congestive heart failure) (HCC); CAD (coronary artery disease); Vitamin D  deficiency; Strain of foot; Pain in joint of left knee; Chronic pharyngitis; Pain in joint, forearm; Pain in right knee; Numbness and tingling in left hand; Hypertension associated with diabetes (HCC); Combined arterial insufficiency and corporo-venous occlusive erectile dysfunction; Chronic left hip pain; Chronic right hip pain; Chronic painful diabetic neuropathy (HCC); Spinal cord stimulator status; Lumbar facet arthropathy; and Chronic pain syndrome on their problem list. Today he comes in for evaluation of his Back Pain (Lower Back radiating to bilateral hips )  Pain Assessment: Location: Lower, Left, Right Back Radiating: Radiates to bilateral hips Onset: More than a month ago Duration: Chronic pain Quality: Constant, Aching Severity: 7 /10 (subjective, self-reported pain score)  Effect on ADL: Limits ADL's Timing: Constant Modifying factors: Medications BP: 137/83  HR: 75  Onset and Duration: Present longer than 3 months Cause of pain: Unknown Severity: Getting worse Timing: Not influenced by the time of the day Aggravating Factors: Climbing, Kneeling, Lifiting, Prolonged sitting, Prolonged standing, and Walking Alleviating Factors: Lying down, Resting, Sleeping, and Warm showers or baths Associated Problems: Dizziness, Erectile dysfunction, Numbness, and Tingling Quality of Pain: Aching and Burning Previous Examinations or Tests: CT scan, MRI scan, Nerve block, Nerve conduction test, Neurological evaluation, Neurosurgical evaluation, and Orthopedic evaluation Previous Treatments: Physical Therapy, Spinal cord stimulator, Stretching exercises, and Trigger point injections  Mr. Moritz is being evaluated for possible interventional pain management therapies for the treatment of his  chronic pain.  Discussed the use of AI scribe software for clinical note transcription with the patient, who gave verbal consent to proceed.  History of Present Illness   Oneil Behney is a 68 year old male with a history of  spinal surgeries and diabetes who presents with hip and leg pain. Referred by the neurosurgery team.  He experiences pain in both hips, diagnosed as bursitis following x-rays. Injections in both hips provided relief, with the left side lasting about three months and the right side only three days.  He has a history of two back surgeries in 2019, including a laminectomy and cyst removal, and has a spinal cord stimulator implanted.  Of note, the patient had a spinal cord stimulator trial with me on 04/16/2021 and went on to have a permanent implant with [employer] with Dr. Bluford.  He states that the stimulator was very helpful for the first 2 years and then became less beneficial.  The stimulator provides intermittent relief, but he has had issues with the controller, which was recently adjusted and now requires charging every other day.  He has difficulty walking due to pain and balance issues, reporting eight falls in the past year, two resulting in injury.   He experiences burning and tingling in his feet, which began six to eight weeks ago. He is seeing a foot doctor for a suspected tendon issue in his ankle. An MRI indicated a tear, and he received an injection in the tendon four to five months ago without relief.  He takes diazepam  as needed and uses a cane for stability due to balance issues.   He does have diabetic neuropathic pain of bilateral feet    Meds   Current Outpatient Medications:    acetaminophen  (TYLENOL ) 500 MG tablet, Take 1,000 mg by mouth every 8 (eight) hours as needed for mild pain or moderate pain. Takes once a day, Disp: , Rfl:    aspirin  EC 81 MG tablet, Take 81 mg by mouth daily. Swallow whole., Disp: , Rfl:    atorvastatin  (LIPITOR) 80 MG  tablet, Take 1 tablet (80 mg total) by mouth daily., Disp: , Rfl:    bisacodyl  (DULCOLAX) 5 MG EC tablet, Take 5 mg by mouth daily as needed., Disp: , Rfl:    Blood Glucose Monitoring Suppl (FREESTYLE LITE) DEVI, To check blood sugar once daily, Disp: 1 each, Rfl: 0   carvedilol  (COREG ) 12.5 MG tablet, Take 1 tablet (12.5 mg total) by mouth 2 (two) times daily with a meal., Disp: 180 tablet, Rfl: 3   Cholecalciferol (VITAMIN D3) 50 MCG (2000 UT) capsule, Take 2,000 Units by mouth daily., Disp: , Rfl:    clobetasol  (TEMOVATE ) 0.05 % external solution, Apply 1 Application topically 2 (two) times daily., Disp: 50 mL, Rfl: 11   cyanocobalamin  (VITAMIN B12) 1000 MCG tablet, Take 1,000 mcg by mouth daily., Disp: , Rfl:    cyclobenzaprine  (FLEXERIL ) 10 MG tablet, Take 1 tablet (10 mg total) by mouth 3 (three) times daily as needed for muscle spasms., Disp: 90 tablet, Rfl: 1   diazepam  (VALIUM ) 2 MG tablet, Take 2 mg by mouth every 8 (eight) hours as needed., Disp: , Rfl:    ezetimibe  (ZETIA ) 10 MG tablet, TAKE 1 TABLET DAILY, Disp: 90 tablet, Rfl: 3   FREESTYLE LITE test strip, USE TO CHECK BLOOD SUGAR ONCE DAILY, Disp: 100 strip, Rfl: 3   gemfibrozil  (LOPID ) 600 MG tablet, Take 1 tablet (600 mg total) by mouth 2 (two) times daily before a meal., Disp: 180 tablet, Rfl: 3   indomethacin  (INDOCIN ) 50 MG capsule, Take 1 capsule (50 mg total) by mouth daily as needed (gout flare). Taking once daily, Disp: 90 capsule, Rfl: 1   isosorbide  mononitrate (IMDUR ) 60 MG  24 hr tablet, TAKE 1 TABLET DAILY, Disp: 90 tablet, Rfl: 3   JARDIANCE  10 MG TABS tablet, TAKE 1 TABLET DAILY BEFORE BREAKFAST, Disp: 30 tablet, Rfl: 11   ketoconazole  (NIZORAL ) 2 % shampoo, Apply topically 2 (two) times a week., Disp: 120 mL, Rfl: 3   Lancets (FREESTYLE) lancets, USE TO CHECK BLOOD SUGAR ONCE DAILY, Disp: 100 each, Rfl: 4   metFORMIN  (GLUCOPHAGE -XR) 500 MG 24 hr tablet, TAKE 2 TABLETS TWICE A DAY WITH MEALS (NEED FOLLOW UP APPOINTMENT  FOR UPDATED LABS AND FURTHER REFILLS), Disp: 120 tablet, Rfl: 11   Multiple Vitamin (MULTIVITAMIN WITH MINERALS) TABS tablet, Take 1 tablet by mouth daily., Disp: , Rfl:    nitroGLYCERIN  (NITROSTAT ) 0.4 MG SL tablet, DISSOLVE 1 TABLET UNDER THE TONGUE EVERY 5 MINUTES AS NEEDED FOR CHEST PAIN, Disp: 25 tablet, Rfl: 5   OZEMPIC , 0.25 OR 0.5 MG/DOSE, 2 MG/3ML SOPN, INJECT 0.5 MG UNDER THE SKIN ONCE A WEEK, Disp: 9 mL, Rfl: 3   pantoprazole  (PROTONIX ) 40 MG tablet, TAKE 1 TABLET DAILY, Disp: 90 tablet, Rfl: 3   potassium chloride  (KLOR-CON ) 10 MEQ tablet, TAKE 2 TABLETS TWICE A DAY, Disp: 360 tablet, Rfl: 3   pregabalin  (LYRICA ) 100 MG capsule, Take 100 mg by mouth 3 (three) times daily., Disp: , Rfl:    Pyrithione Zinc (DERMAZINC SHAMPOO) 2 % SHAM, Apply 1 Dose topically as needed., Disp: 480 mL, Rfl: 0   sacubitril-valsartan (ENTRESTO ) 49-51 MG, Take 1 tablet by mouth 2 (two) times daily., Disp: 180 tablet, Rfl: 3   tamsulosin  (FLOMAX ) 0.4 MG CAPS capsule, Take 1 capsule (0.4 mg total) by mouth daily., Disp: 30 capsule, Rfl: 2   torsemide  (DEMADEX ) 20 MG tablet, Take 2 tablets (40 mg total) by mouth every other day., Disp: 90 tablet, Rfl: 3  Imaging Review  Cervical Imaging: Cervical MR wo contrast: Results for orders placed during the hospital encounter of 01/05/21  MR CERVICAL SPINE WO CONTRAST  Narrative CLINICAL DATA:  S/P cervical spinal fusion Z98.1 (ICD-10-CM). Ataxia R27.0 (ICD-10-CM)Imbalance R26.89 (ICD-10-CM). Left foot drop M21.372 (ICD-10-CM). Additional history provided by scanning technologist: Patient reports ataxia and left foot drop for 4 months.  EXAM: MRI CERVICAL SPINE WITHOUT CONTRAST  TECHNIQUE: Multiplanar, multisequence MR imaging of the cervical spine was performed. No intravenous contrast was administered.  COMPARISON:  Brain MRI 04/03/2020.  FINDINGS: Alignment: Straightening of the expected cervical lordosis. Trace C4-C5 grade 1 anterolisthesis. Trace  C7-T1 and T1-T2 grade 1 anterolisthesis.  Vertebrae: Susceptibility artifact arising from ACDF hardware at the C6-C7 level. No appreciable significant marrow edema or focal suspicious osseous lesion.  Cord: No spinal cord signal abnormality is identified.  Posterior Fossa, vertebral arteries, paraspinal tissues: No abnormality identified within included portions of the posterior fossa. Flow voids preserved within the imaged cervical vertebral arteries. Paraspinal soft tissues within normal limits  Disc levels:  Mild multilevel disc degeneration, greatest at C5-C6 and C7-T1.  C2-C3: No significant disc herniation or stenosis.  C3-C4: Shallow disc bulge. Left greater than right uncovertebral hypertrophy. Minimal facet arthrosis. Minimal partial effacement of the ventral thecal sac without spinal cord mass effect. Bilateral neural foraminal narrowing (mild right, moderate left).  C4-C5: Trace grade 1 anterolisthesis. Slight disc uncovering and disc bulge. Mild facet arthrosis. Small facet joint effusion on the right. No significant spinal canal stenosis. Bilateral neural foraminal narrowing (mild right, moderate left).  C5-C6: Disc bulge with bilateral disc osteophyte ridge/uncinate hypertrophy. Minimal facet arthrosis. Mild spinal canal stenosis without spinal cord mass effect.  Bilateral neural foraminal narrowing (moderate right, moderate/severe left).  C6-C7: Prior ACDF. Solid fusion across the disc space. No significant spinal canal or foraminal stenosis.  C7-T1: Trace grade 1 anterolisthesis. Slight disc uncovering. Mild to moderate facet arthrosis/ligamentum flavum hypertrophy. No significant spinal canal stenosis. Mild bilateral neural foraminal narrowing (greater on the left).  IMPRESSION: At C6-C7, there has been prior ACDF. There is fusion across the disc space. No significant spinal canal or foraminal stenosis.  Cervical spondylosis, as outlined. No more than  mild spinal canal stenosis. Multilevel neural foraminal narrowing, greatest on the left at C3-C4 (moderate), on the left at C4-C5 (moderate) and bilaterally at C5-C6 (moderate right, moderate/severe left).  Also of note, there is a small right-sided facet joint effusion at C4-C5.   Electronically Signed By: Rockey Childs DO On: 01/06/2021 08:15   DG Cervical Spine Complete  Addendum 11/15/2023  8:47 PM ADDENDUM REPORT: 11/15/2023 20:45  ADDENDUM: Flexion and extension views were added to the cervical and lumbar spine radiographs. Addendum requested.  Cervical spine flexion and extension views:  Minimal anterolisthesis of C4 on C5 slightly improves with extension, but does not significantly change with flexion.  Lumbar spine flexion and extension views:  Minimal retrolisthesis of L3 on L4 does not significantly change with flexion or extension.  Minimal anterolisthesis of L4 on L5 slightly worsens with flexion and slightly improves with extension.   Electronically Signed By: Aliene Lloyd M.D. On: 11/15/2023 20:45  Narrative CLINICAL DATA:  Difficulty walking.  Leg pain.  Frequent falls.  EXAM: CERVICAL SPINE - COMPLETE 4+ VIEW; LUMBAR SPINE - COMPLETE 4+ VIEW; THORACIC SPINE 2 VIEWS  COMPARISON:  MRI cervical spine 01/05/2021. Lumbar spine radiographs 03/29/2023. Thoracic spine radiographs 05/26/2021.  FINDINGS: Cervical spine:  Minimal anterolisthesis of C4 on C5. ACDF changes seen at C6-C7. Mild disc height loss and endplate spurring at C5-C6. Facet uncovertebral degenerative changes seen throughout the cervical spine. Severe RIGHT neural foraminal stenosis at C3-C4, C5-C6, C6-C7. moderate LEFT neural foraminal stenosis seen at C3-C4, C4-C5, C5-C6.  Thoracic spine:  Alignment is within normal limits. Minimal anterior wedging of the T12 vertebral body is unchanged since 03/29/2023. Minimal disc height loss and endplate spurring seen throughout the mid and  lower thoracic levels. Neurostimulator leads again seen terminating at the level of T7-T8. Postsurgical changes of CABG again seen. Multiple fractured median sternotomy wires are present.  Lumbar spine:  Minimal retrolisthesis of L3 on L4 and minimal anterolisthesis of L4 on L5. Mild disc height loss and endplate degenerative changes seen throughout the lumbar spine, greatest at L5-S1. Moderate facet degenerative changes seen throughout the lumbar spine, greatest at L4-L5. 8 mm calcific density in the RIGHT upper quadrant favored to be bowel content rather than nephrolithiasis.  IMPRESSION: 1. Mild-to-moderate multilevel degenerative changes of the cervical spine, greatest at C5-C6. 2. Minimal multilevel degenerative changes of the thoracic spine. 3. Mild to moderate multilevel degenerative changes of the lumbar spine, greatest at L5-S1.  Electronically Signed: By: Aliene Lloyd M.D. On: 11/04/2023 09:06   MR THORACIC SPINE WO CONTRAST  Narrative CLINICAL DATA:  Ataxia, left foot drop  EXAM: MRI THORACIC SPINE WITHOUT CONTRAST  TECHNIQUE: Multiplanar, multisequence MR imaging of the thoracic spine was performed. No intravenous contrast was administered.  COMPARISON:  None.  FINDINGS: Alignment:  Preserved.  Vertebrae: Vertebral body heights are maintained. There is a vertebral body hemangioma at T6. No marrow edema. No suspicious osseous lesion. Partially imaged lower cervical anterior fusion.  Cord:  No  abnormal signal.  Paraspinal and other soft tissues: Unremarkable.  Disc levels: Intervertebral disc heights are maintained. There is a small central disc protrusion at T5-T6 indenting the ventral thecal sac. No significant canal or foraminal stenosis at any level.  IMPRESSION: No abnormal cord signal. Small central disc protrusion at T5-T6. No significant canal or foraminal stenosis at any level.   Electronically Signed By: Santina Blanch M.D. On:  02/03/2021 08:35   Addendum 11/15/2023  8:47 PM ADDENDUM REPORT: 11/15/2023 20:45  ADDENDUM: Flexion and extension views were added to the cervical and lumbar spine radiographs. Addendum requested.  Cervical spine flexion and extension views:  Minimal anterolisthesis of C4 on C5 slightly improves with extension, but does not significantly change with flexion.  Lumbar spine flexion and extension views:  Minimal retrolisthesis of L3 on L4 does not significantly change with flexion or extension.  Minimal anterolisthesis of L4 on L5 slightly worsens with flexion and slightly improves with extension.   Electronically Signed By: Aliene Lloyd M.D. On: 11/15/2023 20:45  Narrative CLINICAL DATA:  Difficulty walking.  Leg pain.  Frequent falls.  EXAM: CERVICAL SPINE - COMPLETE 4+ VIEW; LUMBAR SPINE - COMPLETE 4+ VIEW; THORACIC SPINE 2 VIEWS  COMPARISON:  MRI cervical spine 01/05/2021. Lumbar spine radiographs 03/29/2023. Thoracic spine radiographs 05/26/2021.  FINDINGS: Cervical spine:  Minimal anterolisthesis of C4 on C5. ACDF changes seen at C6-C7. Mild disc height loss and endplate spurring at C5-C6. Facet uncovertebral degenerative changes seen throughout the cervical spine. Severe RIGHT neural foraminal stenosis at C3-C4, C5-C6, C6-C7. moderate LEFT neural foraminal stenosis seen at C3-C4, C4-C5, C5-C6.  Thoracic spine:  Alignment is within normal limits. Minimal anterior wedging of the T12 vertebral body is unchanged since 03/29/2023. Minimal disc height loss and endplate spurring seen throughout the mid and lower thoracic levels. Neurostimulator leads again seen terminating at the level of T7-T8. Postsurgical changes of CABG again seen. Multiple fractured median sternotomy wires are present.  Lumbar spine:  Minimal retrolisthesis of L3 on L4 and minimal anterolisthesis of L4 on L5. Mild disc height loss and endplate degenerative changes seen throughout the  lumbar spine, greatest at L5-S1. Moderate facet degenerative changes seen throughout the lumbar spine, greatest at L4-L5. 8 mm calcific density in the RIGHT upper quadrant favored to be bowel content rather than nephrolithiasis.  IMPRESSION: 1. Mild-to-moderate multilevel degenerative changes of the cervical spine, greatest at C5-C6. 2. Minimal multilevel degenerative changes of the thoracic spine. 3. Mild to moderate multilevel degenerative changes of the lumbar spine, greatest at L5-S1.  Electronically Signed: By: Aliene Lloyd M.D. On: 11/04/2023 09:06  Lumbosacral Imaging: Lumbar MR wo contrast: Results for orders placed during the hospital encounter of 12/24/23  MR LUMBAR SPINE WO CONTRAST  Narrative EXAM: MRI LUMBAR SPINE 12/24/2023 03:15:06 PM  TECHNIQUE: Multiplanar multisequence MRI of the lumbar spine was performed without the administration of intravenous contrast.  COMPARISON: MRI of the lumbar spine 02/13/2021.  CLINICAL HISTORY: Lumbar radiculopathy, symptoms persist with > 6 wks treatment, Lumbar spondylosis, S/P insertion of spinal cord stimulator.  FINDINGS:  BONES AND ALIGNMENT: Normal alignment. Normal vertebral body heights. Type 2 Modic changes are present at L4-5. Type 3 Modic changes are present on the right at L5-S1. Laminectomy noted at L5-S1.  SPINAL CORD: The conus medullaris terminates at L1-2.  SOFT TISSUES: No paraspinal mass. Artifact from dorsal spinal cord stimulator wires is present at L1-2.  L1-L2: No focal disc protrusion or stenosis is present. Artifact from dorsal spinal cord stimulator  wires is present.  L2-L3: No significant disc herniation. No spinal canal stenosis or neural foraminal narrowing.  L3-L4: Mild disc desiccation is present. Mild facet hypertrophy is present. No focal stenosis or change is present.  L4-L5: A broad-based disc protrusion is present. Moderate facet hypertrophy has progressed bilaterally.  Moderate right and mild left subarticular stenosis has progressed. Moderate foraminal stenosis has progressed bilaterally.  L5-S1: Leftward disc protrusion is present. Mild left foraminal narrowing is stable. Progressive moderate left foraminal stenosis is present. New mild right foraminal stenosis is present.  IMPRESSION: 1. Progressed moderate right and mild left subarticular stenosis and moderate bilateral foraminal stenosis at L4-5. 2. Progressive moderate left foraminal stenosis and new mild right foraminal stenosis at L5-S1.  Electronically signed by: Lonni Necessary MD 12/27/2023 04:26 AM EDT RP Workstation: [id number]   DG Lumbar Spine Complete  Addendum 11/15/2023  8:47 PM ADDENDUM REPORT: 11/15/2023 20:45  ADDENDUM: Flexion and extension views were added to the cervical and lumbar spine radiographs. Addendum requested.  Cervical spine flexion and extension views:  Minimal anterolisthesis of C4 on C5 slightly improves with extension, but does not significantly change with flexion.  Lumbar spine flexion and extension views:  Minimal retrolisthesis of L3 on L4 does not significantly change with flexion or extension.  Minimal anterolisthesis of L4 on L5 slightly worsens with flexion and slightly improves with extension.   Electronically Signed By: Aliene Lloyd M.D. On: 11/15/2023 20:45  Narrative CLINICAL DATA:  Difficulty walking.  Leg pain.  Frequent falls.  EXAM: CERVICAL SPINE - COMPLETE 4+ VIEW; LUMBAR SPINE - COMPLETE 4+ VIEW; THORACIC SPINE 2 VIEWS  COMPARISON:  MRI cervical spine 01/05/2021. Lumbar spine radiographs 03/29/2023. Thoracic spine radiographs 05/26/2021.  FINDINGS: Cervical spine:  Minimal anterolisthesis of C4 on C5. ACDF changes seen at C6-C7. Mild disc height loss and endplate spurring at C5-C6. Facet uncovertebral degenerative changes seen throughout the cervical spine. Severe RIGHT neural foraminal stenosis at C3-C4,  C5-C6, C6-C7. moderate LEFT neural foraminal stenosis seen at C3-C4, C4-C5, C5-C6.  Thoracic spine:  Alignment is within normal limits. Minimal anterior wedging of the T12 vertebral body is unchanged since 03/29/2023. Minimal disc height loss and endplate spurring seen throughout the mid and lower thoracic levels. Neurostimulator leads again seen terminating at the level of T7-T8. Postsurgical changes of CABG again seen. Multiple fractured median sternotomy wires are present.  Lumbar spine:  Minimal retrolisthesis of L3 on L4 and minimal anterolisthesis of L4 on L5. Mild disc height loss and endplate degenerative changes seen throughout the lumbar spine, greatest at L5-S1. Moderate facet degenerative changes seen throughout the lumbar spine, greatest at L4-L5. 8 mm calcific density in the RIGHT upper quadrant favored to be bowel content rather than nephrolithiasis.  IMPRESSION: 1. Mild-to-moderate multilevel degenerative changes of the cervical spine, greatest at C5-C6. 2. Minimal multilevel degenerative changes of the thoracic spine. 3. Mild to moderate multilevel degenerative changes of the lumbar spine, greatest at L5-S1.  Electronically Signed: By: Aliene Lloyd M.D. On: 11/04/2023 09:06  DG Hip Unilat W OR W/O Pelvis 2-3 Views Right  Narrative CLINICAL DATA:  Right hip pain.  Chronic.  EXAM: DG HIP (WITH OR WITHOUT PELVIS) 2-3V RIGHT  COMPARISON:  Pelvis and right hip radiographs 06/06/2020  FINDINGS: The bilateral sacroiliac joint spaces are maintained. Likely brachytherapy seeds are again seen within the region of the prostate. A surgical clip again overlies the proximal medial right thigh. New battery pack and partially visualized leads of likely spinal cord stimulator extending  over the lower lumbar spine and off superior plane of view.  Mild bilateral superior femoroacetabular joint space narrowing, similar to prior. The bilateral sacroiliac and pubic  symphysis joint spaces are maintained.  No acute fracture or dislocation.  IMPRESSION: Very mild bilateral femoroacetabular osteoarthritis, similar to prior.   Electronically Signed By: Tanda Lyons M.D. On: 07/26/2023 19:06   DG Hip Unilat W OR W/O Pelvis 2-3 Views Left  Narrative CLINICAL DATA:  Bilateral hip pain  EXAM: DG HIP (WITH OR WITHOUT PELVIS) 2-3V RIGHT; DG HIP (WITH OR WITHOUT PELVIS) 2-3V LEFT  COMPARISON:  None.  FINDINGS: There is no evidence of hip fracture or dislocation. Mild bilateral hip joint space narrowing. No significant SI joint arthropathy. Postsurgical changes within the low pelvis. Surgical clips within the right groin region and medial right thigh.  IMPRESSION: Mild bilateral hip joint space narrowing.   Electronically Signed By: Mabel Converse D.O. On: 06/06/2020 13:53  CT KNEE LEFT WO CONTRAST  Narrative CLINICAL DATA:  [place].  Knee pain.  EXAM: CT OF THE LEFT KNEE WITHOUT CONTRAST  TECHNIQUE: Multidetector CT imaging of the left knee was performed according to the standard protocol. Multiplanar CT image reconstructions were also generated.  RADIATION DOSE REDUCTION: This exam was performed according to the departmental dose-optimization program which includes automated exposure control, adjustment of the mA and/or kV according to patient size and/or use of iterative reconstruction technique.  COMPARISON:  None Available.  FINDINGS: The joint spaces are maintained. Mild degenerative changes. No acute bony abnormality. No osteochondral lesion. Cortical thickening along the medial posterior aspect of the medial epicondylar region most likely from a healed cortical desmoid.  Grossly by CT the cruciate and collateral ligaments are intact. The quadriceps and patellar tendons are intact. No joint effusion.  IMPRESSION: 1. Mild degenerative changes but no acute bony abnormality. 2. Grossly by CT the cruciate and  collateral ligaments are intact. 3. No joint effusion.   Electronically Signed By: MYRTIS Stammer M.D. On: 04/21/2022 08:54    Narrative CLINICAL DATA:  Left knee pain and swelling.  EXAM: LEFT KNEE - COMPLETE 4+ VIEW  COMPARISON:  CT left knee 04/21/2022  FINDINGS: Minimal superior patellar and superior medial and lateral tibial spine degenerative spurs, similar to prior CT. Joint spaces are preserved. No joint effusion. Minimal chronic enthesopathic change at the quadriceps insertion on the patella.  IMPRESSION: Minimal patellofemoral and medial compartment osteoarthritis.   Electronically Signed By: Tanda Lyons M.D. On: 07/26/2023 18:59   DG Ankle Complete Right  Narrative CLINICAL DATA:  History of fall several weeks ago with ankle pain, initial encounter  EXAM: RIGHT ANKLE - COMPLETE 3+ VIEW  COMPARISON:  07/03/2021  FINDINGS: Mild degenerative changes of the tibiotalar joint are seen. No acute fracture or dislocation is noted. Soft tissue swelling is noted about the ankle without definitive fracture. Well corticated density is noted adjacent to the medial malleolus consistent with prior trauma.  IMPRESSION: Soft tissue swelling without acute bony abnormality.   Electronically Signed By: Oneil Devonshire M.D. On: 12/20/2021 00:59  Ankle-L DG Complete: No results found for this or any previous visit.   Foot Imaging: Foot-R DG Complete: Results for orders placed during the hospital encounter of 07/26/23  DG Foot Complete Right  Narrative CLINICAL DATA:  Right foot pain and swelling.  EXAM: RIGHT FOOT COMPLETE - 3+ VIEW  COMPARISON:  Right foot radiographs 12/19/2021  FINDINGS: Moderate to severe lateral great toe metatarsophalangeal joint space narrowing with bone-on-bone  contact, subchondral sclerosis, subchondral cystic change, and mild peripheral osteophytosis. Mild peripheral medial osteophytosis. Mild hallux valgus.  Mild posterior  dorsal navicular degenerative spurring. No acute fracture dislocation.  IMPRESSION: Moderate great toe metatarsophalangeal joint osteoarthritis.   Electronically Signed By: Tanda Lyons M.D. On: 07/26/2023 19:04  Complexity Note: Imaging results reviewed.                         ROS  Cardiovascular: Heart trouble, Daily Aspirin  intake, High blood pressure, Chest pain, Heart surgery, Heart failure, Weak heart (CHF), Heart catheterization, and Blood thinners:  Antiplatelet and Anticoagulant Pulmonary or Respiratory: Snoring  and Temporary stoppage of breathing during sleep Neurological: No reported neurological signs or symptoms such as seizures, abnormal skin sensations, urinary and/or fecal incontinence, being born with an abnormal open spine and/or a tethered spinal cord Psychological-Psychiatric: No reported psychological or psychiatric signs or symptoms such as difficulty sleeping, anxiety, depression, delusions or hallucinations (schizophrenial), mood swings (bipolar disorders) or suicidal ideations or attempts Gastrointestinal: Reflux or heatburn Genitourinary: Kidney disease Hematological: No reported hematological signs or symptoms such as prolonged bleeding, low or poor functioning platelets, bruising or bleeding easily, hereditary bleeding problems, low energy levels due to low hemoglobin or being anemic Endocrine: High blood sugar requiring insulin (IDDM) Rheumatologic: No reported rheumatological signs and symptoms such as fatigue, joint pain, tenderness, swelling, redness, heat, stiffness, decreased range of motion, with or without associated rash Musculoskeletal: Negative for myasthenia gravis, muscular dystrophy, multiple sclerosis or malignant hyperthermia Work History: Retired  Allergies  Mr. Repka is allergic to ace inhibitors, colchicine , and lisinopril.  Laboratory Chemistry Profile   Renal Lab Results  Component Value Date   BUN 19 01/24/2024   CREATININE  1.24 01/24/2024   BCR 21 12/01/2023   GFRAA 72 06/28/2020   GFRNONAA >60 01/24/2024   SPECGRAV 1.015 08/17/2017   PHUR 7.0 08/17/2017   PROTEINUR NEGATIVE 05/12/2021     Electrolytes Lab Results  Component Value Date   NA 143 01/24/2024   K 4.1 01/24/2024   CL 109 01/24/2024   CALCIUM  9.4 01/24/2024   MG 1.7 02/10/2022     Hepatic Lab Results  Component Value Date   AST 24 12/01/2023   ALT 46 (H) 12/01/2023   ALBUMIN 4.7 12/01/2023   ALKPHOS 86 12/01/2023   LIPASE 35 08/26/2015     ID Lab Results  Component Value Date   HIV Non Reactive 06/02/2022   SARSCOV2NAA NEGATIVE 03/10/2019   STAPHAUREUS NEGATIVE 05/12/2021   MRSAPCR NEGATIVE 05/12/2021     Bone Lab Results  Component Value Date   VD25OH 34.1 09/30/2022     Endocrine Lab Results  Component Value Date   GLUCOSE 131 (H) 01/24/2024   GLUCOSEU NEGATIVE 05/12/2021   HGBA1C 6.1 (H) 12/01/2023   TSH 2.430 09/30/2022   FREET4 1.05 09/30/2022     Neuropathy Lab Results  Component Value Date   VITAMINB12 595 09/30/2022   FOLATE 17.5 09/30/2022   HGBA1C 6.1 (H) 12/01/2023   HIV Non Reactive 06/02/2022     CNS No results found for: COLORCSF, APPEARCSF, RBCCOUNTCSF, WBCCSF, POLYSCSF, LYMPHSCSF, EOSCSF, PROTEINCSF, GLUCCSF, JCVIRUS, CSFOLI, IGGCSF, LABACHR, ACETBL   Inflammation (CRP: Acute  ESR: Chronic) No results found for: CRP, ESRSEDRATE, LATICACIDVEN   Rheumatology Lab Results  Component Value Date   ANA Negative 06/02/2022   LABURIC 6.8 09/27/2020     Coagulation Lab Results  Component Value Date   INR 1.0 05/12/2021   LABPROT  13.3 05/12/2021   APTT 32 05/12/2021   PLT 186 12/01/2023     Cardiovascular Lab Results  Component Value Date   BNP 73.0 01/06/2022   CKTOTAL 72 06/02/2022   TROPONINI <0.03 08/26/2015   HGB 15.8 12/01/2023   HCT 47.0 12/01/2023     Screening Lab Results  Component Value Date   SARSCOV2NAA NEGATIVE 03/10/2019    STAPHAUREUS NEGATIVE 05/12/2021   MRSAPCR NEGATIVE 05/12/2021   HIV Non Reactive 06/02/2022     Cancer No results found for: CEA, CA125, LABCA2   Allergens No results found for: ALMOND, APPLE, ASPARAGUS, AVOCADO, BANANA, BARLEY, BASIL, BAYLEAF, GREENBEAN, LIMABEAN, WHITEBEAN, BEEFIGE, REDBEET, BLUEBERRY, BROCCOLI, CABBAGE, MELON, CARROT, CASEIN, CASHEWNUT, CAULIFLOWER, CELERY     Note: Lab results reviewed.  PFSH  Drug: Mr. Mcinroy  reports no history of drug use. Alcohol:  reports no history of alcohol use. Tobacco:  reports that he quit smoking about 18 years ago. His smoking use included cigarettes, pipe, and cigars. He started smoking about 20 years ago. He has a 0.5 pack-year smoking history. He has quit using smokeless tobacco.  His smokeless tobacco use included chew. 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 Family: family history includes Arthritis in his mother; Cancer in his maternal grandfather; Cataracts in his maternal grandmother; Diabetes in his brother; Glaucoma in his maternal grandmother; Heart attack in his paternal grandfather; Heart attack (age of onset: 63) in his father; Heart disease in his father; Heart murmur in his brother; Hyperlipidemia in his father; Hypertension in his father and paternal grandfather; Obesity in his brother; Valvular heart disease in his brother.  Past  Surgical History:  Procedure Laterality Date   APPENDECTOMY     BACK SURGERY  11/01/2017   SL 5 and S1   CARDIAC CATHETERIZATION     CARPAL TUNNEL RELEASE     left hand   CHOLECYSTECTOMY     CHOLECYSTECTOMY     COLONOSCOPY WITH PROPOFOL  N/A 10/28/2022   Procedure: COLONOSCOPY WITH PROPOFOL ;  Surgeon: Therisa Bi, MD;  Location: [facility] ENDOSCOPY;  Service: Gastroenterology;  Laterality: N/A;   CORONARY ARTERY BYPASS GRAFT  04/21/2015   CABG x 2 [place] [facility] to LAD amd SVG to OM2   CORONARY STENT PLACEMENT  2010   Cordis Cypher Sirolimus-eluting stent 2.50 mm x 26 mm placed to the RCA at [facility]    ESOPHAGEAL MANOMETRY N/A 08/04/2017   Procedure: ESOPHAGEAL MANOMETRY (EM);  Surgeon: Unk Corinn Skiff, MD;  Location: [facility] ENDOSCOPY;  Service: Endoscopy;  Laterality: N/A;   ESOPHAGOGASTRODUODENOSCOPY (EGD) WITH PROPOFOL  N/A 06/17/2022   Procedure: ESOPHAGOGASTRODUODENOSCOPY (EGD) WITH PROPOFOL ;  Surgeon: Therisa Bi, MD;  Location: [facility] ENDOSCOPY;  Service: Gastroenterology;  Laterality: N/A;   FRACTURE SURGERY     KNEE SURGERY     KNEE SURGERY     right knee    LAMINECTOMY     plate in neck R6-R5   LEFT HEART CATH AND CORS/GRAFTS ANGIOGRAPHY N/A 05/02/2018   Procedure: CORS/GRAFTS ANGIOGRAPHY;  Surgeon: Darron Deatrice LABOR, MD;  Location: [facility] INVASIVE CV LAB;  Service: Cardiovascular;  Laterality: N/A;   neck sx with plate     RIGHT/LEFT HEART CATH AND CORONARY ANGIOGRAPHY Bilateral 05/02/2018   Procedure: LEFT HEART CATH;  Surgeon: Darron Deatrice LABOR, MD;  Location: [facility] INVASIVE CV LAB;  Service: Cardiovascular;  Laterality: Bilateral;   SPINE SURGERY     THORACIC LAMINECTOMY FOR SPINAL CORD STIMULATOR N/A 05/26/2021   Procedure: THORACIC SPINAL CORD STIMULATOR AND PULSE GENERATOR PLACEMENT ([employer]TRONIC);  Surgeon: Bluford Standing, MD;  Location: [facility] ORS;  Service: Neurosurgery;  Laterality: N/A;   VASECTOMY     Active Ambulatory Problems    Diagnosis Date  Noted   OSA (obstructive sleep apnea) 07/31/2015   Gastroesophageal reflux disease with esophagitis 07/05/2017   Congestive heart failure (HCC) 07/05/2017   Benign prostatic hyperplasia with weak urinary stream 07/05/2017   Dysphagia 07/05/2017   Lumbar spondylosis 07/05/2017   Low back pain 07/09/2017   Chronic radicular lumbar pain 07/09/2017   S/P coronary artery bypass graft x 2    Unstable angina (HCC) 04/25/2018   Benign paroxysmal positional vertigo due to bilateral vestibular disorder 03/15/2019   Abnormal findings on diagnostic imaging of lung 03/15/2019   T2DM (type 2 diabetes mellitus) (HCC) 07/17/2019   Hyperlipidemia associated with type 2 diabetes mellitus (HCC) 12/27/2020   Essential hypertension 03/31/2021   Type 2 diabetes mellitus with diabetic peripheral angiopathy without gangrene, without long-term current use of insulin (HCC) 03/31/2021   Foot drop, left 03/31/2021   Morton's neuroma of both feet 07/01/2021   DDD (degenerative disc disease), lumbosacral 07/01/2021   Psoriasis of scalp 07/01/2021   Left hip pain 12/19/2021   Chronic diastolic CHF (congestive heart failure) (HCC) 12/19/2021   CAD (coronary artery disease) 06/01/2022   Vitamin D  deficiency 06/01/2022   Strain of foot 05/09/2019   Pain in joint of left knee 04/20/2022   Chronic pharyngitis 06/01/2022   Pain in joint, forearm 06/01/2022   Pain in right knee 09/10/2018   Numbness and tingling in left hand 09/30/2022   Hypertension associated with diabetes (HCC) 09/30/2022   Combined arterial insufficiency and corporo-venous occlusive erectile dysfunction 12/31/2022   Chronic left hip pain 03/15/2023   Chronic right hip pain 03/15/2023   Chronic painful diabetic neuropathy (HCC) 01/27/2024   Spinal cord stimulator status 01/27/2024   Lumbar facet arthropathy 01/27/2024   Chronic pain syndrome 01/27/2024   Resolved Ambulatory Problems    Diagnosis Date Noted   Coronary artery disease involving  native coronary artery of native heart with angina pectoris (HCC) 07/31/2015   Hypertension associated with type 2 diabetes mellitus (HCC) 07/31/2015   Hyperlipidemia 07/31/2015   Incomplete bladder emptying 07/21/2017   SOB (shortness of breath) 04/13/2018   Vertigo 04/13/2018   Chest pain 03/10/2019   Prediabetes 03/15/2019   Morbid obesity (HCC) 05/20/2020   Elevated transaminase level 10/04/2020   Hyponatremia 02/14/2021   History of recent fall 03/31/2021   BMI 33.0-33.9,adult 03/31/2021   Localized edema 12/19/2021   3+ pitting edema 01/09/2022   AKI (acute kidney injury) (HCC) 01/09/2022   Swelling of limb 01/23/2022   Snoring 06/01/2022   Other specified counseling 06/01/2022   Lateral epicondylitis (tennis elbow) 06/01/2022   History of heartburn 06/01/2022   Contact dermatitis 06/01/2022   Abdominal tenderness, right upper quadrant 06/01/2022   Rash and other nonspecific skin eruption 06/01/2022   Screening for colon cancer 09/30/2022   Welcome to Medicare preventive visit 09/30/2022   23-polyvalent pneumococcal polysaccharide vaccine declined 09/30/2022   Avitaminosis D 09/30/2022   Special  screening for malignant neoplasms, colon 09/30/2022   Benign prostatic hyperplasia with urinary obstruction 10/05/2022   Elevated serum creatinine 12/31/2022   No-show for appointment 12/31/2022   Past Medical History:  Diagnosis Date   Allergy    Anginal pain (HCC)    Arthritis    BPH (benign prostatic hyperplasia)    CHF (congestive heart failure) (HCC)    COPD (chronic obstructive pulmonary disease) (HCC)    Coronary artery disease 2010   DDD (degenerative disc disease), lumbar    Dyspnea    GERD (gastroesophageal reflux disease)    Hypercholesteremia    Hypertension    Left foot drop    Neuromuscular disorder (HCC)    NSTEMI (non-ST elevated myocardial infarction) (HCC) 2016   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   Sleep apnea     Constitutional Exam  General appearance: Well nourished, well developed, and well hydrated. In no apparent acute distress Vitals:   01/27/24 1311  BP: 137/83  Pulse: 75  Resp: 20  Temp: (!) 97.2 F (36.2 C)  TempSrc: Temporal  SpO2: 95%  Weight: 210 lb (95.3 kg)  Height: 5' 7 (1.702 m)   BMI Assessment: Estimated body mass index is 32.89 kg/m as calculated from the following:   Height as of this encounter: 5' 7 (1.702 m).   Weight as of this encounter: 210 lb (95.3 kg).  BMI interpretation table: BMI level Category Range association with higher incidence of chronic pain  <18 kg/m2 Underweight   18.5-24.9 kg/m2 Ideal body weight   25-29.9 kg/m2 Overweight Increased incidence by 20%  30-34.9 kg/m2 Obese (Class I) Increased incidence by 68%  35-39.9 kg/m2 Severe obesity (Class II) Increased incidence by 136%  >40 kg/m2 Extreme obesity (Class III) Increased incidence by 254%   Patient's current BMI Ideal Body weight  Body mass index is 32.89 kg/m. Ideal body weight: 66.1 kg (145 lb 11.6 oz) Adjusted ideal body weight: 77.8 kg (171 lb 6.9 oz)   BMI Readings from Last 4 Encounters:  01/27/24 32.89 kg/m  01/06/24 32.42 kg/m  12/28/23 33.52 kg/m  12/13/23 33.52 kg/m   Wt Readings from Last 4 Encounters:  01/27/24 210 lb (95.3 kg)  01/06/24 207 lb (93.9 kg)  12/28/23 214 lb (97.1 kg)  12/13/23 214 lb (97.1 kg)    Psych/Mental status: Alert, oriented x 3 (person, place, & time)       Eyes: PERLA Respiratory: No evidence of acute respiratory distress  Lumbar Spine Area Exam  Skin & Axial Inspection: Well healed scar from previous spine surgery detected IPG present lower right lumbar region Alignment: Symmetrical Functional ROM: Pain restricted ROM affecting both sides Stability: No instability detected Muscle Tone/Strength: Functionally intact. No obvious neuro-muscular anomalies detected. Sensory (Neurological): Musculoskeletal pain pattern Palpation:  Complains of area being tender to palpation       Provocative Tests: Hyperextension/rotation test: (+) bilaterally for facet joint pain. Lumbar quadrant test (Kemp's test): (+) bilaterally for facet joint pain.  Gait & Posture Assessment  Ambulation: Unassisted Gait: Relatively normal for age and body habitus Posture: WNL  Lower Extremity Exam    Side: Right lower extremity  Side: Left lower extremity  Stability: No instability observed          Stability: No instability observed          Skin & Extremity Inspection: Skin color, temperature, and hair growth are WNL. No peripheral edema or cyanosis. No masses,  redness, swelling, asymmetry, or associated skin lesions. No contractures.  Skin & Extremity Inspection: Skin color, temperature, and hair growth are WNL. No peripheral edema or cyanosis. No masses, redness, swelling, asymmetry, or associated skin lesions. No contractures.  Functional ROM: Unrestricted ROM                  Functional ROM: Unrestricted ROM                  Muscle Tone/Strength: Functionally intact. No obvious neuro-muscular anomalies detected.  Muscle Tone/Strength: Functionally intact. No obvious neuro-muscular anomalies detected.  Sensory (Neurological): Unimpaired        Sensory (Neurological): Unimpaired        DTR: Patellar: deferred today Achilles: deferred today Plantar: deferred today  DTR: Patellar: deferred today Achilles: deferred today Plantar: deferred today  Palpation: No palpable anomalies  Palpation: No palpable anomalies    Assessment  Primary Diagnosis & Pertinent Problem List: The primary encounter diagnosis was Chronic painful diabetic neuropathy (HCC). Diagnoses of Spinal cord stimulator status, Lumbar facet arthropathy, Lumbar spondylosis, Chronic radicular lumbar pain (right>left L4,5), and Chronic pain syndrome were also pertinent to this visit.  Visit Diagnosis (New problems to examiner): 1. Chronic painful diabetic neuropathy (HCC)   2.  Spinal cord stimulator status   3. Lumbar facet arthropathy   4. Lumbar spondylosis   5. Chronic radicular lumbar pain (right>left L4,5)   6. Chronic pain syndrome    Plan of Care (Initial workup plan)    Problem-specific plan: Assessment and Plan    Chronic low back pain due to lumbar facet arthropathy and degenerative disc disease   Chronic low back pain is severe, affecting daily activities, and is attributed to lumbar facet arthropathy and degenerative disc disease. Previous treatments include spinal surgeries and spinal cord stimulator implantation.  Despite that, Aaronjames Kelsay has a history of greater than 3 months of moderate to severe pain which is resulted in functional impairment.  The patient has tried various conservative therapeutic options such as NSAIDs, Tylenol , muscle relaxants, physical therapy which was inadequately effective.  Patient's pain is predominantly axial with physical exam and L-MRI findings suggestive of facet arthropathy. Lumbar facet medial branch nerve blocks were discussed with the patient.  Risks and benefits were reviewed.  Patient would like to proceed with bilateral L3, L4, L5 medial branch nerve block.   Lumbar radiculopathy due to disc herniation and foraminal stenosis, right greater than left   Lumbar radiculopathy is due to disc herniation and foraminal stenosis, with more symptoms on the right side. Pain radiates down the legs, significantly impacting mobility. Consider right L4 and L5 transforaminal epidural steroid injection after diagnostic lumbar facets.   MEDTRONIC spinal cord stimulator implantation for chronic pain   The spinal cord stimulator provides intermittent relief for chronic pain. Discuss potential battery replacement with Medtronic Inceptiv if issues persist, as newer batteries may reduce unexpected discharge/jolts. Monitor stimulator function and consider battery replacement if problems continue.  Diabetic peripheral neuropathy  with burning and tingling in feet   Diabetic peripheral neuropathy presents with severe burning and tingling in the feet, worsening over the past 6-8 weeks, affecting mobility and quality of life. Discussed Qutenza treatment as a potential option, involving an 8% capsaicin wrap for 30 minutes in the clinic, providing relief for up to three months. Schedule Qutenza treatment before February 27, 2024. Administer the treatment in the clinic, applying a numbing agent or gel before the Qutenza wrap. Repeat treatment every three months.  1. Chronic painful diabetic neuropathy (HCC) (Primary) - NEUROLYSIS; Future  2. Spinal cord stimulator status  3. Lumbar facet arthropathy - LUMBAR FACET(MEDIAL BRANCH NERVE BLOCK) MBNB; Future  4. Lumbar spondylosis - LUMBAR FACET(MEDIAL BRANCH NERVE BLOCK) MBNB; Future  5. Chronic radicular lumbar pain (right>left L4,5)  6. Chronic pain syndrome - NEUROLYSIS; Future - LUMBAR FACET(MEDIAL BRANCH NERVE BLOCK) MBNB; Future    Procedure Orders         NEUROLYSIS         LUMBAR FACET(MEDIAL BRANCH NERVE BLOCK) MBNB      Provider-requested follow-up: Return in about 13 days (around 02/09/2024) for Qutenza (schedule during RFA or SCS trial).  Future Appointments  Date Time Provider Department Center  03/07/2024  8:30 AM Hilma Hastings, PA-C [facility] None  03/31/2024  9:00 AM Wellington Curtis LABOR, FNP [facility]  06/12/2024  9:00 AM Donette Ellouise LABOR, FNP [facility] None  07/27/2024  8:15 AM [facility] None  07/28/2024 10:00 AM Stoioff, Glendia BROCKS, MD [facility] None   I discussed the assessment and treatment plan with the patient. The patient was provided an opportunity to ask questions and all were answered. The patient agreed with the plan and demonstrated an understanding of the instructions.  Patient advised to call back or seek an in-person evaluation if the symptoms or condition worsens.  Duration of encounter: .  Total time on encounter, as per  AMA guidelines included both the face-to-face and non-face-to-face time personally spent by the physician and/or other qualified health care professional(s) on the day of the encounter (includes time in activities that require the physician or other qualified health care professional and does not include time in activities normally performed by clinical staff). Physician's time may include the following activities when performed: Preparing to see the patient (e.g., pre-charting review of records, searching for previously ordered imaging, lab work, and nerve conduction tests) Review of prior analgesic pharmacotherapies. Reviewing PMP Interpreting ordered tests (e.g., lab work, imaging, nerve conduction tests) Performing post-procedure evaluations, including interpretation of diagnostic procedures Obtaining and/or reviewing separately obtained history Performing a medically appropriate examination and/or evaluation Counseling and educating the patient/family/caregiver Ordering medications, tests, or procedures Referring and communicating with other health care professionals (when not separately reported) Documenting clinical information in the electronic or other health record Independently interpreting results (not separately reported) and communicating results to the patient/ family/caregiver Care coordination (not separately reported)  Note by: Wallie Sherry, MD (TTS and AI technology used. I apologize for any typographical errors that were not detected and corrected.) Date: 01/27/2024; Time: 2:46 PM

## 2024-02-01 ENCOUNTER — Ambulatory Visit (INDEPENDENT_AMBULATORY_CARE_PROVIDER_SITE_OTHER): Admitting: Podiatry

## 2024-02-01 DIAGNOSIS — M7671 Peroneal tendinitis, right leg: Secondary | ICD-10-CM

## 2024-02-01 DIAGNOSIS — D1723 Benign lipomatous neoplasm of skin and subcutaneous tissue of right leg: Secondary | ICD-10-CM

## 2024-02-01 NOTE — Progress Notes (Unsigned)
 Subjective:  Patient ID: Jonathon Newman, male    DOB: 12-18-1955,  MRN: [id number]  Chief Complaint  Patient presents with   Peroneal tendinitis of lower leg, right    MRI results    68 y.o. male presents with the above complaint.  Patient presents for follow-up of right lateral foot peroneal tendinitis.  States nothing has helped.  He got his MRI done and is here to go over the results of possible discuss surgical intervention.     Review of Systems: Negative except as noted in the HPI. Denies N/V/F/Ch.  Past Medical History:  Diagnosis Date   Allergy    Anginal pain (HCC)    Arthritis    BPH (benign prostatic hyperplasia)    CHF (congestive heart failure) (HCC)    COPD (chronic obstructive pulmonary disease) (HCC)    Coronary artery disease 2010   a.) LHC 2010: high grade RCA stenosis -> 2.5 x 26mm Cypher DES to RCA. b.) NSTEMI 2016 -> PCI revealed pat RCA stent; sig dLM Dz with FFR ratio 0.76 and oLCx stenosis; ref to CVTS. c.) 2v CABG 04/21/2015; LIMA-LAD, SVG-OM2. d.) [facility] 05/02/2018: EF 55-65%; LVEDP 14-15 mmHg; 10% p-m RCA, 30% pRCA, 20% dRCA, 95% o-pLCx, 80% m-dLM; LIMA graft pat. SVG graft with mild diffuse Dz.   DDD (degenerative disc disease), lumbar    Dyspnea    GERD (gastroesophageal reflux disease)    Hypercholesteremia    Hypertension    Left foot drop    Neuromuscular disorder (HCC)    NSTEMI (non-ST elevated myocardial infarction) (HCC) 2016   a.) PCI revealed patent RCA stent; significant dLM disease with FFR ratio 0.76 and oLCx stenosis; refer to CVTS for CABG.   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 Takes once a day, Disp: , Rfl:    aspirin  EC 81 MG tablet, Take 81 mg by  mouth daily. Swallow whole., Disp: , Rfl:    atorvastatin  (LIPITOR) 80 MG tablet, Take 1 tablet (80 mg total) by mouth daily., Disp: , Rfl:    bisacodyl  (DULCOLAX) 5 MG EC tablet, Take 5 mg by mouth daily as needed., Disp: , Rfl:    Blood Glucose Monitoring Suppl (FREESTYLE LITE) DEVI, To check blood sugar once daily, Disp: 1 each, Rfl: 0   carvedilol  (COREG ) 12.5 MG tablet, Take 1 tablet (12.5 mg total) by mouth 2 (two) times daily with a meal., Disp: 180 tablet, Rfl: 3   Cholecalciferol (VITAMIN D3) 50 MCG (2000 UT) capsule, Take 2,000 Units by mouth daily., Disp: , Rfl:    clobetasol  (TEMOVATE ) 0.05 % external solution, Apply 1 Application topically 2 (two) times daily., Disp: 50 mL, Rfl: 11   cyanocobalamin  (VITAMIN B12) 1000 MCG tablet, Take 1,000 mcg by mouth daily., Disp: , Rfl:    cyclobenzaprine  (FLEXERIL ) 10 MG tablet, Take 1 tablet (10 mg total) by mouth 3 (three) times daily as needed for muscle spasms., Disp: 90 tablet, Rfl: 1   diazepam  (VALIUM ) 2 MG tablet, Take 2 mg by mouth every 8 (eight) hours as needed., Disp: , Rfl:    ezetimibe  (ZETIA ) 10 MG tablet, TAKE 1 TABLET DAILY, Disp: 90  tablet, Rfl: 3   FREESTYLE LITE test strip, USE TO CHECK BLOOD SUGAR ONCE DAILY, Disp: 100 strip, Rfl: 3   gemfibrozil  (LOPID ) 600 MG tablet, Take 1 tablet (600 mg total) by mouth 2 (two) times daily before a meal., Disp: 180 tablet, Rfl: 3   indomethacin  (INDOCIN ) 50 MG capsule, Take 1 capsule (50 mg total) by mouth daily as needed (gout flare). Taking once daily, Disp: 90 capsule, Rfl: 1   isosorbide  mononitrate (IMDUR ) 60 MG 24 hr tablet, TAKE 1 TABLET DAILY, Disp: 90 tablet, Rfl: 3   JARDIANCE  10 MG TABS tablet, TAKE 1 TABLET DAILY BEFORE BREAKFAST, Disp: 30 tablet, Rfl: 11   ketoconazole  (NIZORAL ) 2 % shampoo, Apply topically 2 (two) times a week., Disp: 120 mL, Rfl: 3   Lancets (FREESTYLE) lancets, USE TO CHECK BLOOD SUGAR ONCE DAILY, Disp: 100 each, Rfl: 4   metFORMIN  (GLUCOPHAGE -XR) 500 MG 24  hr tablet, TAKE 2 TABLETS TWICE A DAY WITH MEALS (NEED FOLLOW UP APPOINTMENT FOR UPDATED LABS AND FURTHER REFILLS), Disp: 120 tablet, Rfl: 11   Multiple Vitamin (MULTIVITAMIN WITH MINERALS) TABS tablet, Take 1 tablet by mouth daily., Disp: , Rfl:    nitroGLYCERIN  (NITROSTAT ) 0.4 MG SL tablet, DISSOLVE 1 TABLET UNDER THE TONGUE EVERY 5 MINUTES AS NEEDED FOR CHEST PAIN, Disp: 25 tablet, Rfl: 5   OZEMPIC , 0.25 OR 0.5 MG/DOSE, 2 MG/3ML SOPN, INJECT 0.5 MG UNDER THE SKIN ONCE A WEEK, Disp: 9 mL, Rfl: 3   pantoprazole  (PROTONIX ) 40 MG tablet, TAKE 1 TABLET DAILY, Disp: 90 tablet, Rfl: 3   potassium chloride  (KLOR-CON ) 10 MEQ tablet, TAKE 2 TABLETS TWICE A DAY, Disp: 360 tablet, Rfl: 3   pregabalin  (LYRICA ) 100 MG capsule, Take 100 mg by mouth 3 (three) times daily., Disp: , Rfl:    Pyrithione Zinc (DERMAZINC SHAMPOO) 2 % SHAM, Apply 1 Dose topically as needed., Disp: 480 mL, Rfl: 0   sacubitril-valsartan (ENTRESTO ) 49-51 MG, Take 1 tablet by mouth 2 (two) times daily., Disp: 180 tablet, Rfl: 3   tamsulosin  (FLOMAX ) 0.4 MG CAPS capsule, Take 1 capsule (0.4 mg total) by mouth daily., Disp: 30 capsule, Rfl: 2   torsemide  (DEMADEX ) 20 MG tablet, Take 2 tablets (40 mg total) by mouth every other day., Disp: 90 tablet, Rfl: 3  Social History   Tobacco Use  Smoking Status Former   Current packs/day: 0.00   Average packs/day: 0.3 packs/day for 2.0 years (0.5 ttl pk-yrs)   Types: Cigarettes, Pipe, Cigars   Start date: 05/27/2003   Quit date: 05/26/2005   Years since quitting: 18.7  Smokeless Tobacco Former   Types: Chew    Allergies  Allergen Reactions   Ace Inhibitors Other (See Comments)   Colchicine  Other (See Comments)    Palpitations and headache   Lisinopril Cough   Objective:  There were no vitals filed for this visit. There is no height or weight on file to calculate BMI. Constitutional Well developed. Well nourished.  Vascular Dorsalis pedis pulses palpable bilaterally. Posterior  tibial pulses palpable bilaterally. Capillary refill normal to all digits.  No cyanosis or clubbing noted. Pedal hair growth normal.  Neurologic Normal speech. Oriented to person, place, and time. Epicritic sensation to light touch grossly present bilaterally.  Dermatologic Nails well groomed and normal in appearance. No open wounds. No skin lesions.  Orthopedic: Pain on palpation to the right right lateral foot along the course of the peroneal tendon proximally to the fibula.  Pain with dorsiflexion eversion of the foot  resisted no pain with plantarflexion inversion of the foot no pain at the Achilles tendon posterior tibial tendon ATFL ligament.  No pain at the ankle joint no deep intra-articular ankle pain noted.  Pes planovalgus foot structure noted. Soft nonindurated mass noted to the right lateral ankle near the peroneal tendon mild pain on palpation with dorsiflexion eversion of the foot.   Radiographs: IMPRESSION: Strain/mild intramuscular tear to the lateral aspect of the distal flexor hallucis musculature. This is best seen on series 4.   Subcentimeter fatty lesion anterior and lateral to the peroneal tendons consistent with a small benign lipoma. Assessment:   1. Peroneal tendinitis of lower leg, right   2. Lipoma of right lower extremity     Plan:  Patient was evaluated and treated and all questions answered.  Right peroneal tendinitis with underlying pes planovalgus with underlying lipoma - All questions or concerns were discussed with the patient in extensive detail clinically patient did not have any resolve meant with injection cam boot immobilization therefore I believe patient would benefit from surgical intervention.  Patient may be experiencing some pain from the lipoma that is near the peroneal tendon which could compressing the peroneal tendon causing the issue.  I discussed this extensively with the patient at this time given that his pain has not resolved I  believe he will benefit from surgical intervention and excision of the ganglion cyst with exploration of the peroneal tendons with a possible repair of the peroneal tendons - I discussed my preoperative entire postoperative plan with the patient in extensive detail patient states understanding would like to proceed with surgery  No follow-ups on file.   Right peroneal tendon repair/exploration and excision of lipoma of right ankle

## 2024-02-02 ENCOUNTER — Telehealth: Payer: Self-pay | Admitting: Podiatry

## 2024-02-02 NOTE — Telephone Encounter (Signed)
 Pt left message yesterday 8/19 @ 956am stating he was seen today and was to be scheduled for surgery and the only day he is able to do it is Thursday 9/25.  I returned call once I got the consent and left message for pt that Dr Anthony surgery day is Monday but to call me to discuss further.

## 2024-02-03 NOTE — Telephone Encounter (Signed)
 Pt called back and is scheduled for 03/27/24 at [facility] and does take aspirin  and ozempic  and I made him aware he would need to stop those 1 week prior to surgery.  [facility] to [facility] coverage. So if you could call in any medication a few days prior to the surgery so after surgery he will have the medication. [facility] a couple of days for delivery.

## 2024-02-04 ENCOUNTER — Encounter: Payer: Self-pay | Admitting: Podiatry

## 2024-02-07 ENCOUNTER — Telehealth: Payer: Self-pay | Admitting: Family

## 2024-02-07 NOTE — Telephone Encounter (Signed)
 Called to r/s December appt

## 2024-02-09 ENCOUNTER — Ambulatory Visit
Attending: Student in an Organized Health Care Education/Training Program | Admitting: Student in an Organized Health Care Education/Training Program

## 2024-02-09 ENCOUNTER — Encounter: Payer: Self-pay | Admitting: Student in an Organized Health Care Education/Training Program

## 2024-02-09 DIAGNOSIS — E1140 Type 2 diabetes mellitus with diabetic neuropathy, unspecified: Secondary | ICD-10-CM | POA: Diagnosis not present

## 2024-02-09 DIAGNOSIS — G894 Chronic pain syndrome: Secondary | ICD-10-CM | POA: Diagnosis not present

## 2024-02-09 MED ORDER — CAPSAICIN-CLEANSING GEL 8 % EX KIT
4.0000 | PACK | Freq: Once | CUTANEOUS | Status: AC
  Administered 2024-02-09: 4 via TOPICAL
  Filled 2024-02-09: qty 4

## 2024-02-09 MED ORDER — LIDOCAINE HCL 2 % IJ SOLN
INTRAMUSCULAR | Status: AC
  Filled 2024-02-09: qty 20

## 2024-02-09 MED ORDER — DEXAMETHASONE SODIUM PHOSPHATE 10 MG/ML IJ SOLN
INTRAMUSCULAR | Status: AC
  Filled 2024-02-09: qty 1

## 2024-02-09 MED ORDER — ROPIVACAINE HCL 2 MG/ML IJ SOLN
INTRAMUSCULAR | Status: AC
  Filled 2024-02-09: qty 20

## 2024-02-09 NOTE — Progress Notes (Signed)
 PROVIDER NOTE: Interpretation of information contained herein should be left to medically-trained personnel. Specific patient instructions are provided elsewhere under Patient Instructions section of medical record. This document was created in part using STT-dictation technology, any transcriptional errors that may result from this process are unintentional.  Patient: Jonathon Newman Type: Established DOB: 06/18/1955 MRN: [id number] PCP: Wellington Curtis LABOR, FNP  Service: Procedure DOS: 02/09/2024 Setting: Ambulatory Location: Ambulatory outpatient facility Delivery: Face-to-face Provider: Wallie Sherry, MD Specialty: Interventional Pain Management Specialty designation: 09 Location: Outpatient facility Ref. Prov.: Sherry Wallie, MD       Interventional Therapy   Type: Qutenza  Neurolysis #1  Laterality:  Bilateral Area treated: Feet Imaging Guidance: None Anesthesia/analgesia/anxiolysis/sedation: None required Medication (Right): Qutenza  (capsaicin  8%) topical system Medication (Left): Qutenza  (capsaicin  8%) topical system Date: 02/09/2024 Performed by: Wallie Sherry, MD Rationale (medical necessity): procedure needed and proper for the treatment of Mr. Tvedt's medical symptoms and needs. 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 1. Chronic painful diabetic neuropathy (HCC)   2. Chronic pain syndrome    NAS-11 Pain score:   Pre-procedure: 6 /10   Post-procedure: 6 /10     Position / Prep / Materials:  Position: Supine  Materials: Qutenza  Kit (4 patches)  H&P (Pre-op Assessment):  Mr. Rockers is a 68 y.o. (year old), male patient, seen today for interventional treatment. He  has a past surgical history that includes Appendectomy; Cholecystectomy; Knee surgery; Cardiac catheterization; Coronary stent placement (2010); Coronary artery bypass graft (04/21/2015); Vasectomy; Knee surgery;  Cholecystectomy; Carpal tunnel release; Fracture surgery; Esophageal manometry (N/A, 08/04/2017); Back surgery (11/01/2017); Laminectomy; RIGHT/LEFT HEART CATH AND CORONARY ANGIOGRAPHY (Bilateral, 05/02/2018); LEFT HEART CATH AND CORS/GRAFTS ANGIOGRAPHY (N/A, 05/02/2018); Thoracic laminectomy for spinal cord stimulator (N/A, 05/26/2021); Esophagogastroduodenoscopy (egd) with propofol  (N/A, 06/17/2022); Colonoscopy with propofol  (N/A, 10/28/2022); Spine surgery; and neck sx with plate. Mr. Franzen has a current medication list which includes the following prescription(s): acetaminophen , aspirin  ec, atorvastatin , bisacodyl , freestyle lite, carvedilol , vitamin d3, clobetasol , cyanocobalamin , cyclobenzaprine , diazepam , ezetimibe , freestyle lite, gemfibrozil , indomethacin , isosorbide  mononitrate, jardiance , ketoconazole , freestyle, metformin , multivitamin with minerals, nitroglycerin , ozempic  (0.25 or 0.5 mg/dose), pantoprazole , potassium chloride , pregabalin , dermazinc shampoo, entresto , tamsulosin , and torsemide . His primarily concern today is the Peripheral Neuropathy (Bilat feet)  Initial Vital Signs:  Pulse/HCG Rate: 65  Temp: (!) 97 F (36.1 C) Resp: 16 BP: (!) 153/96 SpO2: 99 %  BMI: Estimated body mass index is 31.95 kg/m as calculated from the following:   Height as of this encounter: 5' 7 (1.702 m).   Weight as of this encounter: 204 lb (92.5 kg).  Risk Assessment: Allergies: Reviewed. He is allergic to ace inhibitors, colchicine , and lisinopril.  Allergy Precautions: None required Coagulopathies: Reviewed. None identified.  Blood-thinner therapy: None at this time Active Infection(s): Reviewed. None identified. Mr. Ide is afebrile  Site Confirmation: Mr. Naji was asked to confirm the procedure and laterality before marking the site Procedure checklist: Completed Consent: Before the procedure and under the influence of no sedative(s), amnesic(s), or anxiolytics, the patient  was informed of the treatment options, risks and possible complications. To fulfill our ethical and legal obligations, as recommended by the American Medical Association's Code of Ethics, I have informed the patient of my clinical impression; the nature and purpose of the treatment or procedure; the risks, benefits, and possible complications of the intervention; the alternatives, including doing nothing; the risk(s) and benefit(s) of the alternative treatment(s) or procedure(s); and the risk(s) and benefit(s) of  doing nothing. The patient was provided information about the general risks and possible complications associated with the procedure. These may include, but are not limited to: failure to achieve desired goals, infection, bleeding, organ or nerve damage, allergic reactions, paralysis, and death. In addition, the patient was informed of those risks and complications associated to the procedure, such as failure to decrease pain; infection; bleeding; organ or nerve damage with subsequent damage to sensory, motor, and/or autonomic systems, resulting in permanent pain, numbness, and/or weakness of one or several areas of the body; allergic reactions; (i.e.: anaphylactic reaction); and/or death. Furthermore, the patient was informed of those risks and complications associated with the medications. These include, but are not limited to: allergic reactions (i.e.: anaphylactic or anaphylactoid reaction(s)); adrenal axis suppression; blood sugar elevation that in diabetics may result in ketoacidosis or comma; water  retention that in patients with history of congestive heart failure may result in shortness of breath, pulmonary edema, and decompensation with resultant heart failure; weight gain; swelling or edema; medication-induced neural toxicity; particulate matter embolism and blood vessel occlusion with resultant organ, and/or nervous system infarction; and/or aseptic necrosis of one or more joints. Finally,  the patient was informed that Medicine is not an exact science; therefore, there is also the possibility of unforeseen or unpredictable risks and/or possible complications that may result in a catastrophic outcome. The patient indicated having understood very clearly. We have given the patient no guarantees and we have made no promises. Enough time was given to the patient to ask questions, all of which were answered to the patient's satisfaction. Mr. Yohe has indicated that he wanted to continue with the procedure. Attestation: I, the ordering provider, attest that I have discussed with the patient the benefits, risks, side-effects, alternatives, likelihood of achieving goals, and potential problems during recovery for the procedure that I have provided informed consent. Date  Time: 02/09/2024  7:56 AM  Pre-Procedure Preparation:  Monitoring: As per clinic protocol. Respiration, ETCO2, SpO2, BP, heart rate and rhythm monitor placed and checked for adequate function Safety Precautions: Patient was assessed for positional comfort and pressure points before starting the procedure. Time-out: I initiated and conducted the Time-out before starting the procedure, as per protocol. The patient was asked to participate by confirming the accuracy of the Time Out information. Verification of the correct person, site, and procedure were performed and confirmed by me, the nursing staff, and the patient. Time-out conducted as per Joint Commission's Universal Protocol (UP.01.01.01). Time: 0814 Start Time: 0822 hrs.  Description/Narrative of Procedure:          Region: Distal lower extremities Target Area: Sensory peripheral nerves affected by diabetic peripheral neuropathy Site: Feet Approach: Percutaneous  No./Series: Not applicable  Type: Percutaneous  Purpose: Therapeutic  Region: Distal lower extremities  Start Time: 0822 hrs.  Description of the Procedure: Protocol guidelines were followed.  The patient was assisted into a comfortable position.  Informed consent was obtained in the patient monitored in the usual manner.  All questions were answered prior to the procedure.  They Qutenza  patches were applied to the affected area and then covered with the wrap.  The Patient was kept under observation until the treatment was completed.  The patches were removed and the treated area was inspected.  Vitals:   02/09/24 0808 02/09/24 0811  BP: (!) 153/96 (!) 164/85  Pulse: 65 61  Resp: 16 16  Temp: (!) 97 F (36.1 C) (!) 96.9 F (36.1 C)  SpO2: 99% 99%  Weight: 204  lb (92.5 kg)   Height: 5' 7 (1.702 m)      End Time:   hrs.  Type of Imaging Technique: None used Indication(s): N/A Exposure Time: No patient exposure Contrast: None used. Fluoroscopic Guidance: N/A Ultrasound Guidance: N/A Interpretation: N/A  Post-operative Assessment:  Post-procedure Vital Signs:  Pulse/HCG Rate: 61  Temp: (!) 96.9 F (36.1 C) Resp: 16 BP: (!) 164/85 SpO2: 99 %  EBL: None  Complications: No immediate post-treatment complications observed by team, or reported by patient.  Note: The patient tolerated the entire procedure well. A repeat set of vitals were taken after the procedure and the patient was kept under observation following institutional policy, for this type of procedure. Post-procedural neurological assessment was performed, showing return to baseline, prior to discharge. The patient was provided with post-procedure discharge instructions, including a section on how to identify potential problems. Should any problems arise concerning this procedure, the patient was given instructions to immediately contact us , at any time, without hesitation. In any case, we plan to contact the patient by telephone for a follow-up status report regarding this interventional procedure.  Comments:  No additional relevant information.  Plan of Care (POC)  Orders:  Orders Placed This Encounter   Procedures   NEUROLYSIS    Please order Qutenza  patches    Standing Status:   Future    Expected Date:   05/11/2024    Expiration Date:   02/08/2025    Where will this procedure be performed?:   [facility]    Medications ordered for procedure: Meds ordered this encounter  Medications   capsaicin  topical system 8 % patch 4 patch    Disregard order if transmitted to pharmacy. Qutenza  Kit to be used from [facility].   Medications administered: We administered capsaicin  topical system.  See the medical record for exact dosing, route, and time of administration.    Qutenza  No. 1 27 2025    Follow-up plan:   Return in about 3 months (around 05/11/2024) for Qutenza .     Recent Visits Date Type Provider Dept  01/27/24 Office Visit Marcelino Nurse, MD [facility]  Showing recent visits within past 90 days and meeting all other requirements Today's Visits Date Type Provider Dept  02/09/24 Procedure visit Marcelino Nurse, MD [facility]  Showing today's visits and meeting all other requirements Future Appointments Date Type Provider Dept  03/06/24 Appointment Marcelino Nurse, MD [facility]  Showing future appointments within next 90 days and meeting all other requirements   Disposition: Discharge home  Discharge (Date  Time): 02/09/2024; 0925 hrs.   Primary Care Physician: Wellington Curtis LABOR, FNP Location: [facility] Note by: Nurse Marcelino, MD (TTS technology used. I apologize for any typographical errors that were not detected and corrected.) Date: 02/09/2024; Time: 9:45 AM  Disclaimer:  Medicine is not an Visual merchandiser. The only guarantee in medicine is that nothing is guaranteed. It is important to note that the decision to proceed with this intervention was based on the information collected from the patient. The Data and conclusions were drawn from the patient's questionnaire, the interview,  and the physical examination. Because the information was provided in large part by the patient, it cannot be guaranteed that it has not been purposely or unconsciously manipulated. Every effort has been made to obtain as much relevant data as possible for this evaluation. It is important to note that the conclusions that lead to this procedure  are derived in large part from the available data. Always take into account that the treatment will also be dependent on availability of resources and existing treatment guidelines, considered by other Pain Management Practitioners as being common knowledge and practice, at the time of the intervention. For Medico-Legal purposes, it is also important to point out that variation in procedural techniques and pharmacological choices are the acceptable norm. The indications, contraindications, technique, and results of the above procedure should only be interpreted and judged by a Board-Certified Interventional Pain Specialist with extensive familiarity and expertise in the same exact procedure and technique.

## 2024-02-09 NOTE — Progress Notes (Signed)
 Safety precautions to be maintained throughout the outpatient stay will include: orient to surroundings, keep bed in low position, maintain call bell within reach at all times, provide assistance with transfer out of bed and ambulation.

## 2024-02-09 NOTE — Patient Instructions (Signed)
GENERAL RISKS AND COMPLICATIONS  What are the risk, side effects and possible complications? Generally speaking, most procedures are safe.  However, with any procedure there are risks, side effects, and the possibility of complications.  The risks and complications are dependent upon the sites that are lesioned, or the type of nerve block to be performed.  The closer the procedure is to the spine, the more serious the risks are.  Great care is taken when placing the radio frequency needles, block needles or lesioning probes, but sometimes complications can occur. Infection: Any time there is an injection through the skin, there is a risk of infection.  This is why sterile conditions are used for these blocks.  There are four possible types of infection. Localized skin infection. Central Nervous System Infection-This can be in the form of Meningitis, which can be deadly. Epidural Infections-This can be in the form of an epidural abscess, which can cause pressure inside of the spine, causing compression of the spinal cord with subsequent paralysis. This would require an emergency surgery to decompress, and there are no guarantees that the patient would recover from the paralysis. Discitis-This is an infection of the intervertebral discs.  It occurs in about 1% of discography procedures.  It is difficult to treat and it may lead to surgery.        2. Pain: the needles have to go through skin and soft tissues, will cause soreness.       3. Damage to internal structures:  The nerves to be lesioned may be near blood vessels or    other nerves which can be potentially damaged.       4. Bleeding: Bleeding is more common if the patient is taking blood thinners such as  aspirin, Coumadin, Ticiid, Plavix, etc., or if he/she have some genetic predisposition  such as hemophilia. Bleeding into the spinal canal can cause compression of the spinal  cord with subsequent paralysis.  This would require an emergency  surgery to  decompress and there are no guarantees that the patient would recover from the  paralysis.       5. Pneumothorax:  Puncturing of a lung is a possibility, every time a needle is introduced in  the area of the chest or upper back.  Pneumothorax refers to free air around the  collapsed lung(s), inside of the thoracic cavity (chest cavity).  Another two possible  complications related to a similar event would include: Hemothorax and Chylothorax.   These are variations of the Pneumothorax, where instead of air around the collapsed  lung(s), you may have blood or chyle, respectively.       6. Spinal headaches: They may occur with any procedures in the area of the spine.       7. Persistent CSF (Cerebro-Spinal Fluid) leakage: This is a rare problem, but may occur  with prolonged intrathecal or epidural catheters either due to the formation of a fistulous  track or a dural tear.       8. Nerve damage: By working so close to the spinal cord, there is always a possibility of  nerve damage, which could be as serious as a permanent spinal cord injury with  paralysis.       9. Death:  Although rare, severe deadly allergic reactions known as "Anaphylactic  reaction" can occur to any of the medications used.      10. Worsening of the symptoms:  We can always make thing worse.  What are the chances  of something like this happening? Chances of any of this occuring are extremely low.  By statistics, you have more of a chance of getting killed in a motor vehicle accident: while driving to the hospital than any of the above occurring .  Nevertheless, you should be aware that they are possibilities.  In general, it is similar to taking a shower.  Everybody knows that you can slip, hit your head and get killed.  Does that mean that you should not shower again?  Nevertheless always keep in mind that statistics do not mean anything if you happen to be on the wrong side of them.  Even if a procedure has a 1 (one) in a  1,000,000 (million) chance of going wrong, it you happen to be that one..Also, keep in mind that by statistics, you have more of a chance of having something go wrong when taking medications.  Who should not have this procedure? If you are on a blood thinning medication (e.g. Coumadin, Plavix, see list of "Blood Thinners"), or if you have an active infection going on, you should not have the procedure.  If you are taking any blood thinners, please inform your physician.  How should I prepare for this procedure? Do not eat or drink anything at least six hours prior to the procedure. Bring a driver with you .  It cannot be a taxi. Come accompanied by an adult that can drive you back, and that is strong enough to help you if your legs get weak or numb from the local anesthetic. Take all of your medicines the morning of the procedure with just enough water to swallow them. If you have diabetes, make sure that you are scheduled to have your procedure done first thing in the morning, whenever possible. If you have diabetes, take only half of your insulin dose and notify our nurse that you have done so as soon as you arrive at the clinic. If you are diabetic, but only take blood sugar pills (oral hypoglycemic), then do not take them on the morning of your procedure.  You may take them after you have had the procedure. Do not take aspirin or any aspirin-containing medications, at least eleven (11) days prior to the procedure.  They may prolong bleeding. Wear loose fitting clothing that may be easy to take off and that you would not mind if it got stained with Betadine or blood. Do not wear any jewelry or perfume Remove any nail coloring.  It will interfere with some of our monitoring equipment.  NOTE: Remember that this is not meant to be interpreted as a complete list of all possible complications.  Unforeseen problems may occur.  BLOOD THINNERS The following drugs contain aspirin or other products,  which can cause increased bleeding during surgery and should not be taken for 2 weeks prior to and 1 week after surgery.  If you should need take something for relief of minor pain, you may take acetaminophen which is found in Tylenol,m Datril, Anacin-3 and Panadol. It is not blood thinner. The products listed below are.  Do not take any of the products listed below in addition to any listed on your instruction sheet.  A.P.C or A.P.C with Codeine Codeine Phosphate Capsules #3 Ibuprofen Ridaura  ABC compound Congesprin Imuran rimadil  Advil Cope Indocin Robaxisal  Alka-Seltzer Effervescent Pain Reliever and Antacid Coricidin or Coricidin-D  Indomethacin Rufen  Alka-Seltzer plus Cold Medicine Cosprin Ketoprofen S-A-C Tablets  Anacin Analgesic Tablets or Capsules Coumadin  Korlgesic Salflex  Anacin Extra Strength Analgesic tablets or capsules CP-2 Tablets Lanoril Salicylate  Anaprox Cuprimine Capsules Levenox Salocol  Anexsia-D Dalteparin Magan Salsalate  Anodynos Darvon compound Magnesium Salicylate Sine-off  Ansaid Dasin Capsules Magsal Sodium Salicylate  Anturane Depen Capsules Marnal Soma  APF Arthritis pain formula Dewitt's Pills Measurin Stanback  Argesic Dia-Gesic Meclofenamic Sulfinpyrazone  Arthritis Bayer Timed Release Aspirin Diclofenac Meclomen Sulindac  Arthritis pain formula Anacin Dicumarol Medipren Supac  Analgesic (Safety coated) Arthralgen Diffunasal Mefanamic Suprofen  Arthritis Strength Bufferin Dihydrocodeine Mepro Compound Suprol  Arthropan liquid Dopirydamole Methcarbomol with Aspirin Synalgos  ASA tablets/Enseals Disalcid Micrainin Tagament  Ascriptin Doan's Midol Talwin  Ascriptin A/D Dolene Mobidin Tanderil  Ascriptin Extra Strength Dolobid Moblgesic Ticlid  Ascriptin with Codeine Doloprin or Doloprin with Codeine Momentum Tolectin  Asperbuf Duoprin Mono-gesic Trendar  Aspergum Duradyne Motrin or Motrin IB Triminicin  Aspirin plain, buffered or enteric coated  Durasal Myochrisine Trigesic  Aspirin Suppositories Easprin Nalfon Trillsate  Aspirin with Codeine Ecotrin Regular or Extra Strength Naprosyn Uracel  Atromid-S Efficin Naproxen Ursinus  Auranofin Capsules Elmiron Neocylate Vanquish  Axotal Emagrin Norgesic Verin  Azathioprine Empirin or Empirin with Codeine Normiflo Vitamin E  Azolid Emprazil Nuprin Voltaren  Bayer Aspirin plain, buffered or children's or timed BC Tablets or powders Encaprin Orgaran Warfarin Sodium  Buff-a-Comp Enoxaparin Orudis Zorpin  Buff-a-Comp with Codeine Equegesic Os-Cal-Gesic   Buffaprin Excedrin plain, buffered or Extra Strength Oxalid   Bufferin Arthritis Strength Feldene Oxphenbutazone   Bufferin plain or Extra Strength Feldene Capsules Oxycodone with Aspirin   Bufferin with Codeine Fenoprofen Fenoprofen Pabalate or Pabalate-SF   Buffets II Flogesic Panagesic   Buffinol plain or Extra Strength Florinal or Florinal with Codeine Panwarfarin   Buf-Tabs Flurbiprofen Penicillamine   Butalbital Compound Four-way cold tablets Penicillin   Butazolidin Fragmin Pepto-Bismol   Carbenicillin Geminisyn Percodan   Carna Arthritis Reliever Geopen Persantine   Carprofen Gold's salt Persistin   Chloramphenicol Goody's Phenylbutazone   Chloromycetin Haltrain Piroxlcam   Clmetidine heparin Plaquenil   Cllnoril Hyco-pap Ponstel   Clofibrate Hydroxy chloroquine Propoxyphen         Before stopping any of these medications, be sure to consult the physician who ordered them.  Some, such as Coumadin (Warfarin) are ordered to prevent or treat serious conditions such as "deep thrombosis", "pumonary embolisms", and other heart problems.  The amount of time that you may need off of the medication may also vary with the medication and the reason for which you were taking it.  If you are taking any of these medications, please make sure you notify your pain physician before you undergo any procedures.         Moderate Conscious  Sedation, Adult Sedation is the use of medicines to help you relax and not feel pain. Moderate conscious sedation is a type of sedation that makes you less alert than normal. You are still able to respond to instructions, touch, or both. This type of sedation is used during short medical and dental procedures. It is milder than deep sedation, which is a type of sedation you cannot be easily woken up from. It is also milder than general anesthesia, which is the use of medicines to make you fall asleep. Moderate conscious sedation lets you return to your normal activities sooner. Tell a health care provider about: Any allergies you have. All medicines you are taking, including vitamins, herbs, steroids, eye drops, creams, and over-the-counter medicines. Any problems you or family members have had with anesthesia.  Any bleeding problems you have. Any surgeries you have had. Any medical conditions you have. Whether you are pregnant or may be pregnant. Any recent alcohol, tobacco, or drug use. What are the risks? Your health care provider will talk with you about risks. These may include: Oversedation. This is when you get too much medicine. Nausea or vomiting. Allergic reaction to medicines. Trouble breathing. If this happens, a breathing tube may be used. It will be removed when you can breathe better on your own. Heart trouble. Lung trouble. Emergence delirium. This is when you feel confused while the sedation wears off. This gets better with time. What happens before the procedure? When to stop eating and drinking Follow instructions from your health care provider about what you may eat and drink. These may include: 8 hours before your procedure Stop eating most foods. Do not eat meat, fried foods, or fatty foods. Eat only light foods, such as toast or crackers. All liquids are okay except energy drinks and alcohol. 6 hours before your procedure Stop eating. Drink only clear liquids, such  as water, clear fruit juice, black coffee, plain tea, and sports drinks. Do not drink energy drinks or alcohol. 2 hours before your procedure Stop drinking all liquids. You may be allowed to take medicines with small sips of water. If you do not follow your health care provider's instructions, your procedure may be delayed or canceled. Medicines Ask your health care provider about: Changing or stopping your regular medicines. These include any diabetes medicines or blood thinners you take. Taking medicines such as aspirin and ibuprofen. These medicines can thin your blood. Do not take them unless your health care provider tells you to. Taking over-the-counter medicines, vitamins, herbs, and supplements. Tests and exams You may have an exam or testing. You may have a blood or urine sample taken. General instructions Do not use any products that contain nicotine or tobacco for at least 4 weeks before the procedure. These products include cigarettes, chewing tobacco, and vaping devices, such as e-cigarettes. If you need help quitting, ask your health care provider. If you will be going home right after the procedure, plan to have a responsible adult: Take you home from the hospital or clinic. You will not be allowed to drive. Care for you for the time you are told. What happens during the procedure?  You will be given the sedative. It may be given: As a pill you can take by mouth. It can also be put into the rectum. As a spray through the nose. As an injection into muscle. As an injection into a vein through an IV. You may be given oxygen as needed. Your blood pressure, heart rate, breathing rate, and blood oxygen level will be monitored during the procedure. The medical or dental procedure will be done. The procedure may vary among health care providers and hospitals. What happens after the procedure? Your blood pressure, heart rate, breathing rate, and blood oxygen level will be  monitored until you leave the hospital or clinic. You will get fluids through an IV as needed. Do not drive or operate machinery until your health care provider says that it is safe. This information is not intended to replace advice given to you by your health care provider. Make sure you discuss any questions you have with your health care provider. Document Revised: 12/15/2021 Document Reviewed: 12/15/2021 Elsevier Patient Education  2024 [employer] Facet Blocks Patient Information  Description: The facets are joints in the spine between  the vertebrae.  Like any joints in the body, facets can become irritated and painful.  Arthritis can also effect the facets.  By injecting steroids and local anesthetic in and around these joints, we can temporarily block the nerve supply to them.  Steroids act directly on irritated nerves and tissues to reduce selling and inflammation which often leads to decreased pain.  Facet blocks may be done anywhere along the spine from the neck to the low back depending upon the location of your pain.   After numbing the skin with local anesthetic (like Novocaine), a small needle is passed onto the facet joints under x-ray guidance.  You may experience a sensation of pressure while this is being done.  The entire block usually lasts about 15-25 minutes.   Conditions which may be treated by facet blocks:  Low back/buttock pain Neck/shoulder pain Certain types of headaches  Preparation for the injection:  Do not eat any solid food or dairy products within 8 hours of your appointment. You may drink clear liquid up to 3 hours before appointment.  Clear liquids include water, black coffee, juice or soda.  No milk or cream please. You may take your regular medication, including pain medications, with a sip of water before your appointment.  Diabetics should hold regular insulin (if taken separately) and take 1/2 normal NPH dose the morning of the procedure.  Carry some  sugar containing items with you to your appointment. A driver must accompany you and be prepared to drive you home after your procedure. Bring all your current medications with you. An IV may be inserted and sedation may be given at the discretion of the physician. A blood pressure cuff, EKG and other monitors will often be applied during the procedure.  Some patients may need to have extra oxygen administered for a short period. You will be asked to provide medical information, including your allergies and medications, prior to the procedure.  We must know immediately if you are taking blood thinners (like Coumadin/Warfarin) or if you are allergic to IV iodine contrast (dye).  We must know if you could possible be pregnant.  Possible side-effects:  Bleeding from needle site Infection (rare, may require surgery) Nerve injury (rare) Numbness & tingling (temporary) Difficulty urinating (rare, temporary) Spinal headache (a headache worse with upright posture) Light-headedness (temporary) Pain at injection site (serveral days) Decreased blood pressure (rare, temporary) Weakness in arm/leg (temporary) Pressure sensation in back/neck (temporary)   Call if you experience:  Fever/chills associated with headache or increased back/neck pain Headache worsened by an upright position New onset, weakness or numbness of an extremity below the injection site Hives or difficulty breathing (go to the emergency room) Inflammation or drainage at the injection site(s) Severe back/neck pain greater than usual New symptoms which are concerning to you  Please note:  Although the local anesthetic injected can often make your back or neck feel good for several hours after the injection, the pain will likely return. It takes 3-7 days for steroids to work.  You may not notice any pain relief for at least one week.  If effective, we will often do a series of 2-3 injections spaced 3-6 weeks apart to maximally  decrease your pain.  After the initial series, you may be a candidate for a more permanent nerve block of the facets.  If you have any questions, please call #[phone] [facility]

## 2024-02-10 ENCOUNTER — Telehealth: Payer: Self-pay | Admitting: *Deleted

## 2024-02-10 NOTE — Telephone Encounter (Signed)
No problems post [employer] application. 

## 2024-02-11 ENCOUNTER — Telehealth: Payer: Self-pay | Admitting: Family

## 2024-02-11 NOTE — Telephone Encounter (Signed)
 Called to r/s December appt

## 2024-02-21 DIAGNOSIS — R29818 Other symptoms and signs involving the nervous system: Secondary | ICD-10-CM | POA: Diagnosis not present

## 2024-02-21 DIAGNOSIS — R200 Anesthesia of skin: Secondary | ICD-10-CM | POA: Diagnosis not present

## 2024-02-21 DIAGNOSIS — Z1331 Encounter for screening for depression: Secondary | ICD-10-CM | POA: Diagnosis not present

## 2024-02-21 DIAGNOSIS — M21372 Foot drop, left foot: Secondary | ICD-10-CM | POA: Diagnosis not present

## 2024-02-21 DIAGNOSIS — R42 Dizziness and giddiness: Secondary | ICD-10-CM | POA: Diagnosis not present

## 2024-02-21 DIAGNOSIS — R202 Paresthesia of skin: Secondary | ICD-10-CM | POA: Diagnosis not present

## 2024-03-02 ENCOUNTER — Telehealth: Payer: Self-pay | Admitting: Podiatry

## 2024-03-02 NOTE — Telephone Encounter (Signed)
 DOS- 03/27/2024  EXCISION OF LESION OF TENDON OR CAPSULE, LEG AND/OR ANKLE, (CYST OR GANGLION) RT- 27630 REPAIR POST TIBIAL TENDON, SECONDARY  RT- [id number]  [employer] EFFECTIVE DATE- 11/14/2022  DEDUCTIBLE- N/A OOP- $9350 REMAINING- $6420.65 COINSURANCE- 0%  PER [employer] WEBSITE, NO PRIOR AUTHS ARE REQUIRED FOR CPT CODES 72369 AND [id number]. DOCUMENTATION ATTACHED TO SURGERY CONSENT PACKET.

## 2024-03-03 NOTE — Progress Notes (Signed)
 Referring Physician:  Wellington Curtis LABOR, FNP [address] [place],  [place] [address]  Primary Physician:  Wellington Curtis LABOR, FNP  History of Present Illness: [name] has a history of CHF, HTN, DM, CAD, OSA, hyperlipidemia, vertigo, BPH, CABG x 2, left foot drop, NSTEMI.   He is s/p SCS by Dr. Bluford 05/26/21. He has known left foot drop. Also with history of ACDF.   He did great after SCS with minimal pain.   Last seen by me on 12/28/23 for constant LBP with right lateral leg pain to his foot. No left leg pain pain.   He has known retrolisthesis L3-L4 and spondylolisthesis L4-L5. Slip at L4-L5 is a little worse with flexion. Also with disc at L4-L5 with moderate right/mild left subarticular stenosis and moderate bilateral foraminal stenosis along with left sided disc L5-S1 with moderate left/mild right foraminal stenosis.   He was sent to pain management to consider lumbar injections. They discussed lumbar MBB along with SCS. Also discussed qutenza  treatments.   He had his first qutenza  neurolysis on 02/09/24. He had bilateral MBB L3-L5 yesterday.   He is here for follow up.   He has not seen any improvement with injection yesterday. He continues with constant LBP with right lateral leg pain to his foot. He has occasional left leg pain lateral to his foot as well. He has pain with standing and walking. Pain is also worse with prolonged sitting. He has numbness and tingling in his feet- he has known neuropathy.   He is being scheduled for right ankle surgery by podiatry on 03/27/24.   Advised by renal to avoid NSAIDs as much as possible. He is taking flexeril , tylenol , indocin , lyrica .   He does not smoke.   Bowel/Bladder Dysfunction: occasional bowel urgency, no incontinence. No perineal numbness.   Conservative measures:  Physical therapy: has not participated in PT  Multimodal medical therapy including regular antiinflammatories: Lyrica , Tylenol ,  Flexeril , valium , indocin , ultram  Injections:  no epidural steroid injections  Past Surgery:  05/26/2021 SCS placement with Dr. Bluford ACDF C6-C7 L4-L5 laminectomy with resection of recurrent cyst 05/26/18- [facility] L4-L5 hemilaminectomy/discectomy 11/01/17- [facility]  [name] has some balance issues that are intermittent. No dexterity issues.  The symptoms are causing a significant impact on the patient's life.   Review of Systems:  A 10 point review of systems is negative, except for the pertinent positives and negatives detailed in the HPI.  Past Medical History: Past Medical History:  Diagnosis Date   Allergy    Anginal pain    Arthritis    BPH (benign prostatic hyperplasia)    CHF (congestive heart failure) (HCC)    COPD (chronic obstructive pulmonary disease) (HCC)    Coronary artery disease 2010   a.) LHC 2010: high grade RCA stenosis -> 2.5 x 26mm Cypher DES to RCA. b.) NSTEMI 2016 -> PCI revealed pat RCA stent; sig dLM Dz with FFR ratio 0.76 and oLCx stenosis; ref to CVTS. c.) 2v CABG 04/21/2015; LIMA-LAD, SVG-OM2. d.) [facility] 05/02/2018: EF 55-65%; LVEDP 14-15 mmHg; 10% p-m RCA, 30% pRCA, 20% dRCA, 95% o-pLCx, 80% m-dLM; LIMA graft pat. SVG graft with mild diffuse Dz.   DDD (degenerative disc disease), lumbar    Dyspnea    GERD (gastroesophageal reflux disease)    Hypercholesteremia    Hypertension    Left foot drop    Neuromuscular disorder (HCC)    NSTEMI (non-ST elevated myocardial infarction) (HCC) 2016   a.) PCI revealed  patent RCA stent; significant dLM disease with FFR ratio 0.76 and oLCx stenosis; refer to CVTS for CABG.   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   a.) 2v CABG; LIMA-LAD, SVG-OM2   Sleep apnea    T2DM (type 2 diabetes mellitus) (HCC)     Past Surgical History: Past Surgical History:  Procedure Laterality Date   APPENDECTOMY     BACK SURGERY  11/01/2017   SL 5 and S1   CARDIAC CATHETERIZATION     CARPAL TUNNEL  RELEASE     left hand   CHOLECYSTECTOMY     CHOLECYSTECTOMY     COLONOSCOPY WITH PROPOFOL  N/A 10/28/2022   Procedure: COLONOSCOPY WITH PROPOFOL ;  Surgeon: Therisa Bi, MD;  Location: [facility] ENDOSCOPY;  Service: Gastroenterology;  Laterality: N/A;   CORONARY ARTERY BYPASS GRAFT  04/21/2015   CABG x 2 [facility] LIMA to LAD amd SVG to OM2   CORONARY STENT PLACEMENT  2010   Cordis Cypher Sirolimus-eluting stent 2.50 mm x 26 mm placed to the RCA at [facility]    ESOPHAGEAL MANOMETRY N/A 08/04/2017   Procedure: ESOPHAGEAL MANOMETRY (EM);  Surgeon: Unk Corinn Skiff, MD;  Location: [facility] ENDOSCOPY;  Service: Endoscopy;  Laterality: N/A;   ESOPHAGOGASTRODUODENOSCOPY (EGD) WITH PROPOFOL  N/A 06/17/2022   Procedure: ESOPHAGOGASTRODUODENOSCOPY (EGD) WITH PROPOFOL ;  Surgeon: Therisa Bi, MD;  Location: [facility] ENDOSCOPY;  Service: Gastroenterology;  Laterality: N/A;   FRACTURE SURGERY     KNEE SURGERY     KNEE SURGERY     right knee    LAMINECTOMY     plate in neck R6-R5   LEFT HEART CATH AND CORS/GRAFTS ANGIOGRAPHY N/A 05/02/2018   Procedure: CORS/GRAFTS ANGIOGRAPHY;  Surgeon: Darron Deatrice LABOR, MD;  Location: [facility] INVASIVE CV LAB;  Service: Cardiovascular;  Laterality: N/A;   neck sx with plate     RIGHT/LEFT HEART CATH AND CORONARY ANGIOGRAPHY Bilateral 05/02/2018   Procedure: LEFT HEART CATH;  Surgeon: Darron Deatrice LABOR, MD;  Location: [facility] INVASIVE CV LAB;  Service: Cardiovascular;  Laterality: Bilateral;   SPINE SURGERY     THORACIC LAMINECTOMY FOR SPINAL CORD STIMULATOR N/A 05/26/2021   Procedure: THORACIC SPINAL CORD STIMULATOR AND PULSE GENERATOR PLACEMENT (MEDTRONIC);  Surgeon: Bluford Standing, MD;  Location: [facility] ORS;  Service: Neurosurgery;  Laterality: N/A;   VASECTOMY      Allergies: Allergies as of 03/07/2024 - Review Complete 03/07/2024  Allergen Reaction Noted   Ace inhibitors Other (See Comments) 08/03/2012   Colchicine  Other (See Comments) 07/17/2019    Lisinopril Cough 12/02/2017    Medications: Outpatient Encounter Medications as of 03/07/2024  Medication Sig   acetaminophen  (TYLENOL ) 500 MG tablet Take 1,000 mg by mouth every 8 (eight) hours as needed for mild pain or moderate pain. Takes once a day   aspirin  EC 81 MG tablet Take 81 mg by mouth daily. Swallow whole.   atorvastatin  (LIPITOR) 80 MG tablet Take 1 tablet (80 mg total) by mouth daily.   bisacodyl  (DULCOLAX) 5 MG EC tablet Take 5 mg by mouth daily as needed.   Blood Glucose Monitoring Suppl (FREESTYLE LITE) DEVI To check blood sugar once daily   carvedilol  (COREG ) 12.5 MG tablet Take 1 tablet (12.5 mg total) by mouth 2 (two) times daily with a meal.   Cholecalciferol (VITAMIN D3) 50 MCG (2000 UT) capsule Take 2,000 Units by mouth daily.   clobetasol  (TEMOVATE ) 0.05 % external solution Apply 1 Application topically 2 (two)  times daily.   cyanocobalamin  (VITAMIN B12) 1000 MCG tablet Take 1,000 mcg by mouth daily.   cyclobenzaprine  (FLEXERIL ) 10 MG tablet Take 1 tablet (10 mg total) by mouth 3 (three) times daily as needed for muscle spasms.   diazepam  (VALIUM ) 2 MG tablet Take 2 mg by mouth every 8 (eight) hours as needed.   ezetimibe  (ZETIA ) 10 MG tablet TAKE 1 TABLET DAILY   FREESTYLE LITE test strip USE TO CHECK BLOOD SUGAR ONCE DAILY   gemfibrozil  (LOPID ) 600 MG tablet Take 1 tablet (600 mg total) by mouth 2 (two) times daily before a meal.   indomethacin  (INDOCIN ) 50 MG capsule Take 1 capsule (50 mg total) by mouth daily as needed (gout flare). Taking once daily   isosorbide  mononitrate (IMDUR ) 60 MG 24 hr tablet TAKE 1 TABLET DAILY   JARDIANCE  10 MG TABS tablet TAKE 1 TABLET DAILY BEFORE BREAKFAST   ketoconazole  (NIZORAL ) 2 % shampoo Apply topically 2 (two) times a week.   Lancets (FREESTYLE) lancets USE TO CHECK BLOOD SUGAR ONCE DAILY   metFORMIN  (GLUCOPHAGE -XR) 500 MG 24 hr tablet TAKE 2 TABLETS TWICE A DAY WITH MEALS (NEED FOLLOW UP APPOINTMENT FOR UPDATED LABS AND  FURTHER REFILLS)   Multiple Vitamin (MULTIVITAMIN WITH MINERALS) TABS tablet Take 1 tablet by mouth daily.   nitroGLYCERIN  (NITROSTAT ) 0.4 MG SL tablet DISSOLVE 1 TABLET UNDER THE TONGUE EVERY 5 MINUTES AS NEEDED FOR CHEST PAIN   OZEMPIC , 0.25 OR 0.5 MG/DOSE, 2 MG/3ML SOPN INJECT 0.5 MG UNDER THE SKIN ONCE A WEEK   pantoprazole  (PROTONIX ) 40 MG tablet TAKE 1 TABLET DAILY   potassium chloride  (KLOR-CON ) 10 MEQ tablet TAKE 2 TABLETS TWICE A DAY   pregabalin  (LYRICA ) 100 MG capsule Take 100 mg by mouth 3 (three) times daily.   Pyrithione Zinc (DERMAZINC SHAMPOO) 2 % SHAM Apply 1 Dose topically as needed.   sacubitril-valsartan (ENTRESTO ) 49-51 MG Take 1 tablet by mouth 2 (two) times daily.   tamsulosin  (FLOMAX ) 0.4 MG CAPS capsule Take 1 capsule (0.4 mg total) by mouth daily.   torsemide  (DEMADEX ) 20 MG tablet Take 2 tablets (40 mg total) by mouth every other day.   No facility-administered encounter medications on file as of 03/07/2024.    Social History: Social History   Tobacco Use   Smoking status: Former    Current packs/day: 0.00    Average packs/day: 0.3 packs/day for 2.0 years (0.5 ttl pk-yrs)    Types: Cigarettes, Pipe, Cigars    Start date: 05/27/2003    Quit date: 05/26/2005    Years since quitting: 18.7   Smokeless tobacco: Former    Types: Associate Professor status: Never Used  Substance Use Topics   Alcohol use: No   Drug use: No    Family Medical History: Family History  Problem Relation Age of Onset   Arthritis Mother    Hyperlipidemia Father    Hypertension Father    Heart attack Father 50   Heart disease Father    Heart murmur Brother    Diabetes Brother    Valvular heart disease Brother    Obesity Brother    Cataracts Maternal Grandmother    Glaucoma Maternal Grandmother    Cancer Maternal Grandfather    Heart attack Paternal Grandfather    Hypertension Paternal Grandfather    Prostate cancer Neg Hx    Bladder Cancer Neg Hx    Kidney cancer  Neg Hx     Physical Examination: Vitals:  03/07/24 0808  BP: 136/82   Awake, alert, oriented to person, place, and time.  Speech is clear and fluent. Fund of knowledge is appropriate.   Cranial Nerves: Pupils equal round and reactive to light.  Facial tone is symmetric.    No abnormal lesions on exposed skin.   Strength:  Side Iliopsoas Quads Hamstring PF DF EHL  R 5 5 5 5 5 5   L 5 5 5 5 3 3    Reflexes are 2+ and symmetric at the biceps, brachioradialis, patella.     Bilateral lower extremity sensation is intact to light touch.     He ambulates with a cane.    Medical Decision Making  Imaging: none   Assessment and Plan: He is s/p SCS by Dr. Bluford 05/26/21. He has known left foot drop. Also with history of ACDF.   He did great after SCS with minimal pain. History of lumbar surgery x 2 with [facility] (see above).   He continues with constant LBP with right lateral leg pain to his foot. He has occasional left leg pain lateral to his foot as well. He has numbness and tingling in his feet- he has known neuropathy.   He is being scheduled for right ankle surgery by podiatry on 03/27/24.   He has known retrolisthesis L3-L4 and spondylolisthesis L4-L5. Slip at L4-L5 is a little worse with flexion. Also with disc at L4-L5 with moderate right/mild left subarticular stenosis and moderate bilateral foraminal stenosis along with left sided disc L5-S1 with moderate left/mild right foraminal stenosis.   Weakness in left foot is chronic.   Treatment options discussed with patient and following plan made:   - Follow up with Dr. Lateef as scheduled.  - He has ankle surgery on 03/27/24. Will hold on further treatment for his back until he recovers. - If no improvement with injections, would need to revisit PT and then discuss possible surgery options.  - I will message him before thanksgiving to check on him and regroup.   I spent a total of 20 minutes in face-to-face and  non-face-to-face activities related to this patient's care today including review of outside records, review of imaging, review of symptoms, physical exam, discussion of differential diagnosis, discussion of treatment options, and documentation.   Glade Boys PA-C Dept. of Neurosurgery

## 2024-03-06 ENCOUNTER — Encounter: Payer: Self-pay | Admitting: Student in an Organized Health Care Education/Training Program

## 2024-03-06 ENCOUNTER — Ambulatory Visit
Admission: RE | Admit: 2024-03-06 | Discharge: 2024-03-06 | Disposition: A | Discharge status: Home / Self Care | Source: Ambulatory Visit | Attending: Student in an Organized Health Care Education/Training Program | Admitting: Student in an Organized Health Care Education/Training Program

## 2024-03-06 ENCOUNTER — Ambulatory Visit (HOSPITAL_BASED_OUTPATIENT_CLINIC_OR_DEPARTMENT_OTHER): Admitting: Student in an Organized Health Care Education/Training Program

## 2024-03-06 ENCOUNTER — Telehealth: Payer: Self-pay

## 2024-03-06 DIAGNOSIS — M47816 Spondylosis without myelopathy or radiculopathy, lumbar region: Secondary | ICD-10-CM | POA: Insufficient documentation

## 2024-03-06 DIAGNOSIS — G894 Chronic pain syndrome: Secondary | ICD-10-CM | POA: Insufficient documentation

## 2024-03-06 MED ORDER — ROPIVACAINE HCL 2 MG/ML IJ SOLN
18.0000 mL | Freq: Once | INTRAMUSCULAR | Status: AC
  Administered 2024-03-06: 18 mL via PERINEURAL
  Filled 2024-03-06: qty 20

## 2024-03-06 MED ORDER — LACTATED RINGERS IV SOLN: Freq: Once | INTRAVENOUS | Status: AC

## 2024-03-06 MED ORDER — PENTAFLUOROPROP-TETRAFLUOROETH EX AERO
INHALATION_SPRAY | Freq: Once | CUTANEOUS | Status: AC
  Administered 2024-03-06: 30 via TOPICAL

## 2024-03-06 MED ORDER — MIDAZOLAM HCL 2 MG/2ML IJ SOLN
0.5000 mg | Freq: Once | INTRAMUSCULAR | Status: AC
  Administered 2024-03-06: 2 mg via INTRAVENOUS
  Filled 2024-03-06: qty 2

## 2024-03-06 MED ORDER — DEXAMETHASONE SODIUM PHOSPHATE 10 MG/ML IJ SOLN
20.0000 mg | Freq: Once | INTRAMUSCULAR | Status: AC
  Administered 2024-03-06: 20 mg
  Filled 2024-03-06: qty 2

## 2024-03-06 NOTE — Progress Notes (Signed)
 PROVIDER NOTE: Interpretation of information contained herein should be left to medically-trained personnel. Specific patient instructions are provided elsewhere under Patient Instructions section of medical record. This document was created in part using STT-dictation technology, any transcriptional errors that may result from this process are unintentional.  Patient: Jonathon Newman Type: Established DOB: 1955/08/04 MRN: [id number] PCP: Wellington Curtis LABOR, FNP  Service: Procedure DOS: 03/06/2024 Setting: Ambulatory Location: Ambulatory outpatient facility Delivery: Face-to-face Provider: Wallie Sherry, MD Specialty: Interventional Pain Management Specialty designation: 09 Location: Outpatient facility Ref. Prov.: Sherry Wallie, MD       Interventional Therapy   Type: Lumbar Facet, Medial Branch Block(s) (w/ fluoroscopic mapping) #1  Laterality: Bilateral  Level: L3, L4, and L5 Medial Branch/Dorsal Rami Level(s). Injecting these levels blocks the L3-4 and L4-5 lumbar facet joints. Imaging: Fluoroscopic guidance Spinal ([id number]) Anesthesia: Local anesthesia (1-2% Lidocaine ) Sedation: Minimal Sedation                       DOS: 03/06/2024 Performed by: Wallie Sherry, MD  Primary Purpose: Diagnostic/Therapeutic Indications: Low back pain severe enough to impact quality of life or function. 1. Lumbar facet arthropathy   2. Lumbar spondylosis   3. Chronic pain syndrome    NAS-11 Pain score:   Pre-procedure: 7 /10   Post-procedure: 5 /10     Position / Prep / Materials:  Position: Prone  Prep solution: ChloraPrep (2% chlorhexidine  gluconate and 70% isopropyl alcohol) Area Prepped: Posterolateral Lumbosacral Spine (Wide prep: From the lower border of the scapula down to the end of the tailbone and from flank to flank.)  Materials:  Tray: Block Needle(s):  Type: Spinal  Gauge (G): 22  Length: 5-in Qty: 3     H&P (Pre-op Assessment):  Jonathon Newman is a 68 y.o. (year old), male  patient, seen today for interventional treatment. 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 Jonathon Newman has a current medication list which includes the following prescription(s): acetaminophen , aspirin  ec, atorvastatin , bisacodyl , freestyle lite, carvedilol , vitamin d3, clobetasol , cyanocobalamin , cyclobenzaprine , diazepam , ezetimibe , freestyle lite, gemfibrozil , indomethacin , isosorbide  mononitrate, jardiance , ketoconazole , freestyle, metformin , multivitamin with minerals, nitroglycerin , ozempic  (0.25 or 0.5 mg/dose), pantoprazole , potassium chloride , pregabalin , dermazinc shampoo, entresto , tamsulosin , and torsemide , and the following Facility-Administered Medications: lactated ringers . His primarily concern today is the Back Pain (Lumbar bilateral right is worse )  Initial Vital Signs:  Pulse/HCG Rate: 65ECG Heart Rate: 73 (nsr) Temp:   Resp: 18 BP: 117/80 SpO2: 97 %  BMI: Estimated body mass index is 31.95 kg/m as calculated from the following:   Height as of 02/09/24: 5' 7 (1.702 m).   Weight as of 02/09/24: 204 lb (92.5 kg).  Risk Assessment: Allergies: Reviewed. He is allergic to ace inhibitors, colchicine , and lisinopril.  Allergy Precautions: None required Coagulopathies: Reviewed. None identified.  Blood-thinner therapy: None at this  time Active Infection(s): Reviewed. None identified. Jonathon Newman is afebrile  Site Confirmation: Jonathon Newman was asked to confirm the procedure and laterality before marking the site Procedure checklist: Completed Consent: Before the procedure and under  the influence of no sedative(s), amnesic(s), or anxiolytics, the patient was informed of the treatment options, risks and possible complications. To fulfill our ethical and legal obligations, as recommended by the American Medical Association's Code of Ethics, I have informed the patient of my clinical impression; the nature and purpose of the treatment or procedure; the risks, benefits, and possible complications of the intervention; the alternatives, including doing nothing; the risk(s) and benefit(s) of the alternative treatment(s) or procedure(s); and the risk(s) and benefit(s) of doing nothing. The patient was provided information about the general risks and possible complications associated with the procedure. These may include, but are not limited to: failure to achieve desired goals, infection, bleeding, organ or nerve damage, allergic reactions, paralysis, and death. In addition, the patient was informed of those risks and complications associated to Spine-related procedures, such as failure to decrease pain; infection (i.e.: Meningitis, epidural or intraspinal abscess); bleeding (i.e.: epidural hematoma, subarachnoid hemorrhage, or any other type of intraspinal or peri-dural bleeding); organ or nerve damage (i.e.: Any type of peripheral nerve, nerve root, or spinal cord injury) with subsequent damage to sensory, motor, and/or autonomic systems, resulting in permanent pain, numbness, and/or weakness of one or several areas of the body; allergic reactions; (i.e.: anaphylactic reaction); and/or death. Furthermore, the patient was informed of those risks and complications associated with the medications. These include, but are not limited to:  allergic reactions (i.e.: anaphylactic or anaphylactoid reaction(s)); adrenal axis suppression; blood sugar elevation that in diabetics may result in ketoacidosis or comma; water  retention that in patients with history of congestive heart failure may result in shortness of breath, pulmonary edema, and decompensation with resultant heart failure; weight gain; swelling or edema; medication-induced neural toxicity; particulate matter embolism and blood vessel occlusion with resultant organ, and/or nervous system infarction; and/or aseptic necrosis of one or more joints. Finally, the patient was informed that Medicine is not an exact science; therefore, there is also the possibility of unforeseen or unpredictable risks and/or possible complications that may result in a catastrophic outcome. The patient indicated having understood very clearly. We have given the patient no guarantees and we have made no promises. Enough time was given to the patient to ask questions, all of which were answered to the patient's satisfaction. Mr. Weekes has indicated that he wanted to continue with the procedure. Attestation: I, the ordering provider, attest that I have discussed with the patient the benefits, risks, side-effects, alternatives, likelihood of achieving goals, and potential problems during recovery for the procedure that I have provided informed consent. Date  Time: 03/06/2024  9:05 AM  Pre-Procedure Preparation:  Monitoring: As per clinic protocol. Respiration, ETCO2, SpO2, BP, heart rate and rhythm monitor placed and checked for adequate function Safety Precautions: Patient was assessed for positional comfort and pressure points before starting the procedure. Time-out: I initiated and conducted the Time-out before starting the procedure, as per protocol. The patient was asked to participate by confirming the accuracy of the Time Out information. Verification of the correct person, site, and procedure were  performed and confirmed by me, the nursing staff, and the patient. Time-out conducted as per Joint Commission's Universal Protocol (UP.01.01.01). Time: 0957 Start Time: 0957 hrs.  Description of Procedure:          Laterality: (see above) Targeted Levels: (see above)  Safety Precautions: Aspiration looking for blood return was conducted prior to all injections. At no point did we inject any substances, as a needle was being advanced. Before injecting, the patient was  told to immediately notify me if he was experiencing any new onset of ringing in the ears, or metallic taste in the mouth. No attempts were made at seeking any paresthesias. Safe injection practices and needle disposal techniques used. Medications properly checked for expiration dates. SDV (single dose vial) medications used. After the completion of the procedure, all disposable equipment used was discarded in the proper designated medical waste containers. Local Anesthesia: Protocol guidelines were followed. The patient was positioned over the fluoroscopy table. The area was prepped in the usual manner. The time-out was completed. The target area was identified using fluoroscopy. A 12-in long, straight, sterile hemostat was used with fluoroscopic guidance to locate the targets for each level blocked. Once located, the skin was marked with an approved surgical skin marker. Once all sites were marked, the skin (epidermis, dermis, and hypodermis), as well as deeper tissues (fat, connective tissue and muscle) were infiltrated with a small amount of a short-acting local anesthetic, loaded on a 10cc syringe with a 25G, 1.5-in  Needle. An appropriate amount of time was allowed for local anesthetics to take effect before proceeding to the next step. Local Anesthetic: Lidocaine  2.0% The unused portion of the local anesthetic was discarded in the proper designated containers. Technical description of process:  Medial Branch  Dorsal Rami Nerve  Block (MBB):  Neuroanatomy note: Each lumbar facet joint receives dual innervation from medial branches arising from the posterior primary rami at the same level and one level above. The target for each lumbar medial branch is the junction of the ipsilateral superior articular and transverse process of the lower vertebral body. (i.e.: The L4-L5 facet joint is innervated by the L4 medial branch [located at L5] and the L3 medial branch [located at L4]. Blocking the L4 Medial Branch is therefore achieved by injecting at the junction of the ipsilateral superior articular and transverse process of the lower vertebral body [L5].).  Exception: The exception to the above rule is the L5-S1 facet joint which has triple innervation requiring the L4 medial branch, as well as the L5 and the S1 Dorsal Rami(s) to be blocked to fully denervate the joint.  Under fluoroscopic guidance, a needle was inserted until contact was made with os over the target area. After negative aspiration, 2mL of the nerve block solution was injected without difficulty or complication. Paresthesia were avoided during injection. The needle(s) were removed intact and without complication.  Once the entire procedure was completed, the treated area was cleaned, making sure to leave some of the prepping solution back to take advantage of its long term bactericidal properties.         Illustration of the posterior view of the lumbar spine and the posterior neural structures. Laminae of L2 through S1 are labeled. DPRL5, dorsal primary ramus of L5; DPRS1, dorsal primary ramus of S1; DPR3, dorsal primary ramus of L3; FJ, facet (zygapophyseal) joint L3-L4; I, inferior articular process of L4; LB1, lateral branch of dorsal primary ramus of L1; IAB, inferior articular branches from L3 medial branch (supplies L4-L5 facet joint); IBP, intermediate branch plexus; MB3, medial branch of dorsal primary ramus of L3; NR3, third lumbar nerve root; S, superior  articular process of L5; SAB, superior articular branches from L4 (supplies L4-5 facet joint also); TP3, transverse process of L3.   Facet Joint Innervation (* possible contribution)  L1-2 T12, L1 (L2*)  Medial Branch  L2-3 L1, L2 (L3*)  L3-4 L2, L3 (L4*)                     L4-5 L3, L4 (L5*)                     L5-S1 L4, L5, S1                        Vitals:   03/06/24 0955 03/06/24 1000 03/06/24 1006 03/06/24 1013  BP: 117/80 117/78 116/80 123/84  Pulse:    65  Resp: 18 17 15 18   SpO2: 97% 97% 96%      End Time: 1005 hrs.  Imaging Guidance (Spinal):         Type of Imaging Technique: Fluoroscopy Guidance (Spinal) Indication(s): Fluoroscopy guidance for needle placement to enhance accuracy in procedures requiring precise needle localization for targeted delivery of medication in or near specific anatomical locations not easily accessible without such real-time imaging assistance. Exposure Time: Please see nurses notes. Contrast: None used. Fluoroscopic Guidance: I was personally present during the use of fluoroscopy. Tunnel Vision Technique used to obtain the best possible view of the target area. Parallax error corrected before commencing the procedure. Direction-depth-direction technique used to introduce the needle under continuous pulsed fluoroscopy. Once target was reached, antero-posterior, oblique, and lateral fluoroscopic projection used confirm needle placement in all planes. Images permanently stored in EMR. Interpretation: No contrast injected. I personally interpreted the imaging intraoperatively. Adequate needle placement confirmed in multiple planes. Permanent images saved into the patient's record.  Post-operative Assessment:  Post-procedure Vital Signs:  Pulse/HCG Rate: 6569 Temp:   Resp: 18 BP: 123/84 SpO2: 96 %  EBL: None  Complications: No immediate post-treatment complications observed by team, or reported by patient.  Note:  The patient tolerated the entire procedure well. A repeat set of vitals were taken after the procedure and the patient was kept under observation following institutional policy, for this type of procedure. Post-procedural neurological assessment was performed, showing return to baseline, prior to discharge. The patient was provided with post-procedure discharge instructions, including a section on how to identify potential problems. Should any problems arise concerning this procedure, the patient was given instructions to immediately contact us , at any time, without hesitation. In any case, we plan to contact the patient by telephone for a follow-up status report regarding this interventional procedure.  Comments:  No additional relevant information.  Plan of Care (POC)  Orders:  Orders Placed This Encounter  Procedures   DG PAIN CLIN[facility] C-ARM 1-60 MIN NO REPORT    Intraoperative interpretation by procedural physician at [facility].    Standing Status:   Standing    Number of Occurrences:   1    Reason for exam::   Assistance in needle guidance and placement for procedures requiring needle placement in or near specific anatomical locations not easily accessible without such assistance.    Medications ordered for procedure: Meds ordered this encounter  Medications   pentafluoroprop-tetrafluoroeth (GEBAUERS) aerosol   lactated ringers  infusion   midazolam  (VERSED ) injection 0.5-2 mg    Make sure Flumazenil is available in the pyxis when using this medication. If oversedation occurs, administer 0.2 mg IV over 15 sec. If after 45 sec no response, administer 0.2 mg again over 1 min; may repeat at 1 min intervals; not to exceed 4 doses (1 mg)   ropivacaine  (PF) 2 mg/mL (0.2%) (NAROPIN ) injection 18 mL   dexamethasone  (DECADRON ) injection 20  mg   Medications administered: We administered pentafluoroprop-tetrafluoroeth, lactated ringers , midazolam , ropivacaine  (PF) 2 mg/mL (0.2%), and  dexamethasone .  See the medical record for exact dosing, route, and time of administration.    Qutenza  No. 1 27 2025 B/L L3,4,5 MBNB 03/06/24     Follow-up plan:   Return in about 3 weeks (around 03/27/2024) for F2F PPE.     Recent Visits Date Type Provider Dept  02/09/24 Procedure visit Marcelino Nurse, MD [facility]  01/27/24 Office Visit Marcelino Nurse, MD [facility]  Showing recent visits within past 90 days and meeting all other requirements Today's Visits Date Type Provider Dept  03/06/24 Procedure visit Marcelino Nurse, MD [facility]  Showing today's visits and meeting all other requirements Future Appointments Date Type Provider Dept  03/28/24 Appointment Marcelino Nurse, MD [facility]  Showing future appointments within next 90 days and meeting all other requirements   Disposition: Discharge home  Discharge (Date  Time): 03/06/2024; 1014 hrs.   Primary Care Physician: Wellington Curtis LABOR, FNP Location: [facility] Note by: Nurse Marcelino, MD (TTS technology used. I apologize for any typographical errors that were not detected and corrected.) Date: 03/06/2024; Time: 10:32 AM  Disclaimer:  Medicine is not an Visual merchandiser. The only guarantee in medicine is that nothing is guaranteed. It is important to note that the decision to proceed with this intervention was based on the information collected from the patient. The Data and conclusions were drawn from the patient's questionnaire, the interview, and the physical examination. Because the information was provided in large part by the patient, it cannot be guaranteed that it has not been purposely or unconsciously manipulated. Every effort has been made to obtain as much relevant data as possible for this evaluation. It is important to note that the conclusions that lead to this procedure are derived in large part from the available data. Always take into account  that the treatment will also be dependent on availability of resources and existing treatment guidelines, considered by other Pain Management Practitioners as being common knowledge and practice, at the time of the intervention. For Medico-Legal purposes, it is also important to point out that variation in procedural techniques and pharmacological choices are the acceptable norm. The indications, contraindications, technique, and results of the above procedure should only be interpreted and judged by a Board-Certified Interventional Pain Specialist with extensive familiarity and expertise in the same exact procedure and technique.

## 2024-03-06 NOTE — Patient Instructions (Signed)
 Post-procedure Information What to expect: Most procedures involve the use of a local anesthetic (numbing medicine), and a steroid (anti-inflammatory medicine).  The local anesthetics may cause temporary numbness and weakness of the legs or arms, depending on the location of the block. This numbness/weakness may last 4-6 hours, depending on the local anesthetic used. In rare instances, it can last up to 24 hours. While numb, you must be very careful not to injure the extremity.  After any procedure, you could expect the pain to get better within 15-20 minutes. This relief is temporary and may last 4-6 hours. Once the local anesthetics wears off, you could experience discomfort, possibly more than usual, for up to 10 (ten) days. In the case of radiofrequencies, it may last up to 6 weeks. Surgeries may take up to 8 weeks for the healing process. The discomfort is due to the irritation caused by needles going through skin and muscle. To minimize the discomfort, we recommend using ice the first day, and heat from then on. The ice should be applied for 15 minutes on, and 15 minutes off. Keep repeating this cycle until bedtime. Avoid applying the ice directly to the skin, to prevent frostbite. Heat should be used daily, until the pain improves (4-10 days). Be careful not to burn yourself.  Occasionally you may experience muscle spasms or cramps. These occur as a consequence of the irritation caused by the needle sticks to the muscle and the blood that will inevitably be lost into the surrounding muscle tissue. Blood tends to be very irritating to tissues, which tend to react by going into spasm. These spasms may start the same day of your procedure, but they may also take days to develop. This late onset type of spasm or cramp is usually caused by electrolyte imbalances triggered by the steroids, at the level of the kidney. Cramps and spasms tend to respond well to muscle relaxants, multivitamins (some are  triggered by the procedure, but may have their origins in vitamin deficiencies), and "Gatorade", or any sports drinks that can replenish any electrolyte imbalances. (If you are a diabetic, ask your pharmacist to get you a sugar-free brand.) Warm showers or baths may also be helpful. Stretching exercises are highly recommended. General Instructions:  Be alert for signs of possible infection: redness, swelling, heat, red streaks, elevated temperature, and/or fever. These typically appear 4 to 6 days after the procedure. Immediately notify your doctor if you experience unusual bleeding, difficulty breathing, or loss of bowel or bladder control. If you experience increased pain, do not increase your pain medicine intake, unless instructed by your pain physician. Post-Procedure Care:  Be careful in moving about. Muscle spasms in the area of the injection may occur. Applying ice or heat to the area is often helpful. The incidence of spinal headaches after epidural injections ranges between 1.4% and 6%. If you develop a headache that does not seem to respond to conservative therapy, please let your physician know. This can be treated with an epidural blood patch.   Post-procedure numbness or redness is to be expected, however it should average 4 to 6 hours. If numbness and weakness of your extremities begins to develop 4 to 6 hours after your procedure, and is felt to be progressing and worsening, immediately contact your physician.   Diet:  If you experience nausea, do not eat until this sensation goes away. If you had a "Stellate Ganglion Block" for upper extremity "Reflex Sympathetic Dystrophy", do not eat or drink until your  hoarseness goes away. In any case, always start with liquids first and if you tolerate them well, then slowly progress to more solid foods. Activity:  For the first 4 to 6 hours after the procedure, use caution in moving about as you may experience numbness and/or weakness. Use caution in  cooking, using household electrical appliances, and climbing steps. If you need to reach your Doctor call our office: [phone] Monday-Thursday 8:00 am - 4:00 PM    Fridays: Closed     In case of an emergency: In case of emergency, call 911 or go to the nearest emergency room and have the physician there call us.  Interpretation of Procedure Every nerve block has two components: a diagnostic component, and a treatment component. Unrealistic expectations are the most common causes of "perceived failure".  In a perfect world, a single nerve block should be able to completely and permanently eliminate the pain. Sadly, the world is not perfect.  Most pain management nerve blocks are performed using local anesthetics and steroids. Steroids are responsible for any long-term benefit that you may experience. Their purpose is to decrease any chronic swelling that may exist in the area. Steroids begin to work immediately after being injected. However, most patients will not experience any benefits until 5 to 10 days after the injection, when the swelling has come down to the point where they can tell a difference. Steroids will only help if there is swelling to be treated. As such, they can assist with the diagnosis. If effective, they suggest an inflammatory component to the pain, and if ineffective, they rule out inflammation as the main cause or component of the problem. If the problem is one of mechanical compression, you will get no benefit from those steroids.   In the case of local anesthetics, they have a crucial role in the diagnosis of your condition. Most will begin to work within15 to 20 minutes after injection. The duration will depend on the type used (short- vs. Long-acting). It is of outmost importance that patients keep tract of their pain, after the procedure. To assist with this matter, a "Post-procedure Pain Diary" is provided. Make sure to complete it and to bring it back to your  follow-up appointment.  As long as the patient keeps accurate, detailed records of their symptoms after every procedure, and returns to have those interpreted, every procedure will provide [place] with invaluable information. Even a block that does not provide the patient with any relief, will always provide [place] with information about the mechanism and the origin of the pain. The only time a nerve block can be considered a waste of time is when patients do not keep track of the results, or do not keep their post-procedure appointment.  Reporting the results back to your physician The Pain Score  Pain is a subjective complaint. It cannot be seen, touched, or measured. We depend entirely on the patient's report of the pain in order to assess your condition and treatment. To evaluate the pain, we use a pain scale, where "0" means "No Pain", and a "10" is "the worst possible pain that you can even imagine" (i.e. something like been eaten alive by a shark or being torn apart by a lion).   You will frequently be asked to rate your pain. Please be as accurate, remember that medical decisions will be based on your responses. Please do not rate your pain above a 10. Doing so is actually interpreted as "symptom magnification" (exaggeration), as  well as lack of understanding with regards to the scale. To put this into perspective, when you tell us that your pain is at a 10 (ten), what you are saying is that there is nothing we can do to make this pain any worse. (Carefully think about that.)

## 2024-03-06 NOTE — Telephone Encounter (Signed)
 Done in error.

## 2024-03-06 NOTE — Progress Notes (Signed)
 Safety precautions to be maintained throughout the outpatient stay will include: orient to surroundings, keep bed in low position, maintain call bell within reach at all times, provide assistance with transfer out of bed and ambulation.

## 2024-03-07 ENCOUNTER — Encounter: Payer: Self-pay | Admitting: Orthopedic Surgery

## 2024-03-07 ENCOUNTER — Telehealth: Payer: Self-pay

## 2024-03-07 ENCOUNTER — Ambulatory Visit (INDEPENDENT_AMBULATORY_CARE_PROVIDER_SITE_OTHER): Admitting: Orthopedic Surgery

## 2024-03-07 VITALS — BP 136/82 | Ht 67.0 in | Wt 212.0 lb

## 2024-03-07 DIAGNOSIS — M47816 Spondylosis without myelopathy or radiculopathy, lumbar region: Secondary | ICD-10-CM

## 2024-03-07 DIAGNOSIS — M48061 Spinal stenosis, lumbar region without neurogenic claudication: Secondary | ICD-10-CM

## 2024-03-07 DIAGNOSIS — M21372 Foot drop, left foot: Secondary | ICD-10-CM

## 2024-03-07 DIAGNOSIS — M4316 Spondylolisthesis, lumbar region: Secondary | ICD-10-CM | POA: Diagnosis not present

## 2024-03-07 DIAGNOSIS — Z9682 Presence of neurostimulator: Secondary | ICD-10-CM | POA: Diagnosis not present

## 2024-03-07 DIAGNOSIS — Z9689 Presence of other specified functional implants: Secondary | ICD-10-CM

## 2024-03-07 DIAGNOSIS — M5416 Radiculopathy, lumbar region: Secondary | ICD-10-CM

## 2024-03-07 NOTE — Telephone Encounter (Signed)
 Post procedure follow up.  LM

## 2024-03-09 ENCOUNTER — Other Ambulatory Visit: Payer: Self-pay | Admitting: *Deleted

## 2024-03-09 MED ORDER — TAMSULOSIN HCL 0.4 MG PO CAPS: 0.4000 mg | ORAL_CAPSULE | Freq: Every day | ORAL | 2 refills | Status: AC

## 2024-03-10 ENCOUNTER — Ambulatory Visit (INDEPENDENT_AMBULATORY_CARE_PROVIDER_SITE_OTHER): Admitting: Family Medicine

## 2024-03-10 ENCOUNTER — Encounter: Payer: Self-pay | Admitting: Family Medicine

## 2024-03-10 VITALS — BP 124/76 | HR 79 | Temp 97.7°F | Resp 16 | Ht 67.0 in | Wt 205.0 lb

## 2024-03-10 DIAGNOSIS — Z7984 Long term (current) use of oral hypoglycemic drugs: Secondary | ICD-10-CM

## 2024-03-10 DIAGNOSIS — E785 Hyperlipidemia, unspecified: Secondary | ICD-10-CM

## 2024-03-10 DIAGNOSIS — E1140 Type 2 diabetes mellitus with diabetic neuropathy, unspecified: Secondary | ICD-10-CM | POA: Diagnosis not present

## 2024-03-10 DIAGNOSIS — R3912 Poor urinary stream: Secondary | ICD-10-CM

## 2024-03-10 DIAGNOSIS — N400 Enlarged prostate without lower urinary tract symptoms: Secondary | ICD-10-CM | POA: Diagnosis not present

## 2024-03-10 DIAGNOSIS — E1159 Type 2 diabetes mellitus with other circulatory complications: Secondary | ICD-10-CM | POA: Diagnosis not present

## 2024-03-10 DIAGNOSIS — I509 Heart failure, unspecified: Secondary | ICD-10-CM

## 2024-03-10 DIAGNOSIS — E1151 Type 2 diabetes mellitus with diabetic peripheral angiopathy without gangrene: Secondary | ICD-10-CM | POA: Diagnosis not present

## 2024-03-10 DIAGNOSIS — I110 Hypertensive heart disease with heart failure: Secondary | ICD-10-CM | POA: Diagnosis not present

## 2024-03-10 DIAGNOSIS — Z01818 Encounter for other preprocedural examination: Secondary | ICD-10-CM | POA: Diagnosis not present

## 2024-03-10 DIAGNOSIS — N401 Enlarged prostate with lower urinary tract symptoms: Secondary | ICD-10-CM

## 2024-03-10 DIAGNOSIS — E1169 Type 2 diabetes mellitus with other specified complication: Secondary | ICD-10-CM

## 2024-03-10 DIAGNOSIS — K219 Gastro-esophageal reflux disease without esophagitis: Secondary | ICD-10-CM

## 2024-03-10 DIAGNOSIS — I152 Hypertension secondary to endocrine disorders: Secondary | ICD-10-CM

## 2024-03-10 DIAGNOSIS — K2100 Gastro-esophageal reflux disease with esophagitis, without bleeding: Secondary | ICD-10-CM

## 2024-03-10 NOTE — Progress Notes (Signed)
 Established Patient Office Visit  Introduced to nurse practitioner role and practice setting.  All questions answered.  Discussed provider/patient relationship and expectations.  Subjective   Patient ID: Jonathon Newman, male    DOB: 07-29-1955  Age: 68 y.o. MRN: [id number]  Chief Complaint  Patient presents with   surgical clearance    Patient is present for surgical clearance needed for foot surgery scheduled on 03/27/2024.     Discussed the use of AI scribe software for clinical note transcription with the patient, who gave verbal consent to proceed.  History of Present Illness Jonathon Newman is a 68 year old male with diabetes and hypertension who presents for a pre-operative evaluation for foot surgery.  He is scheduled for foot surgery on October 13th and is concerned about how his diabetes might affect the healing process, due for check today. He is not supposed to bear weight on the foot post-procedure.  He has a history of elevated PSA and is currently taking Tamsulosin  as prescribed by urology. He is scheduled to follow up with urology in about four months.       03/06/2024    9:21 AM 02/09/2024    8:05 AM 01/27/2024    1:20 PM  Depression screen PHQ 2/9  Decreased Interest 0 0 0  Down, Depressed, Hopeless 0 0 0  PHQ - 2 Score 0 0 0       10/12/2023    8:30 AM 03/15/2023    1:13 PM 04/07/2022    9:41 AM 02/10/2022    9:04 AM  GAD 7 : Generalized Anxiety Score  Nervous, Anxious, on Edge 0 0 0 0  Control/stop worrying 0 0 0 0  Worry too much - different things 0 0 0 0  Trouble relaxing 1 0 0 0  Restless 1 2 0 0  Easily annoyed or irritable 0 0 0 0  Afraid - awful might happen 0 0 0 0  Total GAD 7 Score 2 2 0 0  Anxiety Difficulty Somewhat difficult Somewhat difficult Not difficult at all Not difficult at all     ROS  Negative unless indicated in HPI   Objective:     BP 124/76 (BP Location: Left Arm, Patient Position: Sitting, Cuff Size: Normal)   Pulse  79   Temp 97.7 F (36.5 C) (Oral)   Resp 16   Ht 5' 7 (1.702 m)   Wt 205 lb (93 kg)   SpO2 97%   BMI 32.11 kg/m    Physical Exam Constitutional:      General: He is not in acute distress.    Appearance: Normal appearance. He is normal weight. He is not ill-appearing, toxic-appearing or diaphoretic.  HENT:     Head: Normocephalic.     Right Ear: Tympanic membrane normal.     Left Ear: Tympanic membrane normal.     Nose: Nose normal.     Mouth/Throat:     Mouth: Mucous membranes are moist.     Pharynx: Oropharynx is clear. No oropharyngeal exudate or posterior oropharyngeal erythema.  Eyes:     General: Lids are normal.     Extraocular Movements: Extraocular movements intact.     Right eye: Normal extraocular motion.     Left eye: Normal extraocular motion.     Conjunctiva/sclera: Conjunctivae normal.     Right eye: Right conjunctiva is not injected.     Left eye: Left conjunctiva is not injected.     Pupils: Pupils are equal, round, and  reactive to light.  Neck:     Thyroid: No thyroid mass, thyromegaly or thyroid tenderness.     Vascular: No carotid bruit.  Cardiovascular:     Rate and Rhythm: Normal rate and regular rhythm.     Pulses: Normal pulses.          Radial pulses are 2+ on the right side and 2+ on the left side.       Posterior tibial pulses are 2+ on the right side and 2+ on the left side.     Heart sounds: Normal heart sounds, S1 normal and S2 normal. No murmur heard.    No friction rub. No gallop.  Pulmonary:     Effort: Pulmonary effort is normal. No respiratory distress.     Breath sounds: Normal breath sounds. No stridor. No wheezing, rhonchi or rales.  Abdominal:     General: Bowel sounds are normal. There is no distension.     Palpations: Abdomen is soft. There is no mass.     Tenderness: There is no abdominal tenderness. There is no right CVA tenderness, left CVA tenderness, guarding or rebound.     Hernia: No hernia is present.  Musculoskeletal:         General: No swelling, tenderness, deformity or signs of injury. Normal range of motion.     Cervical back: Normal range of motion. No rigidity.     Right lower leg: No edema.     Left lower leg: No edema.  Lymphadenopathy:     Cervical: No cervical adenopathy.     Right cervical: No superficial, deep or posterior cervical adenopathy.    Left cervical: No superficial, deep or posterior cervical adenopathy.  Skin:    General: Skin is warm and dry.     Capillary Refill: Capillary refill takes less than 2 seconds.     Findings: No bruising or erythema.  Neurological:     General: No focal deficit present.     Mental Status: He is alert and oriented to person, place, and time. Mental status is at baseline.     GCS: GCS eye subscore is 4. GCS verbal subscore is 5. GCS motor subscore is 6.     Cranial Nerves: No cranial nerve deficit.     Sensory: No sensory deficit.     Motor: No weakness, tremor or pronator drift.     Coordination: Romberg sign negative. Coordination normal.     Gait: Gait is intact. Gait normal.  Psychiatric:        Attention and Perception: Attention and perception normal.        Mood and Affect: Mood and affect normal.        Speech: Speech normal.        Behavior: Behavior normal. Behavior is cooperative.        Thought Content: Thought content normal.        Cognition and Memory: Cognition and memory normal.        Judgment: Judgment normal.      No results found for any visits on 03/10/24.    The ASCVD Risk score (Arnett DK, et al., 2019) failed to calculate for the following reasons:   Risk score cannot be calculated because patient has a medical history suggesting prior/existing ASCVD    Assessment & Plan:  There are no diagnoses linked to this encounter.   Assessment and Plan Assessment & Plan Right Foot Surgery; upcoming - Scheduled 03/27/24 - Physical exam today for surgery clearance -  Form filled out for patient, and faxed back to  podiatry office  Type 2 diabetes mellitus Chronic, Well controlled A1c = 5.9% Continue Jardiance  10mg  daily Continue Metformin  1000mg  BID Ozempic  0.5mg  weekly On Statin On Entresto  On lyrica  - for neuropathy Due to eye exam - pt to schedule Pt sees Podiatry - Advise to monitor for signs of infection post-surgery and seek medical attention if needed  Primary Hypertension - associated with CHF, mgmt by Cards - Entresto  - coreg  - imdur  - torsemide    GERD - protonix  - controlled  BPH - tamsulosin  - Following up with urology  HLD  - atorvastatin  80 mg daily - zetia  10mg  daily - Lopid  - lipid panel UTD - mgmt by Cards  Return in about 4 months (around 07/10/2024).   I, Curtis DELENA Boom, FNP, have reviewed all documentation for this visit. The documentation on 03/12/24 for the exam, diagnosis, procedures, and orders are all accurate and complete.   Curtis DELENA Boom, FNP

## 2024-03-12 ENCOUNTER — Encounter: Payer: Self-pay | Admitting: Family Medicine

## 2024-03-12 DIAGNOSIS — Z01818 Encounter for other preprocedural examination: Secondary | ICD-10-CM | POA: Insufficient documentation

## 2024-03-16 ENCOUNTER — Telehealth: Payer: Self-pay | Admitting: Lab

## 2024-03-16 MED ORDER — IBUPROFEN 800 MG PO TABS: 800.0000 mg | ORAL_TABLET | Freq: Four times a day (QID) | ORAL | 1 refills | Status: DC | PRN

## 2024-03-16 MED ORDER — OXYCODONE-ACETAMINOPHEN 5-325 MG PO TABS: 1.0000 | ORAL_TABLET | ORAL | 0 refills | Status: AC | PRN

## 2024-03-16 NOTE — Telephone Encounter (Signed)
 Patient calling states would like post op medications ordered as he doesn't use local pharmacies states had been discussed please advise.

## 2024-03-16 NOTE — Addendum Note (Signed)
 Addended by: Evertt Chouinard on: 03/16/2024 01:45 PM   Modules accepted: Orders

## 2024-03-20 ENCOUNTER — Telehealth: Payer: Self-pay

## 2024-03-20 ENCOUNTER — Encounter
Admission: RE | Admit: 2024-03-20 | Discharge: 2024-03-20 | Disposition: A | Discharge status: Home / Self Care | Source: Ambulatory Visit | Attending: Podiatry | Admitting: Podiatry

## 2024-03-20 ENCOUNTER — Telehealth (HOSPITAL_BASED_OUTPATIENT_CLINIC_OR_DEPARTMENT_OTHER): Payer: Self-pay | Admitting: *Deleted

## 2024-03-20 ENCOUNTER — Other Ambulatory Visit: Payer: Self-pay

## 2024-03-20 VITALS — Ht 67.0 in | Wt 204.8 lb

## 2024-03-20 DIAGNOSIS — I2510 Atherosclerotic heart disease of native coronary artery without angina pectoris: Secondary | ICD-10-CM

## 2024-03-20 DIAGNOSIS — Z01812 Encounter for preprocedural laboratory examination: Secondary | ICD-10-CM

## 2024-03-20 DIAGNOSIS — I10 Essential (primary) hypertension: Secondary | ICD-10-CM

## 2024-03-20 DIAGNOSIS — Z01810 Encounter for preprocedural cardiovascular examination: Secondary | ICD-10-CM

## 2024-03-20 DIAGNOSIS — E1100 Type 2 diabetes mellitus with hyperosmolarity without nonketotic hyperglycemic-hyperosmolar coma (NKHHC): Secondary | ICD-10-CM

## 2024-03-20 NOTE — Telephone Encounter (Signed)
   Pre-operative Risk Assessment    Patient Name: Jonathon Newman  DOB: 09/26/1955 MRN: [id number]   Date of last office visit: 12/13/23 ELLOUISE CLASS, FNP Date of next office visit: NONE   Request for Surgical Clearance    Procedure:  REPAIR, TENDON, ACHILLES  Date of Surgery:  Clearance 03/27/24                                Surgeon:  DR. KEVIN PATEL  Surgeon's Group or Practice Name:  [facility] Phone number:  [phone] DORISE PEREYRA, FNP Fax number:  PER FORM WILL FOLLOW UP IN [facility]   Type of Clearance Requested:   - Medical ; PER FORM THE PT CAN REMAIN ON [facility]    Type of Anesthesia:  SEDATION  & REGIONAL    Additional requests/questions:    [name]   03/20/2024, 4:00 PM

## 2024-03-20 NOTE — Telephone Encounter (Signed)
 Preop tele appt now scheduled, med rec and consent done.

## 2024-03-20 NOTE — Patient Instructions (Addendum)
 Your procedure is scheduled on:  MONDAY OCTOBER 13  Report to the Registration Desk on the 1st floor of the [facility] To find out your arrival time, please call [phone] between 1PM - 3PM on:  FRIDAY OCTOBER 10  If your arrival time is 6:00 am, do not arrive before that time as the [facility] entrance doors do not open until 6:00 am.  REMEMBER: Instructions that are not followed completely may result in serious medical risk, up to and including death; or upon the discretion of your surgeon and anesthesiologist your surgery may need to be rescheduled.  Do not eat food after midnight the night before surgery.  No gum chewing or hard candies.   One week prior to surgery: Stop Anti-inflammatories (NSAIDS) such as Advil, Aleve, Ibuprofen, Motrin, Naproxen, Naprosyn and Aspirin  based products such as Excedrin, Goody's Powder, BC Powder. Stop ANY OVER THE COUNTER supplements until after surgery. Cholecalciferol (VITAMIN D3)  cyanocobalamin  (VITAMIN B12)  ibuprofen (ADVIL)  Multiple Vitamin (MULTIVITAMIN WITH MINERALS)   You may however, continue to take Tylenol  if needed for pain up until the day of surgery.  **Follow guidelines for insulin and diabetes medications.** JARDIANCE  hold 3 days, last dose THURSDAY OCTOBER 9  metFORMIN  (GLUCOPHAGE -XR) hold 2 days WEDNESDAY OCTOBER 8  OZEMPIC  hold 7 days last dose SUNDAY OCTOBER 5    Continue taking all of your other prescription medications up until the day of surgery.  ON THE DAY OF SURGERY ONLY TAKE THESE MEDICATIONS WITH SIPS OF WATER :  pregabalin  (LYRICA )   No Alcohol for 24 hours before or after surgery.   Do not use any recreational drugs for at least a week (preferably 2 weeks) before your surgery.  Please be advised that the combination of cocaine and anesthesia may have negative outcomes, up to and including death. If you test positive for cocaine, your surgery will be cancelled.  On the morning of surgery brush  your teeth with toothpaste and water , you may rinse your mouth with mouthwash if you wish. Do not swallow any toothpaste or mouthwash.  Use CHG Soap as directed on instruction sheet.  Do not wear jewelry, make-up, hairpins, clips or nail polish.  For welded (permanent) jewelry: bracelets, anklets, waist bands, etc.  Please have this removed prior to surgery.  If it is not removed, there is a chance that hospital personnel will need to cut it off on the day of surgery.  Do not wear lotions, powders, or perfumes.   Do not shave body hair from the neck down 48 hours before surgery.  Contact lenses, hearing aids and dentures may not be worn into surgery.  Do not bring valuables to the hospital. [facility] is not responsible for any missing/lost belongings or valuables.   Notify your doctor if there is any change in your medical condition (cold, fever, infection).  Wear comfortable clothing (specific to your surgery type) to the hospital.  After surgery, you can help prevent lung complications by doing breathing exercises.  Take deep breaths and cough every 1-2 hours.   If you are being discharged the day of surgery, you will not be allowed to drive home. You will need a responsible individual to drive you home and stay with you for 24 hours after surgery.   If you are taking public transportation, you will need to have a responsible individual with you.  Please call the Pre-admissions Testing Dept. at [phone] if you have any questions about these instructions.  Surgery  Visitation Policy:  Patients having surgery or a procedure may have two visitors.  Children under the age of 28 must have an adult with them who is not the patient.  Merchandiser, retail to address health-related social needs:  [email or web address]                                                                                                                  Preparing for Surgery  with CHLORHEXIDINE  GLUCONATE (CHG) Soap  Chlorhexidine  Gluconate (CHG) Soap  o An antiseptic cleaner that kills germs and bonds with the skin to continue killing germs even after washing  o Used for showering the night before surgery and morning of surgery  Before surgery, you can play an important role by reducing the number of germs on your skin.  CHG (Chlorhexidine  gluconate) soap is an antiseptic cleanser which kills germs and bonds with the skin to continue killing germs even after washing.  Please do not use if you have an allergy to CHG or antibacterial soaps. If your skin becomes reddened/irritated stop using the CHG.  1. Shower the NIGHT BEFORE SURGERY with CHG soap.  2. If you choose to wash your hair, wash your hair first as usual with your normal shampoo.  3. After shampooing, rinse your hair and body thoroughly to remove the shampoo.  4. Use CHG as you would any other liquid soap. You can apply CHG directly to the skin and wash gently with a clean washcloth.  5. Apply the CHG soap to your body only from the neck down. Do not use on open wounds or open sores. Avoid contact with your eyes, ears, mouth, and genitals (private parts). Wash face and genitals (private parts) with your normal soap.  6. Wash thoroughly, paying special attention to the area where your surgery will be performed.  7. Thoroughly rinse your body with warm water .  8. Do not shower/wash with your normal soap after using and rinsing off the CHG soap.  9. Do not use lotions, oils, etc., after showering with CHG.  10. Pat yourself dry with a clean towel.  11. Wear clean pajamas to bed the night before surgery.  12. Place clean sheets on your bed the night of your shower and do not sleep with pets.  13. Do not apply any deodorants/lotions/powders.  14. Please wear clean clothes to the hospital.  15. Remember to brush your teeth with your regular toothpaste.

## 2024-03-20 NOTE — Telephone Encounter (Signed)
-----   Message from Dorise CHARLENA Pereyra sent at 03/20/2024  3:53 PM EDT ----- Regarding: Request for pre-operative cardiac clearance Request for pre-operative cardiac clearance:  1. What type of surgery is being performed?  REPAIR, TENDON, ACHILLES  2. When is this surgery scheduled?  03/27/2024  3. Type of clearance being requested (medical, pharmacy, both)? MEDICAL   4. Are there any medications that need to be held prior to surgery? N/A - patient to continue daily LOW DOSE ASPIRIN  throughout the perioperative course  5. Practice name and name of physician performing surgery?  Performing surgeon: Dr. Franky Blanch, DPM Requesting clearance: Dorise Pereyra, FNP-C    6. Anesthesia type (none, local, MAC, general)? SEDATION + REGIONAL  7. What is the office phone and fax number?   Phone: [phone] Fax: Return FAX not required. I will follow up in [facility].  ATTENTION: Unable to create telephone message as per your standard workflow. Directed by [facility] providers to send requests for cardiac clearance to this pool for appropriate distribution to provider covering pre-operative clearances.   Dorise Pereyra, MSN, APRN, FNP-C, [facility]  Peri-operative Services Nurse Practitioner Phone: [phone] 03/20/24 3:53 PM ----- Message ----- From: Pereyra Dorise BRAVO, NP Sent: 03/20/2024   3:55 PM EDT To: [facility] Preop Callback Subject: Request for pre-operative cardiac clearance    Request for pre-operative cardiac clearance:  1. What type of surgery is being performed?  REPAIR, TENDON, ACHILLES  2. When is this surgery scheduled?  03/27/2024  3. Type of clearance being requested (medical, pharmacy, both)? MEDICAL   4. Are there any medications that need to be held prior to surgery? N/A - patient to continue daily LOW DOSE ASPIRIN  throughout the perioperative course  5. Practice name and name of physician performing surgery?  Performing surgeon: Dr. Earnestine Blanch,  MD Requesting clearance: Dorise Pereyra, FNP-C    6. Anesthesia type (none, local, MAC, general)? SEDATION + REGIONAL  7. What is the office phone and fax number?   Phone: [phone] Fax: Return FAX not required. I will follow up in [facility].  ATTENTION: Unable to create telephone message as per your standard workflow. Directed by [facility] providers to send requests for cardiac clearance to this pool for appropriate distribution to provider covering pre-operative clearances.   Dorise Pereyra, MSN, APRN, FNP-C, [facility]  Peri-operative Services Nurse Practitioner Phone: [phone] 03/20/24 3:53 PM

## 2024-03-20 NOTE — Telephone Encounter (Signed)
  Patient Consent for Virtual Visit        Jonathon Newman has provided verbal consent on 03/20/2024 for a virtual visit (video or telephone).   CONSENT FOR VIRTUAL VISIT FOR:  Jonathon Newman  By participating in this virtual visit I agree to the following:  I hereby voluntarily request, consent and authorize [facility] and its employed or contracted physicians, physician assistants, nurse practitioners or other licensed health care professionals (the Practitioner), to provide me with telemedicine health care services (the "Services) as deemed necessary by the treating Practitioner. I acknowledge and consent to receive the Services by the Practitioner via telemedicine. I understand that the telemedicine visit will involve communicating with the Practitioner through live audiovisual communication technology and the disclosure of certain medical information by electronic transmission. I acknowledge that I have been given the opportunity to request an in-person assessment or other available alternative prior to the telemedicine visit and am voluntarily participating in the telemedicine visit.  I understand that I have the right to withhold or withdraw my consent to the use of telemedicine in the course of my care at any time, without affecting my right to future care or treatment, and that the Practitioner or I may terminate the telemedicine visit at any time. I understand that I have the right to inspect all information obtained and/or recorded in the course of the telemedicine visit and may receive copies of available information for a reasonable fee.  I understand that some of the potential risks of receiving the Services via telemedicine include:  Delay or interruption in medical evaluation due to technological equipment failure or disruption; Information transmitted may not be sufficient (e.g. poor resolution of images) to allow for appropriate medical decision making by the  Practitioner; and/or  In rare instances, security protocols could fail, causing a breach of personal health information.  Furthermore, I acknowledge that it is my responsibility to provide information about my medical history, conditions and care that is complete and accurate to the best of my ability. I acknowledge that Practitioner's advice, recommendations, and/or decision may be based on factors not within their control, such as incomplete or inaccurate data provided by me or distortions of diagnostic images or specimens that may result from electronic transmissions. I understand that the practice of medicine is not an exact science and that Practitioner makes no warranties or guarantees regarding treatment outcomes. I acknowledge that a copy of this consent can be made available to me via my patient portal [facility] MyChart), or I can request a printed copy by calling the office of [facility].    I understand that my insurance will be billed for this visit.   I have read or had this consent read to me. I understand the contents of this consent, which adequately explains the benefits and risks of the Services being provided via telemedicine.  I have been provided ample opportunity to ask questions regarding this consent and the Services and have had my questions answered to my satisfaction. I give my informed consent for the services to be provided through the use of telemedicine in my medical care

## 2024-03-20 NOTE — Telephone Encounter (Signed)
 1st attempt to reach pt regarding surgical clearance and the need for an TELE appointment.  Left pt a detailed message to call back and get that scheduled.

## 2024-03-20 NOTE — Telephone Encounter (Signed)
   Name: Jonathon Newman  DOB: 05-19-1956  MRN: [id number]  Primary Cardiologist: Evalene Lunger, MD   Preoperative team, please contact this patient and set up a phone call appointment for further preoperative risk assessment. Please obtain consent and complete medication review. Thank you for your help.  I confirm that guidance regarding antiplatelet and oral anticoagulation therapy has been completed and, if necessary, noted below.  None requested.   I also confirmed the patient resides in the state of [place] . As per [facility] Medical Board telemedicine laws, the patient must reside in the state in which the provider is licensed.    Barnie Hila, NP 03/20/2024, 4:19 PM [facility]

## 2024-03-21 ENCOUNTER — Encounter: Payer: Self-pay | Admitting: Urgent Care

## 2024-03-21 ENCOUNTER — Encounter
Admission: RE | Admit: 2024-03-21 | Discharge: 2024-03-21 | Disposition: A | Discharge status: Home / Self Care | Source: Ambulatory Visit | Attending: Podiatry | Admitting: Podiatry

## 2024-03-21 DIAGNOSIS — Z01810 Encounter for preprocedural cardiovascular examination: Secondary | ICD-10-CM

## 2024-03-21 DIAGNOSIS — I2510 Atherosclerotic heart disease of native coronary artery without angina pectoris: Secondary | ICD-10-CM | POA: Insufficient documentation

## 2024-03-21 DIAGNOSIS — I10 Essential (primary) hypertension: Secondary | ICD-10-CM | POA: Insufficient documentation

## 2024-03-21 DIAGNOSIS — Z01818 Encounter for other preprocedural examination: Secondary | ICD-10-CM | POA: Diagnosis not present

## 2024-03-21 DIAGNOSIS — R9431 Abnormal electrocardiogram [ECG] [EKG]: Secondary | ICD-10-CM | POA: Diagnosis not present

## 2024-03-21 DIAGNOSIS — Z01812 Encounter for preprocedural laboratory examination: Secondary | ICD-10-CM | POA: Diagnosis present

## 2024-03-21 LAB — CBC
HCT: 45.2 % (ref 39.0–52.0)
Hemoglobin: 15.4 g/dL (ref 13.0–17.0)
MCH: 32.1 pg (ref 26.0–34.0)
MCHC: 34.1 g/dL (ref 30.0–36.0)
MCV: 94.2 fL (ref 80.0–100.0)
Platelets: 204 K/uL (ref 150–400)
RBC: 4.8 MIL/uL (ref 4.22–5.81)
RDW: 13.6 % (ref 11.5–15.5)
WBC: 6.3 K/uL (ref 4.0–10.5)
nRBC: 0 % (ref 0.0–0.2)

## 2024-03-22 ENCOUNTER — Ambulatory Visit: Attending: Cardiology

## 2024-03-22 DIAGNOSIS — Z01810 Encounter for preprocedural cardiovascular examination: Secondary | ICD-10-CM

## 2024-03-22 NOTE — Progress Notes (Signed)
 Virtual Visit via Telephone Note   Because of Jonathon Newman co-morbid illnesses, he is at least at moderate risk for complications without adequate follow up.  This format is felt to be most appropriate for this patient at this time.  Due to technical limitations with video connection (technology), today's appointment will be conducted as an audio only telehealth visit, and Jonathon Newman verbally agreed to proceed in this manner.   All issues noted in this document were discussed and addressed.  No physical exam could be performed with this format.  Evaluation Performed:  Preoperative cardiovascular risk assessment _____________   Date:  03/22/2024   Patient ID:  Jonathon Newman, DOB [birth year], MRN [id number] Patient Location:  Home Provider location:   Office  Primary Care Provider:  Wellington Newman LABOR, FNP Primary Cardiologist:  Jonathon Lunger, MD  Chief Complaint / Patient Profile   68 y.o. y/o male with a h/o chronic diastolic CHF, CAD, HLD who is pending Achilles tendon repair and presents today for telephonic preoperative cardiovascular risk assessment.  History of Present Illness    Jonathon Newman is a 68 y.o. male who presents via audio/video conferencing for a telehealth visit today.  Pt was last seen in cardiology clinic on 08/31/2023 by Jonathon Newman.  At that time Jonathon Newman was doing well .  The patient is now pending procedure as outlined above. Since his last visit, he remains stable from a cardiac standpoint.  Today he denies chest pain, shortness of breath, lower extremity edema, fatigue, palpitations, melena, hematuria, hemoptysis, diaphoresis, weakness, presyncope, syncope, orthopnea, and PND.   Past Medical History    Past Medical History:  Diagnosis Date   Allergy    Anginal pain    Arthritis    Benign paroxysmal positional vertigo due to bilateral vestibular disorder 07/05/2017   BPH (benign prostatic hyperplasia)    CHF (congestive heart failure)  (HCC)    Chronic pharyngitis 06/01/2022   Chronic radicular lumbar pain 07/09/2017   Combined arterial insufficiency and corporo-venous occlusive erectile dysfunction 12/31/2022   COPD (chronic obstructive pulmonary disease) (HCC)    Coronary artery disease 2010   a.) LHC 2010: high grade RCA stenosis -> 2.5 x 26mm Cypher DES to RCA. b.) NSTEMI 2016 -> PCI revealed pat RCA stent; sig dLM Dz with FFR ratio 0.76 and oLCx stenosis; ref to CVTS. c.) 2v CABG 04/21/2015 (LIMA-LAD, SVG-OM2); d.) [facility] 05/02/2018: EF 55-65%; LVEDP 14-15 mmHg; 10% p-m RCA, 30% pRCA, 20% dRCA, 95% o-pLCx, 80% m-dLM; LIMA graft pat. SVG graft with mild diffuse Dz.   DDD (degenerative disc disease), lumbar    Dyspnea    GERD (gastroesophageal reflux disease)    Hypercholesteremia    Hyperlipidemia associated with type 2 diabetes mellitus (HCC) 12/27/2020   Hypertension    Left foot drop    Neuromuscular disorder (HCC)    NSTEMI (non-ST elevated myocardial infarction) (HCC) 2016   a.) PCI revealed patent RCA stent; significant dLM disease with FFR ratio 0.76 and oLCx stenosis; refer to CVTS for CABG.   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 ENDOSCOPY;  Service: Gastroenterology;  Laterality: N/A;   CORONARY ARTERY BYPASS GRAFT  04/21/2015   CABG x 2 [employer]. LIMA to LAD amd SVG to OM2   CORONARY STENT PLACEMENT  2010   Cordis Cypher Sirolimus-eluting stent 2.50 mm x 26 mm placed to the RCA at [facility]     ESOPHAGEAL MANOMETRY N/A 08/04/2017   Procedure: ESOPHAGEAL MANOMETRY (EM);  Surgeon: Jonathon Corinn Skiff, MD;  Location: [facility] ENDOSCOPY;  Service: Endoscopy;  Laterality: N/A;   ESOPHAGOGASTRODUODENOSCOPY (EGD) WITH PROPOFOL  N/A 06/17/2022   Procedure: ESOPHAGOGASTRODUODENOSCOPY (EGD) WITH PROPOFOL ;  Surgeon: Jonathon Bi, MD;  Location[facility] ENDOSCOPY;  Service: Gastroenterology;  Laterality: N/A;   FRACTURE SURGERY     KNEE SURGERY     KNEE SURGERY     right knee    LAMINECTOMY     plate in neck R6-R5   LEFT HEART CATH AND CORS/GRAFTS ANGIOGRAPHY N/A 05/02/2018   Procedure: CORS/GRAFTS ANGIOGRAPHY;  Surgeon: Jonathon Deatrice LABOR, MD;  Location: [facility] INVASIVE CV LAB;  Service: Cardiovascular;  Laterality: N/A;   neck sx with plate     RIGHT/LEFT HEART CATH AND CORONARY ANGIOGRAPHY Bilateral 05/02/2018   Procedure: LEFT HEART CATH;  Surgeon: Jonathon Deatrice LABOR, MD;  Location: [facility] INVASIVE CV LAB;  Service: Cardiovascular;  Laterality: Bilateral;   SPINE SURGERY     THORACIC LAMINECTOMY FOR SPINAL CORD STIMULATOR N/A 05/26/2021   Procedure: THORACIC SPINAL CORD STIMULATOR AND PULSE GENERATOR PLACEMENT (MEDTRONIC);  Surgeon: Jonathon Standing, MD;  Location: [facility] ORS;  Service: Neurosurgery;  Laterality: N/A;   VASECTOMY      Allergies  Allergies  Allergen Reactions   Ace Inhibitors Other (See Comments)   Colchicine  Other (See Comments)    Palpitations and headache   Lisinopril Cough    Home Medications    Prior to Admission medications   Medication Sig Start Date End Date Taking? Authorizing Provider  acetaminophen  (TYLENOL ) 500 MG tablet Take 1,000 mg by mouth every 8 (eight) hours as needed for moderate pain (pain score 4-6).    [provider]  aspirin  EC 81 MG tablet Take 81 mg by mouth daily. Swallow whole.    [provider]  atorvastatin  (LIPITOR) 80 MG tablet Take 1 tablet (80 mg total) by mouth daily. 12/13/23   Jonathon Ellouise LABOR, FNP  bisacodyl  (DULCOLAX) 5  MG EC tablet Take 5 mg by mouth every other day.    [provider]  Blood Glucose Monitoring Suppl (FREESTYLE LITE) DEVI To check blood sugar once daily 07/20/19   Vivienne Delon HERO, PA-C  carvedilol  (COREG ) 12.5 MG tablet Take 1 tablet (12.5 mg total) by mouth 2 (two) times daily with a meal. 08/31/23   Newman, Jonathon PARAS, MD  Cholecalciferol (VITAMIN D3) 50 MCG (2000 UT) capsule Take 2,000 Units by mouth daily.    [provider]  clobetasol  (TEMOVATE ) 0.05 % external solution Apply 1 Application topically 2 (two) times daily. 11/25/22   Emilio Kelly DASEN, FNP  cyanocobalamin  (VITAMIN B12) 1000 MCG tablet Take 1,000 mcg by mouth daily.    [provider]  cyclobenzaprine  (FLEXERIL ) 10 MG tablet Take 1 tablet (10 mg total) by mouth 3 (three) times daily as needed for muscle spasms. Patient taking differently: Take 10 mg by mouth 3 (three) times daily.  11/10/23   Gasper Nancyann BRAVO, MD  diazepam  (VALIUM ) 2 MG tablet Take 2 mg by mouth in the morning, at noon, in the evening, and at bedtime. 07/28/22   [provider]  ezetimibe  (ZETIA ) 10 MG tablet TAKE 1 TABLET DAILY 05/03/23   Jonathon Ellouise LABOR, FNP  FREESTYLE LITE test strip USE TO CHECK BLOOD SUGAR ONCE DAILY 10/28/22   Emilio Kelly DASEN, FNP  gemfibrozil  (LOPID ) 600 MG tablet Take 1 tablet (600 mg total) by mouth 2 (two) times daily before a meal. 01/24/24   Jonathon Ellouise LABOR, FNP  ibuprofen (ADVIL) 800 MG tablet Take 1 tablet (800 mg total) by mouth every 6 (six) hours as needed. 03/16/24   Tobie Franky SQUIBB, DPM  indomethacin  (INDOCIN ) 50 MG capsule Take 1 capsule (50 mg total) by mouth daily as needed (gout flare). Taking once daily Patient taking differently: Take 50 mg by mouth daily as needed (gout flare). 07/26/23   Gasper Nancyann BRAVO, MD  isosorbide  mononitrate (IMDUR ) 60 MG 24 hr tablet TAKE 1 TABLET DAILY 04/30/23   Newman, Timothy J, MD  JARDIANCE  10 MG TABS tablet TAKE 1 TABLET DAILY BEFORE BREAKFAST 11/02/23   Jonathon Ellouise A, FNP  ketoconazole  (NIZORAL ) 2 % shampoo Apply topically 2 (two) times a week. 07/26/23   Gasper Nancyann BRAVO, MD  Lancets (FREESTYLE) lancets USE TO CHECK BLOOD SUGAR ONCE DAILY 12/23/20   Myrla Jon HERO, MD  metFORMIN  (GLUCOPHAGE -XR) 500 MG 24 hr tablet TAKE 2 TABLETS TWICE A DAY WITH MEALS (NEED FOLLOW UP APPOINTMENT FOR UPDATED LABS AND FURTHER REFILLS) 09/27/23   Jonathon Newman LABOR, FNP  Multiple Vitamin (MULTIVITAMIN WITH MINERALS) TABS tablet Take 1 tablet by mouth daily.    [provider]  nitroGLYCERIN  (NITROSTAT ) 0.4 MG SL tablet DISSOLVE 1 TABLET UNDER THE TONGUE EVERY 5 MINUTES AS NEEDED FOR CHEST PAIN 03/30/23   Jonathon Ellouise LABOR, FNP  oxyCODONE -acetaminophen  (PERCOCET) 5-325 MG tablet Take 1 tablet by mouth every 4 (four) hours as needed for severe pain (pain score 7-10). 03/16/24   Tobie Franky SQUIBB, DPM  OZEMPIC , 0.25 OR 0.5 MG/DOSE, 2 MG/3ML SOPN INJECT 0.5 MG UNDER THE SKIN ONCE A WEEK 12/07/23   Clifton, Kellie A, FNP  pantoprazole  (PROTONIX ) 40 MG tablet TAKE 1 TABLET DAILY 09/28/23   Clifton, Kellie A, FNP  potassium chloride  (KLOR-CON ) 10 MEQ tablet TAKE 2 TABLETS TWICE A DAY 02/26/23   Emilio Kelly DASEN, FNP  pregabalin  (LYRICA ) 100 MG capsule Take 100 mg by mouth 3 (three) times daily.    [provider]  Pyrithione Zinc (DERMAZINC SHAMPOO) 2 % SHAM Apply 1 Dose topically as needed. 12/31/22   Emilio Kelly DASEN, FNP  sacubitril-valsartan (ENTRESTO ) 49-51 MG Take 1 tablet by mouth 2 (two) times daily. 08/31/23   Newman, Timothy J, MD  tamsulosin  (FLOMAX ) 0.4 MG CAPS capsule Take 1 capsule (0.4 mg total) by mouth daily. 03/09/24   Stoioff, Glendia BROCKS, MD  torsemide  (DEMADEX ) 20 MG tablet Take 2 tablets (40 mg total) by mouth every other day. 08/31/23   Newman, Timothy J, MD    Physical Exam    Vital Signs:  Jonathon Newman does not have vital signs available for review today.  Given telephonic nature of communication, physical exam is limited. AAOx3. NAD. Normal affect.   Speech and respirations are unlabored.  Accessory Clinical Findings    None  Assessment & Plan    1.  Preoperative Cardiovascular Risk Assessment: REPAIR, TENDON, ACHILLES   Date of  Surgery:  Clearance 03/27/24                                  Surgeon:  DR. KEVIN PATEL  Surgeon's Group or Practice Name:  [facility] Phone number:  [phone] DORISE PEREYRA, FNP Fax number:  PER FORM WILL FOLLOW UP IN [facility]      Primary Cardiologist: Jonathon Lunger, MD  Chart reviewed as part of pre-operative protocol coverage. Given past medical history and time since last visit, based on ACC/AHA guidelines, Jonathon Newman would be at acceptable risk for the planned procedure without further cardiovascular testing.   His RCRI is low risk, 0.9% risk of a major cardiac event.  He is able to complete greater than 4 METS of physical activity.  Patient was advised that if he develops new symptoms prior to surgery to contact our office to arrange a follow-up appointment.  He verbalized understanding.  I will route this recommendation to the requesting party via Epic fax function and remove from pre-op pool.       Time:   Today, I have spent  5  minutes with the patient with telehealth technology discussing medical history, symptoms, and management plan.  I spent 10 minutes reviewing patient's past cardiac history and cardiac medications.    Josefa CHRISTELLA Beauvais, NP  03/22/2024, 8:22 AM

## 2024-03-24 ENCOUNTER — Encounter: Payer: Self-pay | Admitting: Podiatry

## 2024-03-24 NOTE — H&P (View-Only) (Signed)
 Perioperative / Anesthesia Services  Pre-Admission Testing Clinical Review / Pre-Operative Anesthesia Consult  Date: 03/24/24  PATIENT DEMOGRAPHICS: Name: Jonathon Newman DOB: [birth year] MRN:   [id number]  Note: Available PAT nursing documentation and vital signs have been reviewed. Clinical nursing staff has updated patient's PMH/PSHx, current medication list, and drug allergies/intolerances to ensure complete and comprehensive history available to assist care teams in MDM as it pertains to the aforementioned surgical procedure and anticipated anesthetic course. Extensive review of available clinical information personally performed. Nursing documentation reviewed. [place] PMH and PSHx updated with any diagnoses and/or procedures that I have knowledge of that may have been inadvertently omitted during his intake with the pre-admission testing department's nursing staff.  PLANNED SURGICAL PROCEDURE(S):   Case: [id number] Date/Time: 03/27/24 0915   Procedure: REPAIR, TENDON, ACHILLES (Right) - IV SEDATION   Anesthesia type: Monitor Anesthesia Care   Diagnosis:      Peroneal tendinitis of right lower extremity [M76.71]     Lipoma of right lower extremity [D17.23]   Pre-op diagnosis:      Peroneal tendinitis of right lower extremity     Lipoma of right lower extremity   Location: [facility] OR ROOM 08 / [facility]   Surgeons: Tobie Franky SQUIBB, DPM        CLINICAL DISCUSSION: Jonathon Newman is a 68 y.o. male who is submitted for pre-surgical anesthesia review and clearance prior to him undergoing the above procedure.  Patient is a Former Smoker (0.5 pack years; quit 05/2005). Pertinent PMH includes: CAD (s/p CABG), NSTEMI, anginal pain, HFpEF, PSVT, angina, aortic atherosclerosis, HTN, HLD, T2DM, COPD, OSAH (requires nocturnal PAP therapy), GERD (on daily PPI), BPH, OA, lumbar DDD, postlaminectomy syndrome (s/p SCS placement), LEFT foot drop, RIGHT peroneal tendinitis, BPH.    Patient is followed by cardiology (Gollan, MD). He was last seen in the cardiology clinic on 08/31/2023; notes reviewed. At the time of his clinic visit, patient doing well overall from a cardiovascular perspective.  Patient with chronic exertional dyspnea related to his underlying COPD diagnosis and former smoking history.  Patient reported that respiratory status is stable and at baseline.  Patient denied any chest pain, PND, orthopnea, palpitations, significant peripheral edema, weakness, fatigue, vertiginous symptoms, or presyncope/syncope. Patient with a past medical history significant for cardiovascular diagnoses. Documented physical exam was grossly benign, providing no evidence of acute exacerbation and/or decompensation of the patient's known cardiovascular conditions.  Patient underwent diagnostic left heart catheterization back in 2010 revealing high-grade stenosis of the RCA.  Subsequent PCI was performed placing a 2.5 x 26 mm Cypher DES x1 to the RCA.   Patient suffered an NSTEMI and 2016.  PCI performed revealing patency of the previously placed RCA stent.  Additionally there was significant disease of the distal LM and stenosis of the ostial LCx.  FFR ratio was 0.76.  Patient was referred to CVTS for consideration of CABG procedure.   Patient underwent two-vessel CABG on 04/21/2015.  LIMA-LAD and SVG-OM2 bypass grafts were placed.   Diagnostic right and left heart catheterization was performed on 05/02/2018 revealing a normal left ventricular systolic function with an EF of 55-65%.  LVEDP mildly elevated at 14 to 15 mmHg.  There was multivessel CAD noted; 10% proximal to mid RCA, 30% proximal RCA, 20% distal RCA, 95% ostial to proximal LCx, and 80% mid to distal LM.  LIMA-LAD bypass graft was widely patent.  SVG graft with mild diffuse disease.  No further interventions were pursued opting for medical  management.  Most recent myocardial perfusion imaging study was performed on  02/26/2022 revealing a  normal left ventricular systolic function with an EF of 60 to %.  There were no regional wall motion abnormalities.  No artifact or left ventricular cavity size enlargement appreciated on review of imaging.  CT attenuation correction images with evidence of three-vessel coronary calcium .  SPECT images demonstrated a small region fixed defect in the inferolateral apical region.  Unable to exclude prior MI versus attenuation artifact. TID ratio = 1.06 (normal range </= 1.2). Study determined to be normal and low risk.   Long-term cardiac monitor study performed on 03/05/2022.  Study revealed a predominant underlying normal sinus rhythm at an average rate of 73 bpm; range 48-118 bpm.  There were 2 runs of PSVT with  the longest and fastest run lasting 4 beats at a maximum rate of 118 bpm.  Frequent PACs (115,493) were captured accounting for an 8% study burden.  There was rare ventricular ectopy.  Triggered events associated with normal sinus rhythm and PACs.  There were no significant arrhythmias or prolonged pauses.  Most recent TTE performed on 12/02/2023 revealed a normal left ventricular systolic function with an EF of 60-65%. There were no regional wall motion abnormalities. Left ventricular diastolic Doppler parameters consistent with abnormal relaxation (G1DD).  Right ventricular size and function normal.  TR jet inadequate for assessing PA pressure.  There was mild tricuspid valve regurgitation.  All transvalvular gradients were noted to be normal providing no evidence of hemodynamically significant valvular stenosis. Aorta normal in size with no evidence of ectasia or aneurysmal dilatation.  HFpEF being managed on GDMT interventions including beta-blocker (carvedilol ), nitrate (isosorbide  mononitrate), diuretic (torsemide ), and ARB/ARNI (Entresto ) therapies.  In addition to his scheduled nitrate therapy, patient has a supply of short acting nitrates (NTG) to use on a as needed  basis for recurrent anginal symptoms; denied recent use.  Patient is on atorvastatin  + ezetimibe  for his HLD diagnosis and further ASCVD prevention.  T2DM well-controlled on currently prescribed regimen; last Hgb A1c was 6.1 % when checked on 12/01/2023. In the setting of known cardiovascular diagnoses and concurrent T2DM, patient is taking an SGLT2i (empagliflozin ) for added cardiovascular and renovascular protection.  Patient does have an OSAH diagnosis and is reported to be compliant with prescribed nocturnal PAP therapy.  Patient is able to complete all of his  ADL/IADLs without cardiovascular limitation.  Per the DASI, patient is able to achieve at least 4 METS of physical activity without experiencing any significant degree of angina/anginal equivalent symptoms.No changes were made to his medication regimen.  Patient to follow-up with outpatient cardiology in 12 months or sooner if needed.   Jonathon Newman is scheduled for an elective REPAIR, TENDON, ACHILLES (Right) on 03/27/2024 with Dr. Franky SHAUNNA Blanch, DPM. Given patient's past medical history significant for cardiovascular diagnoses, presurgical cardiac clearance was sought by the PAT team. Per cardiology, RCRI is low risk, 0.9% risk of a major cardiac event.  He is able to complete greater than 4 METS of physical activity. Based ACC/AHA guidelines, the patient's past medical history, and the amount of time since his last clinic visit, this patient would be at an overall ACCEPTABLE risk for the planned procedure without further cardiovascular testing or intervention at this time.   In review of the patient's medication reconciliation, it is noted that he is on daily oral antithrombotic therapy. Given that patient's past medical history is significant for cardiovascular diagnoses, including but not limited to  CAD, podiatry has cleared patient to continue his daily low dose ASA throughout his perioperative course.  Patient has been updated on these  directives from his specialty care providers by the PAT team.  Patient denies previous perioperative complications with anesthesia in the past. In review his EMR, it is noted that patient underwent a general anesthetic course here at [facility] ([facility]) in 10/2022 without documented complications.   MOST RECENT VITAL SIGNS:    03/20/2024    2:28 PM 03/10/2024    2:58 PM 03/10/2024    2:03 PM  Vitals with BMI  Height 5' 7  5' 7  Weight 204 lbs 13 oz  205 lbs  BMI 32.07  32.1  Systolic  124 124  Diastolic  76 113  Pulse   79   PROVIDERS/SPECIALISTS: NOTE: Primary physician provider listed below. Patient may have been seen by APP or partner within same practice.   PROVIDER ROLE / SPECIALTY LAST OV  Tobie Franky SQUIBB, DPM Podiatry (Surgeon) 02/01/2024  Wellington Curtis LABOR, FNP Primary Care Provider 03/10/2024  Perla Lye, MD Cardiology 08/31/2023; preop APP call 03/22/2024  Donette [place], FNP-C Advanced Heart Failure 12/13/2023  Marcelino Nurse, MD Pain Management 03/06/2024  Lane Hussar, MD Neurology 02/21/2024  Dennise Capri, MD Nephrology 10/20/2023   ALLERGIES: Allergies  Allergen Reactions   Ace Inhibitors Other (See Comments)   Colchicine  Other (See Comments)    Palpitations and headache   Lisinopril Cough    CURRENT HOME MEDICATIONS: No current facility-administered medications for this encounter.    acetaminophen  (TYLENOL ) 500 MG tablet   aspirin  EC 81 MG tablet   atorvastatin  (LIPITOR) 80 MG tablet   bisacodyl  (DULCOLAX) 5 MG EC tablet   carvedilol  (COREG ) 12.5 MG tablet   Cholecalciferol (VITAMIN D3) 50 MCG (2000 UT) capsule   clobetasol  (TEMOVATE ) 0.05 % external solution   cyanocobalamin  (VITAMIN B12) 1000 MCG tablet   cyclobenzaprine  (FLEXERIL ) 10 MG tablet   diazepam  (VALIUM ) 2 MG tablet   ezetimibe  (ZETIA ) 10 MG tablet   indomethacin  (INDOCIN ) 50 MG capsule   isosorbide  mononitrate (IMDUR ) 60 MG 24 hr tablet    JARDIANCE  10 MG TABS tablet   ketoconazole  (NIZORAL ) 2 % shampoo   metFORMIN  (GLUCOPHAGE -XR) 500 MG 24 hr tablet   Multiple Vitamin (MULTIVITAMIN WITH MINERALS) TABS tablet   nitroGLYCERIN  (NITROSTAT ) 0.4 MG SL tablet   OZEMPIC , 0.25 OR 0.5 MG/DOSE, 2 MG/3ML SOPN   pantoprazole  (PROTONIX ) 40 MG tablet   potassium chloride  (KLOR-CON ) 10 MEQ tablet   pregabalin  (LYRICA ) 100 MG capsule   sacubitril-valsartan (ENTRESTO ) 49-51 MG   tamsulosin  (FLOMAX ) 0.4 MG CAPS capsule   torsemide  (DEMADEX ) 20 MG tablet   Blood Glucose Monitoring Suppl (FREESTYLE LITE) DEVI   FREESTYLE LITE test strip   gemfibrozil  (LOPID ) 600 MG tablet   ibuprofen (ADVIL) 800 MG tablet   Lancets (FREESTYLE) lancets   oxyCODONE -acetaminophen  (PERCOCET) 5-325 MG tablet   Pyrithione Zinc (DERMAZINC SHAMPOO) 2 % SHAM   HISTORY: Past Medical History:  Diagnosis Date   (HFpEF) heart failure with preserved ejection fraction (HCC)    Allergy    Anginal pain    Anxiety    a.) on BZO (diazepam ) PRN   Aortic atherosclerosis    Arthritis    Benign paroxysmal positional vertigo due to bilateral vestibular disorder 07/05/2017   BPH (benign prostatic hyperplasia)    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 b.) NSTEMI 2016 -> PCI revealed pat RCA stent; sig dLM Dz with FFR ratio 0.76 and oLCx stenosis; ref to CVTS. c.) 2v CABG 04/21/2015 (LIMA-LAD, SVG-OM2); d.) [facility] 05/02/2018: EF 55-65%; LVEDP 14-15 mmHg; 10% p-m RCA, 30% pRCA, 20% dRCA, 95% o-pLCx, 80% m-dLM; LIMA graft pat. SVG graft with mild diffuse Dz.   DDD (degenerative disc disease), lumbar    Dyspnea    GERD (gastroesophageal reflux disease)    Hyperlipidemia associated with type 2  diabetes mellitus (HCC) 12/27/2020   Hypertension    Left foot drop    Long-term use of aspirin  therapy    NSTEMI (non-ST elevated myocardial infarction) (HCC) 2016   a.) PCI revealed patent RCA stent; significant dLM disease with FFR ratio 0.76 and oLCx stenosis; refer to CVTS for CABG.   OSA on CPAP    Peroneal tendinitis of right lower extremity    Postlaminectomy syndrome    a.) s/p SCS placement 05/26/2021   PSVT (paroxysmal supraventricular tachycardia)    S/P CABG x 2 04/21/2015   a.) LIMA-LAD, SVG-OM2   T2DM (type 2 diabetes mellitus) (HCC)    Unstable angina (HCC) 04/25/2018   Past Surgical History:  Procedure Laterality Date   APPENDECTOMY     BACK SURGERY  11/01/2017   SL 5 and S1   CARDIAC CATHETERIZATION     CARPAL TUNNEL RELEASE     left hand   CHOLECYSTECTOMY     CHOLECYSTECTOMY     COLONOSCOPY WITH PROPOFOL  N/A 10/28/2022   Procedure: COLONOSCOPY WITH PROPOFOL ;  Surgeon: Therisa Bi, MD;  Location: [facility] ENDOSCOPY;  Service: Gastroenterology;  Laterality: N/A;   CORONARY ARTERY BYPASS GRAFT  04/21/2015   CABG x 2 [place] [facility] LIMA to LAD amd SVG to OM2   CORONARY STENT PLACEMENT  2010   Cordis Cypher Sirolimus-eluting stent 2.50 mm x 26 mm placed to the RCA at [facility]    ESOPHAGEAL MANOMETRY N/A 08/04/2017   Procedure: ESOPHAGEAL MANOMETRY (EM);  Surgeon: Unk Corinn Skiff, MD;  Location: [facility] ENDOSCOPY;  Service: Endoscopy;  Laterality: N/A;   ESOPHAGOGASTRODUODENOSCOPY (EGD) WITH PROPOFOL  N/A 06/17/2022   Procedure: ESOPHAGOGASTRODUODENOSCOPY (EGD) WITH PROPOFOL ;  Surgeon: Therisa Bi, MD;  Location: [facility] ENDOSCOPY;  Service: Gastroenterology;  Laterality: N/A;   FRACTURE SURGERY     KNEE SURGERY     KNEE SURGERY     right knee    LAMINECTOMY     plate in neck R6-R5   LEFT HEART CATH AND CORS/GRAFTS ANGIOGRAPHY N/A 05/02/2018   Procedure: CORS/GRAFTS ANGIOGRAPHY;  Surgeon: Darron Deatrice LABOR, MD;  Location: [facility] INVASIVE CV LAB;   Service: Cardiovascular;  Laterality: N/A;   neck sx with plate     RIGHT/LEFT HEART CATH AND CORONARY ANGIOGRAPHY Bilateral 05/02/2018   Procedure: LEFT HEART CATH;  Surgeon: Darron Deatrice LABOR, MD;  Location: [facility] INVASIVE CV LAB;  Service: Cardiovascular;  Laterality: Bilateral;   SPINE SURGERY     THORACIC LAMINECTOMY FOR SPINAL CORD STIMULATOR N/A 05/26/2021   Procedure: THORACIC SPINAL CORD STIMULATOR AND PULSE GENERATOR PLACEMENT (MEDTRONIC);  Surgeon: Bluford Standing, MD;  Location: [facility] ORS;  Service: Neurosurgery;  Laterality: N/A;   VASECTOMY     Family History  Problem Relation Age of Onset   Arthritis Mother    Hyperlipidemia Father  Hypertension Father    Heart attack Father 26   Heart disease Father    Heart murmur Brother    Diabetes Brother    Valvular heart disease Brother    Obesity Brother    Cataracts Maternal Grandmother    Glaucoma Maternal Grandmother    Cancer Maternal Grandfather    Heart attack Paternal Grandfather    Hypertension Paternal Grandfather    Prostate cancer Neg Hx    Bladder Cancer Neg Hx    Kidney cancer Neg Hx    Social History   Tobacco Use   Smoking status: Former    Current packs/day: 0.00    Average packs/day: 0.3 packs/day for 2.0 years (0.5 ttl pk-yrs)    Types: Cigarettes, Pipe, Cigars    Start date: 05/27/2003    Quit date: 05/26/2005    Years since quitting: 18.8   Smokeless tobacco: Former    Types: Chew  Substance Use Topics   Alcohol use: No   LABS:  Lab Results  Component Value Date   WBC 6.3 03/21/2024   HGB 15.4 03/21/2024   HCT 45.2 03/21/2024   MCV 94.2 03/21/2024   PLT 204 03/21/2024   Lab Results  Component Value Date   NA 143 01/24/2024   CL 109 01/24/2024   K 4.1 01/24/2024   CO2 24 01/24/2024   BUN 19 01/24/2024   CREATININE 1.24 01/24/2024   GFRNONAA >60 01/24/2024   CALCIUM  9.4 01/24/2024   ALBUMIN 4.7 12/01/2023   GLUCOSE 131 (H) 01/24/2024    ECG: Date: 03/21/2024  Time ECG obtained:  0913 AM Rate: 72 bpm Rhythm: normal sinus Axis (leads I and aVF): normal Intervals: PR 164 ms. QRS 82 ms. QTc 411 ms. ST segment and T wave changes: Nonspecific ST abnormality  Evidence of a possible, age undetermined, prior infarct:  No Comparison: Similar to previous tracing obtained on 08/31/2023   IMAGING / PROCEDURES: TRANSTHORACIC ECHOCARDIOGRAM performed on 12/02/2023 Left ventricular ejection fraction, by estimation, is 60 to 65%. The left ventricle has normal function. The left ventricle has no regional wall motion abnormalities. Left ventricular diastolic parameters are consistent with Grade I diastolic dysfunction (impaired relaxation).  Right ventricular systolic function is normal. The right ventricular size is normal. Tricuspid regurgitation signal is inadequate for assessing PA pressure.  The mitral valve is normal in structure. No evidence of mitral valve regurgitation. No evidence of mitral stenosis.  The aortic valve is tricuspid. Aortic valve regurgitation is not visualized. No aortic stenosis is present.  The inferior vena cava is normal in size with greater than 50% respiratory variability, suggesting right atrial pressure of 3 mmHg.    LONG TERM CARDIAC EVENT MONITOR STUDY performed on 03/05/2022 Patch Wear Time:  13 days and 19 hours (2023-09-05T11:45:01-399 to 2023-09-19T07:43:03-399) Normal sinus rhythm Patient had a min HR of 48 bpm, max HR of 118 bpm, and avg HR of 73 bpm.  2 Supraventricular Tachycardia runs occurred, the run with the fastest interval lasting 4 beats with a max rate of 118 bpm (avg 101 bpm); the run with the fastest interval was also the longest.  Isolated SVEs were frequent (8.0%, [id number]), SVE Couplets were rare (<1.0%, 224), and SVE Triplets were rare (<1.0%, 29). Isolated VEs were rare (<1.0%), and no VE Couplets or VE Triplets were present.  Triggered events associated with normal sinus rhythm, rare PAC.  No significant  arrhythmia  MYOCARDIAL PERFUSION IMAGING STUDY (LEXISCAN ) performed on 02/26/2022 Pharmacological myocardial perfusion imaging study with no significant  ischemia Small region fixed defect in the inferolateral apical region, unable to exclude prior MI versus attenuation artifact GI uptake artifact noted Normal wall motion, EF estimated at 62% No EKG changes concerning for ischemia at peak stress or in recovery. CT attenuation correction images with three-vessel coronary calcification Low risk scan  LONG TERM CARDIAC EVENT MONITOR STUDY performed on 12/15/2018 Predominant underlying normal sinus rhythm with an average heart rate of 72 bpm Minimum heart rate 50 bpm 3 short runs of SVT No significant arrhythmia to explain dizziness   RIGHT/LEFT HEART CATHETERIZATION AND CORONARY ANGIOGRAPHY performed on 05/02/2018 LVEF 55-65% LVEDP mildly elevated at 14-15 mmHg Normal left ventricular systolic function Multivessel CAD 10% proximal to mid RCA 30% proximal RCA 20% distal RCA 95% ostial to proximal LCx 80% mid to distal LM Grafts LIMA-LAD bypass graft patent SVG-OM2 bypass graft exhibited mild diffuse disease Recommendations: Continue medical therapy for coronary artery disease and chronic diastolic heart failure   IMPRESSION AND PLAN: Jonathon Newman has been referred for pre-anesthesia review and clearance prior to him undergoing the planned anesthetic and procedural courses. Available labs, pertinent testing, and imaging results were personally reviewed by me in preparation for upcoming operative/procedural course. [facility] medical record has been updated following extensive record review and patient interview with PAT staff.   This patient has been appropriately cleared by cardiology with an overall ACCEPTABLE risk of patient experiencing significant perioperative cardiovascular complications. Based on clinical review performed today (03/24/24), barring any significant acute  changes in the patient's overall condition, it is anticipated that he will be able to proceed with the planned surgical intervention. Any acute changes in clinical condition may necessitate his procedure being postponed and/or cancelled. Patient will meet with anesthesia team (MD and/or CRNA) on the day of his procedure for preoperative evaluation/assessment. Questions regarding anesthetic course will be fielded at that time.   Pre-surgical instructions were reviewed with the patient during his PAT appointment, and questions were fielded to satisfaction by PAT clinical staff. He has been instructed on which medications that he will need to hold prior to surgery, as well as the ones that have been deemed safe/appropriate to take on the day of his procedure. As part of the general education provided by PAT, patient made aware both verbally and in writing, that he would need to abstain from the use of any illegal substances during his perioperative course. He was advised that failure to follow the provided instructions could necessitate case cancellation or result in serious perioperative complications up to and including death. Patient encouraged to contact PAT and/or his surgeon's office to discuss any questions or concerns that may arise prior to surgery; verbalized understanding.   Dorise Pereyra, MSN, APRN, FNP-C, CEN [facility]  Perioperative Services Nurse Practitioner Phone: [phone] Fax: [phone] 03/24/24 2:25 PM  NOTE: This note has been prepared using Scientist, clinical (histocompatibility and immunogenetics). Despite my best ability to proofread, there is always the potential that unintentional transcriptional errors may still occur from this process.

## 2024-03-24 NOTE — Progress Notes (Signed)
 Perioperative / Anesthesia Services  Pre-Admission Testing Clinical Review / Pre-Operative Anesthesia Consult  Date: 03/24/24  PATIENT DEMOGRAPHICS: Name: Jonathon Newman DOB: [birth year] MRN:   [id number]  Note: Available PAT nursing documentation and vital signs have been reviewed. Clinical nursing staff has updated patient's PMH/PSHx, current medication list, and drug allergies/intolerances to ensure complete and comprehensive history available to assist care teams in MDM as it pertains to the aforementioned surgical procedure and anticipated anesthetic course. Extensive review of available clinical information personally performed. Nursing documentation reviewed. [place] PMH and PSHx updated with any diagnoses and/or procedures that I have knowledge of that may have been inadvertently omitted during his intake with the pre-admission testing department's nursing staff.  PLANNED SURGICAL PROCEDURE(S):   Case: [id number] Date/Time: 03/27/24 0915   Procedure: REPAIR, TENDON, ACHILLES (Right) - IV SEDATION   Anesthesia type: Monitor Anesthesia Care   Diagnosis:      Peroneal tendinitis of right lower extremity [M76.71]     Lipoma of right lower extremity [D17.23]   Pre-op diagnosis:      Peroneal tendinitis of right lower extremity     Lipoma of right lower extremity   Location: [facility] OR ROOM 08 / [facility]   Surgeons: Tobie Franky SQUIBB, DPM        CLINICAL DISCUSSION: Jonathon Newman is a 68 y.o. male who is submitted for pre-surgical anesthesia review and clearance prior to him undergoing the above procedure.  Patient is a Former Smoker (0.5 pack years; quit 05/2005). Pertinent PMH includes: CAD (s/p CABG), NSTEMI, anginal pain, HFpEF, PSVT, angina, aortic atherosclerosis, HTN, HLD, T2DM, COPD, OSAH (requires nocturnal PAP therapy), GERD (on daily PPI), BPH, OA, lumbar DDD, postlaminectomy syndrome (s/p SCS placement), LEFT foot drop, RIGHT peroneal tendinitis, BPH.    Patient is followed by cardiology (Gollan, MD). He was last seen in the cardiology clinic on 08/31/2023; notes reviewed. At the time of his clinic visit, patient doing well overall from a cardiovascular perspective.  Patient with chronic exertional dyspnea related to his underlying COPD diagnosis and former smoking history.  Patient reported that respiratory status is stable and at baseline.  Patient denied any chest pain, PND, orthopnea, palpitations, significant peripheral edema, weakness, fatigue, vertiginous symptoms, or presyncope/syncope. Patient with a past medical history significant for cardiovascular diagnoses. Documented physical exam was grossly benign, providing no evidence of acute exacerbation and/or decompensation of the patient's known cardiovascular conditions.  Patient underwent diagnostic left heart catheterization back in 2010 revealing high-grade stenosis of the RCA.  Subsequent PCI was performed placing a 2.5 x 26 mm Cypher DES x1 to the RCA.   Patient suffered an NSTEMI and 2016.  PCI performed revealing patency of the previously placed RCA stent.  Additionally there was significant disease of the distal LM and stenosis of the ostial LCx.  FFR ratio was 0.76.  Patient was referred to CVTS for consideration of CABG procedure.   Patient underwent two-vessel CABG on 04/21/2015.  LIMA-LAD and SVG-OM2 bypass grafts were placed.   Diagnostic right and left heart catheterization was performed on 05/02/2018 revealing a normal left ventricular systolic function with an EF of 55-65%.  LVEDP mildly elevated at 14 to 15 mmHg.  There was multivessel CAD noted; 10% proximal to mid RCA, 30% proximal RCA, 20% distal RCA, 95% ostial to proximal LCx, and 80% mid to distal LM.  LIMA-LAD bypass graft was widely patent.  SVG graft with mild diffuse disease.  No further interventions were pursued opting for medical  management.  Most recent myocardial perfusion imaging study was performed on  02/26/2022 revealing a  normal left ventricular systolic function with an EF of 60 to %.  There were no regional wall motion abnormalities.  No artifact or left ventricular cavity size enlargement appreciated on review of imaging.  CT attenuation correction images with evidence of three-vessel coronary calcium .  SPECT images demonstrated a small region fixed defect in the inferolateral apical region.  Unable to exclude prior MI versus attenuation artifact. TID ratio = 1.06 (normal range </= 1.2). Study determined to be normal and low risk.   Long-term cardiac monitor study performed on 03/05/2022.  Study revealed a predominant underlying normal sinus rhythm at an average rate of 73 bpm; range 48-118 bpm.  There were 2 runs of PSVT with  the longest and fastest run lasting 4 beats at a maximum rate of 118 bpm.  Frequent PACs (115,493) were captured accounting for an 8% study burden.  There was rare ventricular ectopy.  Triggered events associated with normal sinus rhythm and PACs.  There were no significant arrhythmias or prolonged pauses.  Most recent TTE performed on 12/02/2023 revealed a normal left ventricular systolic function with an EF of 60-65%. There were no regional wall motion abnormalities. Left ventricular diastolic Doppler parameters consistent with abnormal relaxation (G1DD).  Right ventricular size and function normal.  TR jet inadequate for assessing PA pressure.  There was mild tricuspid valve regurgitation.  All transvalvular gradients were noted to be normal providing no evidence of hemodynamically significant valvular stenosis. Aorta normal in size with no evidence of ectasia or aneurysmal dilatation.  HFpEF being managed on GDMT interventions including beta-blocker (carvedilol ), nitrate (isosorbide  mononitrate), diuretic (torsemide ), and ARB/ARNI (Entresto ) therapies.  In addition to his scheduled nitrate therapy, patient has a supply of short acting nitrates (NTG) to use on a as needed  basis for recurrent anginal symptoms; denied recent use.  Patient is on atorvastatin  + ezetimibe  for his HLD diagnosis and further ASCVD prevention.  T2DM well-controlled on currently prescribed regimen; last Hgb A1c was 6.1 % when checked on 12/01/2023. In the setting of known cardiovascular diagnoses and concurrent T2DM, patient is taking an SGLT2i (empagliflozin ) for added cardiovascular and renovascular protection.  Patient does have an OSAH diagnosis and is reported to be compliant with prescribed nocturnal PAP therapy.  Patient is able to complete all of his  ADL/IADLs without cardiovascular limitation.  Per the DASI, patient is able to achieve at least 4 METS of physical activity without experiencing any significant degree of angina/anginal equivalent symptoms.No changes were made to his medication regimen.  Patient to follow-up with outpatient cardiology in 12 months or sooner if needed.   Jonathon Newman is scheduled for an elective REPAIR, TENDON, ACHILLES (Right) on 03/27/2024 with Dr. Franky SHAUNNA Blanch, DPM. Given patient's past medical history significant for cardiovascular diagnoses, presurgical cardiac clearance was sought by the PAT team. Per cardiology, RCRI is low risk, 0.9% risk of a major cardiac event.  He is able to complete greater than 4 METS of physical activity. Based ACC/AHA guidelines, the patient's past medical history, and the amount of time since his last clinic visit, this patient would be at an overall ACCEPTABLE risk for the planned procedure without further cardiovascular testing or intervention at this time.   In review of the patient's medication reconciliation, it is noted that he is on daily oral antithrombotic therapy. Given that patient's past medical history is significant for cardiovascular diagnoses, including but not limited to  CAD, podiatry has cleared patient to continue his daily low dose ASA throughout his perioperative course.  Patient has been updated on these  directives from his specialty care providers by the PAT team.  Patient denies previous perioperative complications with anesthesia in the past. In review his EMR, it is noted that patient underwent a general anesthetic course here at [facility] (ASA III) in 10/2022 without documented complications.   MOST RECENT VITAL SIGNS:    03/20/2024    2:28 PM 03/10/2024    2:58 PM 03/10/2024    2:03 PM  Vitals with BMI  Height 5' 7  5' 7  Weight 204 lbs 13 oz  205 lbs  BMI 32.07  32.1  Systolic  124 124  Diastolic  76 113  Pulse   79   PROVIDERS/SPECIALISTS: NOTE: Primary physician provider listed below. Patient may have been seen by APP or partner within same practice.   PROVIDER ROLE / SPECIALTY LAST OV  Tobie Franky SQUIBB, DPM Podiatry (Surgeon) 02/01/2024  Wellington Curtis LABOR, FNP Primary Care Provider 03/10/2024  Perla Lye, MD Cardiology 08/31/2023; preop APP call 03/22/2024  Donette [place], FNP-C Advanced Heart Failure 12/13/2023  Marcelino Nurse, MD Pain Management 03/06/2024  Lane Hussar, MD Neurology 02/21/2024  Dennise Capri, MD Nephrology 10/20/2023   ALLERGIES: Allergies  Allergen Reactions   Ace Inhibitors Other (See Comments)   Colchicine  Other (See Comments)    Palpitations and headache   Lisinopril Cough    CURRENT HOME MEDICATIONS: No current facility-administered medications for this encounter.    acetaminophen  (TYLENOL ) 500 MG tablet   aspirin  EC 81 MG tablet   atorvastatin  (LIPITOR) 80 MG tablet   bisacodyl  (DULCOLAX) 5 MG EC tablet   carvedilol  (COREG ) 12.5 MG tablet   Cholecalciferol (VITAMIN D3) 50 MCG (2000 UT) capsule   clobetasol  (TEMOVATE ) 0.05 % external solution   cyanocobalamin  (VITAMIN B12) 1000 MCG tablet   cyclobenzaprine  (FLEXERIL ) 10 MG tablet   diazepam  (VALIUM ) 2 MG tablet   ezetimibe  (ZETIA ) 10 MG tablet   indomethacin  (INDOCIN ) 50 MG capsule   isosorbide  mononitrate (IMDUR ) 60 MG 24 hr tablet    JARDIANCE  10 MG TABS tablet   ketoconazole  (NIZORAL ) 2 % shampoo   metFORMIN  (GLUCOPHAGE -XR) 500 MG 24 hr tablet   Multiple Vitamin (MULTIVITAMIN WITH MINERALS) TABS tablet   nitroGLYCERIN  (NITROSTAT ) 0.4 MG SL tablet   OZEMPIC , 0.25 OR 0.5 MG/DOSE, 2 MG/3ML SOPN   pantoprazole  (PROTONIX ) 40 MG tablet   potassium chloride  (KLOR-CON ) 10 MEQ tablet   pregabalin  (LYRICA ) 100 MG capsule   sacubitril-valsartan (ENTRESTO ) 49-51 MG   tamsulosin  (FLOMAX ) 0.4 MG CAPS capsule   torsemide  (DEMADEX ) 20 MG tablet   Blood Glucose Monitoring Suppl (FREESTYLE LITE) DEVI   FREESTYLE LITE test strip   gemfibrozil  (LOPID ) 600 MG tablet   ibuprofen (ADVIL) 800 MG tablet   Lancets (FREESTYLE) lancets   oxyCODONE -acetaminophen  (PERCOCET) 5-325 MG tablet   Pyrithione Zinc (DERMAZINC SHAMPOO) 2 % SHAM   HISTORY: Past Medical History:  Diagnosis Date   (HFpEF) heart failure with preserved ejection fraction (HCC)    Allergy    Anginal pain    Anxiety    a.) on BZO (diazepam ) PRN   Aortic atherosclerosis    Arthritis    Benign paroxysmal positional vertigo due to bilateral vestibular disorder 07/05/2017   BPH (benign prostatic hyperplasia)    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 b.) NSTEMI 2016 -> PCI revealed pat RCA stent; sig dLM Dz with FFR ratio 0.76 and oLCx stenosis; ref to CVTS. c.) 2v CABG 04/21/2015 (LIMA-LAD, SVG-OM2); d.) [facility] 05/02/2018: EF 55-65%; LVEDP 14-15 mmHg; 10% p-m RCA, 30% pRCA, 20% dRCA, 95% o-pLCx, 80% m-dLM; LIMA graft pat. SVG graft with mild diffuse Dz.   DDD (degenerative disc disease), lumbar    Dyspnea    GERD (gastroesophageal reflux disease)    Hyperlipidemia associated with type 2  diabetes mellitus (HCC) 12/27/2020   Hypertension    Left foot drop    Long-term use of aspirin  therapy    NSTEMI (non-ST elevated myocardial infarction) (HCC) 2016   a.) PCI revealed patent RCA stent; significant dLM disease with FFR ratio 0.76 and oLCx stenosis; refer to CVTS for CABG.   OSA on CPAP    Peroneal tendinitis of right lower extremity    Postlaminectomy syndrome    a.) s/p SCS placement 05/26/2021   PSVT (paroxysmal supraventricular tachycardia)    S/P CABG x 2 04/21/2015   a.) LIMA-LAD, SVG-OM2   T2DM (type 2 diabetes mellitus) (HCC)    Unstable angina (HCC) 04/25/2018   Past Surgical History:  Procedure Laterality Date   APPENDECTOMY     BACK SURGERY  11/01/2017   SL 5 and S1   CARDIAC CATHETERIZATION     CARPAL TUNNEL RELEASE     left hand   CHOLECYSTECTOMY     CHOLECYSTECTOMY     COLONOSCOPY WITH PROPOFOL  N/A 10/28/2022   Procedure: COLONOSCOPY WITH PROPOFOL ;  Surgeon: Therisa Bi, MD;  Location: [facility] ENDOSCOPY;  Service: Gastroenterology;  Laterality: N/A;   CORONARY ARTERY BYPASS GRAFT  04/21/2015   CABG x 2 [place] [facility] LIMA to LAD amd SVG to OM2   CORONARY STENT PLACEMENT  2010   Cordis Cypher Sirolimus-eluting stent 2.50 mm x 26 mm placed to the RCA at [facility]    ESOPHAGEAL MANOMETRY N/A 08/04/2017   Procedure: ESOPHAGEAL MANOMETRY (EM);  Surgeon: Unk Corinn Skiff, MD;  Location: [facility] ENDOSCOPY;  Service: Endoscopy;  Laterality: N/A;   ESOPHAGOGASTRODUODENOSCOPY (EGD) WITH PROPOFOL  N/A 06/17/2022   Procedure: ESOPHAGOGASTRODUODENOSCOPY (EGD) WITH PROPOFOL ;  Surgeon: Therisa Bi, MD;  Location: [facility] ENDOSCOPY;  Service: Gastroenterology;  Laterality: N/A;   FRACTURE SURGERY     KNEE SURGERY     KNEE SURGERY     right knee    LAMINECTOMY     plate in neck R6-R5   LEFT HEART CATH AND CORS/GRAFTS ANGIOGRAPHY N/A 05/02/2018   Procedure: CORS/GRAFTS ANGIOGRAPHY;  Surgeon: Darron Deatrice LABOR, MD;  Location: [facility] INVASIVE CV LAB;   Service: Cardiovascular;  Laterality: N/A;   neck sx with plate     RIGHT/LEFT HEART CATH AND CORONARY ANGIOGRAPHY Bilateral 05/02/2018   Procedure: LEFT HEART CATH;  Surgeon: Darron Deatrice LABOR, MD;  Location: [facility] INVASIVE CV LAB;  Service: Cardiovascular;  Laterality: Bilateral;   SPINE SURGERY     THORACIC LAMINECTOMY FOR SPINAL CORD STIMULATOR N/A 05/26/2021   Procedure: THORACIC SPINAL CORD STIMULATOR AND PULSE GENERATOR PLACEMENT (MEDTRONIC);  Surgeon: Bluford Standing, MD;  Location: [facility] ORS;  Service: Neurosurgery;  Laterality: N/A;   VASECTOMY     Family History  Problem Relation Age of Onset   Arthritis Mother    Hyperlipidemia Father  Hypertension Father    Heart attack Father 26   Heart disease Father    Heart murmur Brother    Diabetes Brother    Valvular heart disease Brother    Obesity Brother    Cataracts Maternal Grandmother    Glaucoma Maternal Grandmother    Cancer Maternal Grandfather    Heart attack Paternal Grandfather    Hypertension Paternal Grandfather    Prostate cancer Neg Hx    Bladder Cancer Neg Hx    Kidney cancer Neg Hx    Social History   Tobacco Use   Smoking status: Former    Current packs/day: 0.00    Average packs/day: 0.3 packs/day for 2.0 years (0.5 ttl pk-yrs)    Types: Cigarettes, Pipe, Cigars    Start date: 05/27/2003    Quit date: 05/26/2005    Years since quitting: 18.8   Smokeless tobacco: Former    Types: Chew  Substance Use Topics   Alcohol use: No   LABS:  Lab Results  Component Value Date   WBC 6.3 03/21/2024   HGB 15.4 03/21/2024   HCT 45.2 03/21/2024   MCV 94.2 03/21/2024   PLT 204 03/21/2024   Lab Results  Component Value Date   NA 143 01/24/2024   CL 109 01/24/2024   K 4.1 01/24/2024   CO2 24 01/24/2024   BUN 19 01/24/2024   CREATININE 1.24 01/24/2024   GFRNONAA >60 01/24/2024   CALCIUM  9.4 01/24/2024   ALBUMIN 4.7 12/01/2023   GLUCOSE 131 (H) 01/24/2024    ECG: Date: 03/21/2024  Time ECG obtained:  0913 AM Rate: 72 bpm Rhythm: normal sinus Axis (leads I and aVF): normal Intervals: PR 164 ms. QRS 82 ms. QTc 411 ms. ST segment and T wave changes: Nonspecific ST abnormality  Evidence of a possible, age undetermined, prior infarct:  No Comparison: Similar to previous tracing obtained on 08/31/2023   IMAGING / PROCEDURES: TRANSTHORACIC ECHOCARDIOGRAM performed on 12/02/2023 Left ventricular ejection fraction, by estimation, is 60 to 65%. The left ventricle has normal function. The left ventricle has no regional wall motion abnormalities. Left ventricular diastolic parameters are consistent with Grade I diastolic dysfunction (impaired relaxation).  Right ventricular systolic function is normal. The right ventricular size is normal. Tricuspid regurgitation signal is inadequate for assessing PA pressure.  The mitral valve is normal in structure. No evidence of mitral valve regurgitation. No evidence of mitral stenosis.  The aortic valve is tricuspid. Aortic valve regurgitation is not visualized. No aortic stenosis is present.  The inferior vena cava is normal in size with greater than 50% respiratory variability, suggesting right atrial pressure of 3 mmHg.    LONG TERM CARDIAC EVENT MONITOR STUDY performed on 03/05/2022 Patch Wear Time:  13 days and 19 hours (2023-09-05T11:45:01-399 to 2023-09-19T07:43:03-399) Normal sinus rhythm Patient had a min HR of 48 bpm, max HR of 118 bpm, and avg HR of 73 bpm.  2 Supraventricular Tachycardia runs occurred, the run with the fastest interval lasting 4 beats with a max rate of 118 bpm (avg 101 bpm); the run with the fastest interval was also the longest.  Isolated SVEs were frequent (8.0%, [id number]), SVE Couplets were rare (<1.0%, 224), and SVE Triplets were rare (<1.0%, 29). Isolated VEs were rare (<1.0%), and no VE Couplets or VE Triplets were present.  Triggered events associated with normal sinus rhythm, rare PAC.  No significant  arrhythmia  MYOCARDIAL PERFUSION IMAGING STUDY (LEXISCAN ) performed on 02/26/2022 Pharmacological myocardial perfusion imaging study with no significant  ischemia Small region fixed defect in the inferolateral apical region, unable to exclude prior MI versus attenuation artifact GI uptake artifact noted Normal wall motion, EF estimated at 62% No EKG changes concerning for ischemia at peak stress or in recovery. CT attenuation correction images with three-vessel coronary calcification Low risk scan  LONG TERM CARDIAC EVENT MONITOR STUDY performed on 12/15/2018 Predominant underlying normal sinus rhythm with an average heart rate of 72 bpm Minimum heart rate 50 bpm 3 short runs of SVT No significant arrhythmia to explain dizziness   RIGHT/LEFT HEART CATHETERIZATION AND CORONARY ANGIOGRAPHY performed on 05/02/2018 LVEF 55-65% LVEDP mildly elevated at 14-15 mmHg Normal left ventricular systolic function Multivessel CAD 10% proximal to mid RCA 30% proximal RCA 20% distal RCA 95% ostial to proximal LCx 80% mid to distal LM Grafts LIMA-LAD bypass graft patent SVG-OM2 bypass graft exhibited mild diffuse disease Recommendations: Continue medical therapy for coronary artery disease and chronic diastolic heart failure   IMPRESSION AND PLAN: Jonathon Newman has been referred for pre-anesthesia review and clearance prior to him undergoing the planned anesthetic and procedural courses. Available labs, pertinent testing, and imaging results were personally reviewed by me in preparation for upcoming operative/procedural course. [facility] medical record has been updated following extensive record review and patient interview with PAT staff.   This patient has been appropriately cleared by cardiology with an overall ACCEPTABLE risk of patient experiencing significant perioperative cardiovascular complications. Based on clinical review performed today (03/24/24), barring any significant acute  changes in the patient's overall condition, it is anticipated that he will be able to proceed with the planned surgical intervention. Any acute changes in clinical condition may necessitate his procedure being postponed and/or cancelled. Patient will meet with anesthesia team (MD and/or CRNA) on the day of his procedure for preoperative evaluation/assessment. Questions regarding anesthetic course will be fielded at that time.   Pre-surgical instructions were reviewed with the patient during his PAT appointment, and questions were fielded to satisfaction by PAT clinical staff. He has been instructed on which medications that he will need to hold prior to surgery, as well as the ones that have been deemed safe/appropriate to take on the day of his procedure. As part of the general education provided by PAT, patient made aware both verbally and in writing, that he would need to abstain from the use of any illegal substances during his perioperative course. He was advised that failure to follow the provided instructions could necessitate case cancellation or result in serious perioperative complications up to and including death. Patient encouraged to contact PAT and/or his surgeon's office to discuss any questions or concerns that may arise prior to surgery; verbalized understanding.   Dorise Pereyra, MSN, APRN, FNP-C, CEN [facility]  Perioperative Services Nurse Practitioner Phone: [phone] Fax: [phone] 03/24/24 2:25 PM  NOTE: This note has been prepared using Scientist, clinical (histocompatibility and immunogenetics). Despite my best ability to proofread, there is always the potential that unintentional transcriptional errors may still occur from this process.

## 2024-03-27 ENCOUNTER — Other Ambulatory Visit: Payer: Self-pay

## 2024-03-27 ENCOUNTER — Encounter
Admission: RE | Disposition: A | Discharge status: Home / Self Care | Payer: Self-pay | Source: Home / Self Care | Attending: Podiatry

## 2024-03-27 ENCOUNTER — Ambulatory Visit
Admission: RE | Admit: 2024-03-27 | Discharge: 2024-03-27 | Disposition: A | Discharge status: Home / Self Care | Attending: Podiatry | Admitting: Podiatry

## 2024-03-27 ENCOUNTER — Ambulatory Visit

## 2024-03-27 ENCOUNTER — Ambulatory Visit: Payer: Self-pay | Admitting: Urgent Care

## 2024-03-27 ENCOUNTER — Encounter: Payer: Self-pay | Admitting: Podiatry

## 2024-03-27 DIAGNOSIS — D1723 Benign lipomatous neoplasm of skin and subcutaneous tissue of right leg: Secondary | ICD-10-CM | POA: Insufficient documentation

## 2024-03-27 DIAGNOSIS — M961 Postlaminectomy syndrome, not elsewhere classified: Secondary | ICD-10-CM | POA: Insufficient documentation

## 2024-03-27 DIAGNOSIS — G4733 Obstructive sleep apnea (adult) (pediatric): Secondary | ICD-10-CM | POA: Insufficient documentation

## 2024-03-27 DIAGNOSIS — K219 Gastro-esophageal reflux disease without esophagitis: Secondary | ICD-10-CM | POA: Insufficient documentation

## 2024-03-27 DIAGNOSIS — E1100 Type 2 diabetes mellitus with hyperosmolarity without nonketotic hyperglycemic-hyperosmolar coma (NKHHC): Secondary | ICD-10-CM

## 2024-03-27 DIAGNOSIS — Z9682 Presence of neurostimulator: Secondary | ICD-10-CM | POA: Diagnosis not present

## 2024-03-27 DIAGNOSIS — J449 Chronic obstructive pulmonary disease, unspecified: Secondary | ICD-10-CM | POA: Insufficient documentation

## 2024-03-27 DIAGNOSIS — M7671 Peroneal tendinitis, right leg: Secondary | ICD-10-CM | POA: Insufficient documentation

## 2024-03-27 DIAGNOSIS — I471 Supraventricular tachycardia, unspecified: Secondary | ICD-10-CM | POA: Diagnosis not present

## 2024-03-27 DIAGNOSIS — I25118 Atherosclerotic heart disease of native coronary artery with other forms of angina pectoris: Secondary | ICD-10-CM | POA: Insufficient documentation

## 2024-03-27 DIAGNOSIS — Z955 Presence of coronary angioplasty implant and graft: Secondary | ICD-10-CM | POA: Insufficient documentation

## 2024-03-27 DIAGNOSIS — M7989 Other specified soft tissue disorders: Secondary | ICD-10-CM | POA: Diagnosis not present

## 2024-03-27 DIAGNOSIS — I252 Old myocardial infarction: Secondary | ICD-10-CM | POA: Insufficient documentation

## 2024-03-27 DIAGNOSIS — I5032 Chronic diastolic (congestive) heart failure: Secondary | ICD-10-CM | POA: Insufficient documentation

## 2024-03-27 DIAGNOSIS — E785 Hyperlipidemia, unspecified: Secondary | ICD-10-CM | POA: Diagnosis not present

## 2024-03-27 DIAGNOSIS — Z7984 Long term (current) use of oral hypoglycemic drugs: Secondary | ICD-10-CM | POA: Diagnosis not present

## 2024-03-27 DIAGNOSIS — Z951 Presence of aortocoronary bypass graft: Secondary | ICD-10-CM | POA: Diagnosis not present

## 2024-03-27 DIAGNOSIS — I110 Hypertensive heart disease with heart failure: Secondary | ICD-10-CM | POA: Diagnosis not present

## 2024-03-27 DIAGNOSIS — Z79899 Other long term (current) drug therapy: Secondary | ICD-10-CM | POA: Diagnosis not present

## 2024-03-27 DIAGNOSIS — I2510 Atherosclerotic heart disease of native coronary artery without angina pectoris: Secondary | ICD-10-CM | POA: Diagnosis not present

## 2024-03-27 DIAGNOSIS — E1151 Type 2 diabetes mellitus with diabetic peripheral angiopathy without gangrene: Secondary | ICD-10-CM | POA: Insufficient documentation

## 2024-03-27 DIAGNOSIS — Z7985 Long-term (current) use of injectable non-insulin antidiabetic drugs: Secondary | ICD-10-CM | POA: Diagnosis not present

## 2024-03-27 DIAGNOSIS — Z01812 Encounter for preprocedural laboratory examination: Secondary | ICD-10-CM

## 2024-03-27 DIAGNOSIS — Z87891 Personal history of nicotine dependence: Secondary | ICD-10-CM | POA: Insufficient documentation

## 2024-03-27 DIAGNOSIS — M67471 Ganglion, right ankle and foot: Secondary | ICD-10-CM | POA: Insufficient documentation

## 2024-03-27 HISTORY — DX: Unspecified diastolic (congestive) heart failure: I50.30

## 2024-03-27 HISTORY — PX: ACHILLES TENDON SURGERY: SHX542

## 2024-03-27 HISTORY — DX: Supraventricular tachycardia, unspecified: I47.10

## 2024-03-27 HISTORY — DX: Anxiety disorder, unspecified: F41.9

## 2024-03-27 HISTORY — DX: Long term (current) use of aspirin: Z79.82

## 2024-03-27 HISTORY — DX: Peroneal tendinitis, right leg: M76.71

## 2024-03-27 HISTORY — DX: Atherosclerosis of aorta: I70.0

## 2024-03-27 LAB — GLUCOSE, CAPILLARY
Glucose-Capillary: 151 mg/dL — ABNORMAL HIGH (ref 70–99)
Glucose-Capillary: 125 mg/dL — ABNORMAL HIGH (ref 70–99)

## 2024-03-27 SURGERY — REPAIR, TENDON, ACHILLES
Anesthesia: Monitor Anesthesia Care | Laterality: Right

## 2024-03-27 MED ORDER — DROPERIDOL 2.5 MG/ML IJ SOLN: 0.6250 mg | Freq: Once | INTRAMUSCULAR | Status: DC | PRN

## 2024-03-27 MED ORDER — 0.9 % SODIUM CHLORIDE (POUR BTL) OPTIME
TOPICAL | Status: DC | PRN
  Administered 2024-03-27: 500 mL

## 2024-03-27 MED ORDER — ACETAMINOPHEN 10 MG/ML IV SOLN: 1000.0000 mg | Freq: Once | INTRAVENOUS | Status: DC | PRN

## 2024-03-27 MED ORDER — MIDAZOLAM HCL 2 MG/2ML IJ SOLN
INTRAMUSCULAR | Status: DC | PRN
  Administered 2024-03-27: 2 mg via INTRAVENOUS

## 2024-03-27 MED ORDER — CEFAZOLIN SODIUM-DEXTROSE 2-4 GM/100ML-% IV SOLN
INTRAVENOUS | Status: AC
  Filled 2024-03-27: qty 100

## 2024-03-27 MED ORDER — CHLORHEXIDINE GLUCONATE 0.12 % MT SOLN
OROMUCOSAL | Status: AC
  Filled 2024-03-27: qty 15

## 2024-03-27 MED ORDER — LACTATED RINGERS IV SOLN: INTRAVENOUS | Status: DC | PRN

## 2024-03-27 MED ORDER — FENTANYL CITRATE (PF) 100 MCG/2ML IJ SOLN
INTRAMUSCULAR | Status: DC | PRN
  Administered 2024-03-27 (×2): 25 ug via INTRAVENOUS

## 2024-03-27 MED ORDER — OXYCODONE HCL 5 MG PO TABS
ORAL_TABLET | ORAL | Status: AC
  Filled 2024-03-27: qty 1

## 2024-03-27 MED ORDER — MIDAZOLAM HCL 2 MG/2ML IJ SOLN
INTRAMUSCULAR | Status: AC
  Filled 2024-03-27: qty 2

## 2024-03-27 MED ORDER — LIDOCAINE HCL URETHRAL/MUCOSAL 2 % EX GEL: CUTANEOUS | Status: DC | PRN

## 2024-03-27 MED ORDER — PROPOFOL 10 MG/ML IV BOLUS
INTRAVENOUS | Status: DC | PRN
  Administered 2024-03-27: 100 ug/kg/min via INTRAVENOUS

## 2024-03-27 MED ORDER — ORAL CARE MOUTH RINSE: 15.0000 mL | Freq: Once | OROMUCOSAL | Status: DC

## 2024-03-27 MED ORDER — CEFAZOLIN SODIUM-DEXTROSE 2-4 GM/100ML-% IV SOLN
2.0000 g | Freq: Once | INTRAVENOUS | Status: AC
  Administered 2024-03-27: 2 g via INTRAVENOUS

## 2024-03-27 MED ORDER — SODIUM CHLORIDE 0.9 % IV SOLN: INTRAVENOUS | Status: DC

## 2024-03-27 MED ORDER — OXYCODONE HCL 5 MG PO TABS
5.0000 mg | ORAL_TABLET | Freq: Once | ORAL | Status: AC | PRN
  Administered 2024-03-27: 5 mg via ORAL

## 2024-03-27 MED ORDER — FENTANYL CITRATE (PF) 100 MCG/2ML IJ SOLN
INTRAMUSCULAR | Status: AC
  Filled 2024-03-27: qty 2

## 2024-03-27 MED ORDER — BUPIVACAINE-EPINEPHRINE (PF) 0.5% -1:200000 IJ SOLN
INTRAMUSCULAR | Status: AC
  Filled 2024-03-27: qty 30

## 2024-03-27 MED ORDER — ACETAMINOPHEN 10 MG/ML IV SOLN
INTRAVENOUS | Status: DC | PRN
  Administered 2024-03-27: 1000 mg via INTRAVENOUS

## 2024-03-27 MED ORDER — CHLORHEXIDINE GLUCONATE 0.12 % MT SOLN: 15.0000 mL | Freq: Once | OROMUCOSAL | Status: DC

## 2024-03-27 MED ORDER — OXYCODONE HCL 5 MG/5ML PO SOLN: 5.0000 mg | Freq: Once | ORAL | Status: AC | PRN

## 2024-03-27 MED ORDER — LIDOCAINE-EPINEPHRINE (PF) 1 %-1:200000 IJ SOLN
INTRAMUSCULAR | Status: AC
  Filled 2024-03-27: qty 30

## 2024-03-27 MED ORDER — FENTANYL CITRATE (PF) 100 MCG/2ML IJ SOLN: 25.0000 ug | INTRAMUSCULAR | Status: DC | PRN

## 2024-03-27 MED ORDER — LIDOCAINE-EPINEPHRINE (PF) 1 %-1:200000 IJ SOLN
INTRAMUSCULAR | Status: DC | PRN
  Administered 2024-03-27: 10 mL

## 2024-03-27 MED ORDER — LIDOCAINE HCL (CARDIAC) PF 100 MG/5ML IV SOSY
PREFILLED_SYRINGE | INTRAVENOUS | Status: DC | PRN
  Administered 2024-03-27: 100 mg via INTRAVENOUS

## 2024-03-27 SURGICAL SUPPLY — 47 items
BLADE SURG 15 STRL LF DISP TIS (BLADE) IMPLANT
BNDG COHESIVE 6X5 TAN ST LF (GAUZE/BANDAGES/DRESSINGS) ×1 IMPLANT
BNDG COMPR 6X5.8 VLCR NS LF (GAUZE/BANDAGES/DRESSINGS) ×2 IMPLANT
BNDG ELASTIC 4INX 5YD STR LF (GAUZE/BANDAGES/DRESSINGS) ×1 IMPLANT
BNDG ELASTIC 4X5.8 VLCR NS LF (GAUZE/BANDAGES/DRESSINGS) ×1 IMPLANT
BNDG ELASTIC 6INX 5YD STR LF (GAUZE/BANDAGES/DRESSINGS) ×1 IMPLANT
BNDG ESMARCH 4X12 STRL LF (GAUZE/BANDAGES/DRESSINGS) ×1 IMPLANT
BNDG GAUZE DERMACEA FLUFF 4 (GAUZE/BANDAGES/DRESSINGS) ×1 IMPLANT
BOOT WALKER LARGE (SOFTGOODS) IMPLANT
COVER LIGHT HANDLE STERIS (MISCELLANEOUS) ×1 IMPLANT
CUFF TOURN SGL QUICK 18X4 (TOURNIQUET CUFF) IMPLANT
DRAPE FLUOR MINI C-ARM 54X84 (DRAPES) ×1 IMPLANT
DRAPE U-SHAPE 47X51 STRL (DRAPES) ×1 IMPLANT
DURAPREP 26ML APPLICATOR (WOUND CARE) ×1 IMPLANT
GAUZE PAD ABD 8X10 STRL (GAUZE/BANDAGES/DRESSINGS) ×3 IMPLANT
GAUZE SPONGE 4X4 12PLY STRL (GAUZE/BANDAGES/DRESSINGS) ×1 IMPLANT
GAUZE XEROFORM 1X8 LF (GAUZE/BANDAGES/DRESSINGS) ×1 IMPLANT
GLOVE BIO SURGEON STRL SZ7.5 (GLOVE) ×1 IMPLANT
GLOVE SURG SYN 7.0 PF PI (GLOVE) ×1 IMPLANT
IMPL TENDON SHEET TENOGLID 2X2 IMPLANT
NDL HYPO 22X1.5 SAFETY MO (MISCELLANEOUS) ×1 IMPLANT
NEEDLE HYPO 22X1.5 SAFETY MO (MISCELLANEOUS) ×1 IMPLANT
PACK BASIN MAJOR ARMC (MISCELLANEOUS) ×1 IMPLANT
PACK EXTREMITY ARMC (MISCELLANEOUS) ×1 IMPLANT
PAD ABD DERMACEA PRESS 5X9 (GAUZE/BANDAGES/DRESSINGS) IMPLANT
PADDING CAST BLEND 4X4 STRL (MISCELLANEOUS) ×2 IMPLANT
PENCIL SMOKE EVACUATOR (MISCELLANEOUS) ×2 IMPLANT
RASP SM TEAR CROSS CUT (RASP) ×1 IMPLANT
SOLN 0.9% NACL 1000 ML (IV SOLUTION) ×1 IMPLANT
SOLN 0.9% NACL POUR BTL 1000ML (IV SOLUTION) ×1 IMPLANT
SPIKE FLUID TRANSFER (MISCELLANEOUS) ×1 IMPLANT
SPLINT CAST 1 STEP 4X15 (MISCELLANEOUS) ×1 IMPLANT
SPLINT CAST 1 STEP 5X30 WHT (MISCELLANEOUS) ×1 IMPLANT
SPONGE T-LAP 4X18 ~~LOC~~+RFID (SPONGE) ×1 IMPLANT
STAPLER SKIN PROX 35W (STAPLE) ×1 IMPLANT
STOCKINETTE ORTHO 6X25 (MISCELLANEOUS) ×1 IMPLANT
SUCTION TUBE FRAZIER 10FR DISP (SUCTIONS) ×1 IMPLANT
SUT ETHILON 3-0 (SUTURE) ×1 IMPLANT
SUT ETHILON NAB PS2 4-0 18IN (SUTURE) ×1 IMPLANT
SUT MERSILENE 4-0 WHT RB-1 (SUTURE) IMPLANT
SUT MNCRL AB 3-0 PS2 27 (SUTURE) IMPLANT
SUT MON AB 3-0 SH27 (SUTURE) ×1 IMPLANT
SUT VIC AB 3-0 PS2 18XBRD (SUTURE) ×1 IMPLANT
SUT VIC AB 4-0 PS2 27 (SUTURE) ×1 IMPLANT
SYR CONTROL 10ML LL (SYRINGE) ×1 IMPLANT
TOWEL OR 17X26 4PK STRL BLUE (TOWEL DISPOSABLE) ×1 IMPLANT
TRAP FLUID SMOKE EVACUATOR (MISCELLANEOUS) ×1 IMPLANT

## 2024-03-27 NOTE — Anesthesia Preprocedure Evaluation (Signed)
 Anesthesia Evaluation  Patient identified by MRN, date of birth, ID band Patient awake    Reviewed: Allergy & Precautions, H&P , NPO status , Patient's Chart, lab work & pertinent test results, reviewed documented beta blocker date and time   Airway Mallampati: II  TM Distance: >3 FB Neck ROM: full    Dental no notable dental hx. (+) Teeth Intact   Pulmonary shortness of breath and with exertion, sleep apnea , COPD, former smoker   Pulmonary exam normal breath sounds clear to auscultation       Cardiovascular Exercise Tolerance: Poor hypertension, On Medications + angina with exertion + CAD, + Past MI, + Peripheral Vascular Disease, +CHF and + DOE  (-) Orthopnea and (-) PND  Rhythm:regular Rate:Normal     Neuro/Psych   Anxiety      Neuromuscular disease  negative psych ROS   GI/Hepatic Neg liver ROS,GERD  Medicated,,  Endo/Other  negative endocrine ROSdiabetes    Renal/GU      Musculoskeletal   Abdominal   Peds  Hematology negative hematology ROS (+)   Anesthesia Other Findings   Reproductive/Obstetrics negative OB ROS                              Anesthesia Physical Anesthesia Plan  ASA: 3  Anesthesia Plan: MAC   Post-op Pain Management:    Induction:   PONV Risk Score and Plan: 2  Airway Management Planned:   Additional Equipment:   Intra-op Plan:   Post-operative Plan:   Informed Consent: I have reviewed the patients History and Physical, chart, labs and discussed the procedure including the risks, benefits and alternatives for the proposed anesthesia with the patient or authorized representative who has indicated his/her understanding and acceptance.       Plan Discussed with: CRNA  Anesthesia Plan Comments:         Anesthesia Quick Evaluation

## 2024-03-27 NOTE — Interval H&P Note (Signed)
 History and Physical Interval Note:  03/27/2024 7:54 AM  Jonathon Newman  has presented today for surgery, with the diagnosis of Peroneal tendinitis of right lower extremity Lipoma of right lower extremity.  The various methods of treatment have been discussed with the patient and family. After consideration of risks, benefits and other options for treatment, the patient has consented to  Procedure(s) with comments: REPAIR, TENDON, ACHILLES (Right) - IV SEDATION as a surgical intervention.  [name] history has been reviewed, patient examined, no change in status, stable for surgery.  I have reviewed the patient's chart and labs.  Questions were answered to the patient's satisfaction.     Franky SHAUNNA Blanch

## 2024-03-27 NOTE — Op Note (Signed)
 Surgeon: Surgeon(s): Tobie Franky SQUIBB, DPM  Assistants: None Pre-operative diagnosis: Peroneal tendinitis of right lower extremity Lipoma of right lower extremity  Post-operative diagnosis: same Procedure: Procedure(s) (LRB): REPAIR, TENDON, ACHILLES (Right)  Pathology:  ID Type Source Tests Collected by Time Destination  1 : Soft Tissue Mass Right Lateral Foot Tissue Path Tissue SURGICAL PATHOLOGY Tobie Franky SQUIBB, DPM 03/27/2024 0840     Pertinent Intra-op findings: Ganglion cyst noted with interstitial tearing of the peroneal brevis tendon mild Anesthesia: Monitor Anesthesia Care  Hemostasis: * Missing tourniquet times found for documented tourniquets in log: [id number] * EBL: 50 cc Materials: Tino glide tendon graft, 3-0 Monocryl, skin staples Injectables: None Complications: None  Indications for surgery: A 68 y.o. male presents with left peroneal tendinitis with soft tissue mass painful. Patient has failed all conservative therapy including but not limited to shoe gear modification padding protecting cam boot immobilization. He wishes to have surgical correction of the foot/deformity. It was determined that patient would benefit from left excision of soft tissue mass and primary repair of the peroneal tendons. Informed surgical risk consent was reviewed and read aloud to the patient.  I reviewed the films.  I have discussed my findings with the patient in great detail.  I have discussed all risks including but not limited to infection, stiffness, scarring, limp, disability, deformity, damage to blood vessels and nerves, numbness, poor healing, need for braces, arthritis, chronic pain, amputation, death.  All benefits and realistic expectations discussed in great detail.  I have made no promises as to the outcome.  I have provided realistic expectations.  I have offered the patient a 2nd opinion, which they have declined and assured me they preferred to proceed despite the risks   Procedure in  detail: The patient was both verbally and visually identified by myself, the nursing staff, and anesthesia staff in the preoperative holding area. They were then transferred to the operating room and placed on the operative table in supine position.  Attention was directed to the lateral foot, a skin marker was used to delineate a longitudinal incision on the posterior fibula down to the lateral side of the calcaneus.  A #15 blade was used to carry the incision from epidermal dermal junction down to the level of the soft tissue mass.  At this time known the ganglion cyst was noted anterior to the peroneal tendon.  Ganglion cyst was excised completely and sent to pathology sterile technique.  No complication noted.  At this time the incision was carried down to the retinaculum she and peroneal tendons were exposed.  Both tendons were evaluated there were some peroneal brevis interstitial tearing noted.  The peroneal brevis tendon was tubularized with 3-0 Mersilene.  Good correction alignment noted.  Graft was applied to help decrease adhesions and help and allow the tendon to glide better.  No complication noted.  3-0 Monocryl was used to close the deep layers.  Skin stapler was used to close the skin.  The incision was dressed with Xeroform Kerlix 4 x 4 gauze Ace bandage.  All bony prominences were adequately padded  At the conclusion of the procedure the patient was awoken from anesthesia and found to have tolerated the procedure well any complications. There were transferred to PACU with vital signs stable and vascular status intact.  Scharlene Catalina, DPM

## 2024-03-27 NOTE — Anesthesia Postprocedure Evaluation (Signed)
 Anesthesia Post Note  Patient: Jonathon Newman  Procedure(s) Performed: REPAIR, TENDON, ACHILLES (Right)  Patient location during evaluation: PACU Anesthesia Type: MAC Level of consciousness: awake and alert Pain management: pain level controlled Vital Signs Assessment: post-procedure vital signs reviewed and stable Respiratory status: spontaneous breathing, nonlabored ventilation and respiratory function stable Cardiovascular status: blood pressure returned to baseline and stable Postop Assessment: no apparent nausea or vomiting Anesthetic complications: no   There were no known notable events for this encounter.   Last Vitals:  Vitals:   03/27/24 1028 03/27/24 1041  BP:  (!) 171/91  Pulse: (!) 57   Resp: 16   Temp: 36.7 C   SpO2: 99%     Last Pain:  Vitals:   03/27/24 1028  TempSrc: Temporal  PainSc: 3                  Jonathon Newman

## 2024-03-27 NOTE — Transfer of Care (Signed)
 Immediate Anesthesia Transfer of Care Note  Patient: Jonathon Newman  Procedure(s) Performed: REPAIR, TENDON, ACHILLES (Right)  Patient Location: PACU  Anesthesia Type:MAC  Level of Consciousness: awake, alert , and oriented  Airway & Oxygen Therapy: Patient Spontanous Breathing  Post-op Assessment: Post -op Vital signs reviewed and stable  Post vital signs: stable  Last Vitals:  Vitals Value Taken Time  BP 148/65 03/27/24 09:45  Temp 36.2 C 03/27/24 09:01  Pulse 59 03/27/24 09:54  Resp 18 03/27/24 09:54  SpO2 97 % 03/27/24 09:54  Vitals shown include unfiled device data.  Last Pain:  Vitals:   03/27/24 0944  PainSc: 4          Complications: There were no known notable events for this encounter.

## 2024-03-27 NOTE — Discharge Instructions (Signed)
 After Surgery Instructions   1) If you are recuperating from surgery anywhere other than home, please be sure to leave us  the number where you can be reached.  2) Go directly home and rest.  3) Keep the operated foot(feet) elevated six inches above the hip when sitting or lying down. This will help control swelling and pain.  4) Support the elevated foot and leg with pillows. DO NOT PLACE PILLOWS UNDER THE KNEE.  5) DO NOT REMOVE or get your bandages WET, unless you were given different instructions by your doctor to do so. This increases the risk of infection.  6) Wear your surgical shoe or surgical boot at all times when you are up on your feet.  7) A limited amount of pain and swelling may occur. The skin may take on a bruised appearance. DO NOT BE ALARMED, THIS IS NORMAL.  8) For slight pain and swelling, apply an ice pack directly over the bandages for 15 minutes only out of each hour of the day. Continue until seen in the office for your first post op visit. DON NOT     APPLY ANY FORM OF HEAT TO THE AREA.  9) Have prescriptions filled immediately and take as directed.  10) Drink lots of liquids, water and juice to stay hydrated.  11) CALL IMMEDIATELY IF:  *Bleeding continues until the following day of surgery  *Pain increases and/or does not respond to medication  *Bandages or cast appears to tight  *If your bandage gets wet  *Trip, fall or stump your surgical foot  *If your temperature goes above 101  *If you have ANY questions at all  YOU NOW CONTROL THE EFFORT OF YOUR RECOVERY. ADHERING TO THESE INSTRUCTIONS WILL OFFER YOU THE MOST COMPLETE RESULTS

## 2024-03-28 ENCOUNTER — Encounter: Payer: Self-pay | Admitting: Podiatry

## 2024-03-28 ENCOUNTER — Ambulatory Visit: Admitting: Student in an Organized Health Care Education/Training Program

## 2024-03-28 LAB — SURGICAL PATHOLOGY

## 2024-03-31 ENCOUNTER — Ambulatory Visit: Admitting: Family Medicine

## 2024-04-04 ENCOUNTER — Ambulatory Visit (INDEPENDENT_AMBULATORY_CARE_PROVIDER_SITE_OTHER): Admitting: Podiatry

## 2024-04-04 DIAGNOSIS — Z9889 Other specified postprocedural states: Secondary | ICD-10-CM

## 2024-04-04 DIAGNOSIS — D1723 Benign lipomatous neoplasm of skin and subcutaneous tissue of right leg: Secondary | ICD-10-CM

## 2024-04-04 DIAGNOSIS — M7671 Peroneal tendinitis, right leg: Secondary | ICD-10-CM

## 2024-04-04 NOTE — Progress Notes (Signed)
 Subjective:  Patient ID: Jonathon Newman, male    DOB: 04-19-1956,  MRN: [id number]  Chief Complaint  Patient presents with   Routine Post Op    POV # 1 DOS 03/27/24 RT PERONEAL TENDON REPAIR AND EXCISION OF SOFT TISSUE MASS    DOS: 03/27/2024 Procedure: Right peroneal tendon repair and excision of soft tissue mass  68 y.o. male returns for post-op check.  He states is doing okay pain is controlled.  Bandages clean dry and intact he is partially weightbearing with the cam boot on with a walker.  Review of Systems: Negative except as noted in the HPI. Denies N/V/F/Ch.  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 b.) NSTEMI 2016 -> PCI revealed pat RCA stent; sig dLM Dz with FFR ratio 0.76 and oLCx stenosis; ref to CVTS. c.) 2v CABG 04/21/2015 (LIMA-LAD, SVG-OM2); d.) [place] 05/02/2018: EF 55-65%; LVEDP 14-15 mmHg; 10% p-m RCA, 30% pRCA, 20% dRCA, 95% o-pLCx, 80% m-dLM; LIMA graft pat. SVG graft with mild diffuse Dz.   DDD (degenerative disc disease), lumbar    Dyspnea    GERD (gastroesophageal reflux disease)    Hyperlipidemia associated with type 2 diabetes mellitus (HCC) 12/27/2020   Hypertension    Left foot drop    Long-term use of aspirin  therapy    NSTEMI (non-ST elevated myocardial infarction) (HCC) 2016   a.) PCI revealed patent RCA stent; significant dLM disease  with FFR ratio 0.76 and oLCx stenosis; refer to CVTS for CABG.   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 Swallow whole., Disp: , Rfl:    atorvastatin  (LIPITOR) 80 MG tablet, Take 1 tablet (80 mg total) by mouth daily., Disp: , Rfl:    bisacodyl  (DULCOLAX) 5 MG EC tablet, Take 5 mg by mouth every other day., Disp: , Rfl:    Blood Glucose Monitoring Suppl (FREESTYLE LITE) DEVI, To check blood sugar once daily, Disp: 1 each, Rfl: 0   carvedilol  (COREG ) 12.5 MG tablet, Take 1 tablet (12.5 mg total) by mouth 2 (two) times daily with a meal., Disp: 180 tablet, Rfl: 3   Cholecalciferol (VITAMIN D3) 50 MCG (2000 UT) capsule, Take 2,000 Units by mouth daily., Disp: , Rfl:    clobetasol  (TEMOVATE ) 0.05 % external solution, Apply 1 Application  topically 2 (two) times daily., Disp: 50 mL, Rfl: 11   cyanocobalamin  (VITAMIN B12) 1000 MCG tablet, Take 1,000 mcg by mouth daily., Disp: , Rfl:    cyclobenzaprine  (FLEXERIL ) 10 MG tablet, Take 1 tablet (10 mg total) by mouth 3 (three) times daily as needed for muscle spasms. (Patient taking differently: Take 10 mg by mouth 3 (three) times daily.), Disp: 90 tablet, Rfl: 1   diazepam  (VALIUM ) 2 MG tablet, Take 2 mg by mouth in the morning, at noon, in the evening, and at bedtime., Disp: , Rfl:    ezetimibe  (ZETIA ) 10 MG tablet, TAKE 1 TABLET DAILY, Disp: 90 tablet, Rfl: 3   FREESTYLE LITE test strip, USE TO CHECK BLOOD SUGAR ONCE DAILY, Disp: 100 strip, Rfl: 3   gemfibrozil   (LOPID ) 600 MG tablet, Take 1 tablet (600 mg total) by mouth 2 (two) times daily before a meal., Disp: 180 tablet, Rfl: 3   ibuprofen (ADVIL) 800 MG tablet, Take 1 tablet (800 mg total) by mouth every 6 (six) hours as needed., Disp: 60 tablet, Rfl: 1   indomethacin  (INDOCIN ) 50 MG capsule, Take 1 capsule (50 mg total) by mouth daily as needed (gout flare). Taking once daily (Patient taking differently: Take 50 mg by mouth daily as needed (gout flare).), Disp: 90 capsule, Rfl: 1   isosorbide  mononitrate (IMDUR ) 60 MG 24 hr tablet, TAKE 1 TABLET DAILY, Disp: 90 tablet, Rfl: 3   JARDIANCE  10 MG TABS tablet, TAKE 1 TABLET DAILY BEFORE BREAKFAST, Disp: 30 tablet, Rfl: 11   ketoconazole  (NIZORAL ) 2 % shampoo, Apply topically 2 (two) times a week., Disp: 120 mL, Rfl: 3   Lancets (FREESTYLE) lancets, USE TO CHECK BLOOD SUGAR ONCE DAILY, Disp: 100 each, Rfl: 4   metFORMIN  (GLUCOPHAGE -XR) 500 MG 24 hr tablet, TAKE 2 TABLETS TWICE A DAY WITH MEALS (NEED FOLLOW UP APPOINTMENT FOR UPDATED LABS AND FURTHER REFILLS), Disp: 120 tablet, Rfl: 11   Multiple Vitamin (MULTIVITAMIN WITH MINERALS) TABS tablet, Take 1 tablet by mouth daily., Disp: , Rfl:    nitroGLYCERIN  (NITROSTAT ) 0.4 MG SL tablet, DISSOLVE 1 TABLET UNDER THE TONGUE EVERY 5 MINUTES AS NEEDED FOR CHEST PAIN, Disp: 25 tablet, Rfl: 5   oxyCODONE -acetaminophen  (PERCOCET) 5-325 MG tablet, Take 1 tablet by mouth every 4 (four) hours as needed for severe pain (pain score 7-10)., Disp: 30 tablet, Rfl: 0   OZEMPIC , 0.25 OR 0.5 MG/DOSE, 2 MG/3ML SOPN, INJECT 0.5 MG UNDER THE SKIN ONCE A WEEK, Disp: 9 mL, Rfl: 3   pantoprazole  (PROTONIX ) 40 MG tablet, TAKE 1 TABLET DAILY, Disp: 90 tablet, Rfl: 3   potassium chloride  (KLOR-CON ) 10 MEQ tablet, TAKE 2 TABLETS TWICE A DAY, Disp: 360 tablet, Rfl: 3   pregabalin  (LYRICA ) 100 MG capsule, Take 100 mg by mouth 3 (three) times daily., Disp: , Rfl:    Pyrithione Zinc (DERMAZINC SHAMPOO) 2 % SHAM, Apply 1 Dose topically as  needed., Disp: 480 mL, Rfl: 0   sacubitril-valsartan (ENTRESTO ) 49-51 MG, Take 1 tablet by mouth 2 (two) times daily., Disp: 180 tablet, Rfl: 3   tamsulosin  (FLOMAX ) 0.4 MG CAPS capsule, Take 1 capsule (0.4 mg total) by mouth daily., Disp: 90 capsule, Rfl: 2   torsemide  (DEMADEX ) 20 MG tablet, Take 2 tablets (40 mg total) by mouth every other day., Disp: 90 tablet, Rfl: 3  Social History   Tobacco Use  Smoking Status Former   Current packs/day: 0.00   Average packs/day: 0.3 packs/day for 2.0 years (0.5 ttl pk-yrs)  Types: Cigarettes, Pipe, Cigars   Start date: 05/27/2003   Quit date: 05/26/2005   Years since quitting: 18.8  Smokeless Tobacco Former   Types: Chew    Allergies  Allergen Reactions   Ace Inhibitors Other (See Comments)   Colchicine  Other (See Comments)    Palpitations and headache   Lisinopril Cough   Objective:  There were no vitals filed for this visit. There is no height or weight on file to calculate BMI. Constitutional Well developed. Well nourished.  Vascular Foot warm and well perfused. Capillary refill normal to all digits.   Neurologic Normal speech. Oriented to person, place, and time. Epicritic sensation to light touch grossly present bilaterally.  Dermatologic Skin healing well without signs of infection. Skin edges well coapted without signs of infection.  Orthopedic: Tenderness to palpation noted about the surgical site.   Radiographs: None Assessment:   1. Peroneal tendinitis of lower leg, right   2. Lipoma of right lower extremity   3. Status post foot surgery    Plan:  Patient was evaluated and treated and all questions answered.  S/p foot surgery right -Progressing as expected post-operatively. -XR: See above -WB Status: Nonweightbearing in right lower extremity with walker -Sutures: Intact.  No clinical signs of dehiscence noted no complication noted. -Medications: None -Foot redressed.  No follow-ups on file.

## 2024-04-18 ENCOUNTER — Ambulatory Visit (INDEPENDENT_AMBULATORY_CARE_PROVIDER_SITE_OTHER): Admitting: Podiatry

## 2024-04-18 DIAGNOSIS — M7671 Peroneal tendinitis, right leg: Secondary | ICD-10-CM

## 2024-04-18 DIAGNOSIS — D1723 Benign lipomatous neoplasm of skin and subcutaneous tissue of right leg: Secondary | ICD-10-CM

## 2024-04-18 DIAGNOSIS — Z9889 Other specified postprocedural states: Secondary | ICD-10-CM

## 2024-04-18 NOTE — Progress Notes (Signed)
 Subjective:  Patient ID: Jonathon Newman, male    DOB: [birth year],  MRN: [id number]  Chief Complaint  Patient presents with   Routine Post Op    OV # 2 DOS 03/27/24 RT PERONEAL TENDON REPAIR AND EXCISION OF SOFT TISSUE MASS. He states he still has to take RX meds for the pain on the medial side of the right foot. Pain scale 9/10. Keeps the foot elevated.    DOS: 03/27/2024 Procedure: Right peroneal tendon repair and excision of soft tissue mass  68 y.o. male returns for post-op check.  States his pain is doing good.  He has been ambulating with a cam boot.  Denies any other acute complaints eager to get his staples out  Review of Systems: Negative except as noted in the HPI. Denies N/V/F/Ch.  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 b.) NSTEMI 2016 -> PCI revealed pat RCA stent; sig dLM Dz with FFR ratio 0.76 and oLCx stenosis; ref to CVTS. c.) 2v CABG 04/21/2015 (LIMA-LAD, SVG-OM2); d.) [facility] 05/02/2018: EF 55-65%; LVEDP 14-15 mmHg; 10% p-m RCA, 30% pRCA, 20% dRCA, 95% o-pLCx, 80% m-dLM; LIMA graft pat. SVG graft with mild diffuse Dz.   DDD (degenerative disc disease), lumbar    Dyspnea    GERD (gastroesophageal reflux disease)    Hyperlipidemia associated with type 2 diabetes mellitus (HCC) 12/27/2020   Hypertension    Left foot drop    Long-term use of aspirin   therapy    NSTEMI (non-ST elevated myocardial infarction) (HCC) 2016   a.) PCI revealed patent RCA stent; significant dLM disease with FFR ratio 0.76 and oLCx stenosis; refer to CVTS for CABG.   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 Swallow whole., Disp: , Rfl:    atorvastatin  (LIPITOR) 80 MG tablet, Take 1 tablet (80 mg total) by mouth daily., Disp: , Rfl:    bisacodyl  (DULCOLAX) 5 MG EC tablet, Take 5 mg by mouth every other day., Disp: , Rfl:    Blood Glucose Monitoring Suppl (FREESTYLE LITE) DEVI, To check blood sugar once daily, Disp: 1 each, Rfl: 0   carvedilol  (COREG ) 12.5 MG tablet, Take 1 tablet (12.5 mg total) by mouth 2 (two) times daily with a meal., Disp: 180 tablet, Rfl: 3   Cholecalciferol (  VITAMIN D3) 50 MCG (2000 UT) capsule, Take 2,000 Units by mouth daily., Disp: , Rfl:    clobetasol  (TEMOVATE ) 0.05 % external solution, Apply 1 Application topically 2 (two) times daily., Disp: 50 mL, Rfl: 11   cyanocobalamin  (VITAMIN B12) 1000 MCG tablet, Take 1,000 mcg by mouth daily., Disp: , Rfl:    cyclobenzaprine  (FLEXERIL ) 10 MG tablet, Take 1 tablet (10 mg total) by mouth 3 (three) times daily as needed for muscle spasms. (Patient taking differently: Take 10 mg by mouth 3 (three) times daily.), Disp: 90 tablet, Rfl: 1   diazepam  (VALIUM ) 2 MG tablet, Take 2 mg by mouth in the morning, at noon, in the evening, and at bedtime., Disp: , Rfl:    ezetimibe  (ZETIA ) 10 MG tablet, TAKE 1 TABLET  DAILY, Disp: 90 tablet, Rfl: 3   FREESTYLE LITE test strip, USE TO CHECK BLOOD SUGAR ONCE DAILY, Disp: 100 strip, Rfl: 3   gemfibrozil  (LOPID ) 600 MG tablet, Take 1 tablet (600 mg total) by mouth 2 (two) times daily before a meal., Disp: 180 tablet, Rfl: 3   ibuprofen (ADVIL) 800 MG tablet, Take 1 tablet (800 mg total) by mouth every 6 (six) hours as needed., Disp: 60 tablet, Rfl: 1   indomethacin  (INDOCIN ) 50 MG capsule, Take 1 capsule (50 mg total) by mouth daily as needed (gout flare). Taking once daily (Patient taking differently: Take 50 mg by mouth daily as needed (gout flare).), Disp: 90 capsule, Rfl: 1   isosorbide  mononitrate (IMDUR ) 60 MG 24 hr tablet, TAKE 1 TABLET DAILY, Disp: 90 tablet, Rfl: 3   JARDIANCE  10 MG TABS tablet, TAKE 1 TABLET DAILY BEFORE BREAKFAST, Disp: 30 tablet, Rfl: 11   ketoconazole  (NIZORAL ) 2 % shampoo, Apply topically 2 (two) times a week., Disp: 120 mL, Rfl: 3   Lancets (FREESTYLE) lancets, USE TO CHECK BLOOD SUGAR ONCE DAILY, Disp: 100 each, Rfl: 4   metFORMIN  (GLUCOPHAGE -XR) 500 MG 24 hr tablet, TAKE 2 TABLETS TWICE A DAY WITH MEALS (NEED FOLLOW UP APPOINTMENT FOR UPDATED LABS AND FURTHER REFILLS), Disp: 120 tablet, Rfl: 11   Multiple Vitamin (MULTIVITAMIN WITH MINERALS) TABS tablet, Take 1 tablet by mouth daily., Disp: , Rfl:    nitroGLYCERIN  (NITROSTAT ) 0.4 MG SL tablet, DISSOLVE 1 TABLET UNDER THE TONGUE EVERY 5 MINUTES AS NEEDED FOR CHEST PAIN, Disp: 25 tablet, Rfl: 5   oxyCODONE -acetaminophen  (PERCOCET) 5-325 MG tablet, Take 1 tablet by mouth every 4 (four) hours as needed for severe pain (pain score 7-10)., Disp: 30 tablet, Rfl: 0   OZEMPIC , 0.25 OR 0.5 MG/DOSE, 2 MG/3ML SOPN, INJECT 0.5 MG UNDER THE SKIN ONCE A WEEK, Disp: 9 mL, Rfl: 3   pantoprazole  (PROTONIX ) 40 MG tablet, TAKE 1 TABLET DAILY, Disp: 90 tablet, Rfl: 3   potassium chloride  (KLOR-CON ) 10 MEQ tablet, TAKE 2 TABLETS TWICE A DAY, Disp: 360 tablet, Rfl: 3   pregabalin  (LYRICA ) 100 MG capsule,  Take 100 mg by mouth 3 (three) times daily., Disp: , Rfl:    Pyrithione Zinc (DERMAZINC SHAMPOO) 2 % SHAM, Apply 1 Dose topically as needed., Disp: 480 mL, Rfl: 0   sacubitril-valsartan (ENTRESTO ) 49-51 MG, Take 1 tablet by mouth 2 (two) times daily., Disp: 180 tablet, Rfl: 3   tamsulosin  (FLOMAX ) 0.4 MG CAPS capsule, Take 1 capsule (0.4 mg total) by mouth daily., Disp: 90 capsule, Rfl: 2   torsemide  (DEMADEX ) 20 MG tablet, Take 2 tablets (40 mg total) by mouth every other day., Disp: 90 tablet, Rfl: 3  Social  History   Tobacco Use  Smoking Status Former   Current packs/day: 0.00   Average packs/day: 0.3 packs/day for 2.0 years (0.5 ttl pk-yrs)   Types: Cigarettes, Pipe, Cigars   Start date: 05/27/2003   Quit date: 05/26/2005   Years since quitting: 18.9  Smokeless Tobacco Former   Types: Chew    Allergies  Allergen Reactions   Ace Inhibitors Other (See Comments)   Colchicine  Other (See Comments)    Palpitations and headache   Lisinopril Cough   Objective:  There were no vitals filed for this visit. There is no height or weight on file to calculate BMI. Constitutional Well developed. Well nourished.  Vascular Foot warm and well perfused. Capillary refill normal to all digits.   Neurologic Normal speech. Oriented to person, place, and time. Epicritic sensation to light touch grossly present bilaterally.  Dermatologic Skin completely epithelialized no signs of dehiscence noted no complication noted.  Good range of motion noted good strength of peroneal tendons noted 4 out of 5  Orthopedic: No further tenderness to palpation noted about the surgical site.   Radiographs: None Assessment:   1. Peroneal tendinitis of lower leg, right   2. Lipoma of right lower extremity   3. Status post foot surgery     Plan:  Patient was evaluated and treated and all questions answered.  S/p foot surgery right -Progressing as expected post-operatively. -XR: See above -WB Status: Begin  weightbearing as tolerated in regular shoes as patient was already weightbearing as tolerated with the boot -Sutures: Removed no clinical signs of dehiscence noted no complication noted. -Medications: None - We will hold off on physical therapy for now.  Encouraged him he can slowly start weightbearing with the boot on with Tri-Lock ankle brace he states understanding will do so  No follow-ups on file.

## 2024-05-06 ENCOUNTER — Other Ambulatory Visit: Payer: Self-pay | Admitting: Family

## 2024-05-06 ENCOUNTER — Other Ambulatory Visit: Payer: Self-pay | Admitting: Family Medicine

## 2024-05-08 ENCOUNTER — Telehealth: Payer: Self-pay | Admitting: Family Medicine

## 2024-05-08 DIAGNOSIS — R600 Localized edema: Secondary | ICD-10-CM

## 2024-05-08 DIAGNOSIS — I5032 Chronic diastolic (congestive) heart failure: Secondary | ICD-10-CM

## 2024-05-08 MED ORDER — POTASSIUM CHLORIDE ER 10 MEQ PO TBCR: 20.0000 meq | EXTENDED_RELEASE_TABLET | Freq: Two times a day (BID) | ORAL | 3 refills | Status: AC

## 2024-05-08 NOTE — Telephone Encounter (Signed)
 [facility] faxed refill request for the following medications:  potassium chloride  (KLOR-CON ) 10 MEQ tablet     Please advise.

## 2024-05-18 ENCOUNTER — Ambulatory Visit: Admitting: Podiatry

## 2024-05-18 DIAGNOSIS — M7671 Peroneal tendinitis, right leg: Secondary | ICD-10-CM

## 2024-05-18 DIAGNOSIS — Z9889 Other specified postprocedural states: Secondary | ICD-10-CM

## 2024-05-18 DIAGNOSIS — D1723 Benign lipomatous neoplasm of skin and subcutaneous tissue of right leg: Secondary | ICD-10-CM

## 2024-05-18 NOTE — Progress Notes (Signed)
 Subjective:  Patient ID: Jonathon Newman, male    DOB: [birth year],  MRN: [id number]  Chief Complaint  Patient presents with   Routine Post Op    DOS 03/27/24 RT PERONEAL TENDON REPAIR AND EXCISION OF SOFT TISSUE MASS    DOS: 03/27/2024 Procedure: Right peroneal tendon repair and excision of soft tissue mass  68 y.o. male returns for post-op check.  He states he is doing good no pain.  He is ambulating in regular shoes.  No restrictions.  Review of Systems: Negative except as noted in the HPI. Denies N/V/F/Ch.  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 b.) NSTEMI 2016 -> PCI revealed pat RCA stent; sig dLM Dz with FFR ratio 0.76 and oLCx stenosis; ref to CVTS. c.) 2v CABG 04/21/2015 (LIMA-LAD, SVG-OM2); d.) [facility] 05/02/2018: EF 55-65%; LVEDP 14-15 mmHg; 10% p-m RCA, 30% pRCA, 20% dRCA, 95% o-pLCx, 80% m-dLM; LIMA graft pat. SVG graft with mild diffuse Dz.   DDD (degenerative disc disease), lumbar    Dyspnea    GERD (gastroesophageal reflux disease)    Hyperlipidemia associated with type 2 diabetes mellitus (HCC) 12/27/2020   Hypertension    Left foot drop    Long-term use of aspirin  therapy    NSTEMI (non-ST elevated myocardial infarction) (HCC) 2016   a.) PCI revealed patent RCA stent; significant dLM disease with FFR ratio 0.76 and oLCx stenosis; refer to CVTS for  CABG.   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 Swallow whole., Disp: , Rfl:    atorvastatin  (LIPITOR) 80 MG tablet, Take 1 tablet (80 mg total) by mouth daily., Disp: , Rfl:    bisacodyl  (DULCOLAX) 5 MG EC tablet, Take 5 mg by mouth every other day., Disp: , Rfl:    Blood Glucose Monitoring Suppl (FREESTYLE LITE) DEVI, To check blood sugar once daily, Disp: 1 each, Rfl: 0   carvedilol  (COREG ) 12.5 MG tablet, Take 1 tablet (12.5 mg total) by mouth 2 (two) times daily with a meal., Disp: 180 tablet, Rfl: 3   Cholecalciferol (VITAMIN D3) 50 MCG (2000 UT) capsule, Take 2,000 Units by mouth daily., Disp: , Rfl:    clobetasol  (TEMOVATE ) 0.05 % external solution, Apply 1 Application topically 2 (two) times daily., Disp: 50 mL, Rfl: 11  cyanocobalamin  (VITAMIN B12) 1000 MCG tablet, Take 1,000 mcg by mouth daily., Disp: , Rfl:    cyclobenzaprine  (FLEXERIL ) 10 MG tablet, Take 1 tablet (10 mg total) by mouth 3 (three) times daily as needed for muscle spasms. (Patient taking differently: Take 10 mg by mouth 3 (three) times daily.), Disp: 90 tablet, Rfl: 1   diazepam  (VALIUM ) 2 MG tablet, Take 2 mg by mouth in the morning, at noon, in the evening, and at bedtime., Disp: , Rfl:    ezetimibe  (ZETIA ) 10 MG tablet, TAKE 1 TABLET DAILY, Disp: 90 tablet, Rfl: 3   FREESTYLE LITE test strip, USE TO CHECK BLOOD SUGAR ONCE DAILY, Disp: 100 strip, Rfl: 3   gemfibrozil  (LOPID ) 600 MG tablet, Take 1 tablet (600 mg total) by  mouth 2 (two) times daily before a meal., Disp: 180 tablet, Rfl: 3   ibuprofen  (ADVIL ) 800 MG tablet, Take 1 tablet (800 mg total) by mouth every 6 (six) hours as needed., Disp: 60 tablet, Rfl: 1   indomethacin  (INDOCIN ) 50 MG capsule, Take 1 capsule (50 mg total) by mouth daily as needed (gout flare). Taking once daily, Disp: 90 capsule, Rfl: 0   isosorbide  mononitrate (IMDUR ) 60 MG 24 hr tablet, TAKE 1 TABLET DAILY, Disp: 90 tablet, Rfl: 3   JARDIANCE  10 MG TABS tablet, TAKE 1 TABLET DAILY BEFORE BREAKFAST, Disp: 30 tablet, Rfl: 11   ketoconazole  (NIZORAL ) 2 % shampoo, Apply topically 2 (two) times a week., Disp: 120 mL, Rfl: 3   Lancets (FREESTYLE) lancets, USE TO CHECK BLOOD SUGAR ONCE DAILY, Disp: 100 each, Rfl: 4   metFORMIN  (GLUCOPHAGE -XR) 500 MG 24 hr tablet, TAKE 2 TABLETS TWICE A DAY WITH MEALS (NEED FOLLOW UP APPOINTMENT FOR UPDATED LABS AND FURTHER REFILLS), Disp: 120 tablet, Rfl: 11   Multiple Vitamin (MULTIVITAMIN WITH MINERALS) TABS tablet, Take 1 tablet by mouth daily., Disp: , Rfl:    nitroGLYCERIN  (NITROSTAT ) 0.4 MG SL tablet, DISSOLVE 1 TABLET UNDER THE TONGUE EVERY 5 MINUTES AS NEEDED FOR CHEST PAIN, Disp: 25 tablet, Rfl: 5   oxyCODONE -acetaminophen  (PERCOCET) 5-325 MG tablet, Take 1 tablet by mouth every 4 (four) hours as needed for severe pain (pain score 7-10)., Disp: 30 tablet, Rfl: 0   OZEMPIC , 0.25 OR 0.5 MG/DOSE, 2 MG/3ML SOPN, INJECT 0.5 MG UNDER THE SKIN ONCE A WEEK, Disp: 9 mL, Rfl: 3   pantoprazole  (PROTONIX ) 40 MG tablet, TAKE 1 TABLET DAILY, Disp: 90 tablet, Rfl: 3   potassium chloride  (KLOR-CON ) 10 MEQ tablet, Take 2 tablets (20 mEq total) by mouth 2 (two) times daily., Disp: 360 tablet, Rfl: 3   pregabalin  (LYRICA ) 100 MG capsule, Take 100 mg by mouth 3 (three) times daily., Disp: , Rfl:    Pyrithione Zinc (DERMAZINC SHAMPOO) 2 % SHAM, Apply 1 Dose topically as needed., Disp: 480 mL, Rfl: 0   sacubitril-valsartan (ENTRESTO ) 49-51 MG, Take 1 tablet by mouth 2 (two)  times daily., Disp: 180 tablet, Rfl: 3   tamsulosin  (FLOMAX ) 0.4 MG CAPS capsule, Take 1 capsule (0.4 mg total) by mouth daily., Disp: 90 capsule, Rfl: 2   torsemide  (DEMADEX ) 20 MG tablet, Take 2 tablets (40 mg total) by mouth every other day., Disp: 90 tablet, Rfl: 3  Social History   Tobacco Use  Smoking Status Former   Current packs/day: 0.00   Average packs/day: 0.3 packs/day for 2.0 years (0.5 ttl pk-yrs)   Types: Cigarettes, Pipe, Cigars   Start date: 05/27/2003   Quit date: 05/26/2005   Years since quitting:  19.0  Smokeless Tobacco Former   Types: Chew    Allergies  Allergen Reactions   Ace Inhibitors Other (See Comments)   Colchicine  Other (See Comments)    Palpitations and headache   Lisinopril Cough   Objective:  There were no vitals filed for this visit. There is no height or weight on file to calculate BMI. Constitutional Well developed. Well nourished.  Vascular Foot warm and well perfused. Capillary refill normal to all digits.   Neurologic Normal speech. Oriented to person, place, and time. Epicritic sensation to light touch grossly present bilaterally.  Dermatologic Skin completely epithelialized no signs of dehiscence noted no complication noted.  Good range of motion noted good strength of peroneal tendons noted 4 out of 5  Orthopedic: No further tenderness to palpation noted about the surgical site.   Radiographs: None Assessment:   1. Peroneal tendinitis of lower leg, right   2. Lipoma of right lower extremity   3. Status post foot surgery      Plan:  Patient was evaluated and treated and all questions answered.  S/p foot surgery right - Clinically healed and officially discharged from my care.  At this time no further concerns noted.  Patient has good strength to the peroneal tendon.  No recurrence of soft tissue mass noted.  I discussed shoe gear modification if any foot and ankle issues on future he will come back and see me.  No follow-ups  on file.

## 2024-06-01 DIAGNOSIS — I2510 Atherosclerotic heart disease of native coronary artery without angina pectoris: Secondary | ICD-10-CM | POA: Diagnosis not present

## 2024-06-01 DIAGNOSIS — R072 Precordial pain: Secondary | ICD-10-CM | POA: Diagnosis present

## 2024-06-01 DIAGNOSIS — J449 Chronic obstructive pulmonary disease, unspecified: Secondary | ICD-10-CM | POA: Diagnosis not present

## 2024-06-01 DIAGNOSIS — E119 Type 2 diabetes mellitus without complications: Secondary | ICD-10-CM | POA: Insufficient documentation

## 2024-06-01 DIAGNOSIS — I10 Essential (primary) hypertension: Secondary | ICD-10-CM | POA: Diagnosis not present

## 2024-06-02 ENCOUNTER — Emergency Department
Admission: EM | Admit: 2024-06-02 | Discharge: 2024-06-02 | Disposition: A | Attending: Emergency Medicine | Admitting: Emergency Medicine

## 2024-06-02 ENCOUNTER — Emergency Department

## 2024-06-02 ENCOUNTER — Other Ambulatory Visit: Payer: Self-pay

## 2024-06-02 DIAGNOSIS — R072 Precordial pain: Secondary | ICD-10-CM | POA: Diagnosis not present

## 2024-06-02 DIAGNOSIS — R079 Chest pain, unspecified: Secondary | ICD-10-CM

## 2024-06-02 DIAGNOSIS — R748 Abnormal levels of other serum enzymes: Secondary | ICD-10-CM

## 2024-06-02 LAB — CBC
HCT: 44.7 % (ref 39.0–52.0)
Hemoglobin: 15.7 g/dL (ref 13.0–17.0)
MCH: 32.6 pg (ref 26.0–34.0)
MCHC: 35.1 g/dL (ref 30.0–36.0)
MCV: 92.9 fL (ref 80.0–100.0)
Platelets: 207 K/uL (ref 150–400)
RBC: 4.81 MIL/uL (ref 4.22–5.81)
RDW: 14.1 % (ref 11.5–15.5)
WBC: 7 K/uL (ref 4.0–10.5)
nRBC: 0 % (ref 0.0–0.2)

## 2024-06-02 LAB — LIPASE, BLOOD: Lipase: 83 U/L — ABNORMAL HIGH (ref 11–51)

## 2024-06-02 LAB — BASIC METABOLIC PANEL WITH GFR
Anion gap: 17 — ABNORMAL HIGH (ref 5–15)
BUN: 21 mg/dL (ref 8–23)
CO2: 19 mmol/L — ABNORMAL LOW (ref 22–32)
Calcium: 10.2 mg/dL (ref 8.9–10.3)
Chloride: 102 mmol/L (ref 98–111)
Creatinine, Ser: 1.16 mg/dL (ref 0.61–1.24)
GFR, Estimated: >60 mL/min
Glucose, Bld: 145 mg/dL — ABNORMAL HIGH (ref 70–99)
Potassium: 4 mmol/L (ref 3.5–5.1)
Sodium: 138 mmol/L (ref 135–145)

## 2024-06-02 LAB — HEPATIC FUNCTION PANEL
ALT: 38 U/L (ref 0–44)
AST: 27 U/L (ref 15–41)
Albumin: 4.8 g/dL (ref 3.5–5.0)
Alkaline Phosphatase: 88 U/L (ref 38–126)
Bilirubin, Direct: 0.1 mg/dL (ref 0.0–0.2)
Indirect Bilirubin: 0.2 mg/dL — ABNORMAL LOW (ref 0.3–0.9)
Total Bilirubin: 0.4 mg/dL (ref 0.0–1.2)
Total Protein: 8 g/dL (ref 6.5–8.1)

## 2024-06-02 LAB — TROPONIN T, HIGH SENSITIVITY
Troponin T High Sensitivity: 18 ng/L (ref 0–19)
Troponin T High Sensitivity: 18 ng/L (ref 0–19)

## 2024-06-02 LAB — CBG MONITORING, ED: Glucose-Capillary: 150 mg/dL — ABNORMAL HIGH (ref 70–99)

## 2024-06-02 MED ORDER — MORPHINE SULFATE (PF) 4 MG/ML IV SOLN
4.0000 mg | Freq: Once | INTRAVENOUS | Status: AC
  Administered 2024-06-02: 4 mg via INTRAVENOUS
  Filled 2024-06-02: qty 1

## 2024-06-02 MED ORDER — IOHEXOL 350 MG/ML SOLN
100.0000 mL | Freq: Once | INTRAVENOUS | Status: AC | PRN
  Administered 2024-06-02: 100 mL via INTRAVENOUS

## 2024-06-02 MED ORDER — ASPIRIN 81 MG PO CHEW
324.0000 mg | CHEWABLE_TABLET | Freq: Once | ORAL | Status: AC
  Administered 2024-06-02: 324 mg via ORAL
  Filled 2024-06-02: qty 4

## 2024-06-02 MED ORDER — CARVEDILOL 6.25 MG PO TABS
12.5000 mg | ORAL_TABLET | Freq: Two times a day (BID) | ORAL | Status: AC
  Administered 2024-06-02: 12.5 mg via ORAL
  Filled 2024-06-02: qty 2

## 2024-06-02 MED ORDER — PANTOPRAZOLE SODIUM 40 MG PO TBEC: 40.0000 mg | DELAYED_RELEASE_TABLET | Freq: Every day | ORAL | 3 refills | Status: AC

## 2024-06-02 MED ORDER — ISOSORBIDE MONONITRATE ER 60 MG PO TB24: 60.0000 mg | ORAL_TABLET | Freq: Every day | ORAL | Status: DC

## 2024-06-02 MED ORDER — ALUM & MAG HYDROXIDE-SIMETH 200-200-20 MG/5ML PO SUSP
30.0000 mL | Freq: Once | ORAL | Status: AC
  Administered 2024-06-02: 30 mL via ORAL
  Filled 2024-06-02: qty 30

## 2024-06-02 MED ORDER — FAMOTIDINE 20 MG PO TABS
20.0000 mg | ORAL_TABLET | Freq: Once | ORAL | Status: AC
  Administered 2024-06-02: 20 mg via ORAL
  Filled 2024-06-02: qty 1

## 2024-06-02 MED ORDER — PANTOPRAZOLE SODIUM 40 MG PO TBEC: 40.0000 mg | DELAYED_RELEASE_TABLET | Freq: Every day | ORAL | 3 refills | Status: DC

## 2024-06-02 MED ORDER — PANTOPRAZOLE SODIUM 40 MG IV SOLR
40.0000 mg | Freq: Once | INTRAVENOUS | Status: AC
  Administered 2024-06-02: 40 mg via INTRAVENOUS
  Filled 2024-06-02: qty 10

## 2024-06-02 MED ORDER — ISOSORBIDE MONONITRATE ER 60 MG PO TB24
60.0000 mg | ORAL_TABLET | Freq: Every day | ORAL | Status: AC
  Administered 2024-06-02: 60 mg via ORAL
  Filled 2024-06-02: qty 1

## 2024-06-02 MED ORDER — LIDOCAINE VISCOUS HCL 2 % MT SOLN
15.0000 mL | Freq: Once | OROMUCOSAL | Status: AC
  Administered 2024-06-02: 15 mL via ORAL
  Filled 2024-06-02: qty 15

## 2024-06-02 NOTE — ED Provider Notes (Signed)
----------------------------------------- °  7:42 AM on 06/02/2024 -----------------------------------------  I took over care on this patient from Dr. Cyrena.  At the time of signout, LFTs and lipase were pending.  LFTs are normal.  Lipase is minimally elevated.  On reassessment, the patient states he is feeling much better.  He appears comfortable.  His blood pressure is improving, with a systolic in the 170s.  He states he drinks alcohol very infrequently, and denies any recent alcohol use.  Although the workup may indicate very mild pancreatitis, the patient is clinically improved.  The CT did not reveal any concerning hepatobiliary findings.  Based on the normal LFTs there is no evidence of biliary obstruction.  At this time, there is no indication for further workup.  There is no indication for admission.  The patient is stable for discharge.  I counseled him on the results of workup including the possibility of very mild pancreatitis.  I have given him instructions on an appropriate diet.  I answered all of his questions, gave strict return precautions, and he expressed understanding.     Jacolyn Pae, MD 06/02/24 [phone]

## 2024-06-02 NOTE — ED Triage Notes (Signed)
 Pt presents for chest pain starting at 1500 today. States he couldn't get his breath, nausea without vomiting. States it feels like someone is sitting on my chest. Pain is midsternal, non-radiating. States this feels the same as previous MI.   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 b.) NSTEMI 2016 -> PCI revealed pat RCA stent; sig dLM Dz with FFR ratio 0.76 and oLCx stenosis; ref to CVTS. c.) 2v CABG 04/21/2015 (LIMA-LAD, SVG-OM2); d.) [facility] 05/02/2018: EF 55-65%; LVEDP 14-15 mmHg; 10% p-m RCA, 30% pRCA, 20% dRCA, 95% o-pLCx, 80% m-dLM; LIMA graft pat. SVG graft with mild diffuse Dz.   DDD (degenerative disc disease), lumbar    Dyspnea    GERD (gastroesophageal reflux disease)    Hyperlipidemia associated with type 2 diabetes mellitus (HCC) 12/27/2020   Hypertension    Left foot drop    Long-term use of aspirin  therapy    NSTEMI (non-ST elevated myocardial infarction) (HCC) 2016   a.) PCI revealed patent RCA stent; significant dLM disease with FFR ratio 0.76 and oLCx stenosis; refer to CVTS for CABG.   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

## 2024-06-02 NOTE — Discharge Instructions (Addendum)
 Fortunately your evaluation in the emergency department did not show any emergency conditions account for the chest pain like dissection of the aorta or heart attack tonight.    Your lipase, which is an enzyme from the pancreas, is slightly elevated.  This could indicate mild pancreatitis, i.e. inflammation of the pancreas.  Usually this is treated with a bland diet and we have attached instructions about this.  You were previously prescribed a medication called Protonix  which can help reduce stomach acid for which I have given you a new prescription for refill.  Take this daily as prescribed and follow-up with your primary doctor.  Give your heart doctor phone call to let them know about the emergency department visit today and schedule follow-up appointment when appropriate.  Thank you for choosing us  for your health care today!  Please see your primary doctor this week for a follow up appointment.   If you have any new, worsening, or unexpected symptoms call your doctor right away or come back to the emergency department for reevaluation.

## 2024-06-02 NOTE — ED Provider Notes (Signed)
 "  [facility] Provider Note    None    (approximate)   History   Chest Pain   HPI  Jonathon Newman is a 68 y.o. male   Past medical history of CAD with CABG, COPD, GERD, OSA on CPAP, type II diabetic, here with chest pain.  Started last evening while at rest.  Substernal with radiation to the mid back.  Pressure-like sensation reminiscent of his anginal symptoms leading to his CABG in the past.  Denies any associated nausea, vomiting, respiratory infectious symptoms, shortness of breath.  He has been dealing with increase of his heartburn symptoms recently, with a burning sensation in his epigastrium rating up into the mouth with sour taste in the mouth occasionally.  He notes that his blood pressure is higher than typical but he has been compliant with his blood pressure medicine  Independent Historian contributed to assessment above: Wife at bedside corroborates information and past medical history as above  External Medical Documents Reviewed: Recent outpatient notes and most recent echo from June 2025 showing normal left ventricular ejection fraction and grade 1 diastolic dysfunction      Physical Exam   Triage Vital Signs: ED Triage Vitals  Encounter Vitals Group     BP 06/01/24 2357 (!) 202/97     Girls Systolic BP Percentile --      Girls Diastolic BP Percentile --      Boys Systolic BP Percentile --      Boys Diastolic BP Percentile --      Pulse Rate 06/01/24 2356 60     Resp 06/01/24 2356 18     Temp 06/01/24 2356 98 F (36.7 C)     Temp Source 06/01/24 2356 Oral     SpO2 06/01/24 2356 97 %     Weight 06/01/24 2356 214 lb (97.1 kg)     Height 06/01/24 2356 5' 7 (1.702 m)     Head Circumference --      Peak Flow --      Pain Score 06/02/24 0003 10     Pain Loc --      Pain Education --      Exclude from Growth Chart --     Most recent vital signs: Vitals:   06/01/24 2357 06/02/24 0314  BP: (!) 202/97 (!) 190/93  Pulse:  61   Resp:  18  Temp:  98 F (36.7 C)  SpO2:  100%    General: Awake, no distress.  CV:  Good peripheral perfusion.  Resp:  Normal effort.  Abd:  No distention.  Other:  Hypertensive otherwise vital signs normal breathing comfortably on room air with clear lungs no focality or wheezing.  Bilateral radial pulses palpated and equal.  No significant peripheral edema.  Soft benign abdominal exam deep palpation all quadrants.   ED Results / Procedures / Treatments   Labs (all labs ordered are listed, but only abnormal results are displayed) Labs Reviewed  BASIC METABOLIC PANEL WITH GFR - Abnormal; Notable for the following components:      Result Value   CO2 19 (*)    Glucose, Bld 145 (*)    Anion gap 17 (*)    All other components within normal limits  CBG MONITORING, ED - Abnormal; Notable for the following components:   Glucose-Capillary 150 (*)    All other components within normal limits  CBC  LIPASE, BLOOD  HEPATIC FUNCTION PANEL  TROPONIN T, HIGH SENSITIVITY  TROPONIN T, HIGH SENSITIVITY  I ordered and reviewed the above labs they are notable for initial troponin normal.  EKG  ED ECG REPORT I, Ginnie Shams, the attending physician, personally viewed and interpreted this ECG.   Date: 06/02/2024  EKG Time: 0000  Rate: 62  Rhythm: nsr  Axis: nl  Intervals:nl  ST&T Change: no stemi    RADIOLOGY I independently reviewed and interpreted chest x-ray and see no obvious focality pneumothorax I also reviewed radiologist's formal read.   PROCEDURES:  Critical Care performed: No  Procedures   MEDICATIONS ORDERED IN ED: Medications  aspirin  chewable tablet 324 mg (324 mg Oral Given 06/02/24 0522)  pantoprazole  (PROTONIX ) injection 40 mg (40 mg Intravenous Given 06/02/24 0537)  alum & mag hydroxide-simeth (MAALOX/MYLANTA) 200-200-20 MG/5ML suspension 30 mL (30 mLs Oral Given 06/02/24 0522)    And  lidocaine  (XYLOCAINE ) 2 % viscous mouth solution 15 mL (15 mLs  Oral Given 06/02/24 0522)  famotidine (PEPCID) tablet 20 mg (20 mg Oral Given 06/02/24 0522)  iohexol  (OMNIPAQUE ) 350 MG/ML injection 100 mL (100 mLs Intravenous Contrast Given 06/02/24 0618)   IMPRESSION / MDM / ASSESSMENT AND PLAN / ED COURSE  I reviewed the triage vital signs and the nursing notes.                                Patient's presentation is most consistent with acute presentation with potential threat to life or bodily function.  Differential diagnosis includes, but is not limited to, ACS, dissection, GERD, pancreatitis, choledocholithiasis, considered but less likely PE   The patient is on the cardiac monitor to evaluate for evidence of arrhythmia and/or significant heart rate changes.  MDM:    68 year old man with substernal chest pain radiating to the back with significant cardiac history concerning for ACS or dissection.  Fortunately nonischemic EKG and initial troponin negative.  Will follow-up with serial troponin.  Give aspirin .  CT angiogram for dissection protocol given the symptomatology with chest pain radiating to the back and marked hypertension.  Consider GERD or dyspepsia as well given increase of his GERD symptoms over the last several days will give GI cocktail.  Not consistent with PE.  Benign abdominal exam rules against surgical abdominal pathologies at this time.  Plan will be for evaluation as above and if unremarkable second troponin and CT angiogram chest abdomen pelvis plan will be to start him on a PPI and have him follow-up with PMD/cardiologist.       FINAL CLINICAL IMPRESSION(S) / ED DIAGNOSES   Final diagnoses:  Nonspecific chest pain     Rx / DC Orders   ED Discharge Orders          Ordered    pantoprazole  (PROTONIX ) 40 MG tablet  Daily        06/02/24 0535             Note:  This document was prepared using Dragon voice recognition software and may include unintentional dictation errors.    Shams Ginnie,  MD 06/02/24 [phone]  "

## 2024-06-12 ENCOUNTER — Encounter: Admitting: Family

## 2024-06-21 ENCOUNTER — Encounter: Payer: Self-pay | Admitting: Urology

## 2024-07-10 ENCOUNTER — Encounter: Payer: Self-pay | Admitting: Family

## 2024-07-11 ENCOUNTER — Other Ambulatory Visit: Payer: Self-pay

## 2024-07-11 DIAGNOSIS — R972 Elevated prostate specific antigen [PSA]: Secondary | ICD-10-CM

## 2024-07-14 ENCOUNTER — Other Ambulatory Visit: Payer: Self-pay | Admitting: Podiatry

## 2024-07-14 ENCOUNTER — Other Ambulatory Visit: Payer: Self-pay | Admitting: Cardiovascular Disease

## 2024-07-14 ENCOUNTER — Other Ambulatory Visit: Payer: Self-pay | Admitting: Family Medicine

## 2024-07-15 ENCOUNTER — Other Ambulatory Visit: Payer: Self-pay | Admitting: Cardiovascular Disease

## 2024-07-25 ENCOUNTER — Other Ambulatory Visit

## 2024-07-26 ENCOUNTER — Ambulatory Visit: Admitting: Urology

## 2024-07-27 ENCOUNTER — Other Ambulatory Visit

## 2024-07-28 ENCOUNTER — Ambulatory Visit: Admitting: Urology
